# Patient Record
Sex: Female | Born: 1965
Health system: Southern US, Community
[De-identification: ages and names within clinical notes are randomized; demographics above are authoritative.]

## PROBLEM LIST (undated history)

## (undated) DIAGNOSIS — J869 Pyothorax without fistula: Secondary | ICD-10-CM

## (undated) DIAGNOSIS — I350 Nonrheumatic aortic (valve) stenosis: Secondary | ICD-10-CM

## (undated) DIAGNOSIS — F411 Generalized anxiety disorder: Secondary | ICD-10-CM

## (undated) DIAGNOSIS — E038 Other specified hypothyroidism: Secondary | ICD-10-CM

## (undated) DIAGNOSIS — M1990 Unspecified osteoarthritis, unspecified site: Secondary | ICD-10-CM

## (undated) DIAGNOSIS — I2699 Other pulmonary embolism without acute cor pulmonale: Secondary | ICD-10-CM

## (undated) DIAGNOSIS — R599 Enlarged lymph nodes, unspecified: Secondary | ICD-10-CM

## (undated) DIAGNOSIS — D509 Iron deficiency anemia, unspecified: Secondary | ICD-10-CM

## (undated) DIAGNOSIS — R609 Edema, unspecified: Secondary | ICD-10-CM

## (undated) DIAGNOSIS — N059 Unspecified nephritic syndrome with unspecified morphologic changes: Secondary | ICD-10-CM

## (undated) DIAGNOSIS — E7800 Pure hypercholesterolemia, unspecified: Secondary | ICD-10-CM

## (undated) DIAGNOSIS — I10 Essential (primary) hypertension: Secondary | ICD-10-CM

## (undated) DIAGNOSIS — I82409 Acute embolism and thrombosis of unspecified deep veins of unspecified lower extremity: Secondary | ICD-10-CM

## (undated) DIAGNOSIS — J90 Pleural effusion, not elsewhere classified: Secondary | ICD-10-CM

## (undated) DIAGNOSIS — G4730 Sleep apnea, unspecified: Secondary | ICD-10-CM

## (undated) DIAGNOSIS — R011 Cardiac murmur, unspecified: Secondary | ICD-10-CM

## (undated) DIAGNOSIS — R945 Abnormal results of liver function studies: Secondary | ICD-10-CM

## (undated) DIAGNOSIS — F410 Panic disorder [episodic paroxysmal anxiety] without agoraphobia: Secondary | ICD-10-CM

## (undated) DIAGNOSIS — M329 Systemic lupus erythematosus, unspecified: Secondary | ICD-10-CM

## (undated) DIAGNOSIS — E669 Obesity, unspecified: Secondary | ICD-10-CM

## (undated) DIAGNOSIS — E785 Hyperlipidemia, unspecified: Secondary | ICD-10-CM

## (undated) DIAGNOSIS — K219 Gastro-esophageal reflux disease without esophagitis: Secondary | ICD-10-CM

## (undated) DIAGNOSIS — Z86718 Personal history of other venous thrombosis and embolism: Secondary | ICD-10-CM

## (undated) HISTORY — DX: Iron deficiency anemia, unspecified: D50.9

## (undated) HISTORY — DX: Pyothorax without fistula: J86.9

## (undated) HISTORY — DX: Unspecified nephritic syndrome with unspecified morphologic changes: N05.9

## (undated) HISTORY — DX: Obesity, unspecified: E66.9

## (undated) HISTORY — DX: Enlarged lymph nodes, unspecified: R59.9

## (undated) HISTORY — DX: Other specified hypothyroidism: E03.8

## (undated) HISTORY — DX: Nonrheumatic aortic (valve) stenosis: I35.0

## (undated) HISTORY — DX: Panic disorder (episodic paroxysmal anxiety): F41.0

## (undated) HISTORY — PX: CARDIAC VALVE REPLACEMENT: SHX585

## (undated) HISTORY — DX: Pleural effusion, not elsewhere classified: J90

## (undated) HISTORY — PX: WISDOM TOOTH EXTRACTION: SHX21

## (undated) HISTORY — DX: Personal history of other venous thrombosis and embolism: Z86.718

## (undated) HISTORY — DX: Other pulmonary embolism without acute cor pulmonale: I26.99

## (undated) HISTORY — DX: Hyperlipidemia, unspecified: E78.5

## (undated) HISTORY — DX: Generalized anxiety disorder: F41.1

## (undated) HISTORY — DX: Essential (primary) hypertension: I10

## (undated) HISTORY — DX: Abnormal results of liver function studies: R94.5

## (undated) HISTORY — DX: Pure hypercholesterolemia, unspecified: E78.00

## (undated) HISTORY — DX: Edema, unspecified: R60.9

## (undated) HISTORY — DX: Systemic lupus erythematosus, unspecified: M32.9

## (undated) HISTORY — DX: Cardiac murmur, unspecified: R01.1

## (undated) HISTORY — PX: OTHER SURGICAL HISTORY: SHX169

## (undated) HISTORY — PX: CARDIAC CATHETERIZATION: SHX172

---

## 1999-09-26 ENCOUNTER — Other Ambulatory Visit: Admission: RE | Admit: 1999-09-26 | Discharge: 1999-09-26 | Payer: Self-pay | Admitting: Family Medicine

## 2000-02-04 DIAGNOSIS — Z86718 Personal history of other venous thrombosis and embolism: Secondary | ICD-10-CM

## 2000-02-04 HISTORY — DX: Personal history of other venous thrombosis and embolism: Z86.718

## 2000-03-05 ENCOUNTER — Other Ambulatory Visit: Admission: RE | Admit: 2000-03-05 | Discharge: 2000-03-05 | Payer: Self-pay | Admitting: *Deleted

## 2000-08-03 ENCOUNTER — Ambulatory Visit (HOSPITAL_COMMUNITY): Admission: RE | Admit: 2000-08-03 | Discharge: 2000-08-03 | Payer: Self-pay | Admitting: *Deleted

## 2000-08-04 ENCOUNTER — Encounter (INDEPENDENT_AMBULATORY_CARE_PROVIDER_SITE_OTHER): Payer: Self-pay | Admitting: *Deleted

## 2000-08-04 ENCOUNTER — Inpatient Hospital Stay (HOSPITAL_COMMUNITY): Admission: AD | Admit: 2000-08-04 | Discharge: 2000-08-08 | Payer: Self-pay | Admitting: *Deleted

## 2000-09-10 ENCOUNTER — Other Ambulatory Visit: Admission: RE | Admit: 2000-09-10 | Discharge: 2000-09-10 | Payer: Self-pay | Admitting: *Deleted

## 2002-05-05 ENCOUNTER — Encounter: Payer: Self-pay | Admitting: Family Medicine

## 2002-05-05 LAB — CONVERTED CEMR LAB

## 2003-02-04 DIAGNOSIS — J90 Pleural effusion, not elsewhere classified: Secondary | ICD-10-CM

## 2003-02-04 HISTORY — DX: Pleural effusion, not elsewhere classified: J90

## 2003-06-07 ENCOUNTER — Inpatient Hospital Stay (HOSPITAL_COMMUNITY): Admission: EM | Admit: 2003-06-07 | Discharge: 2003-06-10 | Payer: Self-pay | Admitting: Emergency Medicine

## 2003-06-08 ENCOUNTER — Encounter: Payer: Self-pay | Admitting: Cardiology

## 2004-03-15 ENCOUNTER — Ambulatory Visit: Payer: Self-pay | Admitting: Family Medicine

## 2004-05-13 ENCOUNTER — Ambulatory Visit: Payer: Self-pay | Admitting: Family Medicine

## 2004-09-04 ENCOUNTER — Ambulatory Visit (HOSPITAL_COMMUNITY): Admission: RE | Admit: 2004-09-04 | Discharge: 2004-09-04 | Payer: Self-pay | Admitting: Nephrology

## 2004-09-06 ENCOUNTER — Encounter (INDEPENDENT_AMBULATORY_CARE_PROVIDER_SITE_OTHER): Payer: Self-pay | Admitting: *Deleted

## 2004-09-06 ENCOUNTER — Ambulatory Visit (HOSPITAL_COMMUNITY): Admission: RE | Admit: 2004-09-06 | Discharge: 2004-09-07 | Payer: Self-pay | Admitting: Nephrology

## 2004-10-04 HISTORY — PX: OTHER SURGICAL HISTORY: SHX169

## 2004-10-11 ENCOUNTER — Ambulatory Visit: Payer: Self-pay | Admitting: Family Medicine

## 2004-10-18 ENCOUNTER — Encounter: Admission: RE | Admit: 2004-10-18 | Discharge: 2004-10-18 | Payer: Self-pay | Admitting: Family Medicine

## 2004-10-22 ENCOUNTER — Ambulatory Visit: Payer: Self-pay | Admitting: Critical Care Medicine

## 2004-10-24 ENCOUNTER — Ambulatory Visit: Payer: Self-pay

## 2004-10-24 ENCOUNTER — Encounter (INDEPENDENT_AMBULATORY_CARE_PROVIDER_SITE_OTHER): Payer: Self-pay | Admitting: Specialist

## 2004-10-24 ENCOUNTER — Ambulatory Visit (HOSPITAL_COMMUNITY): Admission: RE | Admit: 2004-10-24 | Discharge: 2004-10-24 | Payer: Self-pay | Admitting: Critical Care Medicine

## 2004-10-28 ENCOUNTER — Emergency Department (HOSPITAL_COMMUNITY): Admission: EM | Admit: 2004-10-28 | Discharge: 2004-10-28 | Payer: Self-pay | Admitting: Emergency Medicine

## 2004-10-30 ENCOUNTER — Ambulatory Visit: Payer: Self-pay | Admitting: Family Medicine

## 2004-11-14 ENCOUNTER — Ambulatory Visit: Payer: Self-pay | Admitting: Critical Care Medicine

## 2004-12-31 ENCOUNTER — Ambulatory Visit: Payer: Self-pay | Admitting: Family Medicine

## 2005-01-07 ENCOUNTER — Ambulatory Visit: Payer: Self-pay | Admitting: Critical Care Medicine

## 2005-11-11 ENCOUNTER — Ambulatory Visit: Payer: Self-pay | Admitting: Family Medicine

## 2006-03-17 ENCOUNTER — Ambulatory Visit: Payer: Self-pay | Admitting: Family Medicine

## 2006-04-01 ENCOUNTER — Ambulatory Visit: Payer: Self-pay | Admitting: Family Medicine

## 2006-04-01 LAB — CONVERTED CEMR LAB
AST: 17 units/L (ref 0–37)
Direct LDL: 164.6 mg/dL

## 2006-04-06 ENCOUNTER — Ambulatory Visit: Payer: Self-pay | Admitting: Family Medicine

## 2006-04-10 ENCOUNTER — Ambulatory Visit: Payer: Self-pay | Admitting: Critical Care Medicine

## 2006-05-04 ENCOUNTER — Ambulatory Visit (HOSPITAL_BASED_OUTPATIENT_CLINIC_OR_DEPARTMENT_OTHER): Admission: RE | Admit: 2006-05-04 | Discharge: 2006-05-04 | Payer: Self-pay | Admitting: Critical Care Medicine

## 2006-05-12 ENCOUNTER — Encounter: Payer: Self-pay | Admitting: Critical Care Medicine

## 2006-05-12 ENCOUNTER — Ambulatory Visit: Payer: Self-pay | Admitting: Pulmonary Disease

## 2006-05-19 ENCOUNTER — Ambulatory Visit: Payer: Self-pay | Admitting: Pulmonary Disease

## 2006-06-09 ENCOUNTER — Encounter: Payer: Self-pay | Admitting: Family Medicine

## 2006-06-09 ENCOUNTER — Ambulatory Visit: Payer: Self-pay | Admitting: Family Medicine

## 2006-06-09 ENCOUNTER — Encounter: Payer: Self-pay | Admitting: Critical Care Medicine

## 2006-06-09 ENCOUNTER — Ambulatory Visit (HOSPITAL_BASED_OUTPATIENT_CLINIC_OR_DEPARTMENT_OTHER): Admission: RE | Admit: 2006-06-09 | Discharge: 2006-06-09 | Payer: Self-pay | Admitting: Pulmonary Disease

## 2006-06-09 ENCOUNTER — Ambulatory Visit: Payer: Self-pay | Admitting: Pulmonary Disease

## 2006-06-09 DIAGNOSIS — N059 Unspecified nephritic syndrome with unspecified morphologic changes: Secondary | ICD-10-CM | POA: Insufficient documentation

## 2006-06-09 DIAGNOSIS — Z86718 Personal history of other venous thrombosis and embolism: Secondary | ICD-10-CM | POA: Insufficient documentation

## 2006-06-09 DIAGNOSIS — E7800 Pure hypercholesterolemia, unspecified: Secondary | ICD-10-CM | POA: Insufficient documentation

## 2006-06-09 DIAGNOSIS — R945 Abnormal results of liver function studies: Secondary | ICD-10-CM | POA: Insufficient documentation

## 2006-06-09 DIAGNOSIS — F410 Panic disorder [episodic paroxysmal anxiety] without agoraphobia: Secondary | ICD-10-CM | POA: Insufficient documentation

## 2006-06-09 DIAGNOSIS — M329 Systemic lupus erythematosus, unspecified: Secondary | ICD-10-CM | POA: Insufficient documentation

## 2006-06-09 DIAGNOSIS — I10 Essential (primary) hypertension: Secondary | ICD-10-CM | POA: Insufficient documentation

## 2006-06-09 DIAGNOSIS — F411 Generalized anxiety disorder: Secondary | ICD-10-CM | POA: Insufficient documentation

## 2006-06-09 DIAGNOSIS — E039 Hypothyroidism, unspecified: Secondary | ICD-10-CM | POA: Insufficient documentation

## 2006-06-11 ENCOUNTER — Ambulatory Visit: Payer: Self-pay | Admitting: Family Medicine

## 2006-06-17 LAB — CONVERTED CEMR LAB
AST: 17 units/L (ref 0–37)
Cholesterol: 181 mg/dL (ref 0–200)
Free T4: 0.7 ng/dL (ref 0.6–1.6)
TSH: 17.85 microintl units/mL — ABNORMAL HIGH (ref 0.35–5.50)

## 2006-07-17 ENCOUNTER — Encounter (INDEPENDENT_AMBULATORY_CARE_PROVIDER_SITE_OTHER): Payer: Self-pay | Admitting: *Deleted

## 2006-11-30 ENCOUNTER — Ambulatory Visit: Payer: Self-pay | Admitting: Family Medicine

## 2006-11-30 LAB — CONVERTED CEMR LAB
Nitrite: NEGATIVE
Urobilinogen, UA: NEGATIVE

## 2006-12-01 ENCOUNTER — Encounter (INDEPENDENT_AMBULATORY_CARE_PROVIDER_SITE_OTHER): Payer: Self-pay | Admitting: Internal Medicine

## 2006-12-10 ENCOUNTER — Encounter: Payer: Self-pay | Admitting: Family Medicine

## 2006-12-17 ENCOUNTER — Encounter: Payer: Self-pay | Admitting: Family Medicine

## 2007-02-16 ENCOUNTER — Encounter: Payer: Self-pay | Admitting: Family Medicine

## 2007-04-26 ENCOUNTER — Encounter (INDEPENDENT_AMBULATORY_CARE_PROVIDER_SITE_OTHER): Payer: Self-pay | Admitting: *Deleted

## 2007-04-26 ENCOUNTER — Telehealth: Payer: Self-pay | Admitting: Family Medicine

## 2007-06-04 ENCOUNTER — Telehealth: Payer: Self-pay | Admitting: Family Medicine

## 2007-07-07 ENCOUNTER — Telehealth: Payer: Self-pay | Admitting: Family Medicine

## 2007-08-18 ENCOUNTER — Telehealth (INDEPENDENT_AMBULATORY_CARE_PROVIDER_SITE_OTHER): Payer: Self-pay | Admitting: *Deleted

## 2007-09-16 ENCOUNTER — Ambulatory Visit: Payer: Self-pay | Admitting: Family Medicine

## 2007-09-22 ENCOUNTER — Telehealth (INDEPENDENT_AMBULATORY_CARE_PROVIDER_SITE_OTHER): Payer: Self-pay | Admitting: *Deleted

## 2007-10-14 ENCOUNTER — Encounter: Payer: Self-pay | Admitting: Critical Care Medicine

## 2007-10-14 ENCOUNTER — Encounter: Payer: Self-pay | Admitting: Family Medicine

## 2007-12-06 ENCOUNTER — Telehealth: Payer: Self-pay | Admitting: Family Medicine

## 2007-12-06 ENCOUNTER — Encounter: Payer: Self-pay | Admitting: Family Medicine

## 2007-12-08 ENCOUNTER — Ambulatory Visit: Payer: Self-pay | Admitting: Family Medicine

## 2007-12-15 LAB — CONVERTED CEMR LAB
LDL Cholesterol: 145 mg/dL — ABNORMAL HIGH (ref 0–99)
TSH: 0.1 microintl units/mL — ABNORMAL LOW (ref 0.35–5.50)
Total CHOL/HDL Ratio: 9.2
Triglycerides: 117 mg/dL (ref 0–149)

## 2008-02-04 HISTORY — PX: THORACENTESIS: SHX235

## 2008-02-15 ENCOUNTER — Telehealth: Payer: Self-pay | Admitting: Family Medicine

## 2008-04-06 ENCOUNTER — Ambulatory Visit: Payer: Self-pay | Admitting: Internal Medicine

## 2008-04-06 ENCOUNTER — Inpatient Hospital Stay (HOSPITAL_COMMUNITY): Admission: EM | Admit: 2008-04-06 | Discharge: 2008-04-08 | Payer: Self-pay | Admitting: Emergency Medicine

## 2008-04-06 ENCOUNTER — Encounter: Payer: Self-pay | Admitting: Internal Medicine

## 2008-04-06 ENCOUNTER — Ambulatory Visit: Payer: Self-pay | Admitting: Cardiothoracic Surgery

## 2008-04-06 DIAGNOSIS — J869 Pyothorax without fistula: Secondary | ICD-10-CM | POA: Insufficient documentation

## 2008-04-07 ENCOUNTER — Encounter: Payer: Self-pay | Admitting: Family Medicine

## 2008-04-07 ENCOUNTER — Encounter (INDEPENDENT_AMBULATORY_CARE_PROVIDER_SITE_OTHER): Payer: Self-pay | Admitting: Interventional Radiology

## 2008-04-08 ENCOUNTER — Encounter: Payer: Self-pay | Admitting: Family Medicine

## 2008-04-12 ENCOUNTER — Ambulatory Visit: Payer: Self-pay | Admitting: Family Medicine

## 2008-04-14 LAB — CONVERTED CEMR LAB
Free T4: 1.4 ng/dL (ref 0.6–1.6)
TSH: 0.06 microintl units/mL — ABNORMAL LOW (ref 0.35–5.50)

## 2008-04-17 ENCOUNTER — Encounter: Payer: Self-pay | Admitting: Family Medicine

## 2008-04-17 ENCOUNTER — Ambulatory Visit: Payer: Self-pay | Admitting: Critical Care Medicine

## 2008-04-21 ENCOUNTER — Ambulatory Visit: Payer: Self-pay | Admitting: Cardiothoracic Surgery

## 2008-04-21 ENCOUNTER — Encounter: Payer: Self-pay | Admitting: Family Medicine

## 2008-04-21 ENCOUNTER — Emergency Department (HOSPITAL_COMMUNITY): Admission: EM | Admit: 2008-04-21 | Discharge: 2008-04-21 | Payer: Self-pay | Admitting: Emergency Medicine

## 2008-04-21 ENCOUNTER — Encounter: Admission: RE | Admit: 2008-04-21 | Discharge: 2008-04-21 | Payer: Self-pay | Admitting: Cardiothoracic Surgery

## 2008-04-24 ENCOUNTER — Encounter: Payer: Self-pay | Admitting: Family Medicine

## 2008-04-25 ENCOUNTER — Ambulatory Visit: Payer: Self-pay | Admitting: Family Medicine

## 2008-04-26 ENCOUNTER — Telehealth (INDEPENDENT_AMBULATORY_CARE_PROVIDER_SITE_OTHER): Payer: Self-pay | Admitting: *Deleted

## 2008-05-04 ENCOUNTER — Encounter: Payer: Self-pay | Admitting: Critical Care Medicine

## 2008-05-04 ENCOUNTER — Encounter: Admission: RE | Admit: 2008-05-04 | Discharge: 2008-05-04 | Payer: Self-pay | Admitting: Cardiothoracic Surgery

## 2008-05-04 ENCOUNTER — Ambulatory Visit: Payer: Self-pay | Admitting: Cardiothoracic Surgery

## 2008-05-10 ENCOUNTER — Encounter: Payer: Self-pay | Admitting: Critical Care Medicine

## 2008-05-18 ENCOUNTER — Ambulatory Visit: Payer: Self-pay | Admitting: Cardiothoracic Surgery

## 2008-05-24 ENCOUNTER — Inpatient Hospital Stay (HOSPITAL_COMMUNITY): Admission: RE | Admit: 2008-05-24 | Discharge: 2008-05-30 | Payer: Self-pay | Admitting: Cardiothoracic Surgery

## 2008-05-24 ENCOUNTER — Encounter: Payer: Self-pay | Admitting: Cardiothoracic Surgery

## 2008-05-24 ENCOUNTER — Encounter (INDEPENDENT_AMBULATORY_CARE_PROVIDER_SITE_OTHER): Payer: Self-pay | Admitting: Interventional Radiology

## 2008-05-24 ENCOUNTER — Ambulatory Visit: Payer: Self-pay | Admitting: Cardiothoracic Surgery

## 2008-06-07 ENCOUNTER — Encounter (INDEPENDENT_AMBULATORY_CARE_PROVIDER_SITE_OTHER): Payer: Self-pay | Admitting: *Deleted

## 2008-06-16 ENCOUNTER — Ambulatory Visit: Payer: Self-pay | Admitting: Cardiothoracic Surgery

## 2008-06-16 ENCOUNTER — Encounter: Admission: RE | Admit: 2008-06-16 | Discharge: 2008-06-16 | Payer: Self-pay | Admitting: Cardiothoracic Surgery

## 2008-06-30 ENCOUNTER — Ambulatory Visit: Payer: Self-pay | Admitting: Cardiothoracic Surgery

## 2008-06-30 ENCOUNTER — Encounter: Payer: Self-pay | Admitting: Critical Care Medicine

## 2008-06-30 ENCOUNTER — Encounter: Admission: RE | Admit: 2008-06-30 | Discharge: 2008-06-30 | Payer: Self-pay | Admitting: Cardiothoracic Surgery

## 2008-07-18 ENCOUNTER — Ambulatory Visit: Payer: Self-pay | Admitting: Family Medicine

## 2008-07-20 ENCOUNTER — Ambulatory Visit: Payer: Self-pay | Admitting: Critical Care Medicine

## 2008-08-22 ENCOUNTER — Encounter: Payer: Self-pay | Admitting: Critical Care Medicine

## 2008-09-11 ENCOUNTER — Encounter (INDEPENDENT_AMBULATORY_CARE_PROVIDER_SITE_OTHER): Payer: Self-pay | Admitting: *Deleted

## 2008-12-19 ENCOUNTER — Telehealth: Payer: Self-pay | Admitting: Family Medicine

## 2008-12-20 ENCOUNTER — Encounter: Payer: Self-pay | Admitting: Critical Care Medicine

## 2008-12-21 ENCOUNTER — Telehealth (INDEPENDENT_AMBULATORY_CARE_PROVIDER_SITE_OTHER): Payer: Self-pay | Admitting: *Deleted

## 2008-12-25 ENCOUNTER — Ambulatory Visit: Payer: Self-pay | Admitting: Family Medicine

## 2009-01-03 LAB — CONVERTED CEMR LAB
Cholesterol: 231 mg/dL — ABNORMAL HIGH (ref 0–200)
TSH: 4.38 microintl units/mL (ref 0.35–5.50)
Total CHOL/HDL Ratio: 7
VLDL: 23 mg/dL (ref 0.0–40.0)

## 2009-04-18 ENCOUNTER — Emergency Department (HOSPITAL_BASED_OUTPATIENT_CLINIC_OR_DEPARTMENT_OTHER): Admission: EM | Admit: 2009-04-18 | Discharge: 2009-04-18 | Payer: Self-pay | Admitting: Emergency Medicine

## 2009-04-18 ENCOUNTER — Ambulatory Visit: Payer: Self-pay | Admitting: Radiology

## 2009-05-08 ENCOUNTER — Ambulatory Visit: Payer: Self-pay | Admitting: Family Medicine

## 2009-05-08 ENCOUNTER — Encounter: Admission: RE | Admit: 2009-05-08 | Discharge: 2009-05-08 | Payer: Self-pay | Admitting: Family Medicine

## 2009-05-11 ENCOUNTER — Telehealth: Payer: Self-pay | Admitting: Family Medicine

## 2009-05-14 ENCOUNTER — Telehealth: Payer: Self-pay | Admitting: Family Medicine

## 2009-05-16 ENCOUNTER — Inpatient Hospital Stay (HOSPITAL_COMMUNITY): Admission: EM | Admit: 2009-05-16 | Discharge: 2009-05-17 | Payer: Self-pay | Admitting: Internal Medicine

## 2009-05-16 ENCOUNTER — Encounter: Payer: Self-pay | Admitting: Emergency Medicine

## 2009-05-16 ENCOUNTER — Ambulatory Visit: Payer: Self-pay | Admitting: Diagnostic Radiology

## 2009-05-17 ENCOUNTER — Encounter (INDEPENDENT_AMBULATORY_CARE_PROVIDER_SITE_OTHER): Payer: Self-pay | Admitting: Internal Medicine

## 2009-05-17 ENCOUNTER — Ambulatory Visit: Payer: Self-pay | Admitting: Surgery

## 2009-05-18 ENCOUNTER — Telehealth: Payer: Self-pay | Admitting: Family Medicine

## 2009-05-19 ENCOUNTER — Encounter: Payer: Self-pay | Admitting: Family Medicine

## 2009-05-22 LAB — CONVERTED CEMR LAB: INR: 1.01 (ref <1.50)

## 2009-05-24 ENCOUNTER — Encounter: Payer: Self-pay | Admitting: Family Medicine

## 2009-05-24 ENCOUNTER — Ambulatory Visit: Payer: Self-pay | Admitting: Family Medicine

## 2009-05-24 DIAGNOSIS — Z86711 Personal history of pulmonary embolism: Secondary | ICD-10-CM | POA: Insufficient documentation

## 2009-05-27 ENCOUNTER — Emergency Department (HOSPITAL_COMMUNITY): Admission: EM | Admit: 2009-05-27 | Discharge: 2009-05-27 | Payer: Self-pay | Admitting: Emergency Medicine

## 2009-05-28 ENCOUNTER — Telehealth: Payer: Self-pay | Admitting: Family Medicine

## 2009-05-28 ENCOUNTER — Telehealth: Payer: Self-pay | Admitting: Critical Care Medicine

## 2009-05-29 ENCOUNTER — Ambulatory Visit: Payer: Self-pay | Admitting: Critical Care Medicine

## 2009-05-29 ENCOUNTER — Ambulatory Visit: Payer: Self-pay | Admitting: Family Medicine

## 2009-05-29 LAB — CONVERTED CEMR LAB
INR: 1.7
Prothrombin Time: 21 s

## 2009-06-01 ENCOUNTER — Ambulatory Visit: Payer: Self-pay | Admitting: Family Medicine

## 2009-06-01 LAB — CONVERTED CEMR LAB: Prothrombin Time: 34.6 s

## 2009-06-04 ENCOUNTER — Encounter: Payer: Self-pay | Admitting: Family Medicine

## 2009-06-06 ENCOUNTER — Telehealth: Payer: Self-pay | Admitting: Critical Care Medicine

## 2009-06-06 ENCOUNTER — Telehealth: Payer: Self-pay | Admitting: Family Medicine

## 2009-06-07 ENCOUNTER — Encounter: Payer: Self-pay | Admitting: Critical Care Medicine

## 2009-06-11 ENCOUNTER — Ambulatory Visit: Payer: Self-pay | Admitting: Family Medicine

## 2009-06-11 ENCOUNTER — Telehealth: Payer: Self-pay | Admitting: Family Medicine

## 2009-06-11 LAB — CONVERTED CEMR LAB: Prothrombin Time: 30.1 s

## 2009-06-13 ENCOUNTER — Telehealth: Payer: Self-pay | Admitting: Family Medicine

## 2009-06-15 ENCOUNTER — Telehealth: Payer: Self-pay | Admitting: Family Medicine

## 2009-06-15 ENCOUNTER — Telehealth: Payer: Self-pay | Admitting: Critical Care Medicine

## 2009-06-18 ENCOUNTER — Encounter: Payer: Self-pay | Admitting: Family Medicine

## 2009-06-20 ENCOUNTER — Encounter: Payer: Self-pay | Admitting: Family Medicine

## 2009-06-20 ENCOUNTER — Encounter: Payer: Self-pay | Admitting: Critical Care Medicine

## 2009-06-20 ENCOUNTER — Telehealth: Payer: Self-pay | Admitting: Family Medicine

## 2009-07-09 ENCOUNTER — Ambulatory Visit: Payer: Self-pay | Admitting: Family Medicine

## 2009-07-12 ENCOUNTER — Encounter: Payer: Self-pay | Admitting: Family Medicine

## 2009-07-12 LAB — CONVERTED CEMR LAB
ALT: 29 units/L (ref 0–35)
AST: 17 units/L (ref 0–37)
Albumin: 3.4 g/dL — ABNORMAL LOW (ref 3.5–5.2)
BUN: 22 mg/dL (ref 6–23)
CO2: 32 meq/L (ref 19–32)
Calcium: 9.2 mg/dL (ref 8.4–10.5)
Chloride: 105 meq/L (ref 96–112)
Cholesterol: 300 mg/dL — ABNORMAL HIGH (ref 0–200)
Creatinine, Ser: 0.8 mg/dL (ref 0.4–1.2)
Direct LDL: 241.9 mg/dL
GFR calc non Af Amer: 79.77 mL/min (ref >60)
Glucose, Bld: 97 mg/dL (ref 70–99)
HDL: 49.6 mg/dL (ref >39.00)
Phosphorus: 4.4 mg/dL (ref 2.3–4.6)
Potassium: 5.3 meq/L — ABNORMAL HIGH (ref 3.5–5.1)
Sodium: 142 meq/L (ref 135–145)
TSH: 0.45 microintl units/mL (ref 0.35–5.50)
Total CHOL/HDL Ratio: 6
Triglycerides: 110 mg/dL (ref 0.0–149.0)
VLDL: 22 mg/dL (ref 0.0–40.0)

## 2009-07-16 ENCOUNTER — Ambulatory Visit: Payer: Self-pay | Admitting: Family Medicine

## 2009-07-17 ENCOUNTER — Ambulatory Visit (HOSPITAL_COMMUNITY): Admission: RE | Admit: 2009-07-17 | Discharge: 2009-07-17 | Payer: Self-pay | Admitting: Critical Care Medicine

## 2009-07-20 ENCOUNTER — Encounter: Payer: Self-pay | Admitting: Family Medicine

## 2009-07-20 ENCOUNTER — Ambulatory Visit (HOSPITAL_COMMUNITY): Admission: RE | Admit: 2009-07-20 | Discharge: 2009-07-20 | Payer: Self-pay | Admitting: Family Medicine

## 2009-07-31 ENCOUNTER — Telehealth: Payer: Self-pay | Admitting: Family Medicine

## 2009-08-11 ENCOUNTER — Ambulatory Visit: Payer: Self-pay | Admitting: Interventional Radiology

## 2009-08-11 ENCOUNTER — Encounter: Payer: Self-pay | Admitting: Family Medicine

## 2009-08-11 ENCOUNTER — Emergency Department (HOSPITAL_BASED_OUTPATIENT_CLINIC_OR_DEPARTMENT_OTHER): Admission: EM | Admit: 2009-08-11 | Discharge: 2009-08-11 | Payer: Self-pay | Admitting: Emergency Medicine

## 2009-08-13 ENCOUNTER — Ambulatory Visit: Payer: Self-pay | Admitting: Family Medicine

## 2009-08-13 ENCOUNTER — Telehealth: Payer: Self-pay | Admitting: Family Medicine

## 2009-08-13 DIAGNOSIS — R599 Enlarged lymph nodes, unspecified: Secondary | ICD-10-CM | POA: Insufficient documentation

## 2009-08-13 LAB — CONVERTED CEMR LAB
INR: 1.5
Prothrombin Time: 17.4 s

## 2009-08-15 ENCOUNTER — Ambulatory Visit: Payer: Self-pay | Admitting: Critical Care Medicine

## 2009-08-17 ENCOUNTER — Telehealth: Payer: Self-pay | Admitting: Family Medicine

## 2009-08-17 ENCOUNTER — Ambulatory Visit: Payer: Self-pay | Admitting: Family Medicine

## 2009-08-17 LAB — CONVERTED CEMR LAB: Prothrombin Time: 30 s

## 2009-08-20 ENCOUNTER — Encounter: Payer: Self-pay | Admitting: Critical Care Medicine

## 2009-08-23 ENCOUNTER — Telehealth (INDEPENDENT_AMBULATORY_CARE_PROVIDER_SITE_OTHER): Payer: Self-pay | Admitting: *Deleted

## 2009-08-24 ENCOUNTER — Ambulatory Visit: Payer: Self-pay | Admitting: Family Medicine

## 2009-08-24 ENCOUNTER — Telehealth (INDEPENDENT_AMBULATORY_CARE_PROVIDER_SITE_OTHER): Payer: Self-pay | Admitting: *Deleted

## 2009-08-24 LAB — CONVERTED CEMR LAB
INR: 2.9
Prothrombin Time: 34.8 s

## 2009-08-27 ENCOUNTER — Encounter: Payer: Self-pay | Admitting: Internal Medicine

## 2009-08-27 ENCOUNTER — Ambulatory Visit: Payer: Self-pay | Admitting: Internal Medicine

## 2009-08-27 ENCOUNTER — Emergency Department (HOSPITAL_BASED_OUTPATIENT_CLINIC_OR_DEPARTMENT_OTHER): Admission: EM | Admit: 2009-08-27 | Discharge: 2009-08-27 | Payer: Self-pay | Admitting: Emergency Medicine

## 2009-08-27 ENCOUNTER — Ambulatory Visit: Payer: Self-pay | Admitting: Radiology

## 2009-08-27 DIAGNOSIS — J90 Pleural effusion, not elsewhere classified: Secondary | ICD-10-CM | POA: Insufficient documentation

## 2009-08-31 ENCOUNTER — Telehealth (INDEPENDENT_AMBULATORY_CARE_PROVIDER_SITE_OTHER): Payer: Self-pay | Admitting: *Deleted

## 2009-08-31 ENCOUNTER — Encounter: Payer: Self-pay | Admitting: Pulmonary Disease

## 2009-08-31 ENCOUNTER — Ambulatory Visit: Payer: Self-pay | Admitting: Internal Medicine

## 2009-09-03 ENCOUNTER — Ambulatory Visit: Payer: Self-pay | Admitting: Internal Medicine

## 2009-09-04 LAB — CONVERTED CEMR LAB
BUN: 13 mg/dL (ref 6–23)
Bilirubin, Direct: 0.1 mg/dL (ref 0.0–0.3)
Calcium: 8.7 mg/dL (ref 8.4–10.5)
Chloride: 103 meq/L (ref 96–112)
Creatinine, Ser: 0.9 mg/dL (ref 0.4–1.2)
Eosinophils Absolute: 0.1 10*3/uL (ref 0.0–0.7)
Eosinophils Relative: 1.5 % (ref 0.0–5.0)
MCHC: 33 g/dL (ref 30.0–36.0)
MCV: 81.9 fL (ref 78.0–100.0)
Monocytes Absolute: 0.5 10*3/uL (ref 0.1–1.0)
Neutrophils Relative %: 78.7 % — ABNORMAL HIGH (ref 43.0–77.0)
Platelets: 327 10*3/uL (ref 150.0–400.0)
Pro B Natriuretic peptide (BNP): 24.9 pg/mL (ref 0.0–100.0)
Sed Rate: 35 mm/hr — ABNORMAL HIGH (ref 0–22)
Total Bilirubin: 0.7 mg/dL (ref 0.3–1.2)
WBC: 9.8 10*3/uL (ref 4.5–10.5)

## 2009-09-07 ENCOUNTER — Ambulatory Visit: Payer: Self-pay | Admitting: Family Medicine

## 2009-09-07 LAB — CONVERTED CEMR LAB: INR: 1.9

## 2009-09-11 ENCOUNTER — Telehealth: Payer: Self-pay | Admitting: Internal Medicine

## 2009-09-14 ENCOUNTER — Ambulatory Visit: Payer: Self-pay | Admitting: Critical Care Medicine

## 2009-09-14 ENCOUNTER — Ambulatory Visit: Payer: Self-pay | Admitting: Family Medicine

## 2009-09-14 LAB — CONVERTED CEMR LAB
ANA Titer 1: 1:320 {titer} — ABNORMAL HIGH
Anti Nuclear Antibody(ANA): POSITIVE — AB
Basophils Absolute: 0.1 10*3/uL (ref 0.0–0.1)
Eosinophils Absolute: 0.2 10*3/uL (ref 0.0–0.7)
Lymphocytes Relative: 19.8 % (ref 12.0–46.0)
MCHC: 33.3 g/dL (ref 30.0–36.0)
Neutrophils Relative %: 71.2 % (ref 43.0–77.0)
Prothrombin Time: 22.9 s
RDW: 16.9 % — ABNORMAL HIGH (ref 11.5–14.6)

## 2009-09-16 ENCOUNTER — Telehealth: Payer: Self-pay | Admitting: Critical Care Medicine

## 2009-09-17 ENCOUNTER — Telehealth: Payer: Self-pay | Admitting: Family Medicine

## 2009-09-17 ENCOUNTER — Ambulatory Visit (HOSPITAL_COMMUNITY): Admission: RE | Admit: 2009-09-17 | Discharge: 2009-09-17 | Payer: Self-pay | Admitting: Critical Care Medicine

## 2009-09-19 ENCOUNTER — Ambulatory Visit: Payer: Self-pay | Admitting: Family Medicine

## 2009-09-20 ENCOUNTER — Ambulatory Visit (HOSPITAL_BASED_OUTPATIENT_CLINIC_OR_DEPARTMENT_OTHER): Admission: RE | Admit: 2009-09-20 | Discharge: 2009-09-20 | Payer: Self-pay | Admitting: Critical Care Medicine

## 2009-09-20 ENCOUNTER — Ambulatory Visit: Payer: Self-pay | Admitting: Diagnostic Radiology

## 2009-09-20 ENCOUNTER — Ambulatory Visit: Payer: Self-pay | Admitting: Critical Care Medicine

## 2009-09-21 ENCOUNTER — Telehealth (INDEPENDENT_AMBULATORY_CARE_PROVIDER_SITE_OTHER): Payer: Self-pay | Admitting: *Deleted

## 2009-09-21 ENCOUNTER — Telehealth: Payer: Self-pay | Admitting: Critical Care Medicine

## 2009-09-24 ENCOUNTER — Telehealth (INDEPENDENT_AMBULATORY_CARE_PROVIDER_SITE_OTHER): Payer: Self-pay | Admitting: *Deleted

## 2009-09-25 ENCOUNTER — Telehealth: Payer: Self-pay | Admitting: Critical Care Medicine

## 2009-09-26 ENCOUNTER — Ambulatory Visit: Payer: Self-pay | Admitting: Cardiothoracic Surgery

## 2009-09-26 ENCOUNTER — Encounter: Admission: RE | Admit: 2009-09-26 | Discharge: 2009-09-26 | Payer: Self-pay | Admitting: Cardiothoracic Surgery

## 2009-09-27 ENCOUNTER — Ambulatory Visit (HOSPITAL_COMMUNITY): Admission: RE | Admit: 2009-09-27 | Discharge: 2009-09-27 | Payer: Self-pay | Admitting: Cardiothoracic Surgery

## 2009-09-27 ENCOUNTER — Encounter: Payer: Self-pay | Admitting: Cardiothoracic Surgery

## 2009-09-27 ENCOUNTER — Encounter: Payer: Self-pay | Admitting: Critical Care Medicine

## 2009-10-01 ENCOUNTER — Encounter: Payer: Self-pay | Admitting: Cardiothoracic Surgery

## 2009-10-01 ENCOUNTER — Inpatient Hospital Stay (HOSPITAL_COMMUNITY): Admission: RE | Admit: 2009-10-01 | Discharge: 2009-10-09 | Payer: Self-pay | Admitting: Cardiothoracic Surgery

## 2009-10-01 ENCOUNTER — Ambulatory Visit: Payer: Self-pay | Admitting: Internal Medicine

## 2009-10-01 ENCOUNTER — Ambulatory Visit: Payer: Self-pay | Admitting: Cardiothoracic Surgery

## 2009-10-02 ENCOUNTER — Encounter: Payer: Self-pay | Admitting: Internal Medicine

## 2009-10-04 HISTORY — PX: OTHER SURGICAL HISTORY: SHX169

## 2009-10-11 ENCOUNTER — Ambulatory Visit: Payer: Self-pay | Admitting: Family Medicine

## 2009-10-11 LAB — CONVERTED CEMR LAB: Prothrombin Time: 27.5 s

## 2009-10-17 ENCOUNTER — Encounter: Admission: RE | Admit: 2009-10-17 | Discharge: 2009-10-17 | Payer: Self-pay | Admitting: Cardiothoracic Surgery

## 2009-10-17 ENCOUNTER — Ambulatory Visit: Payer: Self-pay | Admitting: Cardiothoracic Surgery

## 2009-10-18 ENCOUNTER — Encounter: Payer: Self-pay | Admitting: Critical Care Medicine

## 2009-10-19 ENCOUNTER — Ambulatory Visit: Payer: Self-pay | Admitting: Family Medicine

## 2009-10-19 LAB — CONVERTED CEMR LAB: Prothrombin Time: 65.9 s

## 2009-10-22 ENCOUNTER — Ambulatory Visit: Payer: Self-pay | Admitting: Family Medicine

## 2009-10-22 ENCOUNTER — Ambulatory Visit: Payer: Self-pay | Admitting: Critical Care Medicine

## 2009-10-22 LAB — CONVERTED CEMR LAB: INR: 2.3

## 2009-10-24 ENCOUNTER — Ambulatory Visit: Payer: Self-pay | Admitting: Cardiothoracic Surgery

## 2009-10-24 ENCOUNTER — Encounter: Admission: RE | Admit: 2009-10-24 | Discharge: 2009-10-24 | Payer: Self-pay | Admitting: Cardiothoracic Surgery

## 2009-10-26 ENCOUNTER — Ambulatory Visit (HOSPITAL_COMMUNITY): Admission: RE | Admit: 2009-10-26 | Discharge: 2009-10-26 | Payer: Self-pay | Admitting: Cardiothoracic Surgery

## 2009-10-26 ENCOUNTER — Ambulatory Visit: Payer: Self-pay | Admitting: Family Medicine

## 2009-10-26 ENCOUNTER — Telehealth (INDEPENDENT_AMBULATORY_CARE_PROVIDER_SITE_OTHER): Payer: Self-pay | Admitting: *Deleted

## 2009-10-26 LAB — CONVERTED CEMR LAB
INR: 2.8
Prothrombin Time: 33.5 s

## 2009-11-02 ENCOUNTER — Ambulatory Visit: Payer: Self-pay | Admitting: Cardiothoracic Surgery

## 2009-11-02 ENCOUNTER — Encounter: Admission: RE | Admit: 2009-11-02 | Discharge: 2009-11-02 | Payer: Self-pay | Admitting: Cardiothoracic Surgery

## 2009-11-08 ENCOUNTER — Encounter: Payer: Self-pay | Admitting: Family Medicine

## 2009-11-15 ENCOUNTER — Telehealth (INDEPENDENT_AMBULATORY_CARE_PROVIDER_SITE_OTHER): Payer: Self-pay | Admitting: *Deleted

## 2009-11-19 ENCOUNTER — Ambulatory Visit: Payer: Self-pay | Admitting: Internal Medicine

## 2009-11-19 ENCOUNTER — Encounter: Payer: Self-pay | Admitting: Adult Health

## 2009-11-19 ENCOUNTER — Ambulatory Visit: Payer: Self-pay | Admitting: Critical Care Medicine

## 2009-11-19 ENCOUNTER — Ambulatory Visit: Payer: Self-pay | Admitting: Cardiothoracic Surgery

## 2009-11-20 ENCOUNTER — Encounter: Payer: Self-pay | Admitting: Critical Care Medicine

## 2009-11-20 LAB — CONVERTED CEMR LAB
BUN: 12 mg/dL (ref 6–23)
Basophils Absolute: 0.1 10*3/uL (ref 0.0–0.1)
CO2: 26 meq/L (ref 19–32)
Calcium: 9 mg/dL (ref 8.4–10.5)
Creatinine, Ser: 0.9 mg/dL (ref 0.4–1.2)
Eosinophils Absolute: 0.1 10*3/uL (ref 0.0–0.7)
Eosinophils Relative: 0.6 % (ref 0.0–5.0)
INR: 1.6 — ABNORMAL HIGH (ref 0.8–1.0)
MCHC: 32.7 g/dL (ref 30.0–36.0)
MCV: 80.8 fL (ref 78.0–100.0)
Monocytes Absolute: 0.3 10*3/uL (ref 0.1–1.0)
Neutrophils Relative %: 83.6 % — ABNORMAL HIGH (ref 43.0–77.0)
Platelets: 271 10*3/uL (ref 150.0–400.0)
Prothrombin Time: 16.6 s — ABNORMAL HIGH (ref 9.7–11.8)
RDW: 16.3 % — ABNORMAL HIGH (ref 11.5–14.6)
WBC: 8.6 10*3/uL (ref 4.5–10.5)

## 2009-11-22 ENCOUNTER — Ambulatory Visit: Payer: Self-pay | Admitting: Critical Care Medicine

## 2009-11-23 ENCOUNTER — Ambulatory Visit: Payer: Self-pay | Admitting: Family Medicine

## 2009-11-30 ENCOUNTER — Encounter (HOSPITAL_COMMUNITY)
Admission: RE | Admit: 2009-11-30 | Discharge: 2010-02-28 | Payer: Self-pay | Source: Home / Self Care | Attending: Nephrology | Admitting: Nephrology

## 2009-12-04 ENCOUNTER — Ambulatory Visit: Payer: Self-pay | Admitting: Family Medicine

## 2009-12-06 IMAGING — CR DG CHEST 2V
2 series · 2 of 2 positions shown · non-contrast
Comparison: 05/30/2008

CLINICAL DATA: Empyema.  Status post chest tube removal.

CHEST - 2 VIEW

[w chest pa]
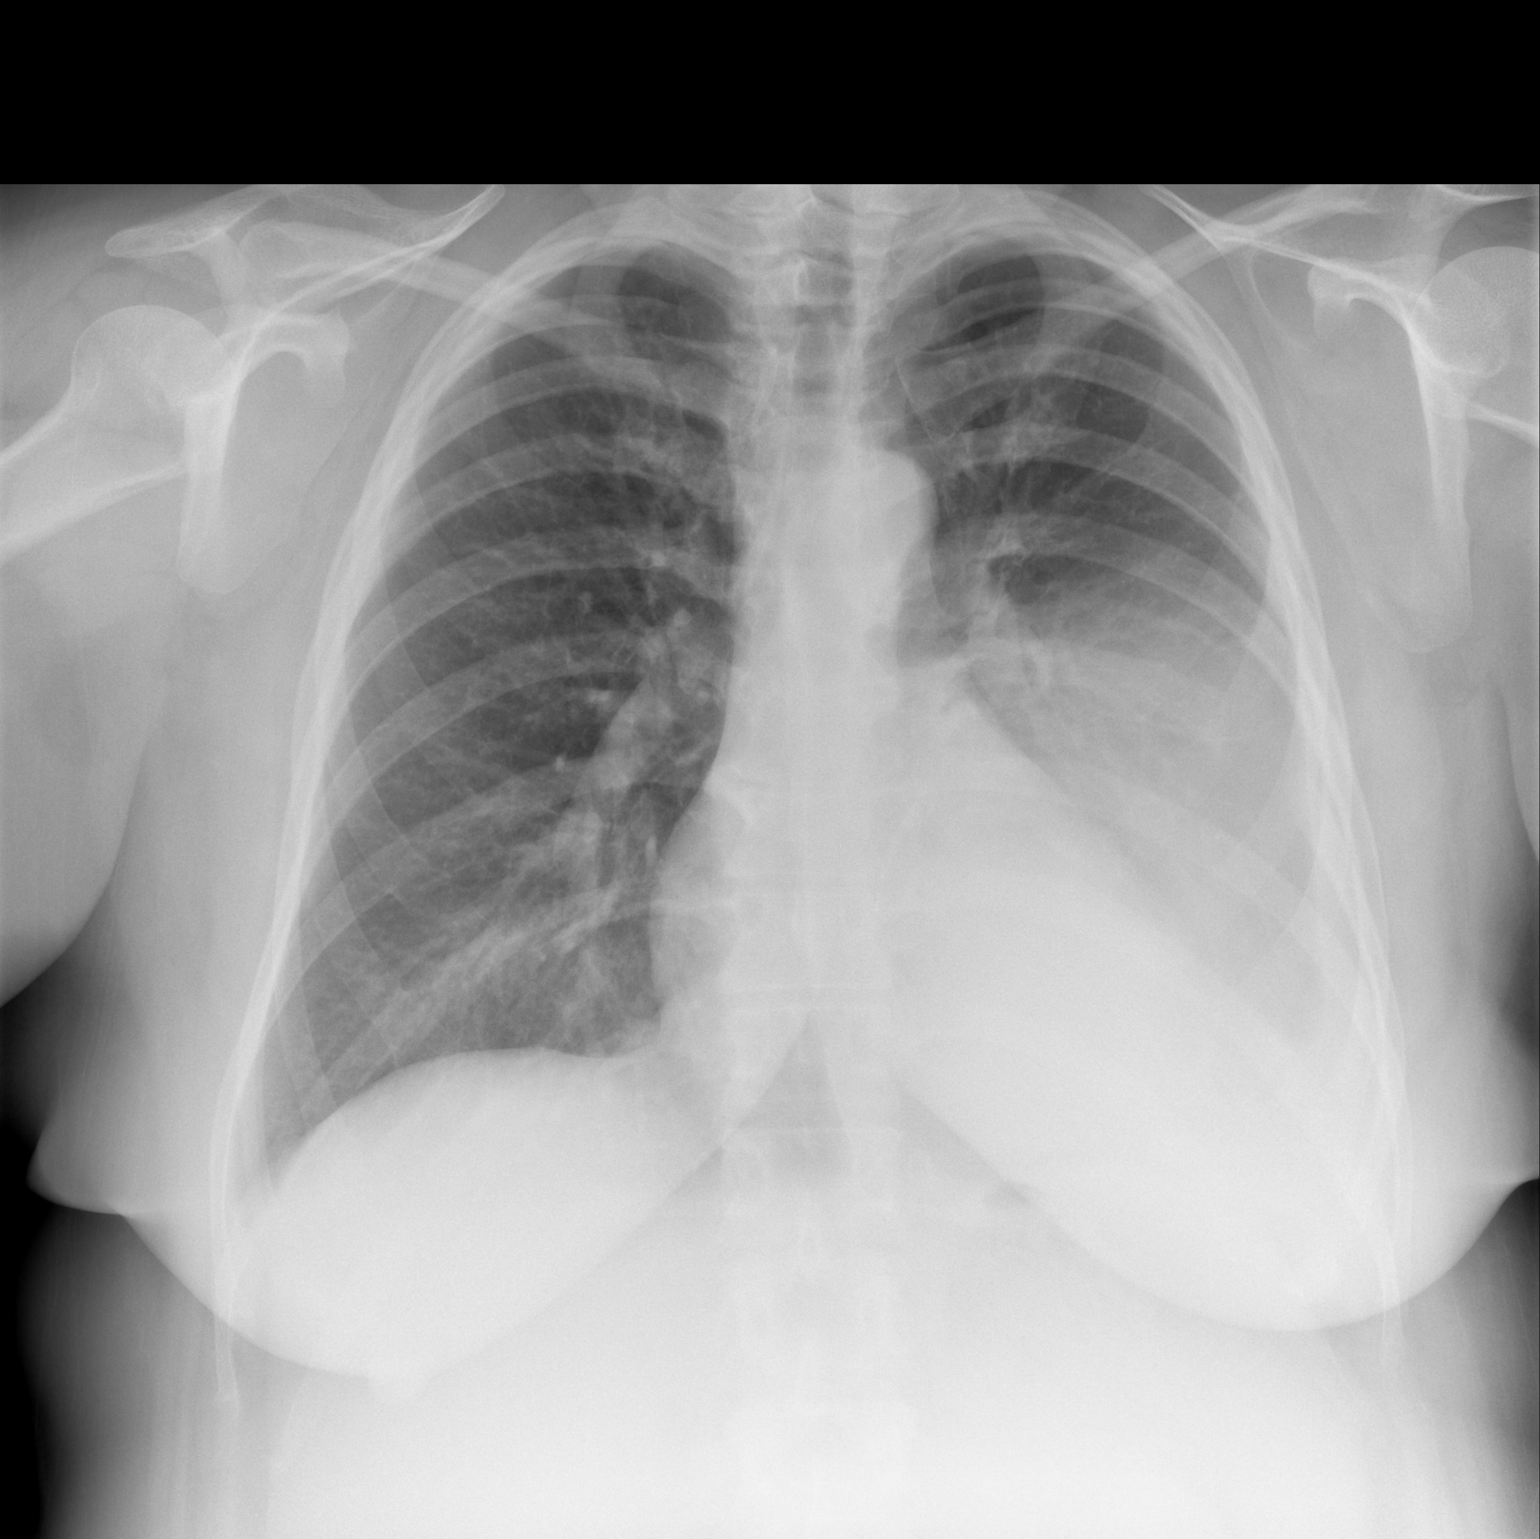

[w chest lat]
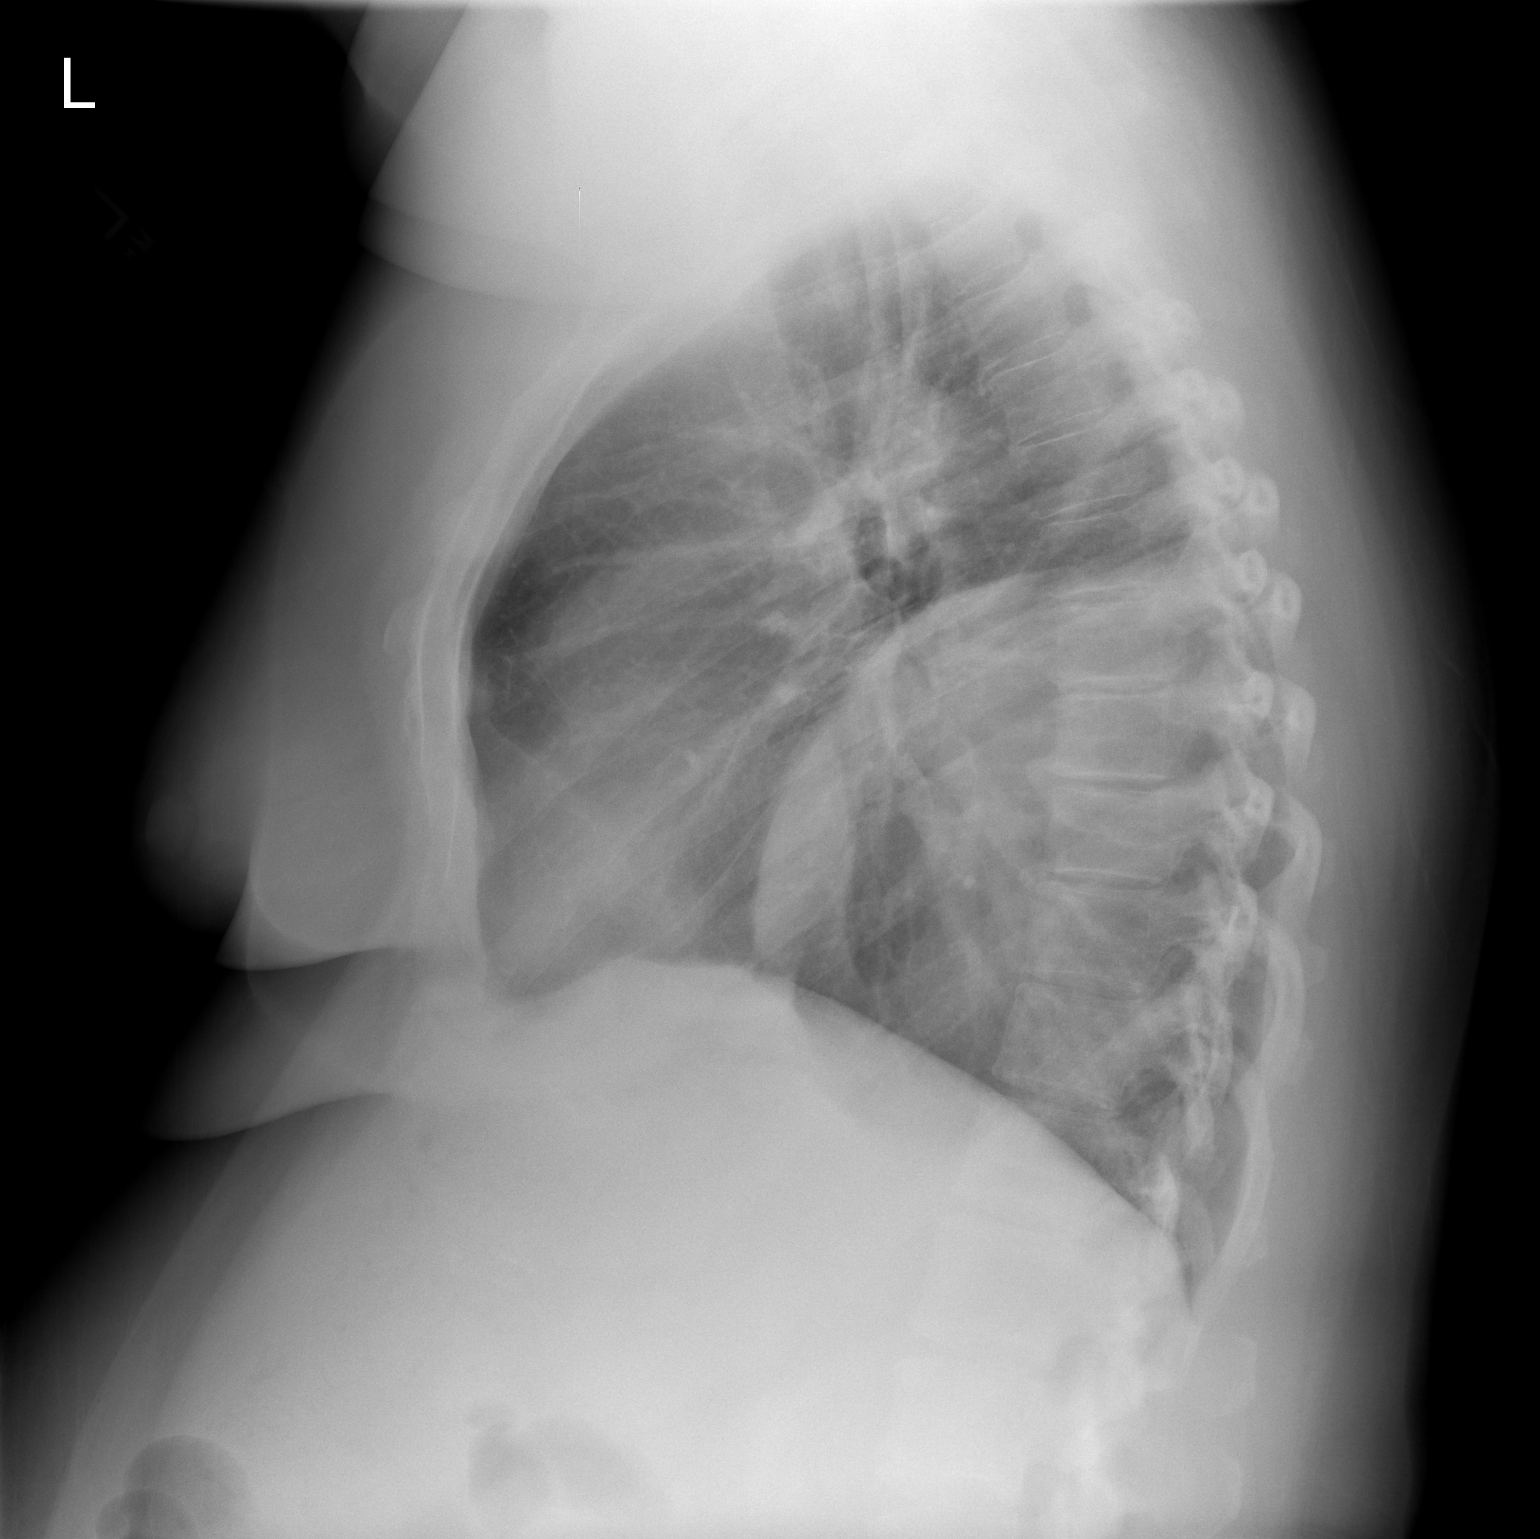

[2 of 2 positions shown; findings below may reference images not displayed]

FINDINGS: The cardiac silhouette, mediastinal and hilar contours
are stable.  The right lung remains clear.  Persistent left lower
lobe opacity appears to be a combination of a loculated pleural
fluid collection and atelectasis or infiltrate in the lung.  There
is been partial resection of the posterior rib.  Chest CT may be
helpful for reassessment.
IMPRESSION: Persistent left lower lobe process appears to be a combination of
loculated pleural fluid and atelectasis or infiltrate in the left
lower lobe.  Follow-up CT assessment may be useful.

## 2009-12-20 ENCOUNTER — Ambulatory Visit: Payer: Self-pay | Admitting: Critical Care Medicine

## 2010-01-04 ENCOUNTER — Ambulatory Visit: Payer: Self-pay | Admitting: Family Medicine

## 2010-01-04 ENCOUNTER — Telehealth: Payer: Self-pay | Admitting: Family Medicine

## 2010-01-08 ENCOUNTER — Encounter: Payer: Self-pay | Admitting: Family Medicine

## 2010-01-08 ENCOUNTER — Ambulatory Visit: Payer: Self-pay | Admitting: Family Medicine

## 2010-01-08 DIAGNOSIS — R609 Edema, unspecified: Secondary | ICD-10-CM | POA: Insufficient documentation

## 2010-01-10 LAB — CONVERTED CEMR LAB
AST: 20 units/L (ref 0–37)
Albumin: 3.5 g/dL (ref 3.5–5.2)
Alkaline Phosphatase: 72 units/L (ref 39–117)
Bilirubin, Direct: 0.1 mg/dL (ref 0.0–0.3)
CO2: 34 meq/L — ABNORMAL HIGH (ref 19–32)
Chloride: 100 meq/L (ref 96–112)
Glucose, Bld: 136 mg/dL — ABNORMAL HIGH (ref 70–99)
Potassium: 4.3 meq/L (ref 3.5–5.1)
Sodium: 140 meq/L (ref 135–145)
Total Protein: 6.5 g/dL (ref 6.0–8.3)

## 2010-01-23 ENCOUNTER — Encounter: Payer: Self-pay | Admitting: Critical Care Medicine

## 2010-02-06 ENCOUNTER — Ambulatory Visit: Admit: 2010-02-06 | Payer: Self-pay | Admitting: Family Medicine

## 2010-02-07 ENCOUNTER — Ambulatory Visit
Admission: RE | Admit: 2010-02-07 | Discharge: 2010-02-07 | Payer: Self-pay | Source: Home / Self Care | Attending: Critical Care Medicine | Admitting: Critical Care Medicine

## 2010-02-23 ENCOUNTER — Encounter: Payer: Self-pay | Admitting: Family Medicine

## 2010-02-24 ENCOUNTER — Encounter: Payer: Self-pay | Admitting: Internal Medicine

## 2010-02-27 ENCOUNTER — Encounter: Payer: Self-pay | Admitting: Family Medicine

## 2010-03-07 NOTE — Letter (Signed)
Summary: [facility]  [facility]   Imported By: Lanelle Bal 11/20/2009 10:56:31  _____________________________________________________________________  External Attachment:    Type:   Image     Comment:   External Document

## 2010-03-07 NOTE — Assessment & Plan Note (Signed)
Summary: sob//lmr   Primary Provider/Referring Provider:  Colon Flattery Tower MD  CC:  still sob and has increased cough.  History of Present Illness: 78 yowf last smoked around [age]   year old female with chronic L pleural empyema s/p VATS 2010.  Still with persistent disease L chest , not a candidate for decortication per [facility] Dr Donata Clay   Pt admitted 3/4-04/08/08.  Had sudden onset of feeling hot and flushed.  ? CVA ruled out but in ED found abn CXR ?PNA.  f/u ct chest showed loculated L pleural effusion.  Had been previously evaluated for same in past.  Tap showed dark brown fluid with negative pleural fluid cytology.  Had no positive micro data.    Dx lupus with nephritis and joint involvement.  Had serositis with pericardial and pleural effusion in the past.   Then was followed expectantly.   Dr Kellie Simmering is rheum MD. On no systemic rx at this time.  May 29, 2009  This patient has not been seen since June 2010. Patient did well until 6 weeks ago, when she became dizzy and went to work. She went to the emergency room an IV was placed in the right arm. Cardiac workup was negative and she was sent home. She then devleoped  right arm pain and swelling.  She was dx with   superficial thrombophlebitis. She was treated with tramadol only. l. She did not improve and returned on 16 May 2008 to the ED with  chest pain in the front and back. A CT scan of the chest reveal pulmonary embolism. Right lower lobe.   She was  rx with Lovenox and Coumadin bridging. Over time she improved, but then on 24 April, she returned emergently for chest pain. She  had pleuritic-type syndrome complex. Chest CT scan was repeated and showed resolution of right lower lobe pulmonary embolism. She was however, present with right pleural effusion. She is still having ongoing dyspnea and chest pain. She has a dry cough. There is no fever, chills, or sweats. She is here now for further evaluation on referral from the emergency  room.  this pt also has a R axillary LN which is in need of Biopsy per surgeryJuly 13, 201  to ED and had more dyspnea and pressure.  CXR no real change.  The pt still with  L loculated effusion.   The prednisone was down to 10mg  /d and this is when this occured. Not as much tightness as before.  If walks anywhere is out of breath.  Still on coumadin,  cont lovenox and coumadin LN was biopsied and was neg for lymphoma no cough  August 27, 2009 cc  since finished prednisone x 2 weeks worse doe room to room and now can't lie back to sleep on one pillow any more x 1 week with bilateral chest discomfort during deep breathing, minimal dry cough. --tx w/ steroid burst  August 31, 2009--Returns for persistent symptoms. Was seen 4 days ago, tx w/ steroid burst. Pt is very complicated. Since last 4 months has worsening of dyspnea. Initially dx w/ PE in 4/11 started on coumadin/lovenox bridge. CT showed smll right effusion and loculated left effusion. Over last 2 weeks continued w/ increased dsypnea. Tx w/ steroids. Xray showed locuated left effusion and increasing right effusion. She increased prednisone up to 40mg  , now on 30mg  -first day./ W. only minimal improvment. Still has DOE, and now increased cough w/ creamy mucus. Denies  hemoptysis, fever, n/v/d, eadache.  Preventive Screening-Counseling & Management  Alcohol-Tobacco     Smoking Status: quit     Packs/Day: 1-2 cigs a day     Year Started: 1986     Year Quit: 2002  Allergies: 1)  ! 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 (04/1998) Superficial thrombus, right leg (01/2000) Hosp- pericardial effusion (06/2003) UTI- hematuria work up (04/2004) CT pelvis- neg. (05/2004) Pleurocentesis (10/2004) thoracentesis 2010 with pneumonia  Social History: Last updated: 04/06/2008 Marital Status: separated/divorced  Children: 3 Occupation: Psychologist, educational at [employer] Former Smoker  Review of Systems      See HPI  Vital Signs:  Patient profile:   45 year old female Height:      65 inches Weight:      340.50 pounds BMI:     56.87 O2 Sat:      94 % on Room air Temp:     98.9 degrees F oral Pulse rate:   92 / minute BP sitting:   150 / 90  (left arm) Cuff size:   large  Vitals Entered By: Randell Loop CMA (August 31, 2009 4:41 PM)  O2 Sat at Rest %:  94 O2 Flow:  Room air CC: still sob, has increased cough Is Patient Diabetic? No Pain Assessment Patient in pain? yes      Onset of pain  lower back pain that radiates into her shoulders and neck Comments meds and daytime phone number updated today with pt Randell Loop CMA  August 31, 2009 4:47 PM    Physical Exam  Additional Exam:  Gen: Pleasant, obese, in no distress,  normal affect ENT: No lesions,  mouth clear,  oropharynx clear, no postnasal drip Neck: No JVD, no TMG, no carotid bruits Lungs: No use of accessory muscles, decreased BS in bases   Cardiovascular: RRR, heart sounds normal, no murmur or gallops, venous insuff. changes.  Abdomen: soft and NT, no HSM,  BS normal Musculoskeletal: No deformities, no cyanosis or clubbing Neuro: alert, non focal Skin: Warm, scattered skin irritation, papules along arm.     Impression & Recommendations:  Problem # 1:  PLEURAL EFFUSION (ICD-511.9) Bilateral pleural effusion w/ increased right sided effuison and left loculated effusion Scan/xray reviewed in detail w/ case to Dr. Shelle Iron  pt is stable w/ nml vital signs. will tx empirically w/ abx cont on  steroids set up for CT chest scan to determine next course of action.  follow up in 3 days I have explained to pt if symptoms worsen she is to go to ER  REC:  Levaquin 750mg  once daily w/ food for 7 days Mucinex DM two times a day as needed cough We are setting you up for CT scan of lungs.  follow up Dr. Sherene Sires next week.  Taper prednisone to 20mg  once daily  hold until seen back in office.  Please contact office for sooner follow up if symptoms do not improve or worsen  Orders: Radiology Referral (Radiology) Est. Patient Level IV (51884)  Medications Added to Medication List This Visit: 1)  Levaquin 750  Mg Tabs (Levofloxacin) .Marland Kitchen.. 1 by mouth once daily  Complete Medication List: 1)  Paxil 40 Mg Tabs (Paroxetine hcl) .... Take 1by mouth once daily 2)  Aspirin 81 Mg Tbec (Aspirin) .... Take one by mouth daily 3)  Synthroid 200 Mcg Tabs (Levothyroxine sodium) .... Take one by mouth daily 4)  Clobestrol Oinment  .... Apply to ankles as needed 5)  Alprazolam 0.5 Mg Tabs (Alprazolam) .Marland Kitchen.. 1 by mouth up to two times a day as needed severe anxiety 6)  Coumadin 7.5 Mg Tabs (Warfarin sodium) .... Take one tablet by mouth daily 7)  Prilosec 20 Mg Cpdr (Omeprazole) .Marland Kitchen.. 1 by mouth once daily in ams 8)  Vicodin ?mg  .... At bedtime as needed 9)  Plaquenil 200 Mg Tabs (Hydroxychloroquine sulfate) .... Take 1 tablet by mouth once a day 10)  Crestor 10 Mg Tabs (Rosuvastatin calcium) .Marland Kitchen.. 1 by mouth once daily 11)  Prednisone 10 Mg Tabs (Prednisone) .... 4 each am x 3 days,  3 x 3 days, 2x3 days, and 1x3 days 12)  Bystolic 5 Mg Tabs (Nebivolol hcl) .... One tablet daily 13)  Levaquin 750 Mg Tabs (Levofloxacin) .Marland Kitchen.. 1 by mouth once daily  Patient Instructions: 1)  Levaquin 750mg  once daily w/ food for 7 days 2)  Mucinex DM two times a day as needed cough 3)  We are setting you up for CT scan of lungs.  4)  follow up Dr. Sherene Sires next week.  5)  Taper prednisone to 20mg  once daily  hold until seen back  in office.  6)  Please contact office for sooner follow up if symptoms do not improve or worsen  Prescriptions: LEVAQUIN 750 MG TABS (LEVOFLOXACIN) 1 by mouth once daily  #7 x 0   Entered and Authorized by:   Rubye Oaks NP   Signed by:   Meghna Hagmann NP on 08/31/2009   Method used:   Electronically to        [facility]. [id number]* (retail)       [address]       [place], [place]  [address]       Ph: [phone] or [phone]       Fax: [phone]   RxID:   [id number]

## 2010-03-07 NOTE — Progress Notes (Signed)
Summary: prior authorization- AWAITING FAX  Phone Note Other Incoming Call back at [phone]   Caller: [facility]isa[facility] Summary of Call: Needs to speak with nurse, re: prior autho. Initial call taken by: Darletta Moll,  September 24, 2009 4:35 PM  Follow-up for Phone Call        Called and spoke with Misty Stanley at [employer].  She was calling to inform us that coverage for ct chest was denied.  Will forward to [facility].  this was scheduled and done @ the [place] office  Follow-up by: Vernie Murders,  September 24, 2009 4:45 PM  Additional Follow-up for Phone Call Additional follow up Details #1::        I spoke with Bjorn Loser who is a nurse with [employer] and gave clinical data to see if denial of CT chest could be reversed. She says this data will be reviewed and turn around time is usually 24 hours.  They will fax or call with decision. [facility] ph# is [phone] andfax # is [phone]. Additional Follow-up by: Michel Bickers CMA,  September 25, 2009 10:59 AM    Additional Follow-up for Phone Call Additional follow up Details #2::    Called [facility].  Spoke with [employer].  Per Deanna Artis, CTA Chest is still showing denied but she transferred me to the [facility] response unit to further clarrify this.  Spoke with Kendal Hymen in the [facility] Response Unit.  Per Kendal Hymen, this is still pending reconsideration as of yesterday.  States it usually takes 24 hours to have a response -- we should have a response by end of business day today.  For them, that is 8pm central time.  States we will receive answer via fax, there will be no phone call regarding decision.    Case #  [id number] Follow-up by: Gweneth Dimitri RN,  September 26, 2009 9:17 AM  Additional Follow-up for Phone Call Additional follow up Details #3:: Details for Additional Follow-up Action Taken: Fax received stating CTA Chest was approved.  Had this letter scanned into EMR.  Gweneth Dimitri RN  September 26, 2009 4:45 PM

## 2010-03-07 NOTE — Progress Notes (Signed)
Summary: lovenox  Phone Note Call from Patient Call back at Home Phone [phone]   [name] Call For: [name] MD Summary of Call: Patient said that while in hospital they gave her rx for lovenox injections for 7 days. When she went to pharmacy it was going to cost her 1,000.00 and so she only bought enough for 2 days. She finished her last one today and wants to know if she needs to do it for 7 days or if it is okay to only do the coumadin. Please advise. She also wants to know if there is something else she can take that is cheeper. Uses [facility] in [place]. Initial call taken by: Melody Comas,  May 18, 2009 1:55 PM  Follow-up for Phone Call        please send for d/c summary when ready  she needs to take the 7 days - or whatever they recommended and there is unfortunately no substitution for it  do not want to stop it until inr is theraputic -- is she having that checked for the coumadin? Follow-up by: [name] MD,  May 18, 2009 2:27 PM  Additional Follow-up for Phone Call Additional follow up Details #1::        Patient notified as instructed by telephone. Pt to have blood drawn tomorrow at [facility] [address] in [place]. Pt to see Dr Dorna Mai so she will ask them to send results to Dr Dayton Martes since Dr Milinda Antis is not in office next week.Lewanda Rife LPN  May 18, 2009 5:37 PM

## 2010-03-07 NOTE — Progress Notes (Signed)
Summary: Disablility forms  Phone Note Outgoing Call   Summary of Call: Forms from [employer] signed by PW, [employer] Folder placed in "go back bin" on Side A to return to [employer]. Melanee Left Attending Physician's Statement of Disability. Will forward to [employer]. Initial call taken by: Zackery Barefoot CMA,  September 21, 2009 4:29 PM  Follow-up for Phone Call        Fleet Contras no longer working in [employer].  Arlice Colt has taken her place however cannot receive phone notes.  Will send a flag to her so she is aware of this. Gweneth Dimitri RN  September 24, 2009 9:29 AM

## 2010-03-07 NOTE — Progress Notes (Signed)
Summary: Letter to be off Coumadin prior to procedure  Phone Note From Other Clinic   Caller: Referral Coordinator Summary of Call: Received  a call from Tobi Bastos at [facility]  Radiology Dept. she has Lawson Fiscal scheduled for the CT Guided LN Biopsy under sedation at [facility] Stay on 07/20/2009 at 8:30.  She needs you to give the hospital a written note saying that it is ok for her to be off her Coumadin for 4 days prior to this procedure. This needs to be faxed to [facility] at [phone]. Her last  day for taking it should be Sunday June 12th, then be off it 6/13/6/14,6/15,6/16. She said most times they tell the patient to resume it that nite when they get home from the hospital. Patient is aware of this procedure. I also called Dr Florene Route office and let them knoe when this was being done. Initial call taken by: Carlton Adam,  Jun 15, 2009 3:56 PM  Follow-up for Phone Call        I wrote note -- please send them a copy as well as the patient - and make sure the pt knows to call us one week before so I can px her lovenox  Follow-up by: [name] MD,  Jun 18, 2009 8:06 AM  Additional Follow-up for Phone Call Additional follow up Details #1::        Letter faxed to [facility] [phone] as instructed. Patient notified as instructed by telephone and letter mailed to pt at home address also.Lewanda Rife LPN  Jun 18, 2009 9:05 AM

## 2010-03-07 NOTE — Progress Notes (Signed)
Summary: call a nurse   Phone Note Call from Patient   Call For: [name] MD Summary of Call: Triage Record Num: [id number] Operator: Yvette Rack Patient Name: [name] Call Date & Time: 05/27/2009 2:05:39PM Patient Phone: [phone] PCP: Ruthe Mannan Patient Gender: Female PCP Fax : [phone] Patient DOB: [birth year] [facility] Name[facility] Reason for Call: Pt calling @ recent hx of PE. Pt is currently on Coumadin and off Lovenox now. Today, pt reports experiencing difficulty taking a deep breath. She is SOB upon exertion. Pt denies chets pain. RN informed pt she needs to be seen now and she is going now to [facility] ER for evaluation. Additional care advice given. Protocol(s) Used: Breathing Problems Recommended Outcome per Protocol: See ED Immediately Reason for Outcome: New onset shortness of breath AND recent surgery or trauma (within 4 wks.), prolonged immobilization (bedrest, long travel) OR taking medication with estrogen Care Advice:  ~ Another adult should drive.  ~ Do not give the patient anything to eat or drink. Write down provider's name. List or place the following in a bag for transport with the patient: current prescription and/or OTC medications; alternative treatments, therapies and medications; and street drugs.  ~ 04/ Initial call taken by: Melody Comas,  May 28, 2009 9:08 AM  Follow-up for Phone Call        Triage Record Num: [id number] Operator: Yvette Rack Patient Name: [name] Call Date & Time: 05/27/2009 2:05:39PM Patient Phone: [phone] PCP: Ruthe Mannan Patient Gender: Female PCP Fax : [phone] Patient DOB: [birth year] [facility] Name[facility] Reason for Call: Pt calling @ recent hx of PE. Pt is currently on Coumadin and off Lovenox now. Today, pt reports experiencing difficulty taking a deep breath. She is SOB upon exertion. Pt denies chets pain. RN informed pt she needs to be seen now and she is  going now to [facility] ER for evaluation. Additional care advice given. Protocol(s) Used: Breathing Problems Recommended Outcome per Protocol: See ED Immediately Reason for Outcome: New onset shortness of breath AND recent surgery or trauma (within 4 wks.), prolonged immobilization (bedrest, long travel) OR taking medication with estrogen Care Advice:  ~ Another adult should drive.  ~ Do not give the patient anything to eat or drink. Write down provider's name. List or place the following in a bag for transport with the patient: current prescription Follow-up by: Melody Comas,  May 28, 2009 9:13 AM

## 2010-03-07 NOTE — Assessment & Plan Note (Signed)
Summary: sob/cxr 1st per PW/lmr   Primary Provider/Referring Provider:  [name] MD  CC:  OV - Worsening SOB for 1 week (Decreased Prednisone to 10 mg for 1 week then SOB started again) - Clears throat alot - Mild wheezing - Denies cough - Increased Pred back to 20 mg /day on 11-15-09.  History of Present Illness: 45 yowf last smoked around 06/2009  with chronic  L empyema s/p VATS 2010.  Still with persistent disease L chest , not a candidate for decortication per TCTS Dr Donata Clay   Pt admitted 3/4-04/08/08.  Had sudden onset of feeling hot and flushed.  ? CVA ruled out but in ED found abn CXR ?PNA.  f/u ct chest showed loculated L pleural effusion.  Had been previously evaluated for same in past.  Tap showed dark brown fluid with negative pleural fluid cytology.  Had no positive micro data.  Dx lupus with nephritis and joint involvement.  Had serositis with pericardial and pleural effusion in the past.   Then was followed expectantly.   Dr Kellie Simmering is rheum MD.    May 29, 2009  This patient has not been seen since June 2010. Patient did well until 6 weeks ago, when she became dizzy and went to work. She went to the emergency room an IV was placed in the right arm. Cardiac workup was negative and she was sent home. She then devleoped  right arm pain and swelling.  She was dx with   superficial thrombophlebitis. She was treated with tramadol only.  She did not improve and returned on 16 May 2008 to the ED with  chest pain in the front and back. A CT scan of the chest revealed  pulmonary embolism. Right lower lobe.   rx with Lovenox and Coumadin bridging. Over time she improved, but then on 24 April,  she returned emergently for pleuritic cp >  Chest CT scan was repeated and showed resolution of right lower lobe pulmonary embolism. She was however, present with right pleural effusion. She is still having ongoing dyspnea and chest pain. She has a dry cough. There is no fever, chills, or sweats.  She is here now for further evaluation on referral from the emergency room.  PAGE 2 >>>>>>>>>>>>>>>>>>.  August 15, 2009  to ED and had more dyspnea and pressure.  CXR no real change.  The pt still with  L loculated effusion.   The prednisone was down to 10mg  /d and this is when this occured.Not as much tightness as before.  If walks anywhere is out of breath.  Still on coumadin,  cont lovenox and coumadinAxillary LN was biopsied and was neg for lymphoma rec stay on same rx but stopped prednisone when rx ran out.    August 27, 2009 cc  since finished prednisone x 2 weeks worse doe room to room and now can't lie back to sleep on one pillow any more x 1 week with bilateral chest discomfort during deep breathing, minimal dry cough. --tx w/ steroid burst and d/c ace  August 31, 2009--Returns for persistent symptoms. Was seen 4 days ago, tx w/ steroid burst. Pt is very complicated. Since last 4 months has worsening of dyspnea. Initially dx w/ PE in 4/11 started on coumadin/lovenox bridge. CT showed smll right effusion and loculated left effusion. Over last 2 weeks continued w/ increased dsypnea. Tx w/ steroids. Xray showed locuated left effusion and increasing right effusion. She increased prednisone up to 40mg  , now on  30mg  -first day./ W. only minimal improvment. Still has DOE, and now increased cough w/ creamy mucus.   September 03, 2009 cough and sob flat much better, still doe x > 100 ft which is a chronic complaint.  n         September 14, 2009 3:24 PM Still cannot breathe, if try to do anything is out of breath, has to lean forward. Is not coughing.  No mucus.  No real chest pain  LN bx was unrevealing Pt wiht recurrent R pleural effusion, when tapers prednisone effusion worsens.  Hx lupus   September 20, 2009 2:26 PM Pt underwent thoracentesis this week and then developed pain 1.5 L removed.  Mon was procedure.  Tue ok Wed felt pain on L side.  continue to worsen.  Itches on L side,  progressed  throughout the day.  If breathe in is sharp pain R side is not the area of pain the pt is back on the coumadin  INR 1.0 but still on therapeutic lovenox as a bridge. No real cough.  Dyspnea is better.   No fever or chills or sweats.  October 22, 2009 2:47 PM Pt in hospital for VATS of R pleural effusion.  Had lovenox bridging. 8/29- 10/09/09 per Dr Donata Clay. Still has pleuryx catheter.  There is minimal pleural fluid output so far.  The pt goes to the office weekly for TCTS recheck Dyspnea is better.   Pleuryx ? to stay until end of october. From Echart all pleural fluid c/s neg.  from vats.   INR 2.2   past week Pt was on keflex due to chest tube insertion site was infected. Pt also had candida in throat and rx diflucan and just finished this 9/19   11/19/09--Presents for an acute office visit. Complains of worsening SOB for 1 week (Decreased Prednisone to 10 mg for 1 week then SOB started again) - Clears throat alot - Mild wheezing - Denies cough - Increased Pred back to 20 mg /day on 11-15-09.  Last ov rec to taper steroids. When she got to 10mg  of prednisone noticed breathing not as good. Has ov with CVTS this afternoon. Today CXR w/ no increase in effusion. .  Last INR  was 2.8 10/26/09. She denies chest pain, calf pain. Has has some increased fluid retention esp in legs. Weight is up  7 lbs.  She deneis increased cough, congestion, fever. Denies chest pain,  orthopnea, hemoptysis, fever, n/v/d,  headache.   Current Medications (verified): 1)  Paxil 40 Mg Tabs (Paroxetine Hcl) .... Take 1by Mouth Once Daily 2)  Aspirin 81 Mg Tbec (Aspirin) .... Take One By Mouth Daily 3)  Synthroid 200 Mcg  Tabs (Levothyroxine Sodium) .... Take One By Mouth Daily 4)  Clobestrol Oinment .... Apply To Ankles As Needed 5)  Alprazolam 0.5 Mg Tabs (Alprazolam) .Marland Kitchen.. 1 By Mouth Up To Two Times A Day As Needed Severe Anxiety 6)  Coumadin 7.5 Mg Tabs (Warfarin Sodium) .... As Directed 7)  Prilosec 20 Mg Cpdr  (Omeprazole) .Marland Kitchen.. 1 By Mouth Once Daily in Ams 8)  Percocet 5-325 Mg Tabs (Oxycodone-Acetaminophen) .Marland Kitchen.. 1-2 Every 4 Hours As Needed 9)  Plaquenil 200 Mg Tabs (Hydroxychloroquine Sulfate) .... Take 3 Tablet By Mouth Once A Day 10)  Prednisone 10 Mg  Tabs (Prednisone) .... Reduce To 30mg  Per Day For 5 Days Then Reduce To 20mg  Per Day For 5 Days Then 10mg  Daily and Stay 11)  Bystolic 10 Mg  Tabs (Nebivolol Hcl) .... One Tablet Daily 12)  Synthroid 25 Mcg Tabs (Levothyroxine Sodium) .Marland Kitchen.. 1 Every Am Before Breakfast 13)  Lasix 40 Mg Tabs (Furosemide) .... As Needed 14)  Lyrica 75 Mg Caps (Pregabalin) .... Take 1 Capsule By Mouth Once A Day  Allergies (verified): 1)  ! Avelox 2)  Amoxicillin 3)  Lipitor  Comments:  Nurse/Medical Assistant: [name] medications and allergies were reviewed with the patient and were updated in the Medication and Allergy Lists.  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 (04/1998) Superficial thrombus, right leg (01/2000) Hosp- pericardial effusion (06/2003) UTI- hematuria work up (04/2004) CT pelvis- neg. (05/2004) Pleurocentesis (10/2004) thoracentesis 2010 with pneumonia  Family History: Last updated: 04/06/2008 Sister with lupus GM with DM  Social History: Last updated: 10/22/2009 Marital Status: separated/divorced  Children: 3 Occupation: Psychologist, educational at [employer] Former Smoker.  socially x 10 years ( 1 pack/month)  Last cigerette Spring 2011  Review of Systems      See HPI  Vital Signs:  Patient profile:   45 year old female Weight:      356.50 pounds O2 Sat:      93 % on Room air Temp:     8.1 degrees F oral Pulse rate:   78 / minute BP sitting:   120 / 88  (left arm) Cuff size:   large  Vitals Entered By: Abigail Miyamoto RN (November 19, 2009 11:10 AM)  O2 Flow:  Room air  Physical Exam  Additional Exam:  Gen: Pleasant, obese, in no distress,  normal affect wt 340 > 338 September 03, 2009 > 339 Aug 12>  338 Aug 18>>349 9/19>>356 10/17  ENT: No lesions,  mouth clear,  oropharynx clear, no postnasal drip Neck: No JVD, no TMG, no carotid bruits Lungs: No use of accessory muscles,  no change in decreased BS in LLL, open wound from prev chest tube. , no redness.  Cardiovascular: RRR, heart sounds normal, no murmur or gallops, venous insuff. changes.  Abdomen: soft and NT, no HSM,  BS normal Musculoskeletal: No deformities, no cyanosis or clubbing Neuro: alert, non focal Skin: Warm, scattered skin irritation, papules along arm.     Impression & Recommendations:  Problem # 1:  PLEURAL EFFUSION (ICD-511.9) xray with no significant change. xrays reviewed with Dr. Delford Field  labs pending.  Plan :  Begin Lasix 40mg  once daily x 2 days , then as needed once daily for fluid retention.  Low salt diet.  I will call with lab results.  We are setting you up for overnight oximetry  follow up Dr. Delford Field as scheduled.  Please contact office for sooner follow up if symptoms do not improve or worsen  Orders: DME Referral (DME) TLB-BMP (Basic Metabolic Panel-BMET) (80048-METABOL) TLB-BNP (B-Natriuretic Peptide) (83880-BNPR) TLB-CBC Platelet - w/Differential (85025-CBCD) Est. Patient Level IV (04540)  Problem # 2:  PULMONARY EMBOLISM (ICD-415.19) labs pending.  Her updated medication list for this problem includes:    Aspirin  81 Mg Tbec  (Aspirin) .Marland Kitchen... Take one by mouth daily    Coumadin 7.5 Mg Tabs (Warfarin sodium) .Marland Kitchen... As directed  Orders: T-D-Dimer Fibrin Derivatives Quantitive [phone]) TLB-PT (Protime) (85610-PTP) Est. Patient Level IV (27253)  Problem # 3:  DYSPNEA (ICD-786.05)  ?mild volume overlod she has minimal desaturation w/ ambulation  will check ONO  trial of lasix x 2 days bnp pening.   Orders: Est. Patient Level IV (66440)  Complete Medication List: 1)  Paxil 40 Mg Tabs (Paroxetine hcl) .... Take 1by mouth once daily 2)  Aspirin 81 Mg Tbec (Aspirin) .... Take one by mouth daily 3)  Synthroid 200 Mcg Tabs (Levothyroxine sodium) .... Take one by mouth daily 4)  Clobestrol Oinment  .... Apply to ankles as needed 5)  Alprazolam 0.5 Mg Tabs (Alprazolam) .Marland Kitchen.. 1 by mouth up to two times a day as needed severe anxiety 6)  Coumadin 7.5 Mg Tabs (Warfarin sodium) .... As directed 7)  Prilosec 20 Mg Cpdr (Omeprazole) .Marland Kitchen.. 1 by mouth once daily in ams 8)  Percocet 5-325 Mg Tabs (Oxycodone-acetaminophen) .Marland Kitchen.. 1-2 every 4 hours as needed 9)  Plaquenil 200 Mg Tabs (Hydroxychloroquine sulfate) .... Take 3 tablet by mouth once a day 10)  Prednisone 10 Mg Tabs (Prednisone) .... Reduce to 30mg  per day for 5 days then reduce to 20mg  per day for 5 days then 10mg  daily and stay 11)  Bystolic 10 Mg Tabs (Nebivolol hcl) .... One tablet daily 12)  Synthroid 25 Mcg Tabs (Levothyroxine sodium) .Marland Kitchen.. 1 every am before breakfast 13)  Lasix 40 Mg Tabs (Furosemide) .... As needed 14)  Lyrica 75 Mg Caps (Pregabalin) .... Take 1 capsule by mouth once a day  Patient Instructions: 1)  Begin Lasix 40mg  once daily x 2 days , then as needed once daily for fluid retention.  2)  Low salt diet.  3)  I will call with lab results.  4)  We are setting you up for overnight oximetry  5)  follow up Dr. Delford Field as scheduled.  6)  Please contact office for sooner follow up if symptoms do not improve or worsen   .Ambulatory Pulse  Oximetry  Resting; HR___85__    02 Sat____93_  Lap1 (185 feet)   HR__105___   02 Sat91_____ Lap2 (185 feet)   HR109   ____   02 Sat_89____    Lap3 (185 feet)   HR_____   02 Sat_____  __Test Completed without Difficulty _x__Test Stopped due to: pt was fatigued   After resting sats = 91 %RA and HR=88 .Kandice Hams [facility]  November 19, 2009 11:45 AM

## 2010-03-07 NOTE — Assessment & Plan Note (Signed)
Summary: Acute Pulmonary OV   Primary Provider/Referring Provider:  [name] MD   History of Present Illness: 45 yowf last smoked around 06/2009  with chronic  L empyema s/p VATS 2010.  Still with persistent disease L chest , not a candidate for decortication per [facility] Dr Donata Clay   Pt admitted 3/4-04/08/08.  Had sudden onset of feeling hot and flushed.  ? CVA ruled out but in ED found abn CXR ?PNA.  f/u ct chest showed loculated L pleural effusion.  Had been previously evaluated for same in past.  Tap showed dark brown fluid with negative pleural fluid cytology.  Had no positive micro data.    Dx lupus with nephritis and joint involvement.  Had serositis with pericardial and pleural effusion in the past.   Then was followed expectantly.   Dr Kellie Simmering is rheum MD.    May 29, 2009  This patient has not been seen since June 2010. Patient did well until 6 weeks ago, when she became dizzy and went to work. She went to the emergency room an IV was placed in the right arm. Cardiac workup was negative and she was sent home. She then devleoped  right arm pain and swelling.  She was dx with   superficial thrombophlebitis. She was treated with tramadol only.  She did not improve and returned on 16 May 2008 to the ED with  chest pain in the front and back. A CT scan of the chest revealed  pulmonary embolism. Right lower lobe.   rx with Lovenox and Coumadin bridging. Over time she improved, but then on 24 April,  she returned emergently for pleuritic cp >  Chest CT scan was repeated and showed resolution of right lower lobe pulmonary embolism. She was however, present with right pleural effusion. She is still having ongoing dyspnea and chest pain. She has a dry cough. There is no fever, chills, or sweats. She is here now for further evaluation on referral from the emergency room.  July 13, 201  to ED and had more dyspnea and pressure.  CXR no real change.  The pt still with  L loculated effusion.   The  prednisone was down to 10mg  /d and this is when this occured. Not as much tightness as before.  If walks anywhere is out of breath.  Still on coumadin,  cont lovenox and coumadin Axillary LN was biopsied and was neg for lymphoma rec stay on same rx but stopped prednisone when rx ran out.     August 27, 2009 cc  since finished prednisone x 2 weeks worse doe room to room and now can't lie back to sleep on one pillow any more x 1 week with bilateral chest discomfort during deep breathing, minimal dry cough. --tx w/ steroid burst and d/c ace  August 31, 2009--Returns for persistent symptoms. Was seen 4 days ago, tx w/ steroid burst. Pt is very complicated. Since last 4 months has worsening of dyspnea. Initially dx w/ PE in 4/11 started on coumadin/lovenox bridge. CT showed smll right effusion and loculated left effusion. Over last 2 weeks continued w/ increased dsypnea. Tx w/ steroids. Xray showed locuated left effusion and increasing right effusion. She increased prednisone up to 40mg  , now on 30mg  -first day./ W. only minimal improvment. Still has DOE, and now increased cough w/ creamy mucus.   September 03, 2009 cough and sob flat much better, still doe x > 100 ft which is a chronic complaint.  no leg swelling aches in joints or skin rash. Pt denies any significant sore throat, dysphagia, itching, sneezing,  nasal congestion or excess secretions,  fever, chills, sweats, unintended wt loss, pleuritic or exertional cp, hempoptysis, variability in activity tolerance  orthopnea pnd . Pt also denies any obvious fluctuation in symptoms with weather or environmental change or other alleviating or aggravating factors.          September 14, 2009 3:24 PM Still cannot breathe, if try to do anything is out of breath, has to lean forward. Is not coughing.  No mucus.  No real chest pain  LN bx was unrevealing Pt wiht recurrent R pleural effusion, when tapers prednisone effusion worsens.  Hx lupus   September 20, 2009 2:26  PM Pt underwent thoracentesis this week and then developed pain 1.5 L removed.  Mon was procedure.  Tue ok Wed felt pain on L side.  continue to worsen.  Itches on L side,  progressed throughout the day.  If breathe in is sharp pain R side is not the area of pain the pt is back on the coumadin  INR 1.0 but still on therapeutic lovenox as a bridge. No real cough.  Dyspnea is better.   No fever or chills or sweats.    Preventive Screening-Counseling & Management  Alcohol-Tobacco     Smoking Status: quit     Packs/Day: 1-2 cigs a day     Year Started: 1986     Year Quit: 2002  Current Medications (verified): 1)  Paxil 40 Mg Tabs (Paroxetine Hcl) .... Take 1by Mouth Once Daily 2)  Aspirin 81 Mg Tbec (Aspirin) .... Take One By Mouth Daily 3)  Synthroid 200 Mcg  Tabs (Levothyroxine Sodium) .... Take One By Mouth Daily 4)  Clobestrol Oinment .... Apply To Ankles As Needed 5)  Alprazolam 0.5 Mg Tabs (Alprazolam) .Marland Kitchen.. 1 By Mouth Up To Two Times A Day As Needed Severe Anxiety 6)  Coumadin 7.5 Mg Tabs (Warfarin Sodium) .... Take One Tablet By Mouth Daily Hold For 3 Days Prior To Thoracentesis, and Start Lovenox Subcutaneously 7)  Prilosec 20 Mg Cpdr (Omeprazole) .Marland Kitchen.. 1 By Mouth Once Daily in Ams 8)  Vicodin ?mg .... At Bedtime As Needed 9)  Plaquenil 200 Mg Tabs (Hydroxychloroquine Sulfate) .... Take 3 Tablet By Mouth Once A Day 10)  Crestor 10 Mg Tabs (Rosuvastatin Calcium) .Marland Kitchen.. 1 By Mouth Once Daily 11)  Prednisone 10 Mg  Tabs (Prednisone) .... 6 Per Day For 4 Days, Then 5 Per Day For 4 Days, Then 4 Per Day For 4 Days Then 3 Per Day and Stay 12)  Bystolic 10 Mg  Tabs (Nebivolol Hcl) .... One Tablet Daily 13)  Synthroid 25 Mcg Tabs (Levothyroxine Sodium) .Marland Kitchen.. 1 Every Am Before Breakfast 14)  Lovenox 150 Mg/ml Soln (Enoxaparin Sodium) .... 150mg  Subcutaneously Twice Daily 15)  Lasix ?mg .... As Needed  Allergies (verified): 1)  ! Avelox 2)  Amoxicillin 3)  Lipitor  Past History:  Past  medical, surgical, family and social histories (including risk factors) reviewed, and no changes noted (except as noted below).  Past Medical History: Reviewed history from 09/03/2009 and no changes required. 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 Stress echo- neg. (04/1998) Superficial thrombus, right leg (01/2000) Hosp- pericardial effusion (06/2003) UTI- hematuria work up (04/2004) CT pelvis- neg. (05/2004) Pleurocentesis (10/2004) thoracentesis 2010 with pneumonia  Family History: Reviewed history from 04/06/2008 and no changes required. Sister with lupus GM with DM  Social History: Reviewed history from 04/06/2008 and no changes required. Marital Status: separated/divorced  Children: 3 Occupation: Psychologist, educational at [employer] Former Smoker  Review of Systems       The patient complains of shortness of breath with activity, non-productive cough, and chest pain.  The patient denies shortness of breath at rest, productive cough, coughing up blood, irregular heartbeats, acid heartburn, indigestion, loss of appetite, weight change, abdominal pain, difficulty swallowing, sore throat, tooth/dental problems, headaches, nasal congestion/difficulty breathing through nose, sneezing, itching, ear ache, anxiety, depression, hand/feet swelling, joint stiffness or pain, rash, change in color of mucus, and fever.    Vital Signs:  Patient profile:   45 year old  female Height:      65 inches Weight:      338 pounds O2 Sat:      96 % Temp:     98.2 degrees F oral Pulse rate:   84 / minute BP sitting:   120 / 80  (left arm) Cuff size:   large  Vitals Entered By: Gweneth Dimitri RN (September 20, 2009 2:22 PM) CC: Acute Visit.  numb/itching/tight pain on left side, sharp pain on left when breathing in.  Pain worse when breathing in.  Breathing is better since thoracentesis.  Comments Medications reviewed with patient Daytime contact number verified with patient. Gweneth Dimitri RN  September 20, 2009 2:22 PM  IV placed in left wrist in Radiology.  I d/c'd this per PW request.  Site WNL.  Tip intact.  Gweneth Dimitri RN  September 20, 2009 4:33 PM     Physical Exam  Additional Exam:  Gen: Pleasant, obese, in no distress,  normal affect wt 340 > 338 September 03, 2009 > 339 Aug 12>  338 Aug 18 ENT: No lesions,  mouth clear,  oropharynx clear, no postnasal drip Neck: No JVD, no TMG, no carotid bruits Lungs: No use of accessory muscles, decreased BS in bases L>R  no change from before Cardiovascular: RRR, heart sounds normal, no murmur or gallops, venous insuff. changes.  Abdomen: soft and NT, no HSM,  BS normal Musculoskeletal: No deformities, no cyanosis or clubbing Neuro: alert, non focal Skin: Warm, scattered skin irritation, papules along arm.     CT of Chest  Procedure date:  09/20/2009  Findings:      Findings:  No interval acute pulmonary emboli seen.  Chronic thick rind loculated inferior left pleural fluid seen with adjacent left basilar atelectasis.  No change since 09/20/2009 and posterior layering moderate right pleural effusion with right basilar and lesser medial right middle lobe atelectasis. 2 mm benign calcified granuloma subpleural lateral left upper lobe (axial image 34) incidentally noted. Lungs are otherwise clear.  No pneumothorax seen.  No change since 04/06/2008 and right axillary/subpectoral adenopathy.  No new mediastinal,  hilar adenopathy visualized. Slight cardiomegaly stable.  Slight degenerative changes dorsal spine stable with no other significant osseous lesions visualized.   Review of the MIP images confirms the above findings.   IMPRESSION:   1.  No  interval acute pulmonary emboli. 2.  Stable chronic thick rind loculated inferior left pleural effusion with adjacent left basilar chronic atelectasis. 3. Freely posterior layering moderate right pleural effusion with right basilar and lesser medial right middle lobe atelectasis (progressive since 05/27/2009 and 08/11/2009, yet decreased since 08/27/2009). 4.  Stable right axillary/subpectoral adenopathy since  04/06/2008. 5.  No new interval acute findings.    Impression & Recommendations:  Problem # 1:  PLEURAL EFFUSION (ICD-511.9) Assessment Deteriorated Recurrent R pleural effusion.  s/p 1.5 L tap 8/15.  Now with rapid reaccumulation but not back to pre tap levels and assoc chest pain but no PE on CTA angio of chest today.   plan refer to TCTS Dr Maren Beach for VATS of R pleural space and poss later approach to solving L pleural space issue. cont prednisone 40mg /d Orders: T-2 View CXR (71020TC) Radiology Referral (Radiology) Est. Patient Level IV ([address]109)  Medications Added to Medication List This Visit: 1)  Lasix ?mg  .... As needed  Complete Medication List: 1)  Paxil 40 Mg Tabs (Paroxetine hcl) .... Take 1by mouth once daily 2)  Aspirin 81 Mg Tbec (Aspirin) .... Take one by mouth daily 3)  Synthroid 200 Mcg Tabs (Levothyroxine sodium) .... Take one by mouth daily 4)  Clobestrol Oinment  .... Apply to ankles as needed 5)  Alprazolam 0.5 Mg Tabs (Alprazolam) .Marland Kitchen.. 1 by mouth up to two times a day as needed severe anxiety 6)  Coumadin 7.5 Mg Tabs (Warfarin sodium) .... Take one tablet by mouth daily hold for 3 days prior to thoracentesis, and start lovenox subcutaneously 7)  Prilosec 20 Mg Cpdr (Omeprazole) .Marland Kitchen.. 1 by mouth once daily in  ams 8)  Vicodin ?mg  .... At bedtime as needed 9)  Plaquenil 200 Mg Tabs (Hydroxychloroquine sulfate) .... Take 3 tablet by mouth once a day 10)  Crestor 10 Mg Tabs (Rosuvastatin calcium) .Marland Kitchen.. 1 by mouth once daily 11)  Prednisone 10 Mg Tabs (Prednisone) .... 6 per day for 4 days, then 5 per day for 4 days, then 4 per day for 4 days then 3 per day and stay 12)  Bystolic 10 Mg Tabs (Nebivolol hcl) .... One tablet daily 13)  Synthroid 25 Mcg Tabs (Levothyroxine sodium) .Marland Kitchen.. 1 every am before breakfast 14)  Lovenox 150 Mg/ml Soln (Enoxaparin sodium) .... 150mg  subcutaneously twice daily 15)  Lasix ?mg  .... As needed  Patient Instructions: 1)  Stay on prednisone 40mg  daily 2)  I will discuss with Dr Donata Clay your case 3)  We may need to bring you  back to the hospital tomorrow.  I will call you tomorrow morning. 4)  Do not take coumadin for now 5)  Stay on Lovenox for now  Appended Document: Acute Pulmonary OV Since pt likely to have VATS, will d/c coumadin for now and cont lovenox.

## 2010-03-07 NOTE — Progress Notes (Signed)
Summary: WHEEZING  Phone Note Call from Patient Call back at [facility] Phone [phone]   [name] Call For: WRIGHT Summary of Call: IS IT NORMAL FOR TO BE WHEEZING MORE NOW THIS IS SITTING AND OUT OF BREATH WHEN WALKING Initial call taken by: Lacinda Axon,  August 31, 2009 2:56 PM  Follow-up for Phone Call        Spoke with pt.  She states that her breathing seems to be getting worse since seen by MW on 7/25.  She is having less wheezing when lies down but more DOE.  Appt sched with TP for this afternoon at 4:15 pm. Follow-up by: Vernie Murders,  August 31, 2009 3:34 PM

## 2010-03-07 NOTE — Letter (Signed)
Summary: Out of Work  [employer]  [address]   [place], [place] [address]   Phone: [phone]  Fax: [phone]    06/07/2009  TO: WHOM IT MAY CONCERN  RE: [name] [address] [place]       The above named individual is currently under my care and will be out of work    FROM: See prior note   THROUGH: 06/11/09      REASON: lung condition    MAY RETURN ON: 06/11/09     If you have any further questions or need additional information, please call.     Sincerely,   Shan Levans MD

## 2010-03-07 NOTE — Progress Notes (Signed)
Summary: regarding blood thinners  Phone Note Call from Patient Call back at Home Phone [phone]   [name] Call For: [name] MD Summary of Call: Pt states she has not had coumadin since last thursday night and she has not had a lovenox injection since 3:00 yesterday afternoon.  She is asking how and when to get back on these meds. Initial call taken by: Lowella Petties CMA,  September 17, 2009 2:32 PM  Follow-up for Phone Call        when is her thoracentesis - has she had it yet or upcoming ? Follow-up by: [name] MD,  September 17, 2009 3:04 PM  Additional Follow-up for Phone Call Additional follow up Details #1::        Left message on voicemail  to return call. Delilah Shan CMA [facility] Dull)  September 17, 2009 3:25 PM   Pt had procedure today Lowella Petties [facility]  September 17, 2009 3:26 PM  if she currently has no bleeding and there were no complications with the proceedure - can go ahead and start back on her regular coumadin dose check INR here on wed please continue the two times a day lovenox until her level is theraputic (let me know if she needs refil on that)  Additional Follow-up by: [name] MD,  September 17, 2009 4:57 PM    Additional Follow-up for Phone Call Additional follow up Details #2::    Left message on machine for patient to call back. Sydell Axon LPN  September 17, 2009 5:08 PM  Left message for patient to call back. Lewanda Rife LPN  September 18, 2009 12:04 PM   Patient notified as instructed by telephone. lab appointment scheduled as instructed 09/19/09 at 3:30pm.Rena [facility] LPN  September 18, 2009 2:45 PM

## 2010-03-07 NOTE — Letter (Signed)
Summary: Dr.Joseph Coladonato,[facility],Note  Dr.Joseph Coladonato,[facility],Note   Imported By: Beau [name] 07/19/2009 09:46:58  _____________________________________________________________________  External Attachment:    Type:   Image     Comment:   External Document

## 2010-03-07 NOTE — Letter (Signed)
Summary: [facility]   Imported By: Maryln Gottron 12/06/2009 14:28:14  _____________________________________________________________________  External Attachment:    Type:   Image     Comment:   External Document

## 2010-03-07 NOTE — Letter (Signed)
Summary: [facility]  [facility]   Imported By: Lanelle Bal 06/27/2009 10:41:27  _____________________________________________________________________  External Attachment:    Type:   Image     Comment:   External Document

## 2010-03-07 NOTE — Progress Notes (Signed)
Summary: Pain in right arm/swelling  Phone Note Call from Patient Call back at Home Phone [phone]   [name] Call For: Dr. Dayton Martes Summary of Call: Patient was seen for two small blood clots in her right arm, not getting any better.  Was given Tramadol for pain and it is not helping with the pain at all.  Pain in swelling in right arm only, no where else.  Please advise.  Uses [facility] Initial call taken by: Linde Gillis CMA Duncan Dull),  May 11, 2009 9:13 AM  Follow-up for Phone Call        We have to be careful with NSAIDs given her h/o lupus. Can she tolerate short course of narcotic like Vicodin? Follow-up by: Ruthe Mannan MD,  May 11, 2009 9:38 AM  Additional Follow-up for Phone Call Additional follow up Details #1::        Patient Advised.   She says she will try the Vicodin.  Please send to pharmacy listed above.  She was asking if she could use something for the inflammation.  I reiterated that we have to be careful with those because of her h/o lupus. Additional Follow-up by: Delilah Shan CMA Duncan Dull),  May 11, 2009 12:47 PM    Additional Follow-up for Phone Call Additional follow up Details #2::    Medication phoned to pharmacy. Lugene Fuquay CMA ([facility])  May 11, 2009 12:56 PM   New/Updated Medications: VICODIN 5-500 MG TABS (HYDROCODONE-ACETAMINOPHEN) 1 tab every 6 hours as needed for pain. Prescriptions: VICODIN 5-500 MG TABS (HYDROCODONE-ACETAMINOPHEN) 1 tab every 6 hours as needed for pain.  #30 x 0   Entered and Authorized by:   Ruthe Mannan MD   Signed by:   Delilah Shan CMA ([facility]) on 05/11/2009   Method used:   Handwritten   RxID:   [id number]

## 2010-03-07 NOTE — Letter (Signed)
Summary: Aundra Dubin MD  Aundra Dubin MD   Imported By: Lester Meade 02/08/2010 08:19:37  _____________________________________________________________________  External Attachment:    Type:   Image     Comment:   External Document

## 2010-03-07 NOTE — Consult Note (Signed)
Summary: [facility]   Imported By: Lanelle Bal 06/12/2009 10:50:51  _____________________________________________________________________  External Attachment:    Type:   Image     Comment:   External Document

## 2010-03-07 NOTE — Progress Notes (Signed)
----   Converted from flag ---- ---- 08/23/2009 8:07 AM, [name] MD wrote: please check lipid/ast/alt/K for 272 and hyperkalemia  ---- 08/22/2009 1:05 PM, Mills Koller wrote: Patient is coming in the AM for fasting labs, need orders and dx.Thanks, Terri ------------------------------

## 2010-03-07 NOTE — Assessment & Plan Note (Signed)
Summary: F/U [facility] [place] ON 08/11/09/[facility]   Vital Signs:  Patient profile:   45 year old female Height:      65 inches Weight:      337 pounds BMI:     56.28 O2 Sat:      95 % on Room air Temp:     98.5 degrees F oral Pulse rate:   96 / minute Pulse rhythm:   regular Resp:     20 per minute BP sitting:   116 / 86  (left arm) Cuff size:   large  Vitals Entered By: Lewanda Rife LPN (August 13, 2009 4:12 PM)  O2 Flow:  Room air CC: F?U [place] in [facility] on 08/11/09. Pt said breathing is better and no chest pain now. But upon alost any activity pt gets SOB.   History of Present Illness: here for f/u after trip to [facility] ER 7/09  has been decreasing prednisone -- was down to 1/2 tab daily  started to feel worse  thursday and friday steadily got worse - tried to go to work on sat -- very sob on exertion   pt went in with cp and sob -- her INR was subtheraputic at 1.2 (was not on lovenox )  pt had skipped her coumadin checks  still on lovenox now  is taking 7.5 comadin (doubled the dose )  has not had her lovenox today  no evidence of PE  L pleural eff was stable  breathing is better overall - but generally short of breath on exertion    pleural effusoin was better at check and did not need to drain anything    I spoke with Dr Dwain Sarna today about LN bx  he adv watching and re check CT in 3 mo - if stable - can continue to monitor   Allergies: 1)  ! 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 (04/1998) Superficial  thrombus, right leg (01/2000) Hosp- pericardial effusion (06/2003) UTI- hematuria work up (04/2004) CT pelvis- neg. 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 Eyes:  Denies blurring and eye irritation. CV:  Denies chest pain or discomfort, palpitations, and shortness of breath with exertion. Resp:  Complains of pleuritic and shortness of breath; denies cough and wheezing. Derm:  Denies itching, lesion(s), poor wound healing, and rash. Neuro:  Denies headaches and tingling. Psych:  mood is ok . Endo:  Denies cold intolerance, excessive thirst, excessive urination, and heat intolerance. Heme:  Denies abnormal bruising and bleeding.  Physical Exam  General:  morbidly obese- but well appearing  Head:  normocephalic, atraumatic, and no abnormalities observed.   Eyes:  vision grossly intact, pupils equal, pupils round, and pupils reactive to light.   Mouth:  pharynx pink and moist.   Neck:  supple with full rom and no masses or thyromegally, no JVD or carotid bruit  Chest Wall:  No deformities, masses, or tenderness noted. Lungs:  CTA with distant bs at both bases no  crackles or rales  Heart:  Normal rate and regular rhythm. S1 and S2 normal without gallop, murmur, click, rub or other extra sounds. Abdomen:  soft, non-tender, and normal bowel sounds.   Msk:  No deformity or scoliosis noted of thoracic or lumbar spine.   Pulses:  R and L carotid,radial,femoral,dorsalis pedis and posterior tibial pulses are full and equal bilaterally Extremities:  baseline edema in legs with hyperemia is   improved today Neurologic:  sensation intact to light touch, gait normal, and DTRs symmetrical and normal.   Skin:  Intact without suspicious lesions or rashes Cervical Nodes:  No lymphadenopathy noted Inguinal Nodes:  No significant adenopathy Psych:  normal affect, talkative and pleasant    Impression & Recommendations:  Problem # 1:  ENLARGEMENT OF LYMPH NODES (ICD-785.6) schedule f/u CT in 3 mo to observe as decided by Dr Dwain Sarna - if stable can continue to monitor- if larger will need LN removal Orders: Radiology Referral (Radiology)  Problem # 2:  ENCOUNTER FOR THERAPEUTIC DRUG MONITORING (ICD-V58.83)  INr today- on lovenox (for last INR 1.2 on 7/9) stressed imp of monitoring with hx of PE  Orders: Protime (95621HY)  Problem # 3:  DYSPNEA ON EXERTION (ICD-786.09) Assessment: New f/u with pulm -- hx of PE and lupus and also pleural eff - stable at ER visit  reveiwed the ER reports I had in detail with pt today will make appt update earlier if worse  Her updated medication list for this problem includes:    Enalapril Maleate 10 Mg Tabs (Enalapril maleate) ..... One by mouth daily  Complete Medication List: 1)  Paxil 40 Mg Tabs (Paroxetine hcl) .... Take 1by mouth once daily 2)  Aspirin 81 Mg Tbec (Aspirin) .... Take one by mouth daily 3)  Synthroid 200 Mcg Tabs (Levothyroxine sodium) .... Take one by mouth daily 4)  Enalapril Maleate 10 Mg Tabs (Enalapril maleate) .... One by mouth daily 5)  Clobestrol Oinment  .... Apply to ankles as needed 6)  Alprazolam 0.5 Mg Tabs (Alprazolam) .Marland Kitchen.. 1 by mouth up to two times a day as needed severe anxiety 7)  Coumadin 7.5 Mg Tabs (Warfarin sodium) .... Take one tablet by mouth daily 8)  Prilosec 20 Mg Cpdr (Omeprazole) .Marland Kitchen.. 1 by mouth once daily in ams 9)  Vicodin ?mg  .... At bedtime as needed 10)  Prednisone 20 Mg Tabs (Prednisone) .... Taking 1/2 tablet daily 11)  Plaquenil 200 Mg Tabs (Hydroxychloroquine sulfate) .... Take 1  tablet by mouth once a day 12)  Lovenox 150 Mg/ml Soln (Enoxaparin sodium) .... Inject two times a day as directed 13)  Crestor 10 Mg Tabs (Rosuvastatin calcium) .Marland Kitchen.. 1 by mouth once daily  Other Orders: Pulmonary Referral (Pulmonary)  Patient Instructions: 1)  we will set up appt with Dr Delford Field at check out  2)  if you shortness of breath worsens- please let me know or go to ER 3)  protime/ INR check today Prescriptions: PRILOSEC 20 MG CPDR (OMEPRAZOLE) 1 by mouth once daily in ams  #30 x 11   Entered and Authorized by:   [name] MD   Signed by:   [name] MD on 08/13/2009   Method used:   Electronically to  [facility]  [address] # [id number]* (retail)       [address]       [place], [place]  [address]       Ph: [phone] or [phone]       Fax: [phone]   RxID:   [id number]   Current Allergies (reviewed today): ! AVELOX AMOXICILLIN LIPITOR  Laboratory Results   Blood Tests   Date/Time Recieved: August 13, 2009 4:47 PM  Date/Time Reported: August 13, 2009 4:47 PM   PT: 17.4 s   (Normal Range: 10.6-13.4)  INR: 1.5   (Normal Range: 0.88-1.12   Therap INR: 2.0-3.5)      ANTICOAGULATION RECORD PREVIOUS REGIMEN & LAB RESULTS Anticoagulation Diagnosis:  P.E. on  05/24/2009 Previous INR Goal Range:  2.5-3.5 on  05/24/2009 Previous INR:  0.9 on  07/16/2009 Previous Coumadin Dose(mg):  5mg  x 5 days, then 7.5mg  x 2 days on  05/24/2009 Previous Regimen:  11.25 x 3 days, then she will bridge w/ Lovenox on  07/09/2009 Previous Coagulation Comments:  Holding x 4 days for surgery tomorrow on  07/16/2009  NEW REGIMEN & LAB RESULTS Current INR: 1.5 Regimen: 11.25x 3 days then 7.5mg   Coagulation Comments: . Provider: Lincoln Ginley      Repeat testing in: 4 days MEDICATIONS PAXIL 40 MG TABS (PAROXETINE HCL) Take 1by mouth once daily ASPIRIN 81 MG TBEC (ASPIRIN) Take one by mouth daily SYNTHROID 200 MCG  TABS (LEVOTHYROXINE SODIUM)  take one by mouth daily ENALAPRIL MALEATE 10 MG TABS (ENALAPRIL MALEATE) one by mouth daily * CLOBESTROL OINMENT apply to ankles as needed ALPRAZOLAM 0.5 MG TABS (ALPRAZOLAM) 1 by mouth up to two times a day as needed severe anxiety COUMADIN 7.5 MG TABS (WARFARIN SODIUM) take one tablet by mouth daily PRILOSEC 20 MG CPDR (OMEPRAZOLE) 1 by mouth once daily in ams * VICODIN ?MG at bedtime as needed PREDNISONE 20 MG TABS (PREDNISONE) taking 1/2 tablet daily PLAQUENIL 200 MG TABS (HYDROXYCHLOROQUINE SULFATE) Take 1 tablet by mouth once a day LOVENOX 150 MG/ML SOLN (ENOXAPARIN SODIUM) inject two times a day as directed CRESTOR 10 MG TABS (ROSUVASTATIN CALCIUM) 1 by mouth once daily  Dose has been reviewed with patient or caretaker during this visit.  Reviewed by: Mills Koller  Anticoagulation Visit Questionnaire      Coumadin dose missed/changed:  No      Abnormal Bleeding Symptoms:  No Any diet changes including alcohol intake, vegetables or greens since the last visit:  No Any illnesses or hospitalizations since the last visit:  No Any signs of clotting since the last visit (including chest discomfort, dizziness, shortness of breath, arm tingling, slurred speech, swelling or redness in leg):  No

## 2010-03-07 NOTE — Letter (Signed)
Summary: Application for Handicapped Placard  Application for Handicapped Placard   Imported By: Maryln Gottron 01/14/2010 08:39:55  _____________________________________________________________________  External Attachment:    Type:   Image     Comment:   External Document

## 2010-03-07 NOTE — Progress Notes (Signed)
Summary: coumadin  Phone Note Refill Request Message from:  Patient on May 28, 2009 4:14 PM  Refills Requested: Medication #1:  COUMADIN 7.5 MG TABS take one tablet by mouth daily [facility] [address]   Initial call taken by: Melody Comas,  May 28, 2009 4:14 PM  Follow-up for Phone Call        Sent Rx electronically to pharmacy Follow-up by: Linde Gillis CMA Duncan Dull),  May 28, 2009 4:19 PM    Prescriptions: COUMADIN 7.5 MG TABS (WARFARIN SODIUM) take one tablet by mouth daily  #30 x 6   Entered by:   Linde Gillis CMA (AAMA)   Authorized by:   [name] MD   Signed by:   Linde Gillis CMA (AAMA) on 05/28/2009   Method used:   Electronically to        [facility] # [id number]* (retail)       [address]       [place], [place]  [address]       Ph: [phone] or [phone]       Fax: [phone]   RxID:   [id number]

## 2010-03-07 NOTE — Assessment & Plan Note (Signed)
Summary: Pulmonary OV   Primary [name]/Referring [name]:  Cheryl Part MD  CC:  Follow up.  Pt states SOB has improved. c/o sore throat and feels like throat is "coated" - onset Tuesday.  Denies wheezing, chest tightness, and cough.  .  History of Present Illness: 45 yowf last smoked around 06/2009  with chronic  L empyema s/p VATS 2010.  Still with persistent disease L chest , not a candidate for decortication per TCTS [name]   Pt admitted 3/4-04/08/08.  Had sudden onset of feeling hot and flushed.  ? CVA ruled out but in ED found abn CXR ?PNA.  f/u ct chest showed loculated L pleural effusion.  Had been previously evaluated for same in past.  Tap showed dark brown fluid with negative pleural fluid cytology.  Had no positive micro data.  Dx lupus with nephritis and joint involvement.  Had serositis with pericardial and pleural effusion in the past.   Then was followed expectantly.   Cheryl [name] is rheum MD.    May 29, 2009  This patient has not been seen since June 2010. Patient did well until 6 weeks ago, when she became dizzy and went to work. She went to the emergency room an IV was placed in the right arm. Cardiac workup was negative and she was sent home. She then devleoped  right arm pain and swelling.  She was dx with   superficial thrombophlebitis. She was treated with tramadol only.  She did not improve and returned on 16 May 2008 to the ED with  chest pain in the front and back. A CT scan of the chest revealed  pulmonary embolism. Right lower lobe.   rx with Lovenox and Coumadin bridging. Over time she improved, but then on 24 April,  she returned emergently for pleuritic cp >  Chest CT scan was repeated and showed resolution of right lower lobe pulmonary embolism. She was however, present with right pleural effusion. She is still having ongoing dyspnea and chest pain. She has a dry cough. There is no fever, chills, or sweats. She is here now for further evaluation on referral  from the emergency room.  PAGE 2 >>>>>>>>>>>>>>>>>>.  August 15, 2009  to ED and had more dyspnea and pressure.  CXR no real change.  The pt still with  L loculated effusion.   The prednisone was down to 10mg  /d and this is when this occured.Not as much tightness as before.  If walks anywhere is out of breath.  Still on coumadin,  cont lovenox and coumadinAxillary LN was biopsied and was neg for lymphoma rec stay on same rx but stopped prednisone when rx ran out.    August 27, 2009 cc  since finished prednisone x 2 weeks worse doe room to room and now can't lie back to sleep on one pillow any more x 1 week with bilateral chest discomfort during deep breathing, minimal dry cough. --tx w/ steroid burst and d/c ace  August 31, 2009--Returns for persistent symptoms. Was seen 4 days ago, tx w/ steroid burst. Pt is very complicated. Since last 4 months has worsening of dyspnea. Initially dx w/ PE in 4/11 started on coumadin/lovenox bridge. CT showed smll right effusion and loculated left effusion. Over last 2 weeks continued w/ increased dsypnea. Tx w/ steroids. Xray showed locuated left effusion and increasing right effusion. She increased prednisone up to 40mg  , now on 30mg  -first day./ W. only minimal improvment. Still has DOE, and now  increased cough w/ creamy mucus.   September 03, 2009 cough and sob flat much better, still doe x > 100 ft which is a chronic complaint.  n         September 14, 2009 3:24 PM Still cannot breathe, if try to do anything is out of breath, has to lean forward. Is not coughing.  No mucus.  No real chest pain  LN bx was unrevealing Pt wiht recurrent R pleural effusion, when tapers prednisone effusion worsens.  Hx lupus   September 20, 2009 2:26 PM Pt underwent thoracentesis this week and then developed pain 1.5 L removed.  Mon was procedure.  Tue ok Wed felt pain on L side.  continue to worsen.  Itches on L side,  progressed throughout the day.  If breathe in is sharp pain R side is  not the area of pain the pt is back on the coumadin  INR 1.0 but still on therapeutic lovenox as a bridge. No real cough.  Dyspnea is better.   No fever or chills or sweats.  October 22, 2009 2:47 PM Pt in hospital for VATS of R pleural effusion.  Had lovenox bridging. 8/29- 10/09/09 per [name]. Still has pleuryx catheter.  There is minimal pleural fluid output so far.  The pt goes to the office weekly for TCTS recheck Dyspnea is better.   Pleuryx ? to stay until end of october. From Echart all pleural fluid c/s neg.  from vats.   INR 2.2   past week Pt was on keflex due to chest tube insertion site was infected. Pt also had candida in throat and rx diflucan and just finished this 9/19   11/19/09--Presents for an acute office visit. Complains of worsening SOB for 1 week (Decreased Prednisone to 10 mg for 1 week then SOB started again) - Clears throat alot - Mild wheezing - Denies cough - Increased Pred back to 20 mg /day on 11-15-09.  Last ov rec to taper steroids. When she got to 10mg  of prednisone noticed breathing not as good. Has ov with CVTS this afternoon. Today CXR w/ no increase in effusion. .  Last INR  was 2.8 10/26/09. She denies chest pain, calf pain. Has has some increased fluid retention esp in legs. Weight is up  7 lbs.  She deneis increased cough, congestion, fever. Denies chest pain,  orthopnea, hemoptysis, fever, n/v/d,  headache.   November 22, 2009 12:21 PM  Had developed more dyspnea and ? if fluid was building up.  Edema is better. weight is down 7 # Saw Truslow.  ? what to do prednisone.   Current Medications (verified): 1)  Paxil 40 Mg Tabs (Paroxetine Hcl) .... Take 1by Mouth Once Daily 2)  Aspirin 81 Mg Tbec (Aspirin) .... Take One By Mouth Daily 3)  Synthroid 200 Mcg  Tabs (Levothyroxine Sodium) .... Take One By Mouth Daily 4)  Clobestrol Oinment .... Apply To Ankles As Needed 5)  Alprazolam 0.5 Mg Tabs (Alprazolam) .Marland Kitchen.. 1 By Mouth Up To Two Times A Day As  Needed Severe Anxiety 6)  Coumadin 7.5 Mg Tabs (Warfarin Sodium) .... As Directed 7)  Prilosec 20 Mg Cpdr (Omeprazole) .Marland Kitchen.. 1 By Mouth Once Daily in Ams 8)  Percocet 5-325 Mg Tabs (Oxycodone-Acetaminophen) .Marland Kitchen.. 1-2 Every 4 Hours As Needed 9)  Plaquenil 200 Mg Tabs (Hydroxychloroquine Sulfate) .... Take 3 Tablet By Mouth Once A Day 10)  Prednisone 10 Mg  Tabs (Prednisone) .... 2 Once Daily 11)  Bystolic 10 Mg  Tabs (Nebivolol Hcl) .... One Tablet Daily 12)  Synthroid 25 Mcg Tabs (Levothyroxine Sodium) .Marland Kitchen.. 1 Every Am Before Breakfast 13)  Lasix 40 Mg Tabs (Furosemide) .... As Needed 14)  Lyrica 75 Mg Caps (Pregabalin) .... Take 1 Capsule By Mouth Once A Day  Allergies (verified): 1)  ! Avelox 2)  Amoxicillin 3)  Lipitor  Past History:  Past medical, surgical, family and social histories (including risk factors) reviewed, and no changes noted (except as noted below).  Past Medical History: Reviewed history from 09/03/2009 and no changes required. 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)  pleural effusions 2005 c/w lupus initial w/u Loculated empyema chronic on L.................................................Marland KitchenVanTright      -s/p VATS 06/2008  recurrent R effusion as pred tapered off July 2011 hyperlipidemia obesitly  glomerulonephritis  hypothyroid  renal - [place] rheum - [facility] pulm- [facility] thoracic surgeon- Cheryl Alla [facility] heme   Past Surgical History: Reviewed history from 04/25/2008 and no changes required. Stress echo- neg. (04/1998) Superficial thrombus, right leg (01/2000) Hosp- pericardial effusion (06/2003) UTI- hematuria work up (04/2004) CT pelvis- neg. (05/2004) Pleurocentesis (10/2004) thoracentesis 2010 with pneumonia  Family History: Reviewed history from 04/06/2008 and no changes required. Sister with lupus GM with DM  Social History: Reviewed history from  10/22/2009 and no changes required. 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  The patient denies shortness of breath at rest, productive cough, non-productive cough, coughing up blood, chest pain, irregular heartbeats, acid heartburn, indigestion, loss of appetite, weight change, abdominal pain, difficulty swallowing, sore throat, tooth/dental problems, headaches, nasal congestion/difficulty breathing through nose, sneezing, itching, ear ache, anxiety, depression, hand/feet swelling, joint stiffness or pain, rash, change in color of mucus, and fever.    Vital Signs:  Patient profile:   45 year old female Height:      65 inches Weight:      349 pounds BMI:     55.28 O2 Sat:      95 % on Room air Temp:     98.1 degrees F oral Pulse rate:   69 / minute BP sitting:   120 / 78  (right arm) Cuff size:   large  Vitals Entered By: Gweneth Dimitri RN (November 22, 2009 12:11 PM)  Nutrition Counseling: Patient's BMI is greater than 25 and therefore counseled on weight management options.  O2 Flow:  Room air CC: Follow up.  Pt states SOB has improved. c/o sore throat and feels like throat is "coated" - onset Tuesday.  Denies wheezing, chest tightness, cough.   Comments Medications reviewed with patient Daytime contact number verified with patient. Gweneth Dimitri RN  November 22, 2009 12:11 PM    Physical Exam  Additional Exam:  Gen: Pleasant, obese, in no distress,  normal affect wt 340 > 338 September 03, 2009 > 339 Aug 12>  338 Aug 18>>349 9/19>>356 10/17  ENT: No lesions,  mouth clear,  oropharynx clear, no postnasal drip Neck: No JVD, no TMG, no carotid bruits Lungs: No use of accessory muscles,  no change in decreased BS in LLL, open wound from prev chest tube better . , no redness.    Cardiovascular: RRR,  heart sounds normal, no murmur or gallops, venous insuff. changes.  Abdomen: soft and NT, no HSM,  BS normal Musculoskeletal: No deformities, no cyanosis or clubbing Neuro: alert, non focal Skin: Warm, scattered skin irritation, papules along arm.     Impression & Recommendations:  Problem # 1:  PLEURAL EFFUSION (ICD-511.9) Assessment Improved improved pleural effusion on R plan slow pred taper to 20mg /d and stay discuss with truslow cont lasix daily Orders: Est. Patient Level V ([address])  Medications Added to Medication List This Visit: 1)  Prednisone 10 Mg Tabs (Prednisone) .... 2 once daily 2)  Prednisone 10 Mg Tabs (Prednisone) .... Take as directed 3)  Lasix 40 Mg Tabs (Furosemide) .... One by mouth daily 4)  Prednisone 1 Mg Tabs (Prednisone) .... Take as directed in combination with 10 mg prednisone  Complete Medication List: 1)  Lyrica 75 Mg Caps (Pregabalin) .... Take 1 capsule by mouth once a day 2)  Paxil 40 Mg Tabs (Paroxetine hcl) .... Take 1by mouth once daily 3)  Aspirin 81 Mg Tbec (Aspirin) .... Take one by mouth daily 4)  Synthroid 200 Mcg Tabs (Levothyroxine sodium) .... Take one by mouth daily 5)  Coumadin 7.5 Mg Tabs (Warfarin sodium) .... As directed 6)  Prilosec 20 Mg Cpdr (Omeprazole) .Marland Kitchen.. 1 by mouth once daily in ams 7)  Plaquenil 200 Mg Tabs (Hydroxychloroquine sulfate) .... Take 3 tablet by mouth once a day 8)  Prednisone 10 Mg Tabs (Prednisone) .... Take as directed 9)  Bystolic 10 Mg Tabs (Nebivolol hcl) .... One tablet daily 10)  Synthroid 25 Mcg Tabs (Levothyroxine sodium) .Marland Kitchen.. 1 every am before breakfast 11)  Lasix 40 Mg Tabs (Furosemide) .... One by mouth daily 12)  Percocet 5-325 Mg Tabs (Oxycodone-acetaminophen) .Marland Kitchen.. 1-2 every 4 hours as needed 13)  Clobestrol Oinment  .... Apply to ankles as needed 14)  Alprazolam 0.5 Mg Tabs (Alprazolam) .Marland Kitchen.. 1 by mouth up to two times a day as needed severe anxiety 15)  Prednisone 1 Mg  Tabs (Prednisone) .... Take as directed in combination with 10 mg prednisone  Patient Instructions: 1)  Take lasix one daily 2)  Reduce prednisone to 15mg  daily then in 7days reduce by 1mg  every 5 days until 10mg  per day and stay 3)  I will discuss your case with Cheryl [name] 4)  No other medication changes 5)  Return [place] one month 6)  Watch salt intake Prescriptions: PREDNISONE 1 MG TABS (PREDNISONE) Take as directed in combination with 10 mg prednisone  #100 x 6   Entered and Authorized by:   Storm Frisk MD   Signed by:   Storm Frisk MD on 11/22/2009   Method used:   Print then Give to Patient   RxID:   [id number] PREDNISONE 1 MG TABS (PREDNISONE) Take as directed in combination with 10 mg prednisone  #100 x 6   Entered and Authorized by:   Storm Frisk MD   Signed by:   Storm Frisk MD on 11/22/2009   Method used:   Electronically to        [facility]  [address] # [id number]* (retail)       [address]       [place], [place]  [address]       Ph: [phone] or [phone]       Fax: [phone]   RxID:   [id number]

## 2010-03-07 NOTE — Progress Notes (Signed)
Summary: refill request for synthroid  Phone Note Refill Request Message from:  Fax from Pharmacy  Refills Requested: Medication #1:  SYNTHROID 200 MCG  TABS take one by mouth daily along with 50 micrograms tab Faxed form from [place] is on your shelf.  Initial call taken by: Lowella Petties CMA,  May 14, 2009 8:14 AM  Follow-up for Phone Call        form done and in nurse in box  Follow-up by: [name] MD,  May 14, 2009 8:44 AM  Additional Follow-up for Phone Call Additional follow up Details #1::        Completed form faxed to [phone] as instructed. Form is at my desk if needed later.Lewanda Rife LPN  May 14, 2009 11:53 AM     Prescriptions: SYNTHROID 200 MCG  TABS (LEVOTHYROXINE SODIUM) take one by mouth daily along with 50 micrograms tab  #90 x 3   Entered and Authorized by:   [name] MD   Signed by:   Lewanda Rife LPN on 09/81/1914   Method used:   Historical   RxID:   [id number]

## 2010-03-07 NOTE — Assessment & Plan Note (Signed)
Summary: Leg draining clear liquid after dog scratch/ri   Vital Signs:  Patient profile:   45 year old female Weight:      364.75 pounds Temp:     97.7 degrees F oral Pulse rate:   80 / minute Pulse rhythm:   regular BP sitting:   134 / 80  (left arm) Cuff size:   large  Vitals Entered By: Selena Batten Dance CMA Duncan Dull) (January 08, 2010 4:21 PM) CC: Leg leaking fluid Comments Right ankle area has serous fluid leaking from it. 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  Noticed at work [employer] during course of day.  Clear serous fluid, no bleeding, no pus.  + redness around wound.  + leg swelling.  Pt noticed retaining fluid.  On lasix 20mg  dialy.  On predisone, s/p fluid in lungs and tapped told lupus effusion.  h/o SLE.  On couamdin for h/o R leg DVT, PE.  Factor V Leiden.  Last INR 2.4.  has new puppy at home - thinks dog may have nicked leg.  No fevers/chills, no pain.  last blood work 11/2009 looking ok.  Scheduled to see Coladonato in January.  recently increased lasix to 40 x a few weeks 2/2 SOB, fluid in lungs, now back to 20mg  daily.  no SOB.  Current Medications (verified): 1)  Lyrica 75 Mg Caps (Pregabalin) .... Take 1 Capsule By Mouth Once A Day 2)  Paxil 40 Mg Tabs (Paroxetine Hcl) .... Take 1by Mouth Once Daily 3)  Aspirin 81 Mg Tbec (Aspirin) .... Take One By Mouth Daily 4)  Synthroid 200 Mcg  Tabs (Levothyroxine Sodium) .... Take One By Mouth Daily 5)  Synthroid 25 Mcg Tabs (Levothyroxine Sodium) .Marland Kitchen.. 1 Every Am Before Breakfast 6)  Coumadin 7.5 Mg Tabs (Warfarin Sodium) .... As Directed 7)  Prilosec 20 Mg Cpdr (Omeprazole) .Marland Kitchen.. 1 By Mouth Once Daily in Ams 8)  Plaquenil 200 Mg Tabs (Hydroxychloroquine Sulfate) .... Take 3 Tablet By Mouth Once A Day 9)   Prednisone 10 Mg  Tabs (Prednisone) .... Take As Directed 10)  Bystolic 10 Mg  Tabs (Nebivolol Hcl) .... One Tablet Daily 11)  Lasix 20 Mg Tabs (Furosemide) .... Take 1 Tablet By Mouth Once A Day 12)  Percocet 5-325 Mg Tabs (Oxycodone-Acetaminophen) .Marland Kitchen.. 1-2 Every 4 Hours As Needed 13)  Clobetasol Propionate 0.05 % Oint (Clobetasol Propionate) .... As Needed On Ankles 14)  Alprazolam 0.5 Mg Tabs (Alprazolam) .Marland Kitchen.. 1 By Mouth Up To Two Times A Day As Needed Severe Anxiety 15)  Prednisone 1 Mg Tabs (Prednisone) .... Take As Directed in Combination With 10 Mg Prednisone 16)  Clindamycin Hcl 150 Mg Caps (Clindamycin Hcl) .... Take 1 Tablet By Mouth Every 6 Hours  Allergies: 1)  ! Avelox 2)  Amoxicillin 3)  Lipitor  Past History:  Social History: Last updated: 10/22/2009 Marital Status: separated/divorced  Children: 3 Occupation: Psychologist, educational at [employer] Former Smoker.  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.................................................Marland KitchenVanTright      -  s/p VATS 06/2008  recurrent R effusion as pred tapered off July 2011     -Pleuryx catheter placement 9/11 VanTrigt...Marland KitchenMarland KitchenMarland Kitchenremoved  9/11 hyperlipidemia obesitly  glomerulonephritis  hypothyroid anemia  iron deficiency    -IV dextran [facility]  renal - [facility] rheum - [facility] pulm- [facility] thoracic surgeon- DR VanTright heme   Review of Systems       per HPI  Physical Exam  General:  morbidly obese- but well appearing, NAD Pulses:  1+ DP/PT bilaterally Extremities:  baseline edema in legs with hyperemia Skin:  slight knick about 1 cm length in epidermis, doesnt extend to dermis, R anterior distal leg just superior to ankle, continuous drainage of drops of  serous fluid.  No erythema/pus, fluctuance.  No other blister/skin disruption   Impression & Recommendations:  Problem # 1:  EDEMA (ICD-782.3)  likely 2/2 peripheral edema causing serous weeping.  recommended elevation of leg, decreased salt intake, staying well hydrated with water, and increase lasix to 40mg  x 1 wk.  Treat with continued gauze bandaging of leg.  Check CMP for kidney fnction, albumin to eval other causes of leg swelling.  hold off in protein in urine as to see nephrology next month, may wait until then.  Red flags to return discussed.  no need for unna boot now, no compression stockings for now.  Her updated medication list for this problem includes:    Lasix 20 Mg Tabs (Furosemide) .Marland Kitchen... Take 1 tablet by mouth once a day  Orders: TLB-BMP (Basic Metabolic Panel-BMET) (80048-METABOL) TLB-Hepatic/Liver Function Pnl (80076-HEPATIC)  Complete Medication List: 1)  Lyrica 75 Mg Caps (Pregabalin) .... Take 1 capsule by mouth once a day 2)  Paxil 40 Mg Tabs (Paroxetine hcl) .... Take 1by mouth once daily 3)  Aspirin 81 Mg Tbec (Aspirin) .... Take one by mouth daily 4)  Synthroid 200 Mcg Tabs (Levothyroxine sodium) .... Take one by mouth daily 5)  Synthroid 25 Mcg Tabs (Levothyroxine sodium) .Marland Kitchen.. 1 every am before breakfast 6)  Coumadin 7.5 Mg Tabs (Warfarin sodium) .... As directed 7)  Prilosec 20 Mg Cpdr (Omeprazole) .Marland Kitchen.. 1 by mouth once daily in ams 8)  Plaquenil 200 Mg Tabs (Hydroxychloroquine sulfate) .... Take 3 tablet by mouth once a day 9)  Prednisone 10 Mg Tabs (Prednisone) .... Take as directed 10)  Bystolic 10 Mg Tabs (Nebivolol hcl) .... One tablet daily 11)  Lasix 20 Mg Tabs (Furosemide) .... Take 1 tablet by mouth once a day 12)  Percocet 5-325 Mg Tabs (Oxycodone-acetaminophen) .Marland Kitchen.. 1-2 every 4 hours as needed 13)  Clobetasol Propionate 0.05 % Oint (Clobetasol propionate) .... As needed on ankles 14)  Alprazolam 0.5 Mg Tabs (Alprazolam) .Marland Kitchen.. 1 by mouth up to two  times a day as needed severe anxiety 15)  Prednisone 1 Mg Tabs (Prednisone) .... Take as directed in combination with 10 mg prednisone 16)  Clindamycin Hcl 150 Mg Caps (Clindamycin hcl) .... Take 1 tablet by mouth every 6 hours  Patient Instructions: 1)  I think this is from the swelling. 2)  Increase lasix to 40mg  x 1 wk. 3)  Elevate legs. 4)  Ensure staying hydrated.  limit salt. 5)  Keep area clean and dry/covered. 6)  Return if redness increases, pain increases, worsening blisters or other concerns.   Orders Added: 1)  TLB-BMP (Basic Metabolic Panel-BMET) [80048-METABOL] 2)  TLB-Hepatic/Liver Function Pnl [80076-HEPATIC] 3)  Est. Patient Level III [19147]     Current Allergies (reviewed today): ! AVELOX AMOXICILLIN LIPITOR

## 2010-03-07 NOTE — Medication Information (Signed)
Summary: Tax adviser   Imported By: Valinda Hoar 09/27/2009 14:11:03  _____________________________________________________________________  External Attachment:    Type:   Image     Comment:   External Document

## 2010-03-07 NOTE — Assessment & Plan Note (Signed)
Summary: HOSPTIAL FOLLOW WANT PROTIME/[facility]   Vital Signs:  Patient profile:   45 year old female Height:      65 inches Weight:      327.38 pounds BMI:     54.68 Temp:     98.6 degrees F oral Pulse rate:   76 / minute Pulse rhythm:   regular BP sitting:   110 / 84  (left arm) Cuff size:   large  Vitals Entered By: Linde Gillis CMA Duncan Dull) (May 24, 2009 3:03 PM) CC: hospital follow up   History of Present Illness: 45 yo female here for hospital follow up.  Admitted to [facility] from 4/13-4/14/2011 for right sided PE. Pt has a h/o Factor V Leiden mutation and Lupus along with h/o DVT years ago. Was also seen in our office earlier this month for right arm pain/swelling that was shown to be superficial thrombosis (no DVTs found).  Hospital records reviewed. Dopplers of lower extremities were neg for DVT.  She did have superficial thrombosis in right calf and thigh. CT angio did show right PE along with right axillary lymphadenopathy that has grown in size since prior imaging.  Pt was discharged home on Lovenox (last dose today) and coumadin. Currently taking 7.5 mg daily. No CP or SOB since discharge. No bleeding, other than her regular menstrual cycle today.  She has noticed a fruity taste in her mouth over last week or two. No increased thirst or urination.  She does not have diabetes that she is aware of, does FH of DM.  Current Medications (verified): 1)  Paxil 40 Mg Tabs (Paroxetine Hcl) .... Take 1 1/2 By Mouth Once Daily 2)  Aspirin 81 Mg Tbec (Aspirin) .... Take One By Mouth Daily 3)  Synthroid 200 Mcg  Tabs (Levothyroxine Sodium) .... Take One By Mouth Daily Along With 50 Micrograms Tab 4)  Enalapril Maleate 10 Mg Tabs (Enalapril Maleate) .... One By Mouth Daily 5)  Clobestrol Oinment .... Apply To Ankles As Needed 6)  Alprazolam 0.5 Mg Tabs (Alprazolam) .Marland Kitchen.. 1 By Mouth Up To Two Times A Day As Needed Severe Anxiety 7)  Coumadin 7.5 Mg Tabs (Warfarin Sodium) .... Take One  Tablet By Mouth Daily 8)  Prilosec Otc 20 Mg Tbec (Omeprazole Magnesium) .... Take One Tablet Daily  Allergies: 1)  ! Avelox 2)  Amoxicillin 3)  Lipitor  Review of Systems      See HPI General:  Denies chills and fever. Eyes:  Denies blurring. ENT:  Denies difficulty swallowing. CV:  Denies chest pain or discomfort, difficulty breathing at night, and difficulty breathing while lying down. Resp:  Denies cough, coughing up blood, pleuritic, and shortness of breath. GI:  Denies abdominal pain. GU:  Denies abnormal vaginal bleeding. MS:  Denies muscle weakness. Derm:  Denies rash. Neuro:  Denies headaches. Psych:  Complains of anxiety; denies depression, suicidal thoughts/plans, and thoughts of violence. Endo:  Denies excessive thirst and excessive urination. Heme:  Complains of enlarge lymph nodes; denies abnormal bruising, bleeding, fevers, pallor, and skin discoloration.  Physical Exam  General:  morbidly obese- but well appearing  Eyes:  vision grossly intact, pupils equal, pupils round, and pupils reactive to light.   Mouth:  pharynx pink and moist.   Lungs:  Normal respiratory effort, chest expands symmetrically. Lungs are clear to auscultation, no crackles or wheezes. Heart:  Normal rate and regular rhythm. S1 and S2 normal without gallop, murmur, click, rub or other extra sounds. Abdomen:  Bowel sounds positive,abdomen soft  and non-tender without masses, organomegaly or hernias noted. Extremities:  right lower arm- cord and erythema have resolved. Cervical Nodes:  No lymphadenopathy noted Axillary Nodes:  R axillary LN enlarged, non tender.   Inguinal Nodes:  No significant adenopathy Psych:  normal affect, talkative and pleasant    Impression & Recommendations:  Problem # 1:  PULMONARY EMBOLISM (ICD-415.19) Assessment New Time spent with patient 45 minutes, more than 50% of this time was spent counseling patient on anticoagulation therapy given her PE, h/o DVT and  clotting disorder along with her lymphadenopathy (below). Pt needs lifelong anticoagulation.  This was discussed with her and she is ok with it. INR today, see pt instructions for details.  Her updated medication list for this problem includes:    Aspirin 81 Mg Tbec (Aspirin) .Marland Kitchen... Take one by mouth daily    Coumadin 7.5 Mg Tabs (Warfarin sodium) .Marland Kitchen... Take one tablet by mouth daily  Orders: Venipuncture (04540) TLB-PT (Protime) (85610-PTP)  Problem # 2:  LYMPHADENOPATHY, RIGHT AXILLA (ICD-785.6) Assessment: Deteriorated enlarged since prior study. Pt is very anxious about this. I explained that we need to refer to surgery or IR because we can easily get a biopsy with a definitive diagnosis.  This will put our minds at ease so we won't go chasing a malignancy or other cause.  She agreed with plan. Orders: Surgical Referral (Surgery)  Problem # 3:  SCREENING FOR DIABETES MELLITUS (ICD-V77.1) a1c today. Orders: TLB-A1C / Hgb A1C (Glycohemoglobin) (83036-A1C)  Complete Medication List: 1)  Paxil 40 Mg Tabs (Paroxetine hcl) .... Take 1 1/2 by mouth once daily 2)  Aspirin 81 Mg Tbec (Aspirin) .... Take one by mouth daily 3)  Synthroid 200 Mcg Tabs (Levothyroxine sodium) .... Take one by mouth daily along with 50 micrograms tab 4)  Enalapril Maleate 10 Mg Tabs (Enalapril maleate) .... One by mouth daily 5)  Clobestrol Oinment  .... Apply to ankles as needed 6)  Alprazolam 0.5 Mg Tabs (Alprazolam) .Marland Kitchen.. 1 by mouth up to two times a day as needed severe anxiety 7)  Coumadin 7.5 Mg Tabs (Warfarin sodium) .... Take one tablet by mouth daily 8)  Prilosec Otc 20 Mg Tbec (Omeprazole magnesium) .... Take one tablet daily  Patient Instructions: 1)  Good to see you, Lawson Fiscal. 2)  Camelia Eng will get your INR and we can decide what coumadin dose you need. 3)  Come back for follow up INR frequently as you are adjusting your dose (Terri can tell you when to come back). 4)  See Shirlee Limerick on your way out to set  up your surgery referral. 5)  Have a happy easter.  Current Allergies (reviewed today): ! AVELOX AMOXICILLIN LIPITOR  Appended Document: HOSPTIAL FOLLOW WANT PROTIME/[facility]  Laboratory Results   Blood Tests   Date/Time Recieved: May 24, 2009 3:52 PM  Date/Time Reported: May 24, 2009 3:52 PM   PT: 14.7 s   (Normal Range: 10.6-13.4)  INR: 1.2   (Normal Range: 0.88-1.12   Therap INR: 2.0-3.5)      ANTICOAGULATION RECORD PREVIOUS REGIMEN & LAB RESULTS   Previous INR:  1.01 on  05/19/2009    NEW REGIMEN & LAB RESULTS Anticoag. Dx: P.E. Current INR Goal Range: 2.5-3.5 Current INR: 1.2 Current Coumadin Dose(mg): 5mg  x 5 days, then 7.5mg  x 2 days Regimen: 10mg  x 2 days then 7.5mg  qd  Provider: Marylynne Keelin      Repeat testing in: 5 days MEDICATIONS PAXIL 40 MG TABS (PAROXETINE HCL) Take 1 1/2 by mouth  once daily ASPIRIN 81 MG TBEC (ASPIRIN) Take one by mouth daily SYNTHROID 200 MCG  TABS (LEVOTHYROXINE SODIUM) take one by mouth daily along with 50 micrograms tab ENALAPRIL MALEATE 10 MG TABS (ENALAPRIL MALEATE) one by mouth daily * CLOBESTROL OINMENT apply to ankles as needed ALPRAZOLAM 0.5 MG TABS (ALPRAZOLAM) 1 by mouth up to two times a day as needed severe anxiety COUMADIN 7.5 MG TABS (WARFARIN SODIUM) take one tablet by mouth daily PRILOSEC OTC 20 MG TBEC (OMEPRAZOLE MAGNESIUM) take one tablet daily   Anticoagulation Visit Questionnaire      Coumadin dose missed/changed:  No      Abnormal Bleeding Symptoms:  No Any diet changes including alcohol intake, vegetables or greens since the last visit:  No Any illnesses or hospitalizations since the last visit:  Yes      Recent Illness/Hospitalizations:  New PE started Coumadin x 1 week ago Any signs of clotting since the last visit (including chest discomfort, dizziness, shortness of breath, arm tingling, slurred speech, swelling or redness in leg):  No

## 2010-03-07 NOTE — Assessment & Plan Note (Signed)
Summary: Pulmonary/ acute ov ? lupus pneumonitis, new right effusion   Primary Provider/Referring Provider:  [name] MD  CC:  Aacute visit.  Pt c/o increased SOB with exertion x 1 wk.  She gets out of breath walking from room to room.   She also c/o chest tightness and nausea.  Went to [facility] ER this am and had labs and cxr. .  History of Present Illness: 45 yowf last smoked around [age]   year old female with chronic L pleural empyema s/p VATS 2010.  Still with persistent disease L chest , not a candidate for decortication per [facility] Dr Donata Clay   Pt admitted 3/4-04/08/08.  Had sudden onset of feeling hot and flushed.  ? CVA ruled out but in ED found abn CXR ?PNA.  f/u ct chest showed loculated L pleural effusion.  Had been previously evaluated for same in past.  Tap showed dark brown fluid with negative pleural fluid cytology.  Had no positive micro data.    Dx lupus with nephritis and joint involvement.  Had serositis with pericardial and pleural effusion in the past.   Then was followed expectantly.   Dr Kellie Simmering is rheum MD. On no systemic rx at this time.  July 20, 2008 12:03 PM Pt underwent VATS for loculated empyema.  Pt has fluid removed.  C/S and cytology neg. Cavity has refilled.  Pt would need a decortication but has declined to proceed per surgery due to severe comorbidity due to weight issues. May 29, 2009  This patient has not been seen since June 2010. Patient did well until 6 weeks ago, when she became dizzy and went to work. She went to the emergency room an IV was placed in the right arm. Cardiac workup was negative and she was sent home. She then devleoped  right arm pain and swelling.  She was dx with   superficial thrombophlebitis. She was treated with tramadol only. l. She did not improve and returned on 45 April 2010 to the ED with  chest pain in the front and back. A CT scan of the chest reveal pulmonary embolism. Right lower lobe.   She was  rx with Lovenox and  Coumadin bridging. Over time she improved, but then on 24 April, she returned emergently for chest pain. She  had pleuritic-type syndrome complex. Chest CT scan was repeated and showed resolution of right lower lobe pulmonary embolism. She was however, present with right pleural effusion. She is still having ongoing dyspnea and chest pain. She has a dry cough. There is no fever, chills, or sweats. She is here now for further evaluation on referral from the emergency room.   this pt also has a R axillary LN which is in need of Biopsy per surgery   44 13, 201  to ED and had more dyspnea and pressure.  CXR no real change.  The pt still with  L loculated effusion.   The prednisone was down to 10mg  /d and this is when this occured. Not as much tightness as before.  If walks anywhere is out of breath.  Still on coumadin,  cont lovenox and coumadin  LN was biopsied and was neg for lymphoma no cough  August 27, 2009 cc  since finished prednisone x 2 weeks worse doe room to room and now can't lie back to sleep on one pillow any more x 1 week with bilateral chest discomfort during deep breathing, minimal dry cough. Pt denies any significant sore throat,  dysphagia, itching, sneezing,  nasal congestion or excess secretions,  fever, chills, sweats, unintended wt loss, pleuritic or exertional cp, hempoptysis, change in activity tolerance  orthopnea pnd or leg swelling    Current Medications (verified): 1)  Paxil 40 Mg Tabs (Paroxetine Hcl) .... Take 1by Mouth Once Daily 2)  Aspirin 81 Mg Tbec (Aspirin) .... Take One By Mouth Daily 3)  Synthroid 200 Mcg  Tabs (Levothyroxine Sodium) .... Take One By Mouth Daily 4)  Enalapril Maleate 10 Mg Tabs (Enalapril Maleate) .... One By Mouth Daily 5)  Clobestrol Oinment .... Apply To Ankles As Needed 6)  Alprazolam 0.5 Mg Tabs (Alprazolam) .Marland Kitchen.. 1 By Mouth Up To Two Times A Day As Needed Severe Anxiety 7)  Coumadin 7.5 Mg Tabs (Warfarin Sodium) .... Take One Tablet By  Mouth Daily 8)  Prilosec 20 Mg Cpdr (Omeprazole) .Marland Kitchen.. 1 By Mouth Once Daily in Ams 9)  Vicodin ?mg .... At Bedtime As Needed 10)  Plaquenil 200 Mg Tabs (Hydroxychloroquine Sulfate) .... Take 1 Tablet By Mouth Once A Day 11)  Crestor 10 Mg Tabs (Rosuvastatin Calcium) .Marland Kitchen.. 1 By Mouth Once Daily  Allergies (verified): 1)  ! Avelox 2)  Amoxicillin 3)  Lipitor  Past History:  Past Medical History: Anxiety DVT, hx of- during pregnancy 2002 - ?factor 5 leiden (sees heme) HTN SLE (renal GN, hx of pericardial eff), then pleural effusions Loculated empyema chronic     -s/p VATS 5/10 hyperlipidemia obesitly  glomerulonephritis  hypothyroid  renal - [facility] rheum - [facility] pulm- [facility] thoracic surgeon- DR VanTright heme   Vital Signs:  Patient profile:   45 year old female Weight:      339 pounds O2 Sat:      96 % on Room air Temp:     98.2 degrees F oral Pulse rate:   106 / minute BP sitting:   114 / 80  (left arm) Cuff size:   large  Vitals Entered By: Vernie Murders (August 27, 2009 10:29 AM)  O2 Flow:  Room air  Physical Exam  Additional Exam:  Gen: Pleasant, obese, in no distress,  normal affect ENT: No lesions,  mouth clear,  oropharynx clear, no postnasal drip Neck: No JVD, no TMG, no carotid bruits Lungs: No use of accessory muscles, no dullness to percussion, decreased BS at both bases Cardiovascular: RRR, heart sounds normal, no murmur or gallops, no peripheral edema Abdomen: soft and NT, no HSM,  BS normal Musculoskeletal: No deformities, no cyanosis or clubbing Neuro: alert, non focal Skin: Warm, no lesions or rashes    CXR  Procedure date:  08/27/2009  Findings:      bilateral pleural effusions  Impression & Recommendations:  Problem # 1:  PLEURAL EFFUSION (ICD-511.9)  Now on right flared with steroid taper and no evidence of new pulmonary emboli or pneuomia , can't tap on anticoagulants and probably don't need to at this point for clinical dx  lupus pleuritiss  See instructions for specific recommendations   Problem # 2:  HYPERTENSION, ESSENTIAL NOS (ICD-401.9)  The following medications were removed from the medication list:    Enalapril Maleate 10 Mg Tabs (Enalapril maleate) ..... One by mouth daily Her updated medication list for this problem includes:    Bystolic 5 Mg Tabs (Nebivolol hcl) ..... One tablet daily   ACE inhibitors are problematic in  pts with airway complaints because  even experienced pulmonologists can't always distinguish ace effects from copd/asthma.  By themselves they don't actually cause a  problem, much like oxygen can't by itself start a fire, but they certainly serve as a powerful catalyst or enhancer for any "fire"  or inflammatory process in the upper airway, be it caused by an ET  tube or more commonly reflux (especially in the obese or pts with known GERD or who are on biphoshonates).  for now try off ace and on Bystolic, the most beta -1  selective Beta blocker available in sample form, with bisoprolol the most selective generic choice  on the market.   Medications Added to Medication List This Visit: 1)  Prednisone 10 Mg Tabs (Prednisone) .... 4 each am x 3 days,  3 x 3 days, 2x3 days, and 1x3 days 2)  Bystolic 5 Mg Tabs (Nebivolol hcl) .... One tablet daily  Other Orders: Est. Patient Level IV (16109)  Patient Instructions: 1)  Prednisone 10 mg  4 each am x 3 days,  3 x 3 days, 2x3 days, and 1x3 days 2)  stop enalapril 3)  Start Bystolic 5 mg daily in place of enalapril 4)  Please schedule a follow-up appointment in 2 weeks, to ER in meantime if condition worsens Prescriptions: PREDNISONE 10 MG  TABS (PREDNISONE) 4 each am x 3 days,  3 x 3 days, 2x3 days, and 1x3 days  #30 x 0   Entered and Authorized by:   Nyoka Cowden MD   Signed by:   Nyoka Cowden MD on 08/27/2009   Method used:   Electronically to        [facility]  [address] [id number]* (retail)       [address]        [place], [place]  [address]       Ph: [phone] or [phone]       Fax: [phone]   RxID:   [id number]

## 2010-03-07 NOTE — Letter (Signed)
Summary: Out of Work  [facility]  [address]   [place], [place] [address]   Phone: [phone]  Fax: [phone]    05/29/2009  TO: WHOM IT MAY CONCERN  RE: [name] [place]ax [address] [place]       The above named individual is currently under my care and will be out of work    FROM: 05/29/2009   THROUGH: indefinite        REASON: lung problem    MAY RETURN ON:  uncertain     If you have any further questions or need additional information, please call.     Sincerely,   [name] MD typed by: Storm Frisk MD

## 2010-03-07 NOTE — Progress Notes (Signed)
Summary: wants handicap sticker  Phone Note Call from Patient Call back at Home Phone [phone]   [name] Call For: [name] MD Summary of Call: Patient is asking if you could give her a temporary handi cap sticker. She says that she is still having some shortness of breath and wheezing  after alot of walking. Please call patient when ready.  Initial call taken by: Melody Comas,  January 04, 2010 3:41 PM  Follow-up for Phone Call        done and in IN box Follow-up by: [name] MD,  January 04, 2010 4:29 PM  Additional Follow-up for Phone Call Additional follow up Details #1::        form left at front desk.  Left message for patient to call back. Lewanda Rife LPN  January 07, 2010 8:56 AM   Patient notified as instructed by telephone. Lewanda Rife LPN  January 07, 2010 5:00 PM

## 2010-03-07 NOTE — Letter (Signed)
Summary: Return to Work  [employer]  [address]   [place], [place] [address]   Phone: [phone]  Fax: [phone]    10/22/2009  TO: Leodis Sias IT MAY CONCERN  RE: ZYNIAH FERRAIOLO [address] [place]   The above named individual is under my medical care and may return to work on: 10/30/09    If you have any further questions or need additional information, please call.     Sincerely,   Shan Levans

## 2010-03-07 NOTE — Assessment & Plan Note (Signed)
Summary: Pulmonary OV   Primary Provider/Referring Provider:  Colon Flattery Tower MD  CC:  2 week follow up.  Pt states she is still having SOB with activity but states this is stable.  Chest tightness and nausea have resolved.  Denies cough.  .  History of Present Illness: 45 yowf last smoked around 06/2009  with chronic  L empyema s/p VATS 2010.  Still with persistent disease L chest , not a candidate for decortication per [facility] Dr Donata Clay   Pt admitted 3/4-04/08/08.  Had sudden onset of feeling hot and flushed.  ? CVA ruled out but in ED found abn CXR ?PNA.  f/u ct chest showed loculated L pleural effusion.  Had been previously evaluated for same in past.  Tap showed dark brown fluid with negative pleural fluid cytology.  Had no positive micro data.    Dx lupus with nephritis and joint involvement.  Had serositis with pericardial and pleural effusion in the past.   Then was followed expectantly.   Dr Kellie Simmering is rheum MD.    May 29, 2009  This patient has not been seen since June 2010. Patient did well until 6 weeks ago, when she became dizzy and went to work. She went to the emergency room an IV was placed in the right arm. Cardiac workup was negative and she was sent home. She then devleoped  right arm pain and swelling.  She was dx with   superficial thrombophlebitis. She was treated with tramadol only.  She did not improve and returned on 16 May 2008 to the ED with  chest pain in the front and back. A CT scan of the chest revealed  pulmonary embolism. Right lower lobe.   rx with Lovenox and Coumadin bridging. Over time she improved, but then on 24 April,  she returned emergently for pleuritic cp >  Chest CT scan was repeated and showed resolution of right lower lobe pulmonary embolism. She was however, present with right pleural effusion. She is still having ongoing dyspnea and chest pain. She has a dry cough. There is no fever, chills, or sweats. She is here now for further evaluation on referral  from the emergency room.  July 13, 201  to ED and had more dyspnea and pressure.  CXR no real change.  The pt still with  L loculated effusion.   The prednisone was down to 10mg  /d and this is when this occured. Not as much tightness as before.  If walks anywhere is out of breath.  Still on coumadin,  cont lovenox and coumadin Axillary LN was biopsied and was neg for lymphoma rec stay on same rx but stopped prednisone when rx ran out.     August 27, 2009 cc  since finished prednisone x 2 weeks worse doe room to room and now can't lie back to sleep on one pillow any more x 1 week with bilateral chest discomfort during deep breathing, minimal dry cough. --tx w/ steroid burst and d/c ace  August 31, 2009--Returns for persistent symptoms. Was seen 4 days ago, tx w/ steroid burst. Pt is very complicated. Since last 4 months has worsening of dyspnea. Initially dx w/ PE in 4/11 started on coumadin/lovenox bridge. CT showed smll right effusion and loculated left effusion. Over last 2 weeks continued w/ increased dsypnea. Tx w/ steroids. Xray showed locuated left effusion and increasing right effusion. She increased prednisone up to 40mg  , now on 30mg  -first day./ W. only minimal improvment. Still has  DOE, and now increased cough w/ creamy mucus.   September 03, 2009 cough and sob flat much better, still doe x > 100 ft which is a chronic complaint.  no leg swelling aches in joints or skin rash. Pt denies any significant sore throat, dysphagia, itching, sneezing,  nasal congestion or excess secretions,  fever, chills, sweats, unintended wt loss, pleuritic or exertional cp, hempoptysis, variability in activity tolerance  orthopnea pnd . Pt also denies any obvious fluctuation in symptoms with weather or environmental change or other alleviating or aggravating factors.          September 14, 2009 3:24 PM Still cannot breathe, if try to do anything is out of breath, has to lean forward. Is not coughing.  No mucus.  No  real chest pain  LN bx was unrevealing Pt wiht recurrent R pleural effusion, when tapers prednisone effusion worsens.  Hx lupus    Preventive Screening-Counseling & Management  Alcohol-Tobacco     Smoking Status: quit     Packs/Day: 1-2 cigs a day     Year Started: 1986     Year Quit: 2002  Current Medications (verified): 1)  Paxil 40 Mg Tabs (Paroxetine Hcl) .... Take 1by Mouth Once Daily 2)  Aspirin 81 Mg Tbec (Aspirin) .... Take One By Mouth Daily 3)  Synthroid 200 Mcg  Tabs (Levothyroxine Sodium) .... Take One By Mouth Daily 4)  Clobestrol Oinment .... Apply To Ankles As Needed 5)  Alprazolam 0.5 Mg Tabs (Alprazolam) .Marland Kitchen.. 1 By Mouth Up To Two Times A Day As Needed Severe Anxiety 6)  Coumadin 7.5 Mg Tabs (Warfarin Sodium) .... Take One Tablet By Mouth Daily 7)  Prilosec 20 Mg Cpdr (Omeprazole) .Marland Kitchen.. 1 By Mouth Once Daily in Ams 8)  Vicodin ?mg .... At Bedtime As Needed 9)  Plaquenil 200 Mg Tabs (Hydroxychloroquine Sulfate) .... Take 3 Tablet By Mouth Once A Day 10)  Crestor 10 Mg Tabs (Rosuvastatin Calcium) .Marland Kitchen.. 1 By Mouth Once Daily 11)  Prednisone 10 Mg  Tabs (Prednisone) .... 2 Daily 12)  Bystolic 10 Mg  Tabs (Nebivolol Hcl) .... One Tablet Daily 13)  Synthroid 25 Mcg Tabs (Levothyroxine Sodium) .Marland Kitchen.. 1 Every Am Before Breakfast  Allergies (verified): 1)  ! Avelox 2)  Amoxicillin 3)  Lipitor  Past History:  Past medical, surgical, family and social histories (including risk factors) reviewed, and no changes noted (except as noted below).  Past Medical History: Reviewed history from 09/03/2009 and no changes required. 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 Stress echo- neg. (04/1998) Superficial thrombus, right leg (01/2000) Hosp- pericardial effusion (06/2003) UTI- hematuria work up (04/2004) CT pelvis- neg. (05/2004) Pleurocentesis (10/2004) thoracentesis 2010 with pneumonia  Family History: Reviewed history from 04/06/2008 and no changes required. Sister with lupus GM with DM  Social History: Reviewed history from 04/06/2008 and no changes required. 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  The patient denies productive cough, non-productive cough, coughing up blood, chest pain, irregular heartbeats, acid heartburn, indigestion, loss of appetite, weight change, abdominal pain, difficulty swallowing, sore throat, tooth/dental problems, headaches, nasal congestion/difficulty breathing through nose, sneezing, itching, ear ache, anxiety, depression, hand/feet swelling, joint stiffness or pain, rash, change in color of mucus, and fever.    Vital Signs:  Patient profile:   45 year old female Height:      65 inches (165.10 cm) Weight:      339.38 pounds (154.26 kg) BMI:     56.68 O2 Sat:      98 % on Room air Temp:     98.3 degrees F (36.83 degrees C) oral Pulse rate:   68 / minute BP sitting:   122 / 90  (left arm) Cuff size:   large  Vitals Entered By: Gweneth Dimitri RN (September 14, 2009 3:19 PM)  O2 Flow:  Room air CC: 2 week follow up.  Pt states she is still having SOB with activity but states this is stable.  Chest tightness and nausea have resolved.  Denies cough.   Comments Medications reviewed with patient Daytime  contact number verified with patient. Gweneth Dimitri RN  September 14, 2009 3:19 PM    Physical Exam  Additional Exam:  Gen: Pleasant, obese, in no distress,  normal affect wt 340 > 338 September 03, 2009 > 339 Aug 12 ENT: No lesions,  mouth clear,  oropharynx clear, no postnasal drip Neck: No JVD, no TMG, no carotid bruits Lungs: No use of accessory muscles, decreased BS in bases L>R Cardiovascular: RRR, heart sounds normal, no murmur or gallops, venous insuff. changes.  Abdomen: soft and NT, no HSM,  BS normal Musculoskeletal: No deformities, no cyanosis or clubbing Neuro: alert, non focal Skin: Warm, scattered skin irritation, papules along arm.     Impression & Recommendations:  Problem # 1:  PLEURAL EFFUSION (ICD-511.9) Assessment Deteriorated progressive R pleural effusion in setting of lupus flare plan repulse prednisone lovenox bridge with coumadin held u/s guided thoracentesis on r Orders: Est. Patient Level V ([address]109) TLB-CBC Platelet - w/Differential (85025-CBCD) TLB-Sedimentation Rate (ESR) (85652-ESR) T-Antinuclear Antib (ANA) (60454-09811) Radiology Referral (Radiology)  Medications Added to Medication List This Visit: 1)  Coumadin 7.5 Mg Tabs (Warfarin sodium) .... Take one tablet by mouth daily hold for 3 days prior to thoracentesis, and start lovenox subcutaneously 2)  Plaquenil 200 Mg Tabs (Hydroxychloroquine sulfate) .... Take 3 tablet by mouth once a day 3)  Prednisone 10 Mg Tabs (Prednisone) .... 6 per day for 4 days, then 5 per day for 4 days, then 4 per day for 4 days then 3 per day and stay 4)  Lovenox 150 Mg/ml Soln (Enoxaparin sodium) .... 150mg  subcutaneously twice daily  Complete Medication List: 1)  Paxil 40 Mg Tabs (Paroxetine hcl) .... Take 1by mouth once daily 2)  Aspirin 81 Mg Tbec (Aspirin) .... Take one by mouth daily 3)  Synthroid 200 Mcg Tabs (Levothyroxine sodium) .... Take one by mouth daily 4)  Clobestrol Oinment  .... Apply to ankles as  needed 5)  Alprazolam 0.5 Mg Tabs (Alprazolam) .Marland Kitchen.. 1 by mouth up to two times a day as needed severe anxiety 6)  Coumadin 7.5 Mg Tabs (Warfarin sodium) .... Take one tablet by mouth daily hold for 3 days prior to thoracentesis, and start lovenox subcutaneously 7)  Prilosec 20 Mg Cpdr (Omeprazole) .Marland Kitchen.. 1 by  mouth once daily in ams 8)  Vicodin ?mg  .... At bedtime as needed 9)  Plaquenil 200 Mg Tabs (Hydroxychloroquine sulfate) .... Take 3 tablet by mouth once a day 10)  Crestor 10 Mg Tabs (Rosuvastatin calcium) .Marland Kitchen.. 1 by mouth once daily 11)  Prednisone 10 Mg Tabs (Prednisone) .... 6 per day for 4 days, then 5 per day for 4 days, then 4 per day for 4 days then 3 per day and stay 12)  Bystolic 10 Mg Tabs (Nebivolol hcl) .... One tablet daily 13)  Synthroid 25 Mcg Tabs (Levothyroxine sodium) .Marland Kitchen.. 1 every am before breakfast 14)  Lovenox 150 Mg/ml Soln (Enoxaparin sodium) .... 150mg  subcutaneously twice daily  Patient Instructions: 1)  A thoracentesis will be scheduled of Right lung 2)  Hold coumadin for 3 days prior to procedure 3)  Start Lovenox subcutaneously when coumadin held, stop lovenox 12hours prior to procedure or per instructions from Radiology 4)  Increase prednisone to 60mg  per day and reduce by one 10mg  tab every 4 days until 3 a day and stay 5)  Labs today 6)  Return after thoracentesis is obtained Prescriptions: PREDNISONE 10 MG  TABS (PREDNISONE) 6 per day for 4 days, then 5 per day for 4 days, then 4 per day for 4 days then 3 per day and stay  #100 x 6   Entered and Authorized by:   Storm Frisk MD   Signed by:   Storm Frisk MD on 09/14/2009   Method used:   Electronically to        [facility]  [address] [facility]* (retail)       [address]       [place], [place]  [address]       Ph: [phone] or [phone]       Fax: [phone]   RxID:   [id number] LOVENOX 150 MG/ML SOLN (ENOXAPARIN SODIUM) 150mg  subcutaneously twice daily  #10  doses x 0   Entered and Authorized by:   Storm Frisk MD   Signed by:   Storm Frisk MD on 09/14/2009   Method used:   Print then Give to Patient   RxID:   [id number]

## 2010-03-07 NOTE — Letter (Signed)
Summary: Stacey Drain MD  Stacey Drain MD   Imported By: Sherian Rein 11/29/2009 08:35:47  _____________________________________________________________________  External Attachment:    Type:   Image     Comment:   External Document

## 2010-03-07 NOTE — Progress Notes (Signed)
  Phone Note From Other Clinic   Summary of Call: call from surgical center [name]  we disc [name] and her axillary adenopathy that is likely mulitfactorial  he agrees a bx is warranted - but feels that beginning with an US guided percutaneous bx is the better option to start since there is less risk and anesthesia -- then proceed to deeper bx only if needed the US guided bx could be done by any of the interventional radiol at [facility] imaging and he does not have a preference he also thinks we shoud wait about a month for her to stabilize on her coumadin (recent PE) before proceeding  she will need to be briefly bridged with lovenox of course  Initial call taken by: Cheryl Part MD,  Jun 06, 2009 2:09 PM  Follow-up for Phone Call        I plan to run this by [name] to see what she thinks  then please send back to me with her thoughts and then I will get moving on proper referrals  -- [name] That sounds reasonable. Cheryl Mannan MD  Jun 06, 2009 2:14 PM  Follow-up by: Cheryl Part MD,  Jun 06, 2009 2:10 PM  Additional Follow-up for Phone Call Additional follow up Details #1::        I spoke to the pt - and she is also waiting to hear from [name] tomorrw about the plan for thoracentesis and how that would all tie in  I will route this phone note to him as well an wait for the word pt says overall she is feeling quite good today  Additional Follow-up by: Cheryl Part MD,  Jun 06, 2009 3:55 PM    Additional Follow-up for Phone Call Additional follow up Details #2::    I spoke to [name] as well  we both will wait one month to do both procedures at once unless her dyspnea and chest pain worsens I will call the pt   Follow-up by: Cheryl Frisk MD,  Jun 07, 2009 10:41 AM  Additional Follow-up for Phone Call Additional follow up Details #3:: Details for Additional Follow-up Action Taken: thanks for doing that -- do you want me to go ahead and sched the bx for a  month out -- or do you want to do it? (or wait until you get your proceedure scheduduled?-- [name]  let me know when you schedule the LN bx and I will sched u/s guided thora same week while off coumadin  I will go ahead and get that scheduled -- and send ref to [facility] Additional Follow-up by: Cheryl Part MD,  Jun 08, 2009 8:02 AM

## 2010-03-07 NOTE — Assessment & Plan Note (Signed)
Summary: Pulmonary OV   Primary Provider/Referring Provider:  [name] MD  CC:  Follow up.  Pt states she was seen in [facility] ER on April 13 for PE and April 24 for plueral effusion.  Pt c/o stabbing back and increased SOB with activity since Sunday morning.  Marland Kitchen  History of Present Illness: Pulmonary OV        This is a 45  year old female with chronic L pleural empyema s/p VATS 2010.  Still with persistent disease L chest , not a candidate for decortication per TCTS Dr Donata Clay   Pt admitted 3/4-04/08/08.  Had sudden onset of feeling hot and flushed.  ? CVA ruled out but in ED found abn CXR ?PNA.  f/u ct chest showed loculated L pleural effusion.  Had been previously evaluated for same in past.  Tap showed dark brown fluid with negative pleural fluid cytology.  Had no positive micro data.    Dx lupus with nephritis and joint involvement.  Had serositis with pericardial and pleural effusion in the past.   Then was followed expectantly.   Dr Kellie Simmering is rheum MD. On no systemic rx at this time.  July 20, 2008 12:03 PM Pt underwent VATS for loculated empyema.  Pt has fluid removed.  C/S and cytology neg. Cavity has refilled.  Pt would need a decortication but has declined to proceed per surgery due to severe comorbidity due to weight issues. Pt denies any significant sore throat, nasal congestion or excess secretions, fever, chills, sweats, unintended weight loss, pleurtic or exertional chest pain, orthopnea PND, or leg swelling Pt denies any increase in rescue therapy over baseline, denies waking up needing it or having any early am or nocturnal exacerbations of coughing/wheezing/or dyspnea. May 29, 2009 9:56 AM This patient has not been seen since June 2010. Patient did well until 6 weeks ago, when she became dizzy and went to work. She went to the emergency room an IV was placed in the right arm. Cardiac workup was negative and she was sent home. She then devleoped  right arm pain and  swelling.  She was dx with   superficial thrombophlebitis. She was treated with tramadol only. l. She did not improve and returned on 16 May 2008 to the ED with  chest pain in the front and back. A CT scan of the chest reveal pulmonary embolism. Right lower lobe.   She was  rx with Lovenox and Coumadin bridging. Over time she improved, but then on 24 April, she returned emergently for chest pain. She  had pleuritic-type syndrome complex. Chest CT scan was repeated and showed resolution of right lower lobe pulmonary embolism. She was however, present with right pleural effusion. She is still having ongoing dyspnea and chest pain. She has a dry cough. There is no fever, chills, or sweats. She is here now for further evaluation on referral from the emergency room.   this pt also has a R axillary LN which is in need of Biopsy per surgery  Preventive Screening-Counseling & Management  Alcohol-Tobacco     Smoking Status: never  Current Medications (verified): 1)  Paxil 40 Mg Tabs (Paroxetine Hcl) .... Take 1by Mouth Once Daily 2)  Aspirin 81 Mg Tbec (Aspirin) .... Take One By Mouth Daily 3)  Synthroid 200 Mcg  Tabs (Levothyroxine Sodium) .... Take One By Mouth Daily 4)  Enalapril Maleate 10 Mg Tabs (Enalapril Maleate) .... One By Mouth Daily 5)  Clobestrol Oinment .... Apply To  Ankles As Needed 6)  Alprazolam 0.5 Mg Tabs (Alprazolam) .Marland Kitchen.. 1 By Mouth Up To Two Times A Day As Needed Severe Anxiety 7)  Coumadin 7.5 Mg Tabs (Warfarin Sodium) .... Take One Tablet By Mouth Daily 8)  Prilosec Otc 20 Mg Tbec (Omeprazole Magnesium) .... Take One Tablet Daily 9)  Vicodin ?mg .... At Bedtime  Allergies (verified): 1)  ! Avelox 2)  Amoxicillin 3)  Lipitor  Past History:  Past medical, surgical, family and social histories (including risk factors) reviewed, and no changes noted (except as noted below).  Past Medical History: Reviewed history from 12/25/2008 and no changes required. Anxiety DVT, hx  of- during pregnancy 2002 - ?factor 5 leiden (sees heme) HTN SLE (renal GN, hx of pericardial eff), then pleural eff  Loculated empyema chronic     -s/p VATS 5/10 hyperlipidemia obesitly  glomerulonephritis  hypothyroid  renal - [facility] rheum - [facility] pulm- [facility] thoracic surgeon- DR VanTright heme   Past Surgical History: Reviewed history from 04/25/2008 and no changes required. Stress echo- neg. (04/1998) Superficial thrombus, right leg (01/2000) Hosp- pericardial effusion (06/2003) UTI- hematuria work up (04/2004) CT pelvis- neg. (05/2004) Pleurocentesis (10/2004) thoracentesis 2010 with pneumonia  Family History: Reviewed history from 04/06/2008 and no changes required. Sister with lupus GM with DM  Social History: Reviewed history from 04/06/2008 and no changes required. 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  The patient denies shortness of breath at rest, productive cough, coughing up blood, irregular heartbeats, acid heartburn, indigestion, loss of appetite, weight change, abdominal pain, difficulty swallowing, sore throat, tooth/dental problems, headaches, nasal congestion/difficulty breathing through nose, sneezing, itching, ear ache, anxiety, depression, hand/feet swelling, joint stiffness or pain, rash, change in color of mucus, and fever.    Vital Signs:  Patient profile:   45 year old female Height:      65 inches Weight:      331 pounds BMI:     55.28 O2 Sat:      99 % on Room air Temp:     98.9 degrees F oral Pulse rate:   101 / minute BP sitting:   130 / 70  (left arm) Cuff size:   large  Vitals Entered By: Gweneth Dimitri RN (May 29, 2009 9:53 AM)  O2 Flow:  Room air CC: Follow up.  Pt states she was seen in [facility] ER on April 13 for PE and  April 24 for plueral effusion.  Pt c/o stabbing back and increased SOB with activity since Sunday morning.   Comments Medications reviewed with patient Daytime contact number verified with patient. Gweneth Dimitri RN  May 29, 2009 9:54 AM    Physical Exam  Additional Exam:  Gen: Pleasant, obese, in no distress,  normal affect ENT: No lesions,  mouth clear,  oropharynx clear, no postnasal drip Neck: No JVD, no TMG, no carotid bruits Lungs: No use of accessory muscles, no dullness to percussion, decreased BS at L base and R base.   Cardiovascular: RRR, heart sounds normal, no murmur or gallops, no peripheral edema Abdomen: soft and NT, no HSM,  BS normal Musculoskeletal: No deformities, no cyanosis or clubbing Neuro: alert, non focal Skin: Warm, no lesions or rashes    CT of Chest  Procedure date:  05/16/2009  Findings:  RLL pulmonary embolism Large loculated L effusion  CT of Chest  Procedure date:  05/27/2009  Findings:      Resoluiton of RLL pulmonary embolism, presence of new R pleural effusion,  no change in L pleural empyema  Impression & Recommendations:  Problem # 1:  PLEURAL EFFUSION, RIGHT (ICD-511.9) Assessment Deteriorated This patient has a right pleural effusion, likely related to recent pulmonary embolism. the pleural effusion will have to be drained Plan Schedule thoracentesis of right pleural space utilizing Lovenox as a bridge. The patient is in need of right axillary node, biopsy so  will coordinate thoracentesis with lymph node biopsy  Medications Added to Medication List This Visit: 1)  Paxil 40 Mg Tabs (Paroxetine hcl) .... Take 1by mouth once daily 2)  Synthroid 200 Mcg Tabs (Levothyroxine sodium) .... Take one by mouth daily 3)  Vicodin ?mg  .... At bedtime 4)  Lovenox 150 Mg/ml Soln (Enoxaparin sodium) .... Inject one vial twice daily  Complete Medication List: 1)  Paxil 40 Mg Tabs (Paroxetine hcl) .... Take 1by mouth once daily 2)   Aspirin 81 Mg Tbec (Aspirin) .... Take one by mouth daily 3)  Synthroid 200 Mcg Tabs (Levothyroxine sodium) .... Take one by mouth daily 4)  Enalapril Maleate 10 Mg Tabs (Enalapril maleate) .... One by mouth daily 5)  Clobestrol Oinment  .... Apply to ankles as needed 6)  Alprazolam 0.5 Mg Tabs (Alprazolam) .Marland Kitchen.. 1 by mouth up to two times a day as needed severe anxiety 7)  Coumadin 7.5 Mg Tabs (Warfarin sodium) .... Take one tablet by mouth daily 8)  Prilosec Otc 20 Mg Tbec (Omeprazole magnesium) .... Take one tablet daily 9)  Vicodin ?mg  .... At bedtime 10)  Lovenox 150 Mg/ml Soln (Enoxaparin sodium) .... Inject one vial twice daily  Other Orders: Est. Patient Level V ([address])  Patient Instructions: 1)  Call me when you get the lymph node biopsy date scheduled 2)  Lovenox to start and coumadin stopped 5 days prior to thoracentesis and lymph node biopsy 3)  I will schedule a thoracentesis when INR is normal 5 days off coumadin 4)  See prescription for lovenox Prescriptions: LOVENOX 150 MG/ML SOLN (ENOXAPARIN SODIUM) Inject one vial twice daily Brand medically necessary #14 doses x 0   Entered and Authorized by:   Storm Frisk MD   Signed by:   Storm Frisk MD on 05/29/2009   Method used:   Print then Give to Patient   RxID:   [id number]     Appended Document: Pulmonary OV fax [facility]

## 2010-03-07 NOTE — Miscellaneous (Signed)
Summary: ED Visit Note  ED Visit Note  Reason for Visit - Patient went to [facility] for worsening shortness of breath. Onset approximately 2 weeks ago, when patient's prednisone tapered to 10 mg. Worse with movement, and exertion. Patient examined by resident physician Dr. Baltazar Apo along side Dr. Radford Pax. Physical exam positive for mild crackles heard over right lower lung base.   Labs and Imaging Studies:  CXR:  Comparison: 08/11/2009    Findings: Persistent left lower lobe pleural and parenchymal   changes without definite interval change.  Significant interval   increase in right pleural effusion with possible right lower lobe   airspace disease.    Heart predominately obscured by these bilateral densities.  No   definite heart failure.    IMPRESSION:    1.  Persistent left basilar pleural and parenchymal disease.   2.  Interval increase in right lower lobe pleural and/or   parenchymal disease.   Labs:    Sodium (NA)                              141               135-145          mEq/L  Potassium (K)                            4.5               3.5-5.1          mEq/L  Chloride                                 104               96-112           mEq/L  CO2                                      30                19-32            mEq/L  Glucose                                  133        h      70-99            mg/dL  BUN                                      9                 6-23             mg/dL  Creatinine                               .8                0.4-1.2  mg/dL  GFR, Est Non African American            >60               >60              mL/min  GFR, Est African American                >60               >60              mL/min  Calcium                                  8.6               8.4-10.5         mg/dL   Protime ( Prothrombin Time)              25.3       h      11.6-15.2        seconds INR                                       2.32       h       0.00-1.49    WBC                                      5.2               4.0-10.5         K/uL  RBC                                      4.65              3.87-5.11        MIL/uL  Hemoglobin (HGB)                         12.3              12.0-15.0        g/dL  Hematocrit (HCT)                         37.7              36.0-46.0        %  MCV                                      81.0              78.0-100.0       fL  MCH -                                    26.4              26.0-34.0  pg  MCHC                                     32.6              30.0-36.0        g/dL  RDW                                      15.8       h      11.5-15.5        %  Platelet Count (PLT)                     274               150-400          K/uL  Neutrophils, %                           70                43-77            %  Lymphocytes, %                           17                12-46            %  Monocytes, %                             8                 3-12             %  Eosinophils, %                           3                 0-5              %  Basophils, %                             3          h      0-1              %  Neutrophils, Absolute                    3.7               1.7-7.7          K/uL  Lymphocytes, Absolute                    0.9               0.7-4.0          K/uL  Monocytes, Absolute                      0.4  0.1-1.0          K/uL  Eosinophils, Absolute                    0.1               0.0-0.7          K/uL  Basophils, Absolute                      0.1               0.0-0.1          K/uL  Plan:  Shortness of breath likely secondary to worsening right sided pleural effusion. Unlikely PE as patient is adequately anticoagulated.  Patient not hypoxic, vitals are stable. Advised to keep appt with Dr. Sherene Sires as scheduled 08/27/2009 for further evaluation.

## 2010-03-07 NOTE — Progress Notes (Signed)
Summary: bystolic rx  Phone Note Call from Patient   [name] Call For: wright Summary of Call: pt had samples of bystolic 10mg  . she would like prescript called to pharmacy [facility]  [address] Initial call taken by: Rickard Patience,  October 26, 2009 2:17 PM  Follow-up for Phone Call        pt was discharged by cards.  has this been mentioned to Dr. Zenaida Niece Tright's office.  LMOM TCB. Boone Master CNA/MA  October 26, 2009 3:24 PM   Additional Follow-up for Phone Call Additional follow up Details #1::        Spoke with pt.  She is requesting rx for bystolic be called in.  She states that MW started her on this and gave her samples on 09/03/09.  Pls advise if okay to call this in or if she should get this through PCP.  Thanks Additional Follow-up by: Vernie Murders,  October 29, 2009 10:34 AM    Additional Follow-up for Phone Call Additional follow up Details #2::    ok to call in  1 months supply and 6 refills Follow-up by: Storm Frisk MD,  October 29, 2009 1:22 PM  Additional Follow-up for Phone Call Additional follow up Details #3:: Details for Additional Follow-up Action Taken[facility] Additional Follow-up by: Vernie Murders,  October 29, 2009 1:38 PM  Prescriptions: BYSTOLIC 10 MG  TABS (NEBIVOLOL HCL) One tablet daily  #30 x 6   Entered by:   Vernie Murders   Authorized by:   Storm Frisk MD   Signed by:   Vernie Murders on 10/29/2009   Method used:   Electronically to        [facility]  [address] # [id number]* (retail)       [address]       [place], [place]  [address]       Ph: [phone] or [phone]       Fax: [phone]   RxID:   [id number]

## 2010-03-07 NOTE — Letter (Signed)
Summary: Generic Letter  [place] [facility]  [address] [place], [place] [address]   Phone: [phone]  Fax: [phone]    06/18/2009  VONITA CALLOWAY [address] [place], [place]  [address]  Dear Ms. Duch,   You will need to be off coumadin for your upcoming biopsy 07/16/2009, 6/14, 6/15/, 6/16, -- (biopsy is 6/17) .  You can resume coumadin the eve after biopsy depending on plan with Dr Delford Field for your next proceedure.  please call me the week before so I can px you lovenox to bridge the gap with.  Please call if any questions.     Sincerely,   Roxy Manns MD

## 2010-03-07 NOTE — Letter (Signed)
Summary: Stacey Drain MD  Stacey Drain MD   Imported By: Sherian Rein 08/27/2009 10:41:36  _____________________________________________________________________  External Attachment:    Type:   Image     Comment:   External Document

## 2010-03-07 NOTE — Assessment & Plan Note (Signed)
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 Bili:  Alk Phos:    diet has been better than usual  wt is down 4 lbs (even on prednisone)    AIC last 5.7   thyroid -- feels ok - no clinical changes   Allergies: 1)  ! 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 (04/1998) Superficial thrombus, right leg  (01/2000) Hosp- pericardial effusion (06/2003) UTI- hematuria work up (04/2004) CT pelvis- neg. 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 Eyes:  Denies blurring and eye pain. CV:  Denies chest pain or discomfort, lightheadness, and palpitations. Resp:  Denies cough, shortness of breath, and wheezing. GI:  Denies abdominal pain, bloody stools, and change in bowel habits. GU:  Denies dysuria and hematuria. MS:  Complains of joint pain; denies muscle aches and cramps. Derm:  Denies itching, lesion(s), poor wound healing, and rash. Neuro:  Denies numbness and tingling. Psych:  Complains of anxiety; mood is overall ok -- her anxiety is improved. Endo:  Denies cold intolerance, excessive thirst, excessive urination, and heat intolerance.  Physical Exam  General:  morbidly obese- but well appearing  Head:  normocephalic, atraumatic, and no abnormalities observed.   Eyes:  vision grossly intact, pupils equal, pupils round, and pupils reactive to light.   Mouth:  pharynx pink and moist.  Neck:  supple with full rom and no masses or thyromegally, no JVD or carotid bruit  Chest Wall:  No deformities, masses, or tenderness noted. Lungs:  Normal respiratory effort, chest expands symmetrically. Lungs are clear to auscultation, no crackles or wheezes. Heart:  Normal rate and regular rhythm. S1 and S2 normal without gallop, murmur, click, rub or other extra sounds. Abdomen:  Bowel sounds positive,abdomen soft and non-tender without masses, organomegaly or hernias noted. Msk:  No deformity or  scoliosis noted of thoracic or lumbar spine.   Pulses:  R and L carotid,radial,femoral,dorsalis pedis and posterior tibial pulses are full and equal bilaterally Extremities:  baseline edema in legs with hyperemia is  improved today Neurologic:  sensation intact to light touch, gait normal, and DTRs symmetrical and normal.  no tremor  Skin:  Intact without suspicious lesions or rashes Cervical Nodes:  No lymphadenopathy noted Psych:  normal affect, talkative and pleasant    Impression & Recommendations:  Problem # 1:  PLEURAL EFFUSION, RIGHT (ICD-511.9) Assessment Deteriorated for thoracentesis on june 14 rev protocol for stopping coumadin and starting lovenox - for this and axillary bx on the 17th  pt voiced understanding is asymptomatic on prednisone   Problem # 2:  SCREENING FOR DIABETES MELLITUS (ICD-V77.1) last AIC ok  sugar will be up this check due to prednisone  Problem # 3:  HYPERTENSION, ESSENTIAL NOS (ICD-401.9) Assessment: Unchanged good control no change in ace  continue working on diet and exercise  Her updated medication list for this problem includes:    Enalapril Maleate 10 Mg Tabs (Enalapril maleate) ..... One by mouth daily  Orders: Venipuncture (84132) TLB-Lipid Panel (80061-LIPID) TLB-Renal Function Panel (80069-RENAL) TLB-TSH (Thyroid Stimulating Hormone) (84443-TSH) TLB-ALT (SGPT) (84460-ALT) TLB-AST (SGOT) (84450-SGOT)  Problem # 4:  Hx of HYPOTHYROIDISM, POSTABLATION (ICD-244.8) no clinical change - check tsh  Her updated medication list for this problem includes:    Synthroid 200 Mcg Tabs (Levothyroxine sodium) .Marland Kitchen... Take one by mouth daily  Orders: Venipuncture (44010) TLB-Lipid Panel (80061-LIPID) TLB-Renal Function Panel (80069-RENAL) TLB-TSH (Thyroid Stimulating Hormone) (84443-TSH) TLB-ALT (SGPT) (84460-ALT) TLB-AST (SGOT) (84450-SGOT)  Complete Medication List: 1)  Paxil 40 Mg Tabs (Paroxetine hcl) .... Take 1by mouth once daily 2)   Aspirin 81 Mg Tbec (Aspirin) .... Take one by mouth daily 3)  Synthroid 200 Mcg Tabs (Levothyroxine sodium) .... Take one by mouth daily 4)  Enalapril Maleate 10 Mg Tabs (Enalapril maleate) .... One by mouth daily 5)  Clobestrol Oinment  .... Apply to ankles as needed 6)  Alprazolam 0.5 Mg Tabs (Alprazolam) .Marland Kitchen.. 1 by mouth up to two times a day as needed severe anxiety 7)  Coumadin 7.5 Mg Tabs (Warfarin sodium) .... Take one tablet by mouth daily 8)  Prilosec Otc 20 Mg Tbec (Omeprazole magnesium) .... Take one tablet daily 9)  Vicodin ?mg  .... At bedtime 10)  Prednisone 20 Mg Tabs (Prednisone) .... Taking 50mg  daily 11)  Plaquenil 200 Mg Tabs (Hydroxychloroquine sulfate) .... Take 1 tablet by mouth once a day 12)  Lovenox 150 Mg/ml Soln (Enoxaparin sodium) .... Inject two times a day as directed  Patient Instructions: 1)  labs today 2)  keep working on diet and exercise as tolerated  3)  stop your coumadin on june 9th 4)  then start lovenox two times a day - skip the day of proceedure 5)  schedule protime monday the 13th and again on june 20th  6)  start coumadin as soon as Careers adviser says it is  ok  Prescriptions: LOVENOX 150 MG/ML SOLN (ENOXAPARIN SODIUM) inject two times a day as directed  #20 doses x 0   Entered and Authorized by:   [name] MD   Signed by:   [name] MD on 07/09/2009   Method used:   Electronically to        [facility]. # [id number]* (retail)       [address]       [place], [place]  [address]       Ph: [phone] or [phone]       Fax: [phone]   RxID:   [id number]   Current Allergies (reviewed today): ! AVELOX AMOXICILLIN LIPITOR

## 2010-03-07 NOTE — Progress Notes (Signed)
Summary: increased SOB  Phone Note Call from Patient Call back at Home Phone [phone]   [name] Call For: wright Summary of Call: Pt states she's been experiencing sob this week since her prednisone was decreased to 10mg , wants to know if she should increase it, pls advise. Initial call taken by: Darletta Moll,  November 15, 2009 2:23 PM  Follow-up for Phone Call        Spoke with pt.  She states that this is the second wk that she has decreased her prednisone to 10 mg- (this is per last ov instructions on 9/19).  She states that since the decrease in med she has had more SOB with exertion- denies wheeze or cough but c/o frequent throat clearing. Will forward to doc of the day to address. Pls advise thanks allergied are avelox and amoxcicillin Follow-up by: Vernie Murders,  November 15, 2009 2:30 PM  Additional Follow-up for Phone Call Additional follow up Details #1::        increase to 20mg  /d prednisone needs ov with Cxr with TP in am  Additional Follow-up by: Storm Frisk MD,  November 15, 2009 3:27 PM    Additional Follow-up for Phone Call Additional follow up Details #2::    Spoke with pt and notified of recs per PW.  Pt verbalized understanding.  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  Is it okay for her to wait until monday 10/17 for appt? Pls advise thanks Follow-up by: Vernie Murders,  November 15, 2009 3:38 PM  Additional Follow-up for Phone Call Additional follow up Details #3:: Details for Additional Follow-up Action Taken: yes  will need CXR first  Spoke with pt and sched appt with TP for 11/19/09 at 121 am- advised that she needs to get here approx 15 min prior to this so that she can go down for cxr.  Pt verbalized understanding. Vernie Murders  November 15, 2009 4:06 PM Additional Follow-up by: Storm Frisk MD,  November 15, 2009 4:00 PM

## 2010-03-07 NOTE — Progress Notes (Signed)
Summary: pt is asking for biopsy results  Phone Note Call from Patient Call back at Home Phone [phone]   [name] Call For: Cheryl Part MD Summary of Call: Pt is asking for biopsy results, please advise. Initial call taken by: Lowella Petties CMA,  July 31, 2009 10:03 AM  Follow-up for Phone Call        please let pt know that bx report reads lymphoproliferative process seen -- but no signs of malignancy/ lymphoma (good news)  please send copy (in my out box ) to Dr Dwain Sarna gen surgery to get his input as well Follow-up by: Cheryl Part MD,  July 31, 2009 11:24 AM  Additional Follow-up for Phone Call Additional follow up Details #1::        Left message for patient to call back.  spoke with Elease Hashimoto in Dr Doreen Salvage office. Dr Dwain Sarna is out of office until 08/14/09. Dr Milinda Antis said that was OK. she just wanted Dr Dwain Sarna to review path reports and let Dr Milinda Antis know Dr Doreen Salvage thoughts on how to proceed. Cover sheet requesting Dr Doreen Salvage opinion of path reports  and pathology reports faxed to [phone] Alisha's attention.Will await to hear from Dr. Germain Osgood upon his return.  Reports put in folder at [facility] desk for scanning.Lewanda Rife LPN  July 31, 2009 1:04 PM     Additional Follow-up for Phone Call Additional follow up Details #2::    Patient notified as instructed by telephone. Pt would like to know Dr Doreen Salvage response also, so pt will call back on 08/17/09 if she has not heard from our office.Lewanda Rife LPN  August 01, 2009 8:20 AM

## 2010-03-07 NOTE — Letter (Signed)
Summary: Out of Work  [employer]  [address]   [place], [place] [address]   Phone: [phone]  Fax: [phone]    August 31, 2009   Employee:  [name]    To Whom It May Concern:   For Medical reasons, please excuse the above named employee from work for the following dates:  Start:   08-31-2009  End:   09-03-2009  If you need additional information, please feel free to contact our office.         Sincerely,      Tammy Parrett,NP

## 2010-03-07 NOTE — Progress Notes (Signed)
Summary: Note to return to work  Phone Note Call from Patient   Summary of Call: needs return to work note for tomorrow - may 10th   Follow-up for Phone Call        will write note  Follow-up by: [name] MD,  Jun 11, 2009 3:06 PM  Additional Follow-up for Phone Call Additional follow up Details #1::        Pt home # is full and not  room for v/m. Pt had left this fax # [phone] for the note to be faxed to. Aram Beecham said it would be OK to fax. I faxed note to [phone].Lewanda Rife LPN  Jun 11, 4780 4:05 PM

## 2010-03-07 NOTE — Assessment & Plan Note (Signed)
Summary: Pulmonary OV   Primary Provider/Referring Provider:  [name] MD  CC:  Follow-up visit. Pt states breathing changes daily. c/o intermittent productive cough, chest tightness, and fatigue. Denies wheezing. Currently taking abx for dental work, and had Iron infusion x 2 .  History of Present Illness: 45 yowf last smoked around 06/2009  with chronic  L empyema s/p VATS 2010.  Still with persistent disease L chest , not a candidate for decortication per TCTS Dr Donata Clay   Pt admitted 3/4-04/08/08.  Had sudden onset of feeling hot and flushed.  ? CVA ruled out but in ED found abn CXR ?PNA.  f/u ct chest showed loculated L pleural effusion.  Had been previously evaluated for same in past.  Tap showed dark brown fluid with negative pleural fluid cytology.  Had no positive micro data.  Dx lupus with nephritis and joint involvement.  Had serositis with pericardial and pleural effusion in the past.   Then was followed expectantly.   Dr Kellie Simmering is rheum MD.    May 29, 2009  This patient has not been seen since June 2010. Patient did well until 6 weeks ago, when she became dizzy and went to work. She went to the emergency room an IV was placed in the right arm. Cardiac workup was negative and she was sent home. She then devleoped  right arm pain and swelling.  She was dx with   superficial thrombophlebitis. She was treated with tramadol only.  She did not improve and returned on 16 May 2008 to the ED with  chest pain in the front and back. A CT scan of the chest revealed  pulmonary embolism. Right lower lobe.   rx with Lovenox and Coumadin bridging. Over time she improved, but then on 24 April,  she returned emergently for pleuritic cp >  Chest CT scan was repeated and showed resolution of right lower lobe pulmonary embolism. She was however, present with right pleural effusion. She is still having ongoing dyspnea and chest pain. She has a dry cough. There is no fever, chills, or sweats. She is  here now for further evaluation on referral from the emergency room.  PAGE 2 >>>>>>>>>>>>>>>>>>.  August 15, 2009  to ED and had more dyspnea and pressure.  CXR no real change.  The pt still with  L loculated effusion.   The prednisone was down to 10mg  /d and this is when this occured.Not as much tightness as before.  If walks anywhere is out of breath.  Still on coumadin,  cont lovenox and coumadinAxillary LN was biopsied and was neg for lymphoma rec stay on same rx but stopped prednisone when rx ran out.    August 27, 2009 cc  since finished prednisone x 2 weeks worse doe room to room and now can't lie back to sleep on one pillow any more x 1 week with bilateral chest discomfort during deep breathing, minimal dry cough. --tx w/ steroid burst and d/c ace  August 31, 2009--Returns for persistent symptoms. Was seen 4 days ago, tx w/ steroid burst. Pt is very complicated. Since last 4 months has worsening of dyspnea. Initially dx w/ PE in 4/11 started on coumadin/lovenox bridge. CT showed smll right effusion and loculated left effusion. Over last 2 weeks continued w/ increased dsypnea. Tx w/ steroids. Xray showed locuated left effusion and increasing right effusion. She increased prednisone up to 40mg  , now on 30mg  -first day./ W. only minimal improvment. Still has DOE, and  now increased cough w/ creamy mucus.   September 03, 2009 cough and sob flat much better, still doe x > 100 ft which is a chronic complaint.  n         September 14, 2009 3:24 PM Still cannot breathe, if try to do anything is out of breath, has to lean forward. Is not coughing.  No mucus.  No real chest pain  LN bx was unrevealing Pt wiht recurrent R pleural effusion, when tapers prednisone effusion worsens.  Hx lupus   September 20, 2009 2:26 PM Pt underwent thoracentesis this week and then developed pain 1.5 L removed.  Mon was procedure.  Tue ok Wed felt pain on L side.  continue to worsen.  Itches on L side,  progressed throughout the  day.  If breathe in is sharp pain R side is not the area of pain the pt is back on the coumadin  INR 1.0 but still on therapeutic lovenox as a bridge. No real cough.  Dyspnea is better.   No fever or chills or sweats.  October 22, 2009 2:47 PM Pt in hospital for VATS of R pleural effusion.  Had lovenox bridging. 8/29- 10/09/09 per Dr Donata Clay. Still has pleuryx catheter.  There is minimal pleural fluid output so far.  The pt goes to the office weekly for TCTS recheck Dyspnea is better.   Pleuryx ? to stay until end of october. From Echart all pleural fluid c/s neg.  from vats.   INR 2.2   past week Pt was on keflex due to chest tube insertion site was infected. Pt also had candida in throat and rx diflucan and just finished this 9/19   11/19/09--Presents for an acute office visit. Complains of worsening SOB for 1 week (Decreased Prednisone to 10 mg for 1 week then SOB started again) - Clears throat alot - Mild wheezing - Denies cough - Increased Pred back to 20 mg /day on 11-15-09.  Last ov rec to taper steroids. When she got to 10mg  of prednisone noticed breathing not as good. Has ov with CVTS this afternoon. Today CXR w/ no increase in effusion. .  Last INR  was 2.8 10/26/09. She denies chest pain, calf pain. Has has some increased fluid retention esp in legs. Weight is up  7 lbs.  She deneis increased cough, congestion, fever. Denies chest pain,  orthopnea, hemoptysis, fever, n/v/d,  headache.   November 22, 2009 12:21 PM  Had developed more dyspnea and ? if fluid was building up.  Edema is better. weight is down 7 # Saw Truslow.  ? what to do prednisone.   December 20, 2009 12:08 PM Now on 17mg /d pred.  Dyspnea comes and goes.  Feels bloated at times , and did cough up mucus.  Mucus is gray material.  Occ sharp stabbing pain and is bilateral,  still gets pain up back into the neck and out through shoulders pulsating pain. Joints are an issue, esp R knee developed a tooth abscess R upper  tooth and now on cleocin.  Had to have iron infusions twice for chronic anemia  Preventive Screening-Counseling & Management  Alcohol-Tobacco     Smoking Status: quit     Packs/Day: 1-2 cigs a day     Year Started: 1986     Year Quit: 2002  Current Medications (verified): 1)  Lyrica 75 Mg Caps (Pregabalin) .... Take 1 Capsule By Mouth Once A Day 2)  Paxil 40 Mg Tabs (Paroxetine  Hcl) .... Take 1by Mouth Once Daily 3)  Aspirin 81 Mg Tbec (Aspirin) .... Take One By Mouth Daily 4)  Synthroid 200 Mcg  Tabs (Levothyroxine Sodium) .... Take One By Mouth Daily 5)  Synthroid 25 Mcg Tabs (Levothyroxine Sodium) .Marland Kitchen.. 1 Every Am Before Breakfast 6)  Coumadin 7.5 Mg Tabs (Warfarin Sodium) .... As Directed 7)  Prilosec 20 Mg Cpdr (Omeprazole) .Marland Kitchen.. 1 By Mouth Once Daily in Ams 8)  Plaquenil 200 Mg Tabs (Hydroxychloroquine Sulfate) .... Take 3 Tablet By Mouth Once A Day 9)  Prednisone 10 Mg  Tabs (Prednisone) .... Take As Directed 10)  Bystolic 10 Mg  Tabs (Nebivolol Hcl) .... One Tablet Daily 11)  Lasix 20 Mg Tabs (Furosemide) .... Take 1 Tablet By Mouth Once A Day 12)  Percocet 5-325 Mg Tabs (Oxycodone-Acetaminophen) .Marland Kitchen.. 1-2 Every 4 Hours As Needed 13)  Clobetasol Propionate 0.05 % Oint (Clobetasol Propionate) .... As Needed On Ankles 14)  Alprazolam 0.5 Mg Tabs (Alprazolam) .Marland Kitchen.. 1 By Mouth Up To Two Times A Day As Needed Severe Anxiety 15)  Prednisone 1 Mg Tabs (Prednisone) .... Take As Directed in Combination With 10 Mg Prednisone 16)  Clindamycin Hcl 150 Mg Caps (Clindamycin Hcl) .... Take 1 Tablet By Mouth Every 6 Hours  Allergies (verified): 1)  ! Avelox 2)  Amoxicillin 3)  Lipitor  Past History:  Past medical, surgical, family and social histories (including risk factors) reviewed, and no changes noted (except as noted below).  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)   pleural effusions 2005 c/w lupus initial w/u Loculated empyema chronic on L.................................................Marland KitchenVanTright      -s/p VATS 06/2008  recurrent R effusion as pred tapered off July 2011     -Pleuryx catheter placement 9/11 VanTrigt...Marland KitchenMarland KitchenMarland Kitchenremoved  9/11 hyperlipidemia obesitly  glomerulonephritis  hypothyroid anemia  iron deficiency    -IV dextran [place]  renal - [facility] rheum - [facility] pulm- [facility] thoracic surgeon- DR VanTright heme   Past Surgical History: Reviewed history from 04/25/2008 and no changes required. Stress echo- neg. (04/1998) Superficial thrombus, right leg (01/2000) Hosp- pericardial effusion (06/2003) UTI- hematuria work up (04/2004) CT pelvis- neg. (05/2004) Pleurocentesis (10/2004) thoracentesis 2010 with pneumonia  Family History: Reviewed history from 04/06/2008 and no changes required. Sister with lupus GM with DM  Social History: Reviewed history from 10/22/2009 and no changes required. 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  socially x 10 years ( 1 pack/month)  Last cigerette Spring 2011  Review of Systems       The patient complains of shortness of breath with activity, productive cough, non-productive cough, and tooth/dental problems.  The patient denies shortness of breath at rest, coughing up blood, chest pain, irregular heartbeats, acid heartburn, indigestion, loss of appetite, weight change, abdominal pain, difficulty swallowing, sore throat, headaches, nasal congestion/difficulty breathing through nose, sneezing, itching, ear ache, anxiety, depression, hand/feet swelling, joint stiffness or pain, rash, change in color of mucus, and fever.    Vital Signs:  Patient profile:   45 year old female Height:      65 inches Weight:      358.04 pounds BMI:     59.80 O2 Sat:      97 % on Room air Temp:     97.8 degrees F oral Pulse rate:   83 / minute BP  sitting:  120 / 60  (left arm) Cuff size:   large  Vitals Entered By: Zackery Barefoot CMA (December 20, 2009 11:55 AM)  O2 Flow:  Room air CC: Follow-up visit. Pt states breathing changes daily. c/o intermittent productive cough, chest tightness, and fatigue. Denies wheezing. Currently taking abx for dental work, had Iron infusion x 2  Comments Medications reviewed with patient Verified contact number and pharmacy with patient Zackery Barefoot CMA  December 20, 2009 11:55 AM    Physical Exam  Additional Exam:  Gen: Pleasant, obese, in no distress,  normal affect  ENT: No lesions,  mouth clear,  oropharynx clear, no postnasal drip Neck: No JVD, no TMG, no carotid bruits Lungs: No use of accessory muscles,  no change in decreased BS in LLL, open wound from prev chest tube healed . , no redness.  Cardiovascular: RRR, heart sounds normal, no murmur or gallops, venous insuff. changes.  Abdomen: soft and NT, no HSM,  BS normal Musculoskeletal: No deformities, no cyanosis or clubbing Neuro: alert, non focal Skin: Warm, scattered skin irritation, papules along arm.     Impression & Recommendations:  Problem # 1:  PLEURAL EFFUSION (ICD-511.9) Assessment Improved resolved effusion on R, chronic empyema on L, pleuritis due to lupus plan taper prednisone further   Orders: Est. Patient Level IV (29562)  Medications Added to Medication List This Visit: 1)  Lasix 20 Mg Tabs (Furosemide) .... Take 1 tablet by mouth once a day 2)  Clobetasol Propionate 0.05 % Oint (Clobetasol propionate) .... As needed on ankles 3)  Clindamycin Hcl 150 Mg Caps (Clindamycin hcl) .... Take 1 tablet by mouth every 6 hours  Complete Medication List: 1)  Lyrica 75 Mg Caps (Pregabalin) .... Take 1 capsule by mouth once a day 2)  Paxil 40 Mg Tabs (Paroxetine hcl) .... Take 1by mouth once daily 3)  Aspirin 81 Mg Tbec (Aspirin) .... Take one by mouth daily 4)  Synthroid 200 Mcg Tabs (Levothyroxine sodium)  .... Take one by mouth daily 5)  Synthroid 25 Mcg Tabs (Levothyroxine sodium) .Marland Kitchen.. 1 every am before breakfast 6)  Coumadin 7.5 Mg Tabs (Warfarin sodium) .... As directed 7)  Prilosec 20 Mg Cpdr (Omeprazole) .Marland Kitchen.. 1 by mouth once daily in ams 8)  Plaquenil 200 Mg Tabs (Hydroxychloroquine sulfate) .... Take 3 tablet by mouth once a day 9)  Prednisone 10 Mg Tabs (Prednisone) .... Take as directed 10)  Bystolic 10 Mg Tabs (Nebivolol hcl) .... One tablet daily 11)  Lasix 20 Mg Tabs (Furosemide) .... Take 1 tablet by mouth once a day 12)  Percocet 5-325 Mg Tabs (Oxycodone-acetaminophen) .Marland Kitchen.. 1-2 every 4 hours as needed 13)  Clobetasol Propionate 0.05 % Oint (Clobetasol propionate) .... As needed on ankles 14)  Alprazolam 0.5 Mg Tabs (Alprazolam) .Marland Kitchen.. 1 by mouth up to two times a day as needed severe anxiety 15)  Prednisone 1 Mg Tabs (Prednisone) .... Take as directed in combination with 10 mg prednisone 16)  Clindamycin Hcl 150 Mg Caps (Clindamycin hcl) .... Take 1 tablet by mouth every 6 hours  Patient Instructions: 1)  Continue to taper prednisone down by 1mg  every week until you get to 10mg  a day and stay 2)  No other medication changes 3)  Follow up with Dr Kellie Simmering to consider azathioprine, wait until tooth abscess is better 4)  Return [place] 6 weeks     Appended Document: Pulmonary OV fax william truslow

## 2010-03-07 NOTE — Assessment & Plan Note (Signed)
Summary: Pulmonary OV   Primary Provider/Referring Provider:  Colon Flattery Tower MD  CC:  SOB with activity and some SOB without activity, x1 week, pt was in the ER on saturday for the sob and pt states it was pressure on her chest, and pt states after her prednisone was decreased is when she started having the SOB.  History of Present Illness: Pulmonary OV        This is a 45  year old female with chronic L pleural empyema s/p VATS 2010.  Still with persistent disease L chest , not a candidate for decortication per TCTS Dr Donata Clay   Pt admitted 3/4-04/08/08.  Had sudden onset of feeling hot and flushed.  ? CVA ruled out but in ED found abn CXR ?PNA.  f/u ct chest showed loculated L pleural effusion.  Had been previously evaluated for same in past.  Tap showed dark brown fluid with negative pleural fluid cytology.  Had no positive micro data.    Dx lupus with nephritis and joint involvement.  Had serositis with pericardial and pleural effusion in the past.   Then was followed expectantly.   Dr Kellie Simmering is rheum MD. On no systemic rx at this time.  July 20, 2008 12:03 PM Pt underwent VATS for loculated empyema.  Pt has fluid removed.  C/S and cytology neg. Cavity has refilled.  Pt would need a decortication but has declined to proceed per surgery due to severe comorbidity due to weight issues. Pt denies any significant sore throat, nasal congestion or excess secretions, fever, chills, sweats, unintended weight loss, pleurtic or exertional chest pain, orthopnea PND, or leg swelling Pt denies any increase in rescue therapy over baseline, denies waking up needing it or having any early am or nocturnal exacerbations of coughing/wheezing/or dyspnea. May 29, 2009 9:56 AM This patient has not been seen since June 2010. Patient did well until 6 weeks ago, when she became dizzy and went to work. She went to the emergency room an IV was placed in the right arm. Cardiac workup was negative and she was sent  home. She then devleoped  right arm pain and swelling.  She was dx with   superficial thrombophlebitis. She was treated with tramadol only. l. She did not improve and returned on 16 May 2008 to the ED with  chest pain in the front and back. A CT scan of the chest reveal pulmonary embolism. Right lower lobe.   She was  rx with Lovenox and Coumadin bridging. Over time she improved, but then on 24 April, she returned emergently for chest pain. She  had pleuritic-type syndrome complex. Chest CT scan was repeated and showed resolution of right lower lobe pulmonary embolism. She was however, present with right pleural effusion. She is still having ongoing dyspnea and chest pain. She has a dry cough. There is no fever, chills, or sweats. She is here now for further evaluation on referral from the emergency room.   this pt also has a R axillary LN which is in need of Biopsy per surgery   August 15, 2009 3:49 PM The pt went to ED and had more dyspnea and pressure.  CXR no real change.  The pt still wiht  L loculated effusion.   The prednisone was down to 10mg  /d and this is when this occured. Not as much tightness as before.  If walks anywhere is out of breath.  Still on coumadin,  cont lovenox and coumadin  LN was biopsied  and was neg for lymphoma no cough   Preventive Screening-Counseling & Management  Alcohol-Tobacco     Smoking Status: quit     Packs/Day: 1-2 cigs a day     Year Started: 1986     Year Quit: 2002  Current Medications (verified): 1)  Paxil 40 Mg Tabs (Paroxetine Hcl) .... Take 1by Mouth Once Daily 2)  Aspirin 81 Mg Tbec (Aspirin) .... Take One By Mouth Daily 3)  Synthroid 200 Mcg  Tabs (Levothyroxine Sodium) .... Take One By Mouth Daily 4)  Enalapril Maleate 10 Mg Tabs (Enalapril Maleate) .... One By Mouth Daily 5)  Clobestrol Oinment .... Apply To Ankles As Needed 6)  Alprazolam 0.5 Mg Tabs (Alprazolam) .Marland Kitchen.. 1 By Mouth Up To Two Times A Day As Needed Severe Anxiety 7)   Coumadin 7.5 Mg Tabs (Warfarin Sodium) .... Take One Tablet By Mouth Daily 8)  Prilosec 20 Mg Cpdr (Omeprazole) .Marland Kitchen.. 1 By Mouth Once Daily in Ams 9)  Vicodin ?mg .... At Bedtime As Needed 10)  Prednisone 20 Mg Tabs (Prednisone) .... Taking 1/2 Tablet Daily 11)  Plaquenil 200 Mg Tabs (Hydroxychloroquine Sulfate) .... Take 1 Tablet By Mouth Once A Day 12)  Lovenox 150 Mg/ml Soln (Enoxaparin Sodium) .... Inject Two Times A Day As Directed 13)  Crestor 10 Mg Tabs (Rosuvastatin Calcium) .Marland Kitchen.. 1 By Mouth Once Daily  Allergies (verified): 1)  ! Avelox 2)  Amoxicillin 3)  Lipitor  Past History:  Past medical, surgical, family and social histories (including risk factors) reviewed, and no changes noted (except as noted below).  Past Medical History: Reviewed history from 12/25/2008 and no changes required. Anxiety DVT, hx of- during pregnancy 2002 - ?factor 5 leiden (sees heme) HTN SLE (renal GN, hx of pericardial eff), then pleural eff  Loculated empyema chronic     -s/p VATS 5/10 hyperlipidemia obesitly  glomerulonephritis  hypothyroid  renal - [place] [facility] - [facility]- [facility]- DR VanTright heme   Past Surgical History: Reviewed history from 04/25/2008 and no changes required. Stress echo- neg. (04/1998) Superficial thrombus, right leg (01/2000) Hosp- pericardial effusion (06/2003) UTI- hematuria work up (04/2004) CT pelvis- neg. (05/2004) Pleurocentesis (10/2004) thoracentesis 2010 with pneumonia  Family History: Reviewed history from 04/06/2008 and no changes required. Sister with lupus GM with DM  Social History: Reviewed history from 04/06/2008 and no changes required. 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  The patient denies shortness of breath  at rest, productive cough, non-productive cough, coughing up blood, chest pain, irregular heartbeats, acid heartburn, indigestion, loss of appetite, weight change, abdominal pain, difficulty swallowing, sore throat, tooth/dental problems, headaches, nasal congestion/difficulty breathing through nose, sneezing, itching, ear ache, anxiety, depression, hand/feet swelling, joint stiffness or pain, rash, change in color of mucus, and fever.    Vital Signs:  Patient profile:   45 year old female Height:      65 inches Weight:      337.4 pounds BMI:     56.35 O2 Sat:      96 % on Room air Temp:     98.3 degrees F oral Pulse rate:   117 / minute BP sitting:   140 / 60  (left arm) Cuff size:   large  Vitals Entered By: Carver Fila (August 15, 2009  3:38 PM)  O2 Flow:  Room air CC: SOB with activity and some SOB without activity, x1 week,  pt was in the ER on saturday for the sob and pt states it was pressure on her chest, pt states after her prednisone was decreased is when she started having the SOB Is Patient Diabetic? No Comments meds and allergies updated phone number updated Carver Fila  August 15, 2009 3:38 PM    Physical Exam  Additional Exam:  Gen: Pleasant, obese, in no distress,  normal affect ENT: No lesions,  mouth clear,  oropharynx clear, no postnasal drip Neck: No JVD, no TMG, no carotid bruits Lungs: No use of accessory muscles, no dullness to percussion, decreased BS at L base Cardiovascular: RRR, heart sounds normal, no murmur or gallops, no peripheral edema Abdomen: soft and NT, no HSM,  BS normal Musculoskeletal: No deformities, no cyanosis or clubbing Neuro: alert, non focal Skin: Warm, no lesions or rashes    Impression & Recommendations:  Problem # 1:  PLEURAL EFFUSION, RIGHT (ICD-511.9) Assessment Improved this has resolved on prior u/s plan no further w/u  Problem # 2:  PULMONARY EMBOLISM (ICD-415.19) Assessment: Improved no evidence for recurrent  PE plan cont coumadin Her updated medication list for this problem includes:    Aspirin 81 Mg Tbec (Aspirin) .Marland Kitchen... Take one by mouth daily    Coumadin 7.5 Mg Tabs (Warfarin sodium) .Marland Kitchen... Take one tablet by mouth daily    Lovenox 150 Mg/ml Soln (Enoxaparin sodium) ..... Inject two times a day as directed  Orders: Est. Patient Level III (16109)  Problem # 3:  EMPYEMA (ICD-510.9) Assessment: Unchanged  Loculated pleural effusion/chronic empyema.  plan cont observation  Orders: Est. Patient Level III (60454)  Complete Medication List: 1)  Paxil 40 Mg Tabs (Paroxetine hcl) .... Take 1by mouth once daily 2)  Aspirin 81 Mg Tbec (Aspirin) .... Take one by mouth daily 3)  Synthroid 200 Mcg Tabs (Levothyroxine sodium) .... Take one by mouth daily 4)  Enalapril Maleate 10 Mg Tabs (Enalapril maleate) .... One by mouth daily 5)  Clobestrol Oinment  .... Apply to ankles as needed 6)  Alprazolam 0.5 Mg Tabs (Alprazolam) .Marland Kitchen.. 1 by mouth up to two times a day as needed severe anxiety 7)  Coumadin 7.5 Mg Tabs (Warfarin sodium) .... Take one tablet by mouth daily 8)  Prilosec 20 Mg Cpdr (Omeprazole) .Marland Kitchen.. 1 by mouth once daily in ams 9)  Vicodin ?mg  .... At bedtime as needed 10)  Prednisone 20 Mg Tabs (Prednisone) .... Taking 1/2 tablet daily 11)  Plaquenil 200 Mg Tabs (Hydroxychloroquine sulfate) .... Take 1 tablet by mouth once a day 12)  Lovenox 150 Mg/ml Soln (Enoxaparin sodium) .... Inject two times a day as directed 13)  Crestor 10 Mg Tabs (Rosuvastatin calcium) .Marland Kitchen.. 1 by mouth once daily  Patient Instructions: 1)  No change in medications 2)  Return in    4      months

## 2010-03-07 NOTE — Progress Notes (Signed)
Summary: Cytopathology and Surgical pathology reports   Phone Note Other Incoming   Caller: Cytopathology and Surgical Pathology reports Summary of Call: Cytopathology and Surgical Pathology reports on shelf in the in box. Initial call taken by: Lewanda Rife LPN,  July 31, 2009 9:16 AM  Follow-up for Phone Call        this was signed to scan- thanks  Follow-up by: [name] MD,  July 31, 2009 9:31 AM

## 2010-03-07 NOTE — Medication Information (Signed)
Summary: Tax adviser   Imported By: Valinda Hoar 09/27/2009 10:46:41  _____________________________________________________________________  External Attachment:    Type:   Image     Comment:   External Document

## 2010-03-07 NOTE — Assessment & Plan Note (Signed)
Summary: Pulmonary OV   Primary Provider/Referring Provider:  Colon Flattery Tower MD  CC:  occasional sob with exertion pt says not like it was" no sob no wheezing and no coughing.  History of Present Illness: 45 yowf last smoked around 06/2009  with chronic  L empyema s/p VATS 2010.  Still with persistent disease L chest , not a candidate for decortication per TCTS Dr Donata Clay   Pt admitted 3/4-04/08/08.  Had sudden onset of feeling hot and flushed.  ? CVA ruled out but in ED found abn CXR ?PNA.  f/u ct chest showed loculated L pleural effusion.  Had been previously evaluated for same in past.  Tap showed dark brown fluid with negative pleural fluid cytology.  Had no positive micro data.  Dx lupus with nephritis and joint involvement.  Had serositis with pericardial and pleural effusion in the past.   Then was followed expectantly.   Dr Kellie Simmering is rheum MD.    May 29, 2009  This patient has not been seen since June 2010. Patient did well until 6 weeks ago, when she became dizzy and went to work. She went to the emergency room an IV was placed in the right arm. Cardiac workup was negative and she was sent home. She then devleoped  right arm pain and swelling.  She was dx with   superficial thrombophlebitis. She was treated with tramadol only.  She did not improve and returned on 16 May 2008 to the ED with  chest pain in the front and back. A CT scan of the chest revealed  pulmonary embolism. Right lower lobe.   rx with Lovenox and Coumadin bridging. Over time she improved, but then on 24 April,  she returned emergently for pleuritic cp >  Chest CT scan was repeated and showed resolution of right lower lobe pulmonary embolism. She was however, present with right pleural effusion. She is still having ongoing dyspnea and chest pain. She has a dry cough. There is no fever, chills, or sweats. She is here now for further evaluation on referral from the emergency room.  PAGE 2 >>>>>>>>>>>>>>>>>>.  August 15, 2009  to ED and had more dyspnea and pressure.  CXR no real change.  The pt still with  L loculated effusion.   The prednisone was down to 10mg  /d and this is when this occured.Not as much tightness as before.  If walks anywhere is out of breath.  Still on coumadin,  cont lovenox and coumadinAxillary LN was biopsied and was neg for lymphoma rec stay on same rx but stopped prednisone when rx ran out.    August 27, 2009 cc  since finished prednisone x 2 weeks worse doe room to room and now can't lie back to sleep on one pillow any more x 1 week with bilateral chest discomfort during deep breathing, minimal dry cough. --tx w/ steroid burst and d/c ace  August 31, 2009--Returns for persistent symptoms. Was seen 4 days ago, tx w/ steroid burst. Pt is very complicated. Since last 4 months has worsening of dyspnea. Initially dx w/ PE in 4/11 started on coumadin/lovenox bridge. CT showed smll right effusion and loculated left effusion. Over last 2 weeks continued w/ increased dsypnea. Tx w/ steroids. Xray showed locuated left effusion and increasing right effusion. She increased prednisone up to 40mg  , now on 30mg  -first day./ W. only minimal improvment. Still has DOE, and now increased cough w/ creamy mucus.   September 03, 2009 cough  and sob flat much better, still doe x > 100 ft which is a chronic complaint.  n         September 14, 2009 3:24 PM Still cannot breathe, if try to do anything is out of breath, has to lean forward. Is not coughing.  No mucus.  No real chest pain  LN bx was unrevealing Pt wiht recurrent R pleural effusion, when tapers prednisone effusion worsens.  Hx lupus   September 20, 2009 2:26 PM Pt underwent thoracentesis this week and then developed pain 1.5 L removed.  Mon was procedure.  Tue ok Wed felt pain on L side.  continue to worsen.  Itches on L side,  progressed throughout the day.  If breathe in is sharp pain R side is not the area of pain the pt is back on the coumadin  INR 1.0 but  still on therapeutic lovenox as a bridge. No real cough.  Dyspnea is better.   No fever or chills or sweats.  October 22, 2009 2:47 PM Pt in hospital for VATS of R pleural effusion.  Had lovenox bridging. 8/29- 10/09/09 per Dr Donata Clay. Still has pleuryx catheter.  There is minimal pleural fluid output so far.  The pt goes to the office weekly for TCTS recheck Dyspnea is better.   Pleuryx ? to stay until end of october. From Echart all pleural fluid c/s neg.  from vats.   INR 2.2   past week Pt was on keflex due to chest tube insertion site was infected. Pt also had candida in throat and rx diflucan and just finished this 9/19   11/19/09--Presents for an acute office visit. Complains of worsening SOB for 1 week (Decreased Prednisone to 10 mg for 1 week then SOB started again) - Clears throat alot - Mild wheezing - Denies cough - Increased Pred back to 20 mg /day on 11-15-09.  Last ov rec to taper steroids. When she got to 10mg  of prednisone noticed breathing not as good. Has ov with CVTS this afternoon. Today CXR w/ no increase in effusion. .  Last INR  was 2.8 10/26/09. She denies chest pain, calf pain. Has has some increased fluid retention esp in legs. Weight is up  7 lbs.  She deneis increased cough, congestion, fever. Denies chest pain,  orthopnea, hemoptysis, fever, n/v/d,  headache.   November 22, 2009 12:21 PM  Had developed more dyspnea and ? if fluid was building up.  Edema is better. weight is down 7 # Saw Tr[facility]low.  ? what to do prednisone.   December 20, 2009 12:08 PM Now on 17mg /d pred.  Dyspnea comes and goes.  Feels bloated at times , and did cough up mucus.  Mucus is gray material.  Occ sharp stabbing pain and is bilateral,  still gets pain up back into the neck and out through shoulders pulsating pain. Joints are an issue, esp R knee developed a tooth abscess R upper tooth and now on cleocin.  Had to have iron infusions twice for chronic anemiaJanuary  5, 2012 10:21  AM dyspnea is better.  able to walk farther . had sl congestion. coughed up some mucus. nasal stuffiness is noted and nose is dry  no real chest pain no fever now, dental issues are better   Current Medications (verified): 1)  Lyrica 75 Mg Caps (Pregabalin) .... Take 1 Capsule By Mouth Once A Day 2)  Paxil 40 Mg Tabs (Paroxetine Hcl) .... Take 1by Mouth Once Daily 3)  Aspirin 81 Mg Tbec (Aspirin) .... Take One By Mouth Daily 4)  Synthroid 200 Mcg  Tabs (Levothyroxine Sodium) .... Take One By Mouth Daily 5)  Synthroid 25 Mcg Tabs (Levothyroxine Sodium) .Marland Kitchen.. 1 Every Am Before Breakfast 6)  Coumadin 7.5 Mg Tabs (Warfarin Sodium) .... As Directed 7)  Prilosec 20 Mg Cpdr (Omeprazole) .Marland Kitchen.. 1 By Mouth Once Daily in Ams 8)  Plaquenil 200 Mg Tabs (Hydroxychloroquine Sulfate) .... Take 3 Tablet By Mouth Once A Day 9)  Bystolic 10 Mg  Tabs (Nebivolol Hcl) .... One Tablet Daily 10)  Lasix 20 Mg Tabs (Furosemide) .... Take 1 Tablet By Mouth Once A Day 11)  Clobetasol Propionate 0.05 % Oint (Clobetasol Propionate) .... As Needed On Ankles 12)  Alprazolam 0.5 Mg Tabs (Alprazolam) .Marland Kitchen.. 1 By Mouth Up To Two Times A Day As Needed Severe Anxiety 13)  Prednisone 10 Mg Tabs (Prednisone) .Marland Kitchen.. 1 By Mouth Qd 14)  Prednisone 1 Mg Tabs (Prednisone) .... 3 By Mouth Qd  Allergies: 1)  ! Avelox 2)  Amoxicillin 3)  Lipitor  Past History:  Past medical, surgical, family and social histories (including risk factors) reviewed, and no changes noted (except as noted below).  Past Medical History: Reviewed history from 01/08/2010 and no changes required. 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)  pleural effusions 2005 c/w lupus initial w/u Loculated empyema chronic on L.................................................Marland KitchenVanTright      -s/p VATS 06/2008  recurrent R effusion as pred tapered off July 2011     -Pleuryx catheter  placement 9/11 VanTrigt...Marland KitchenMarland KitchenMarland Kitchenremoved  9/11 hyperlipidemia obesitly  glomerulonephritis  hypothyroid anemia  iron deficiency    -IV dextran [place]  renal - [place] rheum - [facility] pulm- [place] [place] thoracic surgeon- DR VanTright heme   Past Surgical History: Reviewed history from 04/25/2008 and no changes required. Stress echo- neg. (04/1998) Superficial thrombus, right leg (01/2000) Hosp- pericardial effusion (06/2003) UTI- hematuria work up (04/2004) CT pelvis- neg. (05/2004) Pleurocentesis (10/2004) thoracentesis 2010 with pneumonia  Family History: Reviewed history from 04/06/2008 and no changes required. Sister with lupus GM with DM  Social History: Reviewed history from 10/22/2009 and no changes required. 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  socially x 10 years ( 1 pack/month)  Last cigerette Spring 2011  Review of Systems       The patient complains of shortness of breath with activity and chest pain.  The patient denies shortness of breath at rest, productive cough, non-productive cough, coughing up blood, irregular heartbeats, acid heartburn, indigestion, loss of appetite, weight change, abdominal pain, difficulty swallowing, sore throat, tooth/dental problems, headaches, nasal congestion/difficulty breathing through nose, sneezing, itching, ear ache, anxiety, depression, hand/feet swelling, joint stiffness or pain, rash, change in color of mucus, and fever.    Vital Signs:  Patient profile:   45 year old female Height:      65 inches O2 Sat:      96 % on Room air Temp:     98.0 degrees F oral Pulse rate:   71 / minute BP sitting:   104 / 80  Vitals Entered By: Kandice Hams CMA (February 07, 2010 10:05 AM)  O2 Flow:  Room air CC: occasional sob with exertion pt says not like it was" no sob no wheezing, no coughing Comments pt taking total 13mg  of prednisone per day.  *Note pt weight over 350  Physical Exam  Additional Exam:  Gen: Pleasant, obese, in no distress,  normal affect  ENT: No lesions,  mouth clear,  oropharynx clear, no postnasal drip Neck: No JVD, no TMG, no carotid bruits Lungs: No use of accessory muscles,  no change in decreased BS in LLL, open wound from prev chest tube healed . , no redness.  Cardiovascular: RRR, heart sounds normal, no murmur or gallops, venous insuff. changes.  Abdomen: soft and NT, no HSM,  BS normal Musculoskeletal: No deformities, no cyanosis or clubbing Neuro: alert, non focal Skin: Warm, scattered skin irritation, papules along arm.     Impression & Recommendations:  Problem # 1:  PLEURAL EFFUSION (ICD-511.9) Assessment Improved  Orders: Est. Patient Level III (16109)  resolved effusion on R, chronic empyema on L, pleuritis due to lupus plan taper prednisone further to 10mg /d and stay  Orders: Est. Patient Level IV (60454)  Medications Added to Medication List This Visit: 1)  Prednisone 10 Mg Tabs (Prednisone) .Marland Kitchen.. 1 by mouth qd 2)  Prednisone 1 Mg Tabs (Prednisone) .... 3 by mouth qd  Complete Medication List: 1)  Lyrica 75 Mg Caps (Pregabalin) .... Take 1 capsule by mouth once a day 2)  Paxil 40 Mg Tabs (Paroxetine hcl) .... Take 1by mouth once daily 3)  Aspirin 81 Mg Tbec (Aspirin) .... Take one by mouth daily 4)  Synthroid 200 Mcg Tabs (Levothyroxine sodium) .... Take one by mouth daily 5)  Synthroid 25 Mcg Tabs (Levothyroxine sodium) .Marland Kitchen.. 1 every am before breakfast 6)  Coumadin 7.5 Mg Tabs (Warfarin sodium) .... As directed 7)  Prilosec 20 Mg Cpdr (Omeprazole) .Marland Kitchen.. 1 by mouth once daily in ams 8)  Plaquenil 200 Mg Tabs (Hydroxychloroquine sulfate) .... Take 3 tablet by mouth once a day 9)  Bystolic 10 Mg Tabs (Nebivolol hcl) .... One tablet daily 10)  Lasix 20 Mg Tabs (Furosemide) .... Take 1 tablet by mouth once a day 11)  Clobetasol Propionate 0.05 % Oint (Clobetasol propionate) .... As needed on  ankles 12)  Alprazolam 0.5 Mg Tabs (Alprazolam) .Marland Kitchen.. 1 by mouth up to two times a day as needed severe anxiety 13)  Prednisone 10 Mg Tabs (Prednisone) .Marland Kitchen.. 1 by mouth qd 14)  Prednisone 1 Mg Tabs (Prednisone) .... 3 by mouth qd  Patient Instructions: 1)  Continue to reduce prednisone down by 1mg  every week until down to 10mg  a day and stay 2)  Return 3 months  Appended Document: Pulmonary OV fax william truslow

## 2010-03-07 NOTE — Progress Notes (Signed)
  Phone Note From Other Clinic Call back at [phone]   Caller: Referral Coordinator Summary of Call: Called Tammy at [facility] to schedule the LN Biopsy. She said the patient needs to be off Coumadin for 4 days for the biopsy to be done. I have faxed your order to them as well as as records from Dr Dwain Sarna and Dr Delford Field. She will give to the Radiologist to review and will call us back when she can be scheduled. Will call the patient also to let her know we are working on this for her. Initial call taken by: Carlton Adam,  Jun 13, 2009 11:46 AM  Follow-up for Phone Call        thanks for the help -- send this back when we get a date so I can adv pt re: coumadin   Follow-up by: [name] MD,  Jun 13, 2009 1:15 PM  Additional Follow-up for Phone Call Additional follow up Details #1::        The patient is scheduled to have her LN Biopsy at [facility] on 07/20/2009 at 8:30am. Patient has been notified by [facility]. They need a note from Dr Milinda Antis stating that it is okay for the patient to be off her Coumadin for the 4 days prior to her procedure. This note must be faxed to Tobi Bastos at [facility] Radiology dept at [phone]. Additional Follow-up by: Carlton Adam,  Jun 18, 2009 8:20 AM    Additional Follow-up for Phone Call Additional follow up Details #2::    I already did the note and put in Rena's in box Follow-up by: [name] MD,  Jun 18, 2009 8:59 AM  Additional Follow-up for Phone Call Additional follow up Details #3:: Details for Additional Follow-up Action Taken: Letter faxed to Tobi Bastos at [facility]. Additional Follow-up by: Carlton Adam,  Jun 19, 2009 11:34 AM

## 2010-03-07 NOTE — Progress Notes (Signed)
Summary: prescript  Phone Note Call from Patient   [name] Call For: wright Summary of Call: need lovenox refilled [facility]  [address] Initial call taken by: Rickard Patience,  September 21, 2009 11:44 AM  Follow-up for Phone Call        RX refilled.Reynaldo Minium CMA  September 21, 2009 11:45 AM    Pt aware of RX being sent.Reynaldo Minium CMA  September 21, 2009 11:52 AM     Prescriptions: LOVENOX 150 MG/ML SOLN (ENOXAPARIN SODIUM) 150mg  subcutaneously twice daily  #10 doses x 0   Entered by:   Reynaldo Minium CMA   Authorized by:   Storm Frisk MD   Signed by:   Reynaldo Minium CMA on 09/21/2009   Method used:   Electronically to        [facility]  [address] [id number]* (retail)       [address]       [place], [place]  [address]       Ph: [phone] or [phone]       Fax: [phone]   RxID:   [id number]

## 2010-03-07 NOTE — Progress Notes (Signed)
Summary: Dr Dwain Sarna called with consult  Phone Note From Other Clinic   Summary of Call: call from Dr Dwain Sarna to disc her case  her LN bx results were neg and although flow cytometry cannot be done on that -- he feels comfortable waiting 3 mo and re checking CT before recommending removal of node  if results are stable with no growth we can just monitor  Follow-up for Phone Call        please let pt know I spoke with Dr Dwain Sarna and he was re-assured by results would like to get CT of chest w/o contrast in 3 mo to document stability and go from there see if she is ok with that plan and I will do ref for the CT Follow-up by: [name] MD,  August 13, 2009 1:57 PM  Additional Follow-up for Phone Call Additional follow up Details #1::        Patient notified as instructed by telephone. Pt said she is OK with plan and cans schedule CT. Pt is here in office now.Lewanda Rife LPN  August 13, 2009 4:16 PM   did sched at visit today Additional Follow-up by: [name] MD,  August 13, 2009 4:43 PM

## 2010-03-07 NOTE — Progress Notes (Signed)
Summary: set up thoracentesis while pt off coumadin  Phone Note From Other Clinic   Caller: marian Call For: Ogechi Kuehnel Summary of Call: per [facility] w/ dr tower: pt is scheduled [facility]17. Armando Reichert says that dr Delford Field wanted to know when pt will be off coumadin so that his nurse could schedule an ultrasound thoracentesis while pt is "off coumadin". pt will be off coumadin beginning sun 6/12 through 6/16. call back # is [phone] Initial call taken by: Tivis Ringer, CNA,  Jun 15, 2009 3:58 PM  Follow-up for Phone Call        Will forward to PW as Mitzi Davenport  Jun 15, 2009 4:11 PM   Additional Follow-up for Phone Call Additional follow up Details #1::        Ok  see orders I sent to [facility] Additional Follow-up by: Storm Frisk MD,  Jun 15, 2009 4:22 PM    Additional Follow-up for Phone Call Additional follow up Details #2::    Left msg for Armando Reichert to call back.Ally Knodel CMA  Jun 15, 2009 4:29 PM  Armando Reichert says they have scheduled the lymph node Bx at [facility] on 07/20/2009 amd there should be no problem for our office to schedule the U/S thoracentesis at [facility] between 6/13 and 6/16 if that is what PW is wanting to do. Bjorn Loser has scheduled the U/S thoracentesis for 07/17/2009@ 9am at [facility] and will contact the patient. I will forward to PW so he is aware.   Follow-up by: Michel Bickers CMA,  Jun 15, 2009 4:48 PM  Additional Follow-up for Phone Call Additional follow up Details #3:: Details for Additional Follow-up Action Taken: ok

## 2010-03-07 NOTE — Letter (Signed)
Summary: Out of Work  [employer] [employer] [facility]  [address] [place], [place] [address]   Phone: [phone]  Fax: [phone]    May 24, 2009   Employee:  [name]    To Whom It May Concern:   For Medical reasons, please excuse the above named employee from work for the following dates:  Start:   05/16/2009  End:   May return to work on Tuesday, 05/29/2009  If you need additional information, please feel free to contact our office.         Sincerely,    Ruthe Mannan, MD

## 2010-03-07 NOTE — Assessment & Plan Note (Signed)
Summary: Pulmonary/ ext f/u for pleural effusions   Primary Provider/Referring Provider:  [name] MD  CC:  Followup.  Pt was seen by TP on  7/29 with SOB- she states that her breathing is still the same- no better or worse.  She denies any fever wheezing or cough.  No new complaints today.  Marland Kitchen  History of Present Illness: 58 yowf last smoked around 06/2009  with chronic  L empyema s/p VATS 2010.  Still with persistent disease L chest , not a candidate for decortication per [facility] Dr Donata Clay   Pt admitted 3/4-04/08/08.  Had sudden onset of feeling hot and flushed.  ? CVA ruled out but in ED found abn CXR ?PNA.  f/u ct chest showed loculated L pleural effusion.  Had been previously evaluated for same in past.  Tap showed dark brown fluid with negative pleural fluid cytology.  Had no positive micro data.    Dx lupus with nephritis and joint involvement.  Had serositis with pericardial and pleural effusion in the past.   Then was followed expectantly.   Dr Kellie Simmering is rheum MD.    May 29, 2009  This patient has not been seen since June 2010. Patient did well until 6 weeks ago, when she became dizzy and went to work. She went to the emergency room an IV was placed in the right arm. Cardiac workup was negative and she was sent home. She then devleoped  right arm pain and swelling.  She was dx with   superficial thrombophlebitis. She was treated with tramadol only.  She did not improve and returned on 16 May 2008 to the ED with  chest pain in the front and back. A CT scan of the chest revealed  pulmonary embolism. Right lower lobe.   rx with Lovenox and Coumadin bridging. Over time she improved, but then on 24 April,  she returned emergently for pleuritic cp >  Chest CT scan was repeated and showed resolution of right lower lobe pulmonary embolism. She was however, present with right pleural effusion. She is still having ongoing dyspnea and chest pain. She has a dry cough. There is no fever, chills,  or sweats. She is here now for further evaluation on referral from the emergency room.  July 13, 201  to ED and had more dyspnea and pressure.  CXR no real change.  The pt still with  L loculated effusion.   The prednisone was down to 10mg  /d and this is when this occured. Not as much tightness as before.  If walks anywhere is out of breath.  Still on coumadin,  cont lovenox and coumadin Axillary LN was biopsied and was neg for lymphoma rec stay on same rx but stopped prednisone when rx ran out.     August 27, 2009 cc  since finished prednisone x 2 weeks worse doe room to room and now can't lie back to sleep on one pillow any more x 1 week with bilateral chest discomfort during deep breathing, minimal dry cough. --tx w/ steroid burst and d/c ace  August 31, 2009--Returns for persistent symptoms. Was seen 4 days ago, tx w/ steroid burst. Pt is very complicated. Since last 4 months has worsening of dyspnea. Initially dx w/ PE in 4/11 started on coumadin/lovenox bridge. CT showed smll right effusion and loculated left effusion. Over last 2 weeks continued w/ increased dsypnea. Tx w/ steroids. Xray showed locuated left effusion and increasing right effusion. She increased prednisone up to  40mg  , now on 30mg  -first day./ W. only minimal improvment. Still has DOE, and now increased cough w/ creamy mucus.   September 03, 2009 cough and sob flat much better, still doe x > 100 ft which is a chronic complaint.  no leg swelling aches in joints or skin rash. Pt denies any significant sore throat, dysphagia, itching, sneezing,  nasal congestion or excess secretions,  fever, chills, sweats, unintended wt loss, pleuritic or exertional cp, hempoptysis, variability in activity tolerance  orthopnea pnd . Pt also denies any obvious fluctuation in symptoms with weather or environmental change or other alleviating or aggravating factors.            Current Medications (verified): 1)  Paxil 40 Mg Tabs (Paroxetine Hcl) ....  Take 1by Mouth Once Daily 2)  Aspirin 81 Mg Tbec (Aspirin) .... Take One By Mouth Daily 3)  Synthroid 200 Mcg  Tabs (Levothyroxine Sodium) .... Take One By Mouth Daily 4)  Clobestrol Oinment .... Apply To Ankles As Needed 5)  Alprazolam 0.5 Mg Tabs (Alprazolam) .Marland Kitchen.. 1 By Mouth Up To Two Times A Day As Needed Severe Anxiety 6)  Coumadin 7.5 Mg Tabs (Warfarin Sodium) .... Take One Tablet By Mouth Daily 7)  Prilosec 20 Mg Cpdr (Omeprazole) .Marland Kitchen.. 1 By Mouth Once Daily in Ams 8)  Vicodin ?mg .... At Bedtime As Needed 9)  Plaquenil 200 Mg Tabs (Hydroxychloroquine Sulfate) .... Take 1 Tablet By Mouth Once A Day 10)  Crestor 10 Mg Tabs (Rosuvastatin Calcium) .Marland Kitchen.. 1 By Mouth Once Daily 11)  Prednisone 10 Mg  Tabs (Prednisone) .... 4 Each Am X 3 Days,  3 X 3 Days, 2x3 Days, and 1x3 Days 12)  Bystolic 5 Mg  Tabs (Nebivolol Hcl) .... One Tablet Daily 13)  Levaquin 750 Mg Tabs (Levofloxacin) .Marland Kitchen.. 1 By Mouth Once Daily  Allergies (verified): 1)  ! 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)  pleural effusions 2005 c/w lupus initial w/u Loculated empyema chronic on L.................................................Marland KitchenVanTright      -s/p VATS 06/2008  recurrent R effusion as pred tapered off July 2011 hyperlipidemia obesitly  glomerulonephritis  hypothyroid  renal - [facility] - [facility]- [facility] thoracic surgeon- DR VanTright heme   Vital Signs:  Patient profile:   45 year old female Weight:      338 pounds O2 Sat:      94 % on Room air Temp:     98.0 degrees F oral Pulse rate:   111 / minute BP sitting:   160 / 94  (right arm) Cuff size:   large  Vitals Entered By: Vernie Murders (September 03, 2009 12:10 PM)  O2 Flow:  Room air  Physical Exam  Additional Exam:  Gen: Pleasant, obese, in no distress,  normal affect wt 340  > 338 September 03, 2009  ENT: No lesions,  mouth clear,  oropharynx clear, no postnasal drip Neck: No JVD, no TMG, no carotid bruits Lungs: No use of accessory muscles, decreased BS in bases  = bilaterally, no rub Cardiovascular: RRR, heart sounds normal, no murmur or gallops, venous insuff. changes.  Abdomen: soft and NT, no HSM,  BS normal Musculoskeletal: No deformities, no cyanosis or clubbing Neuro: alert, non focal Skin: Warm, scattered skin irritation, papules along arm.  Impression & Recommendations:  Problem # 1:  PLEURAL EFFUSION (ICD-511.9) c/w lupus pleuritis although not as painful as typical of this disorder.  Rx for now with plaquenil and Prednisone 20 mg daily  Problem # 2:  COUGH (ICD-786.2)  Still with freq throat clearing off ace x 1 week advised may take up to 6 weeks for full benefit off ace to be appreciated.    Problem # 3:  HYPERTENSION, ESSENTIAL NOS (ICD-401.9)  Best choice for now is Bystolic, the most beta -1  selective Beta blocker available in sample form, with bisoprolol the most selective generic choice  on the market, but will need to increase to 10 mg daily    ACE inhibitors are problematic in  pts with airway complaints because  even experienced pulmonologists (eg me!) can't always distinguish ace effects from all the other nonspecific complaints that resp patients experienc.   By themselves they don't actually cause a problem, much like oxygen can't by itself start a fire, but they certainly serve as a powerful catalyst or enhancer for any "fire"  or inflammatory process in the upper airway, be it caused by an ET  tube or more commonly reflux (especially in the obese or pts with known GERD or who are on biphoshonates).   Medications Added to Medication List This Visit: 1)  Prednisone 10 Mg Tabs (Prednisone) .... 2 daily 2)  Bystolic 10 Mg Tabs (Nebivolol hcl) .... One tablet daily  Other Orders: TLB-BMP (Basic Metabolic Panel-BMET)  (80048-METABOL) TLB-CBC Platelet - w/Differential (85025-CBCD) TLB-Hepatic/Liver Function Pnl (80076-HEPATIC) TLB-TSH (Thyroid Stimulating Hormone) (84443-TSH) TLB-BNP (B-Natriuretic Peptide) (83880-BNPR) TLB-Sedimentation Rate (ESR) (85652-ESR) Est. Patient Level IV (81191)  Patient Instructions: 1)  Prednisone 20 mg one daily 2)  Bystolic increase to 10 mg daily  3)  See Dr Delford Field toward end of next week for  follow up cxr but if condition worsens go to ER  4)  Copy sent to: [facility]/ [place] 5)  GERD (REFLUX)  is a common cause of respiratory symptoms. It commonly presents without heartburn and can be treated with medication, but also with lifestyle changes including avoidance of late meals, excessive alcohol, smoking cessation, and avoid fatty foods, chocolate, peppermint, colas, red wine, and acidic juices such as orange juice. NO MINT OR MENTHOL PRODUCTS SO NO COUGH DROPS  6)  USE SUGARLESS CANDY INSTEAD (jolley ranchers)  7)  NO OIL BASED VITAMINS

## 2010-03-07 NOTE — Progress Notes (Signed)
Summary: wants temp handicapped placard  Phone Note Call from Patient Call back at Home Phone [phone]   [name] Call For: [name] MD Summary of Call: Pt has not had her lung biopsy yet and she is asking if she can get a temporary handicapped placard for her car. She is having difficulty walking through the parking lot at her job.    Form is on your shelf. Initial call taken by: Lowella Petties CMA,  Jun 20, 2009 12:46 PM  Follow-up for Phone Call        no problem form done and in nurse in box  Follow-up by: [name] MD,  Jun 20, 2009 1:34 PM  Additional Follow-up for Phone Call Additional follow up Details #1::        Left message for patient to call back. form at front desk for pick up.Lewanda Rife LPN  Jun 20, 2009 4:07 PM   Patient notified as instructed by telephone. Lewanda Rife LPN  Jun 21, 2009 8:42 AM

## 2010-03-07 NOTE — Letter (Signed)
Summary: Aundra Dubin MD  Aundra Dubin MD   Imported By: Sherian Rein 06/28/2009 14:06:01  _____________________________________________________________________  External Attachment:    Type:   Image     Comment:   External Document

## 2010-03-07 NOTE — Progress Notes (Signed)
Summary: [name] appt   Phone Note Call from Patient Call back at Home Phone [phone]   [name] Call For: Erabella Kuipers Summary of Call: pt went to the er for plural effusion and needs an asap appt to see dr Delford Field Initial call taken by: Lacinda Axon,  May 28, 2009 4:02 PM  Follow-up for Phone Call        Called above number.  Number did not ring, went directly to voicemail.  [facility] Crystal Yetta Barre RN  May 28, 2009 4:57 PM  [name]. pt schedueld to see PW tomorrow at 9:50 am. Carron Curie CMA  May 28, 2009 5:04 PM

## 2010-03-07 NOTE — Assessment & Plan Note (Signed)
Summary: SWOLLEN RIGHT ARM,HAND/CLE   Vital Signs:  Patient profile:   45 year old female Height:      65 inches Weight:      334.13 pounds BMI:     55.80 Temp:     98.4 degrees F oral Pulse rate:   80 / minute Pulse rhythm:   regular BP sitting:   102 / 70  (left arm) Cuff size:   large  Vitals Entered By: Delilah Shan CMA Duncan Dull) (May 08, 2009 12:01 PM) CC: Right arm and hand swollen   History of Present Illness: 45 yo here for 1 week of worsening lower right arm swelling and pain.  Was in ED two weeks ago for panic attack. IV was placed in right arm, since it was taken out has had some redness and streaking in her arm but no swelling or pain. Never had any fevers, nausea, or vomiting with this. Taking ASA 81 mg daily. No CP or SOB.  PMH significant for prior DVT and Lupus.    Current Medications (verified): 1)  Paxil 40 Mg Tabs (Paroxetine Hcl) .... Take 1 1/2 By Mouth Once Daily 2)  Aspirin 81 Mg Tbec (Aspirin) .... Take One By Mouth Daily 3)  Synthroid 25 Mcg Tabs (Levothyroxine Sodium) .... Take 1 Tablet By Mouth Once A Day 4)  Synthroid 200 Mcg  Tabs (Levothyroxine Sodium) .... Take One By Mouth Daily Along With 50 Micrograms Tab 5)  Enalapril Maleate 10 Mg Tabs (Enalapril Maleate) .... One By Mouth Daily 6)  Clobestrol Oinment .... Apply To Ankles As Needed 7)  Alprazolam 0.5 Mg Tabs (Alprazolam) .Marland Kitchen.. 1 By Mouth Up To Two Times A Day As Needed Severe Anxiety  Allergies: 1)  ! Avelox 2)  Amoxicillin 3)  Lipitor  Review of Systems      See HPI General:  Denies chills. CV:  Denies chest pain or discomfort, difficulty breathing at night, difficulty breathing while lying down, fainting, fatigue, leg cramps with exertion, palpitations, and shortness of breath with exertion. Resp:  Denies cough, coughing up blood, and shortness of breath.  Physical Exam  General:  morbidly obese- but well appearing  Mouth:  pharynx pink and moist.   Lungs:  Normal  respiratory effort, chest expands symmetrically. Lungs are clear to auscultation, no crackles or wheezes. Heart:  Normal rate and regular rhythm. S1 and S2 normal without gallop, murmur, click, rub or other extra sounds. Extremities:  right lower arm- palpable cord, warm to touch with overlying erythema Psych:  normal affect, talkative and pleasant    Impression & Recommendations:  Problem # 1:  LOCALIZED SUPERFICIAL SWELLING MASS OR LUMP (ICD-782.2) Assessment New Very concerning for DVT especially considering hypercoagulable history. Will get stat doppler/ultrasound. Advised continuing ASA. Orders: Radiology Referral (Radiology)  Complete Medication List: 1)  Paxil 40 Mg Tabs (Paroxetine hcl) .... Take 1 1/2 by mouth once daily 2)  Aspirin 81 Mg Tbec (Aspirin) .... Take one by mouth daily 3)  Synthroid 25 Mcg Tabs (Levothyroxine sodium) .... Take 1 tablet by mouth once a day 4)  Synthroid 200 Mcg Tabs (Levothyroxine sodium) .... Take one by mouth daily along with 50 micrograms tab 5)  Enalapril Maleate 10 Mg Tabs (Enalapril maleate) .... One by mouth daily 6)  Clobestrol Oinment  .... Apply to ankles as needed 7)  Alprazolam 0.5 Mg Tabs (Alprazolam) .Marland Kitchen.. 1 by mouth up to two times a day as needed severe anxiety  Patient Instructions: 1)  Please stop by  to see Aram Beecham on your way out to set up your ultrasound.  Current Allergies (reviewed today): ! AVELOX AMOXICILLIN LIPITOR

## 2010-03-07 NOTE — Progress Notes (Signed)
Summary: nos appt  Phone Note Call from Patient   Caller: [email or web address]  Call For: wert Summary of Call: In ref to nos from 8/8, pt has appt on 8/12 with Dr. Delford Field. Initial call taken by: Darletta Moll,  September 11, 2009 9:15 AM

## 2010-03-07 NOTE — Progress Notes (Signed)
Summary: Lab results  Phone Note Outgoing Call   Reason for Call: Discuss lab or test results Summary of Call: call pt and tell her labs all ok keep scheduled u/s thoracentesis appt Initial call taken by: Storm Frisk MD,  September 16, 2009 12:30 PM  Follow-up for Phone Call        [facility] Gweneth Dimitri RN  September 17, 2009 10:36 AM  Spoke with pt.  Informed her of above lab results and recs per PW.  She verbalized understanding .  Follow-up by: Gweneth Dimitri RN,  September 17, 2009 10:53 AM

## 2010-03-07 NOTE — Progress Notes (Signed)
Summary: lung biopsy-pt returned call  Phone Note Call from Patient   [name] Call For: wright Summary of Call: pt wants to know "what's the next step" re: lung biopsy. [phone] NOTE: pt is aware that dr Delford Field is off until tomorrow.  Initial call taken by: Tivis Ringer, CNA,  Jun 06, 2009 12:07 PM  Follow-up for Phone Call        [facility].Marria Mathison CMA  Jun 06, 2009 12:20 PM  PT RETURNED CALL FROM TRIAGE. CALL [phone]. Tivis Ringer, CNA  Jun 06, 2009 12:34 PM  Pt states she saw Dr. Dwain Sarna and he wnats to do the biopsy with a needle instead of "actual surgery" per pt. Dr Tyson Dense was supposed top call PW and discuss this with him. The pt wants to know has Dr. Delford Field spoke to Dr. Dwain Sarna about the biopsy yet? Pt just wants to know what the next step is. Please advise.Carron Curie CMA  Jun 06, 2009 1:23 PM   Additional Follow-up for Phone Call Additional follow up Details #1::        I spoke to the pt we will wait one month for u/s procedure she is to call back sooner if she worsens give pt return to work note that is in emr Additional Follow-up by: Storm Frisk MD,  Jun 07, 2009 10:44 AM    Additional Follow-up for Phone Call Additional follow up Details #2::    letter printed and left at front for pt to pick-up. pt aware. Carron Curie CMA  Jun 07, 2009 10:47 AM

## 2010-03-07 NOTE — Assessment & Plan Note (Signed)
Summary: Pulmonary OV   Primary Provider/Referring Provider:  [name] MD  CC:  HFU.  Pt states breathing is "much better" since discharge.  Breathing at baseline.  Chest tightness but believes it is d/t surgery on 8.29.  Denies wheezing and chest tightness.  Marland Kitchen  History of Present Illness: 45 yowf last smoked around 06/2009  with chronic  L empyema s/p VATS 2010.  Still with persistent disease L chest , not a candidate for decortication per TCTS Dr Donata Clay   Pt admitted 3/4-04/08/08.  Had sudden onset of feeling hot and flushed.  ? CVA ruled out but in ED found abn CXR ?PNA.  f/u ct chest showed loculated L pleural effusion.  Had been previously evaluated for same in past.  Tap showed dark brown fluid with negative pleural fluid cytology.  Had no positive micro data.    Dx lupus with nephritis and joint involvement.  Had serositis with pericardial and pleural effusion in the past.   Then was followed expectantly.   Dr Kellie Simmering is rheum MD.    May 29, 2009  This patient has not been seen since June 2010. Patient did well until 6 weeks ago, when she became dizzy and went to work. She went to the emergency room an IV was placed in the right arm. Cardiac workup was negative and she was sent home. She then devleoped  right arm pain and swelling.  She was dx with   superficial thrombophlebitis. She was treated with tramadol only.  She did not improve and returned on 16 May 2008 to the ED with  chest pain in the front and back. A CT scan of the chest revealed  pulmonary embolism. Right lower lobe.   rx with Lovenox and Coumadin bridging. Over time she improved, but then on 24 April,  she returned emergently for pleuritic cp >  Chest CT scan was repeated and showed resolution of right lower lobe pulmonary embolism. She was however, present with right pleural effusion. She is still having ongoing dyspnea and chest pain. She has a dry cough. There is no fever, chills, or sweats. She is here now for  further evaluation on referral from the emergency room.  August 15, 2009  to ED and had more dyspnea and pressure.  CXR no real change.  The pt still with  L loculated effusion.   The prednisone was down to 10mg  /d and this is when this occured. Not as much tightness as before.  If walks anywhere is out of breath.  Still on coumadin,  cont lovenox and coumadin Axillary LN was biopsied and was neg for lymphoma rec stay on same rx but stopped prednisone when rx ran out.     August 27, 2009 cc  since finished prednisone x 2 weeks worse doe room to room and now can't lie back to sleep on one pillow any more x 1 week with bilateral chest discomfort during deep breathing, minimal dry cough. --tx w/ steroid burst and d/c ace  August 31, 2009--Returns for persistent symptoms. Was seen 4 days ago, tx w/ steroid burst. Pt is very complicated. Since last 4 months has worsening of dyspnea. Initially dx w/ PE in 4/11 started on coumadin/lovenox bridge. CT showed smll right effusion and loculated left effusion. Over last 2 weeks continued w/ increased dsypnea. Tx w/ steroids. Xray showed locuated left effusion and increasing right effusion. She increased prednisone up to 40mg  , now on 30mg  -first day./ W. only minimal improvment.  Still has DOE, and now increased cough w/ creamy mucus.   September 03, 2009 cough and sob flat much better, still doe x > 100 ft which is a chronic complaint.  no leg swelling aches in joints or skin rash. Pt denies any significant sore throat, dysphagia, itching, sneezing,  nasal congestion or excess secretions,  fever, chills, sweats, unintended wt loss, pleuritic or exertional cp, hempoptysis, variability in activity tolerance  orthopnea pnd . Pt also denies any obvious fluctuation in symptoms with weather or environmental change or other alleviating or aggravating factors.          September 14, 2009 3:24 PM Still cannot breathe, if try to do anything is out of breath, has to lean forward. Is  not coughing.  No mucus.  No real chest pain  LN bx was unrevealing Pt wiht recurrent R pleural effusion, when tapers prednisone effusion worsens.  Hx lupus   September 20, 2009 2:26 PM Pt underwent thoracentesis this week and then developed pain 1.5 L removed.  Mon was procedure.  Tue ok Wed felt pain on L side.  continue to worsen.  Itches on L side,  progressed throughout the day.  If breathe in is sharp pain R side is not the area of pain the pt is back on the coumadin  INR 1.0 but still on therapeutic lovenox as a bridge. No real cough.  Dyspnea is better.   No fever or chills or sweats.  October 22, 2009 2:47 PM Pt in hospital for VATS of R pleural effusion.  Had lovenox bridging. 8/29- 10/09/09 per Dr Donata Clay. Still has pleuryx catheter.  There is minimal pleural fluid output so far.  The pt goes to the office weekly for TCTS recheck Dyspnea is better.   Pleuryx ? to stay until end of october. From Echart all pleural fluid c/s neg.  from vats.   INR 2.2   past week Pt was on keflex due to chest tube insertion site was infected. Pt also had candida in throat and rx diflucan and just finished this 9/19    Preventive Screening-Counseling & Management  Alcohol-Tobacco     Smoking Status: quit     Packs/Day: 1-2 cigs a day     Year Started: 1986     Year Quit: 2002  Current Medications (verified): 1)  Paxil 40 Mg Tabs (Paroxetine Hcl) .... Take 1by Mouth Once Daily 2)  Aspirin 81 Mg Tbec (Aspirin) .... Take One By Mouth Daily 3)  Synthroid 200 Mcg  Tabs (Levothyroxine Sodium) .... Take One By Mouth Daily 4)  Clobestrol Oinment .... Apply To Ankles As Needed 5)  Alprazolam 0.5 Mg Tabs (Alprazolam) .Marland Kitchen.. 1 By Mouth Up To Two Times A Day As Needed Severe Anxiety 6)  Coumadin 7.5 Mg Tabs (Warfarin Sodium) .... As Directed 7)  Prilosec 20 Mg Cpdr (Omeprazole) .Marland Kitchen.. 1 By Mouth Once Daily in Ams 8)  Percocet 5-325 Mg Tabs (Oxycodone-Acetaminophen) .Marland Kitchen.. 1-2 Every 4 Hours As Needed 9)   Plaquenil 200 Mg Tabs (Hydroxychloroquine Sulfate) .... Take 3 Tablet By Mouth Once A Day 10)  Prednisone 10 Mg  Tabs (Prednisone) .... 40mg  Once Daily 11)  Bystolic 10 Mg  Tabs (Nebivolol Hcl) .... One Tablet Daily 12)  Synthroid 25 Mcg Tabs (Levothyroxine Sodium) .Marland Kitchen.. 1 Every Am Before Breakfast 13)  Lasix 40 Mg Tabs (Furosemide) .... As Needed 14)  Lyrica 75 Mg Caps (Pregabalin) .... Take 1 Capsule By Mouth Once A Day 15)  Cephalexin  500 Mg Tabs (Cephalexin) .... One By Mouth Three Times A Day  Allergies (verified): 1)  ! Avelox 2)  Amoxicillin 3)  Lipitor  Past History:  Past medical, surgical, family and social histories (including risk factors) reviewed, and no changes noted (except as noted below).  Past Medical History: Reviewed history from 09/03/2009 and no changes required. 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 Stress echo- neg. (04/1998) Superficial thrombus, right leg (01/2000) Hosp- pericardial effusion (06/2003) UTI- hematuria work up (04/2004) CT pelvis- neg. (05/2004) Pleurocentesis (10/2004) thoracentesis 2010 with pneumonia  Family History: Reviewed history from 04/06/2008 and no changes required. Sister with lupus GM with DM  Social History: Reviewed history from 04/06/2008 and no changes required. 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  socially x 10 years ( 1  pack/month)  Last cigerette Spring 2011  Review of Systems       The patient complains of shortness of breath with activity.  The patient denies shortness of breath at rest, productive cough, non-productive cough, coughing up blood, chest pain, irregular heartbeats, acid heartburn, indigestion, loss of appetite, weight change, abdominal pain, difficulty swallowing, sore throat, tooth/dental problems, headaches, nasal congestion/difficulty breathing through nose, sneezing, itching, ear ache, anxiety, depression, hand/feet swelling, joint stiffness or pain, rash, change in color of mucus, and fever.    Vital Signs:  Patient profile:   45 year old female Height:      65 inches Weight:      349 pounds BMI:     58.29 O2 Sat:      94 % on Room air Temp:     98.4 degrees F oral Pulse rate:   90 / minute BP sitting:   132 / 80  (left arm) Cuff size:   large  Vitals Entered By: Gweneth Dimitri RN (October 22, 2009 2:34 PM)  O2 Flow:  Room air CC: HFU.  Pt states breathing is "much better" since discharge.  Breathing at baseline.  Chest tightness but believes it is d/t surgery on 8.29.  Denies wheezing and chest tightness.    Does patient need assistance? Ambulation Impaired:Risk for fall Comments Medications reviewed with patient Daytime contact number verified with patient. Gweneth Dimitri RN  October 22, 2009 2:35 PM    Physical Exam  Additional Exam:  Gen: Pleasant, obese, in no distress,  normal affect wt 340 > 338 September 03, 2009 > 339 Aug 12>  338 Aug 18>>349 9/19 ENT: No lesions,  mouth clear,  oropharynx clear, no postnasal drip Neck: No JVD, no TMG, no carotid bruits Lungs: No use of accessory muscles, improved BS R lung,  no change in decreased BS in LLL, open wound packed R lateral chest wall at site of chest tube Cardiovascular: RRR, heart sounds normal, no murmur or gallops, venous insuff. changes.  Abdomen: soft and NT, no HSM,  BS normal Musculoskeletal: No deformities,  no cyanosis or clubbing Neuro: alert, non focal Skin: Warm, scattered skin irritation, papules along arm.  Impression & Recommendations:  Problem # 1:  PLEURAL EFFUSION (ICD-511.9) Assessment Improved exudative pleural effusion s/p vats drainage with talc sclerosis.  All c/s neg from VATS.  No pleural biopsy taken genesis of pleuritis felt to be due to SLE serositis plan  per TCTS pleuryx catheter for now slow taper of prednisone  Orders: Est. Patient Level IV (04540)  Medications Added to Medication List This Visit: 1)  Coumadin 7.5 Mg Tabs (Warfarin sodium) .... As directed 2)  Percocet 5-325 Mg Tabs (Oxycodone-acetaminophen) .Marland Kitchen.. 1-2 every 4 hours as needed 3)  Prednisone 10 Mg Tabs (Prednisone) .... 40mg  once daily 4)  Prednisone 10 Mg Tabs (Prednisone) .... Reduce to 30mg  per day for 5 days then reduce to 20mg  per day for 5 days then 10mg  daily and stay 5)  Lasix 40 Mg Tabs (Furosemide) .... As needed 6)  Lyrica 75 Mg Caps (Pregabalin) .... Take 1 capsule by mouth once a day 7)  Cephalexin 500 Mg Tabs (Cephalexin) .... One by mouth three times a day 8)  Cephalexin  .... Take 1 tablet by mouth three times a day  Complete Medication List: 1)  Paxil 40 Mg Tabs (Paroxetine hcl) .... Take 1by mouth once daily 2)  Aspirin 81 Mg Tbec (Aspirin) .... Take one by mouth daily 3)  Synthroid 200 Mcg Tabs (Levothyroxine sodium) .... Take one by mouth daily 4)  Clobestrol Oinment  .... Apply to ankles as needed 5)  Alprazolam 0.5 Mg Tabs (Alprazolam) .Marland Kitchen.. 1 by mouth up to two times a day as needed severe anxiety 6)  Coumadin 7.5 Mg Tabs (Warfarin sodium) .... As directed 7)  Prilosec 20 Mg Cpdr (Omeprazole) .Marland Kitchen.. 1 by mouth once daily in ams 8)  Percocet 5-325 Mg Tabs (Oxycodone-acetaminophen) .Marland Kitchen.. 1-2 every 4 hours as needed 9)  Plaquenil 200 Mg Tabs (Hydroxychloroquine sulfate) .... Take 3 tablet by mouth once a day 10)  Prednisone 10 Mg Tabs (Prednisone) .... Reduce to 30mg  per day  for 5 days then reduce to 20mg  per day for 5 days then 10mg  daily and stay 11)  Bystolic 10 Mg Tabs (Nebivolol hcl) .... One tablet daily 12)  Synthroid 25 Mcg Tabs (Levothyroxine sodium) .Marland Kitchen.. 1 every am before breakfast 13)  Lasix 40 Mg Tabs (Furosemide) .... As needed 14)  Lyrica 75 Mg Caps (Pregabalin) .... Take 1 capsule by mouth once a day 15)  Cephalexin 500 Mg Tabs (Cephalexin) .... One by mouth three times a day  Patient Instructions: 1)  Reduce prednisone to 30mg  per day for 5days, then reduce by 10mg  every 5 days until at 10mg  per day and stay 2)  No other medication changes 3)  Return one month [place] office    Appended Document: Pulmonary OV fax Kathlee Nations Trigt

## 2010-03-07 NOTE — Letter (Signed)
Summary: Generic Letter  [place] [facility]  [address] [place], [place] [address]   Phone: [phone]  Fax: [phone]    06/11/2009  [name] [address] [place], [place]  [address]  To whom it may concern,   [name] is medically clear to return to work Jun 12, 2009.    Sincerely,   Roxy Manns MD

## 2010-03-07 NOTE — Progress Notes (Signed)
Summary: talk to nurse  Phone Note Call from Patient Call back at Home Phone [phone]   [name] Call For: [facility]right Reason for Call: Talk to Nurse Summary of Call: Wants to know when she can expect an improvement in her breathing. Initial call taken by: Darletta Moll,  August 24, 2009 4:11 PM  Follow-up for Phone Call        Called, spoke with pt.  Pt states she was seen last week and left with the impression "we'll see how it goes."  States breathing is worse since OV-feels like someone is sitting on chest and stomach, can't sleep flat now, and having increased SOB with any kind of exertion-even when standing up.  OV scheduled with MW for Monday morning at 10:20-pt aware.  Pt advise if sxs worsen before OV go to Urgent Care or ER.  She verbalized understanding. Gweneth Dimitri RN  August 24, 2009 4:32 PM      Appended Document: talk to nurse I agree with with this  pwwwwwww

## 2010-03-07 NOTE — Progress Notes (Signed)
Summary: nos appt  Phone Note Call from Patient   Caller: [email or web address]  Call For: [facility] Summary of Call: In ref to nos from 8/22, pt states she will call tomorrow and rsc. Initial call taken by: Darletta Moll,  September 25, 2009 4:13 PM

## 2010-03-07 NOTE — Progress Notes (Signed)
Summary: Paxil 40mg  and Synthroid refills  Phone Note Refill Request Call back at [phone] cell Message from:  Patient on August 17, 2009 9:38 AM  Refills Requested: Medication #1:  PAXIL 40 MG TABS Take 1by mouth once daily  Medication #2:  SYNTHROID 200 MCG  TABS take one by mouth daily Pt request refills for Paxil and Synthroid to go to [facility] [phone]. Pt said she does not want to do mail order right now.Please advise.    Method Requested: Telephone to Pharmacy Initial call taken by: Lewanda Rife LPN,  August 17, 2009 9:40 AM  Follow-up for Phone Call        can have each #30 with 11 ref -thanks   Follow-up by: [name] MD,  August 17, 2009 10:16 AM  Additional Follow-up for Phone Call Additional follow up Details #1::        Medication sent electrnically to [facility] as instructed. Left message for patient to call back. Lewanda Rife LPN  August 17, 2009 11:00 AM   Patient notified as instructed by telephone. Lewanda Rife LPN  August 17, 2009 2:44 PM     Prescriptions: SYNTHROID 200 MCG  TABS (LEVOTHYROXINE SODIUM) take one by mouth daily  #30 x 11   Entered by:   Lewanda Rife LPN   Authorized by:   [name] MD   Signed by:   Lewanda Rife LPN on 62/13/0865   Method used:   Electronically to        [id number] (retail)       [address]       [place], [place]  [address]       Ph: [phone] or [phone]       Fax: [phone]   RxID:   [id number] PAXIL 40 MG TABS (PAROXETINE HCL) Take 1by mouth once daily  #30 x 11   Entered by:   Lewanda Rife LPN   Authorized by:   [name] MD   Signed by:   Lewanda Rife LPN on 88/41/6606   Method used:   Electronically to        [id number] (retail)       [address]       [place], [place]  [address]       Ph: [phone] or [phone]       Fax: [phone]   RxID:   [id number]

## 2010-03-21 NOTE — Letter (Signed)
Summary: [facility]   [facility]   Imported By: Kassie Mends 03/13/2010 08:45:33  _____________________________________________________________________  External Attachment:    Type:   Image     Comment:   External Document

## 2010-04-04 ENCOUNTER — Ambulatory Visit: Payer: Self-pay

## 2010-04-05 ENCOUNTER — Ambulatory Visit: Payer: Self-pay

## 2010-04-05 ENCOUNTER — Ambulatory Visit (INDEPENDENT_AMBULATORY_CARE_PROVIDER_SITE_OTHER): Payer: Managed Care, Other (non HMO)

## 2010-04-05 ENCOUNTER — Encounter: Payer: Self-pay | Admitting: Family Medicine

## 2010-04-05 DIAGNOSIS — Z5181 Encounter for therapeutic drug level monitoring: Secondary | ICD-10-CM

## 2010-04-05 DIAGNOSIS — Z7901 Long term (current) use of anticoagulants: Secondary | ICD-10-CM

## 2010-04-05 DIAGNOSIS — I2699 Other pulmonary embolism without acute cor pulmonale: Secondary | ICD-10-CM

## 2010-04-11 ENCOUNTER — Encounter: Payer: Self-pay | Admitting: Critical Care Medicine

## 2010-04-11 ENCOUNTER — Encounter: Payer: Self-pay | Admitting: Family Medicine

## 2010-04-11 NOTE — Medication Information (Signed)
Summary: PT/INR/CLE   PCP: Colon Flattery [facility] MD Indication 1: P.E. PT 13.1 INR RANGE 2.5-3.5           Allergies: 1)  ! Avelox 2)  Amoxicillin 3)  Lipitor  Anticoagulation Management History:      Negative risk factors for bleeding include an age less than 44 years old.  The bleeding index is 'low risk'.  Positive CHADS2 values include History of HTN.  Negative CHADS2 values include Age > 26 years old.  Her last INR was 2.4 and today's INR is 1.1.  Prothrombin time is 13.1.    Anticoagulation Management Assessment/Plan:      [name] current anticoagulation dose is Coumadin 7.5 mg tabs: as directed.  The next INR is due  1week.        Laboratory Results   Blood Tests   Date/Time Recieved: April 05, 2010 4:35 PM  Date/Time Reported: April 05, 2010 4:35 PM   PT: 13.1 s   (Normal Range: 10.6-13.4)  INR: 1.1   (Normal Range: 0.88-1.12   Therap INR: 2.0-3.5)      ANTICOAGULATION RECORD PREVIOUS REGIMEN & LAB RESULTS Anticoagulation Diagnosis:  P.E. on  05/24/2009 Previous INR Goal Range:  2.5-3.5 on  05/24/2009 Previous INR:  2.4 on  01/04/2010 Previous Coumadin Dose(mg):   11.25mg  qd, 7.5mg  T,TH on  12/04/2009 Previous Regimen:  11.25mg  qd, 7.5mg  T,TH on  11/23/2009 Previous Coagulation Comments:  . on  11/23/2009  NEW REGIMEN & LAB RESULTS Current INR: 1.1 Regimen: 15mg  x 2 days then resume 11.25mg  qd, 7.5mg  T,TH Coagulation Comments: missed 11.25mg  dose Provider: tower      Repeat testing in:  1week MEDICATIONS LYRICA 75 MG CAPS (PREGABALIN) Take 1 capsule by mouth once a day PAXIL 40 MG TABS (PAROXETINE HCL) Take 1by mouth once daily ASPIRIN 81 MG TBEC (ASPIRIN) Take one by mouth daily SYNTHROID 200 MCG  TABS (LEVOTHYROXINE SODIUM) take one by mouth daily SYNTHROID 25 MCG TABS (LEVOTHYROXINE SODIUM) 1 every am before breakfast COUMADIN 7.5 MG TABS (WARFARIN SODIUM) as directed PRILOSEC 20 MG CPDR (OMEPRAZOLE) 1 by mouth once daily in ams PLAQUENIL  200 MG TABS (HYDROXYCHLOROQUINE SULFATE) Take 3 tablet by mouth once a day BYSTOLIC 10 MG  TABS (NEBIVOLOL HCL) One tablet daily LASIX 20 MG TABS (FUROSEMIDE) Take 1 tablet by mouth once a day CLOBETASOL PROPIONATE 0.05 % OINT (CLOBETASOL PROPIONATE) as needed on ankles ALPRAZOLAM 0.5 MG TABS (ALPRAZOLAM) 1 by mouth up to two times a day as needed severe anxiety PREDNISONE 10 MG TABS (PREDNISONE) 1 by mouth qd PREDNISONE 1 MG TABS (PREDNISONE) 3 by mouth qd  Dose has been reviewed with patient or caretaker during this visit.  Reviewed by: Allison Quarry  Anticoagulation Visit Questionnaire      Coumadin dose missed/changed:  Yes      Coumadin Dose Comments:  one or more missed dose(s)      Abnormal Bleeding Symptoms:  No Any diet changes including alcohol intake, vegetables or greens since the last visit:  No Any illnesses or hospitalizations since the last visit:  No Any signs of clotting since the last visit (including chest discomfort, dizziness, shortness of breath, arm tingling, slurred speech, swelling or redness in leg):  No

## 2010-04-12 ENCOUNTER — Ambulatory Visit (INDEPENDENT_AMBULATORY_CARE_PROVIDER_SITE_OTHER): Payer: Managed Care, Other (non HMO)

## 2010-04-12 ENCOUNTER — Encounter: Payer: Self-pay | Admitting: Family Medicine

## 2010-04-12 DIAGNOSIS — Z7901 Long term (current) use of anticoagulants: Secondary | ICD-10-CM

## 2010-04-12 DIAGNOSIS — I2699 Other pulmonary embolism without acute cor pulmonale: Secondary | ICD-10-CM

## 2010-04-12 DIAGNOSIS — Z5181 Encounter for therapeutic drug level monitoring: Secondary | ICD-10-CM

## 2010-04-12 LAB — CONVERTED CEMR LAB: Prothrombin Time: 30.7 s

## 2010-04-16 NOTE — Medication Information (Addendum)
Summary: INR 2.6   PCP: [name] MD Indication 1: P.E. PT 30.7 INR RANGE 2.5-3.5           Allergies: 1)  ! Avelox 2)  Amoxicillin 3)  Lipitor  Anticoagulation Management History:      Negative risk factors for bleeding include an age less than 44 years old.  The bleeding index is 'low risk'.  Positive CHADS2 values include History of HTN.  Negative CHADS2 values include Age > 59 years old.  Her last INR was 1.1 and today's INR is 2.6.  Prothrombin time is 30.7.    Anticoagulation Management Assessment/Plan:      [name] current anticoagulation dose is Coumadin 7.5 mg tabs: as directed.  The next INR is due 4 weeks.        Laboratory Results   Blood Tests   Date/Time Recieved: April 12, 2010 4:38 PM  Date/Time Reported: April 12, 2010 4:38 PM   PT: 30.7 s   (Normal Range: 10.6-13.4)  INR: 2.6   (Normal Range: 0.88-1.12   Therap INR: 2.0-3.5)      ANTICOAGULATION RECORD PREVIOUS REGIMEN & LAB RESULTS Anticoagulation Diagnosis:  P.E. on  05/24/2009 Previous INR Goal Range:  2.5-3.5 on  05/24/2009 Previous INR:  1.1 on  04/05/2010 Previous Coumadin Dose(mg):   11.25mg  qd, 7.5mg  T,TH on  12/04/2009 Previous Regimen:  15mg  x 2 days then resume 11.25mg  qd, 7.5mg  T,TH on  04/05/2010 Previous Coagulation Comments:  missed 11.25mg  dose on  04/05/2010  NEW REGIMEN & LAB RESULTS Current INR: 2.6 Regimen: 11.25mg  qd, 7.5mg  T,TH Coagulation Comments: . Provider: tower      Repeat testing in: 4 weeks MEDICATIONS LYRICA 75 MG CAPS (PREGABALIN) Take 1 capsule by mouth once a day PAXIL 40 MG TABS (PAROXETINE HCL) Take 1by mouth once daily ASPIRIN 81 MG TBEC (ASPIRIN) Take one by mouth daily SYNTHROID 200 MCG  TABS (LEVOTHYROXINE SODIUM) take one by mouth daily SYNTHROID 25 MCG TABS (LEVOTHYROXINE SODIUM) 1 every am before breakfast COUMADIN 7.5 MG TABS (WARFARIN SODIUM) as directed PRILOSEC 20 MG CPDR (OMEPRAZOLE) 1 by mouth once daily in ams PLAQUENIL  200 MG TABS (HYDROXYCHLOROQUINE SULFATE) Take 3 tablet by mouth once a day BYSTOLIC 10 MG  TABS (NEBIVOLOL HCL) One tablet daily LASIX 20 MG TABS (FUROSEMIDE) Take 1 tablet by mouth once a day CLOBETASOL PROPIONATE 0.05 % OINT (CLOBETASOL PROPIONATE) as needed on ankles ALPRAZOLAM 0.5 MG TABS (ALPRAZOLAM) 1 by mouth up to two times a day as needed severe anxiety PREDNISONE 10 MG TABS (PREDNISONE) 1 by mouth qd PREDNISONE 1 MG TABS (PREDNISONE) 3 by mouth qd  Dose has been reviewed with patient or caretaker during this visit.  Reviewed by: twals  Anticoagulation Visit Questionnaire      Coumadin dose missed/changed:  No      Abnormal Bleeding Symptoms:  No Any diet changes including alcohol intake, vegetables or greens since the last visit:  No Any illnesses or hospitalizations since the last visit:  No Any signs of clotting since the last visit (including chest discomfort, dizziness, shortness of breath, arm tingling, slurred speech, swelling or redness in leg):  No

## 2010-04-18 LAB — PROTIME-INR
INR: 0.83 (ref 0.00–1.49)
INR: 0.87 (ref 0.00–1.49)
INR: 0.97 (ref 0.00–1.49)
INR: 0.98 (ref 0.00–1.49)
INR: 1.02 (ref 0.00–1.49)
INR: 1.15 (ref 0.00–1.49)
INR: 1.4 (ref 0.00–1.49)
INR: 1.51 — ABNORMAL HIGH (ref 0.00–1.49)
INR: 1.91 — ABNORMAL HIGH (ref 0.00–1.49)
Prothrombin Time: 11.6 seconds (ref 11.6–15.2)
Prothrombin Time: 12 seconds (ref 11.6–15.2)
Prothrombin Time: 13.1 seconds (ref 11.6–15.2)
Prothrombin Time: 13.2 seconds (ref 11.6–15.2)
Prothrombin Time: 13.6 seconds (ref 11.6–15.2)
Prothrombin Time: 14.9 seconds (ref 11.6–15.2)
Prothrombin Time: 17.4 seconds — ABNORMAL HIGH (ref 11.6–15.2)
Prothrombin Time: 18.4 seconds — ABNORMAL HIGH (ref 11.6–15.2)
Prothrombin Time: 22 seconds — ABNORMAL HIGH (ref 11.6–15.2)

## 2010-04-18 LAB — CBC
HCT: 33.6 % — ABNORMAL LOW (ref 36.0–46.0)
HCT: 35.1 % — ABNORMAL LOW (ref 36.0–46.0)
HCT: 35.7 % — ABNORMAL LOW (ref 36.0–46.0)
HCT: 35.9 % — ABNORMAL LOW (ref 36.0–46.0)
HCT: 35.9 % — ABNORMAL LOW (ref 36.0–46.0)
HCT: 36.1 % (ref 36.0–46.0)
HCT: 37.1 % (ref 36.0–46.0)
HCT: 37.3 % (ref 36.0–46.0)
HCT: 41.3 % (ref 36.0–46.0)
Hemoglobin: 10.3 g/dL — ABNORMAL LOW (ref 12.0–15.0)
Hemoglobin: 10.8 g/dL — ABNORMAL LOW (ref 12.0–15.0)
Hemoglobin: 10.9 g/dL — ABNORMAL LOW (ref 12.0–15.0)
Hemoglobin: 11 g/dL — ABNORMAL LOW (ref 12.0–15.0)
Hemoglobin: 11.1 g/dL — ABNORMAL LOW (ref 12.0–15.0)
Hemoglobin: 11.4 g/dL — ABNORMAL LOW (ref 12.0–15.0)
Hemoglobin: 11.4 g/dL — ABNORMAL LOW (ref 12.0–15.0)
Hemoglobin: 13.1 g/dL (ref 12.0–15.0)
MCH: 25.9 pg — ABNORMAL LOW (ref 26.0–34.0)
MCH: 26 pg (ref 26.0–34.0)
MCH: 26.2 pg (ref 26.0–34.0)
MCH: 26.3 pg (ref 26.0–34.0)
MCH: 26.4 pg (ref 26.0–34.0)
MCH: 26.5 pg (ref 26.0–34.0)
MCH: 26.7 pg (ref 26.0–34.0)
MCH: 27 pg (ref 26.0–34.0)
MCHC: 30.5 g/dL (ref 30.0–36.0)
MCHC: 30.5 g/dL (ref 30.0–36.0)
MCHC: 30.6 g/dL (ref 30.0–36.0)
MCHC: 30.7 g/dL (ref 30.0–36.0)
MCHC: 30.7 g/dL (ref 30.0–36.0)
MCHC: 30.8 g/dL (ref 30.0–36.0)
MCHC: 30.9 g/dL (ref 30.0–36.0)
MCHC: 31.7 g/dL (ref 30.0–36.0)
MCV: 84.9 fL (ref 78.0–100.0)
MCV: 85 fL (ref 78.0–100.0)
MCV: 85.1 fL (ref 78.0–100.0)
MCV: 85.1 fL (ref 78.0–100.0)
MCV: 85.2 fL (ref 78.0–100.0)
MCV: 85.2 fL (ref 78.0–100.0)
MCV: 86.1 fL (ref 78.0–100.0)
MCV: 86.2 fL (ref 78.0–100.0)
MCV: 87.4 fL (ref 78.0–100.0)
Platelets: 241 10*3/uL (ref 150–400)
Platelets: 264 10*3/uL (ref 150–400)
Platelets: 279 10*3/uL (ref 150–400)
Platelets: 285 10*3/uL (ref 150–400)
Platelets: 328 10*3/uL (ref 150–400)
Platelets: 340 10*3/uL (ref 150–400)
Platelets: 351 10*3/uL (ref 150–400)
Platelets: 359 10*3/uL (ref 150–400)
Platelets: 369 10*3/uL (ref 150–400)
RBC: 3.9 MIL/uL (ref 3.87–5.11)
RBC: 4.12 MIL/uL (ref 3.87–5.11)
RBC: 4.2 MIL/uL (ref 3.87–5.11)
RBC: 4.22 MIL/uL (ref 3.87–5.11)
RBC: 4.22 MIL/uL (ref 3.87–5.11)
RBC: 4.25 MIL/uL (ref 3.87–5.11)
RBC: 4.27 MIL/uL (ref 3.87–5.11)
RBC: 4.31 MIL/uL (ref 3.87–5.11)
RBC: 4.85 MIL/uL (ref 3.87–5.11)
RDW: 15.5 % (ref 11.5–15.5)
RDW: 15.6 % — ABNORMAL HIGH (ref 11.5–15.5)
RDW: 15.7 % — ABNORMAL HIGH (ref 11.5–15.5)
RDW: 15.7 % — ABNORMAL HIGH (ref 11.5–15.5)
RDW: 15.8 % — ABNORMAL HIGH (ref 11.5–15.5)
RDW: 16 % — ABNORMAL HIGH (ref 11.5–15.5)
RDW: 16 % — ABNORMAL HIGH (ref 11.5–15.5)
RDW: 16.3 % — ABNORMAL HIGH (ref 11.5–15.5)
WBC: 10.3 10*3/uL (ref 4.0–10.5)
WBC: 10.6 10*3/uL — ABNORMAL HIGH (ref 4.0–10.5)
WBC: 11.3 10*3/uL — ABNORMAL HIGH (ref 4.0–10.5)
WBC: 11.6 10*3/uL — ABNORMAL HIGH (ref 4.0–10.5)
WBC: 11.9 10*3/uL — ABNORMAL HIGH (ref 4.0–10.5)
WBC: 12.4 10*3/uL — ABNORMAL HIGH (ref 4.0–10.5)
WBC: 12.4 10*3/uL — ABNORMAL HIGH (ref 4.0–10.5)
WBC: 12.7 10*3/uL — ABNORMAL HIGH (ref 4.0–10.5)
WBC: 13.6 10*3/uL — ABNORMAL HIGH (ref 4.0–10.5)

## 2010-04-18 LAB — TYPE AND SCREEN
ABO/RH(D): A POS
Antibody Screen: NEGATIVE

## 2010-04-18 LAB — COMPREHENSIVE METABOLIC PANEL
ALT: 22 U/L (ref 0–35)
ALT: 33 U/L (ref 0–35)
AST: 13 U/L (ref 0–37)
AST: 25 U/L (ref 0–37)
Albumin: 2.3 g/dL — ABNORMAL LOW (ref 3.5–5.2)
Albumin: 3.2 g/dL — ABNORMAL LOW (ref 3.5–5.2)
Alkaline Phosphatase: 56 U/L (ref 39–117)
Alkaline Phosphatase: 72 U/L (ref 39–117)
BUN: 10 mg/dL (ref 6–23)
BUN: 9 mg/dL (ref 6–23)
CO2: 25 mEq/L (ref 19–32)
CO2: 30 mEq/L (ref 19–32)
Calcium: 8.5 mg/dL (ref 8.4–10.5)
Calcium: 8.9 mg/dL (ref 8.4–10.5)
Chloride: 104 mEq/L (ref 96–112)
Chloride: 104 mEq/L (ref 96–112)
Creatinine, Ser: 0.86 mg/dL (ref 0.4–1.2)
Creatinine, Ser: 0.92 mg/dL (ref 0.4–1.2)
GFR calc Af Amer: >60 mL/min (ref >60)
GFR calc Af Amer: >60 mL/min (ref >60)
GFR calc non Af Amer: >60 mL/min (ref >60)
GFR calc non Af Amer: >60 mL/min (ref >60)
Glucose, Bld: 103 mg/dL — ABNORMAL HIGH (ref 70–99)
Glucose, Bld: 150 mg/dL — ABNORMAL HIGH (ref 70–99)
Potassium: 4.5 mEq/L (ref 3.5–5.1)
Potassium: 5 mEq/L (ref 3.5–5.1)
Sodium: 135 mEq/L (ref 135–145)
Sodium: 138 mEq/L (ref 135–145)
Total Bilirubin: 0.3 mg/dL (ref 0.3–1.2)
Total Bilirubin: 0.4 mg/dL (ref 0.3–1.2)
Total Protein: 5.8 g/dL — ABNORMAL LOW (ref 6.0–8.3)
Total Protein: 6.3 g/dL (ref 6.0–8.3)

## 2010-04-18 LAB — BASIC METABOLIC PANEL
BUN: 13 mg/dL (ref 6–23)
CO2: 28 mEq/L (ref 19–32)
Calcium: 8 mg/dL — ABNORMAL LOW (ref 8.4–10.5)
Chloride: 104 mEq/L (ref 96–112)
Creatinine, Ser: 0.8 mg/dL (ref 0.4–1.2)
GFR calc Af Amer: >60 mL/min (ref >60)
GFR calc non Af Amer: >60 mL/min (ref >60)
Glucose, Bld: 112 mg/dL — ABNORMAL HIGH (ref 70–99)
Potassium: 4.3 mEq/L (ref 3.5–5.1)
Sodium: 135 mEq/L (ref 135–145)

## 2010-04-18 LAB — GLUCOSE, CAPILLARY
Glucose-Capillary: 102 mg/dL — ABNORMAL HIGH (ref 70–99)
Glucose-Capillary: 103 mg/dL — ABNORMAL HIGH (ref 70–99)
Glucose-Capillary: 103 mg/dL — ABNORMAL HIGH (ref 70–99)
Glucose-Capillary: 104 mg/dL — ABNORMAL HIGH (ref 70–99)
Glucose-Capillary: 105 mg/dL — ABNORMAL HIGH (ref 70–99)
Glucose-Capillary: 111 mg/dL — ABNORMAL HIGH (ref 70–99)
Glucose-Capillary: 114 mg/dL — ABNORMAL HIGH (ref 70–99)
Glucose-Capillary: 115 mg/dL — ABNORMAL HIGH (ref 70–99)
Glucose-Capillary: 118 mg/dL — ABNORMAL HIGH (ref 70–99)
Glucose-Capillary: 120 mg/dL — ABNORMAL HIGH (ref 70–99)
Glucose-Capillary: 124 mg/dL — ABNORMAL HIGH (ref 70–99)
Glucose-Capillary: 124 mg/dL — ABNORMAL HIGH (ref 70–99)
Glucose-Capillary: 124 mg/dL — ABNORMAL HIGH (ref 70–99)
Glucose-Capillary: 138 mg/dL — ABNORMAL HIGH (ref 70–99)
Glucose-Capillary: 138 mg/dL — ABNORMAL HIGH (ref 70–99)
Glucose-Capillary: 149 mg/dL — ABNORMAL HIGH (ref 70–99)
Glucose-Capillary: 155 mg/dL — ABNORMAL HIGH (ref 70–99)
Glucose-Capillary: 157 mg/dL — ABNORMAL HIGH (ref 70–99)
Glucose-Capillary: 160 mg/dL — ABNORMAL HIGH (ref 70–99)
Glucose-Capillary: 165 mg/dL — ABNORMAL HIGH (ref 70–99)
Glucose-Capillary: 168 mg/dL — ABNORMAL HIGH (ref 70–99)
Glucose-Capillary: 170 mg/dL — ABNORMAL HIGH (ref 70–99)
Glucose-Capillary: 175 mg/dL — ABNORMAL HIGH (ref 70–99)
Glucose-Capillary: 176 mg/dL — ABNORMAL HIGH (ref 70–99)
Glucose-Capillary: 206 mg/dL — ABNORMAL HIGH (ref 70–99)
Glucose-Capillary: 207 mg/dL — ABNORMAL HIGH (ref 70–99)
Glucose-Capillary: 236 mg/dL — ABNORMAL HIGH (ref 70–99)
Glucose-Capillary: 259 mg/dL — ABNORMAL HIGH (ref 70–99)
Glucose-Capillary: 89 mg/dL (ref 70–99)
Glucose-Capillary: 94 mg/dL (ref 70–99)
Glucose-Capillary: 95 mg/dL (ref 70–99)
Glucose-Capillary: 98 mg/dL (ref 70–99)
Glucose-Capillary: >600 mg/dL (ref 70–99)

## 2010-04-18 LAB — URINALYSIS, ROUTINE W REFLEX MICROSCOPIC
Bilirubin Urine: NEGATIVE
Glucose, UA: NEGATIVE mg/dL
Hgb urine dipstick: NEGATIVE
Ketones, ur: NEGATIVE mg/dL
Leukocytes, UA: NEGATIVE
Nitrite: NEGATIVE
Protein, ur: 30 mg/dL — AB
Specific Gravity, Urine: 1.015 (ref 1.005–1.030)
Urobilinogen, UA: 0.2 mg/dL (ref 0.0–1.0)
pH: 6.5 (ref 5.0–8.0)

## 2010-04-18 LAB — URINE MICROSCOPIC-ADD ON

## 2010-04-18 LAB — POCT I-STAT 3, ART BLOOD GAS (G3+)
Acid-Base Excess: 1 mmol/L (ref 0.0–2.0)
Acid-Base Excess: 3 mmol/L — ABNORMAL HIGH (ref 0.0–2.0)
Bicarbonate: 28.6 mEq/L — ABNORMAL HIGH (ref 20.0–24.0)
Bicarbonate: 28.8 mEq/L — ABNORMAL HIGH (ref 20.0–24.0)
O2 Saturation: 89 %
O2 Saturation: 92 %
Patient temperature: 97.8
Patient temperature: 98.6
TCO2: 30 mmol/L (ref 0–100)
TCO2: 30 mmol/L (ref 0–100)
pCO2 arterial: 50.2 mmHg — ABNORMAL HIGH (ref 35.0–45.0)
pCO2 arterial: 55.2 mmHg — ABNORMAL HIGH (ref 35.0–45.0)
pH, Arterial: 7.32 — ABNORMAL LOW (ref 7.350–7.400)
pH, Arterial: 7.366 (ref 7.350–7.400)
pO2, Arterial: 61 mmHg — ABNORMAL LOW (ref 80.0–100.0)
pO2, Arterial: 68 mmHg — ABNORMAL LOW (ref 80.0–100.0)

## 2010-04-18 LAB — BODY FLUID CULTURE: Culture: NO GROWTH

## 2010-04-18 LAB — ANAEROBIC CULTURE

## 2010-04-18 LAB — APTT: aPTT: 27 seconds (ref 24–37)

## 2010-04-18 LAB — BLOOD GAS, ARTERIAL
Acid-Base Excess: 3.1 mmol/L — ABNORMAL HIGH (ref 0.0–2.0)
Bicarbonate: 27.6 mEq/L — ABNORMAL HIGH (ref 20.0–24.0)
Drawn by: 206361
O2 Saturation: 94.6 %
Patient temperature: 98.6
TCO2: 29.1 mmol/L (ref 0–100)
pCO2 arterial: 46.3 mmHg — ABNORMAL HIGH (ref 35.0–45.0)
pH, Arterial: 7.394 (ref 7.350–7.400)
pO2, Arterial: 71.9 mmHg — ABNORMAL LOW (ref 80.0–100.0)

## 2010-04-18 LAB — HEPARIN LEVEL (UNFRACTIONATED)
Heparin Unfractionated: 0.24 IU/mL — ABNORMAL LOW (ref 0.30–0.70)
Heparin Unfractionated: 0.29 IU/mL — ABNORMAL LOW (ref 0.30–0.70)
Heparin Unfractionated: 0.3 IU/mL (ref 0.30–0.70)
Heparin Unfractionated: 0.31 IU/mL (ref 0.30–0.70)
Heparin Unfractionated: 0.39 IU/mL (ref 0.30–0.70)
Heparin Unfractionated: 0.4 IU/mL (ref 0.30–0.70)
Heparin Unfractionated: 0.45 IU/mL (ref 0.30–0.70)
Heparin Unfractionated: 0.46 IU/mL (ref 0.30–0.70)
Heparin Unfractionated: 0.53 IU/mL (ref 0.30–0.70)
Heparin Unfractionated: 0.64 IU/mL (ref 0.30–0.70)

## 2010-04-18 LAB — SURGICAL PCR SCREEN
MRSA, PCR: NEGATIVE
Staphylococcus aureus: POSITIVE — AB

## 2010-04-20 LAB — BASIC METABOLIC PANEL
BUN: 9 mg/dL (ref 6–23)
Chloride: 104 mEq/L (ref 96–112)
GFR calc Af Amer: >60 mL/min (ref >60)
Potassium: 4.5 mEq/L (ref 3.5–5.1)
Sodium: 141 mEq/L (ref 135–145)

## 2010-04-20 LAB — CBC
HCT: 37.7 % (ref 36.0–46.0)
Hemoglobin: 12.3 g/dL (ref 12.0–15.0)
MCV: 81 fL (ref 78.0–100.0)
RBC: 4.65 MIL/uL (ref 3.87–5.11)
WBC: 5.2 10*3/uL (ref 4.0–10.5)

## 2010-04-20 LAB — DIFFERENTIAL
Eosinophils Relative: 3 % (ref 0–5)
Lymphocytes Relative: 17 % (ref 12–46)
Lymphs Abs: 0.9 10*3/uL (ref 0.7–4.0)
Monocytes Absolute: 0.4 10*3/uL (ref 0.1–1.0)
Monocytes Relative: 8 % (ref 3–12)

## 2010-04-21 LAB — AFB CULTURE WITH SMEAR (NOT AT ARMC): Acid Fast Smear: NONE SEEN

## 2010-04-21 LAB — PROTIME-INR
Prothrombin Time: 14.3 seconds (ref 11.6–15.2)
Prothrombin Time: 11.4 seconds — ABNORMAL LOW (ref 11.6–15.2)

## 2010-04-21 LAB — CBC
HCT: 38.6 % (ref 36.0–46.0)
Hemoglobin: 12.2 g/dL (ref 12.0–15.0)
MCH: 25.9 pg — ABNORMAL LOW (ref 26.0–34.0)
MCHC: 31.6 g/dL (ref 30.0–36.0)
MCHC: 33.9 g/dL (ref 30.0–36.0)
MCV: 82.1 fL (ref 78.0–100.0)
Platelets: 201 10*3/uL (ref 150–400)
RBC: 4.71 MIL/uL (ref 3.87–5.11)
WBC: 5.5 10*3/uL (ref 4.0–10.5)

## 2010-04-21 LAB — DIFFERENTIAL
Basophils Absolute: 0 10*3/uL (ref 0.0–0.1)
Lymphocytes Relative: 11 % — ABNORMAL LOW (ref 12–46)
Lymphs Abs: 0.7 10*3/uL (ref 0.7–4.0)
Monocytes Absolute: 0.3 10*3/uL (ref 0.1–1.0)
Neutro Abs: 5.2 10*3/uL (ref 1.7–7.7)

## 2010-04-21 LAB — BASIC METABOLIC PANEL
CO2: 32 mEq/L (ref 19–32)
GFR calc Af Amer: >60 mL/min (ref >60)
GFR calc non Af Amer: >60 mL/min (ref >60)
Glucose, Bld: 185 mg/dL — ABNORMAL HIGH (ref 70–99)
Potassium: 4.2 mEq/L (ref 3.5–5.1)
Sodium: 141 mEq/L (ref 135–145)

## 2010-04-21 LAB — POCT CARDIAC MARKERS: Myoglobin, poc: 37.2 ng/mL (ref 12–200)

## 2010-04-21 LAB — TISSUE CULTURE

## 2010-04-23 LAB — POCT I-STAT, CHEM 8
BUN: 11 mg/dL (ref 6–23)
Calcium, Ion: 1.13 mmol/L (ref 1.12–1.32)
Creatinine, Ser: 0.8 mg/dL (ref 0.4–1.2)
Glucose, Bld: 69 mg/dL — ABNORMAL LOW (ref 70–99)
TCO2: 30 mmol/L (ref 0–100)

## 2010-04-23 LAB — DIFFERENTIAL
Eosinophils Absolute: 0.2 10*3/uL (ref 0.0–0.7)
Eosinophils Relative: 4 % (ref 0–5)
Lymphs Abs: 1.1 10*3/uL (ref 0.7–4.0)
Monocytes Relative: 10 % (ref 3–12)
Neutrophils Relative %: 63 % (ref 43–77)

## 2010-04-23 LAB — URINALYSIS, ROUTINE W REFLEX MICROSCOPIC
Glucose, UA: NEGATIVE mg/dL
Protein, ur: 100 mg/dL — AB

## 2010-04-23 LAB — CBC
HCT: 38.4 % (ref 36.0–46.0)
MCV: 83.2 fL (ref 78.0–100.0)
RBC: 4.61 MIL/uL (ref 3.87–5.11)
WBC: 4.8 10*3/uL (ref 4.0–10.5)

## 2010-04-23 LAB — URINE MICROSCOPIC-ADD ON

## 2010-04-23 LAB — URINE CULTURE

## 2010-04-23 LAB — POCT PREGNANCY, URINE: Preg Test, Ur: NEGATIVE

## 2010-04-23 NOTE — Letter (Signed)
Summary: [facility]   Imported By: Maryln Gottron 04/19/2010 08:25:44  _____________________________________________________________________  External Attachment:    Type:   Image     Comment:   External Document

## 2010-04-23 NOTE — Letter (Signed)
Summary: Olin Pia MD/[facility]  Olin Pia MD/[facility]   Imported By: Lester Georgetown 04/18/2010 11:00:22  _____________________________________________________________________  External Attachment:    Type:   Image     Comment:   External Document

## 2010-04-24 LAB — URINE CULTURE: Colony Count: NO GROWTH

## 2010-04-24 LAB — CBC
HCT: 39.2 % (ref 36.0–46.0)
MCHC: 32.8 g/dL (ref 30.0–36.0)
MCHC: 32.5 g/dL (ref 30.0–36.0)
Platelets: 203 10*3/uL (ref 150–400)
Platelets: 212 10*3/uL (ref 150–400)
RDW: 14.9 % (ref 11.5–15.5)
RDW: 14.3 % (ref 11.5–15.5)

## 2010-04-24 LAB — POCT CARDIAC MARKERS
CKMB, poc: <1 ng/mL — ABNORMAL LOW (ref 1.0–8.0)
Myoglobin, poc: 63.3 ng/mL (ref 12–200)
Troponin i, poc: <0.05 ng/mL (ref 0.00–0.09)

## 2010-04-24 LAB — DIFFERENTIAL
Basophils Relative: 1 % (ref 0–1)
Lymphs Abs: 0.9 10*3/uL (ref 0.7–4.0)
Monocytes Absolute: 0.4 10*3/uL (ref 0.1–1.0)
Monocytes Relative: 7 % (ref 3–12)
Neutro Abs: 4.3 10*3/uL (ref 1.7–7.7)
Neutrophils Relative %: 72 % (ref 43–77)

## 2010-04-24 LAB — PROTIME-INR
INR: 0.96 (ref 0.00–1.49)
INR: 0.91 (ref 0.00–1.49)
Prothrombin Time: 12.2 seconds (ref 11.6–15.2)

## 2010-04-24 LAB — COMPREHENSIVE METABOLIC PANEL
Albumin: 3.6 g/dL (ref 3.5–5.2)
Alkaline Phosphatase: 98 U/L (ref 39–117)
BUN: 10 mg/dL (ref 6–23)
Calcium: 8.6 mg/dL (ref 8.4–10.5)
Potassium: 4.4 mEq/L (ref 3.5–5.1)
Sodium: 140 mEq/L (ref 135–145)
Total Protein: 7.8 g/dL (ref 6.0–8.3)

## 2010-04-24 LAB — URINE MICROSCOPIC-ADD ON

## 2010-04-24 LAB — URINALYSIS, ROUTINE W REFLEX MICROSCOPIC
Glucose, UA: NEGATIVE mg/dL
Protein, ur: 30 mg/dL — AB
Specific Gravity, Urine: 1.016 (ref 1.005–1.030)

## 2010-04-24 LAB — BASIC METABOLIC PANEL
BUN: 10 mg/dL (ref 6–23)
CO2: 28 mEq/L (ref 19–32)
Calcium: 8.3 mg/dL — ABNORMAL LOW (ref 8.4–10.5)
Creatinine, Ser: 0.8 mg/dL (ref 0.4–1.2)
GFR calc non Af Amer: >60 mL/min (ref >60)
Glucose, Bld: 124 mg/dL — ABNORMAL HIGH (ref 70–99)

## 2010-04-24 LAB — LIPID PANEL
HDL: 27 mg/dL — ABNORMAL LOW (ref >39)
Total CHOL/HDL Ratio: 9 RATIO
VLDL: 25 mg/dL (ref 0–40)

## 2010-04-24 LAB — APTT
aPTT: 32 seconds (ref 24–37)
aPTT: 23 seconds — ABNORMAL LOW (ref 24–37)

## 2010-04-24 LAB — POCT B-TYPE NATRIURETIC PEPTIDE (BNP): B Natriuretic Peptide, POC: <5 pg/mL (ref 0–100)

## 2010-04-24 LAB — HEMOGLOBIN A1C
Hgb A1c MFr Bld: 6 % — ABNORMAL HIGH (ref <5.7)
Mean Plasma Glucose: 126 mg/dL — ABNORMAL HIGH (ref <117)

## 2010-04-29 LAB — BASIC METABOLIC PANEL
CO2: 32 mEq/L (ref 19–32)
Chloride: 105 mEq/L (ref 96–112)
Creatinine, Ser: 0.8 mg/dL (ref 0.4–1.2)
GFR calc Af Amer: >60 mL/min (ref >60)
Glucose, Bld: 75 mg/dL (ref 70–99)
Sodium: 145 mEq/L (ref 135–145)

## 2010-04-29 LAB — URINALYSIS, ROUTINE W REFLEX MICROSCOPIC
Bilirubin Urine: NEGATIVE
Glucose, UA: NEGATIVE mg/dL
Hgb urine dipstick: NEGATIVE
Protein, ur: NEGATIVE mg/dL
Specific Gravity, Urine: 1.037 — ABNORMAL HIGH (ref 1.005–1.030)

## 2010-04-29 LAB — D-DIMER, QUANTITATIVE: D-Dimer, Quant: 1.86 ug/mL-FEU — ABNORMAL HIGH (ref 0.00–0.48)

## 2010-04-29 LAB — CBC
Hemoglobin: 13.4 g/dL (ref 12.0–15.0)
MCHC: 33.3 g/dL (ref 30.0–36.0)
MCV: 83.6 fL (ref 78.0–100.0)
RBC: 4.82 MIL/uL (ref 3.87–5.11)

## 2010-04-29 LAB — DIFFERENTIAL
Basophils Absolute: 0 10*3/uL (ref 0.0–0.1)
Basophils Relative: 1 % (ref 0–1)
Lymphocytes Relative: 18 % (ref 12–46)
Monocytes Absolute: 0.4 10*3/uL (ref 0.1–1.0)
Neutro Abs: 4.2 10*3/uL (ref 1.7–7.7)
Neutrophils Relative %: 70 % (ref 43–77)

## 2010-04-29 LAB — POCT TOXICOLOGY PANEL

## 2010-04-29 LAB — POCT CARDIAC MARKERS
CKMB, poc: 1.2 ng/mL (ref 1.0–8.0)
Myoglobin, poc: 58.1 ng/mL (ref 12–200)

## 2010-05-01 ENCOUNTER — Other Ambulatory Visit: Payer: Self-pay | Admitting: Internal Medicine

## 2010-05-09 ENCOUNTER — Ambulatory Visit: Payer: Managed Care, Other (non HMO)

## 2010-05-09 ENCOUNTER — Telehealth: Payer: Self-pay | Admitting: *Deleted

## 2010-05-09 DIAGNOSIS — I2699 Other pulmonary embolism without acute cor pulmonale: Secondary | ICD-10-CM

## 2010-05-09 DIAGNOSIS — Z5181 Encounter for therapeutic drug level monitoring: Secondary | ICD-10-CM

## 2010-05-09 DIAGNOSIS — Z7901 Long term (current) use of anticoagulants: Secondary | ICD-10-CM

## 2010-05-09 NOTE — Telephone Encounter (Signed)
INR monitoring enrollment  

## 2010-05-14 ENCOUNTER — Telehealth: Payer: Self-pay | Admitting: Radiology

## 2010-05-14 ENCOUNTER — Ambulatory Visit (INDEPENDENT_AMBULATORY_CARE_PROVIDER_SITE_OTHER): Payer: Managed Care, Other (non HMO) | Admitting: Family Medicine

## 2010-05-14 DIAGNOSIS — Z7901 Long term (current) use of anticoagulants: Secondary | ICD-10-CM

## 2010-05-14 DIAGNOSIS — Z5181 Encounter for therapeutic drug level monitoring: Secondary | ICD-10-CM

## 2010-05-14 DIAGNOSIS — I2699 Other pulmonary embolism without acute cor pulmonale: Secondary | ICD-10-CM

## 2010-05-14 LAB — POCT INR: INR: 1.7

## 2010-05-14 NOTE — Patient Instructions (Signed)
11.25 mg daily, 7.5 mg Thur, recheck 2 weeks

## 2010-05-14 NOTE — Telephone Encounter (Signed)
Order for INR

## 2010-05-15 LAB — POCT I-STAT 3, ART BLOOD GAS (G3+)
Acid-Base Excess: 2 mmol/L (ref 0.0–2.0)
Bicarbonate: 27.8 mEq/L — ABNORMAL HIGH (ref 20.0–24.0)
O2 Saturation: 84 %
Patient temperature: 98.2
TCO2: 29 mmol/L (ref 0–100)
pCO2 arterial: 48.4 mmHg — ABNORMAL HIGH (ref 35.0–45.0)
pH, Arterial: 7.366 (ref 7.350–7.400)
pO2, Arterial: 51 mmHg — ABNORMAL LOW (ref 80.0–100.0)

## 2010-05-15 LAB — COMPREHENSIVE METABOLIC PANEL
ALT: 19 U/L (ref 0–35)
ALT: 26 U/L (ref 0–35)
AST: 15 U/L (ref 0–37)
AST: 25 U/L (ref 0–37)
Albumin: 2.7 g/dL — ABNORMAL LOW (ref 3.5–5.2)
Albumin: 3.2 g/dL — ABNORMAL LOW (ref 3.5–5.2)
Alkaline Phosphatase: 100 U/L (ref 39–117)
Alkaline Phosphatase: 86 U/L (ref 39–117)
BUN: 6 mg/dL (ref 6–23)
BUN: 8 mg/dL (ref 6–23)
CO2: 19 mEq/L (ref 19–32)
CO2: 27 mEq/L (ref 19–32)
Calcium: 8.3 mg/dL — ABNORMAL LOW (ref 8.4–10.5)
Calcium: 9.8 mg/dL (ref 8.4–10.5)
Chloride: 105 mEq/L (ref 96–112)
Chloride: 99 mEq/L (ref 96–112)
Creatinine, Ser: 0.54 mg/dL (ref 0.4–1.2)
Creatinine, Ser: 0.57 mg/dL (ref 0.4–1.2)
GFR calc Af Amer: >60 mL/min (ref >60)
GFR calc Af Amer: >60 mL/min (ref >60)
GFR calc non Af Amer: >60 mL/min (ref >60)
GFR calc non Af Amer: >60 mL/min (ref >60)
Glucose, Bld: 103 mg/dL — ABNORMAL HIGH (ref 70–99)
Glucose, Bld: 130 mg/dL — ABNORMAL HIGH (ref 70–99)
Potassium: 4.3 mEq/L (ref 3.5–5.1)
Potassium: 4.6 mEq/L (ref 3.5–5.1)
Sodium: 133 mEq/L — ABNORMAL LOW (ref 135–145)
Sodium: 136 mEq/L (ref 135–145)
Total Bilirubin: 0.4 mg/dL (ref 0.3–1.2)
Total Bilirubin: 0.5 mg/dL (ref 0.3–1.2)
Total Protein: 6.5 g/dL (ref 6.0–8.3)
Total Protein: 6.7 g/dL (ref 6.0–8.3)

## 2010-05-15 LAB — CBC
HCT: 31.2 % — ABNORMAL LOW (ref 36.0–46.0)
HCT: 31.6 % — ABNORMAL LOW (ref 36.0–46.0)
HCT: 36.1 % (ref 36.0–46.0)
Hemoglobin: 10.6 g/dL — ABNORMAL LOW (ref 12.0–15.0)
Hemoglobin: 10.8 g/dL — ABNORMAL LOW (ref 12.0–15.0)
Hemoglobin: 12.2 g/dL (ref 12.0–15.0)
MCHC: 33.6 g/dL (ref 30.0–36.0)
MCHC: 33.9 g/dL (ref 30.0–36.0)
MCHC: 34.6 g/dL (ref 30.0–36.0)
MCV: 77.4 fL — ABNORMAL LOW (ref 78.0–100.0)
MCV: 78 fL (ref 78.0–100.0)
MCV: 78.5 fL (ref 78.0–100.0)
Platelets: 190 10*3/uL (ref 150–400)
Platelets: 211 10*3/uL (ref 150–400)
Platelets: 261 10*3/uL (ref 150–400)
RBC: 4.03 MIL/uL (ref 3.87–5.11)
RBC: 4.03 MIL/uL (ref 3.87–5.11)
RBC: 4.63 MIL/uL (ref 3.87–5.11)
RDW: 16 % — ABNORMAL HIGH (ref 11.5–15.5)
RDW: 16.2 % — ABNORMAL HIGH (ref 11.5–15.5)
RDW: 16.5 % — ABNORMAL HIGH (ref 11.5–15.5)
WBC: 5.5 10*3/uL (ref 4.0–10.5)
WBC: 5.8 10*3/uL (ref 4.0–10.5)
WBC: 6.2 10*3/uL (ref 4.0–10.5)

## 2010-05-15 LAB — TISSUE CULTURE
Culture: NO GROWTH
Gram Stain: NONE SEEN

## 2010-05-15 LAB — BASIC METABOLIC PANEL
BUN: 9 mg/dL (ref 6–23)
CO2: 28 mEq/L (ref 19–32)
CO2: 29 mEq/L (ref 19–32)
Calcium: 8.2 mg/dL — ABNORMAL LOW (ref 8.4–10.5)
Chloride: 100 mEq/L (ref 96–112)
Chloride: 102 mEq/L (ref 96–112)
Creatinine, Ser: 0.6 mg/dL (ref 0.4–1.2)
GFR calc Af Amer: >60 mL/min (ref >60)
GFR calc Af Amer: >60 mL/min (ref >60)
GFR calc non Af Amer: >60 mL/min (ref >60)
Glucose, Bld: 64 mg/dL — ABNORMAL LOW (ref 70–99)
Potassium: 4.2 mEq/L (ref 3.5–5.1)
Sodium: 130 mEq/L — ABNORMAL LOW (ref 135–145)
Sodium: 137 mEq/L (ref 135–145)

## 2010-05-15 LAB — BLOOD GAS, ARTERIAL
Acid-Base Excess: 1.7 mmol/L (ref 0.0–2.0)
Acid-base deficit: 0.4 mmol/L (ref 0.0–2.0)
Bicarbonate: 25.4 mEq/L — ABNORMAL HIGH (ref 20.0–24.0)
Bicarbonate: 26.1 mEq/L — ABNORMAL HIGH (ref 20.0–24.0)
Drawn by: 181601
FIO2: 0.21 %
O2 Content: 6 L/min
O2 Saturation: 96.7 %
O2 Saturation: 99.6 %
Patient temperature: 98.6
Patient temperature: 98.6
TCO2: 27.1 mmol/L (ref 0–100)
TCO2: 27.4 mmol/L (ref 0–100)
pCO2 arterial: 43.3 mmHg (ref 35.0–45.0)
pCO2 arterial: 54.3 mmHg — ABNORMAL HIGH (ref 35.0–45.0)
pH, Arterial: 7.291 — ABNORMAL LOW (ref 7.350–7.400)
pH, Arterial: 7.397 (ref 7.350–7.400)
pO2, Arterial: 169 mmHg — ABNORMAL HIGH (ref 80.0–100.0)
pO2, Arterial: 82.8 mmHg (ref 80.0–100.0)

## 2010-05-15 LAB — CROSSMATCH
ABO/RH(D): A POS
Antibody Screen: NEGATIVE

## 2010-05-15 LAB — URINALYSIS, ROUTINE W REFLEX MICROSCOPIC
Bilirubin Urine: NEGATIVE
Glucose, UA: NEGATIVE mg/dL
Hgb urine dipstick: NEGATIVE
Ketones, ur: NEGATIVE mg/dL
Nitrite: NEGATIVE
Protein, ur: NEGATIVE mg/dL
Specific Gravity, Urine: 1.01 (ref 1.005–1.030)
Urobilinogen, UA: 0.2 mg/dL (ref 0.0–1.0)
pH: 6 (ref 5.0–8.0)

## 2010-05-15 LAB — ANAEROBIC CULTURE: Gram Stain: NONE SEEN

## 2010-05-15 LAB — GLUCOSE, CAPILLARY: Glucose-Capillary: 103 mg/dL — ABNORMAL HIGH (ref 70–99)

## 2010-05-15 LAB — BODY FLUID CULTURE
Culture: NO GROWTH
Gram Stain: NONE SEEN

## 2010-05-15 LAB — PROTIME-INR
INR: 0.9 (ref 0.00–1.49)
Prothrombin Time: 12.4 seconds (ref 11.6–15.2)

## 2010-05-15 LAB — APTT: aPTT: 23 seconds — ABNORMAL LOW (ref 24–37)

## 2010-05-15 LAB — ABO/RH: ABO/RH(D): A POS

## 2010-05-16 LAB — POCT CARDIAC MARKERS
CKMB, poc: <1 ng/mL — ABNORMAL LOW (ref 1.0–8.0)
CKMB, poc: <1 ng/mL — ABNORMAL LOW (ref 1.0–8.0)
Myoglobin, poc: 45.1 ng/mL (ref 12–200)
Myoglobin, poc: 70.2 ng/mL (ref 12–200)
Myoglobin, poc: 85.6 ng/mL (ref 12–200)
Troponin i, poc: <0.05 ng/mL (ref 0.00–0.09)

## 2010-05-16 LAB — PROTHROMBIN GENE MUTATION

## 2010-05-16 LAB — HOMOCYSTEINE: Homocysteine: 10.1 umol/L (ref 4.0–15.4)

## 2010-05-16 LAB — LUPUS ANTICOAGULANT PANEL
DRVVT: 34.7 secs — ABNORMAL LOW (ref 36.1–47.0)
PTT Lupus Anticoagulant: 34.3 secs — ABNORMAL LOW (ref 36.3–48.8)

## 2010-05-16 LAB — DIFFERENTIAL
Basophils Absolute: 0 10*3/uL (ref 0.0–0.1)
Basophils Relative: 0 % (ref 0–1)
Eosinophils Absolute: 0.2 10*3/uL (ref 0.0–0.7)
Eosinophils Absolute: 0.4 10*3/uL (ref 0.0–0.7)
Eosinophils Relative: 3 % (ref 0–5)
Lymphocytes Relative: 21 % (ref 12–46)
Lymphs Abs: 1 10*3/uL (ref 0.7–4.0)
Metamyelocytes Relative: 0 %
Monocytes Relative: 7 % (ref 3–12)
Myelocytes: 0 %
Neutro Abs: 4.8 10*3/uL (ref 1.7–7.7)
Neutrophils Relative %: 63 % (ref 43–77)

## 2010-05-16 LAB — CARDIOLIPIN ANTIBODIES, IGG, IGM, IGA
Anticardiolipin IgG: 8 [GPL'U] — ABNORMAL LOW (ref <11)
Anticardiolipin IgM: <7 [MPL'U] — ABNORMAL LOW (ref <10)

## 2010-05-16 LAB — CBC
HCT: 37.5 % (ref 36.0–46.0)
HCT: 33.5 % — ABNORMAL LOW (ref 36.0–46.0)
Hemoglobin: 13 g/dL (ref 12.0–15.0)
MCV: 76.3 fL — ABNORMAL LOW (ref 78.0–100.0)
MCV: 77.4 fL — ABNORMAL LOW (ref 78.0–100.0)
MCV: 77.9 fL — ABNORMAL LOW (ref 78.0–100.0)
RBC: 4.91 MIL/uL (ref 3.87–5.11)
RBC: 4.33 MIL/uL (ref 3.87–5.11)
RBC: 4.88 MIL/uL (ref 3.87–5.11)
WBC: 7.3 10*3/uL (ref 4.0–10.5)
WBC: 3.8 10*3/uL — ABNORMAL LOW (ref 4.0–10.5)
WBC: 5.1 10*3/uL (ref 4.0–10.5)

## 2010-05-16 LAB — CULTURE, BLOOD (ROUTINE X 2): Culture: NO GROWTH

## 2010-05-16 LAB — POCT I-STAT, CHEM 8
BUN: 10 mg/dL (ref 6–23)
Chloride: 103 mEq/L (ref 96–112)
Creatinine, Ser: 1 mg/dL (ref 0.4–1.2)
Glucose, Bld: 84 mg/dL (ref 70–99)
Potassium: 4.9 mEq/L (ref 3.5–5.1)

## 2010-05-16 LAB — FACTOR 5 LEIDEN

## 2010-05-16 LAB — BASIC METABOLIC PANEL
Calcium: 8.4 mg/dL (ref 8.4–10.5)
Creatinine, Ser: 0.84 mg/dL (ref 0.4–1.2)
GFR calc Af Amer: >60 mL/min (ref >60)
GFR calc non Af Amer: >60 mL/min (ref >60)
Glucose, Bld: 103 mg/dL — ABNORMAL HIGH (ref 70–99)
Sodium: 138 mEq/L (ref 135–145)

## 2010-05-16 LAB — BODY FLUID CULTURE: Culture: NO GROWTH

## 2010-05-16 LAB — ANAEROBIC CULTURE

## 2010-05-16 LAB — PROTEIN C ACTIVITY: Protein C Activity: 109 % (ref 75–133)

## 2010-05-16 LAB — PROTEIN C, TOTAL: Protein C, Total: 73 % (ref 70–140)

## 2010-05-16 LAB — PROTEIN S ACTIVITY: Protein S Activity: 72 % (ref 69–129)

## 2010-05-28 ENCOUNTER — Ambulatory Visit: Payer: Managed Care, Other (non HMO)

## 2010-05-31 ENCOUNTER — Ambulatory Visit: Payer: Managed Care, Other (non HMO)

## 2010-06-14 ENCOUNTER — Other Ambulatory Visit (INDEPENDENT_AMBULATORY_CARE_PROVIDER_SITE_OTHER): Payer: Managed Care, Other (non HMO) | Admitting: Family Medicine

## 2010-06-14 DIAGNOSIS — Z5181 Encounter for therapeutic drug level monitoring: Secondary | ICD-10-CM

## 2010-06-14 DIAGNOSIS — I2699 Other pulmonary embolism without acute cor pulmonale: Secondary | ICD-10-CM

## 2010-06-14 DIAGNOSIS — Z7901 Long term (current) use of anticoagulants: Secondary | ICD-10-CM

## 2010-06-14 NOTE — Patient Instructions (Signed)
Continue current dose. Return in 4 weeks.

## 2010-06-18 NOTE — Assessment & Plan Note (Signed)
OFFICE VISIT   [name], [name]  DOB:  11-May-1965                                        October 18, 2009  CHART #:  [id number]   CURRENT PROBLEMS:  1. Status post right video-assisted thoracoscopic surgery,      decortication, talc pleurodesis, and placement of a right ox      catheter for chronic loculated and recurrent pleural effusion,      inflammatory from lupus.  2. 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  3. Morbid obesity.   PRESENT ILLNESS:  The patient returns for first office visit after  undergoing right VATS, decortication, talc pleurodesis, and placement of  Pleurx catheter in late August for a recurrent pleural effusion with  significant shortness of breath and chest discomfort.  She did well  following surgery and thus far her Pleurx drainage sessions have been  only minimal return.  She is off oxygen and her saturation is 97%.  She  is planning on returning to work after 2-3 weeks of recovery to  rehabilitation.  She is having minimal pain, and the incisions are  healing well.  She does have a history of pulmonary embolus and is on  chronic Coumadin at this point as well as a prednisone for lupus.   PHYSICAL EXAMINATION:  Blood pressure is 130/80, saturation 97%, pulse  80 and regular.  She is afebrile.  Breath sounds are clear on the right,  and the incision is well healed.   DIAGNOSTIC TESTS:  Chest x-ray shows no significant pleural effusion.  We tried to drain her chest with Pleurx catheter; however, only less  than 50 mL was returned.   PLAN:  It appears the talc pleurodesis has sealed her recurrent  effusion, but we will leave the Pleurx in for another 2-3 weeks for  insurance against late recurrence.  I plan on seeing her back  in approximately 2 weeks with a chest x-ray.  We will discontinue the  home health nursing Pleurx draining sessions.   Kerin Perna, M.D.  Electronically Signed   PV/MEDQ   D:  10/18/2009  T:  10/19/2009  Job:  [id number]   cc:   Charlcie Cradle. Delford Field, MD, FCCP  Marne A. Milinda Antis, MD

## 2010-06-18 NOTE — Assessment & Plan Note (Signed)
OFFICE VISIT   [name], HAMPE A  DOB:  January 16, 1966                                        November 19, 2009  CHART #:  [id number]   HISTORY:  The patient is status post right video-assisted thoracoscopy  with drainage of recurrent effusions, talc pleurodesis, and PleurX  catheter placement on October 01, 2009.  She subsequently had removal of  the right PleurX catheter on October 26, 2009.  She has been seen in  the office for a chest tube site wound that has required packing of  gauze over time.  On today's date, she is seen to reevaluate this.   PHYSICAL EXAMINATION:  Vital Signs:  Blood pressure 152/89, respirations  18, and oxygen saturation is 91% on room air.  General Appearance:  A  well-developed, obese adult female in no acute distress.  Chest:  Examination of the chest tube site reveals a less than 1-cm opening of  the wound.  I probed this with a sterile Q-tip.  There is no drainage.  There is no erythema.   ASSESSMENT:  The chest tube site appears to be healed.  There is minimal  opening and I think at this point, no longer requires packing.  She can  cover this with a dry dressing p.r.n. to protect her clothing.  We will  see her again on a p.r.n. basis.   Rowe Clack, P.A.-C.   Sherryll Burger  D:  11/19/2009  T:  11/20/2009  Job:  [id number]   cc:   Charlcie Cradle. Delford Field, MD, FCCP

## 2010-06-18 NOTE — Assessment & Plan Note (Signed)
OFFICE VISIT   KEM, PARCHER  DOB:  February 04, 1965                                        April 21, 2008  CHART #:  [id number]   PRIMARY CARE PHYSICIAN:  Idamae Schuller A. Tower, MD   PULMONOLOGIST:  Charlcie Cradle. Delford Field, MD, FCCP   REASON FOR OFFICE VISIT:  1. A 10-cm encapsulated pleural effusion at the left lung base,      cytology and culture negative.  2. Morbid obesity.  3. Systemic lupus diagnosed by kidney biopsy in 2006.  4. Sleep apnea, on home CPAP.  5. Hypertension.  6. Factor V Leiden mutation with history of superficial phlebitis of      the left leg in 2005.  7. Anxiety disorder - panic attacks.   PRESENT ILLNESS:  The patient is a 45 year old morbidly obese Caucasian  female, nonsmoker, who returns to the office today to further discuss  recent diagnosis of a large encapsulated left pleural effusion versus  empyema.  This was found when she was admitted with dizziness  approximately 2 weeks ago.  A thoracentesis of the process revealed  negative cytology and a culture revealed no bacteria, no white cells,  and no growth.  I discussed surgical decortication with the patient  during her hospitalization, but she declined surgery and said that the  area was asymptomatic and she wanted to consider further options.  She  was given a course of oral antibiotics (Avelox) and discharged from the  medical service, now returns for further discussion.   Since her discharge from the hospital, she denies fever, back or chest  pain, or shortness of breath.  Today while coming to the office, she  developed upper chest and shoulder tingling and pressure and  tachycardia, but she does not feel this is a panic attack.  She denies  productive cough, nausea, vomiting, or leg tenderness.   CURRENT MEDICATIONS:  Paxil 40 mg, levothyroxine 300 mcg, aspirin 81 mg,  and Vasotec.   PHYSICAL EXAMINATION:  VITAL SIGNS:  Blood pressure 170/100, pulse 97,  respirations 18,  saturation 99%, and temperature 98.4.  GENERAL:  She is anxious, but alert and oriented.  LUNGS:  Breath sounds are clear, but difficult to evaluate due to her  obesity.  CARDIAC:  Rhythm is regular without rub or gallop.  NECK:  No palpable neck nodes.  ABDOMEN:  Obese.  EXTREMITIES:  She has no leg tenderness, but she does have chronic  erythema and induration from venous stasis in her right tibial area  above the ankle.   PA and lateral chest x-ray today shows no change in the large,  loculated, and encapsulated fluid collection at the left lung base.   IMPRESSION AND PLAN:  I again discussed the situation with the patient  and told her basically there is no other way to treat this loculated  collection other than with surgery.  Rather than a formal decortication,  a limited resection and drainage would probably the preferred approach.  [name] upper chest and shoulder heaviness and numbness, anxiety,  shortness of breath, and tachycardia will be evaluated as she is being  transferred to the emergency department for possible cardiac or other  pulmonary issues.  A rhythm strip taken today shows her to be in sinus  rhythm with a heart rate of 100 beats per minute.  Kerin Perna, M.D.  Electronically Signed   PV/MEDQ  D:  04/21/2008  T:  04/22/2008  Job:  [id number]   cc:   Marne A. Milinda Antis, MD

## 2010-06-18 NOTE — Discharge Summary (Signed)
NAMEHALO, SHEVLIN NO.:  [id number]   MEDICAL RECORD NO.:  [id number]          PATIENT TYPE:  INP   LOCATION:  3039                         FACILITY:  [facility]   PHYSICIAN:  Raenette Rover. [name], MDDATE OF BIRTH:  October 21, 1965   DATE OF ADMISSION:  04/06/2008  DATE OF DISCHARGE:  04/08/2008                               DISCHARGE SUMMARY    This patient also has information under the name of Ariaunna Longsworth, same date  of birth, and same medical record number.  Cox was her married name.  She has now reverted to her maiden name.   DISCHARGE DIAGNOSES:  1. Large pleural fluid collection (?) empyema versus inflammatory      status post thoracentesis with no evidence of persisting infection      status post thoracic surgery with no recommendations for further      treatment at this time.  2. (?) Left lower lobe pneumonia versus atelectasis, nontoxic,      afebrile.  No pulmonary symptoms, but we will complete 10 days      antibiotic therapy due to complications as above.  3. History of systemic lupus, not on any medications for over 3 years,      but associated with history of glomerulonephritis and pleural      effusion in 2006.  See details below as well as large pleural fluid      collection.  4. Dizziness prior to admission.  No cardiac abnormalities or      arrhythmias identified.  5. Hypothyroidism.  Continue home medications.  6. History of depression and anxiety.  Continue home medications.  7. Morbid obesity.  Continue effort at weight loss with outpatient      followup with primary MD.   DISCHARGE MEDICATIONS:  Avelox 400 mg daily x7 additional days, which we  will complete in 10-day antibiotic course.  Other medications are as  prior to admission without change and include:  1. Paxil 40 mg daily.  2. Aspirin 81 mg daily.  3. Synthroid 200 plus 75 mcg daily.  4. Enalapril 10 mg daily.   DISPOSITION:  The patient is discharged home.  Hospital followup is  going to be in the next week with primary care physician Dr. Roxy Manns, also with a referral to pulmonologist Dr. Delford Field for outpatient  management and following of this left pleural collection.   Consults this hospitalization include thoracic surgery, Kerin Perna,  M.D.   PROCEDURES:  Include a thoracentesis by Interventional Radiology on  March 5 with 60 mL of brown fluid obtained and sent to lab, no  complicating pneumothorax, cultures pending at time of dictation, but  appeared negative.  Electrolyte, cell count and diff pending at time of  dictation.  Blood cultures x2 on admission 03/04, no growth to date.   CONDITION ON DISCHARGE:  Asymptomatic, hemodynamically stable and  without systemic complaints.   HOSPITAL COURSE:  1. Left pleural effusion.  The patient is a 45 year old woman with      history of an inflammatory pleural effusion in 2006 related to  lupus who has been doing well without complications and in fact off      all treatment for lupus over the last 3 years who presented to the      emergency room at [facility] day of admission due to the feeling of      dizziness and tingling of her upper extremities while driving to      work, she came to the hospital [facility] for further evaluation of      same and a cardiac evaluation was found to be negative.  She had      what appeared to be a possible empyema versus chronic serous      pleural collection in her left chest.  This was confirmed with CT      chest and she was referred for admission and evaluation of same.      She has had no pulmonary symptoms.  She has been nontoxic and      without systemic complaints.  She has had no fever, no shortness of      breath or cough and laboratory evaluation showed a normal white      count, normal platelet count, normal cardiac evaluation, and normal      electrolytes.  Blood cultures were however drawn and she was begun      on broad-spectrum IV antibiotics  including vancomycin, Rocephin,      and azithromycin until exclusion of empyema could be made.  She was      seen in consultation by thoracic surgery who felt that her      encapsulated fluid was likely residual, related to prior      inflammatory collection as noted from 2005 and believed that the      only way to permanently exclude and resolve this collection would      be through a thoracotomy and a decortication.  However, the patient      is not an optimal surgical candidate due to her obesity and thus      recommended only an ultrasound direct thoracentesis to evaluate for      infection as a first step.  This was performed on March 5 which is      noted above, did not seem to show any evidence of infection.  The      final cultures were still pending at this time of dictation as the      patient is not a candidate nor does she have desire for the      cortical procedure as she is asymptomatic from this collection and      there is no evidence of systemic disease.  She is felt stable for      discharge home to continue with outpatient followup and      surveillance by her primary care physician and pulmonologist Dr.      Delford Field.  CT, chest angio to evaluate this pleural collection also      questioned associated left lower lobe airspace disease versus      atelectasis and as such, she will be continued on an additional 7      days of empiric antibiotic for possible pneumonia given the      clinical concerns in the differential as described here.  The      patient has remained hemodynamically stable and asymptomatic      throughout this hospitalization and understands plans for continued      followup and surveillance on  this issue in the future.  2. Dizziness.  Again this is the patient's primary complaint at the      time of admission, however, had resolved prior to even the ER      evaluation.  She was monitored on telemetry for the first 24 hours      and serial cardiac enzymes  were cycled, but negative for any      evidence of acute coronary syndrome.  She has been hemodynamically      stable and there is no need for further workup of same at this      time.  Continue home medications as prior to admission with      outpatient followup as needed.  .  Greater than 30 minutes on day      of discharge, coordination for planning as described here.      Valerie A. [name], MD  Electronically Signed     VAL/MEDQ  D:  04/08/2008  T:  04/09/2008  Job:  [id number]

## 2010-06-18 NOTE — Assessment & Plan Note (Signed)
OFFICE VISIT   KINETA, FUDALA A  DOB:  1965/03/27                                        November 04, 2009  CHART #:  [id number]   CURRENT PROBLEMS:  1. Status post right VATS, drainage of recurrent effusion, talc      pleurodesis and Pleurx catheter placement, October 01, 2009.  2. Removal of right Pleurx catheter, October 26, 2009.   HISTORY OF PRESENT ILLNESS:  The patient returns for the evaluation of  right pleural disease and her recent surgery.  Pleurx catheter site is  healed and the sutures are removed.  Her chest x-ray shows no  significant recurrent right pleural effusion and she is functionally  improving and has resumed going back to work.  The incisions are healing  from her mini thoracotomy and Pleurx catheter.  There is a chest tube  site that is healing secondarily and is being packed with saline wet-to-  dry 2 x 2 gauze and is now very superficial and clean.   PHYSICAL EXAMINATION:  Blood pressure 160/90, pulse 80, saturation 95%.  Breath sounds are clear and equal.  The chest tube site is repacked with  a 2 x 2 gauze.  This is clean and superficial.  Pleurx catheter site is  clean.   A PA and lateral chest x-ray shows a clear right lung field.  She has  chronic changes in the left lung from an entrapped left lung.   PLAN:  The patient will continue her wound care of the chest tube site  and return to our clinic in approximately 2 weeks.   Kerin Perna, M.D.  Electronically Signed   PV/MEDQ  D:  11/04/2009  T:  11/05/2009  Job:  [id number]   cc:   Charlcie Cradle. Delford Field, MD, FCCP

## 2010-06-18 NOTE — Discharge Summary (Signed)
[name], [name] NO.:  [id number]   MEDICAL RECORD NO.:  [id number]          PATIENT TYPE:  INP   LOCATION:  2040                         FACILITY:  [facility]   PHYSICIAN:  Kerin Perna, M.D.  DATE OF BIRTH:  February 10, 1965   DATE OF ADMISSION:  05/24/2008  DATE OF DISCHARGE:                               DISCHARGE SUMMARY   ADDENDUM   Please add under pathology after procedure.   Pleural peel, pleural wall of empyema capsule and empyema contents all  showed no atypia or malignancy.  Fibrinous exudates, fiber adipose  tissue with associated acute and chronic inflammation were seen.  Also  cytology of left pleural fluid showed reactive meso-epithelial cells  present.      Doree Fudge, [place]      Kerin Perna, M.D.  Electronically Signed    DZ/MEDQ  D:  05/29/2008  T:  05/30/2008  Job:  [id number]   cc:   Kerin Perna, M.D.  Charlcie Cradle Delford Field, MD, FCCP  Marne A. Milinda Antis, MD

## 2010-06-18 NOTE — Assessment & Plan Note (Signed)
OFFICE VISIT   [name], [name]  DOB:  February 21, 1965                                        Jun 30, 2008  CHART #:  [id number]   CURRENT PROBLEMS:  1. Status post left VATS drainage and debridement of sterile empyema      May 24, 2008.  2. Morbid obesity.  3. Systemic lupus erythematosus.  4. Sleep apnea.  5. Factor V Leiden mutation with history of deep vein thrombosis.   PRESENT ILLNESS:  The patient is a 45 year old nonsmoker, who returns  for her second postop visit after undergoing a VATS drainage and  debridement of a large 10 cm cavity in the left pleural space.  Cultures  and cytologies on the material removed were all negative.  A 1 L of  chocolate-colored fluid along with a significant mount of fibrinous soft  tissue material was cleaned out.  Over the ensuing months, she has  slowly reaccumulated the fluid, which is asymptomatic.  A limited  procedure was performed with the scope and a small rib resection as she  was not considered a candidate for full decortication, capsulectomy,  which would require a major thoracotomy.   She is having minimal incisional pain.   PHYSICAL EXAMINATION:  VITAL SIGNS:  Temperature afebrile.  Oxygen  saturation 97%.  Blood pressure 150/90, pulse 100, and respirations 18.  LUNGS:  Breath sounds are slightly diminished on the left.  SKIN:  The incisions are well healed.   LABORATORY DATA:  PA and lateral chest x-ray shows no change with stable  reaccumulation of some of the fluid in the space.   PLAN:  I told the patient that only a full decortication with the major  thoracotomy would eliminate the space at this point since her first  fluid collection was treated with a thoracentesis in 2006.  Hopefully  with removal of the high-density solid material, the tendency for this  collection to increase over the years will be diminished.  If she ever  has significant weight loss, so that she is not morbidly obese and  a  decortication via thoracotomy would be reasonable approach.  She will be  followed by her regular medical physicians and return here as needed.   Kerin Perna, M.D.  Electronically Signed   PV/MEDQ  D:  06/30/2008  T:  07/01/2008  Job:  [id number]   cc:   Charlcie Cradle. Delford Field, MD, FCCP  Marne A. Milinda Antis, MD

## 2010-06-18 NOTE — Assessment & Plan Note (Signed)
OFFICE VISIT   Cheryl, Price  DOB:  1966-01-08                                        October 24, 2009  CHART #:  [id number]   CURRENT PROBLEMS:  1. Recurrent right pleural effusion status post right video-assisted      thoracoscopic surgery, talc pleurodesis, and PleurX catheter      placement on October 01, 2009.  2. History of lupus, on chronic prednisone therapy.  3. 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  4. Morbid obesity.   PRESENT ILLNESS:  The patient is a very nice 45 year old female returns  for PleurX catheter followup.  Her breathing has significantly improved  since the right VATS procedure.  The chest tube site opened and is being  packed and healing secondarily and is very clean.  She finished a course  of oral Keflex.  The PleurX catheter has had minimal drainage since the  surgery and today and attempted drainage was with zero return.  Her  chest x-ray shows a clear right lung field with no evidence of a  retained pleural effusion.  She is stronger and planning on returning to  work next week.  She wishes to have the PleurX catheter removed as  fortunately the effusion appears to have resolved and the catheter is no  longer needed.  She is tapering her steroid dose under the direction of  Dr. Delford Field from 40 mg down to 10 mg a day.  She remains on chronic  Coumadin therapy for history of factor V Leiden deficiency and recent  pulmonary embolus.  She has had postthoracotomy pain on the left side  following her left VATS and drainage of an effusion and that is  responding well to Lyrica 75 mg a day.   PHYSICAL EXAMINATION:  Vital Signs:  Blood pressure 120/80, pulse 76,  respirations 18, and saturation on room air 95%.  General:  She is alert  and pleasant.  Lungs:  Breath sounds are clear and equal.  The right  chest tube site is cleaned and packed with a 2 x 2 gauze  wet-to-dry.  She has some probable yeast under her right breast line.  The PleurX  catheter is intact.   DIAGNOSTIC TESTS:  Her chest x-ray today shows clear lung field on the  right with chronic changes on the left.   PLAN:  Her loculated recurrent right pleural effusion is resolved and  the PleurX catheter will be removed in short stay on this Friday.  She  was given another prescription for Lyrica for postthoracotomy pain on  the left side.  She was also given a prescription for some nystatin  powder for her yeast infection under the right breast line.  She will  return for the PleurX catheter on Friday, October 26, 2009.   Kerin Perna, M.D.  Electronically Signed   PV/MEDQ  D:  10/24/2009  T:  10/25/2009  Job:  [id number]   cc:   Charlcie Cradle. Delford Field, MD, FCCP

## 2010-06-18 NOTE — Op Note (Signed)
NAMEMARIANN, [name] NO.:  [id number]   MEDICAL RECORD NO.:  [id number]          PATIENT TYPE:  INP   LOCATION:  2302                         FACILITY:  [facility]   PHYSICIAN:  Kerin Perna, M.D.  DATE OF BIRTH:  08-17-1965   DATE OF PROCEDURE:  05/24/2008  DATE OF DISCHARGE:                               OPERATIVE REPORT   OPERATION:  Left video-assisted thoracoscopic surgery, decortication and  drainage of empyema.   PREOPERATIVE DIAGNOSIS:  Encapsulated, chronic empyema left lower  hemithorax.   POSTOPERATIVE DIAGNOSIS:  Encapsulated, chronic empyema left lower  hemithorax.   SURGEON:  Kerin Perna, MD   ASSISTANT:  Coral Ceo, PA-C   ANESTHESIA:  General by Dr. Arta Bruce.   INDICATIONS:  The patient is a 45 year old morbidly obese Caucasian  female with history of lupus who presented to the hospital with  dizziness and was found by chest x-ray to have an incidental finding of  a large 10 cm encapsulated fluid filled mass.  This was confirmed by CT  scan and a thoracentesis returned fluid, negative culture and negative  cytology.  The patient had a prior history of pleurisy and left pleural  effusion in 2006 which was treated with thoracentesis but did not  completely resolved.  She has no pulmonary symptoms or fever.  She  returns now for a limited approach to debride and drain this collection  and decortication.  Due to her morbidly obese body habitus, a full  thoracotomy and decortication did not seem to be appropriate or safe.   PROCEDURE:  The patient was brought to the operating room after 2 visits  to the office and a full explanation of the proposed operation including  the alternatives and risks.  With difficulty, the anesthesiologist  placed an endotracheal tube, single lumen.  She was then turned and the  left chest was prepped and draped as a sterile Price.  After a proper  time-out was performed to verify the patient and the site  of surgery, a  small incision was made in the sixth interspace posterior to the tip of  the scapula and carried down to the chest wall.  This was quite deep.  Through the interspace, a needle aspirated thick, dark fluid and basing  this location, I made an incision in between the ribs and excised a  portion of the sixth rib piercing the pleura which was extremely thick,  measuring approximately 10 mm.  A loculated cavity was entered and  drained.  1 liter of chocolate colored fluid was removed with fluids  sent for both cytology and culture.  The VATS camera was then inserted  through the small incision and there was a large amount of fibrinous  exudate hanging from the upper aspect of the cavity and also on the  floor.  Using the VATS instruments, this was mechanically debrided and  the cavity was also irrigated with copious amounts of sterile saline.  Using sharp dissection, pieces of the capsule were submitted for both  pathology and culture.  After the space had been adequately debrided and  cleaned, a separate incision was made for a 36-French angled chest tube  which was placed dependently in the space and secured to the skin.  Through this small incision, I was not able to excise the capsule of  this collection but debrided and drained it completely.  The small  incision or the area of rib resection was  then closed in layers using Vicryl and interrupted 3-0 nylon for the  skin.  The chest tube was connected to an underwater seal suction device  and the patient was extubated in the operating room and returned to the  recovery room in stable condition.      Kerin Perna, M.D.  Electronically Signed     PV/MEDQ  D:  05/24/2008  T:  05/25/2008  Job:  [id number]   cc:   Cheryl Cradle. Delford Field, MD, FCCP

## 2010-06-18 NOTE — Consult Note (Signed)
NEW PATIENT CONSULTATION   CHARLET, HARR  DOB:  06/19/1965                                        September 26, 2009  CHART #:  [id number]   REASON FOR CONSULTATION:  Recurrent right pleural effusion with  shortness of breath, chronic lupus.   CHIEF COMPLAINT:  Shortness of breath.   HISTORY OF PRESENT ILLNESS:  The patient is a 45 year old Caucasian  obese ex-smoker, who presents for evaluation and treatment of recurrent  right pleural effusion.  The patient has a long history of systemic  lupus and has followed by Dr. Kellie Simmering.  She recently has had increasing  shortness of breath and decreased excise tolerance associated with the  onset of a moderate large right pleural effusion.  This was treated with  increasing her steroid dose and then subsequently a right thoracentesis  was performed on September 17, 2009, which removed 1.5 L.  Since that time,  9 days ago, she has had some return of her shortness of breath and a  followup chest x-ray shows reaccumulation of the effusion.  For that  reason, Dr. Danise Mina, her pulmonologist has referred her for possible  VATS and drainage with pleurodesis and possible PleurX catheter.   The patient has a complicated history from lupus as well as factor V  Leiden deficiency.  In April 2010, she presented with a large loculated  collection in her left chest, which required mini thoracotomy and chest  tube drainage of an 8-cm cystic structure with an area of thick wall,  which she had chocolate-colored fluid, which was culture negative and  cytology negative.  In April 2011, she presented with chest pain and  shortness of breath and was found to have a pulmonary embolus in the  right lower lobe and was placed on heparin and Coumadin with improvement  of her symptoms.  She functioned fairly well and was able to work full-  time until recently when she developed shortness of breath with the  onset of the right pleural effusion.   Symptomatically, she improved  after the thoracentesis.  Six days ago, she had the onset of left back  pain, typical post thoracotomy with neuritic-type symptoms and has been  taking Vicodin.  Because of the onset of left-sided chest pain, a repeat  CT scan was performed to rule out pulmonary embolus and no pulmonary  embolus was noted.  The CT scan also showed no change in the chronic  collection in her left chest, which reaccumulated after the chest tube  drainage procedure in 2010.   The patient has been taking Lovenox 150 mg b.i.d. for the past week and  half since thoracentesis as she was planned.  She was felt to be a  surgical candidate for a right VATS procedure.  She has had no overt  bleeding problems on the Lovenox.   Her other recent problems include phlebitis of her right upper  extremity, recent needle aspirate of a large right axillary lymph node,  which was negative for lymphoma and negative for TB and routine culture,  and history of right superficial femoral DVT in April of this year.   PAST MEDICAL HISTORY.:  1. Systemic lupus.  2. Factor V Leiden deficiency.  3. Hypertension.  4. Morbid obesity.  5. Anxiety.  6. Hypothyroid.  7. Hyperlipidemia.  8. Allergy to  penicillin.   HOME MEDICATIONS:  1. Lovenox 150 subcu q.12 hours.  2. Paxil 40 mg daily.  3. Aspirin 81 mg daily.  4. Synthroid 200 mcg daily.  5. Xanax 0.5 mg b.i.d. p.r.n.  6. Prilosec 20 mg daily.  7. Vicodin 7.5 mg p.r.n.  8. Plaquenil 200 mg t.i.d.  9. Crestor 10 mg daily.  10.Prednisone 40 mg daily.  11.Bystolic 10 mg daily.  12.Synthroid 25 mcg daily.   SOCIAL HISTORY:  The patient works full-time and has 3 children.  She  stopped smoking 4-5 years ago.   FAMILY HISTORY:  Positive for lupus, positive for diabetes.   REVIEW OF SYSTEMS:  Since her VATS surgery last year, she has gained  weight from 303 to 335 pounds.  She has a nonproductive cough.  She  denies bleeding problems or  hemoptysis.  She has some postthoracotomy  pain in her left shoulder now and was started on some Cymbalta for this.  She has history of sleep apnea in her chart, but has not been treated  with a BiPAP mask.  She has maintained sinus rhythm without heart  problems.  Her last echo 4-5 years ago showed EF of 70% and a small  pericardial effusion.  An echo is pending prior to her upcoming surgery.  She has a history of superficial venous thrombosis of her right leg.  No  history of claudication or TIA.  No history of stroke or seizure.   PHYSICAL EXAMINATION:  Vital Signs:  She is 5 feet 5 inches and weighs  340 pounds.  Her BMI is 57, oxygen saturation is 95% on room air,  temperature 98.3, blood pressure 120/70, and pulse 72 and regular.  General Appearance:  A young morbidly obese Caucasian female, anxious,  but in no acute distress.  HEENT:  Normocephalic.  Dentition is good.  Neck:  Without JVD or bruit.  Lymphatics show no palpable  supraclavicular or cervical adenopathy.  Lungs:  Thorax reveals well-  healed left mini thoracotomy incision.  Breath sounds are diminished on  the right, greater than the left base.  No wheezes.  Cardiac:  Rhythm is  regular without gallop or murmur.  Abdomen:  Obese and nontender.  Extremities:  No cyanosis, clubbing, or tenderness.  Peripheral pulses  are intact.  Skin:  Some papules along the right arm neurologic:  Nonfocal.   LABORATORY DATA:  I reviewed her recent CT scans, chest x-rays, and  laboratory data.  She has a recurrent right pleural effusion with a  chronic left loculated effusion, which was minimally improved after a  VATS drainage procedure.  Corrective surgery for this require a full  thoracotomy and formal decortication which would be a prohibitive risk  in this morbidly obese patient.   IMPRESSION AND PLAN:  The patient has recurrent effusion on the right  side and I agree with Dr. Delford Field that a surgical procedure needs to be  done  to prevent a loculated situation with entrapped lung as she has  developed on the left.  We will keep her on her Lovenox and plan surgery  in approximately 5 days to perform a VATS drainage and talc pleurodesis  on the right with probable placement of a PleurX catheter as a backup if  she does have recurrent effusions.  If not, the PleurX could be easily  removed if not needed.  I have discussed this procedure with the patient  in detail.  She understands we will be using general anesthesia  and due  to her morbid obesity, she will be at increased risk of pulmonary  complications.  She understands due to her coagulation requirement, she  will be at increased risk for bleeding complications.  She also  understands due to her obesity and steroids, she will be at increased  risk for wound healing, infections, or pneumonia complications.  After  reviewing these issues, she demonstrates her understanding and agrees to  proceed with surgery under what I feel is an informed consent.   Kerin Perna, M.D.  Electronically Signed   PV/MEDQ  D:  09/26/2009  T:  09/27/2009  Job:  [id number]   cc:   Charlcie Cradle. Delford Field, MD, [facility]  Stacey Drain, MD

## 2010-06-18 NOTE — Assessment & Plan Note (Signed)
OFFICE VISIT   [name], [name]  DOB:  [birth year]                                        Jun 16, 2008  CHART #:  [id number]   CURRENT PROBLEMS:  1. Status post left VATS, decortication, and drainage of a sterile      empyema on May 24, 2008.  2. Morbid obesity.  3. Systemic lupus erythematosus.  4. Sleep apnea.  5. Hypertension.  6. Factor V Leiden mutation with history of deep vein thrombosis.   PRESENT ILLNESS:  The patient returns for her first postop check after  undergoing left VATS decortication for a wound check, suture removal,  and a chest x-ray check.  She has been home approximately 3 weeks.  She  has been walking and starting to drive.  Denies fever.  She has some  minimal incisional pain handled with Tylenol.  She denies cough.   PHYSICAL EXAMINATION:  VITAL SIGNS:  Blood pressure 150/90, pulse 100,  respirations 18, saturation 97%, temperature 98.5, and weight 315  pounds.  CHEST:  The small VATS incision has healed.  The chest tube site has  some eschar, but the sutures are removed and this should heal.  LUNGS:  The breath sounds are mildly diminished on the left compared to  the right.  CARDIAC:  Rhythm is regular.   PA and lateral chest x-ray shows some reaccumulation of the fluid in the  left space as a form of decortication was not possible due to her morbid  obesity.  The cultures on the fluid with cytology and pathology on the  tissue were all negative.  She has completed an extensive course of oral  antibiotics.   PLAN:  I will plan on accepting the recurrence of the effusion after  cleaning out all the solid material which should reduce the risk of  further contamination and infection.  I will plan on following her with  serial x-ray studies and she will return in 2 weeks with a chest x-ray  to discuss return to work issues.  No prescriptions were provided at  this office visit.   Kerin Perna, M.D.  Electronically  Signed   PV/MEDQ  D:  06/16/2008  T:  06/17/2008  Job:  [id number]

## 2010-06-18 NOTE — Consult Note (Signed)
NAMETAMMA, BRIGANDI NO.:  [id number]   MEDICAL RECORD NO.:  [id number]          PATIENT TYPE:  INP   LOCATION:  3039                         FACILITY:  [facility]   PHYSICIAN:  Kerin Perna, M.D.  DATE OF BIRTH:  03-25-1965   DATE OF CONSULTATION:  04/07/2008  DATE OF DISCHARGE:  04/08/2008                                 CONSULTATION   REQUESTING CONSULTATION:  Vikki Ports A. [name], MD.   PRIMARY CARE PHYSICIAN:  Marne A. Tower, MD   REASON FOR CONSULTATION:  A 10-cm encapsulated cystic mass at the left  lung base.   CHIEF COMPLAINT:  Abnormal x-ray.   HISTORY OF PRESENT ILLNESS:  I was asked to evaluate this 45 year old  morbidly obese Caucasian female with a remote smoking history for  evaluation therapy, recently diagnosed thick-walled encapsulated cystic  mass measuring 10 x 15 cm at the left lung base.  The patient was  admitted to the emergency department with dizziness and numbness and  tingling of her upper extremities and some diaphoresis.   Her pulmonary history dates to 2006 when she was evaluated by Dr. Delford Field  in [facility] for a moderate-to-large left pleural  effusion.  This was treated with a thoracentesis with negative cytology  and negative cultures.  The last chest x-ray showed a significant  residual left pleural effusion, and at that point, the patient was  evaluated and diagnosed with sleep apnea and went on CPAP.  She has had  no pulmonary followup for the past 3-4 years until her presentation  today which showed a large, rounded density at the left lung base on  chest x-ray.  She had no associated fever, chest pain, cough, or  shortness of breath.  A CT scan was performed which delineated the thick-  walled cystic cavity at the left lung base measuring 10 x 15 cm.  There  is no other suspicious pulmonary masses or abnormal mediastinal  adenopathy.  The patient underwent a thoracentesis which removed 90 mL  of brownish  material with culture negative results so far.  Cytologies  are pending.  The patient was placed on antibiotics on admission due to  concern over this representing an extrapulmonary empyema by the  interpretation of the radiologist reading the CT scan.  She has remained  afebrile in the hospital with a normal white count and she is to be  discharged home on oral Avelox for 1 week.   PAST MEDICAL HISTORY:  1. Morbid obesity, weighed between 325 and 350 pounds, height 5 feet 4      inches.  2. Systemic lupus diagnosed by kidney biopsy in 2006.  3. Sleep apnea on home CPAP.  4. Hypertension.  5. Factor V Leiden and mutation with history of superficial phlebitis      of the leg vein.  6. Anxiety disorder.  7. No known drug allergies.   HOME MEDICATIONS:  Aspirin 81 mg, enalapril, Paxil 40 mg, levothyroxine  300 mcg daily.   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  She is a former smoker,  does not use alcohol.   FAMILY HISTORY:  Positive for lupus in a sister and positive for  diabetes in a grandmother.   PAST SURGICAL HISTORY:  Superficial thrombophlebitis of right leg during  pregnancy 2001, history of abruptio placenta during one of her  deliveries, history of thoracentesis of the left of pleural effusion  2006.   REVIEW OF SYSTEMS:  Her weight has always been high since her  pregnancies.  She has denied any recent fever or chills or night sweats.  She has not had any influenza and illnesses over this past winter and  does not use the influenza vaccine.  ENT:  Review is negative for  difficulty swallowing, positive for recent dizziness and numbness from  the right eye.  The head CT scan on admission was negative.  Cardiac:  Review is negative for arrhythmia, murmur or angina.  Pulmonary:  Review  is positive for the left basilar lung mass and history of sleep apnea  followed by Dr. Craige Cotta and Dr. Shan Levans.  She was prescribed CPAP 14  cm of water pressure at night.  GI:  Review is negative for hepatitis,  jaundice or blood per rectum.  Endocrine:  Review is positive for  hypothyroidism and negative for diabetes.  Vascular, review is negative  for TIA or claudication.  Neurologic:  Review is negative for stroke or  seizure.   PHYSICAL EXAMINATION:  VITAL SIGNS:  The patient is 5 feet 4 inches and  weighs 328 pounds, temperature 98.2, blood pressure 150/90, pulse 95 and  regular, respirations 18, saturation on room air 95%.  GENERAL:  She is a middle-aged Caucasian obese female in no acute  distress.  HEENT:  Normocephalic.  LYMPHATICS:  Show no palpable supraclavicular or cervical adenopathy.  NECK:  Without JVD, mass or bruit.  LUNGS:  Breath sounds are clear except for some diminished breath sounds  with left base.  CARDIAC:  Rhythm is regular without S3 gallop or murmur.  ABDOMINAL EXAM:  Obese, soft, nontender without mass.  EXTREMITIES:  Reveal no clubbing, cyanosis or edema.  Peripheral pulses  are intact.  NEUROLOGIC:  Alert and oriented x3 without focal motor deficit.   LABORATORY DATA:  Reviewed her admission blood work and her chest x-ray  and CT scan.  She has a loculated fluid collection which appears to be  pleural at left lung base which may have represented accumulated fluid  with organization since her pleural effusion 4 years ago.   PLAN:  She will be admitted, placed on antibiotics and undergo  thoracentesis for culture of this fluid.  We will also obtain cytology.  The cultures negative, also follow the patient in the office to  recommend elective decortication of the left lung after she can lose a  significant amount of weight to reduce the operative risks.  This will  also, of course, depend on the results of the cytology, but the pleural  fluid 4 years ago cytology was negative.      Kerin Perna, M.D.  Electronically Signed     PV/MEDQ  D:   04/08/2008  T:  04/09/2008  Job:  [id number]   cc:   Marne A. Milinda Antis, MD

## 2010-06-18 NOTE — Discharge Summary (Signed)
[name], [name] NO.:  [id number]   MEDICAL RECORD NO.:  [id number]          PATIENT TYPE:  INP   LOCATION:  2040                         FACILITY:  [facility]   PHYSICIAN:  Kerin Perna, M.D.  DATE OF BIRTH:  Jun 05, 1965   DATE OF ADMISSION:  05/24/2008  DATE OF DISCHARGE:                               DISCHARGE SUMMARY   ADMITTING DIAGNOSES:  1. Left empyema.  2. History of left pleural effusion (status post thoracentesis in      2006).  3. History of sleep apnea.  4. History of systemic lupus erythematosus.  5. History of factor V Leiden mutation.  6. History of deep venous thrombosis.  7. History of hypertension.  8. History of morbid obesity.  9. Anxiety disorder.   DISCHARGE DIAGNOSES:  1. Left empyema.  2. History of left pleural effusion (status post thoracentesis in      2006).  3. History of sleep apnea.  4. History of systemic lupus erythematosus.  5. History of factor V Leiden mutation.  6. History of deep venous thrombosis.  7. History of hypertension.  8. History of morbid obesity.  9. Anxiety disorder.   BRIEF SUMMARY:  This is a 45 year old Caucasian female who in 2006 had a  left pleural effusion and underwent a thoracentesis.  Apparently  cytology and cultures were negative.  She had no further chest x-rays  since 2006, until she presented to [facility] with  complaints of dizziness and tingling of her extremities (felt likely  secondary to a panic attack, the patient with a history of anxiety  disorder).  She was treated with a course of antibiotics, but did not  have relief of her symptoms.  She was seen in the office by Dr. Donata Clay on May 04, 2008.  At that time, she had no complaints of cough,  fever, pleuritic pain, or shortness of breath.  Chest x-ray revealed a  relatively large intracapsular fluid collection in the basilar aspect of  her left pleural space.  The patient was admitted to [facility] on  May 24, 2008 to undergo the aforementioned left VATS, drainage of empyema,  and decortication on May 24, 2008 with Dr. Donata Clay.   BRIEF HOSPITAL COURSE STAY:  The patient had minimal chest tube drainage  postoperatively.  No air leak was noted on her chest x-ray.  She  remained afebrile and hemodynamically stable.  Chest tube was placed to  water seal on May 27, 2008.  Followup chest x-ray revealed a stable  pleural parenchymal opacity in the left lower hemithorax.  There was no  drainage or air leak noted from the chest tube and was removed on May 29, 2008.  Currently on this date, the patient is afebrile, vital signs  are stable.  All cultures thus far have been negative.  Provided that  the patient remains afebrile, hemodynamically stable and the chest x-ray  remains stable.  She will be discharged on May 30, 2008.  Latest  laboratory studies reveal BMET done on May 27, 2008, potassium 3.7,  BUN and creatinine were 10 and 0.61 respectively.  Last CBC done on  May 26, 2008, H and H 10.8 and 31.2 respectively, white count of  58,00, platelet count of 190,000.  Last chest x-ray done today, May 29, 2008, showed stable pleural parenchymal opacity in the left lower  hemithorax, right lung clear.  PA and lateral will be checked in the  morning prior to discharge.   DISCHARGE INSTRUCTIONS:  The patient is to remain on a low-sodium, heart-  healthy diet.  She may shower.  She is not to lift or drive for 3 weeks  until seen by Dr. Donata Clay.  She is to continue with her breathing  exercise daily.  She is to walk every day.  She may use soap and water  on her wound.  Her chest tube sutures will be removed by the nurse on  June 02, 2008 at 9:30 a.m.  She will see Dr. Donata Clay and follow up on  Jun 23, 2008 at 10:30 a.m.  Prior to this office appointment, a chest x-  ray will be obtained.      Doree Fudge, [place]      Kerin Perna, M.D.  Electronically  Signed    DZ/MEDQ  D:  05/29/2008  T:  05/30/2008  Job:  [id number]   cc:   Marne A. Milinda Antis, MD  Charlcie Cradle. Delford Field, MD, FCCP

## 2010-06-18 NOTE — H&P (Signed)
HISTORY AND PHYSICAL EXAMINATION   May 04, 2008   Re:  [name], [name]            DOB:  Jun 21, 1965   ADMISSION DIAGNOSIS:  A 10 cm encapsulated collection - empyema, left  pleural space.   CHIEF COMPLAINT:  Abnormal chest x-ray.   PRESENT ILLNESS:  The patient is a 45 year old morbidly obese,  nondiabetic Caucasian female, who has a diagnosis of a 10 cm  encapsulated fluid collection, left base, previously aspirated and  culture negative during hospitalization in March 2010.  In 2006, she was  evaluated and treated by Dr. Delford Field for a left pleural effusion and  underwent a thoracentesis with negative cytology and negative cultures.  This did not resolve the pleural effusion.  She has had no chest x-rays  since 2006 until she presented to the emergency department with some  dizziness and tingling of her extremities probably related to a panic  attack - anxiety disorder.  She is treated with a course of empiric  antibiotics without change.  She has no pulmonary symptoms including  cough, fever, pleuritic pain, or shortness of breath, but despite this  relatively large encapsulated fluid collection in the basilar aspect of  her left pleural space.  Because of the risk of this collection becoming  infected and causing severe sepsis, a VATS drainage procedure has been  recommended as the thoracentesis did not have success in draining much  fluid due to the thick nature of the material.   PAST MEDICAL HISTORY:  1. Morbid obesity.  Current weight 318 pounds, height 5 feet 4.  2. Systemic lupus diagnosed by kidney biopsy in 2007 with normal      creatinine.  3. Sleep apnea, on home CPAP 14 cm H2O.  4. Hypertension.  5. Factor V Leiden mutation with history of DVT of the right leg in      1991.  6. Anxiety disorder.  7. No known drug allergies.   HOME MEDICATIONS:  1. Aspirin 81 mg.  2. Enalapril 5 mg.  3. Paxil 40 mg.  4. Levothyroxine 300 mcg daily.  5. Xanax  p.r.n.   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  She does not smoke and does  not use alcohol.   FAMILY HISTORY:  Positive for lupus and positive for diabetes.   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   REVIEW OF SYSTEMS:  General constitutional review is negative fever or  weight loss.  Her weight has always been high since her pregnancies.  She denies any respiratory illnesses this past winter.  ENT review is  negative for difficulty swallowing, positive for recent dizziness and  numbness in her face.  PET scan on admission this past March was  negative.  Cardiac review is negative for arrhythmia, murmur, or angina.  Pulmonary review is positive for her left basilar lung mass due to a  fluid collection and history of sleep apnea, on home CPAP 14 cm oxygen  followed by Dr. Craige Cotta and Dr. Delford Field.  GI review is negative for  hepatitis, jaundice, blood per rectum, or abdominal pain.  Endocrine  review is positive for hypothyroidism and negative for diabetes.  Vascular review is negative for TIA or claudication.  Positive for DVT  in right leg several years ago.  Neurological review is negative stroke  or seizure.   PHYSICAL EXAMINATION:  VITAL SIGNS:  The patient is 5 feet 4, weighs 318  pounds.  Blood pressure 158/90, pulse 80, respirations 18, and  saturation 99%.  GENERAL:  She is alert and pleasant, and not anxious today.  HEENT:  Normocephalic.  Dentition good.  Pupils are reactive.  LYMPHATICS:  No palpable supraclavicular or cervical adenopathy.  LUNGS:  Breath sounds are diminished at the left base, otherwise clear.  CHEST:  The entry point for the thoracentesis was approximately 8 mm  above the end of her skin crease  on her back.  CARDIAC:  Regular rhythm without murmur or gallop.  NECK:  Without JVD, mass, or bruit.  ABDOMEN:  Obese, soft, nontender.  EXTREMITIES:  Some chronic rashes with a small purpura over her  extremities.  Her lower extremities show some venous stasis changes in  the tibial areas.  NEUROLOGIC:  Alert and oriented without focal deficit.   LABORATORY DATA:  She has a previous CT scan which shows the 10 cm  loculated collection without significant adenopathy or other  abnormalities.   The patient will be admitted for drainage and debridement of this  encapsulated fluid collection via a left VATS with partial resection and  a drainage tube to be placed under general anesthesia.  This was  discussed in detail with the patient.  She understands the indications,  risks of the procedure, to prevent infection of this space with severe  complications of intrathoracic close space infection.  She understands  the risks of pain, bleeding, ventilator dependence due to her morbid  obesity, further problems of DVT, persistent air leak, and death.  She  agrees to proceed with surgery after reviewing these issues.   Kerin Perna, M.D.  Electronically Signed   PV/MEDQ  D:  05/04/2008  T:  05/05/2008  Job:  [id number]   cc:   Marne A. Milinda Antis, MD  Charlcie Cradle. Delford Field, MD, FCCP

## 2010-06-18 NOTE — Procedures (Signed)
NAME:  [name], [name] NO.:  [id number]   MEDICAL RECORD NO.:  [id number]          PATIENT TYPE:  OUT   LOCATION:  SLEEP CENTER                 FACILITY:  [facility]   PHYSICIAN:  Coralyn Helling, MD        DATE OF BIRTH:  May 18, 1965   DATE OF STUDY:  06/09/2006                            NOCTURNAL POLYSOMNOGRAM   REFERRING PHYSICIAN:  Coralyn Helling, MD   INDICATION FOR STUDY:  This is an individual who had undergone a  polysomnogram May 04, 2006, which showed an overall apnea/hypopnea  index of 78 and oxygen saturation of 78%.  She was referred back to the  sleep lab for a CPAP titration study.   EPWORTH SLEEPINESS SCORE:  12.   MEDICATIONS:  Plaquenil, Paxil, Crestor, lisinopril, Micardis, aspirin,  Synthroid.   SLEEP ARCHITECTURE:  Total recording time was 379 minutes.  Total sleep  time was 300 minutes.  Sleep efficiency was 80%.  Sleep latency was 32  minutes which is prolonged.  REM latency was 67 minutes which is  reduced.  The study was notable for the lack of slow-wave sleep and a  significant increase in percentage of REM sleep to 54% of the study.  The patient slept in both the supine and non-supine position.   RESPIRATORY DATA:  The average respiratory rate was 16.  The patient was  titrated from a CPAP pressure setting of 5 to 17 cm of water.  At a CPAP  pressure setting of 14 cm of water, the apnea/hypopnea index was reduced  to 10.5.  At this pressure setting, the patient was observed in REM  sleep.  She did have occasional breakthrough episodes of respiratory  events as well as snoring at higher pressures, but she also appeared to  have more difficulty with sleep disruption as well as some evidence for  periodic breathing at higher pressure settings.   OXYGEN DATA:  The baseline oxygenation was 95%.  The oxygen saturation  nadir was 59%.  At a CPAP pressure setting of 14 cm of water, the mean  oxygenation during REM sleep was 98%.  The minimal  oxygenation during  REM sleep was 92%.   CARDIAC DATA:  Rhythm strip showed normal sinus rhythm.   MOVEMENT-PARASOMNIA:  The periodic limb movement index was 10.4, and the  patient had one bathroom trip.   IMPRESSIONS-RECOMMENDATIONS:  This was a CPAP titration study.  At a  CPAP pressure setting of 14 cm of water, the apnea/hypopnea index was  reduced to 10.5.  At this pressure setting, the patient was observed in  REM sleep.  She did appear to have more difficulty with sleep disruption  at higher pressure settings as well as having evidence for periodic  breathing at higher pressure settings, although she did also have some  episodes of  breakthrough snoring as well as breakthrough respiratory events.  She  appeared to do much better in the non-supine position, and positional  therapy in conjunction with CPAP therapy may be most beneficial for her.      Coralyn Helling, MD  Diplomat, American Board of Sleep Medicine  Electronically Signed     VS/MEDQ  D:  06/21/2006 09:20:17  T:  06/21/2006 16:10:96  Job:  [id number]

## 2010-06-21 NOTE — Assessment & Plan Note (Signed)
[facility]                             PULMONARY OFFICE NOTE   NAME:[name], [name]                           MRN:          [id number]  DATE:04/10/2006                            DOB:          06/23/1965    [name] is a 45 year old white female morbidly-obese, history of lupus  erythematosus with left pleural effusion in the past.  Have not seen her  since December 2006.  Overall, she is improved with decreased shortness  of breath and no active respiratory complaints but she is here today,  she has questions regarding potential sleep apnea.  She scores very  highly on the Epworth scale - she is up to 22 on that scale.  She has  significant daytime hypersomnolence, nodding off excessively with  minimal effort.  Also, increased snoring is noted and she does not wake  up rested.   She is on:  1. Aspirin 81 mg daily.  2. Paxil 40 mg daily.  3. Plaquenil 100 mg b.i.d.  4. Synthroid 0.3 mg daily.  5. Prednisone is off.  6. CellCept is off.  7. Crestor at 20 mg daily.  8. Micardis 80/25 daily.  9. Lisinopril 20 mg daily.   EXAMINATION:  VITAL SIGNS:  She is 350+ pounds, temperature 98, blood  pressure 134/82, pulse 105, saturation 99% in room air.  CHEST:  Showed to be completely clear to auscultation and percussion.  There was no evidence of wheeze, rale or rhonchi, and no adventitious  breath sounds were auscultated.  CARDIAC:  Showed a regular rate and rhythm without S3; normal S1, S2.  ABDOMEN:  Soft, nontender.  EXTREMITIES:  Showed no edema, clubbing or venous disease.   IMPRESSION:  That of likely obstructive sleep apnea in a patient with  morbid obesity.   The plan for this patient is to pursue a sleep study and referral to Dr.  Coralyn Helling.     Charlcie Cradle Delford Field, MD, [facility]  Electronically Signed    PEW/MedQ  DD: 04/10/2006  DT: 04/10/2006  Job #: [id number]   cc:   Coralyn Helling, MD  Terrial Rhodes, M.D.  Marne A. Milinda Antis, MD

## 2010-06-21 NOTE — Discharge Summary (Signed)
NAME:  [name], [name]                              ACCOUNT NO.:  [id number]   MEDICAL RECORD NO.:  [id number]                   PATIENT TYPE:  INP   LOCATION:  3710                                 FACILITY:  [facility]   PHYSICIAN:  Sean A. Everardo All, M.D. [facility]           DATE OF BIRTH:  [birth year]   DATE OF ADMISSION:  06/07/2003  DATE OF DISCHARGE:  06/10/2003                                 DISCHARGE SUMMARY   REASON FOR ADMISSION:  Fever.   HISTORY OF PRESENT ILLNESS:  The patient is a 45 year old woman admitted by  Dr. [name] on Jun 07, 2003, with fever and shortness of breath.  Please  refer to Dr. [name] History and Physical for details.   HOSPITAL COURSE:  The patient was admitted, and an exhaustive workup of the  fever failed to reveal any etiology.  She was treated with antibiotics, and  her fever resolved.   For her chest pain, arterial Dopplers of the legs did not show any DVT.  She  also had a ventilation perfusion lung scan which was negative as well.  Her  shortness of breath improved with prednisone.   She also was found to have elevated BUN and creatinine upon admission, and  this resolved with hydration.   She was hypothyroid on 225 mcg a day of Synthroid, and, therefore, this was  increased to 250 mcg a day.  This will need to be followed up as an  outpatient.   She also was found by cardiology to have a pericardial effusion, but she did  not have bacterial pericarditis.  She was also found not to have cardiac  tamponade.   She was feeling much better by Jun 10, 2003, and was discharged home.   DISCHARGE DIAGNOSES:  1. Fever, uncertain etiology, resolved with antibiotics and steroids.  2. Pericardial effusion, possibly due to #3 or #4.  3. Systemic lupus erythematosus.  4. Hypothyroidism.  5. Shortness of breath, uncertain etiology, resolved.  6. Other chronic medical problems as noted on the History and Physical.   DISCHARGE MEDICATIONS:  1. Synthroid 250 mcg  daily.  2. Prednisone on a tapering dosage: 60 mg daily for 2 days, then 40 mg daily     for 2 days, then 20 mg daily thereafter.  3. Avelox 400 mg daily for 6 more days.  4. Zestril 10 mg a day.  5. Aspirin 81 mg a day.  6. Paxil 40 mg a day.   FOLLOW UP:  Dr. Milinda Antis and also Dr. Phylliss Bob, both in a week or two.   FOCUS OF FOLLOW UP:  The patient will need a followup TSH and repeat  echocardiogram when she is euthyroid.   DISCHARGE INSTRUCTIONS:  1. She can return to work on Jun 19, 2003.  2. No specific limitations on diet or activity.  Sean A. Everardo All, M.D. [facility]    SAE/MEDQ  D:  06/10/2003  T:  06/11/2003  Job:  [id number]   cc:   Marne A. Milinda Antis, M.D. [facility]   Areatha Keas, M.D.  [address]  [address]  [place]  [place] [address]  Fax: [phone]

## 2010-06-21 NOTE — Procedures (Signed)
NAME:  [name], [name] NO.:  [id number]   MEDICAL RECORD NO.:  [id number]          PATIENT TYPE:  OUT   LOCATION:  SLEEP CENTER                 FACILITY:  [facility]   PHYSICIAN:  Barbaraann Share, MD,FCCPDATE OF BIRTH:  [birth year]03-1965   DATE OF STUDY:                            NOCTURNAL POLYSOMNOGRAM   REFERRING PHYSICIAN:   INDICATION FOR STUDY:  Hypersomnia with sleep apnea.   EPWORTH SLEEPINESS SCORE:  16.   SLEEP ARCHITECTURE:  The patient had a total sleep time of 309 minutes  with very little slow-wave sleep and never achieved REM.  Sleep onset  latency was normal at 11 minutes.  Sleep efficiency was decreased at  83%.   RESPIRATORY DATA:  The patient was found to 243 hypopneas with 152  obstructive apneas for an apnea/hypopnea index of 77 events per hour.  The patient slept most of the night in the supine position, and there  was very loud snoring noted throughout.   OXYGEN DATA:  The patient had O2 desaturation as low as 78% with her  obstructive events.   CARDIAC DATA:  No clinically significant cardiac arrhythmias were noted.   MOVEMENT-PARASOMNIA:  Small numbers of leg jerks without clinical  significance.   IMPRESSION/RECOMMENDATION:  Severe obstructive sleep apnea/hypopnea  syndrome with an apnea/hypopnea index of 77 events per hour and oxygen  desaturation as low as 78%.  Treatment for this degree of sleep apnea  should include aggressive weight loss as well as continuous positive  airway pressure.  The patient should also be encouraged to not drive if  she is sleepy.      Barbaraann Share, MD,FCCP  Diplomate, American Board of Sleep  Medicine  Electronically Signed     KMC/MEDQ  D:  05/12/2006 16:17:45  T:  05/12/2006 23:42:09  Job:  [id number]

## 2010-06-21 NOTE — Assessment & Plan Note (Signed)
[facility]                             PULMONARY OFFICE NOTE   NAME:COXNimrit, Kehres                           MRN:          [id number]  DATE:05/19/2006                            DOB:          [birth year]    REFERRING PHYSICIAN:  Dr. Shan Levans   I met Ms. Cox today for evaluation of her obstructive sleep apnea.   She had undergone an overnight polysomnogram on May 04, 2006, and this  showed an apnea+hypopnea index of 77 with an oxygen saturation of 78%  consistent with a diagnosis of severe obstructive sleep apnea.   She said she has had problems with her sleep for years and has always  felt tired, but this has gotten progressively worse recently after she  gained approximately 30 pounds over the last 6 months.  She also had  this brought to her attention by her father who has a history of sleep  apnea and is on CPAP therapy.  She says that over the Christmas  holidays, she was told that she snores quite loudly and was seen to  actually stop breathing while she was asleep.  She says that she will  fall asleep easily if she is sitting idly.  She tends to clench her  teeth at night as well as have a dry mouth at night.  She has difficulty  sleeping on her back, and she says she wakes up occasionally feeling  sweaty.  She goes to bed between 9 and 11.  She has no problems falling  asleep.  She wakes up anywhere from 3-6 times during the night to use  the bathroom.  She gets up between 4:30 and 5:00 in the morning and so  feels quite tired.  She says that she will sleep in later on the  weekends as well as take several cat naps on weekends without much  improvement in her symptoms of sleepiness.  She drinks Diet [facility]  as well as coffee constantly throughout the day to help her stay awake.  She has vivid dreams and does talk in her sleep.  She also gets creepy-  crawly feelings in her legs, typically in the evening time about 5-7  days a  week.  She says that this does cause some difficulty with her  falling asleep but that she is usually so tired that she is able to  overcome this.  There is no history of sleep hallucination, sleep  paralysis, cataplexy.  She is not currently using anything to help her  fall asleep at night.   PAST MEDICAL HISTORY:  1. Hypertension.  2. Elevated cholesterol.  3. DVT.  4. Factor V Leiden.  5. Seasonal allergies.  6. Lupus nephritis.  7. Systemic lupus erythematosus.  8. Graves' disease.  9. Panic attacks.  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  11.She has no known drug allergies.   CURRENT MEDICATIONS:  1. Aspirin 81 mg daily.  2. Paxil 40 mg daily.  3. Plaquenil 100 mg b.i.d.  4. Synthroid 3 mg daily.  5. Crestor 10 mg daily.  6. Micardis  80/25 once daily.  7. Lisinopril 20 mg daily.  8. Tylenol p.r.n.   SOCIAL HISTORY:  She quit smoking in 1995.  She is divorced.  She has 3  children.  She works as a Higher education careers adviser.  There is  no history of alcohol abuse.   FAMILY HISTORY:  Significant for a father who has sleep apnea and CPAP  therapy.   REVIEW OF SYSTEMS:  Essentially negative except for as stated above.   PHYSICAL EXAMINATION:  VITAL SIGNS:  She is 5 feet 5 inches tall, 362  pounds, temperature 98, blood pressure 110/72, heart rate 90, oxygen  saturation 94% on room air.  HEENT:  Pupils reactive.  There is no sinus tenderness.  No nasal  discharge.  She has a Mallapati IV airway with a large tongue low-lying  soft palate.  She has a moon facies.  She has no lymphadenopathy, no  thyromegaly.  HEART:  S1 and S2, regular rhythm.  CHEST:  Distant breath sounds but no wheezing or rales.  ABDOMEN:  Obese, soft, nontender.  EXTREMITIES:  She has folliculitis over her arms.  She has 1+ leg edema.  NEUROLOGIC:  No further deficits were appreciated.   IMPRESSION:  Severe obstructive sleep apnea demonstrated by an  apnea+hypopnea index of 77 with an oxygen saturation  of 78%.  I have  reviewed the results of her sleep study with her.  I discussed the  adverse consequences of untreated sleep apnea including increased risk  of hypertension, coronary artery disease, cerebrovascular disease,  diabetes, and arrhythmias.  I discussed the importance of diet,  exercise, and weight reduction as well as avoidance of alcohol and  sedatives.  Driving precautions were discussed with her as well.  I  reviewed various treatment options for her sleep apnea including CPAP  therapy, oral implants, and surgical intervention.  Given the severity  of her sleep apnea, I have recommended CPAP therapy.  I  have also made arrangements for her to undergo a CPAP titration study,  and I will call her with the results of this and initiate her on therapy  after this.     Coralyn Helling, MD  Electronically Signed    VS/MedQ  DD: 05/19/2006  DT: 05/19/2006  Job #: [id number]   cc:   Charlcie Cradle. Delford Field, MD, FCCP  Karie Schwalbe, MD  Audrie Gallus. Milinda Antis, MD  Terrial Rhodes, M.D.

## 2010-06-21 NOTE — Discharge Summary (Signed)
NAME:  [name], [name]                              ACCOUNT NO.:  [id number]   MEDICAL RECORD NO.:  [id number]                   PATIENT TYPE:  INP   LOCATION:  3710                                 FACILITY:  [facility]   PHYSICIAN:  Rene Paci, M.D. [facility]          DATE OF BIRTH:  1965-03-18   DATE OF ADMISSION:  06/07/2003  DATE OF DISCHARGE:  06/10/2003                                 DISCHARGE SUMMARY   DISCHARGE DIAGNOSES:  1. Staphylococcus aureus urinary tract infection.  2. Fever.  3. Leukopenia.  4. Tachycardia.  5. Hypotension.  6. Pericarditis.  7. Elevated TSH.   BRIEF ADMISSION HISTORY:  [name] is a 45 year old white female with history  of lupus.  She was at her rheumatologist's office on the day of admission.  She was there regarding arthritis.  There she complained of bilateral chest  pain and dyspnea.  Patient had been on treatment with prednisone since the  week before for a flare.  She had had symptoms of bad arthritis.  This has  improved.  The patient was referred to the emergency department for further  evaluation.  She states the chest pain was intense but lasting seconds to  minutes.  She denied any dyspnea on exertion, pleurisy, or positional pain.   PAST MEDICAL HISTORY:  1. Lupus since 1999.  2. Hypertension.  3. Hypercholesterolemia - stopped taking statins secondary to elevated LFTs.  4. Hypothyroidism.   HOSPITAL COURSE:  Problem 1. INFECTIOUS DISEASE.  The patient presented with  fever.  White count was low.  She also had tachycardia and hypotension  concerning for sepsis versus lupus flare versus pericarditis.  Urinalysis  was positive for urinary tract infection and did culture staph aureus.  The  patient was started on Avelox and we will have her complete a full 10-day  course of Avelox.  Currently she is afebrile.  Also her tachycardia has  resolved and her hypertension has corrected.   Problem 2. ACUTE RENAL FAILURE.  Patient presented with a  creatinine of 2.3.  We were concerned for a lupus nephritis.  However, the patient's renal  function corrected with hydration and we suspect she was prerenal from  dehydration secondary to a febrile illness.   Problem 3. RHEUMATOLOGY.  Patient was being treated for an acute versus  subacute flare of lupus.  Patient's sed rate was only minimally elevated in  the 40s.  The patient was treated with high-dose Solu-Medrol but this has  been tapered.   Problem 4. CARDIOVASCULAR.  The patient had atypical chest pain with  elevated troponins.  We were concerned about a cardiac etiology.  A 2-D echo  was obtained and was consistent with pericardial effusion and pericarditis.  This was likely the etiology of her elevated cardiac enzymes.  The patient  was seen in consultation by Dr. Myrtis Ser.  He did not feel that she had  bacterial  pericarditis and suspected this is likely secondary to a viral  illness or her inflammatory process secondary to lupus.  We also checked a D-  dimer which was elevated however, the patient's lower extremity venous  Dopplers were negative and her V/Q scan was negative.  Again D-dimer was  probably elevated from her pericarditis.   Problem 5. HYPOTHYROIDISM.  [name] TSH was significantly elevated at  21.669.  Patient's states that she has had a difficult time controlling her  thyroid.  She has alternated between 200 and 250 mcg of Synthroid in the  past.  We will increase her Synthroid.   LABORATORIES AT DISCHARGE:  White count was 2.5, hemoglobin 12.1, platelet  count 182, BUN 18, creatinine 0.9.  TSH was 21.669.   MEDICATIONS AT DISCHARGE:  1. Paxil 40 mg a day.  2. Synthroid 250 mcg once daily.  3. Aspirin 81 mg once daily.  4. Lisinopril 10 mg once daily.  5. Prednisone taper and Avelox for 6 more days.   FOLLOW UP:  Follow up with Drs. Rowe and Tower in the next 1-2 weeks.      Cornell Barman, P.A. [facility]                  Rene Paci, M.D. [facility]     LC/MEDQ  D:  06/09/2003  T:  06/10/2003  Job:  [id number]   cc:   Marne A. Milinda Antis, M.D. [facility]   Areatha Keas, M.D.  [address]  [address]  [place]  [place] [address]  Fax: [phone]   Willa Rough, M.D.

## 2010-06-21 NOTE — Consult Note (Signed)
NAME:  [name], [name]                              ACCOUNT NO.:  [id number]   MEDICAL RECORD NO.:  [id number]                   PATIENT TYPE:  INP   LOCATION:  3710                                 FACILITY:  [facility]   PHYSICIAN:  Willa Rough, M.D.                  DATE OF BIRTH:  [birth year]   DATE OF CONSULTATION:  06/08/2003  DATE OF DISCHARGE:                                   CONSULTATION   PRIMARY CARE PHYSICIAN:  Marne A. Milinda Antis, M.D.   PRIMARY RHEUMATOLOGIST:  Lemmie Evens, M.D.   PRIMARY CARDIOLOGIST:  New, Dr. Willa Rough, M.D.   CHIEF COMPLAINT:  Questionable pericarditis.   HISTORY OF PRESENT ILLNESS:  [name] is a 45 year old female with no known  history of coronary artery disease.  She saw her primary physician in March  of 2005 for left arm pain and received Motrin with resolution of her  symptoms.  She began complaining of joint aching approximately 4 days ago  and saw her primary care physician and was treated with p.o. steroids and  Motrin.  Her symptoms did not improve and she began having fevers and chills  and hot flashes.  She also began complaining of bilateral chest pain at the  lower edge of her ribs and this was described as a constant aching.  On Jun 07, 2003 she also began having substernal chest pain.  She saw Dr. Willeen Niece  and her heart rate was increased at the office at 111 on EKG and she also  had a fever.  She was sent to the emergency room for admission.  Since  admission she has received IV steroids which has helped her joint pains.  Her chest pain has also greatly decreased and now it is quiescent and only  occasionally felt.  When it is felt it is relieved by position changes.  In  general she is active without any formal exercise program with her job and  her 2 children, and she never experiences chest pain with this.  She has  some chronic dyspnea on exertion with climbing stairs and hills, but this  has not changed recently.  She has no  history of increased fatigue, until  her recent symptoms started.   PAST MEDICAL HISTORY:  1. Significant for lupus.  She has a history of hypertension.  2. History of some hyperlipidemia.  3. Coronary artery disease in grandparents but in no other relatives.  4. No history of tobacco use.  5. History of increased liver function tests especially after statin use     which was discontinued.  6. History of hypothyroidism.  7. Remote history of panic attacks which have been successfully treated and     prevented with Paxil.  8. History of deep venous thrombosis in her right leg 14 years ago.   SURGICAL HISTORY:  None.   ALLERGIES:  Intolerance to STATIN secondary to increased LFTs.   CURRENT MEDICATIONS:  1. She is on Solu-Medrol 125 mg IV q.8h.  2. Lisinopril 10 mg daily.  3. Synthroid 200 mcg daily.  4. Aspirin 81 mg daily.  5. Paxil 40 mg daily.  6. Avelox 400 mg IV daily.  7. Lovenox 40 mg SQ daily.   SOCIAL HISTORY:  She lives in [place] with her husband.  Works for  [employer].  She has 3 children.  She has no  significant history of tobacco use, ETOH or drug abuse.   FAMILY HISTORY:  Both of her parents are alive in their mid 27's and neither  has a history of coronary artery disease although her father has had a  transient ischemic attack in the past.  She has no siblings with coronary  artery disease.  One grandfather died of sudden death in his 58's, presumed  coronary artery disease, and another died in his 25's with a multi-year  history of coronary artery disease prior to this.   REVIEW OF SYSTEMS:  Positive for fevers, chills and sweats recently.  She  has chronic changes in the skin of her right lower extremity greater than  left, secondary to the DVT.  She has chest pain and shortness of breath as  described above.  She has daytime edema.  She has occasional tacky  palpitations.  She has occasions brief orthostatic dizziness.  She  has not  had a panic attack since she has been on the Paxil.  She has increased  reflux symptoms recently with increased Motrin use.  Review of systems is  otherwise negative.   PHYSICAL EXAMINATION:  VITAL SIGNS:  Temperature is 101.5.  Blood pressure  134/98, pulse 106, respiratory rate 28, and O2 saturation 96% on room air.  GENERAL:  She is a well-developed, obese white female in no acute distress.  HEENT:  Head is normocephalic, atraumatic.  Pupils are equal, round and  reactive to light and accommodation.  Extraocular movements are intact.  Sclerae are clear.  Nares without discharge.  NECK:  Supple and without lymphadenopathy, thyromegaly, bruit or JVD.  CARDIOVASCULAR:  Regular rate and rhythm, sounds questionably slightly  distant but not significantly so.  There is a clear S1 and S2 and there is  no significant murmur, rub, or gallop noted.  Her distal pulses are 2+ in  all 4 extremities.  LUNGS:  Clear to auscultation bilaterally.  SKIN:  Chronic stasis changes in left lower extremity.  ABDOMEN:  Obese, firm and nontender with active bowel sounds and no  hepatosplenomegaly noted by percussion.  EXTREMITIES:  No clubbing, cyanosis or edema.  MUSCULOSKELETAL:  Without deformity or effusion.  No spine or CVA  tenderness.  NEUROLOGIC:  She alert and oriented with Cranial nerves II-XII grossly  intact.  Strength 5/5 in all extremities.   A VQ scan is normal.   EKG shows sinus tachycardia at rate of 108 with flattened T waves in lead 3  only and no old EKG for comparison.   Echocardiogram - preliminary reading shows a small to moderate  circumferential effusion with no tamponade physiology.   LABORATORY DATA:  CK-MB negative x3 with troponin I of 0.12, 0.13, and 0.16.  TSH 21.669.  Calcium 7.1.  Urine culture positive for staph aureus.  Albumin 2.3.  D-dimer greater than 20.  Sed rate 40.  INR 0.9, PTT 27.  Sodium 133,  potassium 5.2, chloride 108, CO2 23, BUN 30, creatinine  1.5, glucose 109.  Hemoglobin 12.4, hematocrit 36.6, WBC 2.6, platelets 164,000.   ASSESSMENT AND PLAN:  Chest pain.  It is unlikely this is cardiac and Dr.  Myrtis Ser suspects that the increased troponin and chest pain is secondary to  pericarditis.  Pericarditis is probably part of the larger inflammatory  illness that she has, questionable lupus flare versus viral or both.  He  goes on to state further that he is worried about the staph bacteria in the  urine.  However at this time she does not have symptoms of acute bacterial  pericarditis.   RECOMMENDATIONS:  Recommendations are to treat the inflammatory process  staph, thyroid and renal and follow cardiac which we will be happy to do.  Further management of her medical problems is to be per primary care.   This is Theodore Demark, [facility] dictating for Dr. Willa Rough who saw the  patient and determined the plan of care.     Theodore Demark, P.A. [facility]                  Willa Rough, M.D.    RB/MEDQ  D:  06/08/2003  T:  06/09/2003  Job:  [id number]

## 2010-06-24 ENCOUNTER — Encounter: Payer: Self-pay | Admitting: Family Medicine

## 2010-06-25 ENCOUNTER — Ambulatory Visit: Payer: Managed Care, Other (non HMO) | Admitting: Family Medicine

## 2010-06-26 ENCOUNTER — Encounter: Payer: Self-pay | Admitting: Family Medicine

## 2010-06-26 ENCOUNTER — Ambulatory Visit (INDEPENDENT_AMBULATORY_CARE_PROVIDER_SITE_OTHER): Payer: Managed Care, Other (non HMO) | Admitting: Family Medicine

## 2010-06-26 DIAGNOSIS — L03115 Cellulitis of right lower limb: Secondary | ICD-10-CM

## 2010-06-26 DIAGNOSIS — J019 Acute sinusitis, unspecified: Secondary | ICD-10-CM

## 2010-06-26 DIAGNOSIS — L03119 Cellulitis of unspecified part of limb: Secondary | ICD-10-CM

## 2010-06-26 DIAGNOSIS — L02419 Cutaneous abscess of limb, unspecified: Secondary | ICD-10-CM

## 2010-06-26 MED ORDER — AZITHROMYCIN 250 MG PO TABS: ORAL_TABLET | ORAL | Status: AC

## 2010-06-26 NOTE — Progress Notes (Signed)
Subjective:    Patient ID: [name], female    DOB: [birth year], 45 y.o.   MRN: [id number]  HPI  Here for facial pain on L and red area on leg Also needs handicapped form  Wt is up 19 lb   Was having pain since wed of last week - headache over the L temple and then radiated under the eye  Face feels puffy  No more congested than usual  Has allergies this time of year Ear is fine  Throat fine  No cough   Recurrent empyema - breathing varies Is lupus related  Needs handicapped form  Saw gyn yesterday - colposcopy for hpv -- and was fine  bp was 140/100 --that is improved today  L ant leg is really sore and more red than usual  ? Infection No fever  Legs are usually sclerosed - but has an open area that will not heal  Started process to have bariatric surgery in sept -- seen at [place]  Her renal doctor urged her  Will be complicated  No bypass - no lap band -is a sleeve procedure   Past Medical History  Diagnosis Date  . Nonspecific abnormal results of liver function study   . Anxiety state, unspecified   . Personal history of venous thrombosis and embolism 2002    during pregnancy; ?factor 5 leiden (sees heme)  . Edema   . 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 Enlargement of lymph nodes   . Nephritis and nephropathy, not specified as acute or chronic, with unspecified pathological lesion in kidney   . Pure hypercholesterolemia   . Unspecified essential hypertension   . Other specified acquired hypothyroidism   . Obesity, unspecified   . Panic disorder without agoraphobia   . Unspecified pleural effusion   . Other pulmonary embolism and infarction   . 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 Pleural effusion 2005    c/w lupus initial w/u; recurrent Right as pred tapered off July 2011  . HLD (hyperlipidemia)   . Iron deficiency anemia     IV dextran Coladonato     History   Social History  . Marital Status: Divorced    Spouse Name: N/A    Number of Children: 3  . Years of Education: N/A   Occupational History  . Trainer at [employer]    Social History Main Topics  . Smoking status: Former Smoker    Quit date: 05/04/2009  . Smokeless tobacco: Not on file   Comment: Socially x10 years (1 pack/month)  . Alcohol Use: Not on file  . Drug Use: Not on file  . Sexually Active: Not on file   Other Topics Concern  . Not on file   Social History Narrative  . No narrative on file     Review of Systems Review of Systems  Constitutional: Negative for fever, appetite change, fatigue and unexpected weight change.  Eyes: Negative for pain and visual disturbance.  ENT pos for nasal congestion and facial pain  Respiratory: Negative for cough and pos for baseline sob  Cardiovascular: Negative for cp , pos for edema .   Gastrointestinal: Negative for nausea, diarrhea and constipation.  Genitourinary: Negative for urgency and frequency.  Skin: Negative for pallor. pos for redness  Neurological: Negative for weakness, light-headedness, numbness and headaches.  Hematological: Negative for adenopathy. Does  not bruise/bleed easily.  Psychiatric/Behavioral: Negative for dysphoric mood. The patient is not nervous/anxious.          Objective:   Physical Exam  Constitutional: She appears well-developed.       Morbidly obese and well appearing   HENT:  Head: Normocephalic and atraumatic.  Right Ear: External ear normal.  Left Ear: External ear normal.  Mouth/Throat: Oropharynx is clear and moist.       Nares are diffusely congested  Frontal and maxillary sinus tenderness on L  No temporal tenderness   Eyes: Conjunctivae and EOM are normal. Pupils are equal, round, and reactive to light. Right eye exhibits no discharge. Left eye exhibits no discharge.  Neck: Normal range of motion. Neck supple. No JVD present. Carotid bruit is not present. No  thyromegaly present.  Cardiovascular: Normal rate, regular rhythm, normal heart sounds and intact distal pulses.   Pulmonary/Chest: Effort normal and breath sounds normal.       Diffusely distant bs at bases  No rales   Abdominal: Soft. Bowel sounds are normal. She exhibits no distension. There is no tenderness. There is no rebound.  Musculoskeletal: She exhibits edema. She exhibits no tenderness.       Baseline firm edema with hyperpigmentation in both lower legs   Lymphadenopathy:    She has no cervical adenopathy.  Neurological: She is alert. Coordination normal.  Skin: Skin is warm and dry. There is erythema.       R lower leg - warm and with more erythema over shin today  slt tender No open skin or drainage  Psychiatric: She has a normal mood and affect.          Assessment & Plan:

## 2010-06-26 NOTE — Assessment & Plan Note (Signed)
With L facial/ maxillary pain and congestion tx with zithromax -- is all to pcn and has renal insuff Check INR next wk Nasal saline/ warm compreses If not imp- / investigate for TA

## 2010-06-26 NOTE — Patient Instructions (Signed)
Schedule PT/ INR for sometime next week -- antibiotic may increase your levels  Take zithromax for infection in leg and sinuses If not improved after the course - please let me know  Use nasal saline spray for congestion  I sent px to pharmacy

## 2010-06-26 NOTE — Assessment & Plan Note (Signed)
In area of poor venous return and scab tx with zithromax (renal insufff/ all to floxin and amox) Elevation/heat abx oint to scab Update if not imp or if fever or worse

## 2010-07-02 ENCOUNTER — Other Ambulatory Visit: Payer: Self-pay | Admitting: Critical Care Medicine

## 2010-07-05 ENCOUNTER — Ambulatory Visit: Payer: Managed Care, Other (non HMO)

## 2010-07-10 ENCOUNTER — Telehealth: Payer: Self-pay | Admitting: Critical Care Medicine

## 2010-07-10 MED ORDER — NEBIVOLOL HCL 10 MG PO TABS: 10.0000 mg | ORAL_TABLET | Freq: Every day | ORAL | Status: DC

## 2010-07-10 NOTE — Telephone Encounter (Signed)
Checking on status of her refill for bystolic 10mg .[name]

## 2010-07-10 NOTE — Telephone Encounter (Signed)
Per epic, refills were sent to [facility] on [address] on 5.29.12 #30 with 6 additional refills.  Called spoke with pharm tech who states that they did not receive these refills, but that Dr. Maurice March had just recently called in #10 for pt.  Refill authorization given over the phone for #30 with 6 additional refills.    Called spoke with patient, apologized for the miscommunication b/t computer systems and her pending refills telephoned to [facility] [address].  Pt okay with this and verbalized her understanding.  Med list updated w/ refills on this date.

## 2010-07-26 ENCOUNTER — Ambulatory Visit (INDEPENDENT_AMBULATORY_CARE_PROVIDER_SITE_OTHER): Payer: Managed Care, Other (non HMO) | Admitting: Family Medicine

## 2010-07-26 DIAGNOSIS — Z5181 Encounter for therapeutic drug level monitoring: Secondary | ICD-10-CM

## 2010-07-26 DIAGNOSIS — I2699 Other pulmonary embolism without acute cor pulmonale: Secondary | ICD-10-CM

## 2010-07-26 DIAGNOSIS — Z7901 Long term (current) use of anticoagulants: Secondary | ICD-10-CM

## 2010-07-26 LAB — POCT INR: INR: 2.1

## 2010-07-26 NOTE — Patient Instructions (Signed)
Continue current dose, check in 4 weeks  

## 2010-08-23 ENCOUNTER — Ambulatory Visit (INDEPENDENT_AMBULATORY_CARE_PROVIDER_SITE_OTHER): Payer: Managed Care, Other (non HMO) | Admitting: Family Medicine

## 2010-08-23 DIAGNOSIS — I2699 Other pulmonary embolism without acute cor pulmonale: Secondary | ICD-10-CM

## 2010-08-23 DIAGNOSIS — Z5181 Encounter for therapeutic drug level monitoring: Secondary | ICD-10-CM

## 2010-08-23 DIAGNOSIS — Z7901 Long term (current) use of anticoagulants: Secondary | ICD-10-CM

## 2010-08-23 NOTE — Patient Instructions (Signed)
Patient scheduled for bypass end of August. Continue current dose, check in 4 weeks or patient will call with surgery info.

## 2010-09-02 ENCOUNTER — Other Ambulatory Visit: Payer: Self-pay | Admitting: *Deleted

## 2010-09-02 MED ORDER — OMEPRAZOLE 20 MG PO CPDR: 20.0000 mg | DELAYED_RELEASE_CAPSULE | Freq: Every day | ORAL | Status: DC

## 2010-09-02 MED ORDER — PAROXETINE HCL 40 MG PO TABS: 40.0000 mg | ORAL_TABLET | ORAL | Status: DC

## 2010-09-02 NOTE — Telephone Encounter (Signed)
Last refill 08/04/2010

## 2010-09-02 NOTE — Telephone Encounter (Signed)
Will refil electronically  

## 2010-09-04 HISTORY — PX: OTHER SURGICAL HISTORY: SHX169

## 2010-09-16 ENCOUNTER — Telehealth: Payer: Self-pay | Admitting: Critical Care Medicine

## 2010-09-16 ENCOUNTER — Encounter: Payer: Self-pay | Admitting: Family Medicine

## 2010-09-16 ENCOUNTER — Telehealth: Payer: Self-pay | Admitting: Family Medicine

## 2010-09-16 ENCOUNTER — Ambulatory Visit (INDEPENDENT_AMBULATORY_CARE_PROVIDER_SITE_OTHER): Payer: Managed Care, Other (non HMO) | Admitting: Family Medicine

## 2010-09-16 DIAGNOSIS — R7309 Other abnormal glucose: Secondary | ICD-10-CM

## 2010-09-16 DIAGNOSIS — E038 Other specified hypothyroidism: Secondary | ICD-10-CM

## 2010-09-16 DIAGNOSIS — Z5181 Encounter for therapeutic drug level monitoring: Secondary | ICD-10-CM

## 2010-09-16 DIAGNOSIS — I2699 Other pulmonary embolism without acute cor pulmonale: Secondary | ICD-10-CM

## 2010-09-16 DIAGNOSIS — L03115 Cellulitis of right lower limb: Secondary | ICD-10-CM

## 2010-09-16 DIAGNOSIS — Z7901 Long term (current) use of anticoagulants: Secondary | ICD-10-CM

## 2010-09-16 DIAGNOSIS — Z86718 Personal history of other venous thrombosis and embolism: Secondary | ICD-10-CM

## 2010-09-16 DIAGNOSIS — R7303 Prediabetes: Secondary | ICD-10-CM | POA: Insufficient documentation

## 2010-09-16 DIAGNOSIS — L02419 Cutaneous abscess of limb, unspecified: Secondary | ICD-10-CM

## 2010-09-16 DIAGNOSIS — E7800 Pure hypercholesterolemia, unspecified: Secondary | ICD-10-CM

## 2010-09-16 DIAGNOSIS — Z01818 Encounter for other preprocedural examination: Secondary | ICD-10-CM

## 2010-09-16 DIAGNOSIS — R739 Hyperglycemia, unspecified: Secondary | ICD-10-CM

## 2010-09-16 DIAGNOSIS — I10 Essential (primary) hypertension: Secondary | ICD-10-CM

## 2010-09-16 NOTE — Assessment & Plan Note (Signed)
DVT and PE in past -- vena cava filter was put in and pt will have lovenox bridge from surgeon for her surgery

## 2010-09-16 NOTE — Telephone Encounter (Signed)
Dr Milinda Antis had a cancellation this afternoon and Lyla Son contacted pt and pt will see Dr Milinda Antis this afternoon.

## 2010-09-16 NOTE — Assessment & Plan Note (Signed)
bp stable at 132/70 today  No change in medication

## 2010-09-16 NOTE — Telephone Encounter (Signed)
Pt. Called to ask if she can be seen by tomorrow for clearance for gastric bypass surgery.  She was called on Friday afternoon and told she needed this appointment.  Her surgery is scheduled for 10/01/10.  She said she had an EKG done @ [facility] last week.  She's going to check w/ Dr.Wright's office to make sure he has cleared her for surgery. Please advise.

## 2010-09-16 NOTE — Assessment & Plan Note (Signed)
Improved after abx - no problems

## 2010-09-16 NOTE — Assessment & Plan Note (Signed)
a1c today  Disc imp of wt loss for hyperglycemia and she is already starting to loose on the pre surgical diet

## 2010-09-16 NOTE — Progress Notes (Signed)
Subjective:    Patient ID: [name], female    DOB: 01-31-1966, 45 y.o.   MRN: [id number]  HPI Here for visit for surgical clearance for bariatric surgery   Has it planned 8/28 Will be having the sleeve gastrectomy  Is excited to get that done  Dr Adolphus Birchwood   She is already on the preop diet to be used to what she is doing  Immunologist protein shakes with skim milk Seeing nutritionist  Down 7 lb   Is starting exercise on the days she can  Lupus has not been bad at all lately from a joint perspective   Leg infection is better    Renal doctor is on board and they recommended the procedure to begin with  Will get clearance from their appt as well   Has appt with pulm for surg clearance 8/22 She has hx of recurrent pulm eff from lupus  Does have hx of DVT and PE in the past  Did put in vena cava filter  Plans to do lovenox ahead of time -- she goes to their lab at [facility] to talk about the bridge plan    Hypothyroid-- no changes but due for a check   Hyperglycemia  Has not been checked   Anemia -- no longer a problem   Anesthesia  Has had general anethesia with her lung surgery- she did fine  Had to use smaller ET tube   She does have sleep apnea and her surgeons are aware   No allergies to local anethsia   Already did her EKG at [facility] within the past week - was normal sinus rhythm- no acute changes   Patient Active Problem List  Diagnoses  . HYPOTHYROIDISM, POSTABLATION  . HYPERCHOLESTEROLEMIA  . OBESITY  . ANXIETY  . PANIC DISORDER  . HYPERTENSION, ESSENTIAL NOS  . PULMONARY EMBOLISM  . EMPYEMA  . PLEURAL EFFUSION  . GLOMERULONEPHRITIS  . SYSTEMIC LUPUS ERYTHEMATOSUS  . EDEMA  . ENLARGEMENT OF LYMPH NODES  . ABNORMAL RESULT, FUNCTION STUDY, LIVER  . DVT, HX OF  . Acute sinusitis  . Cellulitis of right leg  . Hyperglycemia  . Pre-operative examination   Past Medical History  Diagnosis Date  . Nonspecific abnormal results of liver  function study   . Anxiety state, unspecified   . Personal history of venous thrombosis and embolism 2002    during pregnancy; ?factor 5 leiden (sees heme)  . Edema   . 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 Enlargement of lymph nodes   . Nephritis and nephropathy, not specified as acute or chronic, with unspecified pathological lesion in kidney   . Pure hypercholesterolemia   . Unspecified essential hypertension   . Other specified acquired hypothyroidism   . Obesity, unspecified   . Panic disorder without agoraphobia   . Unspecified pleural effusion   . Other pulmonary embolism and infarction   . 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 Pleural effusion 2005    c/w lupus initial w/u; recurrent Right as pred tapered off July 2011  . HLD (hyperlipidemia)   . Iron deficiency anemia     IV dextran Coladonato   Past Surgical History  Procedure Date  . Pleuryx catheter placement 9/11     VanTright----removed 9/11  . Thoracentesis 2010    with penumonia  . Pleurocentesis 10/2004   History  Substance  Use Topics  . Smoking status: Former Smoker    Quit date: 05/04/2009  . Smokeless tobacco: Not on file   Comment: Socially x10 years (1 pack/month)  . Alcohol Use: Not on file   Family History  Problem Relation Age of Onset  . Lupus Sister   . Diabetes      GM   Allergies  Allergen Reactions  . Amoxicillin     REACTION: Pt. reports allergy, reaction not known  . Atorvastatin     REACTION: Reaction not known  . Moxifloxacin     REACTION: hallucinations   Current Outpatient Prescriptions on File Prior to Visit  Medication Sig Dispense Refill  . aspirin 81 MG tablet Take 81 mg by mouth daily.        . clobetasol (TEMOVATE) 0.05 % ointment Apply 1 application topically as needed.        . hydroxychloroquine (PLAQUENIL) 200 MG tablet Take by mouth daily.       Marland Kitchen levothyroxine  (SYNTHROID, LEVOTHROID) 200 MCG tablet Take 200 mcg by mouth daily.        . nebivolol (BYSTOLIC) 10 MG tablet Take 1 tablet (10 mg total) by mouth daily.  30 tablet  6  . omeprazole (PRILOSEC) 20 MG capsule Take 1 capsule (20 mg total) by mouth daily.  30 capsule  6  . PARoxetine (PAXIL) 40 MG tablet Take 1 tablet (40 mg total) by mouth every morning.  30 tablet  11  . predniSONE (DELTASONE) 10 MG tablet pack Take 10 mg by mouth daily.        . pregabalin (LYRICA) 75 MG capsule Take 75 mg by mouth daily.        Marland Kitchen warfarin (COUMADIN) 7.5 MG tablet Take 7.5 mg by mouth as directed.        Marland Kitchen acetaminophen (TYLENOL) 500 MG tablet Take 500 mg by mouth every 6 (six) hours as needed.        . ALPRAZolam (XANAX) 0.5 MG tablet Take 0.5 mg by mouth 2 (two) times daily as needed.        Marland Kitchen levothyroxine (SYNTHROID, LEVOTHROID) 25 MCG tablet Take 25 mcg by mouth daily.        . predniSONE (DELTASONE) 1 MG tablet Take 3 mg by mouth daily.             Review of Systems Review of Systems  Constitutional: Negative for fever, appetite change, fatigue and unexpected weight change.  Eyes: Negative for pain and visual disturbance.  Respiratory: Negative for cough and shortness of breath.   Cardiovascular: Negative.  for cp or palpitations / pos for chronic leg edema Gastrointestinal: Negative for nausea, diarrhea and constipation.  Genitourinary: Negative for urgency and frequency.  Skin: Negative for pallor. or rash  MSK pos for occ joint pain from lupus  Neurological: Negative for weakness, light-headedness, numbness and headaches.  Hematological: Negative for adenopathy. Does not bruise/bleed easily.  Psychiatric/Behavioral: Negative for dysphoric mood. The patient is not nervous/anxious.          Objective:   Physical Exam  Constitutional: She appears well-developed and well-nourished. No distress.       Morbidly obese and well appearing  HENT:  Head: Normocephalic and atraumatic.  Right Ear:  External ear normal.  Left Ear: External ear normal.  Nose: Nose normal.  Mouth/Throat: Oropharynx is clear and moist.  Eyes: Conjunctivae and EOM are normal. Pupils are equal, round, and reactive to light. No scleral icterus.  Neck: Normal  range of motion. Neck supple. No JVD present. Carotid bruit is not present. No thyromegaly present.  Cardiovascular: Normal rate, regular rhythm, normal heart sounds and intact distal pulses.  Exam reveals no gallop and no friction rub.   Pulmonary/Chest: Effort normal and breath sounds normal. No respiratory distress. She has no wheezes. She has no rales. She exhibits no tenderness.       Diffusely distant bs   Abdominal: Soft. Bowel sounds are normal. She exhibits no distension, no abdominal bruit and no mass. There is no tenderness. There is no guarding.  Musculoskeletal: She exhibits edema. She exhibits no tenderness.       Baseline firm lymphedema bilat lower legs  Lymphadenopathy:    She has no cervical adenopathy.  Neurological: She is alert. She has normal reflexes. No cranial nerve deficit. Coordination normal.  Skin: Skin is warm and dry. No rash noted. There is erythema. No pallor.       Baseline erythema with edema in bilat lower legs No change from usual  Psychiatric: She has a normal mood and affect.          Assessment & Plan:

## 2010-09-16 NOTE — Patient Instructions (Signed)
Please get clearance from pulmonary for your surgery (as well as renal )  From our perspective - you are clear for surgery (had examination for bariatric surgery today, I support it and clear her from a general medical perspective)  Follow directions from [facility] for lovenox bridging  If any problems let me know  Sugar and thyroid labs today

## 2010-09-16 NOTE — Assessment & Plan Note (Signed)
In pt with SLE that has affected renal fxn and lungs (with pulm effusions)  Lab today Had EKG at [facility] already  Will see pulm for clearance on 8/22 and she will call renal office From our perspective - is clear for surgery (pending clearance from other docs )  She is motivated and feeling good

## 2010-09-16 NOTE — Telephone Encounter (Signed)
I looked through her chart  EKG is done- [facility]  Needs to make sure she is cleared by pulmonary and also renal  Her bp here last time was ok - but it was a sick visit  Needs to have a plan for the coumadin If she needs to see me - put her on for the 4:15 slot tomorrow (unless something else is open)

## 2010-09-16 NOTE — Assessment & Plan Note (Signed)
Overdue for tsh today  Check that No clinical changes

## 2010-09-16 NOTE — Progress Notes (Signed)
Contacted [facility] in [facility] re: copy of recent EKG faxed to Dr Milinda Antis [phone]. [facility] faxed copy of 09/05/10 EKG and is on your shelf in the in box.

## 2010-09-16 NOTE — Telephone Encounter (Signed)
Pt is coming in 8/22 at 9:30 for surgical clearance

## 2010-09-20 ENCOUNTER — Ambulatory Visit: Payer: Managed Care, Other (non HMO)

## 2010-09-23 ENCOUNTER — Institutional Professional Consult (permissible substitution): Payer: Managed Care, Other (non HMO) | Admitting: Family Medicine

## 2010-09-25 ENCOUNTER — Ambulatory Visit (INDEPENDENT_AMBULATORY_CARE_PROVIDER_SITE_OTHER): Payer: Managed Care, Other (non HMO) | Admitting: Critical Care Medicine

## 2010-09-25 ENCOUNTER — Ambulatory Visit (INDEPENDENT_AMBULATORY_CARE_PROVIDER_SITE_OTHER)
Admission: RE | Admit: 2010-09-25 | Discharge: 2010-09-25 | Disposition: A | Discharge status: Home / Self Care | Payer: Managed Care, Other (non HMO) | Source: Ambulatory Visit | Attending: Critical Care Medicine | Admitting: Critical Care Medicine

## 2010-09-25 ENCOUNTER — Encounter: Payer: Self-pay | Admitting: Critical Care Medicine

## 2010-09-25 VITALS — BP 124/76 | HR 65 | Temp 98.2°F | Ht 65.5 in | Wt 377.0 lb

## 2010-09-25 DIAGNOSIS — J90 Pleural effusion, not elsewhere classified: Secondary | ICD-10-CM

## 2010-09-25 DIAGNOSIS — I2699 Other pulmonary embolism without acute cor pulmonale: Secondary | ICD-10-CM

## 2010-09-25 NOTE — Assessment & Plan Note (Signed)
Stable loculated L pleural effusion d/t lupus and prior empyema resolved. This pt can be cleared for planned bariatric surgery IVC filter in place, lovenox bridging planned

## 2010-09-25 NOTE — Patient Instructions (Signed)
You are cleared for surgery A chest xray will be obtained Return later after your surgery and recovery is complete at [facility]

## 2010-09-25 NOTE — Progress Notes (Signed)
Subjective:    Patient ID: [name], female    DOB: 08/01/1965, 45 y.o.   MRN: [id number]  HPI 45 y.o. wf last smoked around 06/2009 with chronic L empyema s/p VATS 2010.  February 07, 2010 10:21 AM  dyspnea is better. able to walk farther . had sl congestion. coughed up some mucus.  nasal stuffiness is noted and nose is dry  no real chest pain  no fever now, dental issues are better   09/25/2010 Since last ov not much dyspnea or cough.  No f/c/s.  Wants bariatric surgery (sleeve procedure, gastrectomy).  IVC filter placed proph for PE/DVT Still on coumadin and is to bridge to lovenox No chest pain.  Will feel muscle tightness.  No recent chest xray done Dr Adolphus Birchwood with [facility]  Past Medical History  Diagnosis Date  . Nonspecific abnormal results of liver function study   . Anxiety state, unspecified   . Personal history of venous thrombosis and embolism 2002    during pregnancy; ?factor 5 leiden (sees heme)  . Edema   . 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 Enlargement of lymph nodes   . Nephritis and nephropathy, not specified as acute or chronic, with unspecified pathological lesion in kidney   . Pure hypercholesterolemia   . Unspecified essential hypertension   . Other specified acquired hypothyroidism   . Obesity, unspecified   . Panic disorder without agoraphobia   . Unspecified pleural effusion   . Other pulmonary embolism and infarction   . 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 Pleural effusion 2005    c/w lupus initial w/u; recurrent Right as pred tapered off July 2011  . HLD (hyperlipidemia)   . Iron deficiency anemia     IV dextran Coladonato     Family History  Problem Relation Age of Onset  . Lupus Sister   . Diabetes      GM     History   Social History  . Marital Status: Divorced    Spouse Name: N/A    Number of Children: 3  . Years of  Education: N/A   Occupational History  . Trainer at [employer]    Social History Main Topics  . Smoking status: Former Smoker -- 0.2 packs/day for 10 years    Types: Cigarettes    Quit date: 05/04/2009  . Smokeless tobacco: Never Used   Comment: Socially x10 years (1 pack/month)  . Alcohol Use: Not on file  . Drug Use: Not on file  . Sexually Active: Not on file   Other Topics Concern  . Not on file   Social History Narrative  . No narrative on file     Allergies  Allergen Reactions  . Amoxicillin     REACTION: Pt. reports allergy, reaction not known  . Atorvastatin     REACTION: Reaction not known  . Moxifloxacin     REACTION: hallucinations     Outpatient Prescriptions Prior to Visit  Medication Sig Dispense Refill  . acetaminophen (TYLENOL) 500 MG tablet Take 500 mg by mouth every 6 (six) hours as needed.        Marland Kitchen aspirin 81 MG tablet Take 81 mg by mouth daily.        . clobetasol (TEMOVATE) 0.05 % ointment Apply 1 application topically as needed.        Marland Kitchen  furosemide (LASIX) 40 MG tablet Take 20 mg by mouth daily.       . hydroxychloroquine (PLAQUENIL) 200 MG tablet Take by mouth daily.       Marland Kitchen levothyroxine (SYNTHROID, LEVOTHROID) 200 MCG tablet Take 200 mcg by mouth daily.        . nebivolol (BYSTOLIC) 10 MG tablet Take 1 tablet (10 mg total) by mouth daily.  30 tablet  6  . omeprazole (PRILOSEC) 20 MG capsule Take 1 capsule (20 mg total) by mouth daily.  30 capsule  6  . PARoxetine (PAXIL) 40 MG tablet Take 1 tablet (40 mg total) by mouth every morning.  30 tablet  11  . predniSONE (DELTASONE) 10 MG tablet pack Take 10 mg by mouth daily.        . pregabalin (LYRICA) 75 MG capsule Take 75 mg by mouth daily.        Marland Kitchen warfarin (COUMADIN) 7.5 MG tablet Take 7.5 mg by mouth as directed.        Marland Kitchen ALPRAZolam (XANAX) 0.5 MG tablet Take 0.5 mg by mouth 2 (two) times daily as needed.        Marland Kitchen levothyroxine (SYNTHROID, LEVOTHROID) 25 MCG tablet Take 25 mcg by mouth  daily.        . predniSONE (DELTASONE) 1 MG tablet Take 3 mg by mouth daily.           Review of Systems Constitutional:   No  weight loss, night sweats,  Fevers, chills, fatigue, lassitude. HEENT:   No headaches,  Difficulty swallowing,  Tooth/dental problems,  Sore throat,                No sneezing, itching, ear ache, nasal congestion, post nasal drip,   CV:  No chest pain,  Orthopnea, PND, swelling in lower extremities, anasarca, dizziness, palpitations  GI  No heartburn, indigestion, abdominal pain, nausea, vomiting, diarrhea, change in bowel habits, loss of appetite  Resp: No shortness of breath with exertion or at rest.  No excess mucus, no productive cough,  No non-productive cough,  No coughing up of blood.  No change in color of mucus.  No wheezing.  No chest wall deformity  Skin: no rash or lesions.  GU: no dysuria, change in color of urine, no urgency or frequency.  No flank pain.  MS:  No joint pain or swelling.  No decreased range of motion.  No back pain.  Psych:  No change in mood or affect. No depression or anxiety.  No memory loss.     Objective:   Physical Exam  Filed Vitals:   09/25/10 0946  BP: 124/76  Pulse: 65  Temp: 98.2 F (36.8 C)  TempSrc: Oral  Height: 5' 5.5" (1.664 m)  Weight: 377 lb (171.006 kg)  SpO2: 96%    Gen: Pleasant, well-nourished, in no distress,  normal affect  ENT: No lesions,  mouth clear,  oropharynx clear, no postnasal drip  Neck: No JVD, no TMG, no carotid bruits  Lungs: No use of accessory muscles, no dullness to percussion, decreased BS 1/2 way up on L  Cardiovascular: RRR, heart sounds normal, no murmur or gallops, no peripheral edema  Abdomen: soft and NT, no HSM,  BS normal  Musculoskeletal: No deformities, no cyanosis or clubbing  Neuro: alert, non focal  Skin: Warm, no lesions or rashes  8/22 CXR IMPRESSION:  Grossly unchanged chest radiograph including moderate to large  sized left-sided pleural  effusion.  Assessment & Plan:   PLEURAL EFFUSION Stable loculated L pleural effusion d/t lupus and prior empyema resolved. This pt can be cleared for planned bariatric surgery IVC filter in place, lovenox bridging planned   PULMONARY EMBOLISM IVC filter in place, on coumadin for lovenox bridging    Updated Medication List Outpatient Encounter Prescriptions as of 09/25/2010  Medication Sig Dispense Refill  . acetaminophen (TYLENOL) 500 MG tablet Take 500 mg by mouth every 6 (six) hours as needed.        Marland Kitchen aspirin 81 MG tablet Take 81 mg by mouth daily.        . clobetasol (TEMOVATE) 0.05 % ointment Apply 1 application topically as needed.        . furosemide (LASIX) 40 MG tablet Take 20 mg by mouth daily.       . hydroxychloroquine (PLAQUENIL) 200 MG tablet Take by mouth daily.       Marland Kitchen levothyroxine (SYNTHROID, LEVOTHROID) 200 MCG tablet Take 200 mcg by mouth daily.        . nebivolol (BYSTOLIC) 10 MG tablet Take 1 tablet (10 mg total) by mouth daily.  30 tablet  6  . omeprazole (PRILOSEC) 20 MG capsule Take 1 capsule (20 mg total) by mouth daily.  30 capsule  6  . PARoxetine (PAXIL) 40 MG tablet Take 1 tablet (40 mg total) by mouth every morning.  30 tablet  11  . predniSONE (DELTASONE) 10 MG tablet pack Take 10 mg by mouth daily.        . pregabalin (LYRICA) 75 MG capsule Take 75 mg by mouth daily.        Marland Kitchen warfarin (COUMADIN) 7.5 MG tablet Take 7.5 mg by mouth as directed.        Marland Kitchen DISCONTD: ALPRAZolam (XANAX) 0.5 MG tablet Take 0.5 mg by mouth 2 (two) times daily as needed.        Marland Kitchen DISCONTD: levothyroxine (SYNTHROID, LEVOTHROID) 25 MCG tablet Take 25 mcg by mouth daily.        Marland Kitchen DISCONTD: predniSONE (DELTASONE) 1 MG tablet Take 3 mg by mouth daily.

## 2010-09-25 NOTE — Assessment & Plan Note (Signed)
IVC filter in place, on coumadin for lovenox bridging

## 2010-09-26 NOTE — Progress Notes (Signed)
Quick Note:  Called, spoke with pt. She is aware CXR stable, and she is cleared for surgery per PW. She verbalized understanding of this and voiced no further questions/concerns at this time. ______

## 2010-10-18 ENCOUNTER — Ambulatory Visit (INDEPENDENT_AMBULATORY_CARE_PROVIDER_SITE_OTHER): Payer: Managed Care, Other (non HMO) | Admitting: Family Medicine

## 2010-10-18 ENCOUNTER — Encounter: Payer: Self-pay | Admitting: Family Medicine

## 2010-10-18 VITALS — BP 126/82 | HR 82 | Temp 98.0°F | Wt 355.5 lb

## 2010-10-18 DIAGNOSIS — R7309 Other abnormal glucose: Secondary | ICD-10-CM

## 2010-10-18 DIAGNOSIS — R739 Hyperglycemia, unspecified: Secondary | ICD-10-CM

## 2010-10-18 DIAGNOSIS — Z9889 Other specified postprocedural states: Secondary | ICD-10-CM

## 2010-10-18 DIAGNOSIS — I10 Essential (primary) hypertension: Secondary | ICD-10-CM

## 2010-10-18 DIAGNOSIS — E7800 Pure hypercholesterolemia, unspecified: Secondary | ICD-10-CM

## 2010-10-18 MED ORDER — MUPIROCIN 2 % EX OINT: TOPICAL_OINTMENT | CUTANEOUS | Status: DC

## 2010-10-18 NOTE — Assessment & Plan Note (Signed)
From gastric sleeve procedure - doing very well (Dr Adolphus Birchwood) She has one wound /incision that is open - about 1 cm with healthy looking granulation tissue and no signs of infection Adv to stop using peroxide to clean it and to use antibacterial soap and water (do not submerge) and also given px for bactroban oint  Will have checked by surgeon later this month If worse in the meantime adv to call us or surgeon

## 2010-10-18 NOTE — Patient Instructions (Signed)
I'm glad you are doing so well  Keep up the exercise - advance as tolerated Wash incisions with antibacterial soap and water  Watch for pus/ increased redness or pain - alert your surgeon For open area - do not use peroxide and start applying bactroban ointment twice daily Keep lightly covered No baths/ swimming - until healed Follow up with your surgeron at the end of the month  Follow up with me in 3 months

## 2010-10-18 NOTE — Assessment & Plan Note (Signed)
Doing well and stable after bariatric surgery with 22 lb wt loss so far Doing very well! Will continue to monitor F/u 3 mo

## 2010-10-18 NOTE — Assessment & Plan Note (Signed)
Should imp with diet - check at 3 mo f/u

## 2010-10-18 NOTE — Progress Notes (Signed)
Subjective:    Patient ID: [name], female    DOB: [birth year], 45 y.o.   MRN: [id number]  HPI Here for f/u after bariatric surgery It went very well and easy - not much pain or medicine  Had the sleeve - with Dr Adolphus Birchwood   Lost 22 lb from last visit She does not have scale at home   Is adjusting to the diet  Struggles to get enough protien -- is needing 80 mg per day  Cannot eat and drink at the same time- is interesting   HTN in good control - no changes  No cp or headache or palpitatoins  No change in meds after surgery  Much less sugar in diet - expects she will have much better sugar on next check   Finished liquids  Last week told she could add shakes and puddings and soup  Gets full quickly No vomiting   One incision still looks a little funny  Is a little open and drains a bit of clear fluid on bandage Not painful No fever  Took out staples the first week   Reviewed her hosp/surgical notes in detail  (they are off to be scanned now )  Does not see Dr Adolphus Birchwood until the end of this month  Lupus has been ok overall   Still has some intermittent L chest wall pain after pericardial effusion  Took lyrica for a while and stopped it  Doing ok now - no more pain so she stopped the lyrica   Patient Active Problem List  Diagnoses  . HYPOTHYROIDISM, POSTABLATION  . HYPERCHOLESTEROLEMIA  . OBESITY  . ANXIETY  . PANIC DISORDER  . HYPERTENSION, ESSENTIAL NOS  . PULMONARY EMBOLISM  . PLEURAL EFFUSION  . GLOMERULONEPHRITIS  . SYSTEMIC LUPUS ERYTHEMATOSUS  . EDEMA  . ENLARGEMENT OF LYMPH NODES  . DVT, HX OF  . Cellulitis of right leg  . Hyperglycemia  . Post-operative state   Past Medical History  Diagnosis Date  . Nonspecific abnormal results of liver function study   . Anxiety state, unspecified   . Personal history of venous thrombosis and embolism 2002    during pregnancy; ?factor 5 leiden (sees heme)  . Edema   . 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 Enlargement of lymph nodes   . Nephritis and nephropathy, not specified as acute or chronic, with unspecified pathological lesion in kidney   . Pure hypercholesterolemia   . Unspecified essential hypertension   . Other specified acquired hypothyroidism   . Obesity, unspecified   . Panic disorder without agoraphobia   . Unspecified pleural effusion   . Other pulmonary embolism and infarction   . 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 Pleural effusion 2005    c/w lupus initial w/u; recurrent Right as pred tapered off July 2011  . HLD (hyperlipidemia)   . Iron deficiency anemia     IV dextran Coladonato   Past Surgical History  Procedure Date  . Pleuryx catheter placement 9/11     VanTright----removed 9/11  . Thoracentesis 2010    with penumonia  . Pleurocentesis 10/2004  . Gastric sleeve 8/12    bariatric surgery Dr Adolphus Birchwood    History  Substance Use Topics  . Smoking status: Former Smoker -- 0.2 packs/day for 10 years    Types: Cigarettes    Quit date: 05/04/2009  .  Smokeless tobacco: Never Used   Comment: Socially x10 years (1 pack/month)  . Alcohol Use: Not on file   Family History  Problem Relation Age of Onset  . Lupus Sister   . Diabetes      GM   Allergies  Allergen Reactions  . Amoxicillin     REACTION: Pt. reports allergy, reaction not known  . Atorvastatin     REACTION: Reaction not known  . Moxifloxacin     REACTION: hallucinations   Current Outpatient Prescriptions on File Prior to Visit  Medication Sig Dispense Refill  . acetaminophen (TYLENOL) 500 MG tablet Take 500 mg by mouth every 6 (six) hours as needed.        . hydroxychloroquine (PLAQUENIL) 200 MG tablet Take by mouth daily.       Marland Kitchen levothyroxine (SYNTHROID, LEVOTHROID) 200 MCG tablet Take 200 mcg by mouth daily.        Marland Kitchen omeprazole (PRILOSEC) 20 MG capsule Take 1 capsule (20 mg total) by mouth  daily.  30 capsule  6  . PARoxetine (PAXIL) 40 MG tablet Take 1 tablet (40 mg total) by mouth every morning.  30 tablet  11  . predniSONE (DELTASONE) 10 MG tablet pack Take 10 mg by mouth daily.        Marland Kitchen warfarin (COUMADIN) 7.5 MG tablet Take 7.5 mg by mouth as directed.        . clobetasol (TEMOVATE) 0.05 % ointment Apply 1 application topically as needed.        . furosemide (LASIX) 40 MG tablet Take 20 mg by mouth daily.       . nebivolol (BYSTOLIC) 10 MG tablet Take 1 tablet (10 mg total) by mouth daily.  30 tablet  6  . pregabalin (LYRICA) 75 MG capsule Take 75 mg by mouth daily.              Review of Systems Review of Systems  Constitutional: Negative for fever, appetite change, fatigue and unexpected weight change.  Eyes: Negative for pain and visual disturbance.  Respiratory: Negative for cough and shortness of breath.   Cardiovascular: Negative for cp or palpitations    Gastrointestinal: Negative for nausea, diarrhea and constipation.  Genitourinary: Negative for urgency and frequency.  Skin: Negative for pallor or rash   Neurological: Negative for weakness, light-headedness, numbness and headaches.  Hematological: Negative for adenopathy. Does not bruise/bleed easily.  Psychiatric/Behavioral: Negative for dysphoric mood. The patient is not nervous/anxious.          Objective:   Physical Exam  Constitutional: She appears well-developed and well-nourished. No distress.       Obese and in no distress - wt loss noted   HENT:  Head: Normocephalic and atraumatic.  Mouth/Throat: Oropharynx is clear and moist.  Eyes: Conjunctivae and EOM are normal. Pupils are equal, round, and reactive to light.  Neck: Normal range of motion. Neck supple. No JVD present. Carotid bruit is not present. No thyromegaly present.  Cardiovascular: Normal rate, regular rhythm, normal heart sounds and intact distal pulses.   Pulmonary/Chest: Effort normal and breath sounds normal. No respiratory  distress. She has no wheezes.       bs are distant at both bases   Abdominal: Soft. Bowel sounds are normal. She exhibits no distension and no mass. There is no tenderness.  Musculoskeletal: Normal range of motion. She exhibits no edema and no tenderness.  Lymphadenopathy:    She has no cervical adenopathy.  Neurological: She is alert. She  has normal reflexes. No cranial nerve deficit. Coordination normal.  Skin: Skin is warm and dry. No rash noted. No erythema. No pallor.       L abdomen - one incision that is open to diameter of 1 cm with healing granulation tissue , no signs of infection  Other incisions are closed and look to be healing well  Psychiatric: She has a normal mood and affect.          Assessment & Plan:

## 2010-10-18 NOTE — Assessment & Plan Note (Signed)
With 22 lb wt loss expect improvement  Will check a1c at next visit

## 2010-11-08 ENCOUNTER — Ambulatory Visit (INDEPENDENT_AMBULATORY_CARE_PROVIDER_SITE_OTHER): Payer: Managed Care, Other (non HMO) | Admitting: Family Medicine

## 2010-11-08 DIAGNOSIS — Z7901 Long term (current) use of anticoagulants: Secondary | ICD-10-CM

## 2010-11-08 DIAGNOSIS — I2699 Other pulmonary embolism without acute cor pulmonale: Secondary | ICD-10-CM

## 2010-11-08 DIAGNOSIS — Z5181 Encounter for therapeutic drug level monitoring: Secondary | ICD-10-CM

## 2010-11-08 LAB — POCT INR: INR: 3.3

## 2010-11-08 NOTE — Patient Instructions (Signed)
Continue current dose, check in 4 weeks  

## 2010-11-09 ENCOUNTER — Other Ambulatory Visit: Payer: Self-pay | Admitting: Critical Care Medicine

## 2010-11-11 ENCOUNTER — Ambulatory Visit: Payer: Managed Care, Other (non HMO)

## 2010-11-12 ENCOUNTER — Other Ambulatory Visit: Payer: Self-pay | Admitting: *Deleted

## 2010-11-12 MED ORDER — WARFARIN SODIUM 7.5 MG PO TABS: 7.5000 mg | ORAL_TABLET | ORAL | Status: DC

## 2010-11-13 NOTE — Telephone Encounter (Signed)
Please advise if ok to fill pred taper for this pt.  thanks

## 2010-11-14 NOTE — Telephone Encounter (Signed)
Ok to refill 

## 2010-11-14 NOTE — Telephone Encounter (Signed)
RX sent on 11/09/2010 by Dr. Delford Field. Verified this with the pharmacy.

## 2010-11-21 ENCOUNTER — Other Ambulatory Visit: Payer: Self-pay | Admitting: Critical Care Medicine

## 2010-12-02 ENCOUNTER — Encounter (HOSPITAL_COMMUNITY)
Admission: RE | Admit: 2010-12-02 | Discharge: 2010-12-02 | Disposition: A | Discharge status: Home / Self Care | Payer: Managed Care, Other (non HMO) | Source: Ambulatory Visit | Attending: Obstetrics and Gynecology | Admitting: Obstetrics and Gynecology

## 2010-12-02 ENCOUNTER — Encounter (HOSPITAL_COMMUNITY): Payer: Self-pay

## 2010-12-02 HISTORY — DX: Unspecified osteoarthritis, unspecified site: M19.90

## 2010-12-02 HISTORY — DX: Sleep apnea, unspecified: G47.30

## 2010-12-02 HISTORY — DX: Gastro-esophageal reflux disease without esophagitis: K21.9

## 2010-12-02 LAB — COMPREHENSIVE METABOLIC PANEL
ALT: 17 U/L (ref 0–35)
Albumin: 3.4 g/dL — ABNORMAL LOW (ref 3.5–5.2)
Alkaline Phosphatase: 86 U/L (ref 39–117)
Glucose, Bld: 87 mg/dL (ref 70–99)
Potassium: 4.6 mEq/L (ref 3.5–5.1)
Sodium: 135 mEq/L (ref 135–145)
Total Protein: 7 g/dL (ref 6.0–8.3)

## 2010-12-02 LAB — CBC
MCV: 85 fL (ref 78.0–100.0)
Platelets: 202 10*3/uL (ref 150–400)
RBC: 4.6 MIL/uL (ref 3.87–5.11)
WBC: 6.7 10*3/uL (ref 4.0–10.5)

## 2010-12-02 LAB — SURGICAL PCR SCREEN: MRSA, PCR: NEGATIVE

## 2010-12-02 NOTE — Anesthesia Preprocedure Evaluation (Addendum)
Anesthesia Evaluation  Patient identified by MRN, date of birth, ID band Patient awake  General Assessment Comment  Reviewed: Allergy & Precautions, H&P , NPO status , Patient's Chart, lab work & pertinent test results  History of Anesthesia Complications (+) DIFFICULT AIRWAY  Airway Mallampati: IV TM Distance: >3 FB Neck ROM: Limited    Dental  (+) Chipped,    Pulmonary sleep apnea and Continuous Positive Airway Pressure Ventilation former smoker clear to auscultation  Pulmonary exam normal       Cardiovascular hypertension, Pt. on medications Regular Normal Patient has [place] IVC Filter   Neuro/Psych PSYCHIATRIC DISORDERS Anxiety Anxiety/Panic Attacks with Agoraphobia Negative Neurological ROS     GI/Hepatic negative GI ROS Neg liver ROS  GERD Medicated and ControlledS/P Gastric Sleeve   Endo/Other  Negative Endocrine ROSHypothyroidism Morbid obesityHx/o Graves Disease, s/p RAI Possible Scarring of trachea Hyperlipidemia  Renal/GU negative Renal ROSSLE with Renal involvement  Genitourinary negative   Musculoskeletal negative musculoskeletal ROS (+)   Abdominal (+) obese,  Abdomen: soft.    Peds  Hematology Hx/o Factor V Leiden Deficiency, chronically on Coumadin for DVT's and hx/o PTE   Anesthesia Other Findings   Reproductive/Obstetrics negative OB ROS                         Anesthesia Physical Anesthesia Plan  ASA: III  Anesthesia Plan: General   Post-op Pain Management:    Induction: Intravenous and Cricoid pressure planned  Airway Management Planned: Oral ETT and Video Laryngoscope Planned  Additional Equipment:   Intra-op Plan:   Post-operative Plan: Extubation in OR  Informed Consent: I have reviewed the patients History and Physical, chart, labs and discussed the procedure including the risks, benefits and alternatives for the proposed anesthesia with the patient  or authorized representative who has indicated his/her understanding and acceptance.   Dental advisory given  Plan Discussed with: CRNA, Anesthesiologist and Surgeon  Anesthesia Plan Comments: (Use Glidescope for intubation. Use 6.0 ETT for intubation. She has a hx/o difficulty with placement of larger ETT. No Toradol because of gastric sleeve.Consider IV Acetaminophen.)       Anesthesia Quick Evaluation

## 2010-12-02 NOTE — Patient Instructions (Addendum)
   Your procedure is scheduled on:  Monday, Nov. 5th at 7:30am  Enter through the Main Entrance of [facility] at: 6:00am Pick up the phone at the desk and dial [phone] and inform us of your arrival.  Please call this number if you have any problems the morning of surgery: [phone]  Remember: Do not eat food after midnight: Sunday Do not drink clear liquids after: Sunday Take these medicines the morning of surgery with a SIP OF WATER:  Prilosec, actigall, synthroid, paxil, prednisone  Do not wear jewelry, make-up, or FINGER nail polish Do not wear lotions, powders, or perfumes.   Do not shave 48 hours prior to surgery. Do not bring valuables to the hospital.  Patients discharged on the day of surgery will not be allowed to drive home.   Name and phone number of your driver: son Marlinda Mike  Remember to use your hibiclens as instructed.Please shower with 1/2 bottle the evening before your surgery and the other 1/2 bottle the morning of surgery.

## 2010-12-02 NOTE — Pre-Procedure Instructions (Signed)
Dr Malen Gauze saw pt in [facility].

## 2010-12-04 NOTE — H&P (Signed)
  Patient name  [name] DICTATION#  Disconnected before getting number CSN# [id number]  Juluis Mire, MD 12/04/2010 12:18 PM

## 2010-12-05 NOTE — H&P (Signed)
NAME:  Dimiceli, Ariyonna                     ACCOUNT NO.:  MEDICAL RECORD NO.:  LOCATION:                                 FACILITY:  PHYSICIAN:  Juluis Mire, M.D.        DATE OF BIRTH:  DATE OF ADMISSION:  12/04/2010 DATE OF DISCHARGE:                             HISTORY & PHYSICAL   HISTORY:  The patient is a 45 year old, gravida 3, para 3 female, presents for laparoscopic bilateral tubal ligation, hysteroscopy and NovaSure ablation.  IN RELATION TO THE PRESENT ADMISSION:  Cycles are regular at the present time.  The last 7 days 5 days being heavy, changing pads every 3 hours with clots and increasing dysmenorrhea.  It is of note that she is homozygous for factor V Leiden mutation, has a history of deep venous thrombosis after pregnancy and a previous pulmonary embolus.  She is on Coumadin at the present time that is adding to her heavy cycles.  We did do an ultrasound evaluation, findings highly suggestive of adenomyosis. Because of the ablation we are going to proceed with laparoscopic tubal at the same time.  IN TERMS OF ALLERGIES:  She is allergic to PENICILLIN, AVELOX, LISINOPRIL.  MEDICATIONS: 1. Prilosec 20 mg a day. 2. Actigall 2 times a day. 3. Prednisone 10 mg once per day. 4. Synthroid 200 mcg a day. 5. Paxil 40 mg a day. 6. Plaquenil 200 mg twice a day. 7. Warfarin 7.5 mg looks like 1-1/2 times per day.  PAST MEDICAL HISTORY:  Significant in that she does have a history of deep venous thrombosis and pulmonary embolus and homozygous for factor V Leiden mutation, is on Coumadin at the present time as previously noted. She also has a history of systemic lupus erythematosus.  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  History of hypertension, hypothyroidism followed by Dr. Milinda Antis, history of depression and anxiety, history of sleep apnea and moderate obesity.  PAST SURGICAL HISTORY:  She has had 3 previous thoracic surgery in 2009 and 2011  evidently for an empyema.  Had agreeing filter put in and August of this year.  She has had 3 vaginal deliveries.  SOCIAL HISTORY:  Reveals occasional alcohol.  No tobacco use.  FAMILY HISTORY:  There is a history of breast cancer, hypertension, diabetes.  REVIEW OF SYSTEMS:  Noncontributory.  PHYSICAL EXAMINATION:  VITAL SIGNS:  The patient is afebrile, stable vital signs. HEENT:  The patient is normocephalic.  Pupils equal, round, reactive to light and accommodation.  Extraocular movements were intact.  Sclerae and conjunctivae clear.  Oropharynx clear. NECK:  Without thyromegaly. BREASTS:  Not examined. LUNGS:  Clear. CARDIOVASCULAR SYSTEM:  Regular rate.  There are no murmurs or gallops. No carotid or abdominal bruits. ABDOMINAL:  She has previous incisions from gastric bypass surgery. Otherwise, benign.  She is moderately obese. PELVIC:  External genitalia, vaginal mucosa clear, cervix unremarkable usual size, shape, and contour.  Adnexa free of mass or tenderness. EXTREMITIES:  Trace edema. NEUROLOGIC:  Grossly normal limits.  IMPRESSION: 1. Menorrhagia. 2. Desires sterility. 3. Homozygous for factor V Leiden deficiency. 4. History of lupus. 5. Nephritis. 6. Hypertension. 7.  Hypothyroidism.  PLAN:  The patient will undergo diagnostic laparoscopy, bilateral tubal fulguration.  Potential irreversibility of sterilization explained. Failure rate of 1/200 is quoted.  Failures can be in the form of ectopic pregnancy requiring further surgical management.  Success rates for ablation are quoted as 80-85%, continued bleeding can be an issue.  Risk of both procedures have been discussed including the risk of infection. The risk of vascular injury that could lead to hemorrhage requiring transfusion with the risk of AIDS, or hepatitis, excessive bleeding during, hysteroscopy could require hysterectomy.  There is a risk of injury to adjacent organs, either to laparoscopy or  perforation during hysteroscopy, this can include bladder bowel ureters, this can require further exploratory surgery.  Risk of deep venous thrombosis and pulmonary embolus.  The patient will be remained that maintained on Coumadin I think Dr. Malen Gauze is going to lower the dose on Friday, but we will continue that afterwards.  Again she understand potential risks, benefits, and alternatives.     Juluis Mire, M.D.     JSM/MEDQ  D:  12/04/2010  T:  12/04/2010  Job:  [id number]

## 2010-12-06 ENCOUNTER — Ambulatory Visit: Payer: Managed Care, Other (non HMO)

## 2010-12-09 ENCOUNTER — Ambulatory Visit (HOSPITAL_COMMUNITY): Payer: Managed Care, Other (non HMO) | Admitting: Registered Nurse

## 2010-12-09 ENCOUNTER — Encounter (HOSPITAL_COMMUNITY)
Admission: RE | Disposition: A | Discharge status: Home / Self Care | Payer: Self-pay | Source: Ambulatory Visit | Attending: Obstetrics and Gynecology

## 2010-12-09 ENCOUNTER — Encounter (HOSPITAL_COMMUNITY): Payer: Self-pay | Admitting: *Deleted

## 2010-12-09 ENCOUNTER — Ambulatory Visit (HOSPITAL_COMMUNITY)
Admission: RE | Admit: 2010-12-09 | Discharge: 2010-12-09 | Disposition: A | Discharge status: Home / Self Care | Payer: Managed Care, Other (non HMO) | Source: Ambulatory Visit | Attending: Obstetrics and Gynecology | Admitting: Obstetrics and Gynecology

## 2010-12-09 ENCOUNTER — Other Ambulatory Visit: Payer: Self-pay | Admitting: Obstetrics and Gynecology

## 2010-12-09 ENCOUNTER — Encounter (HOSPITAL_COMMUNITY): Payer: Self-pay | Admitting: Anesthesiology

## 2010-12-09 DIAGNOSIS — F410 Panic disorder [episodic paroxysmal anxiety] without agoraphobia: Secondary | ICD-10-CM

## 2010-12-09 DIAGNOSIS — L03115 Cellulitis of right lower limb: Secondary | ICD-10-CM

## 2010-12-09 DIAGNOSIS — I10 Essential (primary) hypertension: Secondary | ICD-10-CM | POA: Insufficient documentation

## 2010-12-09 DIAGNOSIS — E669 Obesity, unspecified: Secondary | ICD-10-CM

## 2010-12-09 DIAGNOSIS — N920 Excessive and frequent menstruation with regular cycle: Secondary | ICD-10-CM | POA: Insufficient documentation

## 2010-12-09 DIAGNOSIS — N059 Unspecified nephritic syndrome with unspecified morphologic changes: Secondary | ICD-10-CM

## 2010-12-09 DIAGNOSIS — R609 Edema, unspecified: Secondary | ICD-10-CM

## 2010-12-09 DIAGNOSIS — E7800 Pure hypercholesterolemia, unspecified: Secondary | ICD-10-CM

## 2010-12-09 DIAGNOSIS — R739 Hyperglycemia, unspecified: Secondary | ICD-10-CM

## 2010-12-09 DIAGNOSIS — Z9889 Other specified postprocedural states: Secondary | ICD-10-CM

## 2010-12-09 DIAGNOSIS — Z01818 Encounter for other preprocedural examination: Secondary | ICD-10-CM | POA: Insufficient documentation

## 2010-12-09 DIAGNOSIS — Z86718 Personal history of other venous thrombosis and embolism: Secondary | ICD-10-CM

## 2010-12-09 DIAGNOSIS — Z01812 Encounter for preprocedural laboratory examination: Secondary | ICD-10-CM | POA: Insufficient documentation

## 2010-12-09 DIAGNOSIS — R599 Enlarged lymph nodes, unspecified: Secondary | ICD-10-CM

## 2010-12-09 DIAGNOSIS — D6859 Other primary thrombophilia: Secondary | ICD-10-CM | POA: Insufficient documentation

## 2010-12-09 DIAGNOSIS — I2699 Other pulmonary embolism without acute cor pulmonale: Secondary | ICD-10-CM

## 2010-12-09 DIAGNOSIS — F411 Generalized anxiety disorder: Secondary | ICD-10-CM

## 2010-12-09 DIAGNOSIS — E038 Other specified hypothyroidism: Secondary | ICD-10-CM

## 2010-12-09 DIAGNOSIS — M329 Systemic lupus erythematosus, unspecified: Secondary | ICD-10-CM

## 2010-12-09 DIAGNOSIS — J90 Pleural effusion, not elsewhere classified: Secondary | ICD-10-CM

## 2010-12-09 HISTORY — PX: LAPAROSCOPIC TUBAL LIGATION: SHX1937

## 2010-12-09 LAB — CBC
MCH: 26.6 pg (ref 26.0–34.0)
MCHC: 31.5 g/dL (ref 30.0–36.0)
Platelets: 196 10*3/uL (ref 150–400)
RBC: 4.81 MIL/uL (ref 3.87–5.11)

## 2010-12-09 SURGERY — LIGATION, FALLOPIAN TUBE, LAPAROSCOPIC
Anesthesia: General | Site: Vagina | Wound class: Clean Contaminated

## 2010-12-09 MED ORDER — ONDANSETRON HCL 4 MG/2ML IJ SOLN
INTRAMUSCULAR | Status: DC | PRN
  Administered 2010-12-09: 4 mg via INTRAVENOUS

## 2010-12-09 MED ORDER — FENTANYL CITRATE 0.05 MG/ML IJ SOLN
INTRAMUSCULAR | Status: DC | PRN
  Administered 2010-12-09: 100 ug via INTRAVENOUS

## 2010-12-09 MED ORDER — OXYCODONE-ACETAMINOPHEN 5-325 MG PO TABS
ORAL_TABLET | ORAL | Status: AC
  Administered 2010-12-09: 1 via ORAL
  Filled 2010-12-09: qty 1

## 2010-12-09 MED ORDER — ROCURONIUM BROMIDE 50 MG/5ML IV SOLN
INTRAVENOUS | Status: AC
  Filled 2010-12-09: qty 1

## 2010-12-09 MED ORDER — MIDAZOLAM HCL 5 MG/5ML IJ SOLN
INTRAMUSCULAR | Status: DC | PRN
  Administered 2010-12-09: 1 mg via INTRAVENOUS

## 2010-12-09 MED ORDER — PROPOFOL 10 MG/ML IV EMUL
INTRAVENOUS | Status: DC | PRN
  Administered 2010-12-09: 200 mg via INTRAVENOUS
  Administered 2010-12-09: 100 mg via INTRAVENOUS

## 2010-12-09 MED ORDER — MIDAZOLAM HCL 2 MG/2ML IJ SOLN
INTRAMUSCULAR | Status: AC
  Filled 2010-12-09: qty 2

## 2010-12-09 MED ORDER — PROMETHAZINE HCL 25 MG/ML IJ SOLN: 6.2500 mg | INTRAMUSCULAR | Status: DC | PRN

## 2010-12-09 MED ORDER — PROPOFOL 10 MG/ML IV EMUL
INTRAVENOUS | Status: AC
  Filled 2010-12-09: qty 20

## 2010-12-09 MED ORDER — LIDOCAINE-EPINEPHRINE (PF) 1 %-1:200000 IJ SOLN
INTRAMUSCULAR | Status: DC | PRN
  Administered 2010-12-09: 10 mL

## 2010-12-09 MED ORDER — ENOXAPARIN SODIUM 40 MG/0.4ML ~~LOC~~ SOLN
40.0000 mg | Freq: Once | SUBCUTANEOUS | Status: AC
  Administered 2010-12-09: 40 mg via SUBCUTANEOUS
  Filled 2010-12-09: qty 0.4

## 2010-12-09 MED ORDER — NEOSTIGMINE METHYLSULFATE 1 MG/ML IJ SOLN
INTRAMUSCULAR | Status: AC
  Filled 2010-12-09: qty 10

## 2010-12-09 MED ORDER — BUPIVACAINE HCL (PF) 0.25 % IJ SOLN
INTRAMUSCULAR | Status: DC | PRN
  Administered 2010-12-09: 10 mL

## 2010-12-09 MED ORDER — ONDANSETRON HCL 4 MG/2ML IJ SOLN
INTRAMUSCULAR | Status: AC
  Filled 2010-12-09: qty 2

## 2010-12-09 MED ORDER — GLYCOPYRROLATE 0.2 MG/ML IJ SOLN
INTRAMUSCULAR | Status: DC | PRN
  Administered 2010-12-09: .4 mg via INTRAVENOUS

## 2010-12-09 MED ORDER — FENTANYL CITRATE 0.05 MG/ML IJ SOLN
INTRAMUSCULAR | Status: AC
  Filled 2010-12-09: qty 5

## 2010-12-09 MED ORDER — OXYCODONE-ACETAMINOPHEN 5-325 MG PO TABS
1.0000 | ORAL_TABLET | ORAL | Status: DC | PRN
  Administered 2010-12-09: 1 via ORAL

## 2010-12-09 MED ORDER — LIDOCAINE HCL (CARDIAC) 20 MG/ML IV SOLN
INTRAVENOUS | Status: DC | PRN
  Administered 2010-12-09: 100 mg via INTRAVENOUS

## 2010-12-09 MED ORDER — GLYCOPYRROLATE 0.2 MG/ML IJ SOLN
INTRAMUSCULAR | Status: AC
  Filled 2010-12-09: qty 2

## 2010-12-09 MED ORDER — KETAMINE HCL 100 MG/ML IJ SOLN
INTRAMUSCULAR | Status: AC
  Filled 2010-12-09: qty 1

## 2010-12-09 MED ORDER — FENTANYL CITRATE 0.05 MG/ML IJ SOLN: 25.0000 ug | INTRAMUSCULAR | Status: DC | PRN

## 2010-12-09 MED ORDER — SUCCINYLCHOLINE CHLORIDE 20 MG/ML IJ SOLN
INTRAMUSCULAR | Status: DC | PRN
  Administered 2010-12-09: 100 mg via INTRAVENOUS

## 2010-12-09 MED ORDER — MEPERIDINE HCL 25 MG/ML IJ SOLN: 6.2500 mg | INTRAMUSCULAR | Status: DC | PRN

## 2010-12-09 MED ORDER — LACTATED RINGERS IV SOLN
INTRAVENOUS | Status: DC | PRN
  Administered 2010-12-09: 3000 via INTRAUTERINE

## 2010-12-09 MED ORDER — DEXAMETHASONE SODIUM PHOSPHATE 4 MG/ML IJ SOLN
INTRAMUSCULAR | Status: DC | PRN
  Administered 2010-12-09: 10 mg via INTRAVENOUS

## 2010-12-09 MED ORDER — LIDOCAINE HCL (CARDIAC) 20 MG/ML IV SOLN
INTRAVENOUS | Status: AC
  Filled 2010-12-09: qty 5

## 2010-12-09 MED ORDER — SUCCINYLCHOLINE CHLORIDE 20 MG/ML IJ SOLN
INTRAMUSCULAR | Status: AC
  Filled 2010-12-09: qty 1

## 2010-12-09 MED ORDER — KETOROLAC TROMETHAMINE 30 MG/ML IJ SOLN
INTRAMUSCULAR | Status: AC
  Filled 2010-12-09: qty 1

## 2010-12-09 MED ORDER — DEXAMETHASONE SODIUM PHOSPHATE 10 MG/ML IJ SOLN
INTRAMUSCULAR | Status: AC
  Filled 2010-12-09: qty 1

## 2010-12-09 MED ORDER — LACTATED RINGERS IV SOLN
INTRAVENOUS | Status: DC
  Administered 2010-12-09: 07:00:00 via INTRAVENOUS

## 2010-12-09 SURGICAL SUPPLY — 20 items
ABLATOR ENDOMETRIAL BIPOLAR (ABLATOR) ×3 IMPLANT
CATH ROBINSON RED A/P 16FR (CATHETERS) ×3 IMPLANT
CLOTH BEACON ORANGE TIMEOUT ST (SAFETY) ×3 IMPLANT
CONTAINER PREFILL 10% NBF 60ML (FORM) ×6 IMPLANT
GLOVE BIO SURGEON STRL SZ7 (GLOVE) ×6 IMPLANT
GOWN PREVENTION PLUS LG XLONG (DISPOSABLE) ×3 IMPLANT
NEEDLE INSUFFLATION 14GA 120MM (NEEDLE) IMPLANT
NEEDLE SPNL 18GX3.5 QUINCKE PK (NEEDLE) ×3 IMPLANT
PACK HYSTEROSCOPY LF (CUSTOM PROCEDURE TRAY) ×3 IMPLANT
PACK LAPAROSCOPY BASIN (CUSTOM PROCEDURE TRAY) ×3 IMPLANT
STRIP CLOSURE SKIN 1/4X3 (GAUZE/BANDAGES/DRESSINGS) ×3 IMPLANT
SUT VIC AB 3-0 FS2 27 (SUTURE) ×3 IMPLANT
SUT VICRYL 0 UR6 27IN ABS (SUTURE) IMPLANT
SYR CONTROL 10ML LL (SYRINGE) ×3 IMPLANT
TOWEL OR 17X24 6PK STRL BLUE (TOWEL DISPOSABLE) ×6 IMPLANT
TROCAR BALLN 12MMX100 BLUNT (TROCAR) IMPLANT
TROCAR Z-THREAD BLADED 11X100M (TROCAR) ×3 IMPLANT
TROCAR Z-THREAD BLADED 5X100MM (TROCAR) ×3 IMPLANT
WARMER LAPAROSCOPE (MISCELLANEOUS) ×3 IMPLANT
WATER STERILE IRR 1000ML POUR (IV SOLUTION) ×3 IMPLANT

## 2010-12-09 NOTE — H&P (Signed)
  History unchanged.  Physical exam unchanged 

## 2010-12-09 NOTE — Anesthesia Procedure Notes (Addendum)
Procedure Name: Intubation Date/Time: 12/09/2010 7:35 AM Performed by: Harriett Rush, Kamali Sakata ADEDAYO Pre-anesthesia Checklist: Patient identified, Timeout performed, Emergency Drugs available, Suction available and Patient being monitored Patient Re-evaluated:Patient Re-evaluated prior to inductionOxygen Delivery Method: Circle System Utilized Preoxygenation: Pre-oxygenation with 100% oxygen (for  4 minutes) Intubation Type: IV induction and Circoid Pressure applied Ventilation: Two handed mask ventilation required and Oral airway inserted - appropriate to patient size Laryngoscope size: Glide scope #4, then #3 withe better view. Grade View: Grade IV Tube type: Oral Number of attempts: 2 Airway Equipment and Method: patient positioned with wedge pillow,  stylet,  video-laryngoscopy and oral airway Placement Confirmation: ETT inserted through vocal cords under direct vision,  positive ETCO2,  CO2 detector and breath sounds checked- equal and bilateral Secured at: 24 cm Tube secured with: Tape (clear tape) Dental Injury: Teeth and Oropharynx as per pre-operative assessment  Difficulty Due To: Difficulty was anticipated Future Recommendations: Recommend- induction with short-acting agent, and alternative techniques readily available

## 2010-12-09 NOTE — Op Note (Signed)
NAMEMELITA, [name] NO.:  [id number]  MEDICAL RECORD NO.:  [id number]  LOCATION:                                 FACILITY:  PHYSICIAN:  Juluis Mire, M.D.   DATE OF BIRTH:  1965/02/16  DATE OF PROCEDURE:  12/09/2010 DATE OF DISCHARGE:                              OPERATIVE REPORT   PREOPERATIVE DIAGNOSES: 1. Menorrhagia. 2. Desires sterility.  POSTOPERATIVE DIAGNOSES: 1. Menorrhagia. 2. Desires sterility.  PROCEDURE:  Hysteroscopy with endometrial curettings.  NovaSure ablation.  Failed attempt at a laparoscopic tubal.  SURGEONS:  Juluis Mire, MD  ANESTHESIA:  General endotracheal.  ESTIMATED BLOOD LOSS:  Minimal.  PACKS AND DRAINS:  None.  INTRAOPERATIVE BLOOD PLACED:  None.  COMPLICATIONS:  None.  INDICATIONS:  Dictated history and physical.  PROCEDURE IN DETAIL:  The patient was taken to the OR and placed in supine position.  After satisfactory level of general endotracheal anesthesia was obtained, the patient was placed in dorsal lithotomy position using the Allen stirrups.  The abdomen, perineum, vagina prepped out with Betadine and draped sterile field.  Speculum was placed in the vaginal vault.  The cervix grasped with a single-tooth tenaculum. Uterus sounded to 12 cm,  endocervical length was 5.5, given [place] a cavity length of 6.5 cm.  Hysteroscopy was performed.  Visualization revealed a normal endometrial cavity.  No polyps or abnormalities.  Endometrial curettings were obtained.  Total deficit was 95 mL.  At this point in time, the NovaSure was deployed.  We had a cavity width of 4.7 cm.  We passed the CO2 patency test.  We ablated for 64 seconds at a power setting of 168.  NovaSure was removed and intact.  Repeat hysteroscopy revealed universal ablation.  There was no signs of perforation or other complications, again deficit was 95 mL.  At this point in time, the [place]ka sac was put in place and the single tenaculum was then  removed.   The subumbilical incision made with knife, extended through subcutaneous tissue.  Fascia was entered sharply, incision fashioned general laterally.  Attempt at perineum, we found that there were omental adhesions around the umbilicus.  We were unable to enter safely, therefore we discontinued the attempts.  We did place the open laparoscopic trocar, did distend with CO2 ______ with a laparoscope and again it looked like she had omentum around the umbilicus.  Therefore, we did not proceed with further attempts.  Fascia closed with figure-of- eight of 0 Vicryl subcu with a suture of 0 plain catgut.  Skin with interrupted sutures of 4-0 Vicryl.  At this point in time, the H[place]ka tenaculum removed.  The patient taken out of dorsal lithotomy position. Once alert and extubated, transferred to recovery room in good condition.  Sponge, instrument, and needle counts were reported as correct by circulating nurse x2.     Juluis Mire, M.D.     JSM/MEDQ  D:  12/09/2010  T:  12/09/2010  Job:  [id number]

## 2010-12-09 NOTE — Transfer of Care (Signed)
Immediate Anesthesia Transfer of Care Note  Patient: [name]  Procedure(s) Performed:  LAPAROSCOPIC TUBAL LIGATION - Attempted see Nursing note; DILATATION & CURETTAGE/HYSTEROSCOPY WITH NOVASURE ABLATION  Patient Location: PACU  Anesthesia Type: General  Level of Consciousness: awake, alert  and oriented  Airway & Oxygen Therapy: Patient Spontanous Breathing and Patient connected to nasal cannula oxygen  Post-op Assessment: Report given to PACU RN, Post -op Vital signs reviewed and stable and Patient moving all extremities  Post vital signs: Reviewed and stable  Complications: No apparent anesthesia complications

## 2010-12-09 NOTE — Brief Op Note (Signed)
12/09/2010  8:45 AM  PATIENT:  [name]  45 y.o. female  PRE-OPERATIVE DIAGNOSIS:  desires sterilization, menorrhagia  POST-OPERATIVE DIAGNOSIS:  desires sterilization, menorrhagia  PROCEDURE:  Procedure(s): Failed laproscopic tubal DILATATION & CURETTAGE/HYSTEROSCOPY WITH NOVASURE ABLATION  SURGEON:  Surgeon(s): Duy Lemming S Madisson Kulaga  PHYSICIAN ASSISTANT:   ASSISTANTS: none   ANESTHESIA:   general  EBL:  Total I/O In: 1000 [I.V.:1000] Out: -   BLOOD ADMINISTERED:none  DRAINS: none   LOCAL MEDICATIONS USED:  OTHER nesicaine 5 cc  SPECIMEN:  Source of Specimen:  endometrial currettings  DISPOSITION OF SPECIMEN:  PATHOLOGY  COUNTS:  YES  TOURNIQUET:  * No tourniquets in log *  DICTATION: .Other Dictation: Dictation Number [id number]  PLAN OF CARE: Discharge to home after PACU  PATIENT DISPOSITION:  PACU - hemodynamically stable.   Delay start of Pharmacological VTE agent (>24hrs) due to surgical blood loss or risk of bleeding:  no

## 2010-12-09 NOTE — Anesthesia Postprocedure Evaluation (Signed)
  Anesthesia Post-op Note  Patient: [name]  Procedure(s) Performed:  LAPAROSCOPIC TUBAL LIGATION - Attempted see Nursing note; DILATATION & CURETTAGE/HYSTEROSCOPY WITH NOVASURE ABLATION Patient is awake and responsive. Pain and nausea are reasonably well controlled. Vital signs are stable and clinically acceptable. Oxygen saturation is clinically acceptable. There are no apparent anesthetic complications at this time. We spent a long time discussing the perilous nature of her airway and difficult intubation. I encouraged both an alert wrist bracelet for DI and weight loss. Questions answered.  Patient is ready for discharge.

## 2010-12-09 NOTE — Progress Notes (Signed)
D/C instructions done with patient and her son, Winthrop.  Reinforced the importance of using CPAP at home given airway issues and risk for respiratory complications post-anesthesia and with narcotic pain medications to be taken at home.  Patient and son verbalized understanding and agreed to plan of care.  Pt stated she will use CPAP throughout the day while sleeping and tonight at bedtime.  Incentive spirometer given to patient to perform at home.  Instructed to do IS every hour while awake.  Stated understanding.

## 2010-12-09 NOTE — OR Nursing (Signed)
Procedure:Attepted Laparoscopic Tubal Ligation

## 2010-12-09 NOTE — Progress Notes (Signed)
Pt states that her son, Marlinda Mike, will be driving her home today and will be staying with her.  Informed pt of the importance of wearing her CPAP throughout the entire day while sleeping because she has had anesthesia.  Instructed on use of incentive spirometer and given to pt to do at home.  Patient stated understanding and voices intent to perform as instructed to avoid respiratory complications at home.  Will give son same instructions during d/c education.

## 2010-12-09 NOTE — Progress Notes (Signed)
  Patient name [name] DICTATION#  [id number] CSN# [id number]   Juluis Mire, MD 12/09/2010 8:47 AM

## 2010-12-10 ENCOUNTER — Encounter (HOSPITAL_COMMUNITY): Payer: Self-pay | Admitting: Obstetrics and Gynecology

## 2010-12-23 ENCOUNTER — Ambulatory Visit (INDEPENDENT_AMBULATORY_CARE_PROVIDER_SITE_OTHER): Payer: Managed Care, Other (non HMO) | Admitting: Critical Care Medicine

## 2010-12-23 ENCOUNTER — Encounter: Payer: Self-pay | Admitting: Critical Care Medicine

## 2010-12-23 VITALS — BP 140/100 | HR 82 | Temp 98.0°F | Ht 65.0 in | Wt 326.0 lb

## 2010-12-23 DIAGNOSIS — E669 Obesity, unspecified: Secondary | ICD-10-CM

## 2010-12-23 DIAGNOSIS — J90 Pleural effusion, not elsewhere classified: Secondary | ICD-10-CM

## 2010-12-23 MED ORDER — PREDNISONE 1 MG PO TABS: ORAL_TABLET | ORAL | Status: DC

## 2010-12-23 NOTE — Patient Instructions (Signed)
USe 1mg  prednisone to reduce by 1mg  every 7days from 10mg  /d to 5mg /d and hold.  1mg  pred sent to pharmacy, you also can break 10mg  in 1/2 until that supply is gone No other med changes Return 4 months

## 2010-12-23 NOTE — Progress Notes (Signed)
Subjective:    Patient ID: [name], female    DOB: [birth year], 45 y.o.   MRN: [id number]  HPI  45 y.o. wf last smoked around 06/2009 with chronic L empyema s/p VATS 2010.  February 07, 2010 10:21 AM  dyspnea is better. able to walk farther . had sl congestion. coughed up some mucus.  nasal stuffiness is noted and nose is dry  no real chest pain  no fever now, dental issues are better   8/22 Since last ov not much dyspnea or cough.  No f/c/s.  Wants bariatric surgery (sleeve procedure, gastrectomy).  IVC filter placed proph for PE/DVT Still on coumadin and is to bridge to lovenox No chest pain.  Will feel muscle tightness.  No recent chest xray done Dr Adolphus Birchwood with [facility]  12/23/2010 Had sleeve gastrectomy 10/02/10.  No real complications.  Has seen a difference with outcome. Since this , no real dyspnea.  No real cough.  Had flu vaccine Pt denies any significant sore throat, nasal congestion or excess secretions, fever, chills, sweats, unintended weight loss, pleurtic or exertional chest pain, orthopnea PND, or leg swelling Pt denies any increase in rescue therapy over baseline, denies waking up needing it or having any early am or nocturnal exacerbations of coughing/wheezing/or dyspnea. Pt also denies any obvious fluctuation in symptoms with  weather or environmental change or other alleviating or aggravating factors     Past Medical History  Diagnosis Date  . Nonspecific abnormal results of liver function study   . Personal history of venous thrombosis and embolism 2002    during pregnancy; ?factor 5 leiden (sees heme)  . Edema   . 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 Nephritis and nephropathy, not specified as acute or chronic, with unspecified pathological lesion in kidney   . Pure hypercholesterolemia   . Unspecified essential hypertension   . Other specified acquired hypothyroidism   . Obesity,  unspecified   . Panic disorder without agoraphobia   . Unspecified pleural effusion   . Other pulmonary embolism and infarction   . 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 Pleural effusion 2005    c/w lupus initial w/u; recurrent Right as pred tapered off July 2011  . HLD (hyperlipidemia)   . Iron deficiency anemia     IV dextran [place]  . 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 GERD (gastroesophageal reflux disease)     on prilosec r/t gastric sleeve surgery  . Arthritis     Lupus - hands/knees  . Anxiety state, unspecified     panic attacks  . Sleep apnea      Family History  Problem Relation Age of Onset  . Lupus Sister   . Diabetes      GM     History   Social History  . Marital Status: Divorced    Spouse Name: N/A    Number of Children: 3  . Years of Education: N/A   Occupational History  . Trainer at [employer]    Social History Main Topics  . Smoking status: Former Smoker -- 0.5 packs/day for 10 years    Types: Cigarettes    Quit date: 05/04/2009  . Smokeless tobacco: Never Used   Comment: Socially x10 years (1 pack/month)  . Alcohol Use: Yes     socially  .  Drug Use: No  . Sexually Active: Yes    Birth Control/ Protection: Condom   Other Topics Concern  . Not on file   Social History Narrative  . No narrative on file     Allergies  Allergen Reactions  . Amoxicillin     REACTION: Pt. reports allergy, reaction not known  . Atorvastatin     REACTION: Reaction not known  . Moxifloxacin     REACTION: hallucinations     Outpatient Prescriptions Prior to Visit  Medication Sig Dispense Refill  . Calcium Carb-Cholecalciferol (CALCIUM 1000 + D PO) Take 1 tablet by mouth daily.        . cholecalciferol (VITAMIN D) 1000 UNITS tablet Take 1,000 Units by mouth daily.        . clobetasol (TEMOVATE) 0.05 % ointment Apply 1 application topically as needed.        . hydroxychloroquine (PLAQUENIL)  200 MG tablet Take 400 mg by mouth daily.       Marland Kitchen levothyroxine (SYNTHROID, LEVOTHROID) 200 MCG tablet Take 200 mcg by mouth daily.        . Multiple Vitamin (MULTIVITAMIN) tablet Take 1 tablet by mouth daily.        . mupirocin (BACTROBAN) 2 % ointment Apply to affected area 2 times daily       . omeprazole (PRILOSEC) 20 MG capsule Take 20 mg by mouth 2 (two) times daily.        Marland Kitchen PARoxetine (PAXIL) 40 MG tablet Take 40 mg by mouth every morning.        . ursodiol (ACTIGALL) 250 MG tablet Take by mouth 2 (two) times daily.        Marland Kitchen warfarin (COUMADIN) 7.5 MG tablet Take 7.5-11.25 mg by mouth as directed. Pt takes 11.25 mg (1.5 tablets) daily except thursdays, she takes 7.5mg  (1 tablet)      . predniSONE (DELTASONE) 10 MG tablet Take 1 tablet (10 mg total) by mouth daily. Or as directed  100 tablet  1  . predniSONE (DELTASONE) 10 MG tablet pack Take 10 mg by mouth daily.           Review of Systems  Constitutional:   No  weight loss, night sweats,  Fevers, chills, fatigue, lassitude. HEENT:   No headaches,  Difficulty swallowing,  Tooth/dental problems,  Sore throat,                No sneezing, itching, ear ache, nasal congestion, post nasal drip,   CV:  No chest pain,  Orthopnea, PND, swelling in lower extremities, anasarca, dizziness, palpitations  GI  No heartburn, indigestion, abdominal pain, nausea, vomiting, diarrhea, change in bowel habits, loss of appetite  Resp: No shortness of breath with exertion or at rest.  No excess mucus, no productive cough,  No non-productive cough,  No coughing up of blood.  No change in color of mucus.  No wheezing.  No chest wall deformity  Skin: no rash or lesions.  GU: no dysuria, change in color of urine, no urgency or frequency.  No flank pain.  MS:  No joint pain or swelling.  No decreased range of motion.  No back pain.  Psych:  No change in mood or affect. No depression or anxiety.  No memory loss.     Objective:   Physical  Exam   Filed Vitals:   12/23/10 0915  BP: 140/100  Pulse: 82  Temp: 98 F (36.7 C)  TempSrc: Oral  Height: 5\' 5"  (  1.651 m)  Weight: 326 lb (147.873 kg)  SpO2: 98%    Gen: Pleasant, well-nourished, in no distress,  normal affect  ENT: No lesions,  mouth clear,  oropharynx clear, no postnasal drip  Neck: No JVD, no TMG, no carotid bruits  Lungs: No use of accessory muscles, no dullness to percussion, decreased BS 1/4 way up on L  Cardiovascular: RRR, heart sounds normal, no murmur or gallops, no peripheral edema  Abdomen: soft and NT, no HSM,  BS normal  Musculoskeletal: No deformities, no cyanosis or clubbing  Neuro: alert, non focal  Skin: Warm, no lesions or rashes  8/22 CXR IMPRESSION:  Grossly unchanged chest radiograph including moderate to large  sized left-sided pleural effusion.      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 Return in         4 months      Updated Medication List Outpatient Encounter Prescriptions as of 12/23/2010  Medication Sig Dispense Refill  . Calcium Carb-Cholecalciferol (CALCIUM 1000 + D PO) Take 1 tablet by mouth daily.        . cholecalciferol (VITAMIN D) 1000 UNITS tablet Take 1,000 Units by mouth daily.        . clobetasol (TEMOVATE) 0.05 % ointment Apply 1 application topically as needed.        . ferrous fumarate (HEMOCYTE - 106 MG FE) 325 (106 FE) MG TABS Take 1 tablet by mouth daily.        . hydroxychloroquine (PLAQUENIL) 200 MG tablet Take 400 mg by mouth daily.       Marland Kitchen levothyroxine (SYNTHROID, LEVOTHROID) 200 MCG tablet Take 200 mcg by mouth daily.        . Multiple Vitamin (MULTIVITAMIN) tablet Take 1 tablet by mouth daily.        . mupirocin (BACTROBAN) 2 % ointment Apply to affected area 2 times daily       .  omeprazole (PRILOSEC) 20 MG capsule Take 20 mg by mouth 2 (two) times daily.        Marland Kitchen PARoxetine (PAXIL) 40 MG tablet Take 40 mg by mouth every morning.        . ursodiol (ACTIGALL) 250 MG tablet Take by mouth 2 (two) times daily.        Marland Kitchen warfarin (COUMADIN) 7.5 MG tablet Take 7.5-11.25 mg by mouth as directed. Pt takes 11.25 mg (1.5 tablets) daily except thursdays, she takes 7.5mg  (1 tablet)      . DISCONTD: predniSONE (DELTASONE) 10 MG tablet Take 1 tablet (10 mg total) by mouth daily. Or as directed  100 tablet  1  . predniSONE (DELTASONE) 1 MG tablet Take as directed.  Reduce by 1mg  every 7days until at 5mg  daily and hold  100 tablet  6  . DISCONTD: predniSONE (DELTASONE) 10 MG tablet pack Take 10 mg by mouth daily.

## 2010-12-23 NOTE — Assessment & Plan Note (Addendum)
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 Return in         4 months

## 2010-12-24 ENCOUNTER — Other Ambulatory Visit: Payer: Self-pay

## 2010-12-24 MED ORDER — LEVOTHYROXINE SODIUM 200 MCG PO TABS: 200.0000 ug | ORAL_TABLET | Freq: Every day | ORAL | Status: DC

## 2010-12-24 NOTE — Telephone Encounter (Signed)
[facility] [address] faxed refill request Synthroid 200 mcg # 30 x 3 with note pt needs to call for appt.

## 2011-01-17 ENCOUNTER — Ambulatory Visit (INDEPENDENT_AMBULATORY_CARE_PROVIDER_SITE_OTHER): Payer: Managed Care, Other (non HMO) | Admitting: Family Medicine

## 2011-01-17 DIAGNOSIS — Z5181 Encounter for therapeutic drug level monitoring: Secondary | ICD-10-CM

## 2011-01-17 DIAGNOSIS — Z7901 Long term (current) use of anticoagulants: Secondary | ICD-10-CM

## 2011-01-17 DIAGNOSIS — I2699 Other pulmonary embolism without acute cor pulmonale: Secondary | ICD-10-CM

## 2011-01-17 LAB — POCT INR: INR: 3

## 2011-01-17 NOTE — Patient Instructions (Signed)
Continue current dose, check in 4 weeks  

## 2011-02-14 ENCOUNTER — Ambulatory Visit (INDEPENDENT_AMBULATORY_CARE_PROVIDER_SITE_OTHER): Payer: Managed Care, Other (non HMO) | Admitting: Family Medicine

## 2011-02-14 DIAGNOSIS — I2699 Other pulmonary embolism without acute cor pulmonale: Secondary | ICD-10-CM

## 2011-02-14 DIAGNOSIS — Z7901 Long term (current) use of anticoagulants: Secondary | ICD-10-CM

## 2011-02-14 DIAGNOSIS — Z5181 Encounter for therapeutic drug level monitoring: Secondary | ICD-10-CM

## 2011-02-14 NOTE — Patient Instructions (Signed)
Continue current dose, check in 4 weeks  

## 2011-03-13 ENCOUNTER — Ambulatory Visit: Payer: Managed Care, Other (non HMO)

## 2011-03-17 ENCOUNTER — Ambulatory Visit (INDEPENDENT_AMBULATORY_CARE_PROVIDER_SITE_OTHER): Payer: Managed Care, Other (non HMO) | Admitting: Family Medicine

## 2011-03-17 DIAGNOSIS — Z5181 Encounter for therapeutic drug level monitoring: Secondary | ICD-10-CM

## 2011-03-17 DIAGNOSIS — Z7901 Long term (current) use of anticoagulants: Secondary | ICD-10-CM

## 2011-03-17 DIAGNOSIS — I2699 Other pulmonary embolism without acute cor pulmonale: Secondary | ICD-10-CM

## 2011-03-17 LAB — POCT INR: INR: 3.9

## 2011-03-17 NOTE — Patient Instructions (Addendum)
Hold today's dose per Dr Sharen Hones, 11.25 mg daily, 7.5 mg Tue, thur  recheck in 1 weeks. Reduced dose by 3.75 mg weekly LMOM for the patient. Changed her dose and appt date

## 2011-03-24 ENCOUNTER — Ambulatory Visit: Payer: Managed Care, Other (non HMO)

## 2011-03-31 ENCOUNTER — Ambulatory Visit: Payer: Managed Care, Other (non HMO)

## 2011-03-31 ENCOUNTER — Ambulatory Visit (INDEPENDENT_AMBULATORY_CARE_PROVIDER_SITE_OTHER): Payer: Managed Care, Other (non HMO) | Admitting: Family Medicine

## 2011-03-31 DIAGNOSIS — Z5181 Encounter for therapeutic drug level monitoring: Secondary | ICD-10-CM

## 2011-03-31 DIAGNOSIS — Z7901 Long term (current) use of anticoagulants: Secondary | ICD-10-CM

## 2011-03-31 DIAGNOSIS — I2699 Other pulmonary embolism without acute cor pulmonale: Secondary | ICD-10-CM

## 2011-03-31 NOTE — Patient Instructions (Signed)
11.25 mg daily, 7.5 mg Tue, thur  Check in 2 weeks

## 2011-04-11 ENCOUNTER — Ambulatory Visit: Payer: Managed Care, Other (non HMO)

## 2011-04-15 IMAGING — CR DG CHEST 2V
2 series · 2 of 2 positions shown · non-contrast
Comparison: 10/17/2009

CLINICAL DATA: Right pleural effusion.

CHEST - 2 VIEW

[w chest pa]
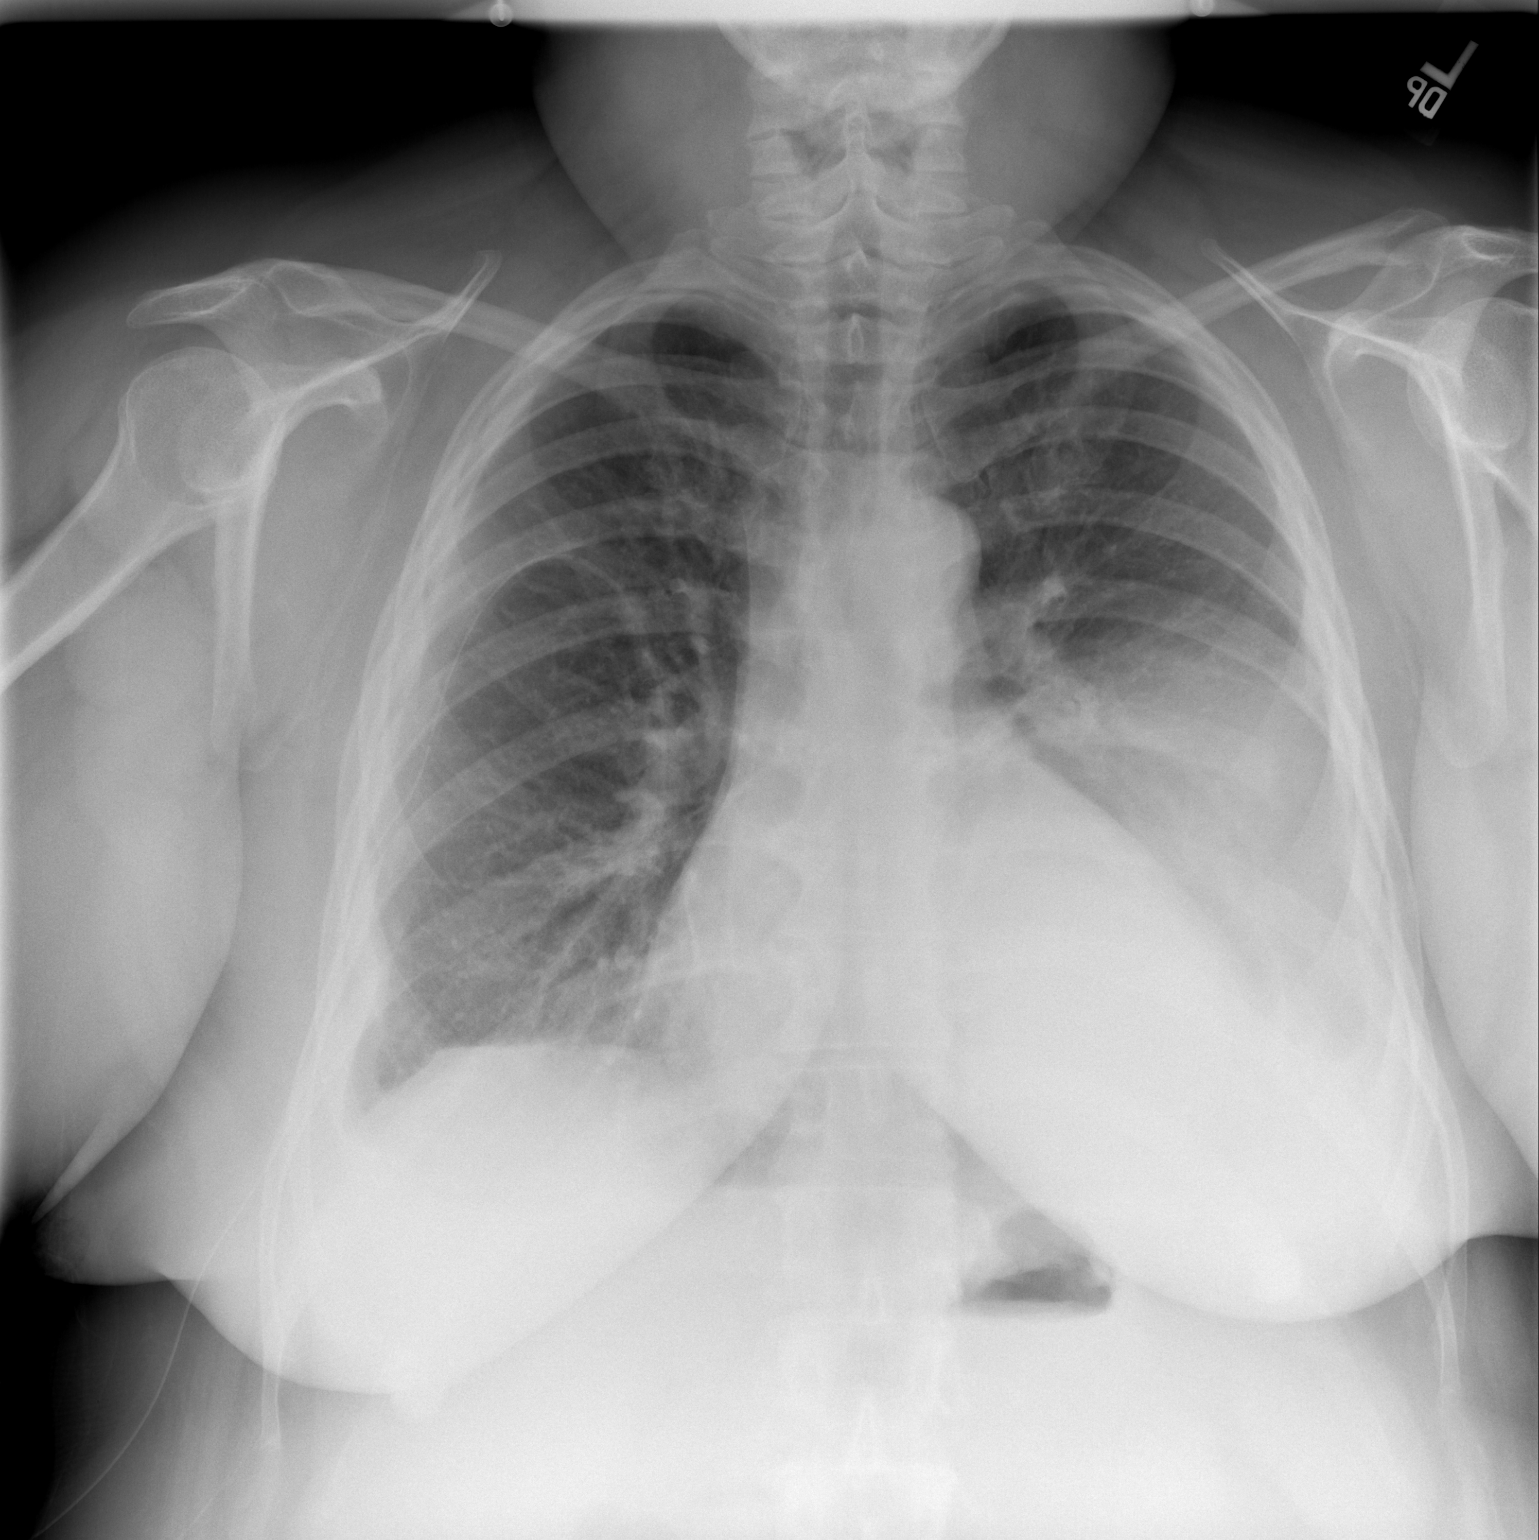

[w chest lat]
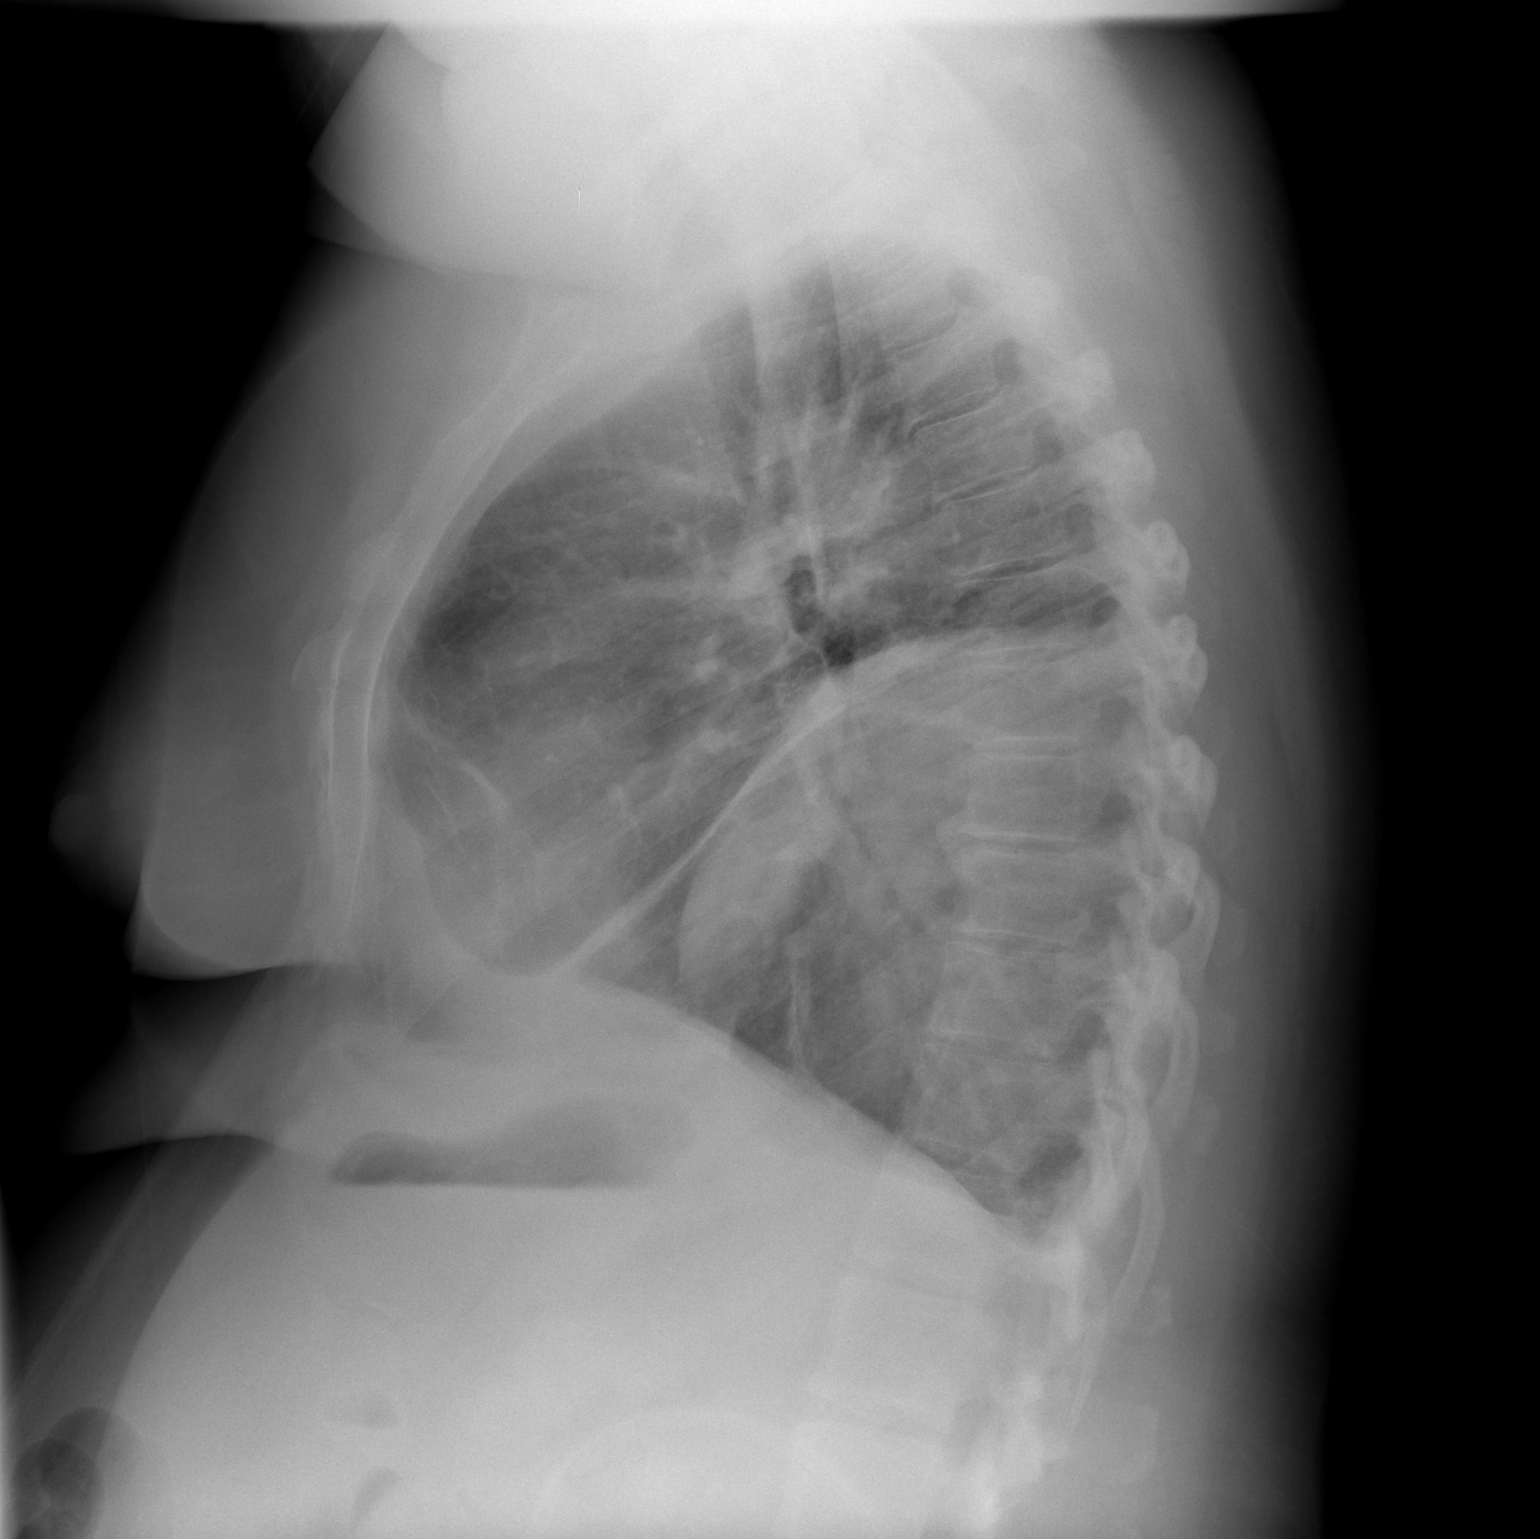

[2 of 2 positions shown; findings below may reference images not displayed]

FINDINGS: Trachea is midline.  Heart size stable.  Right chest tube
remains in place.  Small right pleural effusion or pleural
thickening.  No pneumothorax.  A large rounded opacity occupies the
lower half of the left hemithorax, as before.
IMPRESSION: 1.  Small right pleural effusion and/or pleural thickening with
right chest tube in place.
2.  Large loculated pleural fluid collection in the left
hemithorax, stable.

## 2011-04-24 ENCOUNTER — Encounter: Payer: Self-pay | Admitting: Critical Care Medicine

## 2011-04-24 ENCOUNTER — Ambulatory Visit (INDEPENDENT_AMBULATORY_CARE_PROVIDER_SITE_OTHER): Payer: Managed Care, Other (non HMO) | Admitting: Critical Care Medicine

## 2011-04-24 VITALS — BP 160/110 | HR 84 | Temp 98.1°F | Ht 65.0 in | Wt 304.0 lb

## 2011-04-24 DIAGNOSIS — J90 Pleural effusion, not elsewhere classified: Secondary | ICD-10-CM

## 2011-04-24 DIAGNOSIS — I2699 Other pulmonary embolism without acute cor pulmonale: Secondary | ICD-10-CM

## 2011-04-24 NOTE — Assessment & Plan Note (Signed)
Stable pleural effusion/pleuritis d/t SLE Plan Continued observation

## 2011-04-24 NOTE — Assessment & Plan Note (Signed)
Pt to be on coumadin for life

## 2011-04-24 NOTE — Progress Notes (Signed)
Subjective:    Patient ID: [name], female    DOB: [birth year], 46 y.o.   MRN: [id number]  HPI  46 y.o. wf last smoked around 06/2009 with chronic L empyema s/p VATS 2010.    04/24/2011 Now down to pred 5mg /d   .  Had to go slowly.  No new pulm issues.  No real cough Pt denies any significant sore throat, nasal congestion or excess secretions, fever, chills, sweats, unintended weight loss, pleurtic or exertional chest pain, orthopnea PND, or leg swelling Pt denies any increase in rescue therapy over baseline, denies waking up needing it or having any early am or nocturnal exacerbations of coughing/wheezing/or dyspnea. Pt also denies any obvious fluctuation in symptoms with  weather or environmental change or other alleviating or aggravating factors       Past Medical History  Diagnosis Date  . Nonspecific abnormal results of liver function study   . Personal history of venous thrombosis and embolism 2002    during pregnancy; ?factor 5 leiden (sees heme)  . Edema   . 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 Nephritis and nephropathy, not specified as acute or chronic, with unspecified pathological lesion in kidney   . Pure hypercholesterolemia   . Unspecified essential hypertension   . Other specified acquired hypothyroidism   . Obesity, unspecified   . Panic disorder without agoraphobia   . Unspecified pleural effusion   . Other pulmonary embolism and infarction   . 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 Pleural effusion 2005    c/w lupus initial w/u; recurrent Right as pred tapered off July 2011  . HLD (hyperlipidemia)   . Iron deficiency anemia     IV dextran Coladonato  . 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 GERD (gastroesophageal reflux disease)     on prilosec r/t gastric sleeve surgery  . Arthritis     Lupus - hands/knees  . Anxiety state, unspecified     panic attacks  . Sleep apnea      Family History  Problem Relation Age of Onset  . Lupus Sister   . Diabetes      GM     History   Social History  . Marital Status: Divorced    Spouse Name: N/A    Number of Children: 3  . Years of Education: N/A   Occupational History  . Trainer at [employer]    Social History Main Topics  . Smoking status: Former Smoker -- 0.5 packs/day for 10 years    Types: Cigarettes    Quit date: 05/04/2009  . Smokeless tobacco: Never Used   Comment: Socially x10 years (1 pack/month)  . Alcohol Use: Yes     socially  . Drug Use: No  . Sexually Active: Yes    Birth Control/ Protection: Condom   Other Topics Concern  . Not on file   Social History Narrative  . No narrative on file     Allergies  Allergen Reactions  . Amoxicillin     REACTION: Pt. reports allergy, reaction not known  . Atorvastatin     REACTION: Reaction not known  . Moxifloxacin     REACTION: hallucinations     Outpatient Prescriptions Prior to Visit  Medication Sig Dispense Refill  . Calcium Carb-Cholecalciferol (CALCIUM 1000 + D PO) Take 1 tablet  by mouth daily.        . cholecalciferol (VITAMIN D) 1000 UNITS tablet Take 1,000 Units by mouth daily.        . clobetasol (TEMOVATE) 0.05 % ointment Apply 1 application topically as needed.        . ferrous fumarate (HEMOCYTE - 106 MG FE) 325 (106 FE) MG TABS Take 1 tablet by mouth daily.        Marland Kitchen levothyroxine (SYNTHROID, LEVOTHROID) 200 MCG tablet Take 1 tablet (200 mcg total) by mouth daily.  30 tablet  3  . Multiple Vitamin (MULTIVITAMIN) tablet Take 1 tablet by mouth daily.        Marland Kitchen omeprazole (PRILOSEC) 20 MG capsule Take 20 mg by mouth 2 (two) times daily.        Marland Kitchen PARoxetine (PAXIL) 40 MG tablet Take 40 mg by mouth every morning.        . ursodiol (ACTIGALL) 250 MG tablet Take by mouth 2 (two) times daily.        Marland Kitchen warfarin (COUMADIN) 7.5 MG tablet Take 7.5-11.25 mg by mouth as directed. Pt takes 11.25 mg (1.5  tablets) daily except thursdays, she takes 7.5mg  (1 tablet)      . predniSONE (DELTASONE) 1 MG tablet Take as directed.  Reduce by 1mg  every 7days until at 5mg  daily and hold  100 tablet  6  . hydroxychloroquine (PLAQUENIL) 200 MG tablet Take 400 mg by mouth daily.       . mupirocin (BACTROBAN) 2 % ointment Apply to affected area 2 times daily          Review of Systems  Constitutional:   No  weight loss, night sweats,  Fevers, chills, fatigue, lassitude. HEENT:   No headaches,  Difficulty swallowing,  Tooth/dental problems,  Sore throat,                No sneezing, itching, ear ache, nasal congestion, post nasal drip,   CV:  No chest pain,  Orthopnea, PND, swelling in lower extremities, anasarca, dizziness, palpitations  GI  No heartburn, indigestion, abdominal pain, nausea, vomiting, diarrhea, change in bowel habits, loss of appetite  Resp: No shortness of breath with exertion or at rest.  No excess mucus, no productive cough,  No non-productive cough,  No coughing up of blood.  No change in color of mucus.  No wheezing.  No chest wall deformity  Skin: no rash or lesions.  GU: no dysuria, change in color of urine, no urgency or frequency.  No flank pain.  MS:  No joint pain or swelling.  No decreased range of motion.  No back pain.  Psych:  No change in mood or affect. No depression or anxiety.  No memory loss.     Objective:   Physical Exam   Filed Vitals:   04/24/11 1158  BP: 160/110  Pulse: 84  Temp: 98.1 F (36.7 C)  TempSrc: Oral  Height: 5\' 5"  (1.651 m)  Weight: 304 lb (137.893 kg)  SpO2: 96%    Gen: Pleasant, well-nourished, in no distress,  normal affect  ENT: No lesions,  mouth clear,  oropharynx clear, no postnasal drip  Neck: No JVD, no TMG, no carotid bruits  Lungs: No use of accessory muscles, no dullness to percussion, decreased BS 1/4 way up on L  Cardiovascular: RRR, heart sounds normal, no murmur or gallops, no peripheral edema  Abdomen: soft  and NT, no HSM,  BS normal  Musculoskeletal: No deformities, no cyanosis or  clubbing  Neuro: alert, non focal  Skin: Warm, no lesions or rashes      Assessment & Plan:   PLEURAL EFFUSION Stable pleural effusion/pleuritis d/t SLE Plan Continued observation  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 Calcium Carb-Cholecalciferol (CALCIUM 1000 + D PO) Take 1 tablet by mouth daily.        . cholecalciferol (VITAMIN D) 1000 UNITS tablet Take 1,000 Units by mouth daily.        . clobetasol (TEMOVATE) 0.05 % ointment Apply 1 application topically as needed.        . ferrous fumarate (HEMOCYTE - 106 MG FE) 325 (106 FE) MG TABS Take 1 tablet by mouth daily.        Marland Kitchen levothyroxine (SYNTHROID, LEVOTHROID) 200 MCG tablet Take 1 tablet (200 mcg total) by mouth daily.  30 tablet  3  . Multiple Vitamin (MULTIVITAMIN) tablet Take 1 tablet by mouth daily.        Marland Kitchen omeprazole (PRILOSEC) 20 MG capsule Take 20 mg by mouth 2 (two) times daily.        Marland Kitchen PARoxetine (PAXIL) 40 MG tablet Take 40 mg by mouth every morning.        . predniSONE (DELTASONE) 1 MG tablet Take 5 mg by mouth daily. Take as directed.  Reduce by 1mg  every 7days until at 5mg  daily and hold      . ursodiol (ACTIGALL) 250 MG tablet Take by mouth 2 (two) times daily.        Marland Kitchen warfarin (COUMADIN) 7.5 MG tablet Take 7.5-11.25 mg by mouth as directed. Pt takes 11.25 mg (1.5 tablets) daily except thursdays, she takes 7.5mg  (1 tablet)      . DISCONTD: predniSONE (DELTASONE) 1 MG tablet Take as directed.  Reduce by 1mg  every 7days until at 5mg  daily and hold  100 tablet  6  . DISCONTD: hydroxychloroquine (PLAQUENIL) 200 MG tablet Take 400 mg by mouth daily.       Marland Kitchen DISCONTD: mupirocin (BACTROBAN) 2 % ointment Apply to affected area 2 times daily

## 2011-04-24 NOTE — Patient Instructions (Signed)
No change in medications. Return in        6 months        

## 2011-04-25 ENCOUNTER — Ambulatory Visit (INDEPENDENT_AMBULATORY_CARE_PROVIDER_SITE_OTHER): Payer: Managed Care, Other (non HMO) | Admitting: Family Medicine

## 2011-04-25 DIAGNOSIS — Z7901 Long term (current) use of anticoagulants: Secondary | ICD-10-CM

## 2011-04-25 DIAGNOSIS — Z5181 Encounter for therapeutic drug level monitoring: Secondary | ICD-10-CM

## 2011-04-25 DIAGNOSIS — I2699 Other pulmonary embolism without acute cor pulmonale: Secondary | ICD-10-CM

## 2011-04-25 LAB — POCT INR: INR: 3.6

## 2011-04-25 NOTE — Patient Instructions (Signed)
11.25 mg daily, 7.5 mg Tue, thur  Check in 1  weeks

## 2011-04-30 ENCOUNTER — Other Ambulatory Visit: Payer: Self-pay | Admitting: Family Medicine

## 2011-05-01 ENCOUNTER — Ambulatory Visit: Payer: Managed Care, Other (non HMO)

## 2011-05-09 ENCOUNTER — Telehealth: Payer: Self-pay | Admitting: *Deleted

## 2011-05-09 ENCOUNTER — Other Ambulatory Visit: Payer: Managed Care, Other (non HMO)

## 2011-05-09 DIAGNOSIS — Z7901 Long term (current) use of anticoagulants: Secondary | ICD-10-CM

## 2011-05-09 DIAGNOSIS — Z5181 Encounter for therapeutic drug level monitoring: Secondary | ICD-10-CM

## 2011-05-09 DIAGNOSIS — I2699 Other pulmonary embolism without acute cor pulmonale: Secondary | ICD-10-CM

## 2011-05-09 NOTE — Telephone Encounter (Signed)
Patient came in for protime check today. I am not able to document on the coumadin chart. Her protime was 3.7. Dr. Para March advised to continue 11.25 mg daily and on every 5th day take 7.5 mg. Patient verbalized understanding. She will recheck in 2 weeks. Scheduled recheck. No missed doses or abnormal bleeding.

## 2011-05-09 NOTE — Telephone Encounter (Signed)
Thank you :)

## 2011-05-20 ENCOUNTER — Other Ambulatory Visit: Payer: Self-pay | Admitting: *Deleted

## 2011-05-20 MED ORDER — OMEPRAZOLE 20 MG PO CPDR: 20.0000 mg | DELAYED_RELEASE_CAPSULE | Freq: Two times a day (BID) | ORAL | Status: DC

## 2011-05-20 MED ORDER — WARFARIN SODIUM 7.5 MG PO TABS: ORAL_TABLET | ORAL | Status: DC

## 2011-05-20 NOTE — Telephone Encounter (Signed)
Patient not seen in over 1 year for cpx ok to refill? 

## 2011-05-20 NOTE — Telephone Encounter (Signed)
Needs f/u when able  I am not familiar with the ursodiol-- it must be px by someone else.. Will elect refil the others

## 2011-05-21 ENCOUNTER — Other Ambulatory Visit: Payer: Self-pay | Admitting: *Deleted

## 2011-05-21 NOTE — Telephone Encounter (Signed)
Received faxed refill request from pharmacy for Synthroid and Paxil. Directions on Paxil show take one and one-half every day, this does not match medication sheet.  Paperwork is in your in box. Is it okay to refill medication?

## 2011-05-21 NOTE — Telephone Encounter (Signed)
Left message on cell phone voicemail advising patient to call and schedule f/u appt and that Rxs were sent to her pharmacy.

## 2011-05-21 NOTE — Telephone Encounter (Signed)
Please check with her about that before I fill it, thanks I will hold the forms on my desk

## 2011-05-22 NOTE — Telephone Encounter (Signed)
Left message on cell phone voicemail for patient to return call. 

## 2011-05-23 ENCOUNTER — Ambulatory Visit (INDEPENDENT_AMBULATORY_CARE_PROVIDER_SITE_OTHER): Payer: Managed Care, Other (non HMO) | Admitting: Family Medicine

## 2011-05-23 ENCOUNTER — Other Ambulatory Visit: Payer: Self-pay | Admitting: *Deleted

## 2011-05-23 DIAGNOSIS — Z7901 Long term (current) use of anticoagulants: Secondary | ICD-10-CM

## 2011-05-23 DIAGNOSIS — I4891 Unspecified atrial fibrillation: Secondary | ICD-10-CM

## 2011-05-23 DIAGNOSIS — Z5181 Encounter for therapeutic drug level monitoring: Secondary | ICD-10-CM

## 2011-05-23 LAB — POCT INR: INR: 1.2

## 2011-05-23 NOTE — Patient Instructions (Signed)
11.25 mg daily,  Check in 1  week

## 2011-05-23 NOTE — Telephone Encounter (Signed)
Left message on cell phone voicemail for patient to return call. 

## 2011-05-26 ENCOUNTER — Other Ambulatory Visit: Payer: Self-pay | Admitting: *Deleted

## 2011-05-26 MED ORDER — WARFARIN SODIUM 7.5 MG PO TABS: ORAL_TABLET | ORAL | Status: DC

## 2011-05-26 NOTE — Telephone Encounter (Signed)
Received faxed refill request from [facility].  Form is in your in box.

## 2011-05-26 NOTE — Telephone Encounter (Signed)
Left message on cell phone voicemail for patient to return call. 

## 2011-05-26 NOTE — Telephone Encounter (Signed)
Done in IN box for this and thyroid med - but still holding the one for paxil because she will not call us back about it

## 2011-05-27 MED ORDER — LEVOTHYROXINE SODIUM 200 MCG PO TABS: 200.0000 ug | ORAL_TABLET | Freq: Every day | ORAL | Status: DC

## 2011-05-27 NOTE — Telephone Encounter (Signed)
Rx for Synthroid faxed to [facility].  Paxil will not be filled until we hear back from patient.

## 2011-05-27 NOTE — Telephone Encounter (Signed)
Completed form faxed to [facility] at [phone].

## 2011-05-28 ENCOUNTER — Encounter (HOSPITAL_COMMUNITY): Payer: Managed Care, Other (non HMO)

## 2011-05-30 ENCOUNTER — Ambulatory Visit: Payer: Managed Care, Other (non HMO)

## 2011-06-03 ENCOUNTER — Ambulatory Visit (INDEPENDENT_AMBULATORY_CARE_PROVIDER_SITE_OTHER): Payer: Managed Care, Other (non HMO) | Admitting: Family Medicine

## 2011-06-03 ENCOUNTER — Encounter: Payer: Self-pay | Admitting: Family Medicine

## 2011-06-03 VITALS — BP 122/84 | HR 73 | Temp 97.8°F | Ht 65.0 in | Wt 298.5 lb

## 2011-06-03 DIAGNOSIS — R739 Hyperglycemia, unspecified: Secondary | ICD-10-CM

## 2011-06-03 DIAGNOSIS — Z5181 Encounter for therapeutic drug level monitoring: Secondary | ICD-10-CM

## 2011-06-03 DIAGNOSIS — R7309 Other abnormal glucose: Secondary | ICD-10-CM

## 2011-06-03 DIAGNOSIS — E038 Other specified hypothyroidism: Secondary | ICD-10-CM

## 2011-06-03 DIAGNOSIS — Z7901 Long term (current) use of anticoagulants: Secondary | ICD-10-CM

## 2011-06-03 DIAGNOSIS — I10 Essential (primary) hypertension: Secondary | ICD-10-CM

## 2011-06-03 DIAGNOSIS — E669 Obesity, unspecified: Secondary | ICD-10-CM

## 2011-06-03 DIAGNOSIS — E7800 Pure hypercholesterolemia, unspecified: Secondary | ICD-10-CM

## 2011-06-03 LAB — LIPID PANEL
HDL: 48.7 mg/dL (ref >39.00)
Total CHOL/HDL Ratio: 5
Triglycerides: 113 mg/dL (ref 0.0–149.0)

## 2011-06-03 LAB — AST: AST: 27 U/L (ref 0–37)

## 2011-06-03 LAB — ALT: ALT: 23 U/L (ref 0–35)

## 2011-06-03 NOTE — Progress Notes (Signed)
Subjective:    Patient ID: [name], female    DOB: [birth year], 46 y.o.   MRN: [id number]  HPI Here for f/u of chronic conditions   Since wt los surgery lost  bmi is 21 Getting close to 100 lb loss - has been 93 lb so far  Doing well and feeling quite good   Process has been ok - knows what foods she cannot have  No fried foods - nauseated  Eats slowly - very small portions  Is watching closely- vitamins/ and protein   Sees renal soon - will get iron infusion  Kidney function is stable   bp is   122/84  No cp or palpitations or headaches or edema  No side effects to medicines    Lab Results  Component Value Date   CHOL 300* 07/09/2009   HDL 49.60 07/09/2009   LDLCALC  Value: 192        Total Cholesterol/HDL:CHD Risk Coronary Heart Disease Risk Table                     Men   Women  1/2 Average Risk   3.4   3.3  Average Risk       5.0   4.4  2 X Average Risk   9.6   7.1  3 X Average Risk  23.4   11.0        Use the calculated Patient Ratio above and the CHD Risk Table to determine the patient's CHD Risk.        ATP III CLASSIFICATION (LDL):  <100     mg/dL   Optimal  086-578  mg/dL   Near or Above                    Optimal  130-159  mg/dL   Borderline  469-629  mg/dL   High  >528     mg/dL   Very High* 05/17/2438   LDLDIRECT 241.9 07/09/2009   TRIG 110.0 07/09/2009   CHOLHDL 6 07/09/2009    Hyperglycemia  Lab Results  Component Value Date   HGBA1C 6.4 09/16/2010    Still on prednisone- is on low dose - 5 mg - so need to follow sugar  Diet - is overall much better   Hypothyroid Lab Results  Component Value Date   TSH 0.78 09/16/2010   fesls ok - no change in skin/ hair or energy   SLE/ pleural eff  Has done a lot better with that  Joint pain is better controlled - aches and pains controlled by tylenol   Gets headache easily if she does not get enough sleep   Mood is good   On less medication  Is exercising - walking and using weights   Patient Active Problem List    Diagnoses  . HYPOTHYROIDISM, POSTABLATION  . HYPERCHOLESTEROLEMIA  . OBESITY  . ANXIETY  . PANIC DISORDER  . HYPERTENSION, ESSENTIAL NOS  . PULMONARY EMBOLISM  . PLEURAL EFFUSION  . GLOMERULONEPHRITIS  . SYSTEMIC LUPUS ERYTHEMATOSUS  . EDEMA  . ENLARGEMENT OF LYMPH NODES  . DVT, HX OF  . Cellulitis of right leg  . Hyperglycemia  . Post-operative state   Past Medical History  Diagnosis Date  . Nonspecific abnormal results of liver function study   . Personal history of venous thrombosis and embolism 2002    during pregnancy; ?factor 5 leiden (sees heme)  . Edema   . Empyema without mention of  fistula     Loculated-chronic on Left-VanTright; s/p VATS 5/10  . Nephritis and nephropathy, not specified as acute or chronic, with unspecified pathological lesion in kidney   . Pure hypercholesterolemia   . Unspecified essential hypertension   . Other specified acquired hypothyroidism   . Obesity, unspecified   . Panic disorder without agoraphobia   . Unspecified pleural effusion   . Other pulmonary embolism and infarction   . 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 Pleural effusion 2005    c/w lupus initial w/u; recurrent Right as pred tapered off July 2011  . HLD (hyperlipidemia)   . Iron deficiency anemia     IV dextran Coladonato  . 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 GERD (gastroesophageal reflux disease)     on prilosec r/t gastric sleeve surgery  . Arthritis     Lupus - hands/knees  . Anxiety state, unspecified     panic attacks  . Sleep apnea    Past Surgical History  Procedure Date  . Pleuryx catheter placement 9/11     VanTright----removed 9/11  . Thoracentesis 2010    with penumonia  . Pleurocentesis 10/2004  . Gastric sleeve 8/12    bariatric surgery Dr Adolphus Birchwood   . Wisdom tooth extraction   . Svd     x 3  . Laparoscopic tubal ligation 12/09/2010    Procedure: LAPAROSCOPIC TUBAL LIGATION;  Surgeon:  Juluis Mire;  Location: [facility];  Service: Gynecology;  Laterality: Bilateral;  Attempted see Nursing note   History  Substance Use Topics  . Smoking status: Former Smoker -- 0.5 packs/day for 10 years    Types: Cigarettes    Quit date: 05/04/2009  . Smokeless tobacco: Never Used   Comment: Socially x10 years (1 pack/month)  . Alcohol Use: Yes     socially   Family History  Problem Relation Age of Onset  . Lupus Sister   . Diabetes      GM   Allergies  Allergen Reactions  . Amoxicillin     REACTION: Pt. reports allergy, reaction not known  . Atorvastatin     REACTION: Reaction not known  . Moxifloxacin     REACTION: hallucinations   Current Outpatient Prescriptions on File Prior to Visit  Medication Sig Dispense Refill  . Calcium Carb-Cholecalciferol (CALCIUM 1000 + D PO) Take 1 tablet by mouth daily.        . cholecalciferol (VITAMIN D) 1000 UNITS tablet Take 1,000 Units by mouth daily.        . clobetasol (TEMOVATE) 0.05 % ointment Apply 1 application topically as needed.        . ferrous fumarate (HEMOCYTE - 106 MG FE) 325 (106 FE) MG TABS Take 1 tablet by mouth daily.        Marland Kitchen levothyroxine (SYNTHROID) 200 MCG tablet Take 1 tablet (200 mcg total) by mouth daily.  90 tablet  0  . Multiple Vitamin (MULTIVITAMIN) tablet Take 1 tablet by mouth daily.        Marland Kitchen omeprazole (PRILOSEC) 20 MG capsule Take 1 capsule (20 mg total) by mouth 2 (two) times daily.  60 capsule  3  . PARoxetine (PAXIL) 40 MG tablet Take 40 mg by mouth every morning.        . predniSONE (DELTASONE) 1 MG tablet Take 5 mg by mouth daily. Take as directed.  Reduce by 1mg   every 7days until at 5mg  daily and hold      . warfarin (COUMADIN) 7.5 MG tablet Take po as directed  90 tablet  0   No current facility-administered medications on file prior to visit.      Review of Systems Review of Systems  Constitutional: Negative for fever, appetite change, fatigue and unexpected weight change.  Eyes: Negative for  pain and visual disturbance.  Respiratory: Negative for cough and shortness of breath.   Cardiovascular: Negative for cp or palpitations    Gastrointestinal: Negative for nausea, diarrhea and constipation.  Genitourinary: Negative for urgency and frequency.  Skin: Negative for pallor or rash   MSK pos for joint pain that is overall improved  Neurological: Negative for weakness, light-headedness, numbness and headaches.  Hematological: Negative for adenopathy. Does not bruise/bleed easily.  Psychiatric/Behavioral: Negative for dysphoric mood. The patient is not nervous/anxious.         Objective:   Physical Exam  Constitutional: She appears well-developed and well-nourished. No distress.       Obese but with large weight loss noted   HENT:  Head: Normocephalic and atraumatic.  Mouth/Throat: Oropharynx is clear and moist.  Eyes: Conjunctivae and EOM are normal. Pupils are equal, round, and reactive to light. No scleral icterus.  Neck: Normal range of motion. Neck supple. No JVD present. Carotid bruit is not present. No thyromegaly present.  Cardiovascular: Normal rate, regular rhythm, normal heart sounds and intact distal pulses.  Exam reveals no gallop.   Pulmonary/Chest: Effort normal and breath sounds normal. No respiratory distress. She has no wheezes. She exhibits no tenderness.  Abdominal: Soft. Bowel sounds are normal. She exhibits no distension, no abdominal bruit and no mass. There is no tenderness.  Musculoskeletal: She exhibits edema. She exhibits no tenderness.       LE edema is overall much improved Baseline hyperpigmentation   Lymphadenopathy:    She has no cervical adenopathy.  Neurological: She is alert. She has normal reflexes. No cranial nerve deficit. She exhibits normal muscle tone. Coordination normal.  Skin: Skin is warm and dry. No rash noted. No pallor.  Psychiatric: She has a normal mood and affect.          Assessment & Plan:

## 2011-06-03 NOTE — Patient Instructions (Signed)
Labs today for thyroid/ chol and sugar  Will refil thyroid med after labs return  [place] job so far with weight loss! - I'm glad you are feeling so good

## 2011-06-03 NOTE — Patient Instructions (Signed)
Continue current dose, check in 4 weeks  

## 2011-06-04 ENCOUNTER — Other Ambulatory Visit (HOSPITAL_COMMUNITY): Payer: Self-pay

## 2011-06-05 ENCOUNTER — Other Ambulatory Visit: Payer: Self-pay | Admitting: *Deleted

## 2011-06-05 ENCOUNTER — Encounter (HOSPITAL_COMMUNITY)
Admission: RE | Admit: 2011-06-05 | Discharge: 2011-06-05 | Disposition: A | Discharge status: Home / Self Care | Payer: Managed Care, Other (non HMO) | Source: Ambulatory Visit | Attending: Nephrology | Admitting: Nephrology

## 2011-06-05 DIAGNOSIS — M329 Systemic lupus erythematosus, unspecified: Secondary | ICD-10-CM | POA: Insufficient documentation

## 2011-06-05 DIAGNOSIS — N181 Chronic kidney disease, stage 1: Secondary | ICD-10-CM | POA: Insufficient documentation

## 2011-06-05 DIAGNOSIS — D509 Iron deficiency anemia, unspecified: Secondary | ICD-10-CM | POA: Insufficient documentation

## 2011-06-05 DIAGNOSIS — D6859 Other primary thrombophilia: Secondary | ICD-10-CM | POA: Insufficient documentation

## 2011-06-05 MED ORDER — LEVOTHYROXINE SODIUM 175 MCG PO TABS: 175.0000 ug | ORAL_TABLET | Freq: Every day | ORAL | Status: DC

## 2011-06-05 MED ORDER — SODIUM CHLORIDE 0.9 % IV SOLN
Freq: Once | INTRAVENOUS | Status: AC
  Administered 2011-06-05: 250 mL via INTRAVENOUS

## 2011-06-05 MED ORDER — FERUMOXYTOL INJECTION 510 MG/17 ML
510.0000 mg | Freq: Once | INTRAVENOUS | Status: AC
  Administered 2011-06-05: 510 mg via INTRAVENOUS
  Filled 2011-06-05 (×2): qty 17

## 2011-06-05 NOTE — Assessment & Plan Note (Signed)
bp in fair control at this time  No changes needed  Disc lifstyle change with low sodium diet and exercise   

## 2011-06-05 NOTE — Assessment & Plan Note (Signed)
Overdue for check- first check after wt loss and bariatric surgery Disc goals for lipids and reasons to control them Rev labs with pt- from prev draw Rev low sat fat diet in detail

## 2011-06-05 NOTE — Assessment & Plan Note (Signed)
a1c today Exp imp with 93 lb wt loss  Rev low glycemic diet and exercise

## 2011-06-05 NOTE — Assessment & Plan Note (Signed)
Overdue for TSH today No clinical symptoms

## 2011-06-05 NOTE — Assessment & Plan Note (Signed)
93 lb wt loss after bariatric surgery - commended on that! Enc to keep up healthy diet and exercise habits

## 2011-06-09 ENCOUNTER — Telehealth: Payer: Self-pay | Admitting: *Deleted

## 2011-06-09 NOTE — Telephone Encounter (Signed)
Form faxed to [facility] at [phone].

## 2011-06-09 NOTE — Telephone Encounter (Signed)
Received fax from [facility] regarding a possible drug interaction between Levothyroxine and Warfarin.  Form in your IN box.

## 2011-06-09 NOTE — Telephone Encounter (Signed)
Form done and in IN box I told them to fill thyroid med as ordered

## 2011-07-03 ENCOUNTER — Ambulatory Visit (INDEPENDENT_AMBULATORY_CARE_PROVIDER_SITE_OTHER): Payer: Managed Care, Other (non HMO) | Admitting: Internal Medicine

## 2011-07-03 DIAGNOSIS — Z5181 Encounter for therapeutic drug level monitoring: Secondary | ICD-10-CM

## 2011-07-03 DIAGNOSIS — Z86718 Personal history of other venous thrombosis and embolism: Secondary | ICD-10-CM

## 2011-07-03 DIAGNOSIS — I2699 Other pulmonary embolism without acute cor pulmonale: Secondary | ICD-10-CM

## 2011-07-03 DIAGNOSIS — Z7902 Long term (current) use of antithrombotics/antiplatelets: Secondary | ICD-10-CM

## 2011-07-03 NOTE — Patient Instructions (Signed)
Hold x 1 day then 11.25 mg daily, except FRI/MON take 7.5 mg ( reduced 7.5 mg weekly) 5 days

## 2011-07-08 ENCOUNTER — Ambulatory Visit (INDEPENDENT_AMBULATORY_CARE_PROVIDER_SITE_OTHER): Payer: Managed Care, Other (non HMO) | Admitting: Family Medicine

## 2011-07-08 DIAGNOSIS — Z86718 Personal history of other venous thrombosis and embolism: Secondary | ICD-10-CM

## 2011-07-08 DIAGNOSIS — Z7902 Long term (current) use of antithrombotics/antiplatelets: Secondary | ICD-10-CM

## 2011-07-08 DIAGNOSIS — Z5181 Encounter for therapeutic drug level monitoring: Secondary | ICD-10-CM

## 2011-07-08 DIAGNOSIS — I2699 Other pulmonary embolism without acute cor pulmonale: Secondary | ICD-10-CM

## 2011-07-08 LAB — POCT INR: INR: 2.7

## 2011-07-08 NOTE — Patient Instructions (Signed)
11.25 mg daily, except FRI/MON take 7.5 mg  Check in 2 weeks

## 2011-07-22 ENCOUNTER — Ambulatory Visit (INDEPENDENT_AMBULATORY_CARE_PROVIDER_SITE_OTHER): Payer: Managed Care, Other (non HMO) | Admitting: Family Medicine

## 2011-07-22 DIAGNOSIS — Z5181 Encounter for therapeutic drug level monitoring: Secondary | ICD-10-CM

## 2011-07-22 DIAGNOSIS — I2699 Other pulmonary embolism without acute cor pulmonale: Secondary | ICD-10-CM

## 2011-07-22 DIAGNOSIS — Z86718 Personal history of other venous thrombosis and embolism: Secondary | ICD-10-CM

## 2011-07-22 DIAGNOSIS — Z7902 Long term (current) use of antithrombotics/antiplatelets: Secondary | ICD-10-CM

## 2011-07-22 LAB — POCT INR: INR: 2.9

## 2011-07-22 NOTE — Patient Instructions (Signed)
Continue current dose, check in 4 weeks  

## 2011-08-20 ENCOUNTER — Ambulatory Visit: Payer: Managed Care, Other (non HMO)

## 2011-08-25 ENCOUNTER — Ambulatory Visit (INDEPENDENT_AMBULATORY_CARE_PROVIDER_SITE_OTHER): Payer: Managed Care, Other (non HMO) | Admitting: Family Medicine

## 2011-08-25 DIAGNOSIS — Z86718 Personal history of other venous thrombosis and embolism: Secondary | ICD-10-CM

## 2011-08-25 DIAGNOSIS — Z5181 Encounter for therapeutic drug level monitoring: Secondary | ICD-10-CM

## 2011-08-25 DIAGNOSIS — Z7902 Long term (current) use of antithrombotics/antiplatelets: Secondary | ICD-10-CM

## 2011-08-25 DIAGNOSIS — I2699 Other pulmonary embolism without acute cor pulmonale: Secondary | ICD-10-CM

## 2011-08-25 NOTE — Patient Instructions (Signed)
11.25 mg daily increased today 7.5 mg, check in 1 week

## 2011-09-01 ENCOUNTER — Ambulatory Visit: Payer: Managed Care, Other (non HMO)

## 2011-09-05 ENCOUNTER — Ambulatory Visit (INDEPENDENT_AMBULATORY_CARE_PROVIDER_SITE_OTHER): Payer: Managed Care, Other (non HMO) | Admitting: Family Medicine

## 2011-09-05 DIAGNOSIS — Z7902 Long term (current) use of antithrombotics/antiplatelets: Secondary | ICD-10-CM

## 2011-09-05 DIAGNOSIS — Z86718 Personal history of other venous thrombosis and embolism: Secondary | ICD-10-CM

## 2011-09-05 DIAGNOSIS — Z5181 Encounter for therapeutic drug level monitoring: Secondary | ICD-10-CM

## 2011-09-05 DIAGNOSIS — I2699 Other pulmonary embolism without acute cor pulmonale: Secondary | ICD-10-CM

## 2011-09-05 LAB — POCT INR: INR: 3

## 2011-09-05 NOTE — Patient Instructions (Signed)
Continue current dose, check in 2 weeks  

## 2011-09-19 ENCOUNTER — Other Ambulatory Visit: Payer: Self-pay

## 2011-09-19 ENCOUNTER — Ambulatory Visit (INDEPENDENT_AMBULATORY_CARE_PROVIDER_SITE_OTHER): Payer: Managed Care, Other (non HMO) | Admitting: Family Medicine

## 2011-09-19 DIAGNOSIS — Z7902 Long term (current) use of antithrombotics/antiplatelets: Secondary | ICD-10-CM

## 2011-09-19 DIAGNOSIS — Z86718 Personal history of other venous thrombosis and embolism: Secondary | ICD-10-CM

## 2011-09-19 DIAGNOSIS — Z5181 Encounter for therapeutic drug level monitoring: Secondary | ICD-10-CM

## 2011-09-19 DIAGNOSIS — I2699 Other pulmonary embolism without acute cor pulmonale: Secondary | ICD-10-CM

## 2011-09-19 LAB — POCT INR: INR: 2.4

## 2011-09-19 MED ORDER — WARFARIN SODIUM 7.5 MG PO TABS: ORAL_TABLET | ORAL | Status: DC

## 2011-09-19 MED ORDER — PAROXETINE HCL 40 MG PO TABS: 40.0000 mg | ORAL_TABLET | ORAL | Status: DC

## 2011-09-19 NOTE — Patient Instructions (Signed)
Continue current dose, check in 4 weeks  

## 2011-09-19 NOTE — Telephone Encounter (Signed)
Pt going out of town request refill today warfarin and paroxetine to [facility] [place]. Pt notified by phone med sent to pharmacy.

## 2011-10-20 ENCOUNTER — Ambulatory Visit: Payer: Managed Care, Other (non HMO)

## 2011-11-06 ENCOUNTER — Telehealth: Payer: Self-pay | Admitting: Critical Care Medicine

## 2011-11-06 NOTE — Telephone Encounter (Signed)
Called pt and left message x3 to make a follow up apt. No return call back. Sent letter 11/06/11 for pt to call and schd follow up.  °

## 2011-11-07 ENCOUNTER — Ambulatory Visit (INDEPENDENT_AMBULATORY_CARE_PROVIDER_SITE_OTHER): Payer: Managed Care, Other (non HMO) | Admitting: Family Medicine

## 2011-11-07 DIAGNOSIS — Z86718 Personal history of other venous thrombosis and embolism: Secondary | ICD-10-CM

## 2011-11-07 DIAGNOSIS — Z7902 Long term (current) use of antithrombotics/antiplatelets: Secondary | ICD-10-CM

## 2011-11-07 DIAGNOSIS — I2699 Other pulmonary embolism without acute cor pulmonale: Secondary | ICD-10-CM

## 2011-11-07 DIAGNOSIS — Z5181 Encounter for therapeutic drug level monitoring: Secondary | ICD-10-CM

## 2011-11-07 LAB — POCT INR: INR: 1.8

## 2011-11-07 NOTE — Patient Instructions (Signed)
Take 15 mg today   11.25 mg daily check in 1 week

## 2011-11-14 ENCOUNTER — Ambulatory Visit (INDEPENDENT_AMBULATORY_CARE_PROVIDER_SITE_OTHER): Payer: Managed Care, Other (non HMO) | Admitting: Family Medicine

## 2011-11-14 DIAGNOSIS — Z86718 Personal history of other venous thrombosis and embolism: Secondary | ICD-10-CM

## 2011-11-14 DIAGNOSIS — I2699 Other pulmonary embolism without acute cor pulmonale: Secondary | ICD-10-CM

## 2011-11-14 DIAGNOSIS — Z5181 Encounter for therapeutic drug level monitoring: Secondary | ICD-10-CM

## 2011-11-14 DIAGNOSIS — Z7902 Long term (current) use of antithrombotics/antiplatelets: Secondary | ICD-10-CM

## 2011-11-14 NOTE — Patient Instructions (Signed)
11.25 mg daily , except every Fri/ Mon take 15 mg. Check 2 weeks

## 2011-11-20 ENCOUNTER — Encounter: Payer: Self-pay | Admitting: Critical Care Medicine

## 2011-11-20 ENCOUNTER — Ambulatory Visit (INDEPENDENT_AMBULATORY_CARE_PROVIDER_SITE_OTHER): Payer: Managed Care, Other (non HMO) | Admitting: Critical Care Medicine

## 2011-11-20 VITALS — BP 140/90 | HR 76 | Temp 97.8°F | Ht 65.0 in | Wt 287.0 lb

## 2011-11-20 DIAGNOSIS — E669 Obesity, unspecified: Secondary | ICD-10-CM

## 2011-11-20 DIAGNOSIS — J90 Pleural effusion, not elsewhere classified: Secondary | ICD-10-CM

## 2011-11-20 DIAGNOSIS — G4733 Obstructive sleep apnea (adult) (pediatric): Secondary | ICD-10-CM | POA: Insufficient documentation

## 2011-11-20 DIAGNOSIS — Z9989 Dependence on other enabling machines and devices: Secondary | ICD-10-CM | POA: Insufficient documentation

## 2011-11-20 DIAGNOSIS — I2699 Other pulmonary embolism without acute cor pulmonale: Secondary | ICD-10-CM

## 2011-11-20 NOTE — Assessment & Plan Note (Signed)
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

## 2011-11-20 NOTE — Assessment & Plan Note (Signed)
Obstructive sleep apnea of moderate to severe diagnosis 2008 Full face mask with 14 cm water pressure  The patient is now lost over 100 pounds after bariatric surgery and on this basis we need to reconsider the need for sleep apnea treatment Plan Obtain apnea link sleep study at home on room air without CPAP therapy in place

## 2011-11-20 NOTE — Patient Instructions (Addendum)
A home sleep study will be obtained off cpap No change in medications. Return in           12 months

## 2011-11-20 NOTE — Assessment & Plan Note (Signed)
Stable chronic left loculated pleural effusion Plan Monitor

## 2011-11-20 NOTE — Progress Notes (Signed)
Subjective:    Patient ID: [name], female    DOB: 1965/11/15, 46 y.o.   MRN: [id number]  HPI  46 y.o. wf last smoked around 06/2009 with chronic L empyema s/p VATS 2010.    04/24/2011 Now down to pred 5mg /d   .  Had to go slowly.  No new pulm issues.  No real cough Pt denies any significant sore throat, nasal congestion or excess secretions, fever, chills, sweats, unintended weight loss, pleurtic or exertional chest pain, orthopnea PND, or leg swelling Pt denies any increase in rescue therapy over baseline, denies waking up needing it or having any early am or nocturnal exacerbations of coughing/wheezing/or dyspnea. Pt also denies any obvious fluctuation in symptoms with  weather or environmental change or other alleviating or aggravating factors   11/20/2011 Not wearing cpap. No daytime hypersomnolence. No cough or chest pain. No wheeze Pt denies any significant sore throat, nasal congestion or excess secretions, fever, chills, sweats, unintended weight loss, pleurtic or exertional chest pain, orthopnea PND, or leg swelling Pt denies any increase in rescue therapy over baseline, denies waking up needing it or having any early am or nocturnal exacerbations of coughing/wheezing/or dyspnea. Pt also denies any obvious fluctuation in symptoms with  weather or environmental change or other alleviating or aggravating factors  Past Medical History  Diagnosis Date  . Nonspecific abnormal results of liver function study   . Personal history of venous thrombosis and embolism 2002    during pregnancy; ?factor 5 leiden (sees heme)  . Edema   . 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 Nephritis and nephropathy, not specified as acute or chronic, with unspecified pathological lesion in kidney   . Pure hypercholesterolemia   . Unspecified essential hypertension   . Other specified acquired hypothyroidism   . Obesity, unspecified   . Panic  disorder without agoraphobia   . Unspecified pleural effusion   . Other pulmonary embolism and infarction   . 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 Pleural effusion 2005    c/w lupus initial w/u; recurrent Right as pred tapered off July 2011  . HLD (hyperlipidemia)   . Iron deficiency anemia     IV dextran Coladonato  . 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 GERD (gastroesophageal reflux disease)     on prilosec r/t gastric sleeve surgery  . Arthritis     Lupus - hands/knees  . Anxiety state, unspecified     panic attacks  . Sleep apnea      Family History  Problem Relation Age of Onset  . Lupus Sister   . Diabetes      GM     History   Social History  . Marital Status: Divorced    Spouse Name: N/A    Number of Children: 3  . Years of Education: N/A   Occupational History  . Trainer at [employer]    Social History Main Topics  . Smoking status: Former Smoker -- 0.5 packs/day for 10 years    Types: Cigarettes    Quit date: 05/04/2009  . Smokeless tobacco: Never Used   Comment: Socially x10 years (1 pack/month)  . Alcohol Use: Yes     socially  . Drug Use: No  . Sexually Active: Yes    Birth Control/ Protection: Condom   Other Topics Concern  .  Not on file   Social History Narrative  . No narrative on file     Allergies  Allergen Reactions  . Amoxicillin     REACTION: Pt. reports allergy, reaction not known  . Atorvastatin     REACTION: Reaction not known  . Moxifloxacin     REACTION: hallucinations     Outpatient Prescriptions Prior to Visit  Medication Sig Dispense Refill  . Calcium Carb-Cholecalciferol (CALCIUM 1000 + D PO) Take 1 tablet by mouth daily.        . cholecalciferol (VITAMIN D) 1000 UNITS tablet Take 1,000 Units by mouth daily.        Marland Kitchen levothyroxine (SYNTHROID, LEVOTHROID) 175 MCG tablet Take 1 tablet (175 mcg total) by mouth daily.  90 tablet  3  . Multiple Vitamin  (MULTIVITAMIN) tablet Take 1 tablet by mouth daily.        Marland Kitchen PARoxetine (PAXIL) 40 MG tablet Take 1 tablet (40 mg total) by mouth every morning.  30 tablet  3  . predniSONE (DELTASONE) 1 MG tablet Take 5 mg by mouth daily.       Marland Kitchen warfarin (COUMADIN) 7.5 MG tablet Take po as directed  90 tablet  1  . omeprazole (PRILOSEC) 20 MG capsule Take 1 capsule (20 mg total) by mouth 2 (two) times daily.  60 capsule  3  . clobetasol (TEMOVATE) 0.05 % ointment Apply 1 application topically as needed.        . ferrous fumarate (HEMOCYTE - 106 MG FE) 325 (106 FE) MG TABS Take 1 tablet by mouth daily.           Review of Systems  Constitutional:   No  weight loss, night sweats,  Fevers, chills, fatigue, lassitude. HEENT:   No headaches,  Difficulty swallowing,  Tooth/dental problems,  Sore throat,                No sneezing, itching, ear ache, nasal congestion, post nasal drip,   CV:  No chest pain,  Orthopnea, PND, swelling in lower extremities, anasarca, dizziness, palpitations  GI  No heartburn, indigestion, abdominal pain, nausea, vomiting, diarrhea, change in bowel habits, loss of appetite  Resp: No shortness of breath with exertion or at rest.  No excess mucus, no productive cough,  No non-productive cough,  No coughing up of blood.  No change in color of mucus.  No wheezing.  No chest wall deformity  Skin: no rash or lesions.  GU: no dysuria, change in color of urine, no urgency or frequency.  No flank pain.  MS:  No joint pain or swelling.  No decreased range of motion.  No back pain.  Psych:  No change in mood or affect. No depression or anxiety.  No memory loss.     Objective:   Physical Exam   Filed Vitals:   11/20/11 0943  BP: 140/90  Pulse: 76  Temp: 97.8 F (36.6 C)  TempSrc: Oral  Height: 5\' 5"  (1.651 m)  Weight: 287 lb (130.182 kg)  SpO2: 97%    Gen: Pleasant, well-nourished, in no distress,  normal affect  ENT: No lesions,  mouth clear,  oropharynx clear, no  postnasal drip  Neck: No JVD, no TMG, no carotid bruits  Lungs: No use of accessory muscles, no dullness to percussion, decreased BS 1/4 way up on L  Cardiovascular: RRR, heart sounds normal, no murmur or gallops, no peripheral edema  Abdomen: soft and NT, no HSM,  BS normal  Musculoskeletal: No  deformities, no cyanosis or clubbing  Neuro: alert, non focal  Skin: Warm, no lesions or rashes      Assessment & Plan:   PLEURAL EFFUSION Stable chronic left loculated pleural effusion Plan Monitor  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 Calcium Carb-Cholecalciferol (CALCIUM 1000 + D PO) Take 1 tablet by mouth daily.        . cholecalciferol (VITAMIN D) 1000 UNITS tablet Take 1,000 Units by mouth daily.        Marland Kitchen levothyroxine (SYNTHROID, LEVOTHROID) 175 MCG tablet Take 1 tablet (175 mcg total) by mouth daily.  90 tablet  3  . Multiple Vitamin (MULTIVITAMIN) tablet Take 1 tablet by mouth daily.        Marland Kitchen omeprazole (PRILOSEC) 20 MG capsule Take 20 mg by mouth daily.      Marland Kitchen PARoxetine (PAXIL) 40 MG tablet Take 1 tablet (40 mg total) by mouth every morning.  30 tablet  3  . predniSONE (DELTASONE) 1 MG tablet Take 5 mg by mouth daily.       Marland Kitchen warfarin (COUMADIN) 7.5 MG tablet Take po as directed  90 tablet  1  . DISCONTD: omeprazole (PRILOSEC) 20 MG capsule Take 1 capsule (20 mg total) by mouth 2 (two) times daily.  60 capsule  3  .  DISCONTD: clobetasol (TEMOVATE) 0.05 % ointment Apply 1 application topically as needed.        Marland Kitchen DISCONTD: ferrous fumarate (HEMOCYTE - 106 MG FE) 325 (106 FE) MG TABS Take 1 tablet by mouth daily.

## 2011-11-25 ENCOUNTER — Telehealth: Payer: Self-pay | Admitting: *Deleted

## 2011-11-25 NOTE — Telephone Encounter (Signed)
Pt has been approved for Home Sleep Study, after numerous attempts to reach by phone, email has been sent to patient to contact [facility] .Kandice Hams

## 2011-11-28 ENCOUNTER — Ambulatory Visit (INDEPENDENT_AMBULATORY_CARE_PROVIDER_SITE_OTHER): Payer: Managed Care, Other (non HMO) | Admitting: Family Medicine

## 2011-11-28 ENCOUNTER — Telehealth: Payer: Self-pay | Admitting: Family Medicine

## 2011-11-28 DIAGNOSIS — I2699 Other pulmonary embolism without acute cor pulmonale: Secondary | ICD-10-CM

## 2011-11-28 DIAGNOSIS — Z5181 Encounter for therapeutic drug level monitoring: Secondary | ICD-10-CM

## 2011-11-28 DIAGNOSIS — Z7902 Long term (current) use of antithrombotics/antiplatelets: Secondary | ICD-10-CM

## 2011-11-28 DIAGNOSIS — Z86718 Personal history of other venous thrombosis and embolism: Secondary | ICD-10-CM

## 2011-11-28 MED ORDER — WARFARIN SODIUM 1 MG PO TABS: 1.0000 mg | ORAL_TABLET | ORAL | Status: DC

## 2011-11-28 MED ORDER — WARFARIN SODIUM 10 MG PO TABS: 10.0000 mg | ORAL_TABLET | Freq: Every day | ORAL | Status: DC

## 2011-11-28 NOTE — Patient Instructions (Signed)
11.25 mg daily , except 13.0 mg Tue/Thur 9 days - patient out of town

## 2011-11-28 NOTE — Telephone Encounter (Signed)
Changed dose of warfarin as directed by Terri and Dr. Milinda Antis

## 2011-12-08 ENCOUNTER — Ambulatory Visit (INDEPENDENT_AMBULATORY_CARE_PROVIDER_SITE_OTHER): Payer: Managed Care, Other (non HMO) | Admitting: Family Medicine

## 2011-12-08 DIAGNOSIS — Z7902 Long term (current) use of antithrombotics/antiplatelets: Secondary | ICD-10-CM

## 2011-12-08 DIAGNOSIS — I2699 Other pulmonary embolism without acute cor pulmonale: Secondary | ICD-10-CM

## 2011-12-08 DIAGNOSIS — Z5181 Encounter for therapeutic drug level monitoring: Secondary | ICD-10-CM

## 2011-12-08 DIAGNOSIS — Z86718 Personal history of other venous thrombosis and embolism: Secondary | ICD-10-CM

## 2011-12-08 NOTE — Patient Instructions (Addendum)
Continue 11.25 mg daily , except 13.0 mg Tue/Thur recheck 3 weeks (pt will be out of town for a few weeks)

## 2011-12-10 ENCOUNTER — Ambulatory Visit (INDEPENDENT_AMBULATORY_CARE_PROVIDER_SITE_OTHER): Payer: Managed Care, Other (non HMO) | Admitting: Pulmonary Disease

## 2011-12-10 ENCOUNTER — Telehealth: Payer: Self-pay | Admitting: Pulmonary Disease

## 2011-12-10 DIAGNOSIS — G4733 Obstructive sleep apnea (adult) (pediatric): Secondary | ICD-10-CM

## 2011-12-10 DIAGNOSIS — Z9989 Dependence on other enabling machines and devices: Secondary | ICD-10-CM

## 2011-12-10 NOTE — Telephone Encounter (Signed)
Called, spoke with pt.  Informed her of below per Dr. Delford Field.  She verbalized understanding and is aware order will be placed to have have DME do autoset cpap titration with download.

## 2011-12-10 NOTE — Telephone Encounter (Signed)
Home study shows OSA of severe degree inspite of wt loss. Consider resume cpap with auto titration since may need lower pressure.

## 2011-12-10 NOTE — Telephone Encounter (Signed)
pls ask home care company to do autoset cpap 5-20 titration with download  Call pt and tell her sleep study shows she still needs cpap

## 2011-12-29 ENCOUNTER — Ambulatory Visit: Payer: Managed Care, Other (non HMO)

## 2012-01-23 ENCOUNTER — Other Ambulatory Visit (INDEPENDENT_AMBULATORY_CARE_PROVIDER_SITE_OTHER): Payer: Managed Care, Other (non HMO)

## 2012-01-23 ENCOUNTER — Telehealth: Payer: Self-pay | Admitting: Critical Care Medicine

## 2012-01-23 DIAGNOSIS — Z9989 Dependence on other enabling machines and devices: Secondary | ICD-10-CM

## 2012-01-23 DIAGNOSIS — Z7901 Long term (current) use of anticoagulants: Secondary | ICD-10-CM

## 2012-01-23 DIAGNOSIS — I82409 Acute embolism and thrombosis of unspecified deep veins of unspecified lower extremity: Secondary | ICD-10-CM

## 2012-01-23 DIAGNOSIS — I2699 Other pulmonary embolism without acute cor pulmonale: Secondary | ICD-10-CM

## 2012-01-23 DIAGNOSIS — G4733 Obstructive sleep apnea (adult) (pediatric): Secondary | ICD-10-CM

## 2012-01-23 DIAGNOSIS — O2230 Deep phlebothrombosis in pregnancy, unspecified trimester: Secondary | ICD-10-CM

## 2012-01-23 LAB — PROTIME-INR: INR: 2.9 ratio — ABNORMAL HIGH (ref 0.8–1.0)

## 2012-01-23 NOTE — Telephone Encounter (Signed)
Order faxed to [facility] to set cpap on 14cm Tobe Sos

## 2012-01-23 NOTE — Telephone Encounter (Signed)
Call pt and tell her download shows 14cm20 is the optimal setting on cpap and send order to [facility] to make cpap at 14cm20

## 2012-01-26 ENCOUNTER — Ambulatory Visit (INDEPENDENT_AMBULATORY_CARE_PROVIDER_SITE_OTHER): Payer: Managed Care, Other (non HMO) | Admitting: General Practice

## 2012-01-26 DIAGNOSIS — I2699 Other pulmonary embolism without acute cor pulmonale: Secondary | ICD-10-CM

## 2012-01-26 DIAGNOSIS — Z7901 Long term (current) use of anticoagulants: Secondary | ICD-10-CM

## 2012-01-26 DIAGNOSIS — Z86718 Personal history of other venous thrombosis and embolism: Secondary | ICD-10-CM

## 2012-01-29 ENCOUNTER — Other Ambulatory Visit: Payer: Self-pay | Admitting: Family Medicine

## 2012-01-29 NOTE — Telephone Encounter (Signed)
Spoke with pt and she requested Rx be sent to the [facility] in [place] because that is where pt is for holidays

## 2012-02-09 ENCOUNTER — Encounter: Payer: Self-pay | Admitting: Critical Care Medicine

## 2012-02-23 ENCOUNTER — Ambulatory Visit (INDEPENDENT_AMBULATORY_CARE_PROVIDER_SITE_OTHER): Payer: Managed Care, Other (non HMO) | Admitting: Family Medicine

## 2012-02-23 ENCOUNTER — Ambulatory Visit (INDEPENDENT_AMBULATORY_CARE_PROVIDER_SITE_OTHER): Payer: Managed Care, Other (non HMO) | Admitting: General Practice

## 2012-02-23 ENCOUNTER — Encounter: Payer: Self-pay | Admitting: Family Medicine

## 2012-02-23 VITALS — BP 140/96 | HR 101 | Temp 98.4°F | Ht 65.0 in | Wt 292.2 lb

## 2012-02-23 DIAGNOSIS — I2699 Other pulmonary embolism without acute cor pulmonale: Secondary | ICD-10-CM

## 2012-02-23 DIAGNOSIS — M542 Cervicalgia: Secondary | ICD-10-CM | POA: Insufficient documentation

## 2012-02-23 DIAGNOSIS — Z86718 Personal history of other venous thrombosis and embolism: Secondary | ICD-10-CM

## 2012-02-23 DIAGNOSIS — Z7901 Long term (current) use of anticoagulants: Secondary | ICD-10-CM

## 2012-02-23 MED ORDER — CYCLOBENZAPRINE HCL 10 MG PO TABS: 10.0000 mg | ORAL_TABLET | Freq: Three times a day (TID) | ORAL | Status: DC | PRN

## 2012-02-23 MED ORDER — WARFARIN SODIUM 1 MG PO TABS: 1.0000 mg | ORAL_TABLET | ORAL | Status: DC

## 2012-02-23 NOTE — Progress Notes (Signed)
Subjective:    Patient ID: [name], female    DOB: 1965/07/20, 47 y.o.   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  Marland Kitchen Patient Active Problem List  Diagnosis  . HYPOTHYROIDISM, POSTABLATION  . HYPERCHOLESTEROLEMIA  . OBESITY  . ANXIETY  . PANIC DISORDER  . HYPERTENSION, ESSENTIAL NOS  . PULMONARY EMBOLISM  . PLEURAL EFFUSION  . GLOMERULONEPHRITIS  . SYSTEMIC LUPUS ERYTHEMATOSUS  . EDEMA  . ENLARGEMENT OF LYMPH NODES  . DVT, HX OF  . Hyperglycemia  . OSA on CPAP   Past Medical History  Diagnosis Date  . Nonspecific abnormal results of liver function study   . Personal history of venous thrombosis and embolism 2002    during pregnancy; ?factor 5 leiden (sees heme)  . Edema   . 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 Nephritis and nephropathy, not specified as acute or chronic, with unspecified pathological lesion in kidney   . Pure hypercholesterolemia   . Unspecified essential hypertension   . Other specified acquired hypothyroidism   . Obesity, unspecified   . Panic disorder without agoraphobia   . Unspecified pleural effusion   . Other pulmonary embolism and infarction   . 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 Pleural effusion 2005    c/w lupus initial w/u; recurrent Right as  pred tapered off July 2011  . HLD (hyperlipidemia)   . Iron deficiency anemia     IV dextran Coladonato  . 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 GERD (gastroesophageal reflux disease)     on prilosec r/t gastric sleeve surgery  . Arthritis     Lupus - hands/knees  . Anxiety state, unspecified     panic attacks  . Sleep apnea    Past Surgical History  Procedure Date  . Pleuryx catheter placement 9/11     VanTright----removed 9/11  . Thoracentesis 2010    with penumonia  . Pleurocentesis 10/2004  . Gastric sleeve 8/12    bariatric surgery Dr Adolphus Birchwood   . Wisdom tooth extraction   . Svd     x 3  . Laparoscopic tubal ligation 12/09/2010    Procedure: LAPAROSCOPIC TUBAL LIGATION;  Surgeon: Juluis Mire;  Location: [facility];  Service: Gynecology;  Laterality: Bilateral;  Attempted see Nursing note   History  Substance Use Topics  . Smoking status: Former Smoker -- 0.5 packs/day for 10 years    Types: Cigarettes    Quit date: 05/04/2009  . Smokeless tobacco: Never Used     Comment: Socially x10 years (1 pack/month)  .  Alcohol Use: Yes     Comment: socially   Family History  Problem Relation Age of Onset  . Lupus Sister   . Diabetes      GM   Allergies  Allergen Reactions  . Amoxicillin     REACTION: Pt. reports allergy, reaction not known  . Atorvastatin     REACTION: Reaction not known  . Moxifloxacin     REACTION: hallucinations   Current Outpatient Prescriptions on File Prior to Visit  Medication Sig Dispense Refill  . Calcium Carb-Cholecalciferol (CALCIUM 1000 + D PO) Take 1 tablet by mouth daily.        . cholecalciferol (VITAMIN D) 1000 UNITS tablet Take 1,000 Units by mouth daily.        Marland Kitchen levothyroxine (SYNTHROID, LEVOTHROID) 175 MCG tablet Take 1 tablet (175 mcg total) by mouth daily.  90 tablet  3  . Multiple Vitamin (MULTIVITAMIN) tablet Take 1 tablet by mouth daily.        Marland Kitchen omeprazole (PRILOSEC) 20 MG capsule Take 20 mg by mouth daily.      Marland Kitchen  PARoxetine (PAXIL) 40 MG tablet take 1 tablet by mouth every morning  30 tablet  5  . predniSONE (DELTASONE) 1 MG tablet Take 5 mg by mouth daily.       Marland Kitchen warfarin (COUMADIN) 1 MG tablet Take 1 tablet (1 mg total) by mouth as directed.  60 tablet  3  . warfarin (COUMADIN) 10 MG tablet Take 1 tablet (10 mg total) by mouth daily.  60 tablet  3     Review of Systems Review of Systems  Constitutional: Negative for fever, appetite change, fatigue and unexpected weight change.  Eyes: Negative for pain and visual disturbance.  Respiratory: Negative for cough and shortness of breath.   Cardiovascular: Negative for cp or palpitations    Gastrointestinal: Negative for nausea, diarrhea and constipation.  Genitourinary: Negative for urgency and frequency.  Skin: Negative for pallor or rash   MSK pos for neck pain, neg for joint swelling  Neurological: Negative for weakness, light-headedness, numbness and headaches.  Hematological: Negative for adenopathy. Does not bruise/bleed easily.  Psychiatric/Behavioral: Negative for dysphoric mood. The patient is not nervous/anxious.         Objective:   Physical Exam  Constitutional: She appears well-developed and well-nourished. No distress.       obese and well appearing   HENT:  Head: Normocephalic and atraumatic.  Mouth/Throat: Oropharynx is clear and moist.  Eyes: Conjunctivae normal and EOM are normal. Pupils are equal, round, and reactive to light.  Neck: Normal range of motion. Neck supple. No JVD present. No thyromegaly present.       bilat trapezius tenderness worse on the R side with spasm  Pain on flexing neck 30 deg/ ext is full and pain on rot to the L more than R No bony tenderness No crepitus  Cardiovascular: Normal rate and regular rhythm.   Pulmonary/Chest: Effort normal and breath sounds normal.  Musculoskeletal: She exhibits tenderness. She exhibits no edema.  Lymphadenopathy:    She has no cervical adenopathy.  Neurological:  She is alert. She has normal reflexes. She displays no atrophy and no tremor. No cranial nerve deficit or sensory deficit. She exhibits normal muscle tone. Coordination normal.  Skin: Skin is warm and dry. No rash noted. No erythema. No pallor.  Psychiatric: She has a normal mood and affect.          Assessment & Plan:

## 2012-02-23 NOTE — Patient Instructions (Addendum)
Use heat for 10 minutes at a time on neck  Keep stretching We will refer to PT at check out  Try the flexeril - it will sedate so be careful (muscle relaxer)

## 2012-02-23 NOTE — Assessment & Plan Note (Signed)
With tension and spasm - no bony tenderness or neuro s/s tx with flexeril/ heat/ cervical supp pillow and PT  If no imp consider films and further eval See AVS

## 2012-03-22 ENCOUNTER — Ambulatory Visit: Payer: Managed Care, Other (non HMO)

## 2012-03-28 ENCOUNTER — Other Ambulatory Visit: Payer: Self-pay | Admitting: Family Medicine

## 2012-03-29 NOTE — Telephone Encounter (Signed)
Please call to re schedule INR and refil med for 6 mo , thanks

## 2012-03-29 NOTE — Telephone Encounter (Signed)
Ok to refill? Pt no-showed last INR appt on 03/22/12, and has no future appts

## 2012-03-30 NOTE — Telephone Encounter (Signed)
appt scheduled and meds refilled 

## 2012-04-01 ENCOUNTER — Ambulatory Visit (INDEPENDENT_AMBULATORY_CARE_PROVIDER_SITE_OTHER): Payer: Managed Care, Other (non HMO) | Admitting: General Practice

## 2012-04-01 DIAGNOSIS — Z7902 Long term (current) use of antithrombotics/antiplatelets: Secondary | ICD-10-CM

## 2012-04-01 DIAGNOSIS — Z86718 Personal history of other venous thrombosis and embolism: Secondary | ICD-10-CM

## 2012-04-01 DIAGNOSIS — I2699 Other pulmonary embolism without acute cor pulmonale: Secondary | ICD-10-CM

## 2012-04-01 DIAGNOSIS — Z5181 Encounter for therapeutic drug level monitoring: Secondary | ICD-10-CM

## 2012-04-01 LAB — POCT INR: INR: 2.8

## 2012-04-07 ENCOUNTER — Encounter: Payer: Self-pay | Admitting: Family Medicine

## 2012-04-07 ENCOUNTER — Ambulatory Visit (INDEPENDENT_AMBULATORY_CARE_PROVIDER_SITE_OTHER): Payer: Managed Care, Other (non HMO) | Admitting: Family Medicine

## 2012-04-07 VITALS — BP 144/96 | HR 99 | Temp 99.1°F | Ht 65.0 in | Wt 286.5 lb

## 2012-04-07 DIAGNOSIS — A088 Other specified intestinal infections: Secondary | ICD-10-CM

## 2012-04-07 DIAGNOSIS — A084 Viral intestinal infection, unspecified: Secondary | ICD-10-CM | POA: Insufficient documentation

## 2012-04-07 NOTE — Assessment & Plan Note (Signed)
In pt with hx of bariatric surgery-so will watch carefully for dehydration Disc sympt care-tylenol  Rest and sips of fluids all day - adv diet to crackers as she feels better  inst if worse ha or any neck stiffness to seek urgent attention at ER Will update if worse or no imp in several days

## 2012-04-07 NOTE — Patient Instructions (Addendum)
I think you have viral gastroenteritis - and it should get better with time  We want to prevent dehydration- so keep sipping fluids all day - advance diet once you feel better  Tylenol is ok for fever  If your headache becomes severe or you get a stiff neck- let us know and go on to there ER

## 2012-04-07 NOTE — Progress Notes (Signed)
Subjective:    Patient ID: [name], female    DOB: [birth year], 47 y.o.   MRN: [id number]  HPI Here with fever and malaise  Woke up Sunday feeling horrible - achey all over with fever that went up to 102 on Monday  Took tylenol every 4-6 hours (helped some )  Nausea -no vomiting and no appetite (ate some crackers/ toast) , getting fluids Diarrhea kicked in yesterday  Has had headache for a while  No stiff neck  Is drinking water and stays away from caffeine  Lupus is acting up -some more joint pain -not uncommon when she gets sick  No coughing  A little mild scratchy throat  Little to no sinus congestion  Had a flu shot this year   There have been sick people at work - with GI virus  Patient Active Problem List  Diagnosis  . HYPOTHYROIDISM, POSTABLATION  . HYPERCHOLESTEROLEMIA  . OBESITY  . ANXIETY  . PANIC DISORDER  . HYPERTENSION, ESSENTIAL NOS  . PULMONARY EMBOLISM  . PLEURAL EFFUSION  . GLOMERULONEPHRITIS  . SYSTEMIC LUPUS ERYTHEMATOSUS  . EDEMA  . ENLARGEMENT OF LYMPH NODES  . DVT, HX OF  . Hyperglycemia  . OSA on CPAP  . Neck pain   Past Medical History  Diagnosis Date  . Nonspecific abnormal results of liver function study   . Personal history of venous thrombosis and embolism 2002    during pregnancy; ?factor 5 leiden (sees heme)  . Edema   . 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 Nephritis and nephropathy, not specified as acute or chronic, with unspecified pathological lesion in kidney   . Pure hypercholesterolemia   . Unspecified essential hypertension   . Other specified acquired hypothyroidism   . Obesity, unspecified   . Panic disorder without agoraphobia   . Unspecified pleural effusion   . Other pulmonary embolism and infarction   . 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 Pleural effusion 2005    c/w lupus initial w/u; recurrent Right as pred  tapered off July 2011  . HLD (hyperlipidemia)   . Iron deficiency anemia     IV dextran Coladonato  . 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 GERD (gastroesophageal reflux disease)     on prilosec r/t gastric sleeve surgery  . Arthritis     Lupus - hands/knees  . Anxiety state, unspecified     panic attacks  . Sleep apnea    Past Surgical History  Procedure Laterality Date  . Pleuryx catheter placement  9/11     VanTright----removed 9/11  . Thoracentesis  2010    with penumonia  . Pleurocentesis  10/2004  . Gastric sleeve  8/12    bariatric surgery Dr Adolphus Birchwood   . Wisdom tooth extraction    . Svd      x 3  . Laparoscopic tubal ligation  12/09/2010    Procedure: LAPAROSCOPIC TUBAL LIGATION;  Surgeon: Juluis Mire;  Location: [facility];  Service: Gynecology;  Laterality: Bilateral;  Attempted see Nursing note   History  Substance Use Topics  . Smoking status: Former Smoker -- 0.50 packs/day for 10 years    Types: Cigarettes    Quit date: 05/04/2009  . Smokeless tobacco: Never Used     Comment: Socially x10 years (1 pack/month)  . Alcohol Use: Yes  Comment: socially   Family History  Problem Relation Age of Onset  . Lupus Sister   . Diabetes      GM   Allergies  Allergen Reactions  . Amoxicillin     REACTION: Pt. reports allergy, reaction not known  . Atorvastatin     REACTION: Reaction not known  . Moxifloxacin     REACTION: hallucinations   Current Outpatient Prescriptions on File Prior to Visit  Medication Sig Dispense Refill  . Calcium Carb-Cholecalciferol (CALCIUM 1000 + D PO) Take 1 tablet by mouth daily.        . cholecalciferol (VITAMIN D) 1000 UNITS tablet Take 1,000 Units by mouth daily.        . cyclobenzaprine (FLEXERIL) 10 MG tablet Take 1 tablet (10 mg total) by mouth 3 (three) times daily as needed for muscle spasms.  30 tablet  0  . levothyroxine (SYNTHROID, LEVOTHROID) 175 MCG tablet Take 1 tablet (175 mcg total) by mouth daily.  90  tablet  3  . Multiple Vitamin (MULTIVITAMIN) tablet Take 1 tablet by mouth daily.        Marland Kitchen omeprazole (PRILOSEC) 20 MG capsule Take 20 mg by mouth daily.      Marland Kitchen PARoxetine (PAXIL) 40 MG tablet take 1 tablet by mouth every morning  30 tablet  5  . predniSONE (DELTASONE) 1 MG tablet Take 5 mg by mouth daily.       Marland Kitchen warfarin (COUMADIN) 1 MG tablet Take 1 tablet (1 mg total) by mouth as directed.  60 tablet  3  . warfarin (COUMADIN) 10 MG tablet Take 1 tablet (10 mg total) by mouth daily.  60 tablet  3  . warfarin (COUMADIN) 7.5 MG tablet take 1 tablet by mouth as directed by prescriber  90 tablet  1   No current facility-administered medications on file prior to visit.    Review of Systems Review of Systems  Constitutional: Negative for fever, appetite change, fatigue and unexpected weight change.  Eyes: Negative for pain and visual disturbance.  Respiratory: Negative for cough and shortness of breath.   Cardiovascular: Negative for cp or palpitations    Gastrointestinal: Negative for constipation or blood in stool or abd pain , pos for bloating  Genitourinary: Negative for urgency and frequency.neg for blood in urine   Skin: Negative for pallor or rash   Neurological: Negative for weakness, light-headedness, numbness and headaches.  Hematological: Negative for adenopathy. Does not bruise/bleed easily.  Psychiatric/Behavioral: Negative for dysphoric mood. The patient is not nervous/anxious.         Objective:   Physical Exam  Constitutional: She appears well-developed and well-nourished. No distress.  HENT:  Head: Normocephalic and atraumatic.  Mouth/Throat: Oropharynx is clear and moist.  Eyes: Conjunctivae and EOM are normal. Pupils are equal, round, and reactive to light.  Neck: Normal range of motion. Neck supple.  Cardiovascular: Regular rhythm and intact distal pulses.   Pulmonary/Chest: Effort normal and breath sounds normal. No respiratory distress. She has no wheezes.   Abdominal: Soft. She exhibits no distension and no mass. There is no tenderness. There is no rebound and no guarding.  bs are overactive but not high pitched or tinkling   Musculoskeletal: She exhibits no edema.  Lymphadenopathy:    She has no cervical adenopathy.  Neurological: She is alert. She has normal reflexes.  Skin: Skin is warm and dry. No rash noted. No erythema. No pallor.  Brisk cap refil , nl turgor  Psychiatric: She has  a normal mood and affect.          Assessment & Plan:

## 2012-04-15 ENCOUNTER — Ambulatory Visit: Payer: Managed Care, Other (non HMO)

## 2012-04-16 ENCOUNTER — Ambulatory Visit (INDEPENDENT_AMBULATORY_CARE_PROVIDER_SITE_OTHER): Payer: Managed Care, Other (non HMO) | Admitting: Family Medicine

## 2012-04-16 ENCOUNTER — Emergency Department (HOSPITAL_COMMUNITY)
Admission: EM | Admit: 2012-04-16 | Discharge: 2012-04-16 | Disposition: A | Discharge status: Home / Self Care | Payer: Managed Care, Other (non HMO) | Attending: Emergency Medicine | Admitting: Emergency Medicine

## 2012-04-16 ENCOUNTER — Encounter: Payer: Self-pay | Admitting: Family Medicine

## 2012-04-16 ENCOUNTER — Telehealth: Payer: Self-pay | Admitting: Family Medicine

## 2012-04-16 ENCOUNTER — Encounter (HOSPITAL_COMMUNITY): Payer: Self-pay | Admitting: *Deleted

## 2012-04-16 VITALS — BP 146/96 | HR 100 | Temp 98.4°F | Ht 65.0 in | Wt 283.5 lb

## 2012-04-16 DIAGNOSIS — Z9884 Bariatric surgery status: Secondary | ICD-10-CM | POA: Insufficient documentation

## 2012-04-16 DIAGNOSIS — Z8659 Personal history of other mental and behavioral disorders: Secondary | ICD-10-CM | POA: Insufficient documentation

## 2012-04-16 DIAGNOSIS — Z8709 Personal history of other diseases of the respiratory system: Secondary | ICD-10-CM | POA: Insufficient documentation

## 2012-04-16 DIAGNOSIS — Z862 Personal history of diseases of the blood and blood-forming organs and certain disorders involving the immune mechanism: Secondary | ICD-10-CM | POA: Insufficient documentation

## 2012-04-16 DIAGNOSIS — Z8742 Personal history of other diseases of the female genital tract: Secondary | ICD-10-CM | POA: Insufficient documentation

## 2012-04-16 DIAGNOSIS — Z7901 Long term (current) use of anticoagulants: Secondary | ICD-10-CM | POA: Insufficient documentation

## 2012-04-16 DIAGNOSIS — M329 Systemic lupus erythematosus, unspecified: Secondary | ICD-10-CM | POA: Insufficient documentation

## 2012-04-16 DIAGNOSIS — Z86718 Personal history of other venous thrombosis and embolism: Secondary | ICD-10-CM | POA: Insufficient documentation

## 2012-04-16 DIAGNOSIS — Z87891 Personal history of nicotine dependence: Secondary | ICD-10-CM | POA: Insufficient documentation

## 2012-04-16 DIAGNOSIS — Z86711 Personal history of pulmonary embolism: Secondary | ICD-10-CM | POA: Insufficient documentation

## 2012-04-16 DIAGNOSIS — R51 Headache: Secondary | ICD-10-CM | POA: Insufficient documentation

## 2012-04-16 DIAGNOSIS — R82998 Other abnormal findings in urine: Secondary | ICD-10-CM

## 2012-04-16 DIAGNOSIS — Z872 Personal history of diseases of the skin and subcutaneous tissue: Secondary | ICD-10-CM | POA: Insufficient documentation

## 2012-04-16 DIAGNOSIS — R112 Nausea with vomiting, unspecified: Secondary | ICD-10-CM

## 2012-04-16 DIAGNOSIS — IMO0002 Reserved for concepts with insufficient information to code with codable children: Secondary | ICD-10-CM | POA: Insufficient documentation

## 2012-04-16 DIAGNOSIS — I10 Essential (primary) hypertension: Secondary | ICD-10-CM | POA: Insufficient documentation

## 2012-04-16 DIAGNOSIS — Z9889 Other specified postprocedural states: Secondary | ICD-10-CM | POA: Insufficient documentation

## 2012-04-16 DIAGNOSIS — E860 Dehydration: Secondary | ICD-10-CM | POA: Insufficient documentation

## 2012-04-16 DIAGNOSIS — R8290 Unspecified abnormal findings in urine: Secondary | ICD-10-CM | POA: Insufficient documentation

## 2012-04-16 DIAGNOSIS — Z8639 Personal history of other endocrine, nutritional and metabolic disease: Secondary | ICD-10-CM | POA: Insufficient documentation

## 2012-04-16 DIAGNOSIS — Z8669 Personal history of other diseases of the nervous system and sense organs: Secondary | ICD-10-CM | POA: Insufficient documentation

## 2012-04-16 DIAGNOSIS — Z79899 Other long term (current) drug therapy: Secondary | ICD-10-CM | POA: Insufficient documentation

## 2012-04-16 DIAGNOSIS — K219 Gastro-esophageal reflux disease without esophagitis: Secondary | ICD-10-CM | POA: Insufficient documentation

## 2012-04-16 DIAGNOSIS — E039 Hypothyroidism, unspecified: Secondary | ICD-10-CM | POA: Insufficient documentation

## 2012-04-16 LAB — COMPREHENSIVE METABOLIC PANEL
ALT: 21 U/L (ref 0–35)
Calcium: 8.6 mg/dL (ref 8.4–10.5)
GFR calc Af Amer: 75 mL/min — ABNORMAL LOW (ref >90)
Glucose, Bld: 97 mg/dL (ref 70–99)
Sodium: 139 mEq/L (ref 135–145)
Total Protein: 7.1 g/dL (ref 6.0–8.3)

## 2012-04-16 LAB — POCT I-STAT, CHEM 8
BUN: 17 mg/dL (ref 6–23)
Calcium, Ion: 1.15 mmol/L (ref 1.12–1.23)
Creatinine, Ser: 1 mg/dL (ref 0.50–1.10)
Glucose, Bld: 97 mg/dL (ref 70–99)
TCO2: 28 mmol/L (ref 0–100)

## 2012-04-16 LAB — CBC WITH DIFFERENTIAL/PLATELET
Eosinophils Relative: 1 % (ref 0–5)
HCT: 42.7 % (ref 36.0–46.0)
Lymphocytes Relative: 15 % (ref 12–46)
Lymphs Abs: 0.6 10*3/uL — ABNORMAL LOW (ref 0.7–4.0)
MCV: 84.6 fL (ref 78.0–100.0)
Monocytes Absolute: 0.3 10*3/uL (ref 0.1–1.0)
RBC: 5.05 MIL/uL (ref 3.87–5.11)
RDW: 14.1 % (ref 11.5–15.5)
WBC: 3.9 10*3/uL — ABNORMAL LOW (ref 4.0–10.5)

## 2012-04-16 LAB — POCT URINALYSIS DIPSTICK
Glucose, UA: NEGATIVE
Nitrite, UA: NEGATIVE
Urobilinogen, UA: 1

## 2012-04-16 LAB — POCT UA - MICROSCOPIC ONLY: Crystals, Ur, HPF, POC: 0

## 2012-04-16 MED ORDER — ONDANSETRON HCL 4 MG/2ML IJ SOLN
4.0000 mg | Freq: Once | INTRAMUSCULAR | Status: AC
  Administered 2012-04-16: 4 mg via INTRAVENOUS
  Filled 2012-04-16: qty 2

## 2012-04-16 MED ORDER — ONDANSETRON HCL 8 MG PO TABS: 8.0000 mg | ORAL_TABLET | Freq: Three times a day (TID) | ORAL | Status: DC | PRN

## 2012-04-16 MED ORDER — HYDROCORTISONE SOD SUCCINATE 100 MG IJ SOLR
100.0000 mg | Freq: Once | INTRAMUSCULAR | Status: AC
  Administered 2012-04-16: 100 mg via INTRAVENOUS
  Filled 2012-04-16: qty 2

## 2012-04-16 MED ORDER — SODIUM CHLORIDE 0.9 % IV BOLUS (SEPSIS)
1000.0000 mL | Freq: Once | INTRAVENOUS | Status: AC
  Administered 2012-04-16: 1000 mL via INTRAVENOUS

## 2012-04-16 MED ORDER — PREDNISONE 10 MG PO TABS: ORAL_TABLET | ORAL | Status: DC

## 2012-04-16 MED ORDER — PROMETHAZINE HCL 25 MG PO TABS: 25.0000 mg | ORAL_TABLET | Freq: Three times a day (TID) | ORAL | Status: DC | PRN

## 2012-04-16 MED ORDER — SODIUM CHLORIDE 0.9 % IV SOLN
INTRAVENOUS | Status: DC
  Administered 2012-04-16: 14:00:00 via INTRAVENOUS

## 2012-04-16 NOTE — Patient Instructions (Addendum)
Since you feel so bad - let's have you go to [facility] for some fluids and eval by the ER physician - (I do believe you are dehydrated)  Lets increase prednisone to 20 mg daily and then taper down in a week  Make an appt with your rheumatologist when you do get home - so you can check in  I will culture your urine  Take phenergan as needed  I hope you feel better soon

## 2012-04-16 NOTE — Telephone Encounter (Signed)
Called pt to see how she was feeling post ER visit- after fluids and zofran she feels much better and is resting - she will check bp at home and make sure it is down to keep me posted

## 2012-04-16 NOTE — ED Notes (Signed)
States is having lupus flare has a h/a has been vomiting and diarrhea

## 2012-04-16 NOTE — Progress Notes (Signed)
Subjective:    Patient ID: [name], female    DOB: 1966/01/11, 47 y.o.   MRN: [id number]  HPI Here with symptoms of lupus flare  Since her last visit -has had more and more aches and joint pains  Also headache   Recent vomiting and diarrhea with gastroenteritis  No appetite  Last vomiting at 3 am - does not happen often  Diarrhea really stopped the past 2 days -- (no BM) Urine is darker- ? Dehydrated  Sips fluids but too nauseated to drink a lot yet  No abd pain  Just some pressure in that area  Feels like this is a lupus flare Her finger and wrist joints were swollen mon and tues  Aches in arms and neck and shoulders  On prednisone - 5 mg  Tried to get in with rheum - Dr Kellie Simmering - last visit 10/12   Hx of pleural effusions with her SLE  Not acutely sob now  Felt feverish on and off    Patient Active Problem List  Diagnosis  . HYPOTHYROIDISM, POSTABLATION  . HYPERCHOLESTEROLEMIA  . OBESITY  . ANXIETY  . PANIC DISORDER  . HYPERTENSION, ESSENTIAL NOS  . PULMONARY EMBOLISM  . PLEURAL EFFUSION  . GLOMERULONEPHRITIS  . SYSTEMIC LUPUS ERYTHEMATOSUS  . EDEMA  . ENLARGEMENT OF LYMPH NODES  . DVT, HX OF  . Hyperglycemia  . OSA on CPAP  . Neck pain  . Viral gastroenteritis  . Dark urine  . Urine abnormality  . Dehydration   Past Medical History  Diagnosis Date  . Nonspecific abnormal results of liver function study   . Personal history of venous thrombosis and embolism 2002    during pregnancy; ?factor 5 leiden (sees heme)  . Edema   . 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 Nephritis and nephropathy, not specified as acute or chronic, with unspecified pathological lesion in kidney   . Pure hypercholesterolemia   . Unspecified essential hypertension   . Other specified acquired hypothyroidism   . Obesity, unspecified   . Panic disorder without agoraphobia   . Unspecified pleural effusion   . Other  pulmonary embolism and infarction   . 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 Pleural effusion 2005    c/w lupus initial w/u; recurrent Right as pred tapered off July 2011  . HLD (hyperlipidemia)   . Iron deficiency anemia     IV dextran Coladonato  . 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 GERD (gastroesophageal reflux disease)     on prilosec r/t gastric sleeve surgery  . Arthritis     Lupus - hands/knees  . Anxiety state, unspecified     panic attacks  . Sleep apnea    Past Surgical History  Procedure Laterality Date  . Pleuryx catheter placement  9/11     VanTright----removed 9/11  . Thoracentesis  2010    with penumonia  . Pleurocentesis  10/2004  . Gastric sleeve  8/12    bariatric surgery Dr Adolphus Birchwood   . Wisdom tooth extraction    . Svd      x 3  . Laparoscopic tubal ligation  12/09/2010    Procedure: LAPAROSCOPIC TUBAL LIGATION;  Surgeon: Juluis Mire;  Location: [facility];  Service: Gynecology;  Laterality: Bilateral;  Attempted see Nursing note   History  Substance Use  Topics  . Smoking status: Former Smoker -- 0.50 packs/day for 10 years    Types: Cigarettes    Quit date: 05/04/2009  . Smokeless tobacco: Never Used     Comment: Socially x10 years (1 pack/month)  . Alcohol Use: Yes     Comment: socially   Family History  Problem Relation Age of Onset  . Lupus Sister   . Diabetes      GM   Allergies  Allergen Reactions  . Amoxicillin     REACTION: Pt. reports allergy, reaction not known  . Atorvastatin     REACTION: Reaction not known  . Moxifloxacin     REACTION: hallucinations   Current Outpatient Prescriptions on File Prior to Visit  Medication Sig Dispense Refill  . levothyroxine (SYNTHROID, LEVOTHROID) 175 MCG tablet Take 1 tablet (175 mcg total) by mouth daily.  90 tablet  3  . Multiple Vitamin (MULTIVITAMIN) tablet Take 1 tablet by mouth daily.        Marland Kitchen omeprazole (PRILOSEC) 20 MG capsule Take 20  mg by mouth daily.      Marland Kitchen PARoxetine (PAXIL) 40 MG tablet take 1 tablet by mouth every morning  30 tablet  5   No current facility-administered medications on file prior to visit.    Review of Systems Review of Systems  Constitutional: Negative for fever, appetite change,  and unexpected weight change.  ENT neg for ST or sinus cong Eyes: Negative for pain and visual disturbance.  Respiratory: Negative for cough and shortness of breath.   Cardiovascular: Negative for cp or palpitations    Gastrointestinal: Negative for abd pain, constipation , blood in stool or dark stool.  Genitourinary: Negative for urgency and frequency.  Skin: Negative for pallor or rash   MSK pos for joint pain and swelling  Neurological: Negative for weakness, light-headedness, numbness and headaches.  Hematological: Negative for adenopathy. Does not bruise/bleed easily.  Psychiatric/Behavioral: Negative for dysphoric mood. The patient is not nervous/anxious.         Objective:   Physical Exam  Constitutional: She appears well-developed and well-nourished. No distress.  HENT:  Head: Normocephalic and atraumatic.  Mouth/Throat: Oropharynx is clear and moist.  Eyes: Conjunctivae and EOM are normal. Pupils are equal, round, and reactive to light.  Neck: Normal range of motion. Neck supple. No JVD present. Carotid bruit is not present. No thyromegaly present.  Abdominal: Soft. Normal appearance. She exhibits no abdominal bruit and no ascites. Bowel sounds are increased. There is no hepatosplenomegaly. There is no rebound and no guarding.  Musculoskeletal: She exhibits tenderness. She exhibits no edema.  Hand and wrist joints are diffusely tender  Lymphadenopathy:    She has no cervical adenopathy.  Neurological: She is alert. She has normal reflexes. No cranial nerve deficit. She exhibits normal muscle tone. Coordination normal.  Skin: Skin is warm and dry. No rash noted. No erythema. No pallor.  Nl turgor and  brisk capillary refil  Psychiatric: She has a normal mood and affect.          Assessment & Plan:

## 2012-04-16 NOTE — Assessment & Plan Note (Signed)
Symptom flare s/p viral gastroenteritis Inc pred to 20 mg for 1 week and then taper back to 5  She will f/u with her rheumatologist and also renal

## 2012-04-16 NOTE — ED Notes (Signed)
States had the norovirus last week

## 2012-04-16 NOTE — Assessment & Plan Note (Signed)
Recent gastroenteritis-improved Pt cannot get in enough fluid - due to hx of bariatric surg  Has headache and malaise Feels she needs IV fluids at this point  Needs to be done with caution in light of hx of pleural eff from her SLE and propensity to get fluid overloaded She chose to go to [facility] ER for further eval for fluids

## 2012-04-16 NOTE — ED Provider Notes (Signed)
History     CSN: [id number]  Arrival date & time 04/16/12  0944   First MD Initiated Contact with Patient 04/16/12 1039      Chief Complaint  Patient presents with  . Emesis  . Dehydration  . Headache    (Consider location/radiation/quality/duration/timing/severity/associated sxs/prior treatment) HPI Comments: [name] is a 47 y.o. female who states that she has had vomiting, and nausea, since yesterday. She was ill about a week and a half ago with vomiting and diarrhea. The illness lasted about 4 days. Then, she was well until 2 days ago. They're no family members at home, who are sick. She works with "a lot of people." She did not take her prednisone yesterday or today. She is on prednisone chronically for lupus. She saw her PCP today, and was sent here for IV fluids and Phenergan, by her report. Her doctor also recommended that she take "an increased dose of prednisone for a lupus flare" . She states that she has a headache today. She does not typically get headaches when she has a lupus flare. She denies fever, chills, cough, shortness of breath, weakness, dizziness. There's been no diarrhea, since last week. She had an episode of chest pressure that lasted several hours, yesterday. There is no chest discomfort, today. There are no other modifying factors.  Patient is a 47 y.o. female presenting with vomiting and headaches. The history is provided by the patient.  Emesis Associated symptoms: headaches   Headache Associated symptoms: vomiting     Past Medical History  Diagnosis Date  . Nonspecific abnormal results of liver function study   . Personal history of venous thrombosis and embolism 2002    during pregnancy; ?factor 5 leiden (sees heme)  . Edema   . 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 Nephritis and nephropathy, not specified as acute or chronic, with unspecified pathological lesion in kidney   . Pure  hypercholesterolemia   . Unspecified essential hypertension   . Other specified acquired hypothyroidism   . Obesity, unspecified   . Panic disorder without agoraphobia   . Unspecified pleural effusion   . Other pulmonary embolism and infarction   . 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 Pleural effusion 2005    c/w lupus initial w/u; recurrent Right as pred tapered off July 2011  . HLD (hyperlipidemia)   . Iron deficiency anemia     IV dextran Coladonato  . 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 GERD (gastroesophageal reflux disease)     on prilosec r/t gastric sleeve surgery  . Arthritis     Lupus - hands/knees  . Anxiety state, unspecified     panic attacks  . Sleep apnea     Past Surgical History  Procedure Laterality Date  . Pleuryx catheter placement  9/11     VanTright----removed 9/11  . Thoracentesis  2010    with penumonia  . Pleurocentesis  10/2004  . Gastric sleeve  8/12    bariatric surgery Dr Adolphus Birchwood   . Wisdom tooth extraction    . Svd      x 3  . Laparoscopic tubal ligation  12/09/2010    Procedure: LAPAROSCOPIC TUBAL LIGATION;  Surgeon: Juluis Mire;  Location: [facility];  Service: Gynecology;  Laterality: Bilateral;  Attempted see Nursing note    Family History  Problem Relation Age of Onset  . Lupus Sister   . Diabetes      GM    History  Substance Use Topics  . Smoking status: Former Smoker -- 0.50 packs/day for 10 years    Types: Cigarettes    Quit date: 05/04/2009  . Smokeless tobacco: Never Used     Comment: Socially x10 years (1 pack/month)  . Alcohol Use: Yes     Comment: socially    OB History   Grav Para Term Preterm Abortions TAB SAB Ect Mult Living                  Review of Systems  Gastrointestinal: Positive for vomiting.  Neurological: Positive for headaches.  All other systems reviewed and are negative.    Allergies  Amoxicillin; Atorvastatin; and Moxifloxacin  Home  Medications   Current Outpatient Rx  Name  Route  Sig  Dispense  Refill  . levothyroxine (SYNTHROID, LEVOTHROID) 175 MCG tablet   Oral   Take 1 tablet (175 mcg total) by mouth daily.   90 tablet   3   . Multiple Vitamin (MULTIVITAMIN) tablet   Oral   Take 1 tablet by mouth daily.           Marland Kitchen omeprazole (PRILOSEC) 20 MG capsule   Oral   Take 20 mg by mouth daily.         Marland Kitchen PARoxetine (PAXIL) 40 MG tablet      take 1 tablet by mouth every morning   30 tablet   5   . predniSONE (DELTASONE) 10 MG tablet   Oral   Take 5 mg by mouth daily.         . promethazine (PHENERGAN) 25 MG tablet   Oral   Take 1 tablet (25 mg total) by mouth every 8 (eight) hours as needed for nausea.   20 tablet   0   . warfarin (COUMADIN) 1 MG tablet   Oral   Take 3 mg by mouth as directed. Take with 10mg  for total of 13mg  on Tuesday and Thursday         . warfarin (COUMADIN) 10 MG tablet   Oral   Take 10 mg by mouth daily. Take with #3- 1mg  tablets to = 13mg  on Tuesday and Thursday         . warfarin (COUMADIN) 7.5 MG tablet   Oral   Take 11.5 mg by mouth daily. Take 1.5 tablet (=11.5mg ) on All other days (sunday, Monday, Wednesday, Friday, Saturday)         . ondansetron (ZOFRAN) 8 MG tablet   Oral   Take 1 tablet (8 mg total) by mouth every 8 (eight) hours as needed for nausea.   20 tablet   0     BP 173/107  Pulse 100  Temp(Src) 97.7 F (36.5 C) (Oral)  Resp 20  Ht 5\' 5"  (1.651 m)  Wt 286 lb (129.729 kg)  BMI 47.59 kg/m2  SpO2 96%  Physical Exam  Nursing note and vitals reviewed. Constitutional: She is oriented to person, place, and time. She appears well-developed. No distress.  Obese  HENT:  Head: Normocephalic and atraumatic.  Eyes: Conjunctivae and EOM are normal. Pupils are equal, round, and reactive to light.  Neck: Normal range of motion and phonation normal. Neck supple.  Cardiovascular: Normal rate, regular rhythm and intact distal pulses.    Pulmonary/Chest: Effort normal and breath sounds normal. She exhibits no tenderness.  Abdominal: Soft. She  exhibits no distension. There is no tenderness. There is no guarding.  Musculoskeletal: Normal range of motion. She exhibits no edema and no tenderness.  Neurological: She is alert and oriented to person, place, and time. She has normal strength. She exhibits normal muscle tone.  Skin: Skin is warm and dry.  Psychiatric: She has a normal mood and affect. Her behavior is normal. Judgment and thought content normal.    ED Course  Procedures (including critical care time)  Emergency Department treatment: IV fluids, IV Zofran  IV hydrocortisone given for possible adrenal insufficiency, causing vomiting, secondary to not taking prednisone. Note that her blood pressure, is actually high today.   Reeval. At D/C, tolerating oral food and fluid. Trended BP, remains with mild HTN. She is not currently on BP medicine  Labs Reviewed  COMPREHENSIVE METABOLIC PANEL - Abnormal; Notable for the following:    Albumin 3.0 (*)    Alkaline Phosphatase 129 (*)    GFR calc non Af Amer 65 (*)    GFR calc Af Amer 75 (*)    All other components within normal limits  CBC WITH DIFFERENTIAL - Abnormal; Notable for the following:    WBC 3.9 (*)    Platelets 108 (*)    Lymphs Abs 0.6 (*)    All other components within normal limits  POCT I-STAT, CHEM 8   No results found. Nursing Notes Reviewed/ Care Coordinated Applicable Imaging Reviewed Interpretation of Laboratory Data incorporated into ED treatment   1. Nausea and vomiting   2. Hypertension       MDM  Nonspecific N/V and HTN, doubt hypertensive urgency, metabolic instability or bacterial infection. Doubt metabolic instability, serious bacterial infection or impending vascular collapse; the patient is stable for discharge.  Plan: Home Medications- usual plus Zofran; Home Treatments- gradually advance diet; Recommended follow up- PCP for  checkup in 4-5 days      Flint Melter, MD 04/16/12 2142

## 2012-04-16 NOTE — Assessment & Plan Note (Signed)
Due to dehydration Pt also has nephropathy from SLE Some blood and protein Also sent for cx

## 2012-04-16 NOTE — ED Notes (Signed)
Pt reports recently having norovirus last week, having bodyaches and headache, thinks its related to her lupus and was sent here by pcp for iv fluids due to dehydration. No acute distress noted at this time.

## 2012-04-18 LAB — URINE CULTURE: Colony Count: 75000

## 2012-04-21 ENCOUNTER — Ambulatory Visit: Payer: Managed Care, Other (non HMO) | Admitting: Family Medicine

## 2012-04-21 ENCOUNTER — Encounter (HOSPITAL_COMMUNITY): Payer: Self-pay | Admitting: Emergency Medicine

## 2012-04-21 ENCOUNTER — Inpatient Hospital Stay (HOSPITAL_COMMUNITY)
Admission: EM | Admit: 2012-04-21 | Discharge: 2012-04-27 | DRG: 065 | Disposition: A | Discharge status: Home / Self Care | Payer: Managed Care, Other (non HMO) | Attending: Internal Medicine | Admitting: Internal Medicine

## 2012-04-21 DIAGNOSIS — E038 Other specified hypothyroidism: Secondary | ICD-10-CM

## 2012-04-21 DIAGNOSIS — D6851 Activated protein C resistance: Secondary | ICD-10-CM

## 2012-04-21 DIAGNOSIS — D682 Hereditary deficiency of other clotting factors: Secondary | ICD-10-CM | POA: Diagnosis present

## 2012-04-21 DIAGNOSIS — Z79899 Other long term (current) drug therapy: Secondary | ICD-10-CM

## 2012-04-21 DIAGNOSIS — E860 Dehydration: Secondary | ICD-10-CM

## 2012-04-21 DIAGNOSIS — M329 Systemic lupus erythematosus, unspecified: Secondary | ICD-10-CM

## 2012-04-21 DIAGNOSIS — M542 Cervicalgia: Secondary | ICD-10-CM

## 2012-04-21 DIAGNOSIS — J90 Pleural effusion, not elsewhere classified: Secondary | ICD-10-CM

## 2012-04-21 DIAGNOSIS — I619 Nontraumatic intracerebral hemorrhage, unspecified: Secondary | ICD-10-CM

## 2012-04-21 DIAGNOSIS — F411 Generalized anxiety disorder: Secondary | ICD-10-CM

## 2012-04-21 DIAGNOSIS — Z86711 Personal history of pulmonary embolism: Secondary | ICD-10-CM | POA: Diagnosis present

## 2012-04-21 DIAGNOSIS — Z86718 Personal history of other venous thrombosis and embolism: Secondary | ICD-10-CM

## 2012-04-21 DIAGNOSIS — E785 Hyperlipidemia, unspecified: Secondary | ICD-10-CM | POA: Diagnosis present

## 2012-04-21 DIAGNOSIS — R8290 Unspecified abnormal findings in urine: Secondary | ICD-10-CM

## 2012-04-21 DIAGNOSIS — I160 Hypertensive urgency: Secondary | ICD-10-CM

## 2012-04-21 DIAGNOSIS — N059 Unspecified nephritic syndrome with unspecified morphologic changes: Secondary | ICD-10-CM

## 2012-04-21 DIAGNOSIS — R197 Diarrhea, unspecified: Secondary | ICD-10-CM | POA: Diagnosis present

## 2012-04-21 DIAGNOSIS — R609 Edema, unspecified: Secondary | ICD-10-CM

## 2012-04-21 DIAGNOSIS — R82998 Other abnormal findings in urine: Secondary | ICD-10-CM

## 2012-04-21 DIAGNOSIS — N390 Urinary tract infection, site not specified: Secondary | ICD-10-CM

## 2012-04-21 DIAGNOSIS — G4733 Obstructive sleep apnea (adult) (pediatric): Secondary | ICD-10-CM

## 2012-04-21 DIAGNOSIS — R112 Nausea with vomiting, unspecified: Secondary | ICD-10-CM | POA: Diagnosis present

## 2012-04-21 DIAGNOSIS — E039 Hypothyroidism, unspecified: Secondary | ICD-10-CM | POA: Diagnosis present

## 2012-04-21 DIAGNOSIS — N179 Acute kidney failure, unspecified: Secondary | ICD-10-CM

## 2012-04-21 DIAGNOSIS — Z7901 Long term (current) use of anticoagulants: Secondary | ICD-10-CM

## 2012-04-21 DIAGNOSIS — A084 Viral intestinal infection, unspecified: Secondary | ICD-10-CM

## 2012-04-21 DIAGNOSIS — IMO0002 Reserved for concepts with insufficient information to code with codable children: Secondary | ICD-10-CM

## 2012-04-21 DIAGNOSIS — Z6841 Body Mass Index (BMI) 40.0 and over, adult: Secondary | ICD-10-CM

## 2012-04-21 DIAGNOSIS — Z9884 Bariatric surgery status: Secondary | ICD-10-CM

## 2012-04-21 DIAGNOSIS — F410 Panic disorder [episodic paroxysmal anxiety] without agoraphobia: Secondary | ICD-10-CM

## 2012-04-21 DIAGNOSIS — E7800 Pure hypercholesterolemia, unspecified: Secondary | ICD-10-CM

## 2012-04-21 DIAGNOSIS — E669 Obesity, unspecified: Secondary | ICD-10-CM

## 2012-04-21 DIAGNOSIS — I10 Essential (primary) hypertension: Secondary | ICD-10-CM

## 2012-04-21 DIAGNOSIS — Z0289 Encounter for other administrative examinations: Secondary | ICD-10-CM

## 2012-04-21 DIAGNOSIS — R739 Hyperglycemia, unspecified: Secondary | ICD-10-CM

## 2012-04-21 DIAGNOSIS — K219 Gastro-esophageal reflux disease without esophagitis: Secondary | ICD-10-CM | POA: Diagnosis present

## 2012-04-21 DIAGNOSIS — I629 Nontraumatic intracranial hemorrhage, unspecified: Principal | ICD-10-CM

## 2012-04-21 DIAGNOSIS — I2699 Other pulmonary embolism without acute cor pulmonale: Secondary | ICD-10-CM

## 2012-04-21 DIAGNOSIS — R599 Enlarged lymph nodes, unspecified: Secondary | ICD-10-CM

## 2012-04-21 LAB — POCT PREGNANCY, URINE: Preg Test, Ur: NEGATIVE

## 2012-04-21 LAB — CBC WITH DIFFERENTIAL/PLATELET
Basophils Absolute: 0 10*3/uL (ref 0.0–0.1)
Basophils Relative: 0 % (ref 0–1)
Eosinophils Absolute: 0 10*3/uL (ref 0.0–0.7)
Eosinophils Relative: 1 % (ref 0–5)
HCT: 43.7 % (ref 36.0–46.0)
Hemoglobin: 14.7 g/dL (ref 12.0–15.0)
Lymphocytes Relative: 10 % — ABNORMAL LOW (ref 12–46)
Lymphs Abs: 0.4 10*3/uL — ABNORMAL LOW (ref 0.7–4.0)
MCH: 28.2 pg (ref 26.0–34.0)
MCHC: 33.6 g/dL (ref 30.0–36.0)
MCV: 83.7 fL (ref 78.0–100.0)
Monocytes Absolute: 0.3 10*3/uL (ref 0.1–1.0)
Monocytes Relative: 7 % (ref 3–12)
Neutro Abs: 3.3 10*3/uL (ref 1.7–7.7)
Neutrophils Relative %: 82 % — ABNORMAL HIGH (ref 43–77)
Platelets: 96 10*3/uL — ABNORMAL LOW (ref 150–400)
RBC: 5.22 MIL/uL — ABNORMAL HIGH (ref 3.87–5.11)
RDW: 14.2 % (ref 11.5–15.5)
WBC: 4.1 10*3/uL (ref 4.0–10.5)

## 2012-04-21 LAB — URINALYSIS, MICROSCOPIC ONLY
Glucose, UA: NEGATIVE mg/dL
Ketones, ur: 15 mg/dL — AB
Nitrite: POSITIVE — AB
Protein, ur: >300 mg/dL — AB
Specific Gravity, Urine: 1.041 — ABNORMAL HIGH (ref 1.005–1.030)
Urobilinogen, UA: 1 mg/dL (ref 0.0–1.0)
pH: 6 (ref 5.0–8.0)

## 2012-04-21 LAB — COMPREHENSIVE METABOLIC PANEL
ALT: 33 U/L (ref 0–35)
AST: 30 U/L (ref 0–37)
Albumin: 2.8 g/dL — ABNORMAL LOW (ref 3.5–5.2)
Alkaline Phosphatase: 136 U/L — ABNORMAL HIGH (ref 39–117)
BUN: 18 mg/dL (ref 6–23)
CO2: 26 mEq/L (ref 19–32)
Calcium: 8.4 mg/dL (ref 8.4–10.5)
Chloride: 103 mEq/L (ref 96–112)
Creatinine, Ser: 1.21 mg/dL — ABNORMAL HIGH (ref 0.50–1.10)
GFR calc Af Amer: 62 mL/min — ABNORMAL LOW (ref >90)
GFR calc non Af Amer: 53 mL/min — ABNORMAL LOW (ref >90)
Glucose, Bld: 117 mg/dL — ABNORMAL HIGH (ref 70–99)
Potassium: 4.2 mEq/L (ref 3.5–5.1)
Sodium: 138 mEq/L (ref 135–145)
Total Bilirubin: 0.4 mg/dL (ref 0.3–1.2)
Total Protein: 6.7 g/dL (ref 6.0–8.3)

## 2012-04-21 LAB — LIPASE, BLOOD: Lipase: 23 U/L (ref 11–59)

## 2012-04-21 MED ORDER — SODIUM CHLORIDE 0.9 % IV BOLUS (SEPSIS)
1000.0000 mL | Freq: Once | INTRAVENOUS | Status: AC
  Administered 2012-04-21: 1000 mL via INTRAVENOUS

## 2012-04-21 MED ORDER — METOPROLOL TARTRATE 1 MG/ML IV SOLN
10.0000 mg | Freq: Once | INTRAVENOUS | Status: AC
  Administered 2012-04-21: 10 mg via INTRAVENOUS
  Filled 2012-04-21: qty 10

## 2012-04-21 MED ORDER — MORPHINE SULFATE 4 MG/ML IJ SOLN
4.0000 mg | Freq: Once | INTRAMUSCULAR | Status: AC
  Administered 2012-04-21: 4 mg via INTRAVENOUS
  Filled 2012-04-21: qty 1

## 2012-04-21 MED ORDER — DEXTROSE 5 % IV SOLN
1.0000 g | Freq: Once | INTRAVENOUS | Status: AC
  Administered 2012-04-21: 1 g via INTRAVENOUS
  Filled 2012-04-21: qty 10

## 2012-04-21 MED ORDER — ONDANSETRON HCL 4 MG/2ML IJ SOLN
4.0000 mg | Freq: Once | INTRAMUSCULAR | Status: AC
  Administered 2012-04-21: 4 mg via INTRAVENOUS
  Filled 2012-04-21: qty 2

## 2012-04-21 MED ORDER — OXYCODONE-ACETAMINOPHEN 5-325 MG PO TABS
2.0000 | ORAL_TABLET | Freq: Once | ORAL | Status: AC
  Administered 2012-04-21: 2 via ORAL
  Filled 2012-04-21: qty 2

## 2012-04-21 NOTE — ED Notes (Signed)
Pt reports she had norovirus 2 weeks ago that seemed to have gotten better for a few days.  1 week ago she started to have nausea, vomiting, diarrhea, dark urine, and generalized body aches again.

## 2012-04-21 NOTE — ED Provider Notes (Signed)
History     CSN: [id number]  Arrival date & time 04/21/12  1448   First MD Initiated Contact with Patient 04/21/12 1806      Chief Complaint  Patient presents with  . Emesis  . Generalized Body Aches    (Consider location/radiation/quality/duration/timing/severity/associated sxs/prior treatment) Patient is a 47 y.o. female presenting with vomiting. The history is provided by the patient and medical records. No language interpreter was used.  Emesis Associated symptoms: arthralgias, diarrhea, headaches and myalgias   Associated symptoms: no abdominal pain    [name] is a 47 y.o. 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 Pt with Norovirus 2 weeks ago and was seen on Friday for IV rehydration and was dx with Lupus flare and given steroids.  Pt developed headache 3 days ago which has been persistent and associated with a gradually stiffening neck.  Associated symptoms include nausea, vomiting, diarrhea, headache, stiff neck, fever, chills, dizziness.  Nothing makes it better and nothing makes it worse.  Pt denies chest pain, SOB, abdominal pain, lightheadedness, syncope, dysuria, hematuria.    Rheumatologist: Trusselou    Past Medical History  Diagnosis Date  . Nonspecific abnormal results of liver function study   . Personal history of venous thrombosis and embolism 2002    during pregnancy; ?factor 5 leiden (sees heme)  . Edema   . 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 Nephritis and nephropathy, not specified as acute or chronic, with unspecified pathological lesion in kidney   . Pure hypercholesterolemia   . Unspecified essential hypertension   . Other specified acquired hypothyroidism   . Obesity, unspecified   . Panic disorder without agoraphobia   .  Unspecified pleural effusion   . Other pulmonary embolism and infarction   . 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 Pleural effusion 2005    c/w lupus initial w/u; recurrent Right as pred tapered off July 2011  . HLD (hyperlipidemia)   . Iron deficiency anemia     IV dextran Col[place]onato  . 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 GERD (gastroesophageal reflux disease)     on prilosec r/t gastric sleeve surgery  . Arthritis     Lupus - hands/knees  . Anxiety state, unspecified     panic attacks  . Sleep apnea     Past Surgical History  Procedure Laterality Date  . Pleuryx catheter placement  9/11     VanTright----removed 9/11  . Thoracentesis  2010    with penumonia  . Pleurocentesis  10/2004  . Gastric sleeve  8/12    bariatric surgery Dr Adolphus Birchwood   . Wisdom tooth extraction    . Svd      x 3  . Laparoscopic tubal ligation  12/09/2010    Procedure: LAPAROSCOPIC TUBAL LIGATION;  Surgeon: Juluis Mire;  Location: [facility];  Service: Gynecology;  Laterality: Bilateral;  Attempted see Nursing note    Family History  Problem Relation Age of Onset  . Lupus Sister   . Diabetes      GM    History  Substance Use Topics  . Smoking status: Former Smoker -- 0.50 packs/day for 10 years    Types: Cigarettes  Quit date: 05/04/2009  . Smokeless tobacco: Never Used     Comment: Socially x10 years (1 pack/month)  . Alcohol Use: Yes     Comment: socially    OB History   Grav Para Term Preterm Abortions TAB SAB Ect Mult Living                  Review of Systems  Constitutional: Positive for fever and activity change. Negative for diaphoresis, appetite change, fatigue and unexpected weight change.  HENT: Positive for neck pain and neck stiffness. Negative for mouth sores.   Eyes: Negative for visual disturbance.  Respiratory: Negative for cough, chest tightness, shortness of breath and wheezing.   Cardiovascular: Negative for  chest pain.  Gastrointestinal: Positive for nausea, vomiting and diarrhea. Negative for abdominal pain and constipation.  Endocrine: Negative for polydipsia, polyphagia and polyuria.  Genitourinary: Negative for dysuria, urgency, frequency and hematuria.  Musculoskeletal: Positive for myalgias and arthralgias. Negative for back pain.  Skin: Negative for rash.  Allergic/Immunologic: Negative for immunocompromised state.  Neurological: Positive for dizziness and headaches. Negative for syncope and light-headedness.  Hematological: Does not bruise/bleed easily.  Psychiatric/Behavioral: Negative for sleep disturbance. The patient is not nervous/anxious.     Allergies  Amoxicillin; Atorvastatin; and Moxifloxacin  Home Medications   Current Outpatient Rx  Name  Route  Sig  Dispense  Refill  . levothyroxine (SYNTHROID, LEVOTHROID) 175 MCG tablet   Oral   Take 1 tablet (175 mcg total) by mouth daily.   90 tablet   3   . Multiple Vitamin (MULTIVITAMIN) tablet   Oral   Take 1 tablet by mouth daily.           Marland Kitchen omeprazole (PRILOSEC) 20 MG capsule   Oral   Take 20 mg by mouth daily.         Marland Kitchen PARoxetine (PAXIL) 40 MG tablet      take 1 tablet by mouth every morning   30 tablet   5   . predniSONE (DELTASONE) 10 MG tablet   Oral   Take 20 mg by mouth daily.          . promethazine (PHENERGAN) 25 MG tablet   Oral   Take 1 tablet (25 mg total) by mouth every 8 (eight) hours as needed for nausea.   20 tablet   0   . warfarin (COUMADIN) 1 MG tablet   Oral   Take 3 mg by mouth See admin instructions. Pt takes #3 1 mg tabs along with 10 mg tab to =13 mg of warfarin on tue and thu         . warfarin (COUMADIN) 10 MG tablet   Oral   Take 10 mg by mouth See admin instructions. Take with #3- 1mg  tablets to = 13mg  on Tuesday and Thursday         . warfarin (COUMADIN) 7.5 MG tablet   Oral   Take 11.5 mg by mouth See admin instructions. Take 1.5 tablet (=11.5mg ) on All other  days (sunday, Monday, Wednesday, Friday, Saturday)           BP 209/118  Pulse 59  Temp(Src) 97.8 F (36.6 C) (Oral)  Resp 12  SpO2 94%  Physical Exam  Nursing note and vitals reviewed. Constitutional: She is oriented to person, place, and time. She appears well-developed and well-nourished. No distress.  HENT:  Head: Normocephalic and atraumatic.  Right Ear: Tympanic membrane, external ear and ear canal normal.  Left Ear: Tympanic  membrane, external ear and ear canal normal.  Nose: Nose normal.  Mouth/Throat: Uvula is midline, oropharynx is clear and moist and mucous membranes are normal. Mucous membranes are not dry. No edematous. No oropharyngeal exudate, posterior oropharyngeal edema, posterior oropharyngeal erythema or tonsillar abscesses.  Eyes: Conjunctivae and EOM are normal. Pupils are equal, round, and reactive to light. No scleral icterus.  Neck: Normal range of motion. Neck supple. Muscular tenderness present. No rigidity. Normal range of motion present. No Brudzinski's sign and no Kernig's sign noted.    No midline tenderness No nuchal rigidity or decreased ROM Pt pain is on the sides of her neck when performing ROM exercises   Cardiovascular: Normal rate, regular rhythm, normal heart sounds and intact distal pulses.  Exam reveals no gallop and no friction rub.   No murmur heard. Pulmonary/Chest: Effort normal and breath sounds normal. No respiratory distress. She has no wheezes. She has no rales. She exhibits no tenderness.  Abdominal: Soft. Bowel sounds are normal. She exhibits no distension and no mass. There is no tenderness. There is no rebound and no guarding.  Musculoskeletal: Normal range of motion. She exhibits no edema.  Lymphadenopathy:    She has no cervical adenopathy.  Neurological: She is alert and oriented to person, place, and time. She exhibits normal muscle tone. Coordination normal.  Speech is clear and goal oriented, follows commands Major  Cranial nerves without deficit, no facial droop Normal strength in upper and lower extremities bilaterally including dorsiflexion and plantar flexion, strong and equal grip strength Sensation normal to light and sharp touch Moves extremities without ataxia, coordination intact Normal finger to nose and rapid alternating movements Neg romberg, no pronator drift Normal gait and balance  Skin: Skin is warm and dry. No rash noted. She is not diaphoretic. No erythema.  Psychiatric: She has a normal mood and affect. Her behavior is normal.    ED Course  Procedures (including critical care time)  Labs Reviewed  COMPREHENSIVE METABOLIC PANEL - Abnormal; Notable for the following:    Glucose, Bld 117 (*)    Creatinine, Ser 1.21 (*)    Albumin 2.8 (*)    Alkaline Phosphatase 136 (*)    GFR calc non Af Amer 53 (*)    GFR calc Af Amer 62 (*)    All other components within normal limits  CBC WITH DIFFERENTIAL - Abnormal; Notable for the following:    RBC 5.22 (*)    Platelets 96 (*)    Neutrophils Relative 82 (*)    Lymphocytes Relative 10 (*)    Lymphs Abs 0.4 (*)    All other components within normal limits  URINALYSIS, MICROSCOPIC ONLY - Abnormal; Notable for the following:    Color, Urine RED (*)    APPearance TURBID (*)    Specific Gravity, Urine 1.041 (*)    Hgb urine dipstick LARGE (*)    Bilirubin Urine LARGE (*)    Ketones, ur 15 (*)    Protein, ur >300 (*)    Nitrite POSITIVE (*)    Leukocytes, UA MODERATE (*)    Bacteria, UA MANY (*)    Squamous Epithelial / LPF MANY (*)    Casts HYALINE CASTS (*)    All other components within normal limits  PROTIME-INR - Abnormal; Notable for the following:    Prothrombin Time 39.1 (*)    INR 4.38 (*)    All other components within normal limits  URINE CULTURE  LIPASE, BLOOD  POCT PREGNANCY, URINE  No results found.   1. Hypertensive urgency   2. UTI (lower urinary tract infection)   3. Dark urine   4. Dehydration        MDM  [name] presents with headache, fever,myalgias and Hx of Lupus.  UA with evidence of urinary tract infection, patient given Rocephin IV here in the department. Patient also dehydrated given 2 L of fluid. Patient significantly hypertensive 200s over 130s in the department, in spite of Lopressor IV.  Patient with increased PT/INR and a significant concern for hypertensive urgency. Pt is without neurologic deficit on exam, but appears uncomfortable and is continuing to c/o neck pain.  Patient is afebrile and does not have nuchal rigidity there for a month concern for meningitis.  Patient evaluated by Dr. Nedra Hai who will admit for hypertensive urgency but also recommends head CT.  Dr. Juleen China was consulted, evaluated this patient with me and agrees with the plan.    Head CT with intraventricular hemorrhage involving the third and fourth ventricles as well as possible parenchymal hemorrhage in the corpus callosum just above the third ventricle. No midline shift or evidence of hydrocephalus.  I personally reviewed the imaging tests through PACS system.  I reviewed available ER/hospitalization records through the EMR.          Cheryl Client Tamel Abel, PA-C 04/22/12 [phone]

## 2012-04-21 NOTE — ED Notes (Signed)
N/V started a week ago (3/12). Pt reports having a prescription of phenergan and prednisone, but missed a dose last night. Delayed dose could not be held down due to N/V.

## 2012-04-22 ENCOUNTER — Inpatient Hospital Stay (HOSPITAL_COMMUNITY): Payer: Managed Care, Other (non HMO)

## 2012-04-22 ENCOUNTER — Encounter (HOSPITAL_COMMUNITY): Payer: Self-pay | Admitting: Internal Medicine

## 2012-04-22 ENCOUNTER — Telehealth: Payer: Self-pay

## 2012-04-22 DIAGNOSIS — I10 Essential (primary) hypertension: Secondary | ICD-10-CM

## 2012-04-22 DIAGNOSIS — F411 Generalized anxiety disorder: Secondary | ICD-10-CM

## 2012-04-22 DIAGNOSIS — E038 Other specified hypothyroidism: Secondary | ICD-10-CM

## 2012-04-22 DIAGNOSIS — Z9884 Bariatric surgery status: Secondary | ICD-10-CM

## 2012-04-22 DIAGNOSIS — N059 Unspecified nephritic syndrome with unspecified morphologic changes: Secondary | ICD-10-CM

## 2012-04-22 DIAGNOSIS — G4733 Obstructive sleep apnea (adult) (pediatric): Secondary | ICD-10-CM

## 2012-04-22 DIAGNOSIS — M329 Systemic lupus erythematosus, unspecified: Secondary | ICD-10-CM

## 2012-04-22 DIAGNOSIS — D6851 Activated protein C resistance: Secondary | ICD-10-CM | POA: Diagnosis present

## 2012-04-22 DIAGNOSIS — I160 Hypertensive urgency: Secondary | ICD-10-CM | POA: Diagnosis present

## 2012-04-22 DIAGNOSIS — N390 Urinary tract infection, site not specified: Secondary | ICD-10-CM

## 2012-04-22 DIAGNOSIS — Z7901 Long term (current) use of anticoagulants: Secondary | ICD-10-CM

## 2012-04-22 DIAGNOSIS — I619 Nontraumatic intracerebral hemorrhage, unspecified: Secondary | ICD-10-CM | POA: Diagnosis present

## 2012-04-22 LAB — PROTIME-INR: INR: 1.44 (ref 0.00–1.49)

## 2012-04-22 LAB — BASIC METABOLIC PANEL
BUN: 23 mg/dL (ref 6–23)
CO2: 24 mEq/L (ref 19–32)
Chloride: 104 mEq/L (ref 96–112)
GFR calc non Af Amer: 47 mL/min — ABNORMAL LOW (ref >90)
Glucose, Bld: 121 mg/dL — ABNORMAL HIGH (ref 70–99)
Potassium: 4.4 mEq/L (ref 3.5–5.1)
Sodium: 137 mEq/L (ref 135–145)

## 2012-04-22 LAB — TSH: TSH: 2.691 u[IU]/mL (ref 0.350–4.500)

## 2012-04-22 LAB — MRSA PCR SCREENING: MRSA by PCR: NEGATIVE

## 2012-04-22 MED ORDER — ANTIINHIBITOR COAGULANT CMPLX IV SOLR: 500.0000 [IU] | Freq: Once | INTRAVENOUS | Status: DC | PRN

## 2012-04-22 MED ORDER — DOCUSATE SODIUM 100 MG PO CAPS
100.0000 mg | ORAL_CAPSULE | Freq: Two times a day (BID) | ORAL | Status: DC
  Administered 2012-04-22 – 2012-04-27 (×9): 100 mg via ORAL
  Filled 2012-04-22 (×11): qty 1

## 2012-04-22 MED ORDER — ONDANSETRON HCL 4 MG/2ML IJ SOLN
4.0000 mg | Freq: Four times a day (QID) | INTRAMUSCULAR | Status: DC | PRN
  Administered 2012-04-22 – 2012-04-23 (×6): 4 mg via INTRAVENOUS
  Filled 2012-04-22 (×6): qty 2

## 2012-04-22 MED ORDER — LEVOTHYROXINE SODIUM 175 MCG PO TABS
175.0000 ug | ORAL_TABLET | Freq: Every day | ORAL | Status: DC
  Administered 2012-04-22 – 2012-04-27 (×6): 175 ug via ORAL
  Filled 2012-04-22 (×7): qty 1

## 2012-04-22 MED ORDER — SODIUM CHLORIDE 0.9 % IJ SOLN
3.0000 mL | Freq: Two times a day (BID) | INTRAMUSCULAR | Status: DC
  Administered 2012-04-22 – 2012-04-24 (×5): 3 mL via INTRAVENOUS

## 2012-04-22 MED ORDER — ONDANSETRON HCL 4 MG/2ML IJ SOLN
INTRAMUSCULAR | Status: AC
  Administered 2012-04-22: 03:00:00
  Filled 2012-04-22: qty 2

## 2012-04-22 MED ORDER — NICARDIPINE HCL IN NACL 20-0.86 MG/200ML-% IV SOLN
5.0000 mg/h | INTRAVENOUS | Status: DC
  Administered 2012-04-22 (×2): 15 mg/h via INTRAVENOUS
  Filled 2012-04-22 (×2): qty 200

## 2012-04-22 MED ORDER — PAROXETINE HCL 20 MG PO TABS
40.0000 mg | ORAL_TABLET | Freq: Every day | ORAL | Status: DC
  Administered 2012-04-22 – 2012-04-27 (×6): 40 mg via ORAL
  Filled 2012-04-22 (×6): qty 2

## 2012-04-22 MED ORDER — MORPHINE SULFATE 4 MG/ML IJ SOLN
6.0000 mg | Freq: Once | INTRAMUSCULAR | Status: AC
  Administered 2012-04-22: 6 mg via INTRAVENOUS
  Filled 2012-04-22: qty 2

## 2012-04-22 MED ORDER — GADOBENATE DIMEGLUMINE 529 MG/ML IV SOLN
20.0000 mL | Freq: Once | INTRAVENOUS | Status: AC
  Administered 2012-04-22: 14 mL via INTRAVENOUS

## 2012-04-22 MED ORDER — ANTIINHIBITOR COAGULANT CMPLX IV SOLR
500.0000 [IU] | INTRAVENOUS | Status: DC
  Filled 2012-04-22: qty 500

## 2012-04-22 MED ORDER — HYDRALAZINE HCL 20 MG/ML IJ SOLN
10.0000 mg | INTRAMUSCULAR | Status: DC | PRN
  Administered 2012-04-23 (×2): 20 mg via INTRAVENOUS
  Filled 2012-04-22: qty 1
  Filled 2012-04-22 (×2): qty 2

## 2012-04-22 MED ORDER — NICARDIPINE HCL IN NACL 20-0.86 MG/200ML-% IV SOLN
5.0000 mg/h | INTRAVENOUS | Status: DC
  Administered 2012-04-22: 5 mg/h via INTRAVENOUS
  Filled 2012-04-22: qty 200

## 2012-04-22 MED ORDER — ANTIINHIBITOR COAGULANT CMPLX IV SOLR
477.0000 [IU] | INTRAVENOUS | Status: AC
  Administered 2012-04-22: 477 [IU] via INTRAVENOUS
  Filled 2012-04-22: qty 477

## 2012-04-22 MED ORDER — PANTOPRAZOLE SODIUM 40 MG PO TBEC
40.0000 mg | DELAYED_RELEASE_TABLET | Freq: Every day | ORAL | Status: DC
Start: 2012-04-22 — End: 2012-04-22
  Administered 2012-04-22: 40 mg via ORAL
  Filled 2012-04-22: qty 1

## 2012-04-22 MED ORDER — VITAMIN K1 10 MG/ML IJ SOLN
10.0000 mg | Freq: Once | INTRAMUSCULAR | Status: AC
  Administered 2012-04-22: 10 mg via INTRAVENOUS
  Filled 2012-04-22: qty 1

## 2012-04-22 MED ORDER — LISINOPRIL 20 MG PO TABS
20.0000 mg | ORAL_TABLET | Freq: Every day | ORAL | Status: DC
  Administered 2012-04-22 – 2012-04-23 (×2): 20 mg via ORAL
  Filled 2012-04-22 (×3): qty 1

## 2012-04-22 MED ORDER — SODIUM CHLORIDE 0.9 % IV SOLN
250.0000 mL | INTRAVENOUS | Status: DC | PRN
  Administered 2012-04-22: 1000 mL via INTRAVENOUS

## 2012-04-22 MED ORDER — HYDRALAZINE HCL 20 MG/ML IJ SOLN
10.0000 mg | INTRAMUSCULAR | Status: AC
  Administered 2012-04-22: 10 mg via INTRAVENOUS
  Filled 2012-04-22: qty 1

## 2012-04-22 MED ORDER — PREDNISONE 20 MG PO TABS
20.0000 mg | ORAL_TABLET | Freq: Every day | ORAL | Status: AC
  Administered 2012-04-22 – 2012-04-24 (×3): 20 mg via ORAL
  Filled 2012-04-22 (×3): qty 1

## 2012-04-22 MED ORDER — MORPHINE SULFATE 2 MG/ML IJ SOLN
2.0000 mg | INTRAMUSCULAR | Status: DC | PRN
  Administered 2012-04-22 – 2012-04-26 (×9): 2 mg via INTRAVENOUS
  Filled 2012-04-22 (×9): qty 1

## 2012-04-22 MED ORDER — HYDRALAZINE HCL 20 MG/ML IJ SOLN: 10.0000 mg | INTRAMUSCULAR | Status: DC | PRN

## 2012-04-22 MED ORDER — LABETALOL HCL 100 MG PO TABS
100.0000 mg | ORAL_TABLET | Freq: Two times a day (BID) | ORAL | Status: DC
  Administered 2012-04-22 – 2012-04-25 (×8): 100 mg via ORAL
  Filled 2012-04-22 (×10): qty 1

## 2012-04-22 MED ORDER — ONDANSETRON HCL 4 MG PO TABS: 4.0000 mg | ORAL_TABLET | Freq: Four times a day (QID) | ORAL | Status: DC | PRN

## 2012-04-22 MED ORDER — HYDRALAZINE HCL 20 MG/ML IJ SOLN
INTRAMUSCULAR | Status: AC
  Administered 2012-04-22: 10 mg
  Filled 2012-04-22: qty 1

## 2012-04-22 NOTE — Consult Note (Signed)
NEURO HOSPITALIST CONSULT NOTE    Reason for Consult: IVH  HPI:                                                                                                                                          [name] is an 47 y.o. female who was admitted to the hospital for diarrhea, nausea and vomiting. In the ER, she was found to have elevated BP with SBP 220 DBP 120. Patient is on coumadin for Factor V Leiden deficiency at home and was also found to have a INR of 4.38 on admission. CT of head was obtained due to patient complaining of persistent HA over last 2 months.  CT revealed Intraventricular hemorrhage involving the third and fourth ventricles along with possible parenchymal hemorrhage in the corpus callosum just above the third ventricle. Her coumadin has been stopped and neurology was consulted for further evaluation of IVH. At present time she is not complaining of a HA or any localizing or lateralizing symptoms.  She does have some mild nausea which is well controlled.    Past Medical History  Diagnosis Date  . Nonspecific abnormal results of liver function study   . Personal history of venous thrombosis and embolism 2002    during pregnancy; ?factor 5 leiden (sees heme)  . Edema   . 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 Nephritis and nephropathy, not specified as acute or chronic, with unspecified pathological lesion in kidney   . Pure hypercholesterolemia   . Unspecified essential hypertension   . Other specified acquired hypothyroidism   . Obesity, unspecified   . Panic disorder without agoraphobia   . Unspecified pleural effusion   . Other pulmonary embolism and infarction   . 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 Pleural effusion 2005    c/w lupus initial w/u; recurrent Right as pred tapered off July 2011  . HLD (hyperlipidemia)   . Iron deficiency  anemia     IV dextran [place]  . 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 GERD (gastroesophageal reflux disease)     on prilosec r/t gastric sleeve surgery  . Arthritis     Lupus - hands/knees  . Anxiety state, unspecified     panic attacks  . Sleep apnea     Past Surgical History  Procedure Laterality Date  . Pleuryx catheter placement  9/11     VanTright----removed 9/11  . Thoracentesis  2010    with penumonia  . Pleurocentesis  10/2004  . Gastric sleeve  8/12    bariatric surgery Dr Adolphus Birchwood   . Wisdom tooth extraction    .  Svd      x 3  . Laparoscopic tubal ligation  12/09/2010    Procedure: LAPAROSCOPIC TUBAL LIGATION;  Surgeon: Juluis Mire;  Location: [facility];  Service: Gynecology;  Laterality: Bilateral;  Attempted see Nursing note    Family History  Problem Relation Age of Onset  . Lupus Sister   . Diabetes      GM     Social History:  reports that she quit smoking about 2 years ago. Her smoking use included Cigarettes. She has a 5 pack-year smoking history. She has never used smokeless tobacco. She reports that  drinks alcohol. She reports that she does not use illicit drugs.  Allergies  Allergen Reactions  . Amoxicillin     REACTION: Pt. reports allergy, reaction not known  . Atorvastatin     REACTION: Reaction not known  . Moxifloxacin     REACTION: hallucinations    MEDICATIONS:                                                                                                                     Prior to Admission:  Prescriptions prior to admission  Medication Sig Dispense Refill  . levothyroxine (SYNTHROID, LEVOTHROID) 175 MCG tablet Take 1 tablet (175 mcg total) by mouth daily.  90 tablet  3  . Multiple Vitamin (MULTIVITAMIN) tablet Take 1 tablet by mouth daily.        Marland Kitchen omeprazole (PRILOSEC) 20 MG capsule Take 20 mg by mouth daily.      Marland Kitchen PARoxetine (PAXIL) 40 MG tablet take 1 tablet by mouth every morning  30 tablet  5  . predniSONE  (DELTASONE) 10 MG tablet Take 20 mg by mouth daily.       . promethazine (PHENERGAN) 25 MG tablet Take 1 tablet (25 mg total) by mouth every 8 (eight) hours as needed for nausea.  20 tablet  0  . warfarin (COUMADIN) 1 MG tablet Take 3 mg by mouth See admin instructions. Pt takes #3 1 mg tabs along with 10 mg tab to =13 mg of warfarin on tue and thu      . warfarin (COUMADIN) 10 MG tablet Take 10 mg by mouth See admin instructions. Take with #3- 1mg  tablets to = 13mg  on Tuesday and Thursday      . warfarin (COUMADIN) 7.5 MG tablet Take 11.5 mg by mouth See admin instructions. Take 1.5 tablet (=11.5mg ) on All other days (sunday, Monday, Wednesday, Friday, Saturday)       Scheduled: . docusate sodium  100 mg Oral BID  . labetalol  100 mg Oral BID  . levothyroxine  175 mcg Oral QAC breakfast  . lisinopril  20 mg Oral Daily  . PARoxetine  40 mg Oral Daily  . predniSONE  20 mg Oral QAC breakfast  . sodium chloride  3 mL Intravenous Q12H     ROS:  History obtained from the patient  General ROS: positive for -  fatigue,  Psychological ROS: negative for - behavioral disorder, hallucinations, memory difficulties, mood swings or suicidal ideation Ophthalmic ROS: negative for - blurry vision, double vision, eye pain or loss of vision ENT ROS: negative for - epistaxis, nasal discharge, oral lesions, sore throat, tinnitus or vertigo Allergy and Immunology ROS: negative for - hives or itchy/watery eyes Hematological and Lymphatic ROS: negative for - bleeding problems, bruising or swollen lymph nodes Endocrine ROS: negative for - galactorrhea, hair pattern changes, polydipsia/polyuria or temperature intolerance Respiratory ROS: negative for - cough, hemoptysis, shortness of breath or wheezing Cardiovascular ROS: negative for - chest pain, dyspnea on exertion, edema or  irregular heartbeat Gastrointestinal ROS: positive for - Nausea/vomiting  Genito-Urinary ROS: negative for - dysuria, hematuria, incontinence or urinary frequency/urgency Musculoskeletal ROS: negative for - joint swelling or muscular weakness Neurological ROS: as noted in HPI Dermatological ROS: negative for rash and skin lesion changes   Blood pressure 140/90, pulse 78, temperature 97.8 F (36.6 C), temperature source Oral, resp. rate 16, height 5\' 5"  (1.651 m), weight 127 kg (279 lb 15.8 oz), SpO2 94.00%.   Neurologic Examination:                                                                                                      Mental Status: Alert, oriented, thought content appropriate.  Speech fluent without evidence of aphasia.  Able to follow 3 step commands without difficulty. Cranial Nerves: II: Discs flat bilaterally; Visual fields grossly normal, pupils equal, round, reactive to light and accommodation III,IV, VI: ptosis not present, extra-ocular motions intact bilaterally V,VII: smile symmetric, facial light touch sensation normal bilaterally VIII: hearing normal bilaterally IX,X: gag reflex present XI: bilateral shoulder shrug XII: midline tongue extension Motor: Right : Upper extremity   5/5    Left:     Upper extremity   5/5  Lower extremity   5/5     Lower extremity   5/5 Tone and bulk:normal tone throughout; no atrophy noted Sensory: Pinprick and light touch intact throughout, bilaterally Deep Tendon Reflexes: 2+ brisk throughout and symmetric throughout Plantars: Right: downgoing   Left: downgoing Cerebellar: normal finger-to-nose,  normal heel-to-shin test CV: pulses palpable throughout    Lab Results  Component Value Date/Time   CHOL 266* 06/03/2011  2:33 PM    Results for orders placed during the hospital encounter of 04/21/12 (from the past 48 hour(s))  COMPREHENSIVE METABOLIC PANEL     Status: Abnormal   Collection Time    04/21/12  3:27 PM       Result Value Range   Sodium 138  135 - 145 mEq/L   Potassium 4.2  3.5 - 5.1 mEq/L   Chloride 103  96 - 112 mEq/L   CO2 26  19 - 32 mEq/L   Glucose, Bld 117 (*) 70 - 99 mg/dL   BUN 18  6 - 23 mg/dL   Creatinine, Ser 4.54 (*) 0.50 - 1.10 mg/dL   Calcium 8.4  8.4 - 09.8 mg/dL   Total  Protein 6.7  6.0 - 8.3 g/dL   Albumin 2.8 (*) 3.5 - 5.2 g/dL   AST 30  0 - 37 U/L   ALT 33  0 - 35 U/L   Alkaline Phosphatase 136 (*) 39 - 117 U/L   Total Bilirubin 0.4  0.3 - 1.2 mg/dL   GFR calc non Af Amer 53 (*) >90 mL/min   GFR calc Af Amer 62 (*) >90 mL/min   Comment:            The eGFR has been calculated     using the CKD EPI equation.     This calculation has not been     validated in all clinical     situations.     eGFR's persistently     <90 mL/min signify     possible Chronic Kidney Disease.  CBC WITH DIFFERENTIAL     Status: Abnormal   Collection Time    04/21/12  3:27 PM      Result Value Range   WBC 4.1  4.0 - 10.5 K/uL   RBC 5.22 (*) 3.87 - 5.11 MIL/uL   Hemoglobin 14.7  12.0 - 15.0 g/dL   HCT 47.8  29.5 - 62.1 %   MCV 83.7  78.0 - 100.0 fL   MCH 28.2  26.0 - 34.0 pg   MCHC 33.6  30.0 - 36.0 g/dL   RDW 30.8  65.7 - 84.6 %   Platelets 96 (*) 150 - 400 K/uL   Comment: CONSISTENT WITH PREVIOUS RESULT   Neutrophils Relative 82 (*) 43 - 77 %   Neutro Abs 3.3  1.7 - 7.7 K/uL   Lymphocytes Relative 10 (*) 12 - 46 %   Lymphs Abs 0.4 (*) 0.7 - 4.0 K/uL   Monocytes Relative 7  3 - 12 %   Monocytes Absolute 0.3  0.1 - 1.0 K/uL   Eosinophils Relative 1  0 - 5 %   Eosinophils Absolute 0.0  0.0 - 0.7 K/uL   Basophils Relative 0  0 - 1 %   Basophils Absolute 0.0  0.0 - 0.1 K/uL  LIPASE, BLOOD     Status: None   Collection Time    04/21/12  3:27 PM      Result Value Range   Lipase 23  11 - 59 U/L  PROTIME-INR     Status: Abnormal   Collection Time    04/21/12  6:56 PM      Result Value Range   Prothrombin Time 39.1 (*) 11.6 - 15.2 seconds   INR 4.38 (*) 0.00 - 1.49  URINALYSIS,  MICROSCOPIC ONLY     Status: Abnormal   Collection Time    04/21/12  7:50 PM      Result Value Range   Color, Urine RED (*) YELLOW   Comment: BIOCHEMICALS MAY BE AFFECTED BY COLOR   APPearance TURBID (*) CLEAR   Specific Gravity, Urine 1.041 (*) 1.005 - 1.030   pH 6.0  5.0 - 8.0   Glucose, UA NEGATIVE  NEGATIVE mg/dL   Hgb urine dipstick LARGE (*) NEGATIVE   Bilirubin Urine LARGE (*) NEGATIVE   Ketones, ur 15 (*) NEGATIVE mg/dL   Protein, ur >962 (*) NEGATIVE mg/dL   Urobilinogen, UA 1.0  0.0 - 1.0 mg/dL   Nitrite POSITIVE (*) NEGATIVE   Leukocytes, UA MODERATE (*) NEGATIVE   WBC, UA 11-20  <3 WBC/hpf   RBC / HPF TOO NUMEROUS TO COUNT  <3 RBC/hpf   Bacteria,  UA MANY (*) RARE   Squamous Epithelial / LPF MANY (*) RARE   Casts HYALINE CASTS (*) NEGATIVE   Urine-Other AMORPHOUS URATES/PHOSPHATES    POCT PREGNANCY, URINE     Status: None   Collection Time    04/21/12  8:09 PM      Result Value Range   Preg Test, Ur NEGATIVE  NEGATIVE   Comment:            THE SENSITIVITY OF THIS     METHODOLOGY IS >24 mIU/mL  PREPARE FRESH FROZEN PLASMA     Status: None   Collection Time    04/22/12  2:44 AM      Result Value Range   Unit Number [id number]     Blood Component Type THAWED PLASMA     Unit division 00     Status of Unit ISSUED     Transfusion Status OK TO TRANSFUSE     Unit Number [id number]     Blood Component Type THAWED PLASMA     Unit division 00     Status of Unit ISSUED     Transfusion Status OK TO TRANSFUSE    MRSA PCR SCREENING     Status: None   Collection Time    04/22/12  3:57 AM      Result Value Range   MRSA by PCR NEGATIVE  NEGATIVE   Comment:            The GeneXpert MRSA Assay (FDA     approved for NASAL specimens     only), is one component of a     comprehensive MRSA colonization     surveillance program. It is not     intended to diagnose MRSA     infection nor to guide or     monitor treatment for     MRSA infections.  PROTIME-INR      Status: Abnormal   Collection Time    04/22/12  6:30 AM      Result Value Range   Prothrombin Time 17.2 (*) 11.6 - 15.2 seconds   INR 1.44  0.00 - 1.49    Ct Head Wo Contrast  04/22/2012  *RADIOLOGY REPORT*  Clinical Data: Headache.  Nausea.  Recent norovirus infection.  CT HEAD WITHOUT CONTRAST  Technique:  Contiguous axial images were obtained from the base of the skull through the vertex without contrast.  Comparison: CT head 04/06/2008, 10/28/2004.  MRI brain 10/28/2004.  Findings: Acute intraventricular hemorrhage involving the third and fourth ventricles.  No evidence of hydrocephalus.  No midline shift.  Volume averaging with the hemorrhage in the third ventricle versus a small focus of parenchymal hemorrhage in the corpus callosum immediately above the third ventricle (image 15).  No acute hemorrhage or hematoma elsewhere.  No extra-axial fluid collections.  Mucosal thickening involving the right maxillary sinus.  Remaining visualized paranasal sinuses, bilateral mastoid air cells, and bilateral middle ear cavities well-aerated.  Extensive bilateral carotid siphon vertebral artery atherosclerosis for age.  IMPRESSION:  1.  Intraventricular hemorrhage involving the third and fourth ventricles.  Possible parenchymal hemorrhage in the corpus callosum just above the third ventricle (versus volume averaging with the third ventricular hemorrhage). 2.  No evidence of hydrocephalus. 3.  No midline shift.  Non-emergent MRI of the brain without with contrast is recommended in further evaluation.  Critical Value/emergent results were called by telephone at the time of interpretation on 04/22/2012 at 0130 hours to [facility], of the emergency department,  who verbally acknowledged these results.   Original Report Authenticated By: Hulan Saas, M.D.      Assessment/Plan: 47 YO female with known factor V Leiden deficiency (previously on coumadin with admission INR 4.38 now 1.44) presenting to  hospital with elevated SBP in the 200's and intraventricular hemorrhage involving the third and fourth ventricles.  Currently patient shows no deficits on Exam.  Etiology most likely due to coagulopathy , however will order MRI/MRA brain to further evaluate other possibilities.   Recommend:  1) BP management keeping SBP <160 2) Hold AC and ASA 3) RPT CT head in AM 4) Agree with continuing to monitor INR, reversal of any coagulopathy.   Assessment and plan discussed with with attending physician and they are in agreement.    Felicie Morn PA-C [facility] [phone]  04/22/2012, 12:00 PM   I have seen and evaluated the patient. I have reviewed the above note and made appropriate changes. 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 MRI/MRA head to assess for other etiologies. BP goal < 160.   Ritta Slot, MD [facility] [phone]  If 7pm- 7am, please page neurology on call at [phone].

## 2012-04-22 NOTE — Progress Notes (Signed)
Requested IVs be evaluated by IV team. Infiltration identified by two IV Team nurses. Right arm edematous , elevated on two pillows. Warm packs applied. Patient denies pain or discomfort. Both IVs removed , catheter intact .

## 2012-04-22 NOTE — Consult Note (Signed)
PULMONARY  / CRITICAL CARE MEDICINE  Name: [name] MRN: [id number] DOB: [birth year]    ADMISSION DATE:  04/21/2012 CONSULTATION DATE:  04/22/2012  REFERRING MD :  Conley Rolls TRH  CHIEF COMPLAINT:  Headache, nausea/vomiting  BRIEF PATIENT DESCRIPTION: 48 y/o female with lupus who presented to the [facility] ED on 3/20 with hypertensive emergency and a small amount of intraventricular hemorrhage noted on CT head in the setting of an INR of 4.38.  SIGNIFICANT EVENTS / STUDIES:  3/20 CT head >> Intraventricular hemorrhage of 3rd/4th ventricles and possible intraparenchymal hemorrhage in the corpus callosum, no hydrocephalus, no midline shift  LINES / TUBES: Peripheral IV  CULTURES: 3/19 urine >>  ANTIBIOTICS: 3/19 ceftriaxone (UTI) >   HISTORY OF PRESENT ILLNESS:  47 y/o female with lupus who presented to the [facility] ED on 3/20 with hypertensive emergency and a small amount of intraventricular hemorrhage noted on CT head in the setting of an INR of 4.38.  She developed nausea, vomiting, and diarrhea approximately three weeks prior to admission after exposure to many folks at work with a similar illness.  After several days of these symptoms she thinks that she developed a flare of her Lupus because her joints started aching, her left hand began swelling, and she felt more weak than usual.  She saw her PCP who started her on an increased dose of prednisone for a presumable flare of her SLE.  She came to the ED on 3/19 for persistent headaches and nausea/vomiting.  She was given multiple drugs for hypertensive emergency but her blood pressure did not respond.  She was found to have a small intraventricular hemorrhage on CT head.   PAST MEDICAL HISTORY :  Past Medical History  Diagnosis Date  . Nonspecific abnormal results of liver function study   . Personal history of venous thrombosis and embolism 2002    during pregnancy; ?factor 5 leiden (sees heme)  . Edema   . 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 Nephritis and nephropathy, not specified as acute or chronic, with unspecified pathological lesion in kidney   . Pure hypercholesterolemia   . Unspecified essential hypertension   . Other specified acquired hypothyroidism   . Obesity, unspecified   . Panic disorder without agoraphobia   . Unspecified pleural effusion   . Other pulmonary embolism and infarction   . 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 Pleural effusion 2005    c/w lupus initial w/u; recurrent Right as pred tapered off July 2011  . HLD (hyperlipidemia)   . Iron deficiency anemia     IV dextran Coladonato  . 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 GERD (gastroesophageal reflux disease)     on prilosec r/t gastric sleeve surgery  . Arthritis     Lupus - hands/knees  . Anxiety state, unspecified     panic attacks  . Sleep apnea    Past Surgical History  Procedure Laterality Date  . Pleuryx catheter placement  9/11     VanTright----removed 9/11  . Thoracentesis  2010    with penumonia  . Pleurocentesis  10/2004  . Gastric sleeve  8/12    bariatric surgery Dr Adolphus Birchwood   . Wisdom tooth extraction    . Svd      x 3  . Laparoscopic tubal ligation  12/09/2010    Procedure:  LAPAROSCOPIC TUBAL LIGATION;  Surgeon: Juluis Mire;  Location: [facility];  Service: Gynecology;  Laterality: Bilateral;  Attempted see Nursing note   Prior to Admission medications   Medication Sig Start Date End Date Taking? Authorizing Provider  levothyroxine (SYNTHROID, LEVOTHROID) 175 MCG tablet Take 1 tablet (175 mcg total) by mouth daily. 06/05/11  Yes Judy Pimple, MD  Multiple Vitamin (MULTIVITAMIN) tablet Take 1 tablet by mouth daily.     Yes Historical Provider, MD  omeprazole (PRILOSEC) 20 MG capsule Take 20 mg by mouth daily. 05/20/11  Yes Judy Pimple, MD  PARoxetine (PAXIL) 40 MG tablet take 1 tablet by mouth every morning 01/29/12   Yes Judy Pimple, MD  predniSONE (DELTASONE) 10 MG tablet Take 20 mg by mouth daily.    Yes Historical Provider, MD  promethazine (PHENERGAN) 25 MG tablet Take 1 tablet (25 mg total) by mouth every 8 (eight) hours as needed for nausea. 04/16/12  Yes Judy Pimple, MD  warfarin (COUMADIN) 1 MG tablet Take 3 mg by mouth See admin instructions. Pt takes #3 1 mg tabs along with 10 mg tab to =13 mg of warfarin on tue and thu   Yes Historical Provider, MD  warfarin (COUMADIN) 10 MG tablet Take 10 mg by mouth See admin instructions. Take with #3- 1mg  tablets to = 13mg  on Tuesday and Thursday   Yes Historical Provider, MD  warfarin (COUMADIN) 7.5 MG tablet Take 11.5 mg by mouth See admin instructions. Take 1.5 tablet (=11.5mg ) on All other days (sunday, Monday, Wednesday, Friday, Saturday)   Yes Historical Provider, MD   Allergies  Allergen Reactions  . Amoxicillin     REACTION: Pt. reports allergy, reaction not known  . Atorvastatin     REACTION: Reaction not known  . Moxifloxacin     REACTION: hallucinations    FAMILY HISTORY:  Family History  Problem Relation Age of Onset  . Lupus Sister   . Diabetes      GM   SOCIAL HISTORY:  reports that she quit smoking about 2 years ago. Her smoking use included Cigarettes. She has a 5 pack-year smoking history. She has never used smokeless tobacco. She reports that  drinks alcohol. She reports that she does not use illicit drugs.  REVIEW OF SYSTEMS:   Gen: Denies fever, chills, weight change, + fatigue, night sweats HEENT: Denies blurred vision, double vision, hearing loss, tinnitus, sinus congestion, rhinorrhea, sore throat, neck stiffness, dysphagia PULM: Denies shortness of breath, cough, sputum production, hemoptysis, wheezing CV: Denies chest pain, edema, orthopnea, paroxysmal nocturnal dyspnea, palpitations GI:  Per HPI GU: Denies dysuria, hematuria, polyuria, oliguria, urethral discharge Endocrine: Denies hot or cold intolerance, polyuria,  polyphagia or appetite change Derm: Denies rash, dry skin, scaling or peeling skin change Heme: Denies easy bruising, bleeding, bleeding gums Neuro: + headache, denies numbness, weakness, slurred speech, loss of memory or consciousness   SUBJECTIVE:   VITAL SIGNS: Temp:  [97.8 F (36.6 C)-98.2 F (36.8 C)] 98.2 F (36.8 C) (03/20 0302) Pulse Rate:  [57-86] 73 (03/20 0240) Resp:  [11-22] 15 (03/20 0240) BP: (174-213)/(95-136) 193/96 mmHg (03/20 0240) SpO2:  [91 %-99 %] 96 % (03/20 0240) HEMODYNAMICS:   VENTILATOR SETTINGS:   INTAKE / OUTPUT: Intake/Output     03 /19 0701 - 03/20 0700   I.V. 1000   Total Intake 1000   Net +1000         PHYSICAL EXAMINATION:  Gen: chronically ill appearing, nauseated HEENT: NCAT, PERRL, EOMi,  OP clear, neck supple without masses PULM: CTA B CV: RRR, slight systolic murmur, no JVD AB: BS+, soft, nontender, no hsm Ext: warm, no edema, no clubbing, no cyanosis Derm: malar rash, purpuric rash over L hand/upper extremity Neuro: A&Ox4, CN II-XII intact, strength 5/5 in all 4 extremities   LABS:  Recent Labs Lab 04/16/12 1015 04/16/12 1117 04/21/12 1527 04/21/12 1856  HGB 14.2 14.3 14.7  --   WBC 3.9*  --  4.1  --   PLT 108*  --  96*  --   NA 139 139 138  --   K 4.3 4.2 4.2  --   CL 102 106 103  --   CO2 27  --  26  --   GLUCOSE 97 97 117*  --   BUN 15 17 18   --   CREATININE 1.03 1.00 1.21*  --   CALCIUM 8.6  --  8.4  --   AST 26  --  30  --   ALT 21  --  33  --   ALKPHOS 129*  --  136*  --   BILITOT 0.5  --  0.4  --   PROT 7.1  --  6.7  --   ALBUMIN 3.0*  --  2.8*  --   INR  --   --   --  4.38*   No results found for this basename: GLUCAP,  in the last 168 hours  CXR:   ASSESSMENT / PLAN:  NEUROLOGIC A:  Intraventricular hemorrhage without hydrocephalus in setting of coagulopathy from warfarin; possible intraparenchymal hemorrhage in corpus callosum P:   -warfarin correction protocol -BP management as per  Cardiology, below -MRI brain 3/20 to evaluate for intraparenchymal hemorrhage -consider formal neurology or neurosurgery consult on 3/20 -monitor mental status/neurologic status closely, would need shunt if evidence of hydrocephalus  PULMONARY A:  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 P:   -check spot Na/Cr -check DS DNA, complement levels -renal ultrasound  GASTROINTESTINAL A:  Nausea, vomiting, diarrhea; viral gastroenteritis? Abdominal exam benign Diarrhea for > 3 weeks, consider chronic causes (malabsorption, parasite, etc) P:   -prn zofran -check stool studies >> culture, norovirus, fat  HEMATOLOGIC A:   Chronic DVT/PE, s/p IVC filter, on lifelong wafarin P:  -with intracranial hemorrhage, need to stop warfarin -no consideration for restart warfarin for at least six months, likely never again.   -need to consider risk of recurrent DVT with IVC filter in place (increases risk)  INFECTIOUS A:  UTI P:   -ceftriaxone -f/u urine culture  ENDOCRINE A:  Hypothyroidism P:   -home synthroid  Rheum A: SLE, in flare? P: -check DS DNA as sensitive to flare -check complement level -continue prednisone at higher that baseline value (20mg  daily)    TODAY'S SUMMARY:   I have personally obtained a history, examined the patient, evaluated laboratory and imaging results, formulated the assessment and plan and placed orders. CRITICAL CARE: The patient is critically ill with multiple organ systems failure and requires  high complexity decision making for assessment and support, frequent evaluation and titration of therapies, application  of advanced monitoring technologies and extensive interpretation of multiple databases. Critical Care Time devoted to patient care services described in this note is 60 minutes.   Fonnie Jarvis Pulmonary and Critical Care Medicine [facility] Pager: [phone]  04/22/2012, 3:41 AM

## 2012-04-22 NOTE — ED Notes (Signed)
Dr Nedra Hai Notified of CT scan result and will hold off on admission for nw=ow

## 2012-04-22 NOTE — Procedures (Signed)
Arterial Catheter Insertion Procedure Note LASHANE WHELPLEY [id number] February 26, 1965  Procedure: Insertion of Arterial Catheter  Indications: Blood pressure monitoring and Frequent blood sampling  Procedure Details Consent: Risks of procedure as well as the alternatives and risks of each were explained to the (patient/caregiver).  Consent for procedure obtained. Time Out: Verified patient identification, verified procedure, site/side was marked, verified correct patient position, special equipment/implants available, medications/allergies/relevent history reviewed, required imaging and test results available.  Performed  Maximum sterile technique was used including antiseptics, cap, gloves, gown, hand hygiene, mask and sheet. Skin prep: Chlorhexidine; local anesthetic administered 20 gauge catheter was inserted into right radial artery using the Seldinger technique.  Evaluation Blood flow good; BP tracing good. Complications: No apparent complications.   Inez Pilgrim 04/22/2012

## 2012-04-22 NOTE — ED Provider Notes (Signed)
Medical screening examination/treatment/procedure(s) were performed by non-physician practitioner and as supervising physician I was immediately available for consultation/collaboration.  Ellwyn Ergle, MD 04/22/12 1425 

## 2012-04-22 NOTE — Telephone Encounter (Signed)
[name] with [employer] disability left v/m requesting records supporting disability due to lupus. [name] with [employer] to fax request for info needed and pt release of info form. [name] will fax request to Dr Milinda Antis.

## 2012-04-22 NOTE — ED Notes (Signed)
Stroke swallow Screen not performed pt is  NPO as ordered by MD

## 2012-04-22 NOTE — Progress Notes (Signed)
  At 0600, art-line place by RRT. Waveform of art-line showing significantly higher BP than cuff pressure. (SBP over 50 points higher on art than cuff).  [facility] notified. Dr. Marchelle Gearing gave orders to titrate Cardene based on art-line, not cuff. Will continue to monitor.   Oletta Cohn RN

## 2012-04-22 NOTE — Progress Notes (Signed)
eLink Physician-Brief Progress Note Patient Name: [name] DOB: [birth year] MRN: [id number]  Date of Service  04/22/2012   HPI/Events of Note   call from Dr. Marguerite Olea of [facility]. Patient with hypertensive emergency and small intraventricular bleed. He feels patient needs to be triaged to neuro intensive care unit. He has started the patient on a Cardene drip. He spoke to neurosurgery Dr. Gerlene Fee who apparently feels conservative management is the best   eICU Interventions   critical care medicine to admit the patient to neuro intensive care unit    Intervention Category Intermediate Interventions: Other:  Denali Sharma 04/22/2012, 2:50 AM

## 2012-04-22 NOTE — Progress Notes (Signed)
No distress. Remains on nicardipine. Will try to transition to PO meds. Have requested Neuro consult   Billy Fischer, MD ; [facility] service Mobile [phone].  After 5:30 PM or weekends, call [phone]

## 2012-04-22 NOTE — Progress Notes (Signed)
Utilization review completed.  P.J. Mareena Cavan,RN,BSN Case Manager [phone]  

## 2012-04-22 NOTE — ED Notes (Signed)
Need CPAP,  respiratory notified.[name])

## 2012-04-22 NOTE — Progress Notes (Signed)
Notified MD of UA with many bacteria. Pt afebrile. No further orders given at this time. Will continue to monitor.

## 2012-04-22 NOTE — H&P (Signed)
[facility] History and Physical  [name] ZOX:[id number] DOB: [birth year]    PCP:   Roxy Manns, MD   Chief Complaint: HA.  HPI: [name] is an 47 y.o. 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  She also has a persistent HA which has been going on since at least Jan 14.  In the ER, she was found to be severely HTN with SBP 220 DBP 120.  Hospitalist was asked to admit her for hypertensive urgency.  Further work up included normal renal fx tests, normal lipase, and WBC of 3.9K.  I reviewed her record and found that she was HTN as well last visit in the ER with SBP 170/ DBP 110.  She also said that  she was hypertensive before her Bariatric surgery, but no longer on any antiHTN meds since the surgery.  She has been on chronic prednisone 5mg  per day until last week, where it was increased to 20mg  per day.  Rewiew of Systems:  Constitutional: Negative for malaise, fever and chills. No significant weight loss or weight gain Eyes: Negative for eye pain, redness and discharge, diplopia, visual changes, or flashes of light. ENMT: Negative for ear pain, hoarseness, nasal congestion, sinus pressure and sore throat. No headaches; tinnitus, drooling, or problem swallowing. Cardiovascular: Negative for chest pain, palpitations, diaphoresis, dyspnea and peripheral edema. ; No orthopnea, PND Respiratory: Negative for cough, hemoptysis, wheezing and stridor. No pleuritic chestpain. Gastrointestinal: Negative for constipation, abdominal pain, melena, blood in stool, hematemesis, jaundice and rectal bleeding.    Genitourinary: Negative for frequency, dysuria, incontinence,flank pain and hematuria; Musculoskeletal: Negative for back pain and neck pain. Negative for swelling and trauma.;  Skin: . Negative for  pruritus, rash, abrasions, bruising and skin lesion.; ulcerations Neuro: Negative for lightheadedness and neck stiffness. Negative for weakness, altered level of consciousness , altered mental status, extremity weakness, burning feet, involuntary movement, seizure and syncope.  Psych: negative for depression, insomnia, tearfulness, panic attacks, hallucinations, paranoia, suicidal or homicidal ideation.   Past Medical History  Diagnosis Date  . Nonspecific abnormal results of liver function study   . Personal history of venous thrombosis and embolism 2002    during pregnancy; ?factor 5 leiden (sees heme)  . Edema   . 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 Nephritis and nephropathy, not specified as acute or chronic, with unspecified pathological lesion in kidney   . Pure hypercholesterolemia   . Unspecified essential hypertension   . Other specified acquired hypothyroidism   . Obesity, unspecified   . Panic disorder without agoraphobia   . Unspecified pleural effusion   . Other pulmonary embolism and infarction   . 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 Pleural effusion 2005    c/w lupus initial w/u; recurrent Right as pred tapered off July 2011  . HLD (hyperlipidemia)   . Iron deficiency anemia     IV dextran Coladonato  . 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 GERD (gastroesophageal reflux disease)     on prilosec r/t gastric sleeve surgery  . Arthritis     Lupus - hands/knees  . Anxiety state, unspecified     panic attacks  . Sleep apnea  Past Surgical History  Procedure Laterality Date  . Pleuryx catheter placement  9/11     VanTright----removed 9/11  . Thoracentesis  2010    with penumonia  . Pleurocentesis  10/2004  . Gastric sleeve  8/12    bariatric surgery Dr Adolphus Birchwood   . Wisdom tooth extraction    . Svd      x 3  . Laparoscopic tubal ligation   12/09/2010    Procedure: LAPAROSCOPIC TUBAL LIGATION;  Surgeon: Juluis Mire;  Location: [facility];  Service: Gynecology;  Laterality: Bilateral;  Attempted see Nursing note    Medications:  HOME MEDS: Prior to Admission medications   Medication Sig Start Date End Date Taking? Authorizing Provider  levothyroxine (SYNTHROID, LEVOTHROID) 175 MCG tablet Take 1 tablet (175 mcg total) by mouth daily. 06/05/11  Yes Judy Pimple, MD  Multiple Vitamin (MULTIVITAMIN) tablet Take 1 tablet by mouth daily.     Yes Historical Provider, MD  omeprazole (PRILOSEC) 20 MG capsule Take 20 mg by mouth daily. 05/20/11  Yes Judy Pimple, MD  PARoxetine (PAXIL) 40 MG tablet take 1 tablet by mouth every morning 01/29/12  Yes Judy Pimple, MD  predniSONE (DELTASONE) 10 MG tablet Take 20 mg by mouth daily.    Yes Historical Provider, MD  promethazine (PHENERGAN) 25 MG tablet Take 1 tablet (25 mg total) by mouth every 8 (eight) hours as needed for nausea. 04/16/12  Yes Judy Pimple, MD  warfarin (COUMADIN) 1 MG tablet Take 3 mg by mouth See admin instructions. Pt takes #3 1 mg tabs along with 10 mg tab to =13 mg of warfarin on tue and thu   Yes Historical Provider, MD  warfarin (COUMADIN) 10 MG tablet Take 10 mg by mouth See admin instructions. Take with #3- 1mg  tablets to = 13mg  on Tuesday and Thursday   Yes Historical Provider, MD  warfarin (COUMADIN) 7.5 MG tablet Take 11.5 mg by mouth See admin instructions. Take 1.5 tablet (=11.5mg ) on All other days (sunday, Monday, Wednesday, Friday, Saturday)   Yes Historical Provider, MD     Allergies:  Allergies  Allergen Reactions  . Amoxicillin     REACTION: Pt. reports allergy, reaction not known  . Atorvastatin     REACTION: Reaction not known  . Moxifloxacin     REACTION: hallucinations    Social History:   reports that she quit smoking about 2 years ago. Her smoking use included Cigarettes. She has a 5 pack-year smoking history. She has never used smokeless  tobacco. She reports that  drinks alcohol. She reports that she does not use illicit drugs.  Family History: Family History  Problem Relation Age of Onset  . Lupus Sister   . Diabetes      GM     Physical Exam: Filed Vitals:   04/21/12 2200 04/21/12 2220 04/21/12 2300 04/22/12 0000  BP: 206/121 209/123 208/136 209/118  Pulse: 57 60 65 59  Temp:      TempSrc:      Resp: 15 13 15 12   SpO2: 94% 91% 92% 94%   Blood pressure 209/118, pulse 59, temperature 97.8 F (36.6 C), temperature source Oral, resp. rate 12, SpO2 94.00%.  GEN:  Pleasant  patient lying in the stretcher in no acute distress; cooperative with exam. PSYCH:  alert and oriented x4; does not appear anxious or depressed; affect is appropriate. HEENT: Mucous membranes pink and anicteric; PERRLA; EOM intact; no cervical lymphadenopathy nor thyromegaly or carotid bruit; no  JVD; There were no stridor. Neck is very supple. Breasts:: Not examined CHEST WALL: No tenderness CHEST: Normal respiration, clear to auscultation bilaterally.  HEART: Regular rate and rhythm.  There are no murmur, rub, or gallops.   BACK: No kyphosis or scoliosis; no CVA tenderness ABDOMEN: soft and non-tender; no masses, no organomegaly, normal abdominal bowel sounds; no pannus; no intertriginous candida. There is no rebound and no distention. Rectal Exam: Not done EXTREMITIES: No bone or joint deformity; age-appropriate arthropathy of the hands and knees; no edema; no ulcerations.  There is no calf tenderness. Genitalia: not examined PULSES: 2+ and symmetric SKIN: Normal hydration no rash or ulceration CNS: Cranial nerves 2-12 grossly intact no focal lateralizing neurologic deficit.  Speech is fluent; uvula elevated with phonation, facial symmetry and tongue midline. DTR are normal bilaterally, cerebella exam is intact, barbinski is negative and strengths are equaled bilaterally.  No sensory loss.   Labs on Admission:  Basic Metabolic  Panel:  Recent Labs Lab 04/16/12 1015 04/16/12 1117 04/21/12 1527  NA 139 139 138  K 4.3 4.2 4.2  CL 102 106 103  CO2 27  --  26  GLUCOSE 97 97 117*  BUN 15 17 18   CREATININE 1.03 1.00 1.21*  CALCIUM 8.6  --  8.4   Liver Function Tests:  Recent Labs Lab 04/16/12 1015 04/21/12 1527  AST 26 30  ALT 21 33  ALKPHOS 129* 136*  BILITOT 0.5 0.4  PROT 7.1 6.7  ALBUMIN 3.0* 2.8*    Recent Labs Lab 04/21/12 1527  LIPASE 23   No results found for this basename: AMMONIA,  in the last 168 hours CBC:  Recent Labs Lab 04/16/12 1015 04/16/12 1117 04/21/12 1527  WBC 3.9*  --  4.1  NEUTROABS 3.0  --  3.3  HGB 14.2 14.3 14.7  HCT 42.7 42.0 43.7  MCV 84.6  --  83.7  PLT 108*  --  96*   Cardiac Enzymes: No results found for this basename: CKTOTAL, CKMB, CKMBINDEX, TROPONINI,  in the last 168 hours  CBG: No results found for this basename: GLUCAP,  in the last 168 hours   Radiological Exams on Admission: No results found.   Assessment/Plan Present on Admission:  . OSA on CPAP . ANXIETY . PULMONARY EMBOLISM . HYPOTHYROIDISM, POSTABLATION . HYPERTENSION, ESSENTIAL NOS . HYPERCHOLESTEROLEMIA . OBESITY Hypertensive urgency +++ Diarrhea, nausea vomiting.  PLAN:  Will admit her for severe HTN and to work up for diarrhea.  She has been HTN since at least Jan 14.  Will start her on Lisinopril 20mg  per day, and use IV Hydralazine for PRN HTN.   I will continue her coumadin as chronic anticoagulation for PE/DVT/Lyden V.   For her OSA, will continue CPAP q hs.  Will give some pain meds for her neck pain and HA.  For her diarrhea, will get stool studies and give IV antiemetics.  For her Lupus, will continue Prednisone at 20mg  per day.  Will also obtain a head CT to exclude a bleed as well since she is HTN and on coumadin.  She is stable, full code, and will be admitted to telemetry under [facility] service. Thank you for allowing me to participate in the care of your nice  patient.  Other plans as per orders.  Code Status: FULL Unk Lightning, MD. [facility] Pager [phone] 7pm to 7am.  04/22/2012, 1:09 AM

## 2012-04-23 ENCOUNTER — Encounter (HOSPITAL_COMMUNITY): Payer: Self-pay | Admitting: Radiology

## 2012-04-23 ENCOUNTER — Inpatient Hospital Stay (HOSPITAL_COMMUNITY): Payer: Managed Care, Other (non HMO)

## 2012-04-23 DIAGNOSIS — I10 Essential (primary) hypertension: Secondary | ICD-10-CM

## 2012-04-23 DIAGNOSIS — I629 Nontraumatic intracranial hemorrhage, unspecified: Principal | ICD-10-CM

## 2012-04-23 LAB — PROTIME-INR
INR: 0.95 (ref 0.00–1.49)
Prothrombin Time: 12.6 seconds (ref 11.6–15.2)

## 2012-04-23 LAB — BASIC METABOLIC PANEL
BUN: 29 mg/dL — ABNORMAL HIGH (ref 6–23)
Chloride: 107 mEq/L (ref 96–112)
GFR calc Af Amer: 44 mL/min — ABNORMAL LOW (ref >90)
GFR calc non Af Amer: 38 mL/min — ABNORMAL LOW (ref >90)
Potassium: 4 mEq/L (ref 3.5–5.1)
Sodium: 141 mEq/L (ref 135–145)

## 2012-04-23 LAB — CBC
HCT: 36.7 % (ref 36.0–46.0)
Hemoglobin: 12.2 g/dL (ref 12.0–15.0)
MCHC: 33.2 g/dL (ref 30.0–36.0)
RBC: 4.38 MIL/uL (ref 3.87–5.11)
WBC: 4.7 10*3/uL (ref 4.0–10.5)

## 2012-04-23 LAB — PREPARE FRESH FROZEN PLASMA: Unit division: 0

## 2012-04-23 LAB — URINE CULTURE
Colony Count: NO GROWTH
Culture: NO GROWTH

## 2012-04-23 MED ORDER — PANTOPRAZOLE SODIUM 40 MG PO TBEC
40.0000 mg | DELAYED_RELEASE_TABLET | Freq: Every day | ORAL | Status: DC
  Administered 2012-04-23: 40 mg via ORAL
  Filled 2012-04-23: qty 1

## 2012-04-23 MED ORDER — HYDRALAZINE HCL 20 MG/ML IJ SOLN
10.0000 mg | INTRAMUSCULAR | Status: DC | PRN
  Administered 2012-04-24: 20 mg via INTRAVENOUS
  Administered 2012-04-24: 10 mg via INTRAVENOUS
  Filled 2012-04-23: qty 2

## 2012-04-23 MED ORDER — ALUM & MAG HYDROXIDE-SIMETH 200-200-20 MG/5ML PO SUSP: 15.0000 mL | Freq: Four times a day (QID) | ORAL | Status: DC | PRN

## 2012-04-23 MED ORDER — PANTOPRAZOLE SODIUM 40 MG IV SOLR
40.0000 mg | INTRAVENOUS | Status: DC
  Filled 2012-04-23: qty 40

## 2012-04-23 MED ORDER — AMLODIPINE BESYLATE 5 MG PO TABS
5.0000 mg | ORAL_TABLET | Freq: Every day | ORAL | Status: DC
  Administered 2012-04-23: 5 mg via ORAL
  Filled 2012-04-23 (×3): qty 1

## 2012-04-23 NOTE — Progress Notes (Signed)
Pt profile: 47 y/o female with lupus who presented to the [facility] ED on 3/20 with hypertensive emergency and a small amount of intraventricular hemorrhage noted on CT head in the setting of an INR of 4.38.   Studies/Events: 3/20 CT head: Intraventricular hemorrhage involving the third and fourth ventricles. Possible parenchymal hemorrhage in the corpus callosum just above the third ventricle  3/20 MRI brain: The hemorrhage is predominately within the left thalamus with slight extension into the third ventricle. There is no hydrocephalus. 3/21 Ct head: Acute focus of hemorrhage in the left thalamus, third ventricle, and fourth ventricle. Slight increase in measurement of the thalamic portion of the hemorrhage. No developing mass effect or ventricular dilatation   Consults:  Stroke 3/20   Subj: No new complaints. Off nicardipine X 18 hrs. No distress  Obj: Filed Vitals:   04/23/12 0900  BP: 165/95  Pulse: 83  Temp:   Resp: 15    Gen: NAD HEENT: WNL Neck: JVP not visualized Chest: Clear Cardiac: RRR Abd: obese, soft, NT, NABS Ext: no edema Neuro: No focal deficits   BMET    Component Value Date/Time   NA 137 04/22/2012 1000   K 4.4 04/22/2012 1000   CL 104 04/22/2012 1000   CO2 24 04/22/2012 1000   GLUCOSE 121* 04/22/2012 1000   BUN 23 04/22/2012 1000   CREATININE 1.33* 04/22/2012 1000   CALCIUM 7.7* 04/22/2012 1000   GFRNONAA 47* 04/22/2012 1000   GFRAA 55* 04/22/2012 1000    CBC    Component Value Date/Time   WBC 4.1 04/21/2012 1527   RBC 5.22* 04/21/2012 1527   HGB 14.7 04/21/2012 1527   HCT 43.7 04/21/2012 1527   PLT 96* 04/21/2012 1527   MCV 83.7 04/21/2012 1527   MCH 28.2 04/21/2012 1527   MCHC 33.6 04/21/2012 1527   RDW 14.2 04/21/2012 1527   LYMPHSABS 0.4* 04/21/2012 1527   MONOABS 0.3 04/21/2012 1527   EOSABS 0.0 04/21/2012 1527   BASOSABS 0.0 04/21/2012 1527    CXR: no new film   IMPRESSION: Hypertensive ICH Chronic warfarin - reversal protocol 3/20 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  After 5:30 PM or weekends, call [phone]

## 2012-04-23 NOTE — Progress Notes (Signed)
BP elevated after transfer to the floor. RN to administer hydralazine. Discussed with Dr. Marjory Lies. Will raise SBP goal to < 180.  Annie Main, MSN, RN, ANVP-BC, ANP-BC, [facility] Pager: [phone] 04/23/2012 2:03 PM  I have personally obtained a history, examined the patient, evaluated imaging results, and formulated the assessment and plan of care. I agree with the above.

## 2012-04-23 NOTE — Progress Notes (Signed)
Stroke Team Progress Note  HISTORY [name] is an 47 y.o. female who was admitted to the hospital 04/21/2012 for diarrhea, nausea and vomiting. In the ER, she was found to have elevated BP with SBP 220 DBP 120. Patient is on coumadin for Factor V Leiden deficiency at home and was also found to have a INR of 4.38 on admission. CT of head was obtained due to patient complaining of persistent HA over last 2 months. CT revealed Intraventricular hemorrhage involving the third and fourth ventricles along with possible parenchymal hemorrhage in the corpus callosum just above the third ventricle. Her coumadin has been stopped and neurology was consulted for further evaluation of IVH. At present time she is not complaining of a HA or any localizing or lateralizing symptoms. She does have some mild nausea which is well controlled. She was admitted to the neuro ICU for further evaluation and treatment.  SUBJECTIVE Her mother is at the bedside.  Overall she feels her condition is gradually improving, though overall she feels like "crap". HA today is 5/10. On day of admission, was 9/10.   OBJECTIVE Most recent Vital Signs: Filed Vitals:   04/23/12 0700 04/23/12 0800 04/23/12 0900 04/23/12 1000  BP: 160/93 169/87 165/95 159/85  Pulse: 84 82 83 86  Temp:  98.3 F (36.8 C)    TempSrc:  Oral    Resp: 17 16 15 17   Height:      Weight:      SpO2: 93% 95% 94% 94%   CBG (last 3)  No results found for this basename: GLUCAP,  in the last 72 hours  IV Fluid Intake:     MEDICATIONS  . amLODipine  5 mg Oral Daily  . docusate sodium  100 mg Oral BID  . labetalol  100 mg Oral BID  . levothyroxine  175 mcg Oral QAC breakfast  . lisinopril  20 mg Oral Daily  . PARoxetine  40 mg Oral Daily  . predniSONE  20 mg Oral QAC breakfast  . sodium chloride  3 mL Intravenous Q12H   PRN:  sodium chloride, hydrALAZINE, morphine injection, ondansetron (ZOFRAN) IV, ondansetron  Diet:  Cardiac thin liquids Activity:   Up ad lib DVT Prophylaxis:  SCDs   CLINICALLY SIGNIFICANT STUDIES Basic Metabolic Panel:   Recent Labs Lab 04/22/12 1000 04/23/12 0920  NA 137 141  K 4.4 4.0  CL 104 107  CO2 24 24  GLUCOSE 121* 101*  BUN 23 29*  CREATININE 1.33* 1.59*  CALCIUM 7.7* 8.1*   Liver Function Tests:   Recent Labs Lab 04/21/12 1527  AST 30  ALT 33  ALKPHOS 136*  BILITOT 0.4  PROT 6.7  ALBUMIN 2.8*   CBC:   Recent Labs Lab 04/21/12 1527 04/23/12 0920  WBC 4.1 4.7  NEUTROABS 3.3  --   HGB 14.7 12.2  HCT 43.7 36.7  MCV 83.7 83.8  PLT 96* 99*   Coagulation:   Recent Labs Lab 04/21/12 1856 04/22/12 0630 04/23/12 0920  LABPROT 39.1* 17.2* 12.6  INR 4.38* 1.44 0.95   Cardiac Enzymes: No results found for this basename: CKTOTAL, CKMB, CKMBINDEX, TROPONINI,  in the last 168 hours Urinalysis:   Recent Labs Lab 04/21/12 1950  COLORURINE RED*  LABSPEC 1.041*  PHURINE 6.0  GLUCOSEU NEGATIVE  HGBUR LARGE*  BILIRUBINUR LARGE*  KETONESUR 15*  PROTEINUR >300*  UROBILINOGEN 1.0  NITRITE POSITIVE*  LEUKOCYTESUR MODERATE*   Lipid Panel    Component Value Date/Time   CHOL 266*  06/03/2011 1433   TRIG 113.0 06/03/2011 1433   HDL 48.70 06/03/2011 1433   CHOLHDL 5 06/03/2011 1433   VLDL 22.6 06/03/2011 1433   LDLCALC  Value: 192        Total Cholesterol/HDL:CHD Risk Coronary Heart Disease Risk Table                     Men   Women  1/2 Average Risk   3.4   3.3  Average Risk       5.0   4.4  2 X Average Risk   9.6   7.1  3 X Average Risk  23.4   11.0        Use the calculated Patient Ratio above and the CHD Risk Table to determine the patient's CHD Risk.        ATP III CLASSIFICATION (LDL):  <100     mg/dL   Optimal  161-096  mg/dL   Near or Above                    Optimal  130-159  mg/dL   Borderline  045-409  mg/dL   High  >811     mg/dL   Very High* 10/18/7827 1729   HgbA1C  Lab Results  Component Value Date   HGBA1C 5.7 06/03/2011    Urine Drug Screen:     Component Value  Date/Time   LABOPIA NTD 04/18/2009 1254   COCAINSCRNUR NTD 04/18/2009 1254   LABBENZ NTD 04/18/2009 1254   AMPHETMU NTD 04/18/2009 1254   THCU NTD 04/18/2009 1254   LABBARB NTD 04/18/2009 1254    Alcohol Level: No results found for this basename: ETH,  in the last 168 hours  US Renal Port 04/22/2012   No hydronephrosis.  Minimal perinephric fluid noted bilaterally. Etiology indeterminate.   Original Report Authenticated By: Lacy Duverney, M.D.    CT of the brain   04/23/2012  Acute focus of hemorrhage in the left thalamus, third ventricle, and fourth ventricle.  Slight increase in measurement of the thalamic portion of the hemorrhage.  No developing mass effect or ventricular dilatation.    04/22/2012   1.  Intraventricular hemorrhage involving the third and fourth ventricles.  Possible parenchymal hemorrhage in the corpus callosum just above the third ventricle (versus volume averaging with the third ventricular hemorrhage). 2.  No evidence of hydrocephalus. 3.  No midline shift.     MRI of the brain  04/22/2012  1.  The hemorrhage is predominately within the left thalamus with slight extension into the third ventricle.  There is no hydrocephalus. 2.  T2 hyperintensities within the cerebral peduncle and brain stem.  This may be related to the acute event.  Chronic microvascular ischemic changes are also considered. 3.  Right maxillary sinus disease.  MRA of the brain  04/22/2012 Normal variant MRA circle of Willis without evidence for significant proximal stenosis, aneurysm, or branch vessel occlusion.    2D Echocardiogram    Carotid Doppler    CXR    EKG  .   Therapy Recommendations   Physical Exam    GENERAL EXAM: Patient is in MILD DISTRESS. HEAD FLEXION CAUSES LEFT NECK PAIN. SKIN STIGMATA OF LUPUS IN HANDS AND NECK.  CARDIOVASCULAR: Regular rate and rhythm, no murmurs, no carotid bruits  NEUROLOGIC: MENTAL STATUS: awake, alert, language fluent, comprehension intact, naming  intact CRANIAL NERVE: pupils equal and reactive to light, visual fields full to confrontation, extraocular muscles intact,  no nystagmus, facial sensation and strength symmetric, uvula midline, shoulder shrug symmetric, tongue midline. MOTOR: normal bulk and tone, full strength in the BUE, BLE SENSORY: normal and symmetric to light touch COORDINATION: finger-nose-finger, fine finger movements normal REFLEXES: deep tendon reflexes present and symmetric; EXCEPT SLIGHTLY BRISK IN LUE.   ASSESSMENT [name] is a 47 y.o. female presenting with diarrhea, nausea, vomiting, headache in the setting of BP 220/120 and elevated INR of 4.38 on warfarin. 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 Hemorrhage secondary to malignant hypertension.  On warfarin prior to admission. Patient with resultant headache, nausea. Work up underway.   Factor V Leiden on chronic coumadin Hyperlipidemia, not on statin PTA Malignant Hypertension Morbid obesity, Body mass index is 46.59 kg/(m^2). Systemic lupus erythematosus Obstructive sleep apnea  Abnormal UA, urine culture negative hx bariatric surgery (gastric sleeve)  Hospital day # 2  TREATMENT/PLAN  Agree with plans for transfer to the floor  SBP goal < 160  OOB, therapy evals  Consider F/u CT head Sat or Sunday if indicated   Annie Main, MSN, RN, ANVP-BC, ANP-BC, GNP-BC [facility] Pager: [phone] 04/23/2012 10:51 AM  I have personally obtained a history, examined the patient, evaluated imaging results, and formulated the assessment and plan of care. I agree with the above. Keep SBP < 160. No anticoagulation for now. HA and nausea control.   Suanne Marker, MD 04/23/2012, 11:11 AM Certified in Neurology, Neurophysiology and Neuroimaging [facility] - Stroke Team  Please refer to [email or web address] for on-call Stroke MD

## 2012-04-23 NOTE — Progress Notes (Signed)
Pt arrived to room 4N25.  C/O slight nausea from transport, but otherwise no other complaints.  BP 188/107 on arrival.  On recheck it was 205/106.  Annie Main, NP called and notified. Parameters changed to treat for systolic >180.  Hydralazine 20 mg given IV.  Will continue to monitor patient.

## 2012-04-23 NOTE — Progress Notes (Signed)
Patient placed on auto CPAP via nasal mask. Patient tolerating well at this time, RT will continue to monitor.

## 2012-04-23 NOTE — Progress Notes (Signed)
Two lab techs tried unsuccessfully to obtain labs. Very difficult stick per lab personnel.

## 2012-04-24 DIAGNOSIS — I619 Nontraumatic intracerebral hemorrhage, unspecified: Secondary | ICD-10-CM

## 2012-04-24 DIAGNOSIS — N179 Acute kidney failure, unspecified: Secondary | ICD-10-CM | POA: Diagnosis present

## 2012-04-24 DIAGNOSIS — R7309 Other abnormal glucose: Secondary | ICD-10-CM

## 2012-04-24 LAB — PROTIME-INR
INR: 1.01 (ref 0.00–1.49)
Prothrombin Time: 13.2 seconds (ref 11.6–15.2)

## 2012-04-24 LAB — CK: Total CK: 20 U/L (ref 7–177)

## 2012-04-24 MED ORDER — ONDANSETRON HCL 4 MG/2ML IJ SOLN
4.0000 mg | INTRAMUSCULAR | Status: DC
  Administered 2012-04-24 – 2012-04-25 (×2): 4 mg via INTRAVENOUS
  Filled 2012-04-24 (×2): qty 2

## 2012-04-24 MED ORDER — ALUM & MAG HYDROXIDE-SIMETH 200-200-20 MG/5ML PO SUSP
15.0000 mL | ORAL | Status: DC | PRN
  Administered 2012-04-24: 15 mL via ORAL
  Filled 2012-04-24: qty 30

## 2012-04-24 MED ORDER — PANTOPRAZOLE SODIUM 40 MG PO TBEC
40.0000 mg | DELAYED_RELEASE_TABLET | Freq: Once | ORAL | Status: AC
  Administered 2012-04-24: 40 mg via ORAL
  Filled 2012-04-24: qty 1

## 2012-04-24 MED ORDER — HYDROCODONE-ACETAMINOPHEN 5-325 MG PO TABS
1.0000 | ORAL_TABLET | ORAL | Status: DC | PRN
  Administered 2012-04-24 (×2): 1 via ORAL
  Filled 2012-04-24 (×2): qty 1

## 2012-04-24 MED ORDER — PANTOPRAZOLE SODIUM 40 MG PO TBEC
40.0000 mg | DELAYED_RELEASE_TABLET | Freq: Two times a day (BID) | ORAL | Status: DC
  Administered 2012-04-24 – 2012-04-27 (×5): 40 mg via ORAL
  Filled 2012-04-24 (×6): qty 1

## 2012-04-24 MED ORDER — AMLODIPINE BESYLATE 10 MG PO TABS
10.0000 mg | ORAL_TABLET | Freq: Every day | ORAL | Status: DC
  Administered 2012-04-24 – 2012-04-27 (×4): 10 mg via ORAL
  Filled 2012-04-24 (×4): qty 1

## 2012-04-24 MED ORDER — ISOSORB DINITRATE-HYDRALAZINE 20-37.5 MG PO TABS
1.0000 | ORAL_TABLET | Freq: Two times a day (BID) | ORAL | Status: DC
  Administered 2012-04-24 – 2012-04-25 (×3): 1 via ORAL
  Filled 2012-04-24 (×4): qty 1

## 2012-04-24 MED ORDER — ONDANSETRON HCL 4 MG PO TABS
4.0000 mg | ORAL_TABLET | ORAL | Status: DC
  Administered 2012-04-24 – 2012-04-27 (×10): 4 mg via ORAL
  Filled 2012-04-24 (×27): qty 1

## 2012-04-24 NOTE — Evaluation (Signed)
Physical Therapy Evaluation Patient Details Name: [name] MRN: [id number] DOB: March 14, 1965 Today's Date: 04/24/2012 Time: 0865-7846 PT Time Calculation (min): 26 min  PT Assessment / Plan / Recommendation Clinical Impression  Pt is a pleasent 47 y.o. female who presents with IVH and thalamic hemorhage. Patient not at baseline level of function secondary to fatigue and generalized slow ambulation and processing for mobility.  Anticipate that patient activity will progress towards baseline.  Will need to assess ability to perform stair negotiation before patient returns home. Will continue to see acutely to do so.    PT Assessment  Patient needs continued PT services    Follow Up Recommendations  No PT follow up       Barriers to Discharge None      Equipment Recommendations  None recommended by PT    Recommendations for Other Services     Frequency Min 4X/week    Precautions / Restrictions Restrictions Weight Bearing Restrictions: No   Pertinent Vitals/Pain No pain reported, HR 86 and Spo2 97% rm air prior to activity      Mobility  Bed Mobility Bed Mobility: Rolling Right;Right Sidelying to Sit;Sitting - Scoot to [place] of Bed Rolling Right: 5: Supervision Right Sidelying to Sit: 5: Supervision Sitting - Scoot to Edge of Bed: 5: Supervision Details for Bed Mobility Assistance: Increased time to perform, pt demonstrates good awareness of need to slow and adjust to positional changes, No dizziness reported Transfers Transfers: Sit to Stand;Stand to Sit (performed multiple times (x4) from varying surfaces) Sit to Stand: 5: Supervision Stand to Sit: 5: Supervision Details for Transfer Assistance: Patient again requires increased time to perform, but has no difficulty with activity Ambulation/Gait Ambulation/Gait Assistance: 5: Supervision Ambulation Distance (Feet): 200 Feet Assistive device: None Ambulation/Gait Assistance Details: Patient slow with gait but remains  steady, pt reports easily feeling fatigue with mobility Gait Pattern: Within Functional Limits Gait velocity: decreased General Gait Details: Pt overall steady with ambulation Stairs: No (pt fatigued, will need to perform next session)    Exercises Total Joint Exercises Ankle Circles/Pumps: AROM;Both;20 reps   PT Diagnosis: Difficulty walking;Abnormality of gait  PT Problem List: Decreased activity tolerance;Decreased mobility PT Treatment Interventions: Gait training;Stair training;Functional mobility training;Therapeutic activities;Therapeutic exercise;Patient/family education   PT Goals Acute Rehab PT Goals PT Goal Formulation: With patient Time For Goal Achievement: 05/01/12 Potential to Achieve Goals: Good Pt will Stand: Independently PT Goal: Stand - Progress: Goal set today Pt will Ambulate: Independently PT Goal: Ambulate - Progress: Goal set today Pt will Go Up / Down Stairs: Independently PT Goal: Up/Down Stairs - Progress: Goal set today  Visit Information  Last PT Received On: 04/24/12 Assistance Needed: +1    Subjective Data  Subjective: I just feel really tired Patient Stated Goal: to go home   Prior Functioning  Home Living Lives With: Family Available Help at Discharge: Family Type of Home: House Home Access: Stairs to enter Secretary/administrator of Steps: 4 Entrance Stairs-Rails: None Home Layout: Two level;Able to live on main level with bedroom/bathroom Bathroom Shower/Tub: Engineer, manufacturing systems: Standard Home Adaptive Equipment: Built-in shower seat Additional Comments: no other equipment Prior Function Level of Independence: Independent Able to Take Stairs?: Yes Driving: Yes Communication Communication: No difficulties Dominant Hand: Right    Cognition  Cognition Overall Cognitive Status: Appears within functional limits for tasks assessed/performed Arousal/Alertness: Awake/alert Orientation Level: Appears intact for tasks  assessed;Oriented X4 / Intact Behavior During Session: [facility] for tasks performed Cognition - Other Comments:  Initially lethargic and somewhat flattened affect    Extremity/Trunk Assessment Right Upper Extremity Assessment RUE ROM/Strength/Tone: WFL for tasks assessed Left Upper Extremity Assessment LUE ROM/Strength/Tone: WFL for tasks assessed Right Lower Extremity Assessment RLE ROM/Strength/Tone: Within functional levels RLE Sensation: WFL - Light Touch;WFL - Proprioception RLE Coordination: WFL - gross motor Left Lower Extremity Assessment LLE ROM/Strength/Tone: Within functional levels LLE Sensation: WFL - Light Touch;WFL - Proprioception LLE Coordination: WFL - gross motor   Balance Balance Balance Assessed: Yes Static Standing Balance Static Standing - Balance Support: No upper extremity supported Static Standing - Level of Assistance: 5: Stand by assistance Static Standing - Comment/# of Minutes: 4 minutes (able to perform functional reachning and wt shifts standing) High Level Balance High Level Balance Activites: Side stepping;Backward walking;Direction changes;Turns;Sudden stops;Head turns High Level Balance Comments: overall steady but as pt fatigues has slowness to process.   End of Session PT - End of Session Equipment Utilized During Treatment: Gait belt Activity Tolerance: Patient tolerated treatment well;Patient limited by fatigue Patient left: in chair;with call bell/phone within reach Nurse Communication: Mobility status  GP     Fabio Asa 04/24/2012, 2:36 PM  Charlotte Crumb, PT DPT  [phone]

## 2012-04-24 NOTE — Progress Notes (Signed)
Dr. Frederico Hamman paged at 2300 regarding pt's heartburn. Pt states she has never had heartburn this severe. Maalox 15 mg ordered. Maalox given around 0016. Pt stated that it decreased the heartburn but it was still present. Pt then given 2 mg Zofran with no relief. Dr. Frederico Hamman paged again at 0145 regarding pt's ongoing heartburn. Protonix 40 mg PO ordered to be given. Protonix given at 0159. Will continue to monitor pt. Salvadore Oxford, RN [phone] 04/24/12

## 2012-04-24 NOTE — Progress Notes (Signed)
Stroke Team Progress Note  HISTORY [name] is a 47 y.o. female who was admitted to the hospital 04/21/2012 for diarrhea, nausea and vomiting. In the ER, she was found to have elevated BP with SBP 220 DBP 120. Patient was on coumadin for Factor V Leiden deficiency at home and was found to have a INR of 4.38 on admission. CT of head was obtained due to patient complaining of persistent HA over last 2 months. CT revealed Intraventricular hemorrhage involving the third and fourth ventricles along with possible parenchymal hemorrhage in the corpus callosum just above the third ventricle. Her coumadin was stopped and neurology was consulted for further evaluation of IVH. When seen she was not complaining of a HA or any localizing or lateralizing symptoms. She did have some mild nausea which was well controlled. She was admitted to the neuro ICU for further evaluation and treatment.  SUBJECTIVE There no family members in the room at this time. The patient states that she has an occipital headache which she rates as a 7 on a 1-10 scale. She has some right lateral pleuritic chest discomfort which is not severe. She denies being short of breath. She is occasionally nauseated and eating only small amounts.  OBJECTIVE Most recent Vital Signs: Filed Vitals:   04/23/12 2325 04/24/12 0544 04/24/12 0554 04/24/12 0654  BP:  180/112 191/98 194/103  Pulse: 88 92 68 77  Temp:  97.5 F (36.4 C)    TempSrc:  Oral    Resp: 20 20    Height:      Weight:      SpO2: 98% 96%     CBG (last 3)  No results found for this basename: GLUCAP,  in the last 72 hours  IV Fluid Intake:     MEDICATIONS  . amLODipine  10 mg Oral Daily  . docusate sodium  100 mg Oral BID  . isosorbide-hydrALAZINE  1 tablet Oral BID  . labetalol  100 mg Oral BID  . levothyroxine  175 mcg Oral QAC breakfast  . ondansetron  4 mg Oral Q4H   Or  . ondansetron (ZOFRAN) IV  4 mg Intravenous Q4H  . pantoprazole  40 mg Oral BID  .  PARoxetine  40 mg Oral Daily  . predniSONE  20 mg Oral QAC breakfast  . sodium chloride  3 mL Intravenous Q12H   PRN:  sodium chloride, alum & mag hydroxide-simeth, hydrALAZINE, HYDROcodone-acetaminophen, morphine injection  Diet:  Clear Liquid advance as tolerated. Activity:  Up ad lib DVT Prophylaxis:  SCDs   CLINICALLY SIGNIFICANT STUDIES Basic Metabolic Panel:   Recent Labs Lab 04/22/12 1000 04/23/12 0920  NA 137 141  K 4.4 4.0  CL 104 107  CO2 24 24  GLUCOSE 121* 101*  BUN 23 29*  CREATININE 1.33* 1.59*  CALCIUM 7.7* 8.1*   Liver Function Tests:   Recent Labs Lab 04/21/12 1527  AST 30  ALT 33  ALKPHOS 136*  BILITOT 0.4  PROT 6.7  ALBUMIN 2.8*   CBC:   Recent Labs Lab 04/21/12 1527 04/23/12 0920  WBC 4.1 4.7  NEUTROABS 3.3  --   HGB 14.7 12.2  HCT 43.7 36.7  MCV 83.7 83.8  PLT 96* 99*   Coagulation:   Recent Labs Lab 04/21/12 1856 04/22/12 0630 04/23/12 0920 04/24/12 0535  LABPROT 39.1* 17.2* 12.6 13.2  INR 4.38* 1.44 0.95 1.01   Cardiac Enzymes: No results found for this basename: CKTOTAL, CKMB, CKMBINDEX, TROPONINI,  in the last  168 hours Urinalysis:   Recent Labs Lab 04/21/12 1950  COLORURINE RED*  LABSPEC 1.041*  PHURINE 6.0  GLUCOSEU NEGATIVE  HGBUR LARGE*  BILIRUBINUR LARGE*  KETONESUR 15*  PROTEINUR >300*  UROBILINOGEN 1.0  NITRITE POSITIVE*  LEUKOCYTESUR MODERATE*   Lipid Panel    Component Value Date/Time   CHOL 266* 06/03/2011 1433   TRIG 113.0 06/03/2011 1433   HDL 48.70 06/03/2011 1433   CHOLHDL 5 06/03/2011 1433   VLDL 22.6 06/03/2011 1433   LDLCALC  Value: 192        Total Cholesterol/HDL:CHD Risk Coronary Heart Disease Risk Table                     Men   Women  1/2 Average Risk   3.4   3.3  Average Risk       5.0   4.4  2 X Average Risk   9.6   7.1  3 X Average Risk  23.4   11.0        Use the calculated Patient Ratio above and the CHD Risk Table to determine the patient's CHD Risk.        ATP III CLASSIFICATION  (LDL):  <100     mg/dL   Optimal  161-096  mg/dL   Near or Above                    Optimal  130-159  mg/dL   Borderline  045-409  mg/dL   High  >811     mg/dL   Very High* 10/18/7827 1729   HgbA1C  Lab Results  Component Value Date   HGBA1C 5.7 06/03/2011    Urine Drug Screen:     Component Value Date/Time   LABOPIA NTD 04/18/2009 1254   COCAINSCRNUR NTD 04/18/2009 1254   LABBENZ NTD 04/18/2009 1254   AMPHETMU NTD 04/18/2009 1254   THCU NTD 04/18/2009 1254   LABBARB NTD 04/18/2009 1254    Alcohol Level: No results found for this basename: ETH,  in the last 168 hours  US Renal Port 04/22/2012   No hydronephrosis.  Minimal perinephric fluid noted bilaterally. Etiology indeterminate.   Original Report Authenticated By: Lacy Duverney, M.D.    CT of the brain   04/23/2012  Acute focus of hemorrhage in the left thalamus, third ventricle, and fourth ventricle.  Slight increase in measurement of the thalamic portion of the hemorrhage.  No developing mass effect or ventricular dilatation.    04/22/2012   1.  Intraventricular hemorrhage involving the third and fourth ventricles.  Possible parenchymal hemorrhage in the corpus callosum just above the third ventricle (versus volume averaging with the third ventricular hemorrhage). 2.  No evidence of hydrocephalus. 3.  No midline shift.     MRI of the brain  04/22/2012  1.  The hemorrhage is predominately within the left thalamus with slight extension into the third ventricle.  There is no hydrocephalus. 2.  T2 hyperintensities within the cerebral peduncle and brain stem.  This may be related to the acute event.  Chronic microvascular ischemic changes are also considered. 3.  Right maxillary sinus disease.  MRA of the brain  04/22/2012 Normal variant MRA circle of Willis without evidence for significant proximal stenosis, aneurysm, or branch vessel occlusion.    2D Echocardiogram  not felt to be indicated  Carotid Doppler  not felt to be indicated  EKG  .  Report pending  Therapy Recommendations physical therapy evaluation pending  Physical Exam   General - 47 year old female in bed with flat affect Heart - Regular rate and rhythm - soft systolic murmur. Lungs - Clear to auscultation Abdomen - Soft - non tender Extremities - Distal pulses intact right lower extremity. Weak to absent left lower extremity. Trace edema with stasis changes bilaterally. Skin - Warm and dry  NEUROLOGIC: MENTAL STATUS: awake, alert, language fluent, comprehension intact CRANIAL NERVE: pupils equal and reactive to light, extraocular muscles intact, no nystagmus, facial sensation and strength symmetric, uvula midline, symmetric, tongue midline. MOTOR: normal bulk and tone, full strength in the BUE, BLE SENSORY: normal and symmetric to light touch COORDINATION: finger-nose-finger, fine finger movements normal   ASSESSMENT [name] is a 47 y.o. female presenting with diarrhea, nausea, vomiting, headache in the setting of BP 220/120 and elevated INR of 4.38 on warfarin. 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 Hemorrhage secondary to malignant hypertension.  On warfarin prior to admission. Patient with resultant headache, nausea. Work up underway.   Factor V Leiden on chronic coumadin - INR today 1.01 Hyperlipidemia, not on statin PTA - cholesterol 266 - LDL 192 Malignant Hypertension - blood pressure continues to be elevated. Morbid obesity, Body mass index is 46.59 kg/(m^2). 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 Anxiety disorder  Hospital day # 3  TREATMENT/PLAN  Agree with plans for transfer to the floor - now on 4 N.  SBP goal < 160 - May need to increase Labetalol.  OOB, therapy evals  Consider F/u CT head Sat or Sunday if  indicated  Renal function worsening; mgmt per medicine and nephrology   Delton See PA-C [facility] Pager [phone] 04/24/2012, 9:23 AM  I have personally obtained a history, examined the patient, evaluated imaging results, and formulated the assessment and plan of care. I agree with the above. Keep SBP < 160. No anticoagulation for now. HA and nausea control. Neurologically stable.  Suanne Marker, MD 04/24/2012, 3:03 PM Certified in Neurology, Neurophysiology and Neuroimaging [facility] - Stroke Team  Please refer to [email or web address] for on-call Stroke MD

## 2012-04-24 NOTE — Progress Notes (Signed)
The patient complaining of nausea;   Zofran increased to q4h and maalox ordered.   Obtain Mg in am.

## 2012-04-24 NOTE — Progress Notes (Addendum)
[facility] PROGRESS NOTE  [name] JYN:[id number] DOB: 09/07/1965 DOA: 04/21/2012 PCP: Roxy Manns, MD  Impression/Recommendations:   ICH, presumed hypertensive: - Anticoagulation revesed. - MRI brain 3/20 to evaluate for intraparenchymal hemorrhage  - monitor mental status closely, for hydrocephalus - further management per Neurology. ? Repeat CT head for hydrocephalus.     Hx of PE/  Chronic anticoagulation/  DVT, HX OF/ Factor V Leiden: - reversed anticoagulation. dc'd coumadin.  Hypertensive emergency/AKI: - Pt off nicardipine, now on Norvasc, labetalol, lisinopril. - BP is trending up ans well as creatinine. - Previous Cr. 0.8 2012, d/c lisinopril start Bidil. - Continue hydralzine IV PRN. - Goal SBP <180. -High DS DNA, complement levels low, now steroids. - renal ultrasound: no chronic medical renal disease. - 24hr urine and protein collection.  HYPOTHYROIDISM, POSTABLATION: - continue synthroid.   OSA on CPAP: - c-pap prn.    S/P bariatric surgery: - stable.   nausea and vomiting: - zofran. - UC negative till date, dc'd rocephin. - increase protonix - FOBT stools., HBG is stable.  Other Consultants:  Neurologist  Procedures:  MRI    Subjective: Complaining of abdominal pain  Objective: Filed Vitals:   04/23/12 2325 04/24/12 0544 04/24/12 0554 04/24/12 0654  BP:  180/112 191/98 194/103  Pulse: 88 92 68 77  Temp:  97.5 F (36.4 C)    TempSrc:  Oral    Resp: 20 20    Height:      Weight:      SpO2: 98% 96%       Intake/Output Summary (Last 24 hours) at 04/24/12 0850 Last data filed at 04/23/12 1144  Gross per 24 hour  Intake    120 ml  Output    250 ml  Net   -130 ml    Exam: General: A&O x3, obese Heart: RRR Lung: good air movement CTA B/L Abdomen: NT, ND, soft.  Data Reviewed: Basic Metabolic Panel:  Recent Labs Lab 04/21/12 1527 04/22/12 1000 04/23/12 0920  NA 138 137 141  K 4.2 4.4 4.0  CL 103 104 107  CO2  26 24 24   GLUCOSE 117* 121* 101*  BUN 18 23 29*  CREATININE 1.21* 1.33* 1.59*  CALCIUM 8.4 7.7* 8.1*   Liver Function Tests:  Recent Labs Lab 04/21/12 1527  AST 30  ALT 33  ALKPHOS 136*  BILITOT 0.4  PROT 6.7  ALBUMIN 2.8*    Recent Labs Lab 04/21/12 1527  LIPASE 23   No results found for this basename: AMMONIA,  in the last 168 hours CBC:  Recent Labs Lab 04/21/12 1527 04/23/12 0920  WBC 4.1 4.7  NEUTROABS 3.3  --   HGB 14.7 12.2  HCT 43.7 36.7  MCV 83.7 83.8  PLT 96* 99*   Cardiac Enzymes: No results found for this basename: CKTOTAL, CKMB, CKMBINDEX, TROPONINI,  in the last 168 hours BNP: No components found with this basename: POCBNP,  CBG: No results found for this basename: GLUCAP,  in the last 168 hours  Recent Results (from the past 240 hour(s))  URINE CULTURE     Status: None   Collection Time    04/16/12  9:31 AM      Result Value Range Status   Colony Count 75,000 COLONIES/ML   Final   Organism ID, Bacteria Multiple bacterial morphotypes present, none   Final   Organism ID, Bacteria predominant. Suggest appropriate recollection if    Final   Organism ID, Bacteria clinically indicated.  Final  URINE CULTURE     Status: None   Collection Time    04/21/12  7:50 PM      Result Value Range Status   Specimen Description URINE, RANDOM   Final   Special Requests NONE   Final   Culture  Setup Time 04/22/2012 02:42   Final   Colony Count NO GROWTH   Final   Culture NO GROWTH   Final   Report Status 04/23/2012 FINAL   Final  MRSA PCR SCREENING     Status: None   Collection Time    04/22/12  3:57 AM      Result Value Range Status   MRSA by PCR NEGATIVE  NEGATIVE Final   Comment:            The GeneXpert MRSA Assay (FDA     approved for NASAL specimens     only), is one component of a     comprehensive MRSA colonization     surveillance program. It is not     intended to diagnose MRSA     infection nor to guide or     monitor treatment for      MRSA infections.     Studies: Ct Head Wo Contrast  04/23/2012  *RADIOLOGY REPORT*  Clinical Data: Follow-up intraventricular hemorrhage.  CT HEAD WITHOUT CONTRAST  Technique:  Contiguous axial images were obtained from the base of the skull through the vertex without contrast.  Comparison: MRI brain 04/22/2012.  CT head 04/22/2012.  Findings: Focal acute hemorrhage in the left thalamus and third ventricle is again demonstrated.  Largest portion of the hemorrhage measures about 9 mm diameter, slightly increased from 7.6 mm previously. This may be within measurement error.  Tiny focus of hemorrhage again demonstrated fourth ventricle, stable.  No new hemorrhage is identified.  The ventricles are not dilated.  No mass effect or midline shift.  IMPRESSION: Acute focus of hemorrhage in the left thalamus, third ventricle, and fourth ventricle.  Slight increase in measurement of the thalamic portion of the hemorrhage.  No developing mass effect or ventricular dilatation.   Original Report Authenticated By: Burman Nieves, M.D.    Ct Head Wo Contrast  04/22/2012  *RADIOLOGY REPORT*  Clinical Data: Headache.  Nausea.  Recent norovirus infection.  CT HEAD WITHOUT CONTRAST  Technique:  Contiguous axial images were obtained from the base of the skull through the vertex without contrast.  Comparison: CT head 04/06/2008, 10/28/2004.  MRI brain 10/28/2004.  Findings: Acute intraventricular hemorrhage involving the third and fourth ventricles.  No evidence of hydrocephalus.  No midline shift.  Volume averaging with the hemorrhage in the third ventricle versus a small focus of parenchymal hemorrhage in the corpus callosum immediately above the third ventricle (image 15).  No acute hemorrhage or hematoma elsewhere.  No extra-axial fluid collections.  Mucosal thickening involving the right maxillary sinus.  Remaining visualized paranasal sinuses, bilateral mastoid air cells, and bilateral middle ear cavities well-aerated.   Extensive bilateral carotid siphon vertebral artery atherosclerosis for age.  IMPRESSION:  1.  Intraventricular hemorrhage involving the third and fourth ventricles.  Possible parenchymal hemorrhage in the corpus callosum just above the third ventricle (versus volume averaging with the third ventricular hemorrhage). 2.  No evidence of hydrocephalus. 3.  No midline shift.  Non-emergent MRI of the brain without with contrast is recommended in further evaluation.  Critical Value/emergent results were called by telephone at the time of interpretation on 04/22/2012 at 0130 hours to [facility]  Muthersbaugh, PA, of the emergency department, who verbally acknowledged these results.   Original Report Authenticated By: Hulan Saas, M.D.    Mr [facility] Wo Contrast  04/22/2012  *RADIOLOGY REPORT*  Clinical Data:  Interventricular hemorrhage.  Parenchymal hemorrhage.  MRI HEAD WITHOUT AND WITH CONTRAST MRA HEAD WITHOUT CONTRAST  Technique:  Multiplanar, multiecho pulse sequences of the brain and surrounding structures were obtained without and with intravenous contrast.  Angiographic images of the head were obtained using MRA technique without contrast.  Contrast: 20mL MULTIHANCE GADOBENATE DIMEGLUMINE 529 MG/ML IV SOLN  Comparison:  CT head without contrast 04/22/2012.  MRI HEAD WITHOUT AND WITH CONTRAST  Findings:  The medial left thalamic hemorrhage extends into the third ventricle the majority of this hemorrhage is in the thalamus. Minimal layering blood products are noted in the posterior horn of the right left lateral ventricle.  None are evident on the right. No additional hemorrhages present.  Mild periventricular and scattered subcortical T2 hyperintensities are slightly greater than expected for age.  There is no associated pathologic enhancement.  No vascular lesions are identified.  No underlying mass lesion is present.  The postcontrast images demonstrate no pathologic enhancement.  Flow is present in the major  intracranial arteries.  The globes and orbits are intact.  Mild circumferential mucosal thickening is present in the maxillary sinuses bilaterally, right greater than left.  The paranasal sinuses and mastoid air cells are otherwise clear.  IMPRESSION:  1.  The hemorrhage is predominately within the left thalamus with slight extension into the third ventricle.  There is no hydrocephalus. 2.  T2 hyperintensities within the cerebral peduncle and brain stem.  This may be related to the acute event.  Chronic microvascular ischemic changes are also considered. 3.  Right maxillary sinus disease.  MRA HEAD  Findings: The internal carotid arteries are within normal limits from high cervical segments through the ICA termini.  The A1 and M1 segments are normal.  ACA and MCA branch vessels are within normal limits.  No significant vascular anomaly is associated with the hemorrhage.  The vertebral arteries are codominant.  The right a AICA is dominant.  The basilar artery is normal.  Both posterior cerebral arteries originate from basilar tip.  The PCA branch vessels are unremarkable.  IMPRESSION: Normal variant MRA circle of Willis without evidence for significant proximal stenosis, aneurysm, or branch vessel occlusion.   Original Report Authenticated By: Marin Roberts, M.D.    Mr Laqueta Jean ZO Contrast  04/22/2012  *RADIOLOGY REPORT*  Clinical Data:  Interventricular hemorrhage.  Parenchymal hemorrhage.  MRI HEAD WITHOUT AND WITH CONTRAST MRA HEAD WITHOUT CONTRAST  Technique:  Multiplanar, multiecho pulse sequences of the brain and surrounding structures were obtained without and with intravenous contrast.  Angiographic images of the head were obtained using MRA technique without contrast.  Contrast: 20mL MULTIHANCE GADOBENATE DIMEGLUMINE 529 MG/ML IV SOLN  Comparison:  CT head without contrast 04/22/2012.  MRI HEAD WITHOUT AND WITH CONTRAST  Findings:  The medial left thalamic hemorrhage extends into the third ventricle  the majority of this hemorrhage is in the thalamus. Minimal layering blood products are noted in the posterior horn of the right left lateral ventricle.  None are evident on the right. No additional hemorrhages present.  Mild periventricular and scattered subcortical T2 hyperintensities are slightly greater than expected for age.  There is no associated pathologic enhancement.  No vascular lesions are identified.  No underlying mass lesion is present.  The postcontrast images demonstrate no  pathologic enhancement.  Flow is present in the major intracranial arteries.  The globes and orbits are intact.  Mild circumferential mucosal thickening is present in the maxillary sinuses bilaterally, right greater than left.  The paranasal sinuses and mastoid air cells are otherwise clear.  IMPRESSION:  1.  The hemorrhage is predominately within the left thalamus with slight extension into the third ventricle.  There is no hydrocephalus. 2.  T2 hyperintensities within the cerebral peduncle and brain stem.  This may be related to the acute event.  Chronic microvascular ischemic changes are also considered. 3.  Right maxillary sinus disease.  MRA HEAD  Findings: The internal carotid arteries are within normal limits from high cervical segments through the ICA termini.  The A1 and M1 segments are normal.  ACA and MCA branch vessels are within normal limits.  No significant vascular anomaly is associated with the hemorrhage.  The vertebral arteries are codominant.  The right a AICA is dominant.  The basilar artery is normal.  Both posterior cerebral arteries originate from basilar tip.  The PCA branch vessels are unremarkable.  IMPRESSION: Normal variant MRA circle of Willis without evidence for significant proximal stenosis, aneurysm, or branch vessel occlusion.   Original Report Authenticated By: Marin Roberts, M.D.    US Renal Port  04/22/2012  *RADIOLOGY REPORT*  Clinical Data: Elevated creatinine.  Nephritis.   RENAL/URINARY TRACT ULTRASOUND COMPLETE  Comparison:  09/06/2004.  Findings:  Right Kidney:  12.9 cm. No hydronephrosis or renal mass.  Minimal perinephric fluid.  Left Kidney:  13.9 cm.  No hydronephrosis or renal mass.  Minimal perinephric fluid.  Bladder:  Decompressed by Foley catheter.  IMPRESSION: No hydronephrosis.  Minimal perinephric fluid noted bilaterally. Etiology indeterminate.   Original Report Authenticated By: Lacy Duverney, M.D.     Scheduled Meds: . amLODipine  5 mg Oral Daily  . docusate sodium  100 mg Oral BID  . labetalol  100 mg Oral BID  . levothyroxine  175 mcg Oral QAC breakfast  . lisinopril  20 mg Oral Daily  . ondansetron  4 mg Oral Q4H   Or  . ondansetron (ZOFRAN) IV  4 mg Intravenous Q4H  . pantoprazole  40 mg Oral Daily  . PARoxetine  40 mg Oral Daily  . predniSONE  20 mg Oral QAC breakfast  . sodium chloride  3 mL Intravenous Q12H   Continuous Infusions:    Code Status: full Family Communication: none Disposition Plan: home 3-4 days  Marinda Elk [facility] Pager [phone]  If 7PM-7AM, please contact night-coverage [email or web address] password [facility] 04/24/2012, 8:50 AM   LOS: 3 days

## 2012-04-25 LAB — BASIC METABOLIC PANEL
Calcium: 8.3 mg/dL — ABNORMAL LOW (ref 8.4–10.5)
GFR calc Af Amer: 24 mL/min — ABNORMAL LOW (ref >90)
GFR calc non Af Amer: 21 mL/min — ABNORMAL LOW (ref >90)
Glucose, Bld: 93 mg/dL (ref 70–99)
Sodium: 143 mEq/L (ref 135–145)

## 2012-04-25 MED ORDER — PROMETHAZINE HCL 25 MG/ML IJ SOLN
25.0000 mg | Freq: Three times a day (TID) | INTRAMUSCULAR | Status: DC | PRN
  Administered 2012-04-25: 25 mg via INTRAVENOUS
  Filled 2012-04-25 (×2): qty 1

## 2012-04-25 MED ORDER — SODIUM CHLORIDE 0.9 % IV SOLN: 250.0000 mL | INTRAVENOUS | Status: DC | PRN

## 2012-04-25 MED ORDER — ISOSORB DINITRATE-HYDRALAZINE 20-37.5 MG PO TABS: 1.0000 | ORAL_TABLET | Freq: Two times a day (BID) | ORAL | Status: DC

## 2012-04-25 MED ORDER — SODIUM CHLORIDE 0.9 % IV BOLUS (SEPSIS)
500.0000 mL | Freq: Once | INTRAVENOUS | Status: AC
  Administered 2012-04-25: 500 mL via INTRAVENOUS

## 2012-04-25 MED ORDER — SODIUM CHLORIDE 0.9 % IV SOLN: 250.0000 mL | INTRAVENOUS | Status: AC | PRN

## 2012-04-25 NOTE — Progress Notes (Signed)
Pt places self on/off cpap/ 

## 2012-04-25 NOTE — Progress Notes (Signed)
PT Cancellation Note  Patient Details Name: [name] MRN: [id number] DOB: Feb 14, 1965   Cancelled Treatment:    Reason Eval/Treat Not Completed due to pt c/o nausea.  Pt states RN is aware.    Verdell Face, [place] [phone] 04/25/2012

## 2012-04-25 NOTE — Progress Notes (Signed)
Patient informed of the need to collect 24H urine specimen. . Patient said she just voided at the start of the shift and does not feel the need to urinate at this time. Patient also said that she has not been drinking much but will try to use the bathroom as soon as she wakes up. Will inform next shift RN. Will continue to monitor.  KYoung RN

## 2012-04-25 NOTE — Progress Notes (Signed)
24 hr urine started this am at 0800  Minor, [name]

## 2012-04-25 NOTE — Progress Notes (Signed)
[facility] PROGRESS NOTE  [name] AVW:[id number] DOB: Nov 24, 1965 DOA: 04/21/2012 PCP: Roxy Manns, MD  Impression/Recommendations: Hypertensive emergency/AKI: - Pt off nicardipine, now on Norvasc, labetalol, Bidil. - BP is improving - Previous Cr. 0.8 2012. Basic met pending. - 24hr urine and protein collection.  - Goal SBP <180. -High DS DNA, complement levels low, now steroids. - renal ultrasound: no chronic medical renal disease.   ICH, presumed hypertensive: - Anticoagulation reversed. - MRI brain 3/20 to evaluate for intraparenchymal hemorrhage  - monitor mental status closely, for hydrocephalus - further management per Neurology. ? Repeat CT head for hydrocephalus.     Hx of PE/  Chronic anticoagulation/  DVT, HX OF/ Factor V Leiden: - reversed anticoagulation. dc'd coumadin.  HYPOTHYROIDISM, POSTABLATION: - continue synthroid.   OSA on CPAP: - c-pap prn.    S/P bariatric surgery: - stable.   nausea and vomiting: - zofran. - UC negative till date, dc'd rocephin. - increase protonix - FOBT stools., HBG is stable.  Other Consultants:  Neurologist  Procedures:  MRI    Subjective: No complains  Objective: Filed Vitals:   04/24/12 1447 04/24/12 1716 04/24/12 2111 04/25/12 0525  BP: 133/67 142/73 139/72 142/65  Pulse: 83 77 86 85  Temp: 97.7 F (36.5 C) 98.1 F (36.7 C) 98.5 F (36.9 C) 97.7 F (36.5 C)  TempSrc:   Oral Oral  Resp: 20 20 18 20   Height:      Weight:      SpO2: 97% 95% 100% 95%     Intake/Output Summary (Last 24 hours) at 04/25/12 0829 Last data filed at 04/25/12 0600  Gross per 24 hour  Intake    120 ml  Output      0 ml  Net    120 ml    Exam: General: A&O x3, obese Heart: RRR Lung: good air movement CTA B/L Abdomen: NT, ND, soft.  Data Reviewed: Basic Metabolic Panel:  Recent Labs Lab 04/21/12 1527 04/22/12 1000 04/23/12 0920 04/25/12 0700  NA 138 137 141  --   K 4.2 4.4 4.0  --   CL 103 104  107  --   CO2 26 24 24   --   GLUCOSE 117* 121* 101*  --   BUN 18 23 29*  --   CREATININE 1.21* 1.33* 1.59*  --   CALCIUM 8.4 7.7* 8.1*  --   MG  --   --   --  2.3   Liver Function Tests:  Recent Labs Lab 04/21/12 1527  AST 30  ALT 33  ALKPHOS 136*  BILITOT 0.4  PROT 6.7  ALBUMIN 2.8*    Recent Labs Lab 04/21/12 1527  LIPASE 23   No results found for this basename: AMMONIA,  in the last 168 hours CBC:  Recent Labs Lab 04/21/12 1527 04/23/12 0920  WBC 4.1 4.7  NEUTROABS 3.3  --   HGB 14.7 12.2  HCT 43.7 36.7  MCV 83.7 83.8  PLT 96* 99*   Cardiac Enzymes:  Recent Labs Lab 04/24/12 1200  CKTOTAL 20   BNP: No components found with this basename: POCBNP,  CBG: No results found for this basename: GLUCAP,  in the last 168 hours  Recent Results (from the past 240 hour(s))  URINE CULTURE     Status: None   Collection Time    04/16/12  9:31 AM      Result Value Range Status   Colony Count 75,000 COLONIES/ML   Final   Organism ID,  Bacteria Multiple bacterial morphotypes present, none   Final   Organism ID, Bacteria predominant. Suggest appropriate recollection if    Final   Organism ID, Bacteria clinically indicated.   Final  URINE CULTURE     Status: None   Collection Time    04/21/12  7:50 PM      Result Value Range Status   Specimen Description URINE, RANDOM   Final   Special Requests NONE   Final   Culture  Setup Time 04/22/2012 02:42   Final   Colony Count NO GROWTH   Final   Culture NO GROWTH   Final   Report Status 04/23/2012 FINAL   Final  MRSA PCR SCREENING     Status: None   Collection Time    04/22/12  3:57 AM      Result Value Range Status   MRSA by PCR NEGATIVE  NEGATIVE Final   Comment:            The GeneXpert MRSA Assay (FDA     approved for NASAL specimens     only), is one component of a     comprehensive MRSA colonization     surveillance program. It is not     intended to diagnose MRSA     infection nor to guide or      monitor treatment for     MRSA infections.     Studies: Ct Head Wo Contrast  04/23/2012  *RADIOLOGY REPORT*  Clinical Data: Follow-up intraventricular hemorrhage.  CT HEAD WITHOUT CONTRAST  Technique:  Contiguous axial images were obtained from the base of the skull through the vertex without contrast.  Comparison: MRI brain 04/22/2012.  CT head 04/22/2012.  Findings: Focal acute hemorrhage in the left thalamus and third ventricle is again demonstrated.  Largest portion of the hemorrhage measures about 9 mm diameter, slightly increased from 7.6 mm previously. This may be within measurement error.  Tiny focus of hemorrhage again demonstrated fourth ventricle, stable.  No new hemorrhage is identified.  The ventricles are not dilated.  No mass effect or midline shift.  IMPRESSION: Acute focus of hemorrhage in the left thalamus, third ventricle, and fourth ventricle.  Slight increase in measurement of the thalamic portion of the hemorrhage.  No developing mass effect or ventricular dilatation.   Original Report Authenticated By: Burman Nieves, M.D.    Ct Head Wo Contrast  04/22/2012  *RADIOLOGY REPORT*  Clinical Data: Headache.  Nausea.  Recent norovirus infection.  CT HEAD WITHOUT CONTRAST  Technique:  Contiguous axial images were obtained from the base of the skull through the vertex without contrast.  Comparison: CT head 04/06/2008, 10/28/2004.  MRI brain 10/28/2004.  Findings: Acute intraventricular hemorrhage involving the third and fourth ventricles.  No evidence of hydrocephalus.  No midline shift.  Volume averaging with the hemorrhage in the third ventricle versus a small focus of parenchymal hemorrhage in the corpus callosum immediately above the third ventricle (image 15).  No acute hemorrhage or hematoma elsewhere.  No extra-axial fluid collections.  Mucosal thickening involving the right maxillary sinus.  Remaining visualized paranasal sinuses, bilateral mastoid air cells, and bilateral middle ear  cavities well-aerated.  Extensive bilateral carotid siphon vertebral artery atherosclerosis for age.  IMPRESSION:  1.  Intraventricular hemorrhage involving the third and fourth ventricles.  Possible parenchymal hemorrhage in the corpus callosum just above the third ventricle (versus volume averaging with the third ventricular hemorrhage). 2.  No evidence of hydrocephalus. 3.  No midline shift.  Non-emergent  MRI of the brain without with contrast is recommended in further evaluation.  Critical Value/emergent results were called by telephone at the time of interpretation on 04/22/2012 at 0130 hours to [facility] [place], of the emergency department, who verbally acknowledged these results.   Original Report Authenticated By: Hulan Saas, M.D.    [facility]  04/22/2012  *RADIOLOGY REPORT*  Clinical Data:  Interventricular hemorrhage.  Parenchymal hemorrhage.  MRI HEAD WITHOUT AND WITH CONTRAST MRA HEAD WITHOUT CONTRAST  Technique:  Multiplanar, multiecho pulse sequences of the brain and surrounding structures were obtained without and with intravenous contrast.  Angiographic images of the head were obtained using MRA technique without contrast.  Contrast: 20mL MULTIHANCE GADOBENATE DIMEGLUMINE 529 MG/ML IV SOLN  Comparison:  CT head without contrast 04/22/2012.  MRI HEAD WITHOUT AND WITH CONTRAST  Findings:  The medial left thalamic hemorrhage extends into the third ventricle the majority of this hemorrhage is in the thalamus. Minimal layering blood products are noted in the posterior horn of the right left lateral ventricle.  None are evident on the right. No additional hemorrhages present.  Mild periventricular and scattered subcortical T2 hyperintensities are slightly greater than expected for age.  There is no associated pathologic enhancement.  No vascular lesions are identified.  No underlying mass lesion is present.  The postcontrast images demonstrate no pathologic enhancement.  Flow  is present in the major intracranial arteries.  The globes and orbits are intact.  Mild circumferential mucosal thickening is present in the maxillary sinuses bilaterally, right greater than left.  The paranasal sinuses and mastoid air cells are otherwise clear.  IMPRESSION:  1.  The hemorrhage is predominately within the left thalamus with slight extension into the third ventricle.  There is no hydrocephalus. 2.  T2 hyperintensities within the cerebral peduncle and brain stem.  This may be related to the acute event.  Chronic microvascular ischemic changes are also considered. 3.  Right maxillary sinus disease.  MRA HEAD  Findings: The internal carotid arteries are within normal limits from high cervical segments through the ICA termini.  The A1 and M1 segments are normal.  ACA and MCA branch vessels are within normal limits.  No significant vascular anomaly is associated with the hemorrhage.  The vertebral arteries are codominant.  The right a AICA is dominant.  The basilar artery is normal.  Both posterior cerebral arteries originate from basilar tip.  The PCA branch vessels are unremarkable.  IMPRESSION: Normal variant MRA circle of Willis without evidence for significant proximal stenosis, aneurysm, or branch vessel occlusion.   Original Report Authenticated By: Marin Roberts, M.D.    Mr [facility] ZO Contrast  04/22/2012  *RADIOLOGY REPORT*  Clinical Data:  Interventricular hemorrhage.  Parenchymal hemorrhage.  MRI HEAD WITHOUT AND WITH CONTRAST MRA HEAD WITHOUT CONTRAST  Technique:  Multiplanar, multiecho pulse sequences of the brain and surrounding structures were obtained without and with intravenous contrast.  Angiographic images of the head were obtained using MRA technique without contrast.  Contrast: 20mL MULTIHANCE GADOBENATE DIMEGLUMINE 529 MG/ML IV SOLN  Comparison:  CT head without contrast 04/22/2012.  MRI HEAD WITHOUT AND WITH CONTRAST  Findings:  The medial left thalamic hemorrhage extends  into the third ventricle the majority of this hemorrhage is in the thalamus. Minimal layering blood products are noted in the posterior horn of the right left lateral ventricle.  None are evident on the right. No additional hemorrhages present.  Mild periventricular and scattered subcortical T2 hyperintensities are  slightly greater than expected for age.  There is no associated pathologic enhancement.  No vascular lesions are identified.  No underlying mass lesion is present.  The postcontrast images demonstrate no pathologic enhancement.  Flow is present in the major intracranial arteries.  The globes and orbits are intact.  Mild circumferential mucosal thickening is present in the maxillary sinuses bilaterally, right greater than left.  The paranasal sinuses and mastoid air cells are otherwise clear.  IMPRESSION:  1.  The hemorrhage is predominately within the left thalamus with slight extension into the third ventricle.  There is no hydrocephalus. 2.  T2 hyperintensities within the cerebral peduncle and brain stem.  This may be related to the acute event.  Chronic microvascular ischemic changes are also considered. 3.  Right maxillary sinus disease.  MRA HEAD  Findings: The internal carotid arteries are within normal limits from high cervical segments through the ICA termini.  The A1 and M1 segments are normal.  ACA and MCA branch vessels are within normal limits.  No significant vascular anomaly is associated with the hemorrhage.  The vertebral arteries are codominant.  The right a AICA is dominant.  The basilar artery is normal.  Both posterior cerebral arteries originate from basilar tip.  The PCA branch vessels are unremarkable.  IMPRESSION: Normal variant MRA circle of Willis without evidence for significant proximal stenosis, aneurysm, or branch vessel occlusion.   Original Report Authenticated By: Marin Roberts, M.D.    US Renal Port  04/22/2012  *RADIOLOGY REPORT*  Clinical Data: Elevated  creatinine.  Nephritis.  RENAL/URINARY TRACT ULTRASOUND COMPLETE  Comparison:  09/06/2004.  Findings:  Right Kidney:  12.9 cm. No hydronephrosis or renal mass.  Minimal perinephric fluid.  Left Kidney:  13.9 cm.  No hydronephrosis or renal mass.  Minimal perinephric fluid.  Bladder:  Decompressed by Foley catheter.  IMPRESSION: No hydronephrosis.  Minimal perinephric fluid noted bilaterally. Etiology indeterminate.   Original Report Authenticated By: Lacy Duverney, M.D.     Scheduled Meds: . amLODipine  10 mg Oral Daily  . docusate sodium  100 mg Oral BID  . isosorbide-hydrALAZINE  1 tablet Oral BID  . labetalol  100 mg Oral BID  . levothyroxine  175 mcg Oral QAC breakfast  . ondansetron  4 mg Oral Q4H   Or  . ondansetron (ZOFRAN) IV  4 mg Intravenous Q4H  . pantoprazole  40 mg Oral BID  . PARoxetine  40 mg Oral Daily  . sodium chloride  3 mL Intravenous Q12H   Continuous Infusions:    Code Status: full Family Communication: none Disposition Plan: home 3-4 days  Marinda Elk [facility] Pager [phone]  If 7PM-7AM, please contact night-coverage [email or web address] password [facility] 04/25/2012, 8:29 AM   LOS: 4 days

## 2012-04-25 NOTE — Progress Notes (Signed)
Stroke Team Progress Note  HISTORY [name] is a 47 y.o. female who was admitted to the hospital 04/21/2012 for diarrhea, nausea and vomiting. In the ER, she was found to have elevated BP with SBP 220 DBP 120. Patient was on coumadin for Factor V Leiden deficiency at home and was found to have a INR of 4.38 on admission. CT of head was obtained due to patient complaining of persistent HA over last 2 months. CT revealed Intraventricular hemorrhage involving the third and fourth ventricles along with possible parenchymal hemorrhage in the corpus callosum just above the third ventricle. Her coumadin was stopped and neurology was consulted for further evaluation of IVH. When seen she was not complaining of a HA or any localizing or lateralizing symptoms. She did have some mild nausea which was well controlled. She was admitted to the neuro ICU for further evaluation and treatment.  SUBJECTIVE There is a family member in the room this morning as well as the nurse. The patient reports that she does not feel at all well today. She describes a nauseated full feeling in her abdomen radiating into her chest. She denies shortness of breath. The right-sided pleuritic discomfort from yesterday has resolved. An EKG is to be performed now since it appears that there has been no EKG this admission. The patient will also receive Zofran for her nausea. Her blood pressure is been running somewhat low today and she describes feeling dizzy. Her BUN and creatinine are elevated and she was probably dehydrated.  OBJECTIVE Most recent Vital Signs: Filed Vitals:   04/24/12 1716 04/24/12 2111 04/25/12 0525 04/25/12 1056  BP: 142/73 139/72 142/65 102/33  Pulse: 77 86 85 79  Temp: 98.1 F (36.7 C) 98.5 F (36.9 C) 97.7 F (36.5 C) 97.7 F (36.5 C)  TempSrc:  Oral Oral   Resp: 20 18 20 20   Height:      Weight:      SpO2: 95% 100% 95% 97%   CBG (last 3)  No results found for this basename: GLUCAP,  in the last 72  hours  IV Fluid Intake:     MEDICATIONS  . amLODipine  10 mg Oral Daily  . docusate sodium  100 mg Oral BID  . labetalol  100 mg Oral BID  . levothyroxine  175 mcg Oral QAC breakfast  . ondansetron  4 mg Oral Q4H   Or  . ondansetron (ZOFRAN) IV  4 mg Intravenous Q4H  . pantoprazole  40 mg Oral BID  . PARoxetine  40 mg Oral Daily  . sodium chloride  500 mL Intravenous Once  . sodium chloride  3 mL Intravenous Q12H   PRN:  sodium chloride, alum & mag hydroxide-simeth, HYDROcodone-acetaminophen, morphine injection, promethazine  Diet:  Clear Liquid advance as tolerated. Activity:  Up ad lib DVT Prophylaxis:  SCDs   CLINICALLY SIGNIFICANT STUDIES Basic Metabolic Panel:   Recent Labs Lab 04/23/12 0920 04/25/12 0700 04/25/12 0927  NA 141  --  143  K 4.0  --  4.2  CL 107  --  108  CO2 24  --  25  GLUCOSE 101*  --  93  BUN 29*  --  46*  CREATININE 1.59*  --  2.60*  CALCIUM 8.1*  --  8.3*  MG  --  2.3  --    Liver Function Tests:   Recent Labs Lab 04/21/12 1527  AST 30  ALT 33  ALKPHOS 136*  BILITOT 0.4  PROT 6.7  ALBUMIN  2.8*   CBC:   Recent Labs Lab 04/21/12 1527 04/23/12 0920  WBC 4.1 4.7  NEUTROABS 3.3  --   HGB 14.7 12.2  HCT 43.7 36.7  MCV 83.7 83.8  PLT 96* 99*   Coagulation:   Recent Labs Lab 04/21/12 1856 04/22/12 0630 04/23/12 0920 04/24/12 0535  LABPROT 39.1* 17.2* 12.6 13.2  INR 4.38* 1.44 0.95 1.01   Cardiac Enzymes:   Recent Labs Lab 04/24/12 1200  CKTOTAL 20   Urinalysis:   Recent Labs Lab 04/21/12 1950  COLORURINE RED*  LABSPEC 1.041*  PHURINE 6.0  GLUCOSEU NEGATIVE  HGBUR LARGE*  BILIRUBINUR LARGE*  KETONESUR 15*  PROTEINUR >300*  UROBILINOGEN 1.0  NITRITE POSITIVE*  LEUKOCYTESUR MODERATE*   Lipid Panel    Component Value Date/Time   CHOL 266* 06/03/2011 1433   TRIG 113.0 06/03/2011 1433   HDL 48.70 06/03/2011 1433   CHOLHDL 5 06/03/2011 1433   VLDL 22.6 06/03/2011 1433   LDLCALC  Value: 192        Total  Cholesterol/HDL:CHD Risk Coronary Heart Disease Risk Table                     Men   Women  1/2 Average Risk   3.4   3.3  Average Risk       5.0   4.4  2 X Average Risk   9.6   7.1  3 X Average Risk  23.4   11.0        Use the calculated Patient Ratio above and the CHD Risk Table to determine the patient's CHD Risk.        ATP III CLASSIFICATION (LDL):  <100     mg/dL   Optimal  960-454  mg/dL   Near or Above                    Optimal  130-159  mg/dL   Borderline  098-119  mg/dL   High  >147     mg/dL   Very High* 10/02/5619 1729   HgbA1C  Lab Results  Component Value Date   HGBA1C 5.7 06/03/2011    Urine Drug Screen:     Component Value Date/Time   LABOPIA NTD 04/18/2009 1254   COCAINSCRNUR NTD 04/18/2009 1254   LABBENZ NTD 04/18/2009 1254   AMPHETMU NTD 04/18/2009 1254   THCU NTD 04/18/2009 1254   LABBARB NTD 04/18/2009 1254    Alcohol Level: No results found for this basename: ETH,  in the last 168 hours  US Renal Port 04/22/2012   No hydronephrosis.  Minimal perinephric fluid noted bilaterally. Etiology indeterminate.   Original Report Authenticated By: Lacy Duverney, M.D.    CT of the brain   04/23/2012  Acute focus of hemorrhage in the left thalamus, third ventricle, and fourth ventricle.  Slight increase in measurement of the thalamic portion of the hemorrhage.  No developing mass effect or ventricular dilatation.    04/22/2012   1.  Intraventricular hemorrhage involving the third and fourth ventricles.  Possible parenchymal hemorrhage in the corpus callosum just above the third ventricle (versus volume averaging with the third ventricular hemorrhage). 2.  No evidence of hydrocephalus. 3.  No midline shift.     MRI of the brain  04/22/2012  1.  The hemorrhage is predominately within the left thalamus with slight extension into the third ventricle.  There is no hydrocephalus. 2.  T2 hyperintensities within the cerebral peduncle and brain stem.  This may be related to the acute event.  Chronic  microvascular ischemic changes are also considered. 3.  Right maxillary sinus disease.  MRA of the brain  04/22/2012 Normal variant MRA circle of Willis without evidence for significant proximal stenosis, aneurysm, or branch vessel occlusion.    2D Echocardiogram  not felt to be indicated  Carotid Doppler  not felt to be indicated  EKG  ? Not ordered - will ask nursing to check.  Therapy Recommendations  - continued physical therapy recommended, but no follow up physical therapy.  Physical Exam   General - 47 year old female in bed with eyes closed but awake. Heart - Regular rate and rhythm - soft systolic murmur. Lungs - Clear to auscultation anteriorly. Abdomen - Soft - non tender Extremities - Distal pulses intact right lower extremity. Weak to absent left lower extremity. Trace edema with stasis changes bilaterally. Skin - Warm and dry  NEUROLOGIC: MENTAL STATUS: awake, alert, language fluent, comprehension intact CRANIAL NERVE: pupils equal and reactive to light, extraocular muscles intact, no nystagmus, facial sensation and strength symmetric, uvula midline, symmetric, tongue midline. MOTOR: normal bulk and tone, full strength in the BUE, BLE SENSORY: normal and symmetric to light touch COORDINATION: finger-nose-finger, fine finger movements normal   ASSESSMENT [name] is a 47 y.o. female presenting with diarrhea, nausea, vomiting, headache in the setting of BP 220/120 and elevated INR of 4.38 on warfarin. 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 Hemorrhage secondary to malignant hypertension.  On warfarin prior to admission. Patient with resultant headache, nausea. Work up underway.   Factor V Leiden on chronic coumadin - INR today 1.01 Hyperlipidemia, not on statin PTA - cholesterol 266 - LDL 192 - consider adding statin when nausea resolves. Malignant Hypertension - blood pressure better today. Morbid obesity, Body mass index is 46.59  kg/(m^2). 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 Anxiety disorder History of congestive heart failure per patient history.  Hospital day # 4  TREATMENT/PLAN  Agree with plans for transfer to the floor - now on 4 N.  SBP goal < 160   OOB, therapy evals  Consider F/u CT head Sat or Sunday if indicated  Renal function worsening; mgmt per medicine and nephrology   Delton See PA-C [facility] Pager [phone] 04/25/2012, 11:34 AM  I have personally obtained a history, examined the patient, evaluated imaging results, and formulated the assessment and plan of care. I agree with the above. Keep SBP < 160. No anticoagulation for now. HA and nausea control. Neurologically stable, but having generalized malaise. Kidney function deteriorating (mgmt per primary team).  Suanne Marker, MD 04/25/2012, 11:34 AM Certified in Neurology, Neurophysiology and Neuroimaging [facility] - Stroke Team  Please refer to [email or web address] for on-call Stroke MD

## 2012-04-26 DIAGNOSIS — I619 Nontraumatic intracerebral hemorrhage, unspecified: Secondary | ICD-10-CM

## 2012-04-26 DIAGNOSIS — N179 Acute kidney failure, unspecified: Secondary | ICD-10-CM

## 2012-04-26 DIAGNOSIS — I10 Essential (primary) hypertension: Secondary | ICD-10-CM

## 2012-04-26 LAB — BASIC METABOLIC PANEL
BUN: 37 mg/dL — ABNORMAL HIGH (ref 6–23)
Chloride: 110 mEq/L (ref 96–112)
GFR calc Af Amer: 34 mL/min — ABNORMAL LOW (ref >90)
Potassium: 4.2 mEq/L (ref 3.5–5.1)

## 2012-04-26 LAB — CREATININE CLEARANCE, URINE, 24 HOUR
Creatinine, Urine: 155.52 mg/dL
Creatinine: 1.97 mg/dL — ABNORMAL HIGH (ref 0.50–1.10)

## 2012-04-26 LAB — PROTEIN, URINE, 24 HOUR: Urine Total Volume-UPROT: 1175 mL

## 2012-04-26 MED ORDER — PREDNISONE 10 MG PO TABS
10.0000 mg | ORAL_TABLET | Freq: Every day | ORAL | Status: DC
  Administered 2012-04-26 – 2012-04-27 (×2): 10 mg via ORAL
  Filled 2012-04-26 (×3): qty 1

## 2012-04-26 MED ORDER — LABETALOL HCL 200 MG PO TABS
200.0000 mg | ORAL_TABLET | Freq: Two times a day (BID) | ORAL | Status: DC
  Administered 2012-04-26 – 2012-04-27 (×3): 200 mg via ORAL
  Filled 2012-04-26 (×4): qty 1

## 2012-04-26 MED ORDER — POLYETHYLENE GLYCOL 3350 17 G PO PACK
17.0000 g | PACK | Freq: Every day | ORAL | Status: DC
  Administered 2012-04-27: 17 g via ORAL
  Filled 2012-04-26 (×2): qty 1

## 2012-04-26 MED ORDER — SODIUM CHLORIDE 0.9 % IV SOLN
INTRAVENOUS | Status: AC
  Administered 2012-04-26: 21:00:00 via INTRAVENOUS

## 2012-04-26 NOTE — Progress Notes (Signed)
Stroke Team Progress Note  HISTORY [name] is a 47 y.o. female who was admitted to the hospital 04/21/2012 for diarrhea, nausea and vomiting. In the ER, she was found to have elevated BP with SBP 220 DBP 120. Patient was on coumadin for Factor V Leiden deficiency at home and was found to have a INR of 4.38 on admission. CT of head was obtained due to patient complaining of persistent HA over last 2 months. CT revealed Intraventricular hemorrhage involving the third and fourth ventricles along with possible parenchymal hemorrhage in the corpus callosum just above the third ventricle. Her coumadin was stopped and neurology was consulted for further evaluation of IVH. When seen she was not complaining of a HA or any localizing or lateralizing symptoms. She did have some mild nausea which was well controlled. She was admitted to the neuro ICU for further evaluation and treatment.  SUBJECTIVE  Patient has had some nausea. Feels better.   OBJECTIVE Most recent Vital Signs: Filed Vitals:   04/25/12 2114 04/26/12 0239 04/26/12 0702 04/26/12 0924  BP: 125/59 140/62 147/79 164/89  Pulse: 99 81 94 91  Temp: 98.4 F (36.9 C) 98.5 F (36.9 C) 98.8 F (37.1 C) 98.7 F (37.1 C)  TempSrc: Oral Oral Oral   Resp: 18 18 18 20   Height:      Weight:      SpO2: 99% 100% 93% 96%   CBG (last 3)  No results found for this basename: GLUCAP,  in the last 72 hours  IV Fluid Intake:     MEDICATIONS  . amLODipine  10 mg Oral Daily  . docusate sodium  100 mg Oral BID  . labetalol  200 mg Oral BID  . levothyroxine  175 mcg Oral QAC breakfast  . ondansetron  4 mg Oral Q4H   Or  . ondansetron (ZOFRAN) IV  4 mg Intravenous Q4H  . pantoprazole  40 mg Oral BID  . PARoxetine  40 mg Oral Daily  . sodium chloride  3 mL Intravenous Q12H   PRN:  alum & mag hydroxide-simeth, HYDROcodone-acetaminophen, morphine injection, promethazine  Diet:  Clear Liquid advance as tolerated. Activity:  Up ad lib DVT  Prophylaxis:  SCDs   CLINICALLY SIGNIFICANT STUDIES Basic Metabolic Panel:   Recent Labs Lab 04/25/12 0700 04/25/12 0927 04/26/12 0852  NA  --  143 143  K  --  4.2 4.2  CL  --  108 110  CO2  --  25 25  GLUCOSE  --  93 82  BUN  --  46* 37*  CREATININE  --  2.60* 1.97*  CALCIUM  --  8.3* 8.2*  MG 2.3  --   --    Liver Function Tests:   Recent Labs Lab 04/21/12 1527  AST 30  ALT 33  ALKPHOS 136*  BILITOT 0.4  PROT 6.7  ALBUMIN 2.8*   CBC:   Recent Labs Lab 04/21/12 1527 04/23/12 0920  WBC 4.1 4.7  NEUTROABS 3.3  --   HGB 14.7 12.2  HCT 43.7 36.7  MCV 83.7 83.8  PLT 96* 99*   Coagulation:   Recent Labs Lab 04/21/12 1856 04/22/12 0630 04/23/12 0920 04/24/12 0535  LABPROT 39.1* 17.2* 12.6 13.2  INR 4.38* 1.44 0.95 1.01   Cardiac Enzymes:   Recent Labs Lab 04/24/12 1200  CKTOTAL 20   Urinalysis:   Recent Labs Lab 04/21/12 1950  COLORURINE RED*  LABSPEC 1.041*  PHURINE 6.0  GLUCOSEU NEGATIVE  HGBUR LARGE*  BILIRUBINUR LARGE*  KETONESUR 15*  PROTEINUR >300*  UROBILINOGEN 1.0  NITRITE POSITIVE*  LEUKOCYTESUR MODERATE*   Lipid Panel    Component Value Date/Time   CHOL 266* 06/03/2011 1433   TRIG 113.0 06/03/2011 1433   HDL 48.70 06/03/2011 1433   CHOLHDL 5 06/03/2011 1433   VLDL 22.6 06/03/2011 1433   LDLCALC  Value: 192        Total Cholesterol/HDL:CHD Risk Coronary Heart Disease Risk Table                     Men   Women  1/2 Average Risk   3.4   3.3  Average Risk       5.0   4.4  2 X Average Risk   9.6   7.1  3 X Average Risk  23.4   11.0        Use the calculated Patient Ratio above and the CHD Risk Table to determine the patient's CHD Risk.        ATP III CLASSIFICATION (LDL):  <100     mg/dL   Optimal  161-096  mg/dL   Near or Above                    Optimal  130-159  mg/dL   Borderline  045-409  mg/dL   High  >811     mg/dL   Very High* 10/18/7827 1729   HgbA1C  Lab Results  Component Value Date   HGBA1C 5.7 06/03/2011    Urine  Drug Screen:     Component Value Date/Time   LABOPIA NTD 04/18/2009 1254   COCAINSCRNUR NTD 04/18/2009 1254   LABBENZ NTD 04/18/2009 1254   AMPHETMU NTD 04/18/2009 1254   THCU NTD 04/18/2009 1254   LABBARB NTD 04/18/2009 1254    Alcohol Level: No results found for this basename: ETH,  in the last 168 hours  US Renal Port 04/22/2012   No hydronephrosis.  Minimal perinephric fluid noted bilaterally. Etiology indeterminate.   Original Report Authenticated By: Lacy Duverney, M.D.    CT of the brain   04/23/2012  Acute focus of hemorrhage in the left thalamus, third ventricle, and fourth ventricle.  Slight increase in measurement of the thalamic portion of the hemorrhage.  No developing mass effect or ventricular dilatation.    04/22/2012   1.  Intraventricular hemorrhage involving the third and fourth ventricles.  Possible parenchymal hemorrhage in the corpus callosum just above the third ventricle (versus volume averaging with the third ventricular hemorrhage). 2.  No evidence of hydrocephalus. 3.  No midline shift.     MRI of the brain  04/22/2012  1.  The hemorrhage is predominately within the left thalamus with slight extension into the third ventricle.  There is no hydrocephalus. 2.  T2 hyperintensities within the cerebral peduncle and brain stem.  This may be related to the acute event.  Chronic microvascular ischemic changes are also considered. 3.  Right maxillary sinus disease.  MRA of the brain  04/22/2012 Normal variant MRA circle of Willis without evidence for significant proximal stenosis, aneurysm, or branch vessel occlusion.    2D Echocardiogram  not felt to be indicated  Carotid Doppler  not felt to be indicated  EKG  ? Not ordered - will ask nursing to check.  Therapy Recommendations  - continued physical therapy recommended, but no follow up physical therapy.  Physical Exam   General - 47 year old female in bed with eyes closed  but awake. Heart - Regular rate and rhythm - soft  systolic murmur. Lungs - Clear to auscultation anteriorly. Abdomen - Soft - non tender Extremities - Distal pulses intact right lower extremity. Weak to absent left lower extremity. Trace edema with stasis changes bilaterally. Skin - Warm and dry  NEUROLOGIC: MENTAL STATUS: awake, alert, language fluent, comprehension intact CRANIAL NERVE: pupils equal and reactive to light, extraocular muscles intact, no nystagmus, facial sensation and strength symmetric, uvula midline, symmetric, tongue midline. MOTOR: normal bulk and tone, full strength in the BUE, BLE SENSORY: normal and symmetric to light touch COORDINATION: finger-nose-finger, fine finger movements normal   ASSESSMENT Ms. EMI LYMON is a 47 y.o. female presenting with diarrhea, nausea, vomiting, headache in the setting of BP 220/120 and elevated INR of 4.38 on warfarin. 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 Hemorrhage secondary to malignant hypertension.  On warfarin prior to admission. Patient with resultant headache, nausea. Work up underway.   Factor V Leiden on chronic coumadin - INR today 1.01 Hyperlipidemia, not on statin PTA - cholesterol 266 - LDL 192 - consider adding statin when nausea resolves. Recheck LFTs first. Malignant Hypertension - blood pressure 147/79 to 167/89. Morbid obesity, Body mass index is 46.59 kg/(m^2). 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 Anxiety disorder History of congestive heart failure per patient history. Hypothyroid on synthroid with normal TSH  Hospital day # 5  TREATMENT/PLAN  SBP goal < 160, norvasc added  OOB, continue activity with PT  Consider F/u CT head Sat or Sunday if indicated  Renal function worsening; mgmt per medicine and nephrology  Recommend statin  therapy when nausea releived and after re-evaluation of LFTs.  Will need to remain off of coumadin. Will change to aspirin upon discharge; however, if problems occur on aspirin, would change to novel anticoagulant agents. Will repeat head scan tomorrow BEFORE starting baby aspirin to assure that scan is stable.   Gwendolyn Lima. Manson Passey, [facility], MBA, MHA [facility] Pager: [phone] 04/26/2012 11:39 AM  I have personally obtained a history, examined the patient, evaluated imaging results, and formulated the assessment and plan of care. I agree with the above.  Delia Heady, MD

## 2012-04-26 NOTE — Progress Notes (Signed)
Physical Therapy Treatment Patient Details Name: [name] MRN: [id number] DOB: [birth year] Today's Date: 04/26/2012 Time: 4098-1191 PT Time Calculation (min): 13 min  PT Assessment / Plan / Recommendation Comments on Treatment Session  Pt mobilzing well and safe to d/c home from PT stand point. Pt functioning near baseline. Pt's mother to stay with her for a few days to assist with child care. PT freq decreased to 1x/wk due to pt progressing so well.    Follow Up Recommendations  No PT follow up     Does the patient have the potential to tolerate intense rehabilitation     Barriers to Discharge        Equipment Recommendations  None recommended by PT    Recommendations for Other Services    Frequency Min 1X/week   Plan Discharge plan remains appropriate;Frequency needs to be updated    Precautions / Restrictions Restrictions Weight Bearing Restrictions: No   Pertinent Vitals/Pain Denies pain    Mobility  Bed Mobility Bed Mobility: Not assessed Transfers Transfers: Sit to Stand;Stand to Sit Sit to Stand: 6: Modified independent (Device/Increase time);With upper extremity assist;With armrests;From chair/3-in-1 Stand to Sit: 6: Modified independent (Device/Increase time);With upper extremity assist;With armrests;To chair/3-in-1 Details for Transfer Assistance: pt with safe technique Ambulation/Gait Ambulation/Gait Assistance: 5: Supervision Ambulation Distance (Feet): 250 Feet Assistive device: None Ambulation/Gait Assistance Details: pt with decreased cadence but stable. no episodes of LOB Gait Pattern: Within Functional Limits Gait velocity: decreased compared to PTA per pt but safe General Gait Details: Pt overall steady with ambulation Stairs: Yes Stairs Assistance: 5: Supervision Stairs Assistance Details (indicate cue type and reason): step-to pattern with R handrail Stair Management Technique: One rail Right;Step to pattern Number of Stairs: 4 (limited by IV  pole line) Wheelchair Mobility Wheelchair Mobility: No    Exercises     PT Diagnosis:    PT Problem List:   PT Treatment Interventions:     PT Goals Acute Rehab PT Goals PT Goal: Stand - Progress: Progressing toward goal PT Goal: Ambulate - Progress: Progressing toward goal PT Goal: Up/Down Stairs - Progress: Progressing toward goal  Visit Information  Last PT Received On: 04/26/12    Subjective Data  Subjective: Pt received sitting up in chair agreeable to PT. Patient Stated Goal: home   Cognition  Cognition Overall Cognitive Status: Appears within functional limits for tasks assessed/performed Arousal/Alertness: Awake/alert Orientation Level: Appears intact for tasks assessed Behavior During Session: [facility] for tasks performed    Balance     End of Session PT - End of Session Activity Tolerance: Patient tolerated treatment well Patient left: in chair;with call bell/phone within reach;with family/visitor present Nurse Communication: Mobility status   GP     [name] 04/26/2012, 3:16 PM   Lewis Shock, PT, DPT Pager #: [phone] Office #: [phone]

## 2012-04-26 NOTE — Progress Notes (Signed)
[facility] PROGRESS NOTE  [name] JWJ:[id number] DOB: [birth year] DOA: 04/21/2012 PCP: Roxy Manns, MD  Impression/Recommendations: Hypertensive emergency/AKI: - Pt off nicardipine and , Bidil, now on Norvasc, labetalol. - BP was low yesterday she had dizziness, now BP 160's. - Previous Cr. 0.8 on 2012. High 2.6 3.23.2014 now trending down with IV and holding ACE. - 24hr urine and protein collection pending - Goal SBP <160. - Steroids. - renal ultrasound: no chronic medical renal disease.   ICH, presumed hypertensive: - CT head in am - Anticoagulation reversed. - MRI brain 3/20 to evaluate for intraparenchymal hemorrhage  - monitor mental status closely, for hydrocephalus - further management per Neurology. ? Repeat CT head for hydrocephalus.     Hx of PE/  Chronic anticoagulation/  DVT, HX OF/ Factor V Leiden: - reversed anticoagulation. dc'd coumadin.  HYPOTHYROIDISM, POSTABLATION: - continue synthroid.   OSA on CPAP: - c-pap prn.    S/P bariatric surgery: - stable.   nausea and vomiting: - zofran., due Increase IC pressure.  Other Consultants:  Neurologist  Procedures:  MRI  Subjective: No complains  Objective: Filed Vitals:   04/25/12 2114 04/26/12 0239 04/26/12 0702 04/26/12 0924  BP: 125/59 140/62 147/79 164/89  Pulse: 99 81 94 91  Temp: 98.4 F (36.9 C) 98.5 F (36.9 C) 98.8 F (37.1 C) 98.7 F (37.1 C)  TempSrc: Oral Oral Oral   Resp: 18 18 18 20   Height:      Weight:      SpO2: 99% 100% 93% 96%     Intake/Output Summary (Last 24 hours) at 04/26/12 1238 Last data filed at 04/26/12 1024  Gross per 24 hour  Intake    360 ml  Output   1300 ml  Net   -940 ml    Exam: General: A&O x3, obese Heart: RRR Lung: good air movement CTA B/L Abdomen: NT, ND, soft.  Data Reviewed: Basic Metabolic Panel:  Recent Labs Lab 04/21/12 1527 04/22/12 1000 04/23/12 0920 04/25/12 0700 04/25/12 0927 04/26/12 0852  NA 138 137 141  --   143 143  K 4.2 4.4 4.0  --  4.2 4.2  CL 103 104 107  --  108 110  CO2 26 24 24   --  25 25  GLUCOSE 117* 121* 101*  --  93 82  BUN 18 23 29*  --  46* 37*  CREATININE 1.21* 1.33* 1.59*  --  2.60* 1.97*  CALCIUM 8.4 7.7* 8.1*  --  8.3* 8.2*  MG  --   --   --  2.3  --   --    Liver Function Tests:  Recent Labs Lab 04/21/12 1527  AST 30  ALT 33  ALKPHOS 136*  BILITOT 0.4  PROT 6.7  ALBUMIN 2.8*    Recent Labs Lab 04/21/12 1527  LIPASE 23   No results found for this basename: AMMONIA,  in the last 168 hours CBC:  Recent Labs Lab 04/21/12 1527 04/23/12 0920  WBC 4.1 4.7  NEUTROABS 3.3  --   HGB 14.7 12.2  HCT 43.7 36.7  MCV 83.7 83.8  PLT 96* 99*   Cardiac Enzymes:  Recent Labs Lab 04/24/12 1200  CKTOTAL 20   BNP: No components found with this basename: POCBNP,  CBG: No results found for this basename: GLUCAP,  in the last 168 hours  Recent Results (from the past 240 hour(s))  URINE CULTURE     Status: None   Collection Time    04/21/12  7:50 PM      Result Value Range Status   Specimen Description URINE, RANDOM   Final   Special Requests NONE   Final   Culture  Setup Time 04/22/2012 02:42   Final   Colony Count NO GROWTH   Final   Culture NO GROWTH   Final   Report Status 04/23/2012 FINAL   Final  MRSA PCR SCREENING     Status: None   Collection Time    04/22/12  3:57 AM      Result Value Range Status   MRSA by PCR NEGATIVE  NEGATIVE Final   Comment:            The GeneXpert MRSA Assay (FDA     approved for NASAL specimens     only), is one component of a     comprehensive MRSA colonization     surveillance program. It is not     intended to diagnose MRSA     infection nor to guide or     monitor treatment for     MRSA infections.     Studies: Ct Head Wo Contrast  04/23/2012  *RADIOLOGY REPORT*  Clinical Data: Follow-up intraventricular hemorrhage.  CT HEAD WITHOUT CONTRAST  Technique:  Contiguous axial images were obtained from the base  of the skull through the vertex without contrast.  Comparison: MRI brain 04/22/2012.  CT head 04/22/2012.  Findings: Focal acute hemorrhage in the left thalamus and third ventricle is again demonstrated.  Largest portion of the hemorrhage measures about 9 mm diameter, slightly increased from 7.6 mm previously. This may be within measurement error.  Tiny focus of hemorrhage again demonstrated fourth ventricle, stable.  No new hemorrhage is identified.  The ventricles are not dilated.  No mass effect or midline shift.  IMPRESSION: Acute focus of hemorrhage in the left thalamus, third ventricle, and fourth ventricle.  Slight increase in measurement of the thalamic portion of the hemorrhage.  No developing mass effect or ventricular dilatation.   Original Report Authenticated By: Burman Nieves, M.D.    Ct Head Wo Contrast  04/22/2012  *RADIOLOGY REPORT*  Clinical Data: Headache.  Nausea.  Recent norovirus infection.  CT HEAD WITHOUT CONTRAST  Technique:  Contiguous axial images were obtained from the base of the skull through the vertex without contrast.  Comparison: CT head 04/06/2008, 10/28/2004.  MRI brain 10/28/2004.  Findings: Acute intraventricular hemorrhage involving the third and fourth ventricles.  No evidence of hydrocephalus.  No midline shift.  Volume averaging with the hemorrhage in the third ventricle versus a small focus of parenchymal hemorrhage in the corpus callosum immediately above the third ventricle (image 15).  No acute hemorrhage or hematoma elsewhere.  No extra-axial fluid collections.  Mucosal thickening involving the right maxillary sinus.  Remaining visualized paranasal sinuses, bilateral mastoid air cells, and bilateral middle ear cavities well-aerated.  Extensive bilateral carotid siphon vertebral artery atherosclerosis for age.  IMPRESSION:  1.  Intraventricular hemorrhage involving the third and fourth ventricles.  Possible parenchymal hemorrhage in the corpus callosum just above  the third ventricle (versus volume averaging with the third ventricular hemorrhage). 2.  No evidence of hydrocephalus. 3.  No midline shift.  Non-emergent MRI of the brain without with contrast is recommended in further evaluation.  Critical Value/emergent results were called by telephone at the time of interpretation on 04/22/2012 at 0130 hours to [facility], of the emergency department, who verbally acknowledged these results.   Original Report Authenticated By: Hulan Saas,  M.D.    Mr Maxine Glenn Head Wo Contrast  04/22/2012  *RADIOLOGY REPORT*  Clinical Data:  Interventricular hemorrhage.  Parenchymal hemorrhage.  MRI HEAD WITHOUT AND WITH CONTRAST MRA HEAD WITHOUT CONTRAST  Technique:  Multiplanar, multiecho pulse sequences of the brain and surrounding structures were obtained without and with intravenous contrast.  Angiographic images of the head were obtained using MRA technique without contrast.  Contrast: 20mL MULTIHANCE GADOBENATE DIMEGLUMINE 529 MG/ML IV SOLN  Comparison:  CT head without contrast 04/22/2012.  MRI HEAD WITHOUT AND WITH CONTRAST  Findings:  The medial left thalamic hemorrhage extends into the third ventricle the majority of this hemorrhage is in the thalamus. Minimal layering blood products are noted in the posterior horn of the right left lateral ventricle.  None are evident on the right. No additional hemorrhages present.  Mild periventricular and scattered subcortical T2 hyperintensities are slightly greater than expected for age.  There is no associated pathologic enhancement.  No vascular lesions are identified.  No underlying mass lesion is present.  The postcontrast images demonstrate no pathologic enhancement.  Flow is present in the major intracranial arteries.  The globes and orbits are intact.  Mild circumferential mucosal thickening is present in the maxillary sinuses bilaterally, right greater than left.  The paranasal sinuses and mastoid air cells are otherwise  clear.  IMPRESSION:  1.  The hemorrhage is predominately within the left thalamus with slight extension into the third ventricle.  There is no hydrocephalus. 2.  T2 hyperintensities within the cerebral peduncle and brain stem.  This may be related to the acute event.  Chronic microvascular ischemic changes are also considered. 3.  Right maxillary sinus disease.  MRA HEAD  Findings: The internal carotid arteries are within normal limits from high cervical segments through the ICA termini.  The A1 and M1 segments are normal.  ACA and MCA branch vessels are within normal limits.  No significant vascular anomaly is associated with the hemorrhage.  The vertebral arteries are codominant.  The right a AICA is dominant.  The basilar artery is normal.  Both posterior cerebral arteries originate from basilar tip.  The PCA branch vessels are unremarkable.  IMPRESSION: Normal variant MRA circle of Willis without evidence for significant proximal stenosis, aneurysm, or branch vessel occlusion.   Original Report Authenticated By: Marin Roberts, M.D.    Mr Laqueta Jean ZO Contrast  04/22/2012  *RADIOLOGY REPORT*  Clinical Data:  Interventricular hemorrhage.  Parenchymal hemorrhage.  MRI HEAD WITHOUT AND WITH CONTRAST MRA HEAD WITHOUT CONTRAST  Technique:  Multiplanar, multiecho pulse sequences of the brain and surrounding structures were obtained without and with intravenous contrast.  Angiographic images of the head were obtained using MRA technique without contrast.  Contrast: 20mL MULTIHANCE GADOBENATE DIMEGLUMINE 529 MG/ML IV SOLN  Comparison:  CT head without contrast 04/22/2012.  MRI HEAD WITHOUT AND WITH CONTRAST  Findings:  The medial left thalamic hemorrhage extends into the third ventricle the majority of this hemorrhage is in the thalamus. Minimal layering blood products are noted in the posterior horn of the right left lateral ventricle.  None are evident on the right. No additional hemorrhages present.  Mild  periventricular and scattered subcortical T2 hyperintensities are slightly greater than expected for age.  There is no associated pathologic enhancement.  No vascular lesions are identified.  No underlying mass lesion is present.  The postcontrast images demonstrate no pathologic enhancement.  Flow is present in the major intracranial arteries.  The globes and orbits are intact.  Mild circumferential mucosal thickening is present in the maxillary sinuses bilaterally, right greater than left.  The paranasal sinuses and mastoid air cells are otherwise clear.  IMPRESSION:  1.  The hemorrhage is predominately within the left thalamus with slight extension into the third ventricle.  There is no hydrocephalus. 2.  T2 hyperintensities within the cerebral peduncle and brain stem.  This may be related to the acute event.  Chronic microvascular ischemic changes are also considered. 3.  Right maxillary sinus disease.  MRA HEAD  Findings: The internal carotid arteries are within normal limits from high cervical segments through the ICA termini.  The A1 and M1 segments are normal.  ACA and MCA branch vessels are within normal limits.  No significant vascular anomaly is associated with the hemorrhage.  The vertebral arteries are codominant.  The right a AICA is dominant.  The basilar artery is normal.  Both posterior cerebral arteries originate from basilar tip.  The PCA branch vessels are unremarkable.  IMPRESSION: Normal variant MRA circle of Willis without evidence for significant proximal stenosis, aneurysm, or branch vessel occlusion.   Original Report Authenticated By: Marin Roberts, M.D.    US Renal Port  04/22/2012  *RADIOLOGY REPORT*  Clinical Data: Elevated creatinine.  Nephritis.  RENAL/URINARY TRACT ULTRASOUND COMPLETE  Comparison:  09/06/2004.  Findings:  Right Kidney:  12.9 cm. No hydronephrosis or renal mass.  Minimal perinephric fluid.  Left Kidney:  13.9 cm.  No hydronephrosis or renal mass.  Minimal  perinephric fluid.  Bladder:  Decompressed by Foley catheter.  IMPRESSION: No hydronephrosis.  Minimal perinephric fluid noted bilaterally. Etiology indeterminate.   Original Report Authenticated By: Lacy Duverney, M.D.     Scheduled Meds: . amLODipine  10 mg Oral Daily  . docusate sodium  100 mg Oral BID  . labetalol  200 mg Oral BID  . levothyroxine  175 mcg Oral QAC breakfast  . ondansetron  4 mg Oral Q4H   Or  . ondansetron (ZOFRAN) IV  4 mg Intravenous Q4H  . pantoprazole  40 mg Oral BID  . PARoxetine  40 mg Oral Daily  . sodium chloride  3 mL Intravenous Q12H   Continuous Infusions:    Code Status: full Family Communication: none Disposition Plan: home 3-4 days  Marinda Elk [facility] Pager [phone]  If 7PM-7AM, please contact night-coverage [email or web address] password [facility] 04/26/2012, 12:38 PM   LOS: 5 days

## 2012-04-26 NOTE — Progress Notes (Signed)
RT went in to check on pt with CPAP.  Given instruction to call if anything needed.  She puts herself on/off cpap.

## 2012-04-27 ENCOUNTER — Inpatient Hospital Stay (HOSPITAL_COMMUNITY): Payer: Managed Care, Other (non HMO)

## 2012-04-27 DIAGNOSIS — I619 Nontraumatic intracerebral hemorrhage, unspecified: Secondary | ICD-10-CM

## 2012-04-27 DIAGNOSIS — I629 Nontraumatic intracranial hemorrhage, unspecified: Secondary | ICD-10-CM

## 2012-04-27 LAB — BASIC METABOLIC PANEL WITH GFR
BUN: 31 mg/dL — ABNORMAL HIGH (ref 6–23)
CO2: 24 meq/L (ref 19–32)
Calcium: 8 mg/dL — ABNORMAL LOW (ref 8.4–10.5)
Chloride: 110 meq/L (ref 96–112)
Creatinine, Ser: 1.71 mg/dL — ABNORMAL HIGH (ref 0.50–1.10)
GFR calc Af Amer: 41 mL/min — ABNORMAL LOW
GFR calc non Af Amer: 35 mL/min — ABNORMAL LOW
Glucose, Bld: 81 mg/dL (ref 70–99)
Potassium: 4.3 meq/L (ref 3.5–5.1)
Sodium: 142 meq/L (ref 135–145)

## 2012-04-27 MED ORDER — ASPIRIN EC 81 MG PO TBEC
81.0000 mg | DELAYED_RELEASE_TABLET | Freq: Every day | ORAL | Status: DC
  Filled 2012-04-27: qty 1

## 2012-04-27 MED ORDER — AMLODIPINE BESYLATE 10 MG PO TABS: 10.0000 mg | ORAL_TABLET | Freq: Every day | ORAL | Status: DC

## 2012-04-27 MED ORDER — LABETALOL HCL 200 MG PO TABS: 200.0000 mg | ORAL_TABLET | Freq: Two times a day (BID) | ORAL | Status: DC

## 2012-04-27 MED ORDER — ASPIRIN 81 MG PO TBEC: 81.0000 mg | DELAYED_RELEASE_TABLET | Freq: Every day | ORAL | Status: DC

## 2012-04-27 NOTE — Care Management Note (Addendum)
    Page 1 of 1   04/27/2012     3:39:11 PM   CARE MANAGEMENT NOTE 04/27/2012  Patient:  [name], [name]   Account Number:  [id number]  Date Initiated:  04/26/2012  Documentation initiated by:  [facility]  Subjective/Objective Assessment:   admitted with CVA, lupus     Action/Plan:   PT evals-no follow up recommended   Anticipated DC Date:  04/27/2012   Anticipated DC Plan:  HOME/SELF CARE      DC Planning Services  CM consult      Choice offered to / List presented to:             Status of service:  Completed, signed off Medicare Important Message given?   (If response is "NO", the following Medicare IM given date fields will be blank) Date Medicare IM given:   Date Additional Medicare IM given:    Discharge Disposition:  HOME/SELF CARE  Per UR Regulation:  Reviewed for med. necessity/level of care/duration of stay  If discussed at Long Length of Stay Meetings, dates discussed:    Comments:

## 2012-04-27 NOTE — Progress Notes (Signed)
Stroke Team Progress Note  HISTORY [name] is a 47 y.o. female who was admitted to the hospital 04/21/2012 for diarrhea, nausea and vomiting. In the ER, she was found to have elevated BP with SBP 220 DBP 120. Patient was on coumadin for Factor V Leiden deficiency at home and was found to have a INR of 4.38 on admission. CT of head was obtained due to patient complaining of persistent HA over last 2 months. CT revealed Intraventricular hemorrhage involving the third and fourth ventricles along with possible parenchymal hemorrhage in the corpus callosum just above the third ventricle. Her coumadin was stopped and neurology was consulted for further evaluation of IVH. When seen she was not complaining of a HA or any localizing or lateralizing symptoms. She did have some mild nausea which was well controlled. She was admitted to the neuro ICU for further evaluation and treatment.  SUBJECTIVE  Nausea improved. Patient states she feels better. Anticipating going home today.   OBJECTIVE Most recent Vital Signs: Filed Vitals:   04/26/12 1746 04/26/12 2123 04/27/12 0700 04/27/12 0900  BP: 147/72 128/76 145/87 154/85  Pulse: 88 95 65 88  Temp: 99.3 F (37.4 C) 99.6 F (37.6 C)  98.5 F (36.9 C)  TempSrc: Oral Oral  Oral  Resp: 20 20 18 18   Height:      Weight:      SpO2: 95% 94% 95% 95%   CBG (last 3)  No results found for this basename: GLUCAP,  in the last 72 hours  IV Fluid Intake:   . sodium chloride 75 mL/hr at 04/26/12 2118    MEDICATIONS  . amLODipine  10 mg Oral Daily  . docusate sodium  100 mg Oral BID  . labetalol  200 mg Oral BID  . levothyroxine  175 mcg Oral QAC breakfast  . ondansetron  4 mg Oral Q4H   Or  . ondansetron (ZOFRAN) IV  4 mg Intravenous Q4H  . pantoprazole  40 mg Oral BID  . PARoxetine  40 mg Oral Daily  . polyethylene glycol  17 g Oral Daily  . predniSONE  10 mg Oral Q breakfast  . sodium chloride  3 mL Intravenous Q12H   PRN:  alum & mag  hydroxide-simeth, HYDROcodone-acetaminophen, morphine injection, promethazine  Diet:  General advance as tolerated. Activity:  Up ad lib DVT Prophylaxis:  SCDs   CLINICALLY SIGNIFICANT STUDIES Basic Metabolic Panel:   Recent Labs Lab 04/25/12 0700  04/26/12 0852 04/27/12 0818  NA  --   < > 143 142  K  --   < > 4.2 4.3  CL  --   < > 110 110  CO2  --   < > 25 24  GLUCOSE  --   < > 82 81  BUN  --   < > 37* 31*  CREATININE  --   < > 1.97* 1.71*  CALCIUM  --   < > 8.2* 8.0*  MG 2.3  --   --   --   < > = values in this interval not displayed. Liver Function Tests:   Recent Labs Lab 04/21/12 1527  AST 30  ALT 33  ALKPHOS 136*  BILITOT 0.4  PROT 6.7  ALBUMIN 2.8*   CBC:   Recent Labs Lab 04/21/12 1527 04/23/12 0920  WBC 4.1 4.7  NEUTROABS 3.3  --   HGB 14.7 12.2  HCT 43.7 36.7  MCV 83.7 83.8  PLT 96* 99*   Coagulation:  Recent Labs Lab 04/21/12 1856 04/22/12 0630 04/23/12 0920 04/24/12 0535  LABPROT 39.1* 17.2* 12.6 13.2  INR 4.38* 1.44 0.95 1.01   Cardiac Enzymes:   Recent Labs Lab 04/24/12 1200  CKTOTAL 20   Urinalysis:   Recent Labs Lab 04/21/12 1950  COLORURINE RED*  LABSPEC 1.041*  PHURINE 6.0  GLUCOSEU NEGATIVE  HGBUR LARGE*  BILIRUBINUR LARGE*  KETONESUR 15*  PROTEINUR >300*  UROBILINOGEN 1.0  NITRITE POSITIVE*  LEUKOCYTESUR MODERATE*   Lipid Panel    Component Value Date/Time   CHOL 266* 06/03/2011 1433   TRIG 113.0 06/03/2011 1433   HDL 48.70 06/03/2011 1433   CHOLHDL 5 06/03/2011 1433   VLDL 22.6 06/03/2011 1433   LDLCALC  Value: 192        Total Cholesterol/HDL:CHD Risk Coronary Heart Disease Risk Table                     Men   Women  1/2 Average Risk   3.4   3.3  Average Risk       5.0   4.4  2 X Average Risk   9.6   7.1  3 X Average Risk  23.4   11.0        Use the calculated Patient Ratio above and the CHD Risk Table to determine the patient's CHD Risk.        ATP III CLASSIFICATION (LDL):  <100     mg/dL   Optimal   161-096  mg/dL   Near or Above                    Optimal  130-159  mg/dL   Borderline  045-409  mg/dL   High  >811     mg/dL   Very High* 10/18/7827 1729   HgbA1C  Lab Results  Component Value Date   HGBA1C 5.7 06/03/2011    Urine Drug Screen:     Component Value Date/Time   LABOPIA NTD 04/18/2009 1254   COCAINSCRNUR NTD 04/18/2009 1254   LABBENZ NTD 04/18/2009 1254   AMPHETMU NTD 04/18/2009 1254   THCU NTD 04/18/2009 1254   LABBARB NTD 04/18/2009 1254    Alcohol Level: No results found for this basename: ETH,  in the last 168 hours  US Renal Port 04/22/2012   No hydronephrosis.  Minimal perinephric fluid noted bilaterally. Etiology indeterminate.   Original Report Authenticated By: Lacy Duverney, M.D.    CT of the brain   04/23/2012  Acute focus of hemorrhage in the left thalamus, third ventricle, and fourth ventricle.  Slight increase in measurement of the thalamic portion of the hemorrhage.  No developing mass effect or ventricular dilatation.    04/22/2012   1.  Intraventricular hemorrhage involving the third and fourth ventricles.  Possible parenchymal hemorrhage in the corpus callosum just above the third ventricle (versus volume averaging with the third ventricular hemorrhage). 2.  No evidence of hydrocephalus. 3.  No midline shift.     MRI of the brain  04/22/2012  1.  The hemorrhage is predominately within the left thalamus with slight extension into the third ventricle.  There is no hydrocephalus. 2.  T2 hyperintensities within the cerebral peduncle and brain stem.  This may be related to the acute event.  Chronic microvascular ischemic changes are also considered. 3.  Right maxillary sinus disease.  MRA of the brain  04/22/2012 Normal variant MRA circle of Willis without evidence for significant proximal stenosis, aneurysm, or branch  vessel occlusion.    2D Echocardiogram  not felt to be indicated  Carotid Doppler  not felt to be indicated  EKG  ? Not ordered - will ask nursing to  check.  Therapy Recommendations  - continued physical therapy recommended, but no follow up physical therapy.  Physical Exam   General - 47 year old female in bed with eyes closed but awake. Heart - Regular rate and rhythm - soft systolic murmur. Lungs - Clear to auscultation anteriorly. Abdomen - Soft - non tender Extremities - Distal pulses intact right lower extremity. Weak to absent left lower extremity. Trace edema with stasis changes bilaterally. Skin - Warm and dry  NEUROLOGIC: MENTAL STATUS: awake, alert, language fluent, comprehension intact CRANIAL NERVE: pupils equal and reactive to light, extraocular muscles intact, no nystagmus, facial sensation and strength symmetric, uvula midline, symmetric, tongue midline. MOTOR: normal bulk and tone, full strength in the BUE, BLE SENSORY: normal and symmetric to light touch COORDINATION: finger-nose-finger, fine finger movements normal   ASSESSMENT [name] is a 47 y.o. female presenting with diarrhea, nausea, vomiting, headache in the setting of BP 220/120 and elevated INR of 4.38 on warfarin. 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 Hemorrhage secondary to malignant hypertension.  On warfarin prior to admission. Patient with resultant headache, nausea. Work up underway.   Factor V Leiden on chronic coumadin - INR today 1.01 Hyperlipidemia, LDL 192, goal < 100, patient was not on statin PTA  Malignant Hypertension - blood pressure 147/79 to 167/89. Morbid obesity, Body mass index is 46.59 kg/(m^2). 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 Anxiety disorder History of congestive heart failure per patient history. Hypothyroid on synthroid with normal TSH thrombocytopenia  Hospital day # 6  TREATMENT/PLAN  SBP  goal < 160, norvasc added. Patient was not on any blood pressure medication PTA.  Continue activity with PT while here, none recommended as outpatient.  Renal function mgmt per medicine and nephrology  Recommend statin therapy   Risk factor management  Will need to remain off of coumadin. CT Head shows stabilization of hemorrhage; therefore, will start baby aspirin 81mg  daily.  Patient needs to follow up with primary MD in 1 week for recheck including platelet count.  Stroke service will sign off. Follow up in 1 month.  Gwendolyn Lima. Manson Passey, [facility], MBA, MHA [facility] Pager: [phone] 04/27/2012 9:52 AM  I have personally obtained a history, examined the patient, evaluated imaging results, and formulated the assessment and plan of care. I agree with the above.  Delia Heady, MD

## 2012-04-29 NOTE — Discharge Summary (Signed)
Physician Discharge Summary  [name] HKV:[id number] DOB: 10-05-1965 DOA: 04/21/2012  PCP: Roxy Manns, MD  Admit date: 04/21/2012 Discharge date: 04/29/2012  Time spent: 30 minutes  Recommendations for Outpatient Follow-up:  1. Follow up with neurologist, hematology and PCP 3-6 weeks.  Discharge Diagnoses:  Principal Problem:   ICH, presumed hypertensive Active Problems:   HYPOTHYROIDISM, POSTABLATION   OBESITY   Hx of PE   DVT, HX OF   OSA on CPAP   Hypertensive emergency   Chronic anticoagulation   S/P bariatric surgery   Factor V Leiden   AKI (acute kidney injury)   Discharge Condition: stable  Diet recommendation: heart healthy diet  Filed Weights   04/22/12 0405  Weight: 127 kg (279 lb 15.8 oz)    History of present illness:  47 y.o. 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 She also has a persistent HA which has been going on since at least Jan 14. In the ER, she was found to be severely HTN with SBP 220 DBP 120. Hospitalist was asked to admit her for hypertensive urgency. Further work up included normal renal fx tests, normal lipase, and WBC of 3.9K. I reviewed her record and found that she was HTN as well last visit in the ER with SBP 170/ DBP 110. She also said that she was hypertensive before her Bariatric surgery, but no longer on any antiHTN meds since the surgery. She has been on chronic prednisone 5mg  per day until last week, where it was increased to 20mg  per day.   Hospital Course:  Hypertensive emergency/AKI:  - Pt started on nicardipine then switch to oral meds. , Bidil, now on Norvasc, labetalol.  - BP was low yesterday she had dizziness, now BP 160's.  - Previous Cr. 0.8 on 2012. High 2.6 3.23.2014 now trending down with IV and holding ACE. Do not resume ACE follow up  with PCP. - Goal SBP <160.  - renal ultrasound: no chronic medical renal disease.   ICH, presumed hypertensive:  -- Anticoagulation reversed.  - MRI brain 3/20 to evaluate for intraparenchymal hemorrhage  - monitor mental status closely, for hydrocephalus  - Repeated CT head showed improved ICH. - Neuro rec ASA and follow up with them as an outpatient.  Hx of PE/ Chronic anticoagulation/ DVT, HX OF/ Factor V Leiden:  - reversed anticoagulation. dc'd coumadin.   HYPOTHYROIDISM, POSTABLATION:  - continue synthroid.   OSA on CPAP:  - c-pap prn.   S/P bariatric surgery:  - stable.   nausea and vomiting:  - zofran., due Increase IC pressure.   Procedures:  See below  Consultations:  NEurologu  Discharge Exam: Filed Vitals:   04/26/12 2123 04/27/12 0700 04/27/12 0900 04/27/12 1027  BP: 128/76 145/87 154/85 156/82  Pulse: 95 65 88 93  Temp: 99.6 F (37.6 C)  98.5 F (36.9 C) 98.1 F (36.7 C)  TempSrc: Oral  Oral Oral  Resp: 20 18 18 20   Height:      Weight:      SpO2: 94% 95% 95% 95%    See progress note.  Discharge Instructions  Discharge Orders   Future Orders Complete By Expires     Diet - low sodium heart healthy  As directed     Increase activity slowly  As directed  Medication List    STOP taking these medications       warfarin 1 MG tablet  Commonly known as:  COUMADIN      TAKE these medications       amLODipine 10 MG tablet  Commonly known as:  NORVASC  Take 1 tablet (10 mg total) by mouth daily.     aspirin 81 MG EC tablet  Take 1 tablet (81 mg total) by mouth daily.     labetalol 200 MG tablet  Commonly known as:  NORMODYNE  Take 1 tablet (200 mg total) by mouth 2 (two) times daily.     levothyroxine 175 MCG tablet  Commonly known as:  SYNTHROID, LEVOTHROID  Take 1 tablet (175 mcg total) by mouth daily.     multivitamin tablet  Take 1 tablet by mouth daily.     omeprazole 20 MG capsule  Commonly known as:  PRILOSEC   Take 20 mg by mouth daily.     PARoxetine 40 MG tablet  Commonly known as:  PAXIL  take 1 tablet by mouth every morning     predniSONE 10 MG tablet  Commonly known as:  DELTASONE  Take 20 mg by mouth daily.     promethazine 25 MG tablet  Commonly known as:  PHENERGAN  Take 1 tablet (25 mg total) by mouth every 8 (eight) hours as needed for nausea.           Follow-up Information   Follow up with Roxy Manns, MD In 2 weeks. (follow up in 2 weeks.)    Contact information:   [address] [place] [address] [phone]       Follow up with Levert Feinstein, MD In 2 weeks. (hospital follow up)    Contact information:   [address] [place] [address] [phone]        The results of significant diagnostics from this hospitalization (including imaging, microbiology, ancillary and laboratory) are listed below for reference.    Significant Diagnostic Studies: Ct Head Wo Contrast  04/27/2012  *RADIOLOGY REPORT*  Clinical Data: Follow-up intracranial hemorrhage.  CT HEAD WITHOUT CONTRAST  Technique:  Contiguous axial images were obtained from the base of the skull through the vertex without contrast.  Comparison: 04/24/2011.  Findings: Continued improvement in the interventricular and parenchymal hemorrhage.  No hydrocephalus.  No mass effect.  No new foci of hemorrhage.  There is no midline shift.  Chronic paranasal sinus disease in the right maxillary sinus.  Globes appear within normal limits.  IMPRESSION: Continued improvement in left thalamus, third and fourth ventricular hemorrhage.  There is no longer blood identified in the fourth ventricle or third ventricle.   Original Report Authenticated By: Andreas Newport, M.D.    Ct Head Wo Contrast  04/23/2012  *RADIOLOGY REPORT*  Clinical Data: Follow-up intraventricular hemorrhage.  CT HEAD WITHOUT CONTRAST  Technique:  Contiguous axial images were obtained from the base of the skull  through the vertex without contrast.  Comparison: MRI brain 04/22/2012.  CT head 04/22/2012.  Findings: Focal acute hemorrhage in the left thalamus and third ventricle is again demonstrated.  Largest portion of the hemorrhage measures about 9 mm diameter, slightly increased from 7.6 mm previously. This may be within measurement error.  Tiny focus of hemorrhage again demonstrated fourth ventricle, stable.  No new hemorrhage is identified.  The ventricles are not dilated.  No mass effect or midline shift.  IMPRESSION: Acute focus of hemorrhage in  the left thalamus, third ventricle, and fourth ventricle.  Slight increase in measurement of the thalamic portion of the hemorrhage.  No developing mass effect or ventricular dilatation.   Original Report Authenticated By: Burman Nieves, M.D.    Ct Head Wo Contrast  04/22/2012  *RADIOLOGY REPORT*  Clinical Data: Headache.  Nausea.  Recent norovirus infection.  CT HEAD WITHOUT CONTRAST  Technique:  Contiguous axial images were obtained from the base of the skull through the vertex without contrast.  Comparison: CT head 04/06/2008, 10/28/2004.  MRI brain 10/28/2004.  Findings: Acute intraventricular hemorrhage involving the third and fourth ventricles.  No evidence of hydrocephalus.  No midline shift.  Volume averaging with the hemorrhage in the third ventricle versus a small focus of parenchymal hemorrhage in the corpus callosum immediately above the third ventricle (image 15).  No acute hemorrhage or hematoma elsewhere.  No extra-axial fluid collections.  Mucosal thickening involving the right maxillary sinus.  Remaining visualized paranasal sinuses, bilateral mastoid air cells, and bilateral middle ear cavities well-aerated.  Extensive bilateral carotid siphon vertebral artery atherosclerosis for age.  IMPRESSION:  1.  Intraventricular hemorrhage involving the third and fourth ventricles.  Possible parenchymal hemorrhage in the corpus callosum just above the third  ventricle (versus volume averaging with the third ventricular hemorrhage). 2.  No evidence of hydrocephalus. 3.  No midline shift.  Non-emergent MRI of the brain without with contrast is recommended in further evaluation.  Critical Value/emergent results were called by telephone at the time of interpretation on 04/22/2012 at 0130 hours to [facility], of the emergency department, who verbally acknowledged these results.   Original Report Authenticated By: Hulan Saas, M.D.    Mr [facility] Wo Contrast  04/22/2012  *RADIOLOGY REPORT*  Clinical Data:  Interventricular hemorrhage.  Parenchymal hemorrhage.  MRI HEAD WITHOUT AND WITH CONTRAST MRA HEAD WITHOUT CONTRAST  Technique:  Multiplanar, multiecho pulse sequences of the brain and surrounding structures were obtained without and with intravenous contrast.  Angiographic images of the head were obtained using MRA technique without contrast.  Contrast: 20mL MULTIHANCE GADOBENATE DIMEGLUMINE 529 MG/ML IV SOLN  Comparison:  CT head without contrast 04/22/2012.  MRI HEAD WITHOUT AND WITH CONTRAST  Findings:  The medial left thalamic hemorrhage extends into the third ventricle the majority of this hemorrhage is in the thalamus. Minimal layering blood products are noted in the posterior horn of the right left lateral ventricle.  None are evident on the right. No additional hemorrhages present.  Mild periventricular and scattered subcortical T2 hyperintensities are slightly greater than expected for age.  There is no associated pathologic enhancement.  No vascular lesions are identified.  No underlying mass lesion is present.  The postcontrast images demonstrate no pathologic enhancement.  Flow is present in the major intracranial arteries.  The globes and orbits are intact.  Mild circumferential mucosal thickening is present in the maxillary sinuses bilaterally, right greater than left.  The paranasal sinuses and mastoid air cells are otherwise clear.   IMPRESSION:  1.  The hemorrhage is predominately within the left thalamus with slight extension into the third ventricle.  There is no hydrocephalus. 2.  T2 hyperintensities within the cerebral peduncle and brain stem.  This may be related to the acute event.  Chronic microvascular ischemic changes are also considered. 3.  Right maxillary sinus disease.  MRA HEAD  Findings: The internal carotid arteries are within normal limits from high cervical segments through the ICA termini.  The A1 and M1 segments are  normal.  ACA and MCA branch vessels are within normal limits.  No significant vascular anomaly is associated with the hemorrhage.  The vertebral arteries are codominant.  The right a AICA is dominant.  The basilar artery is normal.  Both posterior cerebral arteries originate from basilar tip.  The PCA branch vessels are unremarkable.  IMPRESSION: Normal variant MRA circle of Willis without evidence for significant proximal stenosis, aneurysm, or branch vessel occlusion.   Original Report Authenticated By: Marin Roberts, M.D.    Mr [facility] ZO Contrast  04/22/2012  *RADIOLOGY REPORT*  Clinical Data:  Interventricular hemorrhage.  Parenchymal hemorrhage.  MRI HEAD WITHOUT AND WITH CONTRAST MRA HEAD WITHOUT CONTRAST  Technique:  Multiplanar, multiecho pulse sequences of the brain and surrounding structures were obtained without and with intravenous contrast.  Angiographic images of the head were obtained using MRA technique without contrast.  Contrast: 20mL MULTIHANCE GADOBENATE DIMEGLUMINE 529 MG/ML IV SOLN  Comparison:  CT head without contrast 04/22/2012.  MRI HEAD WITHOUT AND WITH CONTRAST  Findings:  The medial left thalamic hemorrhage extends into the third ventricle the majority of this hemorrhage is in the thalamus. Minimal layering blood products are noted in the posterior horn of the right left lateral ventricle.  None are evident on the right. No additional hemorrhages present.  Mild  periventricular and scattered subcortical T2 hyperintensities are slightly greater than expected for age.  There is no associated pathologic enhancement.  No vascular lesions are identified.  No underlying mass lesion is present.  The postcontrast images demonstrate no pathologic enhancement.  Flow is present in the major intracranial arteries.  The globes and orbits are intact.  Mild circumferential mucosal thickening is present in the maxillary sinuses bilaterally, right greater than left.  The paranasal sinuses and mastoid air cells are otherwise clear.  IMPRESSION:  1.  The hemorrhage is predominately within the left thalamus with slight extension into the third ventricle.  There is no hydrocephalus. 2.  T2 hyperintensities within the cerebral peduncle and brain stem.  This may be related to the acute event.  Chronic microvascular ischemic changes are also considered. 3.  Right maxillary sinus disease.  MRA HEAD  Findings: The internal carotid arteries are within normal limits from high cervical segments through the ICA termini.  The A1 and M1 segments are normal.  ACA and MCA branch vessels are within normal limits.  No significant vascular anomaly is associated with the hemorrhage.  The vertebral arteries are codominant.  The right a AICA is dominant.  The basilar artery is normal.  Both posterior cerebral arteries originate from basilar tip.  The PCA branch vessels are unremarkable.  IMPRESSION: Normal variant MRA circle of Willis without evidence for significant proximal stenosis, aneurysm, or branch vessel occlusion.   Original Report Authenticated By: Marin Roberts, M.D.    US Renal Port  04/22/2012  *RADIOLOGY REPORT*  Clinical Data: Elevated creatinine.  Nephritis.  RENAL/URINARY TRACT ULTRASOUND COMPLETE  Comparison:  09/06/2004.  Findings:  Right Kidney:  12.9 cm. No hydronephrosis or renal mass.  Minimal perinephric fluid.  Left Kidney:  13.9 cm.  No hydronephrosis or renal mass.  Minimal  perinephric fluid.  Bladder:  Decompressed by Foley catheter.  IMPRESSION: No hydronephrosis.  Minimal perinephric fluid noted bilaterally. Etiology indeterminate.   Original Report Authenticated By: Lacy Duverney, M.D.     Microbiology: Recent Results (from the past 240 hour(s))  URINE CULTURE     Status: None   Collection Time    04/21/12  7:50 PM      Result Value Range Status   Specimen Description URINE, RANDOM   Final   Special Requests NONE   Final   Culture  Setup Time 04/22/2012 02:42   Final   Colony Count NO GROWTH   Final   Culture NO GROWTH   Final   Report Status 04/23/2012 FINAL   Final  MRSA PCR SCREENING     Status: None   Collection Time    04/22/12  3:57 AM      Result Value Range Status   MRSA by PCR NEGATIVE  NEGATIVE Final   Comment:            The GeneXpert MRSA Assay (FDA     approved for NASAL specimens     only), is one component of a     comprehensive MRSA colonization     surveillance program. It is not     intended to diagnose MRSA     infection nor to guide or     monitor treatment for     MRSA infections.     Labs: Basic Metabolic Panel:  Recent Labs Lab 04/23/12 0920 04/25/12 0700 04/25/12 0800 04/25/12 0927 04/26/12 0852 04/27/12 0818  NA 141  --   --  143 143 142  K 4.0  --   --  4.2 4.2 4.3  CL 107  --   --  108 110 110  CO2 24  --   --  25 25 24   GLUCOSE 101*  --   --  93 82 81  BUN 29*  --   --  46* 37* 31*  CREATININE 1.59*  --  1.97* 2.60* 1.97* 1.71*  CALCIUM 8.1*  --   --  8.3* 8.2* 8.0*  MG  --  2.3  --   --   --   --    Liver Function Tests: No results found for this basename: AST, ALT, ALKPHOS, BILITOT, PROT, ALBUMIN,  in the last 168 hours No results found for this basename: LIPASE, AMYLASE,  in the last 168 hours No results found for this basename: AMMONIA,  in the last 168 hours CBC:  Recent Labs Lab 04/23/12 0920  WBC 4.7  HGB 12.2  HCT 36.7  MCV 83.8  PLT 99*   Cardiac Enzymes:  Recent Labs Lab  04/24/12 1200  CKTOTAL 20   BNP: BNP (last 3 results) No results found for this basename: PROBNP,  in the last 8760 hours CBG: No results found for this basename: GLUCAP,  in the last 168 hours     Signed:  Marinda Elk  [facility] 04/29/2012, 11:04 AM

## 2012-05-04 ENCOUNTER — Ambulatory Visit (INDEPENDENT_AMBULATORY_CARE_PROVIDER_SITE_OTHER): Payer: Managed Care, Other (non HMO) | Admitting: Family Medicine

## 2012-05-04 ENCOUNTER — Encounter: Payer: Self-pay | Admitting: Family Medicine

## 2012-05-04 VITALS — BP 80/60 | HR 84 | Temp 98.3°F | Ht 65.0 in | Wt 270.5 lb

## 2012-05-04 DIAGNOSIS — N179 Acute kidney failure, unspecified: Secondary | ICD-10-CM

## 2012-05-04 DIAGNOSIS — I951 Orthostatic hypotension: Secondary | ICD-10-CM | POA: Insufficient documentation

## 2012-05-04 DIAGNOSIS — D6859 Other primary thrombophilia: Secondary | ICD-10-CM

## 2012-05-04 DIAGNOSIS — I619 Nontraumatic intracerebral hemorrhage, unspecified: Secondary | ICD-10-CM

## 2012-05-04 DIAGNOSIS — D6851 Activated protein C resistance: Secondary | ICD-10-CM

## 2012-05-04 DIAGNOSIS — I10 Essential (primary) hypertension: Secondary | ICD-10-CM

## 2012-05-04 LAB — COMPREHENSIVE METABOLIC PANEL
AST: 36 U/L (ref 0–37)
Alkaline Phosphatase: 112 U/L (ref 39–117)
BUN: 22 mg/dL (ref 6–23)
Creatinine, Ser: 2.3 mg/dL — ABNORMAL HIGH (ref 0.4–1.2)

## 2012-05-04 NOTE — Assessment & Plan Note (Signed)
Pt is off coumadin due to ICH  On asa currently Has hematology f/u  Disc s/s of blood clot to watch for

## 2012-05-04 NOTE — Progress Notes (Signed)
Subjective:    Patient ID: [name], female    DOB: [birth year], 47 y.o.   MRN: [id number]  HPI Here for hosp f/u Pt was hosp for headache and hypertensive urgency- found to have intercranial hemorrhage that improved with reversal of anticoagulation and bp control  N/v imp also  Cr went up to 2.6- and at discharge was 1.7-- ace indefinitely held  (renal US was ok)  Added norvasc and labetolol Coumadin stopped and subst with asa   She is absolutely exhausted and also somewhat dizzy  bp is much much lower - feels to be in a fog - she feels fuzzy A little headache just starting today - is a different kind of headache- between her eyes , not sharp pain  Neck feels ok  A lot of body pain in general - achey Went down to 10 mg of prednisone now - unsure if lupus is kicking up - doubts it   Is still very nauseated (she is taking zofran and prilosec)- is improved but still there -no vomiting at all  Some acid reflux also   Overall just a little better from when she left the hospital   No edema   Has f/u with hematology for her factor 5 disorder (hx of DVT)  Mother is staying with her tonight   Wt is down 10 lb   bp today is 100/70  Patient Active Problem List  Diagnosis  . HYPOTHYROIDISM, POSTABLATION  . HYPERCHOLESTEROLEMIA  . OBESITY  . ANXIETY  . PANIC DISORDER  . HYPERTENSION, ESSENTIAL NOS  . Hx of PE  . PLEURAL EFFUSION  . GLOMERULONEPHRITIS  . SYSTEMIC LUPUS ERYTHEMATOSUS  . EDEMA  . ENLARGEMENT OF LYMPH NODES  . DVT, HX OF  . Hyperglycemia  . OSA on CPAP  . Neck pain  . Viral gastroenteritis  . Dark urine  . Urine abnormality  . Dehydration  . Hypertensive emergency  . Chronic anticoagulation  . S/P bariatric surgery  . ICH, presumed hypertensive  . Factor V Leiden  . AKI (acute kidney injury)  . Orthostatic hypotension   Past Medical History  Diagnosis Date  . Nonspecific abnormal results of liver function study   . Personal history of venous  thrombosis and embolism 2002    during pregnancy; ?factor 5 leiden (sees heme)  . Edema   . 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 Nephritis and nephropathy, not specified as acute or chronic, with unspecified pathological lesion in kidney   . Pure hypercholesterolemia   . Unspecified essential hypertension   . Other specified acquired hypothyroidism   . Obesity, unspecified   . Panic disorder without agoraphobia   . Unspecified pleural effusion   . Other pulmonary embolism and infarction   . 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 Pleural effusion 2005    c/w lupus initial w/u; recurrent Right as pred tapered off July 2011  . HLD (hyperlipidemia)   . Iron deficiency anemia     IV dextran [place]  . 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 GERD (gastroesophageal reflux disease)     on prilosec r/t gastric sleeve surgery  . Arthritis     Lupus - hands/knees  . Anxiety state, unspecified     panic attacks  . Sleep apnea    Past Surgical History  Procedure Laterality Date  .  Pleuryx catheter placement  9/11     VanTright----removed 9/11  . Thoracentesis  2010    with penumonia  . Pleurocentesis  10/2004  . Gastric sleeve  8/12    bariatric surgery Dr Adolphus Birchwood   . Wisdom tooth extraction    . Svd      x 3  . Laparoscopic tubal ligation  12/09/2010    Procedure: LAPAROSCOPIC TUBAL LIGATION;  Surgeon: Juluis Mire;  Location: [facility];  Service: Gynecology;  Laterality: Bilateral;  Attempted see Nursing note   History  Substance Use Topics  . Smoking status: Former Smoker -- 0.50 packs/day for 10 years    Types: Cigarettes    Quit date: 05/04/2009  . Smokeless tobacco: Never Used     Comment: Socially x10 years (1 pack/month)  . Alcohol Use: Yes     Comment: socially   Family History  Problem Relation Age of Onset  . Lupus Sister   . Diabetes      GM   Allergies   Allergen Reactions  . Amoxicillin     REACTION: Pt. reports allergy, reaction not known  . Atorvastatin     REACTION: Reaction not known  . Moxifloxacin     REACTION: hallucinations   Current Outpatient Prescriptions on File Prior to Visit  Medication Sig Dispense Refill  . amLODipine (NORVASC) 10 MG tablet Take 1 tablet (10 mg total) by mouth daily.  30 tablet  0  . aspirin EC 81 MG EC tablet Take 1 tablet (81 mg total) by mouth daily.      Marland Kitchen labetalol (NORMODYNE) 200 MG tablet Take 1 tablet (200 mg total) by mouth 2 (two) times daily.  60 tablet  0  . levothyroxine (SYNTHROID, LEVOTHROID) 175 MCG tablet Take 1 tablet (175 mcg total) by mouth daily.  90 tablet  3  . Multiple Vitamin (MULTIVITAMIN) tablet Take 1 tablet by mouth daily.        Marland Kitchen omeprazole (PRILOSEC) 20 MG capsule Take 20 mg by mouth daily.      Marland Kitchen PARoxetine (PAXIL) 40 MG tablet take 1 tablet by mouth every morning  30 tablet  5  . predniSONE (DELTASONE) 10 MG tablet Take 20 mg by mouth daily.       . promethazine (PHENERGAN) 25 MG tablet Take 1 tablet (25 mg total) by mouth every 8 (eight) hours as needed for nausea.  20 tablet  0   No current facility-administered medications on file prior to visit.     Review of Systems Review of Systems  Constitutional: Negative for fever, appetite change, and unexpected weight change. pos for generalized exhaustion Eyes: Negative for pain and visual disturbance.  Respiratory: Negative for cough and shortness of breath.   Cardiovascular: Negative for cp or palpitations    Gastrointestinal: Negative for bowel changes abd pain or dark stool/ pos for constant nausea , neg for heartburn.  Genitourinary: Negative for urgency and frequency. neg for dysuria or hematuria  Skin: Negative for pallor or rash   Neurological: Negative for focal weakness/ facial droop/ speech difficulty/ pos for dizziness and mild headache  Hematological: Negative for adenopathy. Does  bruise/bleed easily.   Psychiatric/Behavioral: Negative for dysphoric mood. The patient is not nervous/anxious.         Objective:   Physical Exam  Constitutional: She appears well-developed and well-nourished. No distress.  Obese and fatigued appearing - with continued weight loss   HENT:  Head: Normocephalic and atraumatic.  Right Ear: External ear normal.  Left Ear: External ear normal.  Nose: Nose normal.  Mouth/Throat: Oropharynx is clear and moist.  No sinus tenderness  Eyes: Conjunctivae and EOM are normal. Pupils are equal, round, and reactive to light. Right eye exhibits no discharge. Left eye exhibits no discharge. No scleral icterus.  No nystagmus  Neck: Normal range of motion. Neck supple. No JVD present. Carotid bruit is not present. No thyromegaly present.  Cardiovascular: Normal rate, regular rhythm, normal heart sounds and intact distal pulses.  Exam reveals no gallop.   Orthostatic hypotension noted   Pulmonary/Chest: Effort normal and breath sounds normal. No respiratory distress. She has no wheezes. She has no rales.  No crackles   Abdominal: Soft. Bowel sounds are normal. She exhibits no distension, no abdominal bruit and no mass. There is no tenderness.  Musculoskeletal: Normal range of motion. She exhibits no edema and no tenderness.  Lymphadenopathy:    She has no cervical adenopathy.  Neurological: She is alert. She has normal reflexes. She displays no atrophy and no tremor. No cranial nerve deficit or sensory deficit. She exhibits normal muscle tone. Coordination and gait normal.  No focal cerebellar signs   Skin: Skin is warm and dry. No rash noted. No erythema. No pallor.  Brisk capillary refil and good turgor  Psychiatric: She has a normal mood and affect.          Assessment & Plan:

## 2012-05-04 NOTE — Assessment & Plan Note (Signed)
With recent hosp for hypertensive ICH - with amlodipine and labetolol  bp lying was 100/80 and sitting 80/60 This may be reason for malaise/ fatigue and dizziness Will cut both meds in 1/2 (and hold labetolol tonight)- pt will watch bp at home/ has someone staying with her and disc parameters to watch for and seek care if needed

## 2012-05-04 NOTE — Assessment & Plan Note (Signed)
Improved from her discharge however - she still has mild headache and nausea is still present  bp is low now - may be reason for malaise  Will check CT head w/o contrast to make sure this continues to resolve

## 2012-05-04 NOTE — Patient Instructions (Addendum)
Cut amlodipine in 1/2 and just take 1/2 pill daily  Cut labetolol in 1/2 and just take 1/2 pill twice daily (but skip tonight's dose since blood pressure is low)  Start checking blood pressure at home - alert me if under 100/80 or over 150/90  - or anything you have concerns about  Lab now  See Shirlee Limerick on the way out to schedule your CT scan  Be very careful changing positions because your blood pressure is low

## 2012-05-04 NOTE — Assessment & Plan Note (Addendum)
Pt has lupus glomerulonephritis - in setting of this cr rose further in hosp after dehydration and then improved again Stressed imp of fluid intake  Lab today

## 2012-05-04 NOTE — Assessment & Plan Note (Signed)
Holding ace for cr level  On amlodipine and labetolol- is orthostatic - will cut these both in 1/2 to amlodipine 5 and labetolol 100 bid  Close f/u / will check bp at home/ parameters given  Also checking lab today

## 2012-05-05 ENCOUNTER — Encounter: Payer: Self-pay | Admitting: *Deleted

## 2012-05-05 ENCOUNTER — Ambulatory Visit (INDEPENDENT_AMBULATORY_CARE_PROVIDER_SITE_OTHER)
Admission: RE | Admit: 2012-05-05 | Discharge: 2012-05-05 | Disposition: A | Discharge status: Home / Self Care | Payer: Managed Care, Other (non HMO) | Source: Ambulatory Visit | Attending: Family Medicine | Admitting: Family Medicine

## 2012-05-05 DIAGNOSIS — I619 Nontraumatic intracerebral hemorrhage, unspecified: Secondary | ICD-10-CM

## 2012-05-05 LAB — CBC WITH DIFFERENTIAL/PLATELET
Basophils Relative: 0 % (ref 0.0–3.0)
Eosinophils Absolute: 0 10*3/uL (ref 0.0–0.7)
Hemoglobin: 10.1 g/dL — ABNORMAL LOW (ref 12.0–15.0)
Lymphs Abs: 0.3 10*3/uL — ABNORMAL LOW (ref 0.7–4.0)
MCHC: 33.5 g/dL (ref 30.0–36.0)
MCV: 83.6 fl (ref 78.0–100.0)
Monocytes Absolute: 0.2 10*3/uL (ref 0.1–1.0)
Neutro Abs: 3.3 10*3/uL (ref 1.4–7.7)
RBC: 3.6 Mil/uL — ABNORMAL LOW (ref 3.87–5.11)

## 2012-05-10 ENCOUNTER — Other Ambulatory Visit: Payer: Self-pay | Admitting: *Deleted

## 2012-05-11 ENCOUNTER — Telehealth: Payer: Self-pay | Admitting: Oncology

## 2012-05-11 ENCOUNTER — Encounter: Payer: Self-pay | Admitting: Oncology

## 2012-05-11 ENCOUNTER — Ambulatory Visit: Payer: Managed Care, Other (non HMO)

## 2012-05-11 ENCOUNTER — Other Ambulatory Visit (HOSPITAL_BASED_OUTPATIENT_CLINIC_OR_DEPARTMENT_OTHER): Payer: Managed Care, Other (non HMO) | Admitting: Lab

## 2012-05-11 ENCOUNTER — Ambulatory Visit (HOSPITAL_BASED_OUTPATIENT_CLINIC_OR_DEPARTMENT_OTHER): Payer: Managed Care, Other (non HMO) | Admitting: Oncology

## 2012-05-11 ENCOUNTER — Other Ambulatory Visit: Payer: Managed Care, Other (non HMO) | Admitting: Lab

## 2012-05-11 VITALS — BP 126/85 | HR 81 | Temp 98.1°F | Resp 18 | Ht 65.0 in | Wt 264.8 lb

## 2012-05-11 DIAGNOSIS — Z7901 Long term (current) use of anticoagulants: Secondary | ICD-10-CM

## 2012-05-11 DIAGNOSIS — D6851 Activated protein C resistance: Secondary | ICD-10-CM

## 2012-05-11 DIAGNOSIS — I2699 Other pulmonary embolism without acute cor pulmonale: Secondary | ICD-10-CM

## 2012-05-11 DIAGNOSIS — I160 Hypertensive urgency: Secondary | ICD-10-CM

## 2012-05-11 DIAGNOSIS — M329 Systemic lupus erythematosus, unspecified: Secondary | ICD-10-CM

## 2012-05-11 DIAGNOSIS — Z86718 Personal history of other venous thrombosis and embolism: Secondary | ICD-10-CM

## 2012-05-11 DIAGNOSIS — N059 Unspecified nephritic syndrome with unspecified morphologic changes: Secondary | ICD-10-CM

## 2012-05-11 DIAGNOSIS — I619 Nontraumatic intracerebral hemorrhage, unspecified: Secondary | ICD-10-CM

## 2012-05-11 LAB — CBC & DIFF AND RETIC
Basophils Absolute: 0 10*3/uL (ref 0.0–0.1)
Eosinophils Absolute: 0 10*3/uL (ref 0.0–0.5)
HGB: 11.3 g/dL — ABNORMAL LOW (ref 11.6–15.9)
LYMPH%: 6.4 % — ABNORMAL LOW (ref 14.0–49.7)
MCV: 86.9 fL (ref 79.5–101.0)
MONO#: 0.3 10*3/uL (ref 0.1–0.9)
MONO%: 5.4 % (ref 0.0–14.0)
NEUT#: 4.3 10*3/uL (ref 1.5–6.5)
Platelets: 167 10*3/uL (ref 145–400)
Retic %: 1.07 % (ref 0.70–2.10)
Retic Ct Abs: 44.08 10*3/uL (ref 33.70–90.70)
WBC: 4.8 10*3/uL (ref 3.9–10.3)

## 2012-05-11 LAB — MORPHOLOGY: PLT EST: ADEQUATE

## 2012-05-11 LAB — CHCC SMEAR

## 2012-05-11 NOTE — Telephone Encounter (Signed)
gv and printed appt schedule for pt for April...sent pt back to the lab

## 2012-05-11 NOTE — Progress Notes (Signed)
Checked in patient for follow up. No financial issues.  °

## 2012-05-11 NOTE — Patient Instructions (Addendum)
Lab today I will discuss plan with your other doctors See you back on 4/22

## 2012-05-12 ENCOUNTER — Telehealth: Payer: Self-pay | Admitting: Critical Care Medicine

## 2012-05-12 LAB — LUPUS ANTICOAGULANT PANEL: DRVVT: 30.3 secs (ref <42.9)

## 2012-05-12 LAB — BETA-2 GLYCOPROTEIN ANTIBODIES: Beta-2-Glycoprotein I IgA: 26 A Units — ABNORMAL HIGH (ref <20)

## 2012-05-12 LAB — CARDIOLIPIN ANTIBODIES, IGG, IGM, IGA
Anticardiolipin IgA: 8 APL U/mL (ref <22)
Anticardiolipin IgM: 9 MPL U/mL (ref <11)

## 2012-05-12 NOTE — Progress Notes (Signed)
New Patient Hematology-Oncology Evaluation   [name] [id number] 1965/09/10 46 y.o. 05/12/2012  CC: Dr. Roxy Manns; Dr. Stacey Drain; Dr. Shan Levans   Reason for referral: Advice on anticoagulation   HPI: New patient evaluation for this extremely complicated 47 year old woman diagnosed with lupus in 1996. She sustained a right lower extremity DVT about 20 years ago 4 weeks postpartum after her first pregnancy. She did not require a C-section. She was anticoagulated for about a year and a half. 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 Of note, previous testing for presence of a lupus-type anticoagulant and antibodies to beta 2 glycoprotein 1 as well as anticardiolipin antibodies has been negative, most recently tested in March 2010. Protein S. and protein C activities both normal at that time as well. She tests negative for the prothrombin gene mutation.  She was covered with Lovenox injections for her second and third pregnancies. She had a placental abruption with the second pregnancy.  She had an unprovoked pulmonary embolus in 2010. She was put on chronic Coumadin anticoagulation at that time. About one year later she elected to have a bariatric surgical procedure for obesity-gastric sleeve procedure. A prophylactic vena cava filter was placed at that time.  She has a number of chronic pulmonary issues. About 4 years ago she underwent a surgical drainage procedure for a left empyema. About one year later she developed right pleural effusion and pericardial effusion likely related to her lupus. She underwent chest tube drainage and a talc pleurodesis procedure.  She was admitted to the hospital between March 19 and 04/29/2012 with a one-month history of persistent, severe, unexplained headaches. She was found to have a hypertensive crisis with blood pressure 220/120 and brain scan showed a small  intraventricular hemorrhage. INR on admission noted to be supratherapeutic at 4.4. Anticoagulation was reversed. She was sent home on 81 mg of aspirin daily.  While she was in the hospital her platelet count fell to 96,000 but has now recovered to normal and was 160,000 on April 1.  She is on chronic prednisone for her lupus. Dose had to be increased from her baseline of 5 mg up to 20 mg recently due to a flare of polyarthralgia.  She has developed glomerulonephritis related to her lupus and is being followed by nephrology. Most recent creatinine clearance 64 mL per minute on 04/25/2012. 850 mg of protein in the 24-hour urine collection. But estimated GFR today is only 25 mL per minute with serum creatinine 2.3.  She remains obese despite previous bariatric surgical procedure. She does not smoke.  She has a 41 year old sister with lupus. A 52 year old sister who had acute leukemia as a child currently in remission. A 40 year old brother who is alive and healthy. Both parents are still alive and well. She has 3 children a son 79, a son 35, a daughter age 39 who are all healthy.      PMH: Past Medical History  Diagnosis Date  . Nonspecific abnormal results of liver function study   . Personal history of venous thrombosis and embolism 2002    during pregnancy; ?factor 5 leiden (sees heme)  . Edema   . 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 Nephritis and nephropathy, not specified as acute or chronic, with unspecified pathological lesion in kidney   . Pure hypercholesterolemia   . Unspecified essential  hypertension   . Other specified acquired hypothyroidism   . Obesity, unspecified   . Panic disorder without agoraphobia   . Unspecified pleural effusion   . Other pulmonary embolism and infarction   . 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 Pleural effusion 2005    c/w lupus initial w/u;  recurrent Right as pred tapered off July 2011  . HLD (hyperlipidemia)   . Iron deficiency anemia     IV dextran [place]  . 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 GERD (gastroesophageal reflux disease)     on prilosec r/t gastric sleeve surgery  . Arthritis     Lupus - hands/knees  . Anxiety state, unspecified     panic attacks  . Sleep apnea   No history of hepatitis, yellow jaundice, malaria, tuberculosis, seizure, ulcers, diabetes, she is hypothyroid on replacement.  Past Surgical History  Procedure Laterality Date  . Pleuryx catheter placement  9/11     VanTright----removed 9/11  . Thoracentesis  2010    with penumonia  . Pleurocentesis  10/2004  . Gastric sleeve  8/12    bariatric surgery Dr Adolphus Birchwood   . Wisdom tooth extraction    . Svd      x 3  . Laparoscopic tubal ligation  12/09/2010    Procedure: LAPAROSCOPIC TUBAL LIGATION;  Surgeon: Juluis Mire;  Location: [facility];  Service: Gynecology;  Laterality: Bilateral;  Attempted see Nursing note  Previous endometrial ablation for menorrhagia  Allergies: Allergies  Allergen Reactions  . Amoxicillin     REACTION: Pt. reports allergy, reaction not known  . Atorvastatin     REACTION: Reaction not known  . Moxifloxacin     REACTION: hallucinations    Medications:  Social History: Married with 3 children. She works for a [employer] under[employer].  reports that she quit smoking about 3 years ago.  She has a 5 pack-year smoking history.  smokeless tobacco. She reports that  drinks alcohol rarely. She reports that she does not use illicit drugs.  Family History: See above Family History  Problem Relation Age of Onset  . Lupus Sister   . Diabetes      GM    Review of Systems: Constitutional symptoms: No constitutional symptoms HEENT: No sore throat Respiratory: She has been having intermittent right-sided pleuritic chest discomfort Cardiovascular:  No chest pain, palpitations, or chest pressure Gastrointestinal  ROS: No abdominal pain or change in bowel habit Genito-Urinary ROS: No urinary tract symptoms. Hematological and Lymphatic: Musculoskeletal: Recent flareup of polymyalgia rheumatica her lupus Neurologic: See above. Severe headaches have resolved. She is getting an occasional mild headache relieved with Tylenol. She did not suffer any neurologic deficits from the recent intracranial hemorrhage Dermatologic: No rash or ecchymosis Remaining ROS negative.  Physical Exam: Blood pressure 126/85, pulse 81, temperature 98.1 F (36.7 C), temperature source Oral, resp. rate 18, height 5\' 5"  (1.651 m), weight 264 lb 12.8 oz (120.112 kg). Wt Readings from Last 3 Encounters:  05/11/12 264 lb 12.8 oz (120.112 kg)  05/04/12 270 lb 8 oz (122.698 kg)  04/22/12 279 lb 15.8 oz (127 kg)    General appearance: Pleasant, obese, Caucasian woman Head: Normal Neck: Full range of motion Lymph nodes: No adenopathy Breasts: Lungs: Clear to auscultation resonant to percussion Heart: Regular rhythm no murmur or gallop Abdominal: Soft, obese, nontender, no mass, no organomegaly GU: Extremities: No edema, no calf  tenderness Neurologic: Mental status intact, some problems with memory, PERRLA, optic discs poorly visualized due to probable early cataracts, cranial nerves grossly normal, motor strength is 5 over 5, reflexes 1+ symmetric, sensation intact to vibration over the fingertips by tuning fork exam, Skin: No rash or ecchymosis    Lab Results: Lab Results: White count differential: 88% neutrophils, 6% lymphocytes, 5% monocytes   Component Value Date   WBC 4.8 05/11/2012   HGB 11.3* 05/11/2012   HCT 35.8 05/11/2012   MCV 86.9 05/11/2012   PLT 167 05/11/2012     Chemistry      Component Value Date/Time   NA 138 05/04/2012 1447   K 4.7 05/04/2012 1447   CL 106 05/04/2012 1447   CO2 26 05/04/2012 1447   BUN 22 05/04/2012 1447   CREATININE 2.3* 05/04/2012 1447   CREATININE 1.97* 04/25/2012 0800      Component Value  Date/Time   CALCIUM 7.8* 05/04/2012 1447   ALKPHOS 112 05/04/2012 1447   AST 36 05/04/2012 1447   ALT 32 05/04/2012 1447   BILITOT 0.6 05/04/2012 1447     D.-dimer: Markedly elevated at 12.24 done today 05/11/2012 GFR calculated as 25 mL per minute with current creatinine 2.3   Impression and Plan: Complex lady who has already suffered 2 major thrombotic events, has ongoing major risk factors for recurrent emboli including the prior history of recurrent thrombosis, chronic inflammation related to lupus, homozygote status for  factor V Leiden gene mutation, chronic renal insufficiency, persistent significant elevation of d-dimer, and elevated BMI.  Recommendation: Despite recent intracranial hemorrhage, this woman needs to be on chronic warfarin anticoagulation. She is not a good candidate for the new oral anticoagulants since they are all partially cleared by the kidneys. We could potentially use Apixiban which is only 25% metabolized through the kidneys  but, doses of none of these agents  have  been standardized for people of low or high weight. Last but not least, we still do not have any reversal agents. I would stop her aspirin and put her back on Coumadin with close control. I would  get her a home monitor.     Levert Feinstein, MD 05/12/2012, 10:45 AM

## 2012-05-12 NOTE — Telephone Encounter (Signed)
I spoke with Dr. Cyndie Chime. I advised him Dr. Delford Field was not in today. He asked if Dr. Delford Field would please call him when he is free. Please advise Dr. Delford Field thanks

## 2012-05-13 ENCOUNTER — Telehealth: Payer: Self-pay | Admitting: *Deleted

## 2012-05-13 NOTE — Telephone Encounter (Signed)
Received following msg from Dr. Delford Field:      Message     Rosanne Ashing    I totally agree she needs to be either on apixaban or coumadin again        Coumadin has been a challenge with drug interactions. Would suggest tight control with home protime monitoring Keeping INR 2-3 better to be in 2- 2.5 range    Also totally agree NO ASA with the coumadin.        Pat        Anneke Cundy ms [place] needs OV with [place]        pw    ----- Message -----    From: Levert Feinstein, MD    Sent: 05/12/2012 11:29 AM    To: Judy Pimple, MD, Storm Frisk, MD        Please see my new patient evaluation.    I strongly feel this lady needs to be back on Coumadin with type laboratory monitoring and stop the aspirin.    She might benefit by a home Coumadin monitor.    I'm happy to discuss this with you. My cell phone is [phone].    -----  lmomtcb to schedule pt OV with [place].

## 2012-05-13 NOTE — Telephone Encounter (Signed)
I sent dr Cyndie Chime a msg in epic

## 2012-05-14 ENCOUNTER — Telehealth: Payer: Self-pay | Admitting: General Practice

## 2012-05-14 ENCOUNTER — Encounter: Payer: Self-pay | Admitting: Oncology

## 2012-05-14 ENCOUNTER — Telehealth: Payer: Self-pay | Admitting: Family Medicine

## 2012-05-14 NOTE — Telephone Encounter (Signed)
Forms faxed for [employer] and a copy mailed to pt per pt request

## 2012-05-14 NOTE — Telephone Encounter (Signed)
Spoke with patient and gave dosing instruction for coumadin.  Patient will have INR checked at the [facility] office on Wednesday 4/16.

## 2012-05-14 NOTE — Progress Notes (Signed)
I was in touch with her primary care physician Dr. Milinda Antis and her pulmonologist Dr. Delford Field. We are all in agreement that Mrs. [name]. should go back on full dose anticoagulation. My preference his Coumadin at this time. I notified the patient by phone this evening. She will stop her aspirin. Go back on her previous dose of Coumadin. She will call Dr. Royden Purl office on Monday to make arrangements for lab testing.

## 2012-05-14 NOTE — Telephone Encounter (Signed)
Left voicemail letting pt know [employer] disability forms are ready, and does she want [place] to fax them somewhere or is she picking them up, will try to call back later

## 2012-05-14 NOTE — Telephone Encounter (Signed)
Pt left vm requesting that [place] fax paperwork to fax# on bottom of [employer] paperwork.  She requests notification when this is done.

## 2012-05-14 NOTE — Telephone Encounter (Signed)
Message copied by Garrison Columbus on Fri May 14, 2012  4:13 PM ------      Message from: Roxy Manns A      Created: Fri May 14, 2012  3:38 PM       That sounds great, thanks      ----- Message -----         From: Garrison Columbus, RN         Sent: 05/14/2012   3:13 PM           To: Judy Pimple, MD            I have not heard from Dr. Cyndie Chime.  I spoke with Weston Brass (the pharmacist) at [facility].  She didn't see any need for the lovenox given that she has only been off for a few weeks and does not have any new clots.  I called patient and dosed her.  She will be seeing me at the [facility] office next Wednesday.      ----- Message -----         From: Judy Pimple, MD         Sent: 05/14/2012  11:39 AM           To: Garrison Columbus, RN            I let Dr Marlena Clipper know when I talked to him that in the past we have had a very difficult time reaching pt by phone- especially about labs - so a home monitoring kit may become tricky      ----- Message -----         From: Garrison Columbus, RN         Sent: 05/14/2012   8:29 AM           To: Judy Pimple, MD            I will call her today and start on Lovenox and coumadin. Do you want to have her order a home monitoring kit? Or can she come to clinic??  I just saw the note from Dr. Cyndie Chime recommending a home coag machine.  What do you think?  Arline Asp      ----- Message -----         From: Judy Pimple, MD         Sent: 05/13/2012   2:20 PM           To: Garrison Columbus, RN            Arline Asp- this pt needs to get back to the coumadin clinic-she was off coumadin after an intercranial hemorrhage but needs to re start now       thanks      ----- Message -----         From: Levert Feinstein, MD         Sent: 05/13/2012  12:42 PM           To: Judy Pimple, MD            Dr Milinda Antis - I discussed Mrs Massingale w Dr Delford Field - he agrees we should stop ASA and put her back on coumadin.      Please get her back into your coumadin clinic.  I will call pt and let her know.      Thanks      Riley Churches                               ------

## 2012-05-17 ENCOUNTER — Telehealth: Payer: Self-pay

## 2012-05-17 ENCOUNTER — Telehealth: Payer: Self-pay | Admitting: Dietician

## 2012-05-17 ENCOUNTER — Telehealth: Payer: Self-pay | Admitting: General Practice

## 2012-05-17 NOTE — Telephone Encounter (Signed)
Called, spoke with pt. We have scheduled her to see Dr. Delford Field on Thursday, April 17 at 11:15 am in [place]. Pt aware and voiced no further questions or concerns at this time.

## 2012-05-17 NOTE — Telephone Encounter (Signed)
Please call patient to clarify those questions-thanks

## 2012-05-17 NOTE — Telephone Encounter (Signed)
[name] with [employer] received form faxed and [name] needs clarification advised to be out of work on 04/19/12 but appears pt seen on 04/16/12;  Should pt be out of work from 04/16/12 instead of 04/19/12; also needs specific problems or symptoms that kept pt from working; and has pt been released to return to work.

## 2012-05-17 NOTE — Telephone Encounter (Signed)
Message copied by Garrison Columbus on Mon May 17, 2012  8:22 AM ------      Message from: Levert Feinstein      Created: Fri May 14, 2012  6:57 PM       No - just stop ASA.  She needs close monitoring. Compliance issues in past per Dr Milinda Antis. INR 4.4 when she presented with a hypertensive crisis and a limited (thank God) intracerebral bleed.  I would get her a home monitor as an adjunct to office monitoring.      Thanks DrG      ----- Message -----         From: Garrison Columbus, RN         Sent: 05/14/2012   8:46 AM           To: Levert Feinstein, MD            Good morning,  I got the note that this patient needs to resume coumadin.  Does pt need a lovenox bridge?  Thanks!            Bailey Mech, RN      [facility]in [facility]       ------

## 2012-05-19 ENCOUNTER — Other Ambulatory Visit (HOSPITAL_COMMUNITY): Payer: Self-pay | Admitting: Nephrology

## 2012-05-19 ENCOUNTER — Other Ambulatory Visit (INDEPENDENT_AMBULATORY_CARE_PROVIDER_SITE_OTHER): Payer: Managed Care, Other (non HMO)

## 2012-05-19 ENCOUNTER — Ambulatory Visit (INDEPENDENT_AMBULATORY_CARE_PROVIDER_SITE_OTHER): Payer: Managed Care, Other (non HMO) | Admitting: General Practice

## 2012-05-19 DIAGNOSIS — Z7902 Long term (current) use of antithrombotics/antiplatelets: Secondary | ICD-10-CM

## 2012-05-19 DIAGNOSIS — Z86718 Personal history of other venous thrombosis and embolism: Secondary | ICD-10-CM

## 2012-05-19 DIAGNOSIS — I2699 Other pulmonary embolism without acute cor pulmonale: Secondary | ICD-10-CM

## 2012-05-19 DIAGNOSIS — Z5181 Encounter for therapeutic drug level monitoring: Secondary | ICD-10-CM

## 2012-05-19 DIAGNOSIS — R809 Proteinuria, unspecified: Secondary | ICD-10-CM

## 2012-05-20 ENCOUNTER — Ambulatory Visit: Payer: Managed Care, Other (non HMO) | Admitting: Critical Care Medicine

## 2012-05-20 NOTE — Telephone Encounter (Signed)
[employer] just need correct start date, so I confirmed with pt that it was 04/16/12 and then I verified with [employer] that the correct start date was 04/16/12 and they updated it in their system

## 2012-05-24 ENCOUNTER — Encounter: Payer: Self-pay | Admitting: Lab

## 2012-05-25 ENCOUNTER — Encounter: Payer: Self-pay | Admitting: Oncology

## 2012-05-25 ENCOUNTER — Other Ambulatory Visit: Payer: Managed Care, Other (non HMO) | Admitting: Lab

## 2012-05-25 ENCOUNTER — Ambulatory Visit: Payer: Managed Care, Other (non HMO) | Admitting: Oncology

## 2012-05-25 NOTE — Progress Notes (Signed)
The patient failed to report for today's visit.  Complicated 47 year old woman with lupus with many associated complications. Although she tests negative for the presence of a lupus-type anticoagulant, she has had multiple recurrent thrombotic events including pulmonary embolus, lower extremity venous thrombosis, and previous history of placement of a prophylactic vena cava filter. She had a recent hospital admission for what appeared to be a hypertensive crisis. She was on Coumadin with a supratherapeutic INR of 4.4. She was found to have a small intraventricular cerebral hemorrhage. Please see my office consultation dated 05/11/2012 for complete details.  I felt that she was at very high risk for recurrent thrombotic events and that she should be back on anticoagulation. I discussed this with her primary care physician and her pulmonary critical care physician who were in agreement. She was advised to go back on Coumadin. Coumadin will be monitored through her primary care physician's office.  I will be happy to see her again in the future if the need arises.

## 2012-05-26 ENCOUNTER — Ambulatory Visit (INDEPENDENT_AMBULATORY_CARE_PROVIDER_SITE_OTHER): Payer: Managed Care, Other (non HMO) | Admitting: General Practice

## 2012-05-26 ENCOUNTER — Telehealth: Payer: Self-pay | Admitting: *Deleted

## 2012-05-26 ENCOUNTER — Telehealth: Payer: Self-pay | Admitting: General Practice

## 2012-05-26 ENCOUNTER — Other Ambulatory Visit (INDEPENDENT_AMBULATORY_CARE_PROVIDER_SITE_OTHER): Payer: Managed Care, Other (non HMO)

## 2012-05-26 ENCOUNTER — Encounter (HOSPITAL_COMMUNITY): Payer: Self-pay

## 2012-05-26 DIAGNOSIS — Z7902 Long term (current) use of antithrombotics/antiplatelets: Secondary | ICD-10-CM

## 2012-05-26 DIAGNOSIS — Z5181 Encounter for therapeutic drug level monitoring: Secondary | ICD-10-CM

## 2012-05-26 DIAGNOSIS — I2699 Other pulmonary embolism without acute cor pulmonale: Secondary | ICD-10-CM

## 2012-05-26 DIAGNOSIS — Z86718 Personal history of other venous thrombosis and embolism: Secondary | ICD-10-CM

## 2012-05-26 LAB — PROTIME-INR: Prothrombin Time: 81.6 s (ref 10.2–12.4)

## 2012-05-26 NOTE — Telephone Encounter (Signed)
Received a call critical labs PT 81.6, Inr 8.03

## 2012-05-27 ENCOUNTER — Other Ambulatory Visit: Payer: Self-pay | Admitting: Radiology

## 2012-05-28 ENCOUNTER — Other Ambulatory Visit: Payer: Self-pay | Admitting: Critical Care Medicine

## 2012-05-28 ENCOUNTER — Telehealth: Payer: Self-pay | Admitting: Critical Care Medicine

## 2012-05-28 ENCOUNTER — Telehealth: Payer: Self-pay | Admitting: *Deleted

## 2012-05-28 NOTE — Telephone Encounter (Signed)
Received vm call from pt stating that she wa sick 05/25/12 & was unable to get to MD appt but wants to know if Dr. Cyndie Chime could see her soon.  Returned call & informed that I would discuss with Dr.Granfortuna.

## 2012-05-28 NOTE — Telephone Encounter (Signed)
i am ok with pred refill and to taper back to 5mg  /d but remind pt dr tower, not pulm is managing her lupus arthritis

## 2012-05-28 NOTE — Telephone Encounter (Signed)
I received following msg below from PW: ----- Message -----  From: Storm Frisk, MD Sent: 05/26/2012 11:21 AM To: Raford Pitcher, RN  This pt needs ROV with me rescheduled, she no showed with me and dr Cyndie Chime last week /this week pw ------- She has a pending OV with Dr. Delford Field on May 5th in [place] at 11 am. I was calling to remind pt of this appt and ask she pls keep it  I spoke with pt.  She is aware of her appt with PW on May 5 in [place] and does plan to keep this.  Pt states Dr. Milinda Antis increased her prednisone to 20 mg qd because she was having joint pains.  These pain have now stopped.  The pt would like to decrease the prednisone now back to 5 mg qd.  She would like to know if PW thinks this will be ok from a pulmonary standpoint.  She will need a pred rx and would like the 10 mg tablets.  [facility] [place].  Dr. Delford Field, pls advise.  Thank you.  Note:  Pt aware she will not get a call back until Monday as PW is off this evening.  She is ok with this and has enough prednisone to last through the weekend.

## 2012-05-31 ENCOUNTER — Encounter (HOSPITAL_COMMUNITY)
Admission: RE | Admit: 2012-05-31 | Discharge: 2012-05-31 | Disposition: A | Discharge status: Home / Self Care | Payer: Managed Care, Other (non HMO) | Source: Ambulatory Visit | Attending: Nephrology | Admitting: Nephrology

## 2012-05-31 ENCOUNTER — Ambulatory Visit (INDEPENDENT_AMBULATORY_CARE_PROVIDER_SITE_OTHER): Payer: Managed Care, Other (non HMO) | Admitting: General Practice

## 2012-05-31 ENCOUNTER — Other Ambulatory Visit: Payer: Self-pay | Admitting: Oncology

## 2012-05-31 DIAGNOSIS — D6851 Activated protein C resistance: Secondary | ICD-10-CM

## 2012-05-31 DIAGNOSIS — Z86718 Personal history of other venous thrombosis and embolism: Secondary | ICD-10-CM

## 2012-05-31 DIAGNOSIS — Z7901 Long term (current) use of anticoagulants: Secondary | ICD-10-CM

## 2012-05-31 LAB — CBC WITH DIFFERENTIAL/PLATELET
Basophils Absolute: 0 10*3/uL (ref 0.0–0.1)
Eosinophils Absolute: 0.1 10*3/uL (ref 0.0–0.7)
HCT: 29.6 % — ABNORMAL LOW (ref 36.0–46.0)
Lymphs Abs: 0.7 10*3/uL (ref 0.7–4.0)
MCHC: 32.8 g/dL (ref 30.0–36.0)
MCV: 84.1 fL (ref 78.0–100.0)
Neutro Abs: 1.9 10*3/uL (ref 1.7–7.7)
RDW: 15.3 % (ref 11.5–15.5)

## 2012-05-31 LAB — COMPREHENSIVE METABOLIC PANEL
AST: 47 U/L — ABNORMAL HIGH (ref 0–37)
Albumin: 2.7 g/dL — ABNORMAL LOW (ref 3.5–5.2)
Calcium: 8.1 mg/dL — ABNORMAL LOW (ref 8.4–10.5)
Creatinine, Ser: 1.3 mg/dL — ABNORMAL HIGH (ref 0.50–1.10)
Total Protein: 5.8 g/dL — ABNORMAL LOW (ref 6.0–8.3)

## 2012-05-31 LAB — APTT: aPTT: 40 seconds — ABNORMAL HIGH (ref 24–37)

## 2012-05-31 LAB — PROTIME-INR: INR: 2.65 — ABNORMAL HIGH (ref 0.00–1.49)

## 2012-05-31 LAB — POCT INR: INR: 3.6

## 2012-05-31 MED ORDER — PREDNISONE 10 MG PO TABS: 10.0000 mg | ORAL_TABLET | Freq: Every day | ORAL | Status: DC

## 2012-05-31 NOTE — Telephone Encounter (Signed)
Opened in error

## 2012-05-31 NOTE — Telephone Encounter (Signed)
Refill sent. Pt is aware. Jennifer Castillo, CMA  

## 2012-06-01 ENCOUNTER — Encounter (HOSPITAL_COMMUNITY)
Admission: RE | Admit: 2012-06-01 | Discharge: 2012-06-01 | Disposition: A | Discharge status: Home / Self Care | Payer: Managed Care, Other (non HMO) | Source: Ambulatory Visit | Attending: Nephrology | Admitting: Nephrology

## 2012-06-01 ENCOUNTER — Telehealth: Payer: Self-pay | Admitting: Oncology

## 2012-06-01 LAB — PROTIME-INR: Prothrombin Time: 20.7 seconds — ABNORMAL HIGH (ref 11.6–15.2)

## 2012-06-01 NOTE — Telephone Encounter (Signed)
lvm for pt regarding to 5.1.14 appt.Marland KitchenMarland Kitchen

## 2012-06-02 ENCOUNTER — Ambulatory Visit (HOSPITAL_COMMUNITY)
Admission: RE | Admit: 2012-06-02 | Discharge: 2012-06-02 | Disposition: A | Discharge status: Home / Self Care | Payer: Managed Care, Other (non HMO) | Source: Ambulatory Visit | Attending: Nephrology | Admitting: Nephrology

## 2012-06-02 DIAGNOSIS — F410 Panic disorder [episodic paroxysmal anxiety] without agoraphobia: Secondary | ICD-10-CM | POA: Insufficient documentation

## 2012-06-02 DIAGNOSIS — I10 Essential (primary) hypertension: Secondary | ICD-10-CM | POA: Insufficient documentation

## 2012-06-02 DIAGNOSIS — E039 Hypothyroidism, unspecified: Secondary | ICD-10-CM | POA: Insufficient documentation

## 2012-06-02 DIAGNOSIS — D6859 Other primary thrombophilia: Secondary | ICD-10-CM | POA: Insufficient documentation

## 2012-06-02 DIAGNOSIS — R799 Abnormal finding of blood chemistry, unspecified: Secondary | ICD-10-CM | POA: Insufficient documentation

## 2012-06-02 DIAGNOSIS — E7800 Pure hypercholesterolemia, unspecified: Secondary | ICD-10-CM | POA: Insufficient documentation

## 2012-06-02 DIAGNOSIS — N179 Acute kidney failure, unspecified: Secondary | ICD-10-CM | POA: Insufficient documentation

## 2012-06-02 DIAGNOSIS — R809 Proteinuria, unspecified: Secondary | ICD-10-CM

## 2012-06-02 DIAGNOSIS — R710 Precipitous drop in hematocrit: Secondary | ICD-10-CM | POA: Insufficient documentation

## 2012-06-02 DIAGNOSIS — Z5309 Procedure and treatment not carried out because of other contraindication: Secondary | ICD-10-CM | POA: Insufficient documentation

## 2012-06-02 DIAGNOSIS — E669 Obesity, unspecified: Secondary | ICD-10-CM | POA: Insufficient documentation

## 2012-06-02 DIAGNOSIS — M329 Systemic lupus erythematosus, unspecified: Secondary | ICD-10-CM | POA: Insufficient documentation

## 2012-06-02 DIAGNOSIS — Z86718 Personal history of other venous thrombosis and embolism: Secondary | ICD-10-CM | POA: Insufficient documentation

## 2012-06-02 LAB — CBC
MCV: 85.1 fL (ref 78.0–100.0)
Platelets: 100 10*3/uL — ABNORMAL LOW (ref 150–400)
RBC: 3.42 MIL/uL — ABNORMAL LOW (ref 3.87–5.11)
WBC: 4.4 10*3/uL (ref 4.0–10.5)

## 2012-06-02 LAB — PROTIME-INR: INR: 1.5 — ABNORMAL HIGH (ref 0.00–1.49)

## 2012-06-02 MED ORDER — SODIUM CHLORIDE 0.9 % IV SOLN
Freq: Once | INTRAVENOUS | Status: AC
  Administered 2012-06-02: 12:00:00 via INTRAVENOUS

## 2012-06-02 NOTE — H&P (Signed)
[name] is an 47 y.o. female.  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  Pt admitted for renal biopsy in light of low complements, active urine sediment, and AKI.    Past Medical History  Diagnosis Date  . Nonspecific abnormal results of liver function study   . Personal history of venous thrombosis and embolism 2002    during pregnancy; ?factor 5 leiden (sees heme)  . Edema   . 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 Nephritis and nephropathy, not specified as acute or chronic, with unspecified pathological lesion in kidney   . Pure hypercholesterolemia   . Unspecified essential hypertension   . Other specified acquired hypothyroidism   . Obesity, unspecified   . Panic disorder without agoraphobia   . Unspecified pleural effusion   . Other pulmonary embolism and infarction   . 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 Pleural effusion 2005    c/w lupus initial w/u; recurrent Right as pred tapered off July 2011  . HLD (hyperlipidemia)   . Iron deficiency anemia     IV dextran Wirt Hemmerich  . 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 GERD (gastroesophageal reflux disease)     on prilosec r/t gastric sleeve surgery  . Arthritis     Lupus - hands/knees  . Anxiety state, unspecified     panic attacks  . Sleep apnea     Allergies:  Allergies  Allergen Reactions  . Amoxicillin     REACTION: Pt. reports allergy, reaction not known  . Atorvastatin     REACTION: Reaction not known  . Moxifloxacin     REACTION: hallucinations    Medications: {medication reviewed/display:[id number] Results for orders placed during the hospital encounter of 06/02/12 (from the past 48 hour(s))  APTT      Status: None   Collection Time    06/02/12  1:00 PM      Result Value Range   aPTT 27  24 - 37 seconds  CBC     Status: Abnormal   Collection Time    06/02/12  1:00 PM      Result Value Range   WBC 4.4  4.0 - 10.5 K/uL   RBC 3.42 (*) 3.87 - 5.11 MIL/uL   Hemoglobin 9.5 (*) 12.0 - 15.0 g/dL   HCT 40.9 (*) 81.1 - 91.4 %   MCV 85.1  78.0 - 100.0 fL   MCH 27.8  26.0 - 34.0 pg   MCHC 32.6  30.0 - 36.0 g/dL   RDW 78.2  95.6 - 21.3 %   Platelets 100 (*) 150 - 400 K/uL   Comment: CONSISTENT WITH PREVIOUS RESULT  PROTIME-INR     Status: Abnormal   Collection Time    06/02/12  1:00 PM      Result Value Range   Prothrombin Time 17.7 (*) 11.6 - 15.2 seconds   INR 1.50 (*) 0.00 - 1.49    No results found.  Blood pressure 108/71, pulse 56, resp. rate 18, SpO2 100.00%. General appearance: alert, cooperative and no distress Head: Normocephalic, without obvious abnormality, atraumatic Eyes: negative Resp: clear to auscultation bilaterally Cardio: regular rate and  rhythm, S1, S2 normal, no murmur, click, rub or gallop GI: soft, non-tender; bowel sounds normal; no masses,  no organomegaly Extremities: edema trace pretib edema  Assessment/Plan: 1 AKI/active urine sediment in pt with SLE.  Pt was to undergo renal biopsy however her platelet count has been dropping and is now down to 100K and INR is still elevated at 1.5.  She also had a drop in Hgb to 9.8.  Will cancel biopsy for now as her Scr is improved at 1.3.  Will start cellcept 500mg  bid empirically and recheck labs next week to reschedule biopsy.  Will proceed when plt count improves and Hgb stabilizes.  Pt to resume coumadin today.    [name] A 06/02/2012, 1:32 PM

## 2012-06-03 ENCOUNTER — Telehealth: Payer: Self-pay | Admitting: Family Medicine

## 2012-06-03 ENCOUNTER — Telehealth: Payer: Self-pay | Admitting: Oncology

## 2012-06-03 ENCOUNTER — Other Ambulatory Visit (HOSPITAL_BASED_OUTPATIENT_CLINIC_OR_DEPARTMENT_OTHER): Payer: Managed Care, Other (non HMO) | Admitting: Lab

## 2012-06-03 ENCOUNTER — Ambulatory Visit (HOSPITAL_BASED_OUTPATIENT_CLINIC_OR_DEPARTMENT_OTHER): Payer: Managed Care, Other (non HMO) | Admitting: Oncology

## 2012-06-03 VITALS — BP 116/74 | HR 80 | Temp 98.1°F | Resp 18 | Ht 65.0 in | Wt 259.4 lb

## 2012-06-03 DIAGNOSIS — Z86718 Personal history of other venous thrombosis and embolism: Secondary | ICD-10-CM

## 2012-06-03 DIAGNOSIS — D6859 Other primary thrombophilia: Secondary | ICD-10-CM

## 2012-06-03 DIAGNOSIS — Z7901 Long term (current) use of anticoagulants: Secondary | ICD-10-CM

## 2012-06-03 DIAGNOSIS — D6851 Activated protein C resistance: Secondary | ICD-10-CM

## 2012-06-03 DIAGNOSIS — M329 Systemic lupus erythematosus, unspecified: Secondary | ICD-10-CM

## 2012-06-03 DIAGNOSIS — I619 Nontraumatic intracerebral hemorrhage, unspecified: Secondary | ICD-10-CM

## 2012-06-03 DIAGNOSIS — N059 Unspecified nephritic syndrome with unspecified morphologic changes: Secondary | ICD-10-CM

## 2012-06-03 LAB — PROTIME-INR
INR: 1.6 — ABNORMAL LOW (ref 2.00–3.50)
Protime: 19.2 Seconds — ABNORMAL HIGH (ref 10.6–13.4)

## 2012-06-03 LAB — CBC & DIFF AND RETIC
Basophils Absolute: 0 10*3/uL (ref 0.0–0.1)
Eosinophils Absolute: 0.1 10*3/uL (ref 0.0–0.5)
HGB: 8.8 g/dL — ABNORMAL LOW (ref 11.6–15.9)
Immature Retic Fract: 11.9 % — ABNORMAL HIGH (ref 1.60–10.00)
MCV: 87.2 fL (ref 79.5–101.0)
MONO#: 0.3 10*3/uL (ref 0.1–0.9)
NEUT#: 2.9 10*3/uL (ref 1.5–6.5)
RDW: 15.2 % — ABNORMAL HIGH (ref 11.2–14.5)
Retic %: 1.88 % (ref 0.70–2.10)
Retic Ct Abs: 60.35 10*3/uL (ref 33.70–90.70)
lymph#: 0.7 10*3/uL — ABNORMAL LOW (ref 0.9–3.3)

## 2012-06-03 NOTE — Telephone Encounter (Signed)
Stop the amlodipine entirely - and let me know how that works out (please take it off her med list, thanks)

## 2012-06-03 NOTE — Telephone Encounter (Signed)
Patient Information:  Caller Name: Sandeep  Phone: [phone]  Patient: Cheryl, Price  Gender: Female  DOB: Jun 03, 1965  Age: 47 Years  PCP: Tower, [employer] [facility])  Pregnant: No  Office Follow Up:  Does the office need to follow up with this patient?: Yes  Instructions For The Office: Patient's blood pressure has been 101/70 when having symptoms i.e. dizziness.  RN Note:  Denies other symptoms. Wants to know what MD wants to do and if should decrease doses of Amlodipine and Labetolol again.  Symptoms  Reason For Call & Symptoms: Has had difficulty with blood pressure dropping 2 hours after taking blood pressure medicine in morning. Has been happening since began taking medicines and MD decreased both medicines. Decreased dose is not allowing her to get up for 2 hours in morning and has symptoms throughout day as well.  Reviewed Health History In EMR: Yes  Reviewed Medications In EMR: Yes  Reviewed Allergies In EMR: Yes  Reviewed Surgeries / Procedures: Yes  Date of Onset of Symptoms: 05/04/2012 OB / GYN:  LMP: Unknown  Guideline(s) Used:  High Blood Pressure  Disposition Per Guideline:   Discuss with PCP and Callback by Nurse Today  Reason For Disposition Reached:   Taking BP medications and feels is having side effects (e.g., impotence, cough, dizziness)  Advice Given:  N/A  Patient Will Follow Care Advice:  YES

## 2012-06-04 LAB — TYPE AND SCREEN: Antibody Screen: NEGATIVE

## 2012-06-04 NOTE — Telephone Encounter (Signed)
Pt notified to stop the amlodipine and verbalized understanding (removed from med list). Pt said she has been dealing with Fatigue, tremors (shakes)  especially in the moring,  And now these issues with her BP and dizziness, pt was trying to go back to work Monday but with these symptoms do you feel that she should wait another week and if so does she need to f/u with you next week, please advise

## 2012-06-04 NOTE — Telephone Encounter (Signed)
Pt notified of Dr. Tower's recommendations and verbalized understanding  

## 2012-06-04 NOTE — Telephone Encounter (Signed)
Let's see how she does over the weekend off the amlodipine and then she can decide if she is up to work on Monday- if not , we will make her appt to be seen

## 2012-06-04 NOTE — Progress Notes (Signed)
Hematology and Oncology Follow Up Visit  [name] [id number] November 30, 1965 47 y.o. 06/04/2012 10:49 AM   Principle Diagnosis: Encounter Diagnoses  Name Primary?  . Systemic lupus erythematosus Yes  . Chronic anticoagulation   . GLOMERULONEPHRITIS   . ICH, presumed hypertensive      Interim History:   Short interval followup visit for this pleasant but complicated 47 year old woman with underlying lupus I recently saw in consultation on April 8. Please see that note for full details. She has been on chronic anticoagulation since 2010 when she sustained an unprovoked pulmonary embolus. She was found to be a homozygote for the factor V Leiden gene mutation. She had a remote history of a right lower extremity DVT 20 years ago 4 weeks postpartum after her first pregnancy.  She was admitted to [facility] on 04/21/2012 with a hypertensive crisis and found to have a small intraventricular hemorrhage. INR 4.4. Coumadin was reversed. When otherwise stable she was sent home on aspirin 81 mg daily. She had a transient fall in her platelet count to 96,000 but fully recovered.  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 Estimated GFR 25 mL per minute with serum creatinine 2.3.  I felt that she was at high risk for recurrent thrombotic events given her lupus and the genetic risk factor of the factor V Leiden gene mutation. I recommended that she go back on full dose Coumadin anticoagulation with close monitoring. An alternative anticoagulant would be a recently approved apixiban which is only 25% cleared and the kidney but given our limited experience with this drug I felt more comfortable putting her back on the Coumadin.  I suggested to her primary care physician that we make arrangements to get her a home pro time monitor since there have been some issues with patient compliance.  She tells me today that she just went back on the Coumadin yesterday.  Review of  systems: She's had some mild intermittent headaches but nothing persistent or progressive. She had a followup CT brain on April 4 which did not show any evidence for recurrent bleeding.    Medications: reviewed  Allergies:  Allergies  Allergen Reactions  . Amoxicillin     REACTION: Pt. reports allergy, reaction not known  . Atorvastatin     REACTION: Reaction not known  . Moxifloxacin     REACTION: hallucinations     Physical Exam: Blood pressure 116/74, pulse 80, temperature 98.1 F (36.7 C), temperature source Oral, resp. rate 18, height 5\' 5"  (1.651 m), weight 259 lb 6.4 oz (117.663 kg). Wt Readings from Last 3 Encounters:  06/03/12 259 lb 6.4 oz (117.663 kg)  05/11/12 264 lb 12.8 oz (120.112 kg)  05/04/12 270 lb 8 oz (122.698 kg)     General appearance: Pleasant overweight Caucasian woman HENNT: Pharynx no erythema or exudate Lymph nodes: No adenopathy Breasts: Lungs: Clear to auscultation resonant to percussion Heart: Regular rhythm no murmur Abdomen: Soft, nontender, no mass, no organomegaly Extremities: No edema, no calf tenderness Musculoskeletal: GU: Vascular: No cyanosis Neurologic: Mental status intact, PERRLA, optic disc sharp and vessels normal, no hemorrhage or exudate, motor strength 5 over 5, reflexes 1+ symmetric Skin: No rash or ecchymosis  Review of systems: She has had some mild intermittent headaches since her hospitalization but nothing persistent or severe and nothing that has required more than Tylenol for relief. She did have a followup  Lab Results: Lab Results  Component Value Date   WBC 4.0 06/03/2012  HGB 8.8* 06/03/2012   HCT 28.0* 06/03/2012   MCV 87.2 06/03/2012   PLT 111* 06/03/2012     Chemistry      Component Value Date/Time   NA 144 05/31/2012 0852   K 3.6 05/31/2012 0852   CL 109 05/31/2012 0852   CO2 28 05/31/2012 0852   BUN 13 05/31/2012 0852   CREATININE 1.30* 05/31/2012 0852   CREATININE 1.97* 04/25/2012 0800      Component  Value Date/Time   CALCIUM 8.1* 05/31/2012 0852   ALKPHOS 106 05/31/2012 0852   AST 47* 05/31/2012 0852   ALT 52* 05/31/2012 0852   BILITOT 0.4 05/31/2012 0852       Radiological Studies: Ct Head Wo Contrast  05/05/2012  *RADIOLOGY REPORT*  Clinical Data: Recent intracranial hemorrhage.  Recurrent nausea and headache.  CT HEAD WITHOUT CONTRAST  Technique:  Contiguous axial images were obtained from the base of the skull through the vertex without contrast.  Comparison: 04/27/2012 and multiple previous  Findings: There is no evidence of recurrent intracranial hemorrhage.  The brain does not show atrophy.  Chronic small vessel ischemic changes are again seen within the thalami, radiating white matter tracts and pons.  Chronic calcification of the pineal gland is unchanged.  Atrophy of the medial thalamus on the left has developed with previous hemorrhage is located.  No hydrocephalus. No extra-axial collection.  No evidence of mass lesion.  Calvarium is unremarkable.  Sinuses, middle ears and mastoids are clear.  IMPRESSION: No evidence of recurrent hemorrhage.  Atrophy in the medial left thalamus at this site of the previous hemorrhage.  Chronic small vessel changes.  No hydrocephalus.   Original Report Authenticated By: Paulina Fusi, M.D.     Impression: #1. Multifactorial coagulopathy related to lupus(but negative lupus-type anticoagulant), and homozygosity for the factor V Leiden gene mutation with prior history of DVT and pulmonary emboli. Plan: Resume full dose Coumadin anticoagulation. Arrange home Coumadin monitoring as a adjunct to office monitoring her primary care physician's office. I will continue to follow her periodically with her other physicians given the complex and multifactorial nature of her medical problems and need for chronic anticoagulation.  #2. Recent limited intraventricular hemorrhage presumed related to hypertensive crisis and supratherapeutic level of warfarin. No neurologic  sequela.  CC:. Dr. Vinnie Langton; Dr. Shan Levans; Dr. Stacey Drain   Levert Feinstein, MD 5/2/201410:49 AM

## 2012-06-07 ENCOUNTER — Ambulatory Visit (INDEPENDENT_AMBULATORY_CARE_PROVIDER_SITE_OTHER): Payer: Managed Care, Other (non HMO) | Admitting: Critical Care Medicine

## 2012-06-07 ENCOUNTER — Telehealth: Payer: Self-pay | Admitting: Family Medicine

## 2012-06-07 ENCOUNTER — Encounter: Payer: Self-pay | Admitting: Critical Care Medicine

## 2012-06-07 VITALS — BP 142/80 | HR 74 | Temp 98.4°F | Ht 65.0 in | Wt 258.0 lb

## 2012-06-07 DIAGNOSIS — I619 Nontraumatic intracerebral hemorrhage, unspecified: Secondary | ICD-10-CM

## 2012-06-07 DIAGNOSIS — Z9884 Bariatric surgery status: Secondary | ICD-10-CM

## 2012-06-07 DIAGNOSIS — Z9989 Dependence on other enabling machines and devices: Secondary | ICD-10-CM

## 2012-06-07 DIAGNOSIS — I2699 Other pulmonary embolism without acute cor pulmonale: Secondary | ICD-10-CM

## 2012-06-07 DIAGNOSIS — N179 Acute kidney failure, unspecified: Secondary | ICD-10-CM

## 2012-06-07 DIAGNOSIS — G4733 Obstructive sleep apnea (adult) (pediatric): Secondary | ICD-10-CM

## 2012-06-07 DIAGNOSIS — J90 Pleural effusion, not elsewhere classified: Secondary | ICD-10-CM

## 2012-06-07 DIAGNOSIS — I10 Essential (primary) hypertension: Secondary | ICD-10-CM

## 2012-06-07 NOTE — Assessment & Plan Note (Signed)
Hypertension had been poorly controlled but now is much improved on current program per primary care Plan No additional recommendations relative to the patient's blood pressure control

## 2012-06-07 NOTE — Assessment & Plan Note (Signed)
History of a small intracranial hemorrhage that was related to hypertension out of control and elevated INR do to Coumadin use Note most recent CT scan of the head early April 2014 showed resolution of intracranial hemorrhage Plan From a pulmonary perspective I do agree with hematology that anticoagulation should be continued as the patient is at high-risk for unprovoked venous thromboembolism recurrence Coumadin appears to be the best choice for this patient and I agree with hematology in this regard. A home INR monitoring system would be useful and the patient apparently is going to obtain one

## 2012-06-07 NOTE — Assessment & Plan Note (Signed)
Status post bariatric surgery with significant weight loss which as helped with the patient's other multiple medical problems

## 2012-06-07 NOTE — Assessment & Plan Note (Signed)
History of acute kidney injury with low complement levels route during recent hospitalization Note renal function now improving Note patient now being placed on CellCept per renal therefore dosage of prednisone might be reduced in this patient I have asked the patient to confer with her rheumatologist regarding prednisone dosing in the setting of CellCept now for renal dysfunction likely due to lupus related renal injury

## 2012-06-07 NOTE — Progress Notes (Signed)
Subjective:    Patient ID: [name], female    DOB: [birth year], 47 y.o.   MRN: [id number]  HPI  47 y.o. wf last smoked around 06/2009 with chronic L empyema s/p VATS 2010.  History of lupus which was the cause of the pleural effusion is that became secondarily infected. History of venous thromboembolic disease with hypercoagulable state. History of intracranial hemorrhage on elevated INR.   06/07/2012 Since last OV pt suffered ICH.  INR 4.4 on coumadin At last Heme visit with dr Cyndie Chime: Impression:  #1. Multifactorial coagulopathy related to lupus(but negative lupus-type anticoagulant), and homozygosity for the factor V Leiden gene mutation with prior history of DVT and pulmonary emboli.  Plan: Resume full dose Coumadin anticoagulation.  Arrange home Coumadin monitoring as a adjunct to office monitoring her primary care physician's office.  I will continue to follow her periodically with her other physicians given the complex and multifactorial nature of her medical problems and need for chronic anticoagulation.  #2. Recent limited intraventricular hemorrhage presumed related to hypertensive crisis and supratherapeutic level of warfarin. No neurologic sequela.   Note back on coumadin INR 1.5- 1.6   Last several checks Now on normodyne for bp. Had adm with HTNive crisis and ICH and now on Beta blockade for BP. Also on cellcept per renal for lupus AKI low complement.  Renal bx was cancelled recently  Pt notes some sl headaches.  Now on prednisone and h/a with high dose prednisone.  Pt notes irritation in the throat. No real wheeze.  No change is dyspnea  No leg pain.    On an unrelated note the patient has sleep disorder with sleep apnea and is not wearing CPAP because a CPAP mask is not fitting properly  Past Medical History  Diagnosis Date  . Nonspecific abnormal results of liver function study   . Personal history of venous thrombosis and embolism 2002    during pregnancy;  ?factor 5 leiden (sees heme)  . Edema   . 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 Nephritis and nephropathy, not specified as acute or chronic, with unspecified pathological lesion in kidney   . Pure hypercholesterolemia   . Unspecified essential hypertension   . Other specified acquired hypothyroidism   . Obesity, unspecified   . Panic disorder without agoraphobia   . Unspecified pleural effusion   . Other pulmonary embolism and infarction   . 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 Pleural effusion 2005    c/w lupus initial w/u; recurrent Right as pred tapered off July 2011  . HLD (hyperlipidemia)   . Iron deficiency anemia     IV dextran [place]  . 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 GERD (gastroesophageal reflux disease)     on prilosec r/t gastric sleeve surgery  . Arthritis     Lupus - hands/knees  . Anxiety state, unspecified     panic attacks  . Sleep apnea      Family History  Problem Relation Age of Onset  . Lupus Sister   . Diabetes      GM     History   Social History  . Marital Status: Divorced    Spouse Name: N/A    Number of Children: 3  . Years of Education: N/A   Occupational History  . Trainer at [employer]  Social History Main Topics  . Smoking status: Former Smoker -- 0.50 packs/day for 10 years    Types: Cigarettes    Quit date: 05/04/2009  . Smokeless tobacco: Never Used     Comment: Socially x10 years (1 pack/month)  . Alcohol Use: Yes     Comment: socially  . Drug Use: No  . Sexually Active: Yes    Birth Control/ Protection: Condom   Other Topics Concern  . Not on file   Social History Narrative  . No narrative on file     Allergies  Allergen Reactions  . Amoxicillin     REACTION: Pt. reports allergy, reaction not known  . Atorvastatin     REACTION: Reaction not known  . Moxifloxacin     REACTION:  hallucinations     Outpatient Prescriptions Prior to Visit  Medication Sig Dispense Refill  . labetalol (NORMODYNE) 200 MG tablet Take 100 mg by mouth 2 (two) times daily.      Marland Kitchen levothyroxine (SYNTHROID, LEVOTHROID) 175 MCG tablet Take 1 tablet (175 mcg total) by mouth daily.  90 tablet  3  . mycophenolate (CELLCEPT) 500 MG tablet 500 mg 2 (two) times daily. Bid x 2 weeks then 1000 mg bid.      Marland Kitchen omeprazole (PRILOSEC) 20 MG capsule Take 20 mg by mouth daily.      Marland Kitchen PARoxetine (PAXIL) 40 MG tablet Take 40 mg by mouth every morning.      . predniSONE (DELTASONE) 10 MG tablet Take 1 tablet (10 mg total) by mouth daily. Taper as directed.  60 tablet  0  . warfarin (COUMADIN) 1 MG tablet Take 3 mg by mouth See admin instructions. Three 1mg  tablets with one 10mg  tablet on Tuesdays and Fridays.      Marland Kitchen warfarin (COUMADIN) 10 MG tablet Take 10 mg by mouth See admin instructions. 11.25 mg x 3 days then 7.5 mg daily x 11.25 mg on M W F.      . warfarin (COUMADIN) 7.5 MG tablet Take 11.25 mg by mouth See admin instructions. 11.25 daily x 3 day, then 7.5 mg daily x 11.25 mg M W F.       No facility-administered medications prior to visit.     Review of Systems  Constitutional:   No  weight loss, night sweats,  Fevers, chills, fatigue, lassitude. HEENT:   No headaches,  Difficulty swallowing,  Tooth/dental problems,  Sore throat,                No sneezing, itching, ear ache, nasal congestion, post nasal drip,   CV:  No chest pain,  Orthopnea, PND, swelling in lower extremities, anasarca, dizziness, palpitations  GI  No heartburn, indigestion, abdominal pain, nausea, vomiting, diarrhea, change in bowel habits, loss of appetite  Resp: +++ shortness of breath with exertion Not at rest.  No excess mucus, no productive cough,  No non-productive cough,  No coughing up of blood.  No change in color of mucus.  No wheezing.  No chest wall deformity  Skin: no rash or lesions.  GU: no dysuria, change in  color of urine, no urgency or frequency.  No flank pain.  MS:  No joint pain or swelling.  No decreased range of motion.  No back pain.  Psych:  No change in mood or affect. No depression or anxiety.  No memory loss.     Objective:   Physical Exam   Filed Vitals:   06/07/12 1103  BP: 142/80  Pulse: 74  Temp: 98.4 F (36.9 C)  TempSrc: Oral  Height: 5\' 5"  (1.651 m)  Weight: 258 lb (117.028 kg)  SpO2: 97%    Gen: Pleasant, well-nourished, in no distress,  normal affect  ENT: No lesions,  mouth clear,  oropharynx clear, no postnasal drip  Neck: No JVD, no TMG, no carotid bruits  Lungs: No use of accessory muscles, no dullness to percussion, decreased BS 1/4 way up on L  Cardiovascular: RRR, heart sounds normal, no murmur or gallops, no peripheral edema  Abdomen: soft and NT, no HSM,  BS normal  Musculoskeletal: No deformities, no cyanosis or clubbing  Neuro: alert, non focal  Skin: Warm, no lesions or rashes  All laboratory data and imaging studies from recent hospitalization have been reviewed on 06/07/2012     Assessment & Plan:   ICH, presumed hypertensive History of a small intracranial hemorrhage that was related to hypertension out of control and elevated INR do to Coumadin use Note most recent CT scan of the head early April 2014 showed resolution of intracranial hemorrhage Plan From a pulmonary perspective I do agree with hematology that anticoagulation should be continued as the patient is at high-risk for unprovoked venous thromboembolism recurrence Coumadin appears to be the best choice for this patient and I agree with hematology in this regard. 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 stable at this time [facility] plan continued observation  OSA on CPAP Poor compliance with CPAP mask at this time due to poorly fitting nasal mask Note optimal pressure is at 14 cm water Plan Obtain best fit CPAP mask via home DME company  AKI (acute kidney injury) History of acute kidney injury with low complement levels route during recent hospitalization Note renal function now improving Note patient now being placed on CellCept per renal therefore dosage of prednisone might be reduced in this patient I have asked the patient to confer with her rheumatologist regarding prednisone dosing in the setting of CellCept now for renal dysfunction likely due to lupus related renal injury  S/P bariatric surgery Status post bariatric surgery with significant weight loss which as helped with the patient's other multiple medical problems    Updated Medication List Outpatient Encounter Prescriptions as of 06/07/2012  Medication Sig Dispense Refill  . labetalol (NORMODYNE) 200 MG tablet Take 100 mg by mouth 2 (two) times daily.      Marland Kitchen levothyroxine (SYNTHROID, LEVOTHROID) 175 MCG tablet Take 1 tablet (175 mcg total) by mouth daily.  90 tablet  3  . mycophenolate (CELLCEPT) 500 MG tablet 500 mg 2 (two) times daily. Bid x 2 weeks then 1000 mg bid.      Marland Kitchen omeprazole (PRILOSEC) 20 MG capsule Take 20 mg by mouth daily.      Marland Kitchen PARoxetine (PAXIL) 40 MG tablet Take 40 mg by mouth every morning.      . predniSONE (DELTASONE) 10 MG tablet Take 1 tablet (10 mg total) by mouth daily. Taper as directed.  60 tablet  0  . warfarin (COUMADIN) 1 MG tablet Take 3 mg by mouth See admin instructions. Three 1mg  tablets with one 10mg  tablet on Tuesdays  and Fridays.      Marland Kitchen warfarin (COUMADIN) 10 MG tablet Take 10 mg by mouth See admin instructions. 11.25 mg x 3 days then 7.5 mg daily x 11.25 mg on M W F.      . warfarin (COUMADIN) 7.5 MG tablet Take 11.25 mg by mouth See admin instructions. 11.25 daily x 3 day, then 7.5 mg daily x 11.25 mg M W F.       No facility-administered encounter medications on file as of 06/07/2012.

## 2012-06-07 NOTE — Assessment & Plan Note (Signed)
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

## 2012-06-07 NOTE — Telephone Encounter (Signed)
Pt has been out of work under [employer] and is returning today.  She needs a note signed by you to be released back to work. She says once you complete it, you can fax it to [phone] to her attention.  Can you accommodate this? Thank you.

## 2012-06-07 NOTE — Assessment & Plan Note (Signed)
History of factor V Leiden deficiency and hypercoagulable state also aggravated by lupus Plan The patient would need Coumadin for life as she is a very high risk for recurrent VTE

## 2012-06-07 NOTE — Assessment & Plan Note (Signed)
Poor compliance with CPAP mask at this time due to poorly fitting nasal mask Note optimal pressure is at 14 cm water Plan Obtain best fit CPAP mask via home DME company

## 2012-06-07 NOTE — Patient Instructions (Addendum)
Contact Dr Kellie Simmering regarding further changes to prednisone while on cellcept No medication changes We will get [facility] to offer mask choices for cpap Return 6 months

## 2012-06-07 NOTE — Telephone Encounter (Signed)
Note is in IN box to fax

## 2012-06-07 NOTE — Telephone Encounter (Signed)
Note faxed and pt notified

## 2012-06-14 ENCOUNTER — Other Ambulatory Visit: Payer: Self-pay

## 2012-06-14 ENCOUNTER — Ambulatory Visit (INDEPENDENT_AMBULATORY_CARE_PROVIDER_SITE_OTHER): Payer: Managed Care, Other (non HMO) | Admitting: General Practice

## 2012-06-14 DIAGNOSIS — Z86718 Personal history of other venous thrombosis and embolism: Secondary | ICD-10-CM

## 2012-06-14 DIAGNOSIS — Z5181 Encounter for therapeutic drug level monitoring: Secondary | ICD-10-CM

## 2012-06-14 DIAGNOSIS — Z7902 Long term (current) use of antithrombotics/antiplatelets: Secondary | ICD-10-CM

## 2012-06-14 DIAGNOSIS — I2699 Other pulmonary embolism without acute cor pulmonale: Secondary | ICD-10-CM

## 2012-06-14 MED ORDER — LABETALOL HCL 200 MG PO TABS: 100.0000 mg | ORAL_TABLET | Freq: Two times a day (BID) | ORAL | Status: DC

## 2012-06-14 NOTE — Telephone Encounter (Signed)
She can have 6 mo of refils  We have changed her dose due to hypotension several times - I think it is correct in the chart now but if any problems please let me know

## 2012-06-14 NOTE — Telephone Encounter (Signed)
Patient notified by telephone to call if any problems.

## 2012-06-14 NOTE — Telephone Encounter (Signed)
Pt left note requesting refill labetalol to [facility] [place];Labetalol was prescribed by ER physician.Please advise.

## 2012-06-18 ENCOUNTER — Ambulatory Visit (INDEPENDENT_AMBULATORY_CARE_PROVIDER_SITE_OTHER): Payer: Managed Care, Other (non HMO) | Admitting: General Practice

## 2012-06-18 DIAGNOSIS — Z7901 Long term (current) use of anticoagulants: Secondary | ICD-10-CM

## 2012-06-18 DIAGNOSIS — Z7902 Long term (current) use of antithrombotics/antiplatelets: Secondary | ICD-10-CM

## 2012-06-18 DIAGNOSIS — I2699 Other pulmonary embolism without acute cor pulmonale: Secondary | ICD-10-CM

## 2012-06-18 DIAGNOSIS — Z86718 Personal history of other venous thrombosis and embolism: Secondary | ICD-10-CM

## 2012-06-18 DIAGNOSIS — Z5181 Encounter for therapeutic drug level monitoring: Secondary | ICD-10-CM

## 2012-06-24 ENCOUNTER — Telehealth: Payer: Self-pay

## 2012-06-24 NOTE — Telephone Encounter (Signed)
Revonda Standard with [employer] left v/m pt # [id number] asking about pt return date to work, when pt to be rehospitalized for biopsy, treatment plan and multiple other issues. Spoke with Misty Stanley at [employer] and requested all information needed faxed to office. Misty Stanley voiced understanding.

## 2012-06-28 ENCOUNTER — Other Ambulatory Visit: Payer: Self-pay | Admitting: Critical Care Medicine

## 2012-06-28 ENCOUNTER — Other Ambulatory Visit: Payer: Self-pay | Admitting: Family Medicine

## 2012-06-29 ENCOUNTER — Other Ambulatory Visit: Payer: Self-pay | Admitting: General Practice

## 2012-06-29 ENCOUNTER — Ambulatory Visit (INDEPENDENT_AMBULATORY_CARE_PROVIDER_SITE_OTHER): Payer: Managed Care, Other (non HMO) | Admitting: General Practice

## 2012-06-29 DIAGNOSIS — Z5181 Encounter for therapeutic drug level monitoring: Secondary | ICD-10-CM

## 2012-06-29 DIAGNOSIS — Z7902 Long term (current) use of antithrombotics/antiplatelets: Secondary | ICD-10-CM

## 2012-06-29 DIAGNOSIS — I2699 Other pulmonary embolism without acute cor pulmonale: Secondary | ICD-10-CM

## 2012-06-29 DIAGNOSIS — Z86718 Personal history of other venous thrombosis and embolism: Secondary | ICD-10-CM

## 2012-06-29 MED ORDER — WARFARIN SODIUM 2.5 MG PO TABS: ORAL_TABLET | ORAL | Status: DC

## 2012-07-01 NOTE — Telephone Encounter (Signed)
Per last OV note with PW on 06/07/12:  Patient Instructions    Contact Dr Kellie Simmering regarding further changes to prednisone while on cellcept  No medication changes  We will get [facility] to offer mask choices for cpap  Return 6 months   ------  Spoke with PW.  He would like Dr. Kellie Simmering to rx this for now while pt is on cellcept.  Will defer rx to Dr. Kellie Simmering.

## 2012-07-02 ENCOUNTER — Other Ambulatory Visit: Payer: Self-pay

## 2012-07-02 MED ORDER — PREDNISONE 10 MG PO TABS: 10.0000 mg | ORAL_TABLET | Freq: Every day | ORAL | Status: DC

## 2012-07-02 NOTE — Telephone Encounter (Signed)
Okay to refill #30 x 0 but I am not totally sure if Dr Milinda Antis treats her lupus or whether she still wanted her on this. Please send her the note to review after the refill

## 2012-07-02 NOTE — Telephone Encounter (Signed)
Pt request Dr Milinda Antis to refill prednisone 10 mg; pt has been out of prednisone x 2 days. Pt said Dr Delford Field did not refill prednisone and Dr Kellie Simmering is out of office.  Pt also request refill on warfarin; advised pt to call Bailey Mech at [phone]. Pt voiced understanding and request call back re; prednisone.Please advise.

## 2012-07-02 NOTE — Telephone Encounter (Signed)
Rx filled, Dr. Milinda Antis please see prev. Note from Dr. Alphonsus Sias

## 2012-07-04 NOTE — Telephone Encounter (Signed)
Sounds like this should have come from Dr Kellie Simmering - but he was out of the office - so next time we will tell her to get that from him

## 2012-07-05 ENCOUNTER — Other Ambulatory Visit: Payer: Self-pay | Admitting: General Practice

## 2012-07-05 MED ORDER — WARFARIN SODIUM 5 MG PO TABS: 5.0000 mg | ORAL_TABLET | Freq: Every day | ORAL | Status: DC

## 2012-07-13 ENCOUNTER — Ambulatory Visit (INDEPENDENT_AMBULATORY_CARE_PROVIDER_SITE_OTHER): Payer: Managed Care, Other (non HMO) | Admitting: Family Medicine

## 2012-07-13 DIAGNOSIS — Z86718 Personal history of other venous thrombosis and embolism: Secondary | ICD-10-CM

## 2012-07-13 DIAGNOSIS — I2699 Other pulmonary embolism without acute cor pulmonale: Secondary | ICD-10-CM

## 2012-07-13 DIAGNOSIS — Z5181 Encounter for therapeutic drug level monitoring: Secondary | ICD-10-CM

## 2012-07-13 DIAGNOSIS — Z7902 Long term (current) use of antithrombotics/antiplatelets: Secondary | ICD-10-CM

## 2012-08-17 ENCOUNTER — Other Ambulatory Visit: Payer: Self-pay | Admitting: *Deleted

## 2012-08-17 MED ORDER — PAROXETINE HCL 40 MG PO TABS: 40.0000 mg | ORAL_TABLET | Freq: Every morning | ORAL | Status: DC

## 2012-08-25 ENCOUNTER — Encounter: Payer: Self-pay | Admitting: Family Medicine

## 2012-09-06 ENCOUNTER — Ambulatory Visit (INDEPENDENT_AMBULATORY_CARE_PROVIDER_SITE_OTHER): Payer: Managed Care, Other (non HMO) | Admitting: General Practice

## 2012-09-06 DIAGNOSIS — Z5181 Encounter for therapeutic drug level monitoring: Secondary | ICD-10-CM

## 2012-09-06 DIAGNOSIS — Z86718 Personal history of other venous thrombosis and embolism: Secondary | ICD-10-CM

## 2012-09-06 DIAGNOSIS — I2699 Other pulmonary embolism without acute cor pulmonale: Secondary | ICD-10-CM

## 2012-09-06 DIAGNOSIS — Z7902 Long term (current) use of antithrombotics/antiplatelets: Secondary | ICD-10-CM

## 2012-09-07 ENCOUNTER — Telehealth: Payer: Self-pay | Admitting: General Practice

## 2012-09-07 ENCOUNTER — Other Ambulatory Visit: Payer: Self-pay | Admitting: General Practice

## 2012-09-07 MED ORDER — ENOXAPARIN SODIUM 120 MG/0.8ML ~~LOC~~ SOLN: 1.0000 mg/kg | Freq: Two times a day (BID) | SUBCUTANEOUS | Status: DC

## 2012-09-07 NOTE — Telephone Encounter (Signed)
Message copied by Garrison Columbus on Tue Sep 07, 2012  9:30 AM ------      Message from: Roxy Manns A      Created: Tue Sep 07, 2012  8:08 AM      Regarding: RE: Lovenox bridge       That sounds fine to me- do you need me to send in the px ?      ----- Message -----         From: Garrison Columbus, RN         Sent: 09/06/2012   2:03 PM           To: Judy Pimple, MD      Subject: FW: Lovenox bridge                                       Hi Dr. Milinda Antis,  I just wanted to run this by you.  What do you think?      ----- Message -----         From: Levert Feinstein, MD         Sent: 09/06/2012   1:28 PM           To: Garrison Columbus, RN, Judy Pimple, MD      Subject: RE: Lovenox bridge                                       Her primary care is managing her coumadin but I do think it is a good idea to bridge her. Stop coumadin for 3 days before procedure.  Resume coumadin night of procedure. Give her a 1.5 mg/kg dose of lovenox starting 24 hours post procedure and continue daily until coumadin again therapeutic.      DrG      ----- Message -----         From: Garrison Columbus, RN         Sent: 09/06/2012  10:00 AM           To: Levert Feinstein, MD      Subject: Lovenox bridge                                           Patient will be having a kidney biopsy on 8/19.  Do you want her to have Lovenox bridge?  Thanks!                               ------

## 2012-09-08 ENCOUNTER — Other Ambulatory Visit: Payer: Self-pay

## 2012-09-10 ENCOUNTER — Other Ambulatory Visit (HOSPITAL_COMMUNITY): Payer: Self-pay | Admitting: Nephrology

## 2012-09-10 DIAGNOSIS — R809 Proteinuria, unspecified: Secondary | ICD-10-CM

## 2012-09-13 ENCOUNTER — Telehealth: Payer: Self-pay | Admitting: General Practice

## 2012-09-13 NOTE — Telephone Encounter (Signed)
Instructions for coumadin and Lovenox pre and post procedure on 8/19. 8/14 - Last dose of coumadin 8/15 - Nothing 8/16 - Lovenox in AM and PM 8/17 - Lovenox in AM and PM 8/18 - Lovenox in AM only 8/19- Day of procedure (take nothing) 8/20 - Lovenox in AM and PM and take 1 1/2 tablets of coumadin 8/21 - Lovenox in AM and PM and take 1 1/2 tablets of coumadin 8/22 - Lovenox in AM and PM and take 1 tablet of coumadin 8/23 - Lovenox in AM and PM and take 1 tablet of coumadin 8/24 - Lovenox in AM and PM and take 1 tablet on coumadin 8/25 - Re-check in clinic.  Patient verbalized understanding.

## 2012-09-13 NOTE — Telephone Encounter (Signed)
In regards to instructions below, procedure date has been changed to 8/21 so please bump instructions up 2 days and last dose of coumadin will be on 8/16.  Spoke to patient and pt verbalized understanding.

## 2012-09-22 ENCOUNTER — Encounter (HOSPITAL_COMMUNITY)
Admission: RE | Admit: 2012-09-22 | Discharge: 2012-09-22 | Disposition: A | Discharge status: Home / Self Care | Payer: Managed Care, Other (non HMO) | Source: Ambulatory Visit | Attending: Nephrology | Admitting: Nephrology

## 2012-09-22 LAB — PLATELET FUNCTION ASSAY: Collagen / Epinephrine: 146 seconds (ref 0–184)

## 2012-09-22 LAB — COMPREHENSIVE METABOLIC PANEL
ALT: 15 U/L (ref 0–35)
CO2: 26 mEq/L (ref 19–32)
Calcium: 8.8 mg/dL (ref 8.4–10.5)
Chloride: 108 mEq/L (ref 96–112)
Creatinine, Ser: 1.41 mg/dL — ABNORMAL HIGH (ref 0.50–1.10)
GFR calc Af Amer: 51 mL/min — ABNORMAL LOW (ref >90)
GFR calc non Af Amer: 44 mL/min — ABNORMAL LOW (ref >90)
Glucose, Bld: 74 mg/dL (ref 70–99)
Sodium: 140 mEq/L (ref 135–145)
Total Bilirubin: 0.2 mg/dL — ABNORMAL LOW (ref 0.3–1.2)

## 2012-09-22 LAB — CBC WITH DIFFERENTIAL/PLATELET
Eosinophils Relative: 2 % (ref 0–5)
HCT: 33 % — ABNORMAL LOW (ref 36.0–46.0)
Lymphocytes Relative: 18 % (ref 12–46)
Lymphs Abs: 0.7 10*3/uL (ref 0.7–4.0)
MCV: 88.9 fL (ref 78.0–100.0)
Monocytes Absolute: 0.4 10*3/uL (ref 0.1–1.0)
Neutro Abs: 2.6 10*3/uL (ref 1.7–7.7)
RBC: 3.71 MIL/uL — ABNORMAL LOW (ref 3.87–5.11)
WBC: 3.7 10*3/uL — ABNORMAL LOW (ref 4.0–10.5)

## 2012-09-22 LAB — TYPE AND SCREEN

## 2012-09-22 LAB — APTT: aPTT: 30 seconds (ref 24–37)

## 2012-09-23 ENCOUNTER — Other Ambulatory Visit (HOSPITAL_COMMUNITY): Payer: Managed Care, Other (non HMO)

## 2012-09-24 ENCOUNTER — Observation Stay (HOSPITAL_COMMUNITY)
Admission: RE | Admit: 2012-09-24 | Discharge: 2012-09-25 | Disposition: A | Discharge status: Home / Self Care | Payer: Managed Care, Other (non HMO) | Source: Ambulatory Visit | Attending: Nephrology | Admitting: Nephrology

## 2012-09-24 ENCOUNTER — Encounter (HOSPITAL_COMMUNITY): Payer: Self-pay

## 2012-09-24 VITALS — BP 143/87 | HR 84 | Temp 97.5°F | Resp 18 | Ht 65.0 in | Wt 258.0 lb

## 2012-09-24 DIAGNOSIS — D6859 Other primary thrombophilia: Secondary | ICD-10-CM | POA: Insufficient documentation

## 2012-09-24 DIAGNOSIS — Z7901 Long term (current) use of anticoagulants: Secondary | ICD-10-CM | POA: Insufficient documentation

## 2012-09-24 DIAGNOSIS — M329 Systemic lupus erythematosus, unspecified: Principal | ICD-10-CM | POA: Insufficient documentation

## 2012-09-24 DIAGNOSIS — N059 Unspecified nephritic syndrome with unspecified morphologic changes: Secondary | ICD-10-CM | POA: Insufficient documentation

## 2012-09-24 DIAGNOSIS — I10 Essential (primary) hypertension: Secondary | ICD-10-CM | POA: Insufficient documentation

## 2012-09-24 DIAGNOSIS — Z79899 Other long term (current) drug therapy: Secondary | ICD-10-CM | POA: Insufficient documentation

## 2012-09-24 DIAGNOSIS — E039 Hypothyroidism, unspecified: Secondary | ICD-10-CM | POA: Insufficient documentation

## 2012-09-24 DIAGNOSIS — R809 Proteinuria, unspecified: Secondary | ICD-10-CM

## 2012-09-24 HISTORY — PX: RENAL BIOPSY: SHX156

## 2012-09-24 LAB — CBC
HCT: 33.6 % — ABNORMAL LOW (ref 36.0–46.0)
Hemoglobin: 10.6 g/dL — ABNORMAL LOW (ref 12.0–15.0)
MCH: 28 pg (ref 26.0–34.0)
MCHC: 31.5 g/dL (ref 30.0–36.0)
RBC: 3.79 MIL/uL — ABNORMAL LOW (ref 3.87–5.11)

## 2012-09-24 MED ORDER — ACETAMINOPHEN 325 MG PO TABS: 650.0000 mg | ORAL_TABLET | Freq: Four times a day (QID) | ORAL | Status: DC | PRN

## 2012-09-24 MED ORDER — CALCIUM CARBONATE 1250 MG/5ML PO SUSP: 500.0000 mg | Freq: Four times a day (QID) | ORAL | Status: DC | PRN

## 2012-09-24 MED ORDER — LABETALOL HCL 100 MG PO TABS
100.0000 mg | ORAL_TABLET | Freq: Two times a day (BID) | ORAL | Status: DC
  Administered 2012-09-24 – 2012-09-25 (×2): 100 mg via ORAL
  Filled 2012-09-24 (×3): qty 1

## 2012-09-24 MED ORDER — ONDANSETRON HCL 4 MG PO TABS: 4.0000 mg | ORAL_TABLET | Freq: Four times a day (QID) | ORAL | Status: DC | PRN

## 2012-09-24 MED ORDER — ZOLPIDEM TARTRATE 5 MG PO TABS: 5.0000 mg | ORAL_TABLET | Freq: Every evening | ORAL | Status: DC | PRN

## 2012-09-24 MED ORDER — NEPRO/CARBSTEADY PO LIQD: 237.0000 mL | Freq: Three times a day (TID) | ORAL | Status: DC | PRN

## 2012-09-24 MED ORDER — MYCOPHENOLATE MOFETIL 250 MG PO CAPS
500.0000 mg | ORAL_CAPSULE | Freq: Two times a day (BID) | ORAL | Status: DC
  Administered 2012-09-24 – 2012-09-25 (×2): 500 mg via ORAL
  Filled 2012-09-24 (×3): qty 2

## 2012-09-24 MED ORDER — DOCUSATE SODIUM 283 MG RE ENEM
1.0000 | ENEMA | RECTAL | Status: DC | PRN
  Filled 2012-09-24: qty 1

## 2012-09-24 MED ORDER — PREDNISONE 10 MG PO TABS
10.0000 mg | ORAL_TABLET | Freq: Every day | ORAL | Status: DC
  Administered 2012-09-25: 10 mg via ORAL
  Filled 2012-09-24 (×2): qty 1

## 2012-09-24 MED ORDER — PANTOPRAZOLE SODIUM 40 MG PO TBEC
40.0000 mg | DELAYED_RELEASE_TABLET | Freq: Every day | ORAL | Status: DC
  Administered 2012-09-25: 40 mg via ORAL
  Filled 2012-09-24: qty 1

## 2012-09-24 MED ORDER — ONDANSETRON HCL 4 MG/2ML IJ SOLN: 4.0000 mg | Freq: Four times a day (QID) | INTRAMUSCULAR | Status: DC | PRN

## 2012-09-24 MED ORDER — LORAZEPAM 1 MG PO TABS
ORAL_TABLET | ORAL | Status: AC
  Administered 2012-09-24: 11:00:00
  Filled 2012-09-24: qty 2

## 2012-09-24 MED ORDER — ACETAMINOPHEN 650 MG RE SUPP: 650.0000 mg | Freq: Four times a day (QID) | RECTAL | Status: DC | PRN

## 2012-09-24 MED ORDER — LORAZEPAM 1 MG PO TABS
2.0000 mg | ORAL_TABLET | Freq: Once | ORAL | Status: AC
  Administered 2012-09-24: 2 mg via ORAL

## 2012-09-24 MED ORDER — MYCOPHENOLATE MOFETIL 500 MG PO TABS: 500.0000 mg | ORAL_TABLET | Freq: Two times a day (BID) | ORAL | Status: DC

## 2012-09-24 MED ORDER — HYDROXYZINE HCL 25 MG PO TABS
25.0000 mg | ORAL_TABLET | Freq: Three times a day (TID) | ORAL | Status: DC | PRN
  Administered 2012-09-24 – 2012-09-25 (×2): 25 mg via ORAL
  Filled 2012-09-24 (×2): qty 1

## 2012-09-24 MED ORDER — SORBITOL 70 % SOLN
30.0000 mL | Status: DC | PRN
  Filled 2012-09-24: qty 30

## 2012-09-24 MED ORDER — LEVOTHYROXINE SODIUM 175 MCG PO TABS
175.0000 ug | ORAL_TABLET | Freq: Every day | ORAL | Status: DC
  Administered 2012-09-25: 175 ug via ORAL
  Filled 2012-09-24 (×2): qty 1

## 2012-09-24 MED ORDER — SODIUM CHLORIDE 0.9 % IV SOLN
INTRAVENOUS | Status: DC
  Administered 2012-09-24: 11:00:00 via INTRAVENOUS

## 2012-09-24 MED ORDER — CAMPHOR-MENTHOL 0.5-0.5 % EX LOTN
1.0000 "application " | TOPICAL_LOTION | Freq: Three times a day (TID) | CUTANEOUS | Status: DC | PRN
  Filled 2012-09-24: qty 222

## 2012-09-24 MED ORDER — PAROXETINE HCL 20 MG PO TABS
40.0000 mg | ORAL_TABLET | Freq: Every morning | ORAL | Status: DC
  Administered 2012-09-25: 40 mg via ORAL
  Filled 2012-09-24: qty 2

## 2012-09-24 NOTE — ED Notes (Signed)
Awaiting room assignment.  Good specimens obtained by Dr Deanne Coffer.  No IV sedation given.

## 2012-09-24 NOTE — Procedures (Signed)
Patient in prone position.  Kidneys localized with U/S.  Prep clorohexadine, xylocaine LA.  Using U/S guidance was attempted on the right kidney with 4 passes to obtain 0 cores of tissue.  Unfortunately the biopsy gun was not long enough to get to capsule/cortex and trials retrieved fatty tissue.  Interventional Radiology, Dr. Deanne Coffer was asked to assist and was able to obtain 1 and 1/2 cores on 2 passes. EBL N/A.  Tolerated well.  No acute complications.

## 2012-09-24 NOTE — ED Notes (Signed)
No IV sedation.  Dr Abel Presto doing Bbiopsy

## 2012-09-24 NOTE — ED Notes (Signed)
VSS.  Resting with eyes closed unless disturbed.  No c/o pain.  Awaiting room placement.  Lying on back.  Dr Abel Presto speaking with family and pt.

## 2012-09-24 NOTE — H&P (Signed)
[name] is an 47 y.o. female.  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 Pt admitted for renal biopsy in light of low complements, active urine sediment, and AKI.   Past Medical History  Diagnosis Date  . Nonspecific abnormal results of liver function study   . Personal history of venous thrombosis and embolism 2002    during pregnancy; ?factor 5 leiden (sees heme)  . Edema   . 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 Nephritis and nephropathy, not specified as acute or chronic, with unspecified pathological lesion in kidney   . Pure hypercholesterolemia   . Unspecified essential hypertension   . Other specified acquired hypothyroidism   . Obesity, unspecified   . Panic disorder without agoraphobia   . Unspecified pleural effusion   . Other pulmonary embolism and infarction   . 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 Pleural effusion 2005    c/w lupus initial w/u; recurrent Right as pred tapered off July 2011  . HLD (hyperlipidemia)   . Iron deficiency anemia     IV dextran [name]  . 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 GERD (gastroesophageal reflux disease)     on prilosec r/t gastric sleeve surgery  . Arthritis     Lupus - hands/knees  . Anxiety state, unspecified     panic attacks  . Sleep apnea     Allergies:  Allergies  Allergen Reactions  . Amoxicillin     REACTION: Pt. reports allergy, reaction not known  . Atorvastatin     REACTION: Reaction not known  . Moxifloxacin     REACTION: hallucinations    Medications: {medication reviewed/display:[id number] No results found for this or any previous visit (from the past 48 hour(s)).  No results found.  Blood  pressure 155/103, pulse 83, temperature 98 F (36.7 C), temperature source Oral, resp. rate 17, height 5\' 5"  (1.651 m), weight 117.028 kg (258 lb), SpO2 98.00%. General appearance: cooperative and no distress Head: Normocephalic, without obvious abnormality, atraumatic Resp: clear to auscultation bilaterally Cardio: regular rate and rhythm, S1, S2 normal, no murmur, click, rub or gallop GI: soft, non-tender; bowel sounds normal; no masses,  no organomegaly Extremities: edema trace  Assessment/Plan: 1. lupus nephritis with AKI- s/p renal biopsy and will admit for 23hour observation.  Appreciate Dr. Dereck Price assistance 2. HTN- cont with labetolol 3. Hypothyroidism- on levothyroxine 4. Factor V Leiden- to start lovenox tomorrow.  [name] A 09/24/2012, 12:41 PM

## 2012-09-24 NOTE — Procedures (Signed)
[place] RLP renal core bx 16g x2 to surg path No complication No blood loss. See complete dictation in [facility].

## 2012-09-24 NOTE — ED Notes (Cosign Needed)
Unable to obtain good specimen.  Dr Abel Presto consulting with Dr Deanne Coffer.  Dr Rica Records here to attempt.

## 2012-09-25 LAB — CBC
HCT: 31.1 % — ABNORMAL LOW (ref 36.0–46.0)
MCH: 28.8 pg (ref 26.0–34.0)
MCV: 88.6 fL (ref 78.0–100.0)
Platelets: 142 10*3/uL — ABNORMAL LOW (ref 150–400)
RDW: 14.1 % (ref 11.5–15.5)
WBC: 3.8 10*3/uL — ABNORMAL LOW (ref 4.0–10.5)

## 2012-09-25 NOTE — Progress Notes (Signed)
Discharge instructions gone over with patient. Home medications gone over. Follow up appointment to be made. Diet, activity, and incisional care discussed.  Symptoms requiring immediate attention or 911 discussed. Reasons to call the doctor gone over. Patient verbalized understanding of instructions.

## 2012-09-25 NOTE — Progress Notes (Signed)
Utilization review completed.  

## 2012-09-25 NOTE — Discharge Summary (Signed)
Physician Discharge Summary  Patient ID: [name] MRN: [id number] DOB/AGE: 02/28/1965 47 y.o.  Admit date: 09/24/2012 Discharge date: 09/25/2012  Admission Diagnoses:Lupus Nephritis  Discharge Diagnoses: Lupus nephritis Active Problems:   * No active hospital problems. *   Discharged Condition: good  Hospital Course: Pt admitted 09/24/12 for renal biopsy.  Difficult procedure but after 6 passes were able to obtain 1 and 1/2 core samples with the assistance of Dr. Deanne Coffer.  Pt tolerated procedure well without any evidence of bleeding post procedure and steady Hgb.  Pt to start lovenox today to bridge her factor V leiden deficiency.  Treatments: procedures: biopsy: right kidney  Blood pressure 143/87, pulse 84, temperature 97.5 F (36.4 C), temperature source Oral, resp. rate 18, height 5\' 5"  (1.651 m), weight 117.028 kg (258 lb), SpO2 100.00%.  Disposition: 01-Home or Self Care   Future Appointments Provider Department Dept Phone   09/29/2012 8:30 AM [facility] [phone]   10/08/2012 9:00 AM [facility] [phone]   12/06/2012 2:30 PM [facility] [phone]   12/06/2012 3:00 PM Levert Feinstein, MD [facility] [phone]       Medication List    ASK your doctor about these medications       labetalol 200 MG tablet  Commonly known as:  NORMODYNE  Take 0.5 tablets (100 mg total) by mouth 2 (two) times daily.     levothyroxine 175 MCG tablet  Commonly known as:  SYNTHROID, LEVOTHROID  Take 175 mcg by mouth daily before breakfast.     mycophenolate 500 MG tablet  Commonly known as:  CELLCEPT  Take 500 mg by mouth 2 (two) times daily.     omeprazole 20 MG capsule  Commonly known as:  PRILOSEC  Take 20 mg by mouth daily.     PARoxetine 40 MG tablet  Commonly known as:  PAXIL   Take 1 tablet (40 mg total) by mouth every morning.     prednisoLONE 5 MG Tabs tablet  Take 15 mg by mouth daily.     warfarin 2.5 MG tablet  Commonly known as:  COUMADIN  Take as directed by coumadin clinic.     warfarin 5 MG tablet  Commonly known as:  COUMADIN  Take 1 tablet (5 mg total) by mouth daily. Take as directed by coumadin clinic         Signed: Emilea Goga A 09/25/2012, 12:10 PM

## 2012-09-29 ENCOUNTER — Ambulatory Visit (INDEPENDENT_AMBULATORY_CARE_PROVIDER_SITE_OTHER): Payer: Managed Care, Other (non HMO) | Admitting: General Practice

## 2012-09-29 ENCOUNTER — Other Ambulatory Visit: Payer: Self-pay | Admitting: General Practice

## 2012-09-29 DIAGNOSIS — Z7902 Long term (current) use of antithrombotics/antiplatelets: Secondary | ICD-10-CM

## 2012-09-29 DIAGNOSIS — Z86718 Personal history of other venous thrombosis and embolism: Secondary | ICD-10-CM

## 2012-09-29 DIAGNOSIS — Z5181 Encounter for therapeutic drug level monitoring: Secondary | ICD-10-CM

## 2012-09-29 DIAGNOSIS — I2699 Other pulmonary embolism without acute cor pulmonale: Secondary | ICD-10-CM

## 2012-09-29 MED ORDER — ENOXAPARIN SODIUM 120 MG/0.8ML ~~LOC~~ SOLN: 1.0000 mg/kg | Freq: Two times a day (BID) | SUBCUTANEOUS | Status: DC

## 2012-09-30 ENCOUNTER — Telehealth: Payer: Self-pay | Admitting: General Practice

## 2012-09-30 NOTE — Telephone Encounter (Signed)
Phoned patient about switching from Lovenox to Arixtra.  Patient wants to continue to Lovenox.

## 2012-09-30 NOTE — Telephone Encounter (Signed)
Message copied by Garrison Columbus on Thu Sep 30, 2012  9:15 AM ------      Message from: Roxy Manns A      Created: Thu Sep 30, 2012  9:06 AM      Regarding: FW: anticoagulation question                   ----- Message -----         From: Levert Feinstein, MD         Sent: 09/29/2012   6:22 PM           To: Judy Pimple, MD      Subject: RE: anticoagulation question                             She could try arixtra (fondaparinux) 5mg  daily (I believe she has renal dysfunction so would use this usually prophylactic dose).      ----- Message -----         From: Judy Pimple, MD         Sent: 09/29/2012  10:15 AM           To: Garrison Columbus, RN, Levert Feinstein, MD      Subject: anticoagulation question                                 Good question - I am not really sure what would be next given her history- I'm going to forward this to Dr Cyndie Chime if that is ok       ----- Message -----         From: Garrison Columbus, RN         Sent: 09/29/2012   9:32 AM           To: Judy Pimple, MD            Hi Dr. Milinda Antis,            I just saw patient for INR check.  She is doing a Lovenox bridge and says that it is making her itch. She is taking benadryl to help manage the itching but she has to take this Lovenox through Monday.  Is there anything else other that Lovenox she could take?              Thanks,      Bailey Mech             ------

## 2012-10-05 ENCOUNTER — Ambulatory Visit (INDEPENDENT_AMBULATORY_CARE_PROVIDER_SITE_OTHER): Payer: Managed Care, Other (non HMO) | Admitting: General Practice

## 2012-10-05 ENCOUNTER — Other Ambulatory Visit: Payer: Self-pay | Admitting: Critical Care Medicine

## 2012-10-05 DIAGNOSIS — Z5181 Encounter for therapeutic drug level monitoring: Secondary | ICD-10-CM

## 2012-10-05 DIAGNOSIS — I2699 Other pulmonary embolism without acute cor pulmonale: Secondary | ICD-10-CM

## 2012-10-05 DIAGNOSIS — D6859 Other primary thrombophilia: Secondary | ICD-10-CM

## 2012-10-05 DIAGNOSIS — Z86718 Personal history of other venous thrombosis and embolism: Secondary | ICD-10-CM

## 2012-10-05 DIAGNOSIS — Z7901 Long term (current) use of anticoagulants: Secondary | ICD-10-CM

## 2012-10-05 DIAGNOSIS — D6851 Activated protein C resistance: Secondary | ICD-10-CM

## 2012-10-05 DIAGNOSIS — Z7902 Long term (current) use of antithrombotics/antiplatelets: Secondary | ICD-10-CM

## 2012-10-06 DIAGNOSIS — Z7901 Long term (current) use of anticoagulants: Secondary | ICD-10-CM | POA: Insufficient documentation

## 2012-10-06 NOTE — Telephone Encounter (Signed)
Per last OV note with PW on 06/07/12:  Patient Instructions    Contact Dr Kellie Simmering regarding further changes to prednisone while on cellcept  No medication changes  We will get [employer] to offer mask choices for cpap  Return 6 months   ------  Prednisone rx will need to be deferred to Dr. Kellie Simmering.

## 2012-10-08 ENCOUNTER — Ambulatory Visit: Payer: Managed Care, Other (non HMO)

## 2012-10-15 ENCOUNTER — Telehealth: Payer: Self-pay

## 2012-10-15 NOTE — Telephone Encounter (Signed)
[name] with [facility] left v/m requesting copy of [facility] notes from DOS 05/16/2011, 05/10/2012, and 08/11/2012 Dr Arrie Aran;  [facility] office has misplaced notes. Fax # [phone].Please advise.

## 2012-10-16 ENCOUNTER — Other Ambulatory Visit: Payer: Self-pay | Admitting: Family Medicine

## 2012-11-02 ENCOUNTER — Other Ambulatory Visit: Payer: Self-pay | Admitting: Family Medicine

## 2012-11-02 ENCOUNTER — Ambulatory Visit (INDEPENDENT_AMBULATORY_CARE_PROVIDER_SITE_OTHER): Payer: Managed Care, Other (non HMO) | Admitting: General Practice

## 2012-11-02 DIAGNOSIS — Z86718 Personal history of other venous thrombosis and embolism: Secondary | ICD-10-CM

## 2012-11-02 DIAGNOSIS — I2699 Other pulmonary embolism without acute cor pulmonale: Secondary | ICD-10-CM

## 2012-11-02 DIAGNOSIS — D6859 Other primary thrombophilia: Secondary | ICD-10-CM

## 2012-11-02 DIAGNOSIS — Z5181 Encounter for therapeutic drug level monitoring: Secondary | ICD-10-CM

## 2012-11-02 DIAGNOSIS — Z7902 Long term (current) use of antithrombotics/antiplatelets: Secondary | ICD-10-CM

## 2012-11-02 DIAGNOSIS — Z7901 Long term (current) use of anticoagulants: Secondary | ICD-10-CM

## 2012-11-02 DIAGNOSIS — D6851 Activated protein C resistance: Secondary | ICD-10-CM

## 2012-11-02 LAB — POCT INR: INR: 1.2

## 2012-11-03 ENCOUNTER — Other Ambulatory Visit: Payer: Self-pay

## 2012-11-03 MED ORDER — LABETALOL HCL 200 MG PO TABS: ORAL_TABLET | ORAL | Status: DC

## 2012-11-03 NOTE — Telephone Encounter (Signed)
Pt saw Dr Malachi Bonds and Labetalol 200 mg was increased tp 1 1/2 tab twice a day. Pt is completely out of med and pt has appt with Dr Milinda Antis on 11/05/12. Pt request refill to [facility] on [address] today.

## 2012-11-03 NOTE — Telephone Encounter (Signed)
I will send it now

## 2012-11-05 ENCOUNTER — Encounter: Payer: Self-pay | Admitting: Family Medicine

## 2012-11-05 ENCOUNTER — Ambulatory Visit (INDEPENDENT_AMBULATORY_CARE_PROVIDER_SITE_OTHER): Payer: Managed Care, Other (non HMO) | Admitting: Family Medicine

## 2012-11-05 VITALS — BP 122/76 | HR 64 | Temp 98.9°F | Ht 65.0 in | Wt 266.5 lb

## 2012-11-05 DIAGNOSIS — E038 Other specified hypothyroidism: Secondary | ICD-10-CM

## 2012-11-05 DIAGNOSIS — I10 Essential (primary) hypertension: Secondary | ICD-10-CM

## 2012-11-05 DIAGNOSIS — E7800 Pure hypercholesterolemia, unspecified: Secondary | ICD-10-CM

## 2012-11-05 LAB — COMPREHENSIVE METABOLIC PANEL
Albumin: 3.3 g/dL — ABNORMAL LOW (ref 3.5–5.2)
BUN: 25 mg/dL — ABNORMAL HIGH (ref 6–23)
Calcium: 8.6 mg/dL (ref 8.4–10.5)
Chloride: 109 mEq/L (ref 96–112)
Creat: 1.43 mg/dL — ABNORMAL HIGH (ref 0.50–1.10)
Glucose, Bld: 75 mg/dL (ref 70–99)
Potassium: 5 mEq/L (ref 3.5–5.3)

## 2012-11-05 LAB — LIPID PANEL
Cholesterol: 265 mg/dL — ABNORMAL HIGH (ref 0–200)
Triglycerides: 179 mg/dL — ABNORMAL HIGH (ref <150)

## 2012-11-05 MED ORDER — LABETALOL HCL 200 MG PO TABS: ORAL_TABLET | ORAL | Status: DC

## 2012-11-05 MED ORDER — LEVOTHYROXINE SODIUM 175 MCG PO TABS: 175.0000 ug | ORAL_TABLET | Freq: Every day | ORAL | Status: DC

## 2012-11-05 MED ORDER — PAROXETINE HCL 40 MG PO TABS: 40.0000 mg | ORAL_TABLET | Freq: Every morning | ORAL | Status: DC

## 2012-11-05 NOTE — Progress Notes (Signed)
Subjective:    Patient ID: [name], female    DOB: 12-31-1965, 47 y.o.   MRN: [id number]  HPI Here for f/u of chronic health problems   Had a recent kidney bx -has active SLE- on cellcept  Dr Kellie Simmering added plaquenil as well  Hair is falling out again  Still on prednisone -alt between 10 and 15 mg   Wt is up 8lb with bmi of 44 She is trying to eat healthy and take care of herself - eating regular meals  A lot of walking at work - makes effort to walk as much as she can   Mood is pretty good   bp is stable today (had been a bit high at the renal visit ) - and inc her normodyne to 1 1/2 bid  No cp or palpitations or headaches or edema  No side effects to medicines  BP Readings from Last 3 Encounters:  11/05/12 122/76  09/25/12 143/87  06/07/12 142/80      Hyperlipidemia Lab Results  Component Value Date   CHOL 266* 06/03/2011   HDL 48.70 06/03/2011   LDLCALC  Value: 192        Total Cholesterol/HDL:CHD Risk Coronary Heart Disease Risk Table                     Men   Women  1/2 Average Risk   3.4   3.3  Average Risk       5.0   4.4  2 X Average Risk   9.6   7.1  3 X Average Risk  23.4   11.0        Use the calculated Patient Ratio above and the CHD Risk Table to determine the patient's CHD Risk.        ATP III CLASSIFICATION (LDL):  <100     mg/dL   Optimal  188-416  mg/dL   Near or Above                    Optimal  130-159  mg/dL   Borderline  606-301  mg/dL   High  >601     mg/dL   Very High* 0/93/2355   LDLDIRECT 203.7 06/03/2011   TRIG 113.0 06/03/2011   CHOLHDL 5 06/03/2011    Hypothyroid -needs a check   Anxiety- on paxil - doing well on that   Will need to come back for a physical for her [employer]  Needs it for [employer]   Patient Active Problem List   Diagnosis Date Noted  . Long term (current) use of anticoagulants 10/06/2012  . AKI (acute kidney injury) 04/24/2012  . Chronic anticoagulation 04/22/2012  . S/P bariatric surgery 04/22/2012  . ICH, presumed  hypertensive 04/22/2012  . Factor V Leiden 04/22/2012  . Dark urine 04/16/2012  . Urine abnormality 04/16/2012  . Neck pain 02/23/2012  . OSA on CPAP 11/20/2011  . Hyperglycemia 09/16/2010  . EDEMA 01/08/2010  . PLEURAL EFFUSION 08/27/2009  . ENLARGEMENT OF LYMPH NODES 08/13/2009  . Hx of PE 05/24/2009  . OBESITY 12/08/2007  . HYPOTHYROIDISM, POSTABLATION 06/09/2006  . HYPERCHOLESTEROLEMIA 06/09/2006  . ANXIETY 06/09/2006  . PANIC DISORDER 06/09/2006  . HYPERTENSION, ESSENTIAL NOS 06/09/2006  . GLOMERULONEPHRITIS 06/09/2006  . Systemic lupus erythematosus 06/09/2006  . DVT, HX OF 06/09/2006   Past Medical History  Diagnosis Date  . Nonspecific abnormal results of liver function study   . Personal history of venous thrombosis and  embolism 2002    during pregnancy; ?factor 5 leiden (sees heme)  . Edema   . 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 Nephritis and nephropathy, not specified as acute or chronic, with unspecified pathological lesion in kidney   . Pure hypercholesterolemia   . Unspecified essential hypertension   . Other specified acquired hypothyroidism   . Obesity, unspecified   . Panic disorder without agoraphobia   . Unspecified pleural effusion   . Other pulmonary embolism and infarction   . 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 Pleural effusion 2005    c/w lupus initial w/u; recurrent Right as pred tapered off July 2011  . HLD (hyperlipidemia)   . Iron deficiency anemia     IV dextran [place]  . 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 GERD (gastroesophageal reflux disease)     on prilosec r/t gastric sleeve surgery  . Arthritis     Lupus - hands/knees  . Anxiety state, unspecified     panic attacks  . Sleep apnea    Past Surgical History  Procedure Laterality Date  . Pleuryx catheter placement  9/11     VanTright----removed 9/11  .  Thoracentesis  2010    with penumonia  . Pleurocentesis  10/2004  . Gastric sleeve  8/12    bariatric surgery Dr Adolphus Birchwood   . Wisdom tooth extraction    . Svd      x 3  . Laparoscopic tubal ligation  12/09/2010    Procedure: LAPAROSCOPIC TUBAL LIGATION;  Surgeon: Juluis Mire;  Location: [facility];  Service: Gynecology;  Laterality: Bilateral;  Attempted see Nursing note  . Renal biopsy  09/24/2012   History  Substance Use Topics  . Smoking status: Former Smoker -- 0.50 packs/day for 10 years    Types: Cigarettes    Quit date: 05/04/2009  . Smokeless tobacco: Never Used     Comment: Socially x10 years (1 pack/month)  . Alcohol Use: Yes     Comment: socially   Family History  Problem Relation Age of Onset  . Lupus Sister   . Diabetes      GM   Allergies  Allergen Reactions  . Amoxicillin     REACTION: Pt. reports allergy, reaction not known  . Atorvastatin     REACTION: Reaction not known  . Moxifloxacin     REACTION: hallucinations   Current Outpatient Prescriptions on File Prior to Visit  Medication Sig Dispense Refill  . labetalol (NORMODYNE) 200 MG tablet Take 1 and 1/2 tablets  by mouth twice a day  90 tablet  3  . levothyroxine (SYNTHROID, LEVOTHROID) 175 MCG tablet Take 175 mcg by mouth daily before breakfast.      . mycophenolate (CELLCEPT) 500 MG tablet Take 500 mg by mouth 2 (two) times daily.      Marland Kitchen omeprazole (PRILOSEC) 20 MG capsule Take 20 mg by mouth daily.      Marland Kitchen PARoxetine (PAXIL) 40 MG tablet Take 1 tablet (40 mg total) by mouth every morning.  30 tablet  2  . prednisoLONE 5 MG TABS tablet Take 15 mg by mouth daily.      Marland Kitchen warfarin (COUMADIN) 2.5 MG tablet Take as directed by coumadin clinic.  90 tablet  0  . warfarin (COUMADIN) 5 MG tablet Take 1 tablet (5 mg total)  by mouth daily. Take as directed by coumadin clinic  10 tablet  0   No current facility-administered medications on file prior to visit.    Review of Systems Review of Systems   Constitutional: Negative for fever, appetite change, and unexpected weight change.  Eyes: Negative for pain and visual disturbance.  Respiratory: Negative for cough and shortness of breath.   Cardiovascular: Negative for cp or palpitations    Gastrointestinal: Negative for nausea, diarrhea and constipation.  Genitourinary: Negative for urgency and frequency.  Skin: Negative for pallor or rash   Neurological: Negative for weakness, light-headedness, numbness and headaches.  Hematological: Negative for adenopathy. Does not bruise/bleed easily.  Psychiatric/Behavioral: Negative for dysphoric mood. The patient is not nervous/anxious.         Objective:   Physical Exam  Constitutional: She appears well-developed and well-nourished. No distress.  obese and well appearing   HENT:  Head: Normocephalic and atraumatic.  Right Ear: External ear normal.  Left Ear: External ear normal.  Nose: Nose normal.  Mouth/Throat: Oropharynx is clear and moist.  Eyes: Conjunctivae and EOM are normal. Pupils are equal, round, and reactive to light. Right eye exhibits no discharge. Left eye exhibits no discharge. No scleral icterus.  Neck: Normal range of motion. Neck supple. No JVD present. Carotid bruit is not present. No thyromegaly present.  Cardiovascular: Normal rate, regular rhythm, normal heart sounds and intact distal pulses.  Exam reveals no gallop.   Pulmonary/Chest: Effort normal and breath sounds normal. No respiratory distress. She has no wheezes.  Abdominal: Soft. Bowel sounds are normal. She exhibits no distension, no abdominal bruit and no mass. There is no tenderness.  Musculoskeletal: She exhibits edema. She exhibits no tenderness.  Trace pedal edema   Lymphadenopathy:    She has no cervical adenopathy.  Neurological: She is alert. She has normal reflexes. No cranial nerve deficit. She exhibits normal muscle tone. Coordination normal.  Skin: Skin is warm and dry. No rash noted.   Psychiatric: She has a normal mood and affect.          Assessment & Plan:

## 2012-11-05 NOTE — Patient Instructions (Signed)
Keep working on healthy diet and exercise  No change in medicines Labs today Schedule a PE in November (any 30 min slot)

## 2012-11-06 LAB — TSH: TSH: 1.943 u[IU]/mL (ref 0.350–4.500)

## 2012-11-06 LAB — CBC WITH DIFFERENTIAL/PLATELET
Eosinophils Relative: 1 % (ref 0–5)
HCT: 29.1 % — ABNORMAL LOW (ref 36.0–46.0)
Hemoglobin: 9.8 g/dL — ABNORMAL LOW (ref 12.0–15.0)
Lymphocytes Relative: 11 % — ABNORMAL LOW (ref 12–46)
MCHC: 33.7 g/dL (ref 30.0–36.0)
MCV: 83.1 fL (ref 78.0–100.0)
Monocytes Absolute: 0.3 10*3/uL (ref 0.1–1.0)
Monocytes Relative: 6 % (ref 3–12)
Neutro Abs: 4.4 10*3/uL (ref 1.7–7.7)
WBC: 5.3 10*3/uL (ref 4.0–10.5)

## 2012-11-07 NOTE — Assessment & Plan Note (Signed)
tsh today and update  No clinical changes

## 2012-11-07 NOTE — Assessment & Plan Note (Signed)
bp in fair control at this time  No changes needed  Disc lifstyle change with low sodium diet and exercise   Improved with inc in normodyne  Labs today

## 2012-11-07 NOTE — Assessment & Plan Note (Signed)
Lipids today and update Much imp diet since her bariatric surgery

## 2012-11-11 ENCOUNTER — Ambulatory Visit: Payer: Managed Care, Other (non HMO)

## 2012-11-11 ENCOUNTER — Other Ambulatory Visit: Payer: Self-pay | Admitting: Family Medicine

## 2012-11-12 ENCOUNTER — Ambulatory Visit (INDEPENDENT_AMBULATORY_CARE_PROVIDER_SITE_OTHER): Payer: Managed Care, Other (non HMO) | Admitting: General Practice

## 2012-11-12 DIAGNOSIS — D6851 Activated protein C resistance: Secondary | ICD-10-CM

## 2012-11-12 DIAGNOSIS — I2699 Other pulmonary embolism without acute cor pulmonale: Secondary | ICD-10-CM

## 2012-11-12 DIAGNOSIS — Z86718 Personal history of other venous thrombosis and embolism: Secondary | ICD-10-CM

## 2012-11-12 DIAGNOSIS — D6859 Other primary thrombophilia: Secondary | ICD-10-CM

## 2012-11-12 DIAGNOSIS — Z5181 Encounter for therapeutic drug level monitoring: Secondary | ICD-10-CM

## 2012-11-12 DIAGNOSIS — Z7902 Long term (current) use of antithrombotics/antiplatelets: Secondary | ICD-10-CM

## 2012-11-12 DIAGNOSIS — Z7901 Long term (current) use of anticoagulants: Secondary | ICD-10-CM

## 2012-11-19 ENCOUNTER — Ambulatory Visit (INDEPENDENT_AMBULATORY_CARE_PROVIDER_SITE_OTHER): Payer: Managed Care, Other (non HMO) | Admitting: General Practice

## 2012-11-19 DIAGNOSIS — Z7901 Long term (current) use of anticoagulants: Secondary | ICD-10-CM

## 2012-11-19 DIAGNOSIS — Z86718 Personal history of other venous thrombosis and embolism: Secondary | ICD-10-CM

## 2012-11-19 DIAGNOSIS — I2699 Other pulmonary embolism without acute cor pulmonale: Secondary | ICD-10-CM

## 2012-11-19 DIAGNOSIS — Z7902 Long term (current) use of antithrombotics/antiplatelets: Secondary | ICD-10-CM

## 2012-11-19 DIAGNOSIS — Z5181 Encounter for therapeutic drug level monitoring: Secondary | ICD-10-CM

## 2012-11-19 DIAGNOSIS — D6859 Other primary thrombophilia: Secondary | ICD-10-CM

## 2012-11-19 DIAGNOSIS — D6851 Activated protein C resistance: Secondary | ICD-10-CM

## 2012-12-03 ENCOUNTER — Ambulatory Visit (INDEPENDENT_AMBULATORY_CARE_PROVIDER_SITE_OTHER): Payer: Managed Care, Other (non HMO) | Admitting: General Practice

## 2012-12-03 DIAGNOSIS — D6851 Activated protein C resistance: Secondary | ICD-10-CM

## 2012-12-03 DIAGNOSIS — I2699 Other pulmonary embolism without acute cor pulmonale: Secondary | ICD-10-CM

## 2012-12-03 DIAGNOSIS — Z7901 Long term (current) use of anticoagulants: Secondary | ICD-10-CM

## 2012-12-03 DIAGNOSIS — D6859 Other primary thrombophilia: Secondary | ICD-10-CM

## 2012-12-03 DIAGNOSIS — Z5181 Encounter for therapeutic drug level monitoring: Secondary | ICD-10-CM

## 2012-12-03 DIAGNOSIS — Z7902 Long term (current) use of antithrombotics/antiplatelets: Secondary | ICD-10-CM

## 2012-12-03 DIAGNOSIS — Z86718 Personal history of other venous thrombosis and embolism: Secondary | ICD-10-CM

## 2012-12-03 LAB — POCT INR: INR: 1.5

## 2012-12-05 ENCOUNTER — Other Ambulatory Visit: Payer: Self-pay | Admitting: Family Medicine

## 2012-12-06 ENCOUNTER — Other Ambulatory Visit: Payer: Self-pay | Admitting: General Practice

## 2012-12-06 ENCOUNTER — Ambulatory Visit (HOSPITAL_BASED_OUTPATIENT_CLINIC_OR_DEPARTMENT_OTHER): Payer: Managed Care, Other (non HMO) | Admitting: Oncology

## 2012-12-06 ENCOUNTER — Other Ambulatory Visit (HOSPITAL_BASED_OUTPATIENT_CLINIC_OR_DEPARTMENT_OTHER): Payer: Managed Care, Other (non HMO) | Admitting: Lab

## 2012-12-06 VITALS — BP 146/87 | HR 64 | Temp 97.2°F | Resp 18 | Ht 65.0 in | Wt 270.3 lb

## 2012-12-06 DIAGNOSIS — Z7901 Long term (current) use of anticoagulants: Secondary | ICD-10-CM

## 2012-12-06 DIAGNOSIS — D6859 Other primary thrombophilia: Secondary | ICD-10-CM

## 2012-12-06 DIAGNOSIS — I619 Nontraumatic intracerebral hemorrhage, unspecified: Secondary | ICD-10-CM

## 2012-12-06 DIAGNOSIS — N179 Acute kidney failure, unspecified: Secondary | ICD-10-CM

## 2012-12-06 DIAGNOSIS — N058 Unspecified nephritic syndrome with other morphologic changes: Secondary | ICD-10-CM

## 2012-12-06 DIAGNOSIS — M329 Systemic lupus erythematosus, unspecified: Secondary | ICD-10-CM

## 2012-12-06 DIAGNOSIS — D6851 Activated protein C resistance: Secondary | ICD-10-CM

## 2012-12-06 DIAGNOSIS — N059 Unspecified nephritic syndrome with unspecified morphologic changes: Secondary | ICD-10-CM

## 2012-12-06 DIAGNOSIS — Z86718 Personal history of other venous thrombosis and embolism: Secondary | ICD-10-CM

## 2012-12-06 LAB — CBC WITH DIFFERENTIAL/PLATELET
BASO%: 0.8 % (ref 0.0–2.0)
Basophils Absolute: 0 10*3/uL (ref 0.0–0.1)
EOS%: 0.5 % (ref 0.0–7.0)
Eosinophils Absolute: 0 10*3/uL (ref 0.0–0.5)
LYMPH%: 9.1 % — ABNORMAL LOW (ref 14.0–49.7)
MCH: 28.4 pg (ref 25.1–34.0)
MCV: 88 fL (ref 79.5–101.0)
MONO%: 5.5 % (ref 0.0–14.0)
NEUT#: 4.1 10*3/uL (ref 1.5–6.5)
Platelets: 191 10*3/uL (ref 145–400)
RBC: 3.59 10*6/uL — ABNORMAL LOW (ref 3.70–5.45)

## 2012-12-06 LAB — PROTIME-INR: Protime: 15.6 Seconds — ABNORMAL HIGH (ref 10.6–13.4)

## 2012-12-06 LAB — TECHNOLOGIST REVIEW

## 2012-12-06 LAB — COMPREHENSIVE METABOLIC PANEL (CC13)
AST: 24 U/L (ref 5–34)
Albumin: 3 g/dL — ABNORMAL LOW (ref 3.5–5.0)
Alkaline Phosphatase: 57 U/L (ref 40–150)
Anion Gap: 8 mEq/L (ref 3–11)
BUN: 21.4 mg/dL (ref 7.0–26.0)
CO2: 23 mEq/L (ref 22–29)
Creatinine: 1.1 mg/dL (ref 0.6–1.1)
Glucose: 89 mg/dl (ref 70–140)
Potassium: 5.1 mEq/L (ref 3.5–5.1)
Sodium: 140 mEq/L (ref 136–145)
Total Protein: 6.3 g/dL — ABNORMAL LOW (ref 6.4–8.3)

## 2012-12-06 LAB — SEDIMENTATION RATE: Sed Rate: 49 mm/hr — ABNORMAL HIGH (ref 0–22)

## 2012-12-06 MED ORDER — WARFARIN SODIUM 7.5 MG PO TABS: ORAL_TABLET | ORAL | Status: DC

## 2012-12-06 NOTE — Progress Notes (Signed)
Hematology and Oncology Follow Up Visit  [name] [id number] 05-20-1965 47 y.o. 12/06/2012 7:08 PM   Principle Diagnosis: Hypercoagulable state due to active collagen vascular disorder and homozygous status for the factor V Leiden gene mutation   Interim History:   Followup visit for this pleasant 47 year old woman with systemic lupus. Complications to date have included unprovoked pulmonary emboli and glomerulonephritis. Since last visit with me, she had a kidney biopsy on 09/24/2012, which confirmed active lupus related proliferative glomerulonephritis. She is currently on triple immunosuppressive therapy with Plaquenil, CellCept, and prednisone. She continues on chronic anticoagulation with Coumadin. She is subtherapeutic today. She was just in to see her primary care 2 days ago and was also subtherapeutic and her dose was increased. Too early to see the effect of this change today.  Joint symptoms of her lupus are currently well controlled on her current regimen.  Medications: reviewed  Allergies:  Allergies  Allergen Reactions  . Amoxicillin     REACTION: Pt. reports allergy, reaction not known  . Atorvastatin     REACTION: Reaction not known  . Lovenox [Enoxaparin Sodium] Itching  . Moxifloxacin     REACTION: hallucinations    Review of Systems: Hematology: negative for swollen glands, positive easy bruising,  ENT ROS: negative for - oral lesions or sore throat Breast ROS:  Respiratory ROS: negative for - cough, pleuritic pain, shortness of breath or wheezing Cardiovascular ROS: negative for - chest pain, dyspnea on exertion, edema, irregular heartbeat, murmur, orthopnea, palpitations, paroxysmal nocturnal dyspnea or rapid heart rate Gastrointestinal ROS: negative for - abdominal pain, appetite loss, blood in stools, change in bowel habits, constipation, diarrhea, heartburn, hematemesis, melena, nausea/vomiting or swallowing difficulty/pain Genito-Urinary ROS: negative  for -  dysuria, hematuria, incontinence,  or urinary frequency/urgency Musculoskeletal ROS: Lupus currently quiet with low level polyarthralgias. Neurological ROS: negative for - behavioral changes, confusion, dizziness, gait disturbance, headaches, impaired coordination/balance, memory loss, numbness/tingling,  Dermatological ROS: negative for rash, ecchymosis Remaining ROS negative.  Physical Exam: Blood pressure 146/87, pulse 64, temperature 97.2 F (36.2 C), temperature source Oral, resp. rate 18, height 5\' 5"  (1.651 m), weight 270 lb 4.8 oz (122.607 kg). Wt Readings from Last 3 Encounters:  12/06/12 270 lb 4.8 oz (122.607 kg)  11/05/12 266 lb 8 oz (120.884 kg)  09/24/12 258 lb (117.028 kg)     General appearance: Overweight Caucasian woman HENNT: Pharynx no erythema, exudate, mass, or ulcer. No thyromegaly or thyroid nodules Lymph nodes: No cervical, supraclavicular, or axillary lymphadenopathy Breasts:  Lungs: Clear to auscultation, resonant to percussion throughout Heart: Regular rhythm, no murmur, no gallop, no rub, no click, no edema Abdomen: Soft, nontender, normal bowel sounds, no mass, no organomegaly Extremities: No edema, no calf tenderness Musculoskeletal: no joint deformities GU: Vascular: Carotid pulses 2+, no bruits, Neurologic: Alert, oriented, PERRLA, optic discs sharp and vessels normal, no hemorrhage or exudate, cranial nerves grossly normal, motor strength 5 over 5, reflexes 1+ symmetric, upper body coordination normal, gait normal, Skin: No rash or ecchymosis  Lab Results: CBC W/Diff    Component Value Date/Time   WBC 4.8 12/06/2012 1443   WBC 5.3 11/05/2012 1648   RBC 3.59* 12/06/2012 1443   RBC 3.50* 11/05/2012 1648   HGB 10.2* 12/06/2012 1443   HGB 9.8* 11/05/2012 1648   HCT 31.5* 12/06/2012 1443   HCT 29.1* 11/05/2012 1648   PLT 191 12/06/2012 1443   PLT 230 11/05/2012 1648   MCV 88.0 12/06/2012 1443   MCV 83.1 11/05/2012 1648  MCH 28.4 12/06/2012 1443    MCH 28.0 11/05/2012 1648   MCHC 32.3 12/06/2012 1443   MCHC 33.7 11/05/2012 1648   RDW 13.9 12/06/2012 1443   RDW 14.3 11/05/2012 1648   LYMPHSABS 0.4* 12/06/2012 1443   LYMPHSABS 0.6* 11/05/2012 1648   MONOABS 0.3 12/06/2012 1443   MONOABS 0.3 11/05/2012 1648   EOSABS 0.0 12/06/2012 1443   EOSABS 0.0 11/05/2012 1648   BASOSABS 0.0 12/06/2012 1443   BASOSABS 0.0 11/05/2012 1648     Chemistry      Component Value Date/Time   NA 140 12/06/2012 1444   NA 139 11/05/2012 1648   K 5.1 12/06/2012 1444   K 5.0 11/05/2012 1648   CL 109 11/05/2012 1648   CO2 23 12/06/2012 1444   CO2 25 11/05/2012 1648   BUN 21.4 12/06/2012 1444   BUN 25* 11/05/2012 1648   CREATININE 1.1 12/06/2012 1444   CREATININE 1.43* 11/05/2012 1648   CREATININE 1.41* 09/22/2012 0952   CREATININE 1.97* 04/25/2012 0800      Component Value Date/Time   CALCIUM 8.7 12/06/2012 1444   CALCIUM 8.6 11/05/2012 1648   ALKPHOS 57 12/06/2012 1444   ALKPHOS 60 11/05/2012 1648   AST 24 12/06/2012 1444   AST 16 11/05/2012 1648   ALT 13 12/06/2012 1444   ALT 12 11/05/2012 1648   BILITOT <0.20 12/06/2012 1444   BILITOT 0.2* 11/05/2012 1648       Impression:  #1. Multifactorial hypercoagulable state secondary to active lupus and homozygous status for factor V Leiden gene mutation  #2. Status post recurrent thrombotic events:  Initial right lower extremity DVT 20 years ago 4 weeks postpartum after her first pregnancy. Unprovoked pulmonary embolus 2010 Prophylactic vena cava filter placed approximately 2011 in anticipation of a bariatric surgical procedure for obesity when Coumadin was held. She needs to be on chronic anticoagulation. Currently lab monitoring done through her primary care physician's office. Dose just increased on October 31 Coumadin 11.25 mg daily except 7.5 mg Monday Wednesday Friday. She is therapeutic at this time. I told her to get her protime checked again this week. If she remains therapeutic much longer than she needs to be put on a  Lovenox bridge. She remains at high risk for recurrent events.  #3. Limited, hypertensive, intraventricular hemorrhage 04/29/2012. Complicated by a supratherapeutic INR 4.4. No residual neurologic deficits and I recommended going back on full dose Coumadin once she was neurologically stable.  #4. Chronic pulmonary issues: Four-years  status post surgical drainage of a left empyema. 3 years post chest tube drainage and talc pleurodesis of a right pleural effusion.  #5. Pericardial effusion at same time of the right pleural effusion not requiring drainage.  #6. 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 Obesity status post bariatric surgery  I will see her again in one year but anticipate I will be consulted for other problems related to her coagulation prior to that.    CC: Patient Care Team: Judy Pimple, MD as PCP - General Storm Frisk, MD (Pulmonary Disease)   Levert Feinstein, MD 11/3/20147:08 PM

## 2012-12-06 NOTE — Telephone Encounter (Signed)
Rout to [employer]

## 2012-12-07 ENCOUNTER — Ambulatory Visit (INDEPENDENT_AMBULATORY_CARE_PROVIDER_SITE_OTHER): Payer: Managed Care, Other (non HMO) | Admitting: Family Medicine

## 2012-12-07 ENCOUNTER — Encounter: Payer: Self-pay | Admitting: Family Medicine

## 2012-12-07 ENCOUNTER — Telehealth: Payer: Self-pay | Admitting: Oncology

## 2012-12-07 VITALS — BP 116/68 | HR 75 | Temp 98.6°F | Ht 64.75 in | Wt 269.5 lb

## 2012-12-07 DIAGNOSIS — E038 Other specified hypothyroidism: Secondary | ICD-10-CM

## 2012-12-07 DIAGNOSIS — I10 Essential (primary) hypertension: Secondary | ICD-10-CM

## 2012-12-07 DIAGNOSIS — Z8619 Personal history of other infectious and parasitic diseases: Secondary | ICD-10-CM

## 2012-12-07 DIAGNOSIS — Z13820 Encounter for screening for osteoporosis: Secondary | ICD-10-CM

## 2012-12-07 DIAGNOSIS — R739 Hyperglycemia, unspecified: Secondary | ICD-10-CM

## 2012-12-07 DIAGNOSIS — Z0000 Encounter for general adult medical examination without abnormal findings: Secondary | ICD-10-CM

## 2012-12-07 DIAGNOSIS — R7309 Other abnormal glucose: Secondary | ICD-10-CM

## 2012-12-07 DIAGNOSIS — E039 Hypothyroidism, unspecified: Secondary | ICD-10-CM

## 2012-12-07 DIAGNOSIS — IMO0002 Reserved for concepts with insufficient information to code with codable children: Secondary | ICD-10-CM

## 2012-12-07 DIAGNOSIS — E7800 Pure hypercholesterolemia, unspecified: Secondary | ICD-10-CM

## 2012-12-07 DIAGNOSIS — Z23 Encounter for immunization: Secondary | ICD-10-CM

## 2012-12-07 MED ORDER — ATORVASTATIN CALCIUM 10 MG PO TABS: 10.0000 mg | ORAL_TABLET | Freq: Every day | ORAL | Status: DC

## 2012-12-07 NOTE — Progress Notes (Signed)
Subjective:    Patient ID: [name], female    DOB: [birth year], 47 y.o.   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    Dr Marlena Clipper - wants to keep her on coumadin    Wt is down 1 lb with bmi of 45  Td-unsure when - she will ask Dr Kellie Simmering Pap 4/04-sees dr Arelia Sneddon- pap was in August (was on 6 mo cycle due to HPV)-now back to a yearly schedule   Flu vaccine -has not had it yet-will get it today  She is on small dose of prednisone chronically  She has not had a pneumonia vaccine - will ask Dr Kellie Simmering about that    Mammogram- had her last one about august - she goes , [facility]  Self exam- no lumps   Anemia  Lab Results  Component Value Date   WBC 4.8 12/06/2012   HGB 10.2* 12/06/2012   HCT 31.5* 12/06/2012   MCV 88.0 12/06/2012   PLT 191 12/06/2012   stays anemic with her current health problems - this is actually improved  ? Supposed to be on iron  Is taking B12    Supposed to take ca and D- not religious about it  She has not had a bone density test  Renal insuff   Chemistry      Component Value Date/Time   NA 140 12/06/2012 1444   NA 139 11/05/2012 1648   K 5.1 12/06/2012 1444   K 5.0 11/05/2012 1648   CL 109 11/05/2012 1648   CO2 23 12/06/2012 1444   CO2 25 11/05/2012 1648   BUN 21.4 12/06/2012 1444   BUN 25* 11/05/2012 1648   CREATININE 1.1 12/06/2012 1444   CREATININE 1.43* 11/05/2012 1648   CREATININE 1.41* 09/22/2012 0952   CREATININE 1.97* 04/25/2012 0800      Component Value Date/Time   CALCIUM 8.7 12/06/2012 1444   CALCIUM 8.6 11/05/2012 1648   ALKPHOS 57 12/06/2012 1444   ALKPHOS 60 11/05/2012 1648   AST 24 12/06/2012 1444   AST 16 11/05/2012 1648   ALT 13 12/06/2012  1444   ALT 12 11/05/2012 1648   BILITOT <0.20 12/06/2012 1444   BILITOT 0.2* 11/05/2012 1648    Creatinine is improved!- she sees renal doctor regularly  Glucose was 75 even on prednisone   Hypothyroidism  Pt has no clinical changes No change in energy level/ hair or skin/ edema and no tremor Lab Results  Component Value Date   TSH 1.943 11/05/2012     Hyperlipidemia  Lab Results  Component Value Date   CHOL 265* 11/05/2012   HDL 38* 11/05/2012   LDLCALC 191* 11/05/2012   LDLDIRECT 203.7 06/03/2011   TRIG 179* 11/05/2012   CHOLHDL 7.0 11/05/2012   diet- is quite good - she eats low fat diet  Was intolerant to lipitor ? -unsure   Mood- is good   Patient Active Problem List   Diagnosis Date Noted  . Routine general medical examination at a health care facility 12/07/2012  . Long term (current) use of anticoagulants 10/06/2012  . AKI (acute kidney injury) 04/24/2012  . Chronic anticoagulation 04/22/2012  .  S/P bariatric surgery 04/22/2012  . ICH, presumed hypertensive 04/22/2012  . Factor V Leiden 04/22/2012  . Dark urine 04/16/2012  . Urine abnormality 04/16/2012  . Neck pain 02/23/2012  . OSA on CPAP 11/20/2011  . Hyperglycemia 09/16/2010  . EDEMA 01/08/2010  . PLEURAL EFFUSION 08/27/2009  . ENLARGEMENT OF LYMPH NODES 08/13/2009  . Hx of PE 05/24/2009  . OBESITY 12/08/2007  . HYPOTHYROIDISM, POSTABLATION 06/09/2006  . HYPERCHOLESTEROLEMIA 06/09/2006  . ANXIETY 06/09/2006  . PANIC DISORDER 06/09/2006  . HYPERTENSION, ESSENTIAL NOS 06/09/2006  . GLOMERULONEPHRITIS 06/09/2006  . Systemic lupus erythematosus 06/09/2006  . DVT, HX OF 06/09/2006   Past Medical History  Diagnosis Date  . Nonspecific abnormal results of liver function study   . Personal history of venous thrombosis and embolism 2002    during pregnancy; ?factor 5 leiden (sees heme)  . Edema   . 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 Nephritis and  nephropathy, not specified as acute or chronic, with unspecified pathological lesion in kidney   . Pure hypercholesterolemia   . Unspecified essential hypertension   . Other specified acquired hypothyroidism   . Obesity, unspecified   . Panic disorder without agoraphobia   . Unspecified pleural effusion   . Other pulmonary embolism and infarction   . 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 Pleural effusion 2005    c/w lupus initial w/u; recurrent Right as pred tapered off July 2011  . HLD (hyperlipidemia)   . Iron deficiency anemia     IV dextran [place]  . 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 GERD (gastroesophageal reflux disease)     on prilosec r/t gastric sleeve surgery  . Arthritis     Lupus - hands/knees  . Anxiety state, unspecified     panic attacks  . Sleep apnea    Past Surgical History  Procedure Laterality Date  . Pleuryx catheter placement  9/11     VanTright----removed 9/11  . Thoracentesis  2010    with penumonia  . Pleurocentesis  10/2004  . Gastric sleeve  8/12    bariatric surgery Dr Adolphus Birchwood   . Wisdom tooth extraction    . Svd      x 3  . Laparoscopic tubal ligation  12/09/2010    Procedure: LAPAROSCOPIC TUBAL LIGATION;  Surgeon: Juluis Mire;  Location: [facility];  Service: Gynecology;  Laterality: Bilateral;  Attempted see Nursing note  . Renal biopsy  09/24/2012   History  Substance Use Topics  . Smoking status: Former Smoker -- 0.50 packs/day for 10 years    Types: Cigarettes    Quit date: 05/04/2009  . Smokeless tobacco: Never Used     Comment: Socially x10 years (1 pack/month)  . Alcohol Use: Yes     Comment: socially   Family History  Problem Relation Age of Onset  . Lupus Sister   . Diabetes      GM   Allergies  Allergen Reactions  . Amoxicillin     REACTION: Pt. reports allergy, reaction not known  . Atorvastatin     REACTION: Reaction not known  . Lovenox [Enoxaparin Sodium]  Itching  . Moxifloxacin     REACTION: hallucinations   Current Outpatient Prescriptions on File Prior to Visit  Medication Sig Dispense Refill  . hydroxychloroquine (PLAQUENIL) 200 MG tablet Take by mouth  3 (three) times daily.      Marland Kitchen labetalol (NORMODYNE) 200 MG tablet Take 1 and 1/2 tablets  by mouth twice a day  90 tablet  11  . levothyroxine (SYNTHROID, LEVOTHROID) 175 MCG tablet Take 1 tablet (175 mcg total) by mouth daily before breakfast.  30 tablet  11  . mycophenolate (CELLCEPT) 500 MG tablet Take 1,000 mg by mouth 2 (two) times daily.       Marland Kitchen omeprazole (PRILOSEC) 20 MG capsule Take 20 mg by mouth daily.      Marland Kitchen PARoxetine (PAXIL) 40 MG tablet Take 1 tablet (40 mg total) by mouth every morning.  30 tablet  11  . prednisoLONE 5 MG TABS tablet Take 15 mg by mouth daily. Alternating 10 mg with 15 mg daily      . warfarin (COUMADIN) 7.5 MG tablet Take as directed by anticoagulation clinic  150 tablet  0   No current facility-administered medications on file prior to visit.        Review of Systems     Objective:   Physical Exam  Constitutional: She appears well-developed and well-nourished. No distress.  Morbidly obese (though improved) and well app  HENT:  Head: Normocephalic and atraumatic.  Right Ear: External ear normal.  Left Ear: External ear normal.  Nose: Nose normal.  Mouth/Throat: Oropharynx is clear and moist.  Eyes: Conjunctivae and EOM are normal. Pupils are equal, round, and reactive to light. Right eye exhibits no discharge. Left eye exhibits no discharge. No scleral icterus.  Neck: Normal range of motion. Neck supple. No JVD present. No thyromegaly present.  Cardiovascular: Normal rate, regular rhythm, normal heart sounds and intact distal pulses.  Exam reveals no gallop.   Pulmonary/Chest: Effort normal and breath sounds normal. No respiratory distress. She has no wheezes. She has no rales.  Abdominal: Soft. Bowel sounds are normal. She exhibits no  distension and no mass. There is no tenderness.  Musculoskeletal: She exhibits edema. She exhibits no tenderness.  Baseline trace to 1 plus edema with hyperpigmentation in lower legs   Lymphadenopathy:    She has no cervical adenopathy.  Neurological: She is alert. She has normal reflexes. No cranial nerve deficit. She exhibits normal muscle tone. Coordination normal.  Skin: Skin is warm and dry. No rash noted. No pallor.  Psychiatric: She has a normal mood and affect.          Assessment & Plan:

## 2012-12-07 NOTE — Patient Instructions (Addendum)
Flu vaccine today I will check with Dr Kellie Simmering about getting pneumonia and tetanus vacines  I will ask Dr Reece Agar about need for iron  Try to get 1200-1500 mg of calcium per day with at least 1000 iu of vitamin D - for bone health  We will refer you for a bone density test at check out  Start low dose generic lipitor for high cholesterol (atorvastatin)- if any problems or side effects let me know  Schedule fasting lab in 4-6 weeks

## 2012-12-07 NOTE — Telephone Encounter (Signed)
11/3 POF states return PRN shh

## 2012-12-08 NOTE — Assessment & Plan Note (Signed)
Followed by gyn

## 2012-12-08 NOTE — Assessment & Plan Note (Signed)
Hypothyroidism  Pt has no clinical changes No change in energy level/ hair or skin/ edema and no tremor Lab Results  Component Value Date   TSH 1.943 11/05/2012

## 2012-12-08 NOTE — Assessment & Plan Note (Signed)
Disc goals for lipids and reasons to control them Rev labs with pt Rev low sat fat diet in detail Diet and atorvastatin

## 2012-12-08 NOTE — Assessment & Plan Note (Signed)
Lab Results  Component Value Date   HGBA1C 5.7 06/03/2011   Continue to watch Enc further wt loss and low glycemic diet

## 2012-12-08 NOTE — Assessment & Plan Note (Signed)
Ref for dexa Disc imp of ca and D

## 2012-12-08 NOTE — Assessment & Plan Note (Signed)
bp in fair control at this time  No changes needed  Disc lifstyle change with low sodium diet and exercise   

## 2012-12-08 NOTE — Assessment & Plan Note (Signed)
Reviewed health habits including diet and exercise and skin cancer prevention Also reviewed health mt list, fam hx and immunizations  Wellness lab rev She with check with rheum about tdap and pneumovax Flu shot today

## 2012-12-17 ENCOUNTER — Ambulatory Visit (INDEPENDENT_AMBULATORY_CARE_PROVIDER_SITE_OTHER): Payer: Managed Care, Other (non HMO) | Admitting: General Practice

## 2012-12-17 DIAGNOSIS — Z7901 Long term (current) use of anticoagulants: Secondary | ICD-10-CM

## 2012-12-17 DIAGNOSIS — Z86718 Personal history of other venous thrombosis and embolism: Secondary | ICD-10-CM

## 2012-12-17 DIAGNOSIS — D6851 Activated protein C resistance: Secondary | ICD-10-CM

## 2012-12-17 DIAGNOSIS — Z5181 Encounter for therapeutic drug level monitoring: Secondary | ICD-10-CM

## 2012-12-17 DIAGNOSIS — I2699 Other pulmonary embolism without acute cor pulmonale: Secondary | ICD-10-CM

## 2012-12-17 DIAGNOSIS — Z7902 Long term (current) use of antithrombotics/antiplatelets: Secondary | ICD-10-CM

## 2012-12-17 DIAGNOSIS — D6859 Other primary thrombophilia: Secondary | ICD-10-CM

## 2012-12-17 NOTE — Progress Notes (Signed)
Pre-visit discussion using our clinic review tool. No additional management support is needed unless otherwise documented below in the visit note.  

## 2012-12-20 ENCOUNTER — Ambulatory Visit (INDEPENDENT_AMBULATORY_CARE_PROVIDER_SITE_OTHER): Payer: Managed Care, Other (non HMO) | Admitting: Critical Care Medicine

## 2012-12-20 ENCOUNTER — Encounter: Payer: Self-pay | Admitting: Critical Care Medicine

## 2012-12-20 VITALS — BP 122/78 | HR 79 | Ht 65.0 in | Wt 265.0 lb

## 2012-12-20 DIAGNOSIS — I619 Nontraumatic intracerebral hemorrhage, unspecified: Secondary | ICD-10-CM | POA: Insufficient documentation

## 2012-12-20 DIAGNOSIS — M329 Systemic lupus erythematosus, unspecified: Secondary | ICD-10-CM

## 2012-12-20 DIAGNOSIS — N182 Chronic kidney disease, stage 2 (mild): Secondary | ICD-10-CM

## 2012-12-20 DIAGNOSIS — N058 Unspecified nephritic syndrome with other morphologic changes: Secondary | ICD-10-CM

## 2012-12-20 DIAGNOSIS — M3214 Glomerular disease in systemic lupus erythematosus: Secondary | ICD-10-CM | POA: Insufficient documentation

## 2012-12-20 DIAGNOSIS — I2699 Other pulmonary embolism without acute cor pulmonale: Secondary | ICD-10-CM

## 2012-12-20 NOTE — Assessment & Plan Note (Signed)
History of pulmonary embolism and deep venous thrombosis with factor V Leiden gene mutation and associated lupus syndrome with chronic left pleural effusion Plan Maintain Coumadin for life Monitor pleural effusion for now as the patient is asymptomatic

## 2012-12-20 NOTE — Patient Instructions (Signed)
No change in medications. Return in        6 months        

## 2012-12-20 NOTE — Progress Notes (Signed)
Subjective:    Patient ID: [name], female    DOB: 1965-04-06, 47 y.o.   MRN: [id number]  HPI  47 y.o. wf last smoked around 06/2009 with chronic L empyema s/p VATS 2010.  History of lupus which was the cause of the pleural effusion is that became secondarily infected. History of venous thromboembolic disease with hypercoagulable state. History of intracranial hemorrhage on elevated INR.  12/20/2012 Chief Complaint  Patient presents with  . 6 month follow up    Breathing doing well overall.  No SOB, wheezing, chest tightness, chest pain, or cough at this time.  Wearing cpap every night for approx 6-7 hours.  No problems with mask or pressure.     Using cpap and likes it better, Mask fits better.  Pt now alt pred 15/10 and cellcept up to 1000 bid.  No dyspnea or chest pain. No bleeding on coumadin.  INR 1.8.  Now 11.25mg /d coumadin. [facility] clinic [facility].  Weight steady No change in dyspnea or worsening. Pt had renal bx and lupus positive on renals.   Past Medical History  Diagnosis Date  . Nonspecific abnormal results of liver function study   . Personal history of venous thrombosis and embolism 2002    during pregnancy; ?factor 5 leiden (sees heme)  . Edema   . 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 Nephritis and nephropathy, not specified as acute or chronic, with unspecified pathological lesion in kidney   . Pure hypercholesterolemia   . Unspecified essential hypertension   . Other specified acquired hypothyroidism   . Obesity, unspecified   . Panic disorder without agoraphobia   . Unspecified pleural effusion   . Other pulmonary embolism and infarction   . 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 Pleural effusion 2005    c/w lupus initial w/u; recurrent Right as pred tapered off July 2011  . HLD (hyperlipidemia)   . Iron deficiency anemia     IV dextran Coladonato  .  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 GERD (gastroesophageal reflux disease)     on prilosec r/t gastric sleeve surgery  . Arthritis     Lupus - hands/knees  . Anxiety state, unspecified     panic attacks  . Sleep apnea      Family History  Problem Relation Age of Onset  . Lupus Sister   . Diabetes      GM     History   Social History  . Marital Status: Divorced    Spouse Name: N/A    Number of Children: 3  . Years of Education: N/A   Occupational History  . Trainer at [employer]    Social History Main Topics  . Smoking status: Former Smoker -- 0.50 packs/day for 10 years    Types: Cigarettes    Quit date: 05/04/2009  . Smokeless tobacco: Never Used     Comment: Socially x10 years (1 pack/month)  . Alcohol Use: Yes     Comment: socially  . Drug Use: No  . Sexual Activity: Yes    Birth Control/ Protection: Condom   Other Topics Concern  . Not on file   Social History Narrative  . No narrative on file     Allergies  Allergen Reactions  . Amoxicillin     REACTION: Pt. reports allergy, reaction not  known  . Lisinopril     cough  . Lovenox [Enoxaparin Sodium] Itching  . Moxifloxacin     REACTION: hallucinations     Outpatient Prescriptions Prior to Visit  Medication Sig Dispense Refill  . atorvastatin (LIPITOR) 10 MG tablet Take 1 tablet (10 mg total) by mouth daily.  30 tablet  11  . hydroxychloroquine (PLAQUENIL) 200 MG tablet Take by mouth 3 (three) times daily.      Marland Kitchen labetalol (NORMODYNE) 200 MG tablet Take 1 and 1/2 tablets  by mouth twice a day  90 tablet  11  . levothyroxine (SYNTHROID, LEVOTHROID) 175 MCG tablet Take 1 tablet (175 mcg total) by mouth daily before breakfast.  30 tablet  11  . mycophenolate (CELLCEPT) 500 MG tablet Take 1,000 mg by mouth 2 (two) times daily.       Marland Kitchen omeprazole (PRILOSEC) 20 MG capsule Take 20 mg by mouth daily.      Marland Kitchen PARoxetine (PAXIL) 40 MG tablet Take 1 tablet (40 mg total) by mouth every morning.   30 tablet  11  . prednisoLONE 5 MG TABS tablet Take by mouth daily. Alternating 10 mg with 15 mg daily      . warfarin (COUMADIN) 7.5 MG tablet Take as directed by anticoagulation clinic  150 tablet  0   No facility-administered medications prior to visit.     Review of Systems  Constitutional:   No  weight loss, night sweats,  Fevers, chills, fatigue, lassitude. HEENT:   No headaches,  Difficulty swallowing,  Tooth/dental problems,  Sore throat,                No sneezing, itching, ear ache, nasal congestion, post nasal drip,   CV:  No chest pain,  Orthopnea, PND, swelling in lower extremities, anasarca, dizziness, palpitations  GI  No heartburn, indigestion, abdominal pain, nausea, vomiting, diarrhea, change in bowel habits, loss of appetite  Resp: +++ shortness of breath with exertion Not at rest.  No excess mucus, no productive cough,  No non-productive cough,  No coughing up of blood.  No change in color of mucus.  No wheezing.  No chest wall deformity  Skin: no rash or lesions.  GU: no dysuria, change in color of urine, no urgency or frequency.  No flank pain.  MS:  No joint pain or swelling.  No decreased range of motion.  No back pain.  Psych:  No change in mood or affect. No depression or anxiety.  No memory loss.     Objective:   Physical Exam   Filed Vitals:   12/20/12 1015  BP: 122/78  Pulse: 79  Height: 5\' 5"  (1.651 m)  Weight: 265 lb (120.203 kg)  SpO2: 98%    Gen: Pleasant, well-nourished, in no distress,  normal affect  ENT: No lesions,  mouth clear,  oropharynx clear, no postnasal drip  Neck: No JVD, no TMG, no carotid bruits  Lungs: No use of accessory muscles, no dullness to percussion, decreased BS 1/4 way up on L  Cardiovascular: RRR, heart sounds normal, no murmur or gallops, no peripheral edema  Abdomen: soft and NT, no HSM,  BS normal  Musculoskeletal: No deformities, no cyanosis or clubbing  Neuro: alert, non focal  Skin: Warm, no  lesions or rashes     Assessment & Plan:   Hx of PE History of pulmonary embolism and deep venous thrombosis with factor V Leiden gene mutation and associated lupus syndrome with chronic left pleural effusion Plan  Maintain Coumadin for life Monitor pleural effusion for now as the patient is asymptomatic    Updated Medication List Outpatient Encounter Prescriptions as of 12/20/2012  Medication Sig  . atorvastatin (LIPITOR) 10 MG tablet Take 1 tablet (10 mg total) by mouth daily.  Marland Kitchen BIOTIN PO Take 1 capsule by mouth daily.  . Cyanocobalamin (VITAMIN B-12 PO) Take 1 capsule by mouth daily.  . hydroxychloroquine (PLAQUENIL) 200 MG tablet Take by mouth 3 (three) times daily.  . IRON PO Take 1 tablet by mouth daily.  Marland Kitchen labetalol (NORMODYNE) 200 MG tablet Take 1 and 1/2 tablets  by mouth twice a day  . levothyroxine (SYNTHROID, LEVOTHROID) 175 MCG tablet Take 1 tablet (175 mcg total) by mouth daily before breakfast.  . Multiple Vitamin (MULTIVITAMIN) tablet Take 1 tablet by mouth daily.  . mycophenolate (CELLCEPT) 500 MG tablet Take 1,000 mg by mouth 2 (two) times daily.   Marland Kitchen omeprazole (PRILOSEC) 20 MG capsule Take 20 mg by mouth daily.  Marland Kitchen PARoxetine (PAXIL) 40 MG tablet Take 1 tablet (40 mg total) by mouth every morning.  . prednisoLONE 5 MG TABS tablet Take by mouth daily. Alternating 10 mg with 15 mg daily  . warfarin (COUMADIN) 7.5 MG tablet Take as directed by anticoagulation clinic

## 2012-12-21 ENCOUNTER — Inpatient Hospital Stay: Admission: RE | Admit: 2012-12-21 | Payer: Managed Care, Other (non HMO) | Source: Ambulatory Visit

## 2012-12-27 ENCOUNTER — Other Ambulatory Visit: Payer: Self-pay | Admitting: Family Medicine

## 2012-12-28 ENCOUNTER — Ambulatory Visit (INDEPENDENT_AMBULATORY_CARE_PROVIDER_SITE_OTHER): Payer: Managed Care, Other (non HMO) | Admitting: General Practice

## 2012-12-28 DIAGNOSIS — Z5181 Encounter for therapeutic drug level monitoring: Secondary | ICD-10-CM

## 2012-12-28 DIAGNOSIS — D6859 Other primary thrombophilia: Secondary | ICD-10-CM

## 2012-12-28 DIAGNOSIS — Z7902 Long term (current) use of antithrombotics/antiplatelets: Secondary | ICD-10-CM

## 2012-12-28 DIAGNOSIS — Z86718 Personal history of other venous thrombosis and embolism: Secondary | ICD-10-CM

## 2012-12-28 DIAGNOSIS — D6851 Activated protein C resistance: Secondary | ICD-10-CM

## 2012-12-28 DIAGNOSIS — Z7901 Long term (current) use of anticoagulants: Secondary | ICD-10-CM

## 2012-12-28 DIAGNOSIS — I2699 Other pulmonary embolism without acute cor pulmonale: Secondary | ICD-10-CM

## 2012-12-28 NOTE — Progress Notes (Signed)
Pre-visit discussion using our clinic review tool. No additional management support is needed unless otherwise documented below in the visit note.  

## 2013-01-11 ENCOUNTER — Encounter (HOSPITAL_COMMUNITY): Payer: Self-pay

## 2013-01-11 ENCOUNTER — Other Ambulatory Visit: Payer: Managed Care, Other (non HMO)

## 2013-01-14 ENCOUNTER — Other Ambulatory Visit: Payer: Managed Care, Other (non HMO)

## 2013-01-25 ENCOUNTER — Ambulatory Visit (INDEPENDENT_AMBULATORY_CARE_PROVIDER_SITE_OTHER): Payer: Managed Care, Other (non HMO) | Admitting: General Practice

## 2013-01-25 DIAGNOSIS — Z86718 Personal history of other venous thrombosis and embolism: Secondary | ICD-10-CM

## 2013-01-25 DIAGNOSIS — Z7902 Long term (current) use of antithrombotics/antiplatelets: Secondary | ICD-10-CM

## 2013-01-25 DIAGNOSIS — Z5181 Encounter for therapeutic drug level monitoring: Secondary | ICD-10-CM

## 2013-01-25 DIAGNOSIS — I2699 Other pulmonary embolism without acute cor pulmonale: Secondary | ICD-10-CM

## 2013-01-25 LAB — POCT INR: INR: 1.9

## 2013-01-25 NOTE — Progress Notes (Signed)
Pre-visit discussion using our clinic review tool. No additional management support is needed unless otherwise documented below in the visit note.  

## 2013-02-22 ENCOUNTER — Ambulatory Visit (INDEPENDENT_AMBULATORY_CARE_PROVIDER_SITE_OTHER): Payer: BC Managed Care – PPO | Admitting: General Practice

## 2013-02-22 DIAGNOSIS — Z7902 Long term (current) use of antithrombotics/antiplatelets: Secondary | ICD-10-CM

## 2013-02-22 DIAGNOSIS — Z5181 Encounter for therapeutic drug level monitoring: Secondary | ICD-10-CM

## 2013-02-22 DIAGNOSIS — Z86718 Personal history of other venous thrombosis and embolism: Secondary | ICD-10-CM

## 2013-02-22 DIAGNOSIS — I2699 Other pulmonary embolism without acute cor pulmonale: Secondary | ICD-10-CM

## 2013-02-22 LAB — POCT INR: INR: 2.3

## 2013-02-22 NOTE — Progress Notes (Signed)
Pre-visit discussion using our clinic review tool. No additional management support is needed unless otherwise documented below in the visit note.  

## 2013-03-08 ENCOUNTER — Ambulatory Visit (INDEPENDENT_AMBULATORY_CARE_PROVIDER_SITE_OTHER): Payer: BC Managed Care – PPO | Admitting: Family Medicine

## 2013-03-08 ENCOUNTER — Encounter: Payer: Self-pay | Admitting: Family Medicine

## 2013-03-08 ENCOUNTER — Ambulatory Visit: Payer: BC Managed Care – PPO | Admitting: Family Medicine

## 2013-03-08 VITALS — BP 110/72 | HR 87 | Temp 98.6°F | Ht 65.0 in | Wt 275.2 lb

## 2013-03-08 DIAGNOSIS — J019 Acute sinusitis, unspecified: Secondary | ICD-10-CM

## 2013-03-08 DIAGNOSIS — R0981 Nasal congestion: Secondary | ICD-10-CM

## 2013-03-08 DIAGNOSIS — B9689 Other specified bacterial agents as the cause of diseases classified elsewhere: Secondary | ICD-10-CM

## 2013-03-08 DIAGNOSIS — J3489 Other specified disorders of nose and nasal sinuses: Secondary | ICD-10-CM

## 2013-03-08 LAB — POCT INFLUENZA A/B
Influenza A, POC: NEGATIVE
Influenza B, POC: NEGATIVE

## 2013-03-08 MED ORDER — AZITHROMYCIN 250 MG PO TABS: ORAL_TABLET | ORAL | Status: DC

## 2013-03-08 NOTE — Progress Notes (Signed)
Subjective:    Patient ID: [name], female    DOB: 30-Aug-1965, 48 y.o.   MRN: [id number]  HPI Here with uri symptoms with fever   Congestion started 2 weeks ago - the crud was going around work  Progressed more and more  Exhausted by Saturday  Sunday and Monday- sweats  Facial pressure/ pain   Cough- prod of light yellow   She is on immunosupressant meds   Results for orders placed in visit on 03/08/13  POCT INFLUENZA A/B      Result Value Range   Influenza A, POC Negative     Influenza B, POC Negative        Patient Active Problem List   Diagnosis Date Noted  . CKD (chronic kidney disease) stage 2, GFR 60-89 ml/min 12/20/2012  . Chronic lupus nephritis 12/20/2012  . ICH (intracerebral hemorrhage) 12/20/2012  . Routine general medical examination at a health care facility 12/07/2012  . Steroid long-term use 12/07/2012  . History of HPV infection 12/07/2012  . Long term (current) use of anticoagulants 10/06/2012  . Chronic anticoagulation 04/22/2012  . S/P bariatric surgery 04/22/2012  . Factor V Leiden 04/22/2012  . Dark urine 04/16/2012  . Urine abnormality 04/16/2012  . Neck pain 02/23/2012  . OSA on CPAP 11/20/2011  . Hyperglycemia 09/16/2010  . EDEMA 01/08/2010  . PLEURAL EFFUSION 08/27/2009  . ENLARGEMENT OF LYMPH NODES 08/13/2009  . Hx of PE 05/24/2009  . OBESITY 12/08/2007  . HYPOTHYROIDISM, POSTABLATION 06/09/2006  . HYPERCHOLESTEROLEMIA 06/09/2006  . ANXIETY 06/09/2006  . PANIC DISORDER 06/09/2006  . HYPERTENSION, ESSENTIAL NOS 06/09/2006  . GLOMERULONEPHRITIS 06/09/2006  . Systemic lupus erythematosus 06/09/2006  . DVT, HX OF 06/09/2006   Past Medical History  Diagnosis Date  . Nonspecific abnormal results of liver function study   . Personal history of venous thrombosis and embolism 2002    during pregnancy; ?factor 5 leiden (sees heme)  . Edema   . 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     Loculated-chronic on Left-VanTright; s/p VATS  5/10  . Nephritis and nephropathy, not specified as acute or chronic, with unspecified pathological lesion in kidney   . Pure hypercholesterolemia   . Unspecified essential hypertension   . Other specified acquired hypothyroidism   . Obesity, unspecified   . Panic disorder without agoraphobia   . Unspecified pleural effusion   . Other pulmonary embolism and infarction   . 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 Pleural effusion 2005    c/w lupus initial w/u; recurrent Right as pred tapered off July 2011  . HLD (hyperlipidemia)   . Iron deficiency anemia     IV dextran Coladonato  . 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 GERD (gastroesophageal reflux disease)     on prilosec r/t gastric sleeve surgery  . Arthritis     Lupus - hands/knees  . Anxiety state, unspecified     panic attacks  . Sleep apnea    Past Surgical History  Procedure Laterality Date  . Pleuryx catheter placement  9/11     VanTright----removed 9/11  . Thoracentesis  2010    with penumonia  . Pleurocentesis  10/2004  . Gastric sleeve  8/12    bariatric surgery Dr Adolphus Birchwood   . Wisdom tooth extraction    . Svd      x 3  . Laparoscopic tubal ligation  12/09/2010    Procedure: LAPAROSCOPIC TUBAL LIGATION;  Surgeon: Juluis MireJohn S McComb;  Location: [facility];  Service: Gynecology;  Laterality: Bilateral;  Attempted see Nursing note  . Renal biopsy  09/24/2012   History  Substance Use Topics  . Smoking status: Former Smoker -- 0.50 packs/day for 10 years    Types: Cigarettes    Quit date: 05/04/2009  . Smokeless tobacco: Never Used     Comment: Socially x10 years (1 pack/month)  . Alcohol Use: Yes     Comment: socially   Family History  Problem Relation Age of Onset  . Lupus Sister   . Diabetes      GM   Allergies  Allergen Reactions  . Amoxicillin     REACTION: Pt. reports allergy, reaction not known  . Lisinopril     cough  . Lovenox [Enoxaparin Sodium]  Itching  . Moxifloxacin     REACTION: hallucinations   Current Outpatient Prescriptions on File Prior to Visit  Medication Sig Dispense Refill  . atorvastatin (LIPITOR) 10 MG tablet Take 1 tablet (10 mg total) by mouth daily.  30 tablet  11  . BIOTIN PO Take 1 capsule by mouth daily.      . Cyanocobalamin (VITAMIN B-12 PO) Take 1 capsule by mouth daily.      . hydroxychloroquine (PLAQUENIL) 200 MG tablet Take by mouth 3 (three) times daily.      . IRON PO Take 1 tablet by mouth daily.      Marland Kitchen. labetalol (NORMODYNE) 200 MG tablet Take 1 and 1/2 tablets  by mouth twice a day  90 tablet  11  . levothyroxine (SYNTHROID, LEVOTHROID) 175 MCG tablet TAKE 1 TABLET BY MOUTH DAILY  90 tablet  3  . Multiple Vitamin (MULTIVITAMIN) tablet Take 1 tablet by mouth daily.      . mycophenolate (CELLCEPT) 500 MG tablet Take 1,000 mg by mouth 2 (two) times daily.       Marland Kitchen. omeprazole (PRILOSEC) 20 MG capsule Take 20 mg by mouth daily.      Marland Kitchen. PARoxetine (PAXIL) 40 MG tablet Take 1 tablet (40 mg total) by mouth every morning.  30 tablet  11  . prednisoLONE 5 MG TABS tablet Take by mouth daily. Alternating 10 mg with 15 mg daily      . warfarin (COUMADIN) 7.5 MG tablet Take as directed by anticoagulation clinic  150 tablet  0   No current facility-administered medications on file prior to visit.      Review of Systems Review of Systems  Constitutional: pos for low grade fever and fatigue ENT pos for congestion/ facial pain/ purulent nasal dc and post nasal drip  Eyes: Negative for pain and visual disturbance.  Respiratory: Negative for wheeze  and shortness of breath.   Cardiovascular: Negative for cp or palpitations    Gastrointestinal: Negative for nausea, diarrhea and constipation.  Genitourinary: Negative for urgency and frequency.  Skin: Negative for pallor or rash   Neurological: Negative for weakness, light-headedness, numbness and headaches.  Hematological: Negative for adenopathy. Does not  bruise/bleed easily.  Psychiatric/Behavioral: Negative for dysphoric mood. The patient is not nervous/anxious.         Objective:   Physical Exam  Constitutional: She appears well-developed and well-nourished. No distress.  HENT:  Head: Normocephalic and atraumatic.  Left Ear: External ear normal.  Mouth/Throat: Oropharynx is clear and moist. No oropharyngeal exudate.  Nares are injected and congested  Maxillary sinus tenderness worse on L  R  TM is pink with effusion  Eyes: Conjunctivae and EOM are normal. Pupils are equal, round, and reactive to light. Right eye exhibits no discharge. Left eye exhibits no discharge.  Neck: Normal range of motion. Neck supple.  Cardiovascular: Normal rate and regular rhythm.   Pulmonary/Chest: Effort normal. No respiratory distress. She has wheezes. She has no rales. She exhibits no tenderness.  Very scant wheeze at bases bilat   Lymphadenopathy:    She has no cervical adenopathy.  Neurological: She is alert.  Skin: Skin is warm and dry. No rash noted.  Psychiatric: She has a normal mood and affect.          Assessment & Plan:

## 2013-03-08 NOTE — Progress Notes (Signed)
Pre-visit discussion using our clinic review tool. No additional management support is needed unless otherwise documented below in the visit note.  

## 2013-03-08 NOTE — Patient Instructions (Signed)
Continue mucinex Try some saline nasal spray or irrigation  Take the zithromax as directed  I think you have a sinus infection  Watch out for wheezing  Update if not starting to improve in a week or if worsening

## 2013-03-09 NOTE — Assessment & Plan Note (Signed)
Will cover with zpak (mult med intol and renal problems) Disc symptomatic care - see instructions on AVS  Update if not starting to improve in a week or if worsening

## 2013-03-15 ENCOUNTER — Other Ambulatory Visit: Payer: Self-pay | Admitting: *Deleted

## 2013-03-15 ENCOUNTER — Other Ambulatory Visit: Payer: Self-pay | Admitting: General Practice

## 2013-03-15 MED ORDER — WARFARIN SODIUM 7.5 MG PO TABS: ORAL_TABLET | ORAL | Status: DC

## 2013-03-15 NOTE — Telephone Encounter (Signed)
Fax refill request, [facility]

## 2013-03-25 ENCOUNTER — Ambulatory Visit (INDEPENDENT_AMBULATORY_CARE_PROVIDER_SITE_OTHER): Payer: BC Managed Care – PPO | Admitting: General Practice

## 2013-03-25 DIAGNOSIS — D6859 Other primary thrombophilia: Secondary | ICD-10-CM

## 2013-03-25 DIAGNOSIS — D6851 Activated protein C resistance: Secondary | ICD-10-CM

## 2013-03-25 DIAGNOSIS — Z5181 Encounter for therapeutic drug level monitoring: Secondary | ICD-10-CM | POA: Insufficient documentation

## 2013-03-25 DIAGNOSIS — I2699 Other pulmonary embolism without acute cor pulmonale: Secondary | ICD-10-CM

## 2013-03-25 DIAGNOSIS — Z86718 Personal history of other venous thrombosis and embolism: Secondary | ICD-10-CM

## 2013-03-25 DIAGNOSIS — Z7902 Long term (current) use of antithrombotics/antiplatelets: Secondary | ICD-10-CM

## 2013-03-25 LAB — POCT INR: INR: 1.8

## 2013-03-25 NOTE — Progress Notes (Signed)
Pre visit review using our clinic review tool, if applicable. No additional management support is needed unless otherwise documented below in the visit note. 

## 2013-04-04 ENCOUNTER — Encounter: Payer: Self-pay | Admitting: Oncology

## 2013-04-14 ENCOUNTER — Other Ambulatory Visit: Payer: Self-pay | Admitting: *Deleted

## 2013-04-14 MED ORDER — PAROXETINE HCL 40 MG PO TABS: 40.0000 mg | ORAL_TABLET | Freq: Every morning | ORAL | Status: DC

## 2013-04-14 MED ORDER — ATORVASTATIN CALCIUM 10 MG PO TABS: 10.0000 mg | ORAL_TABLET | Freq: Every day | ORAL | Status: DC

## 2013-04-14 NOTE — Telephone Encounter (Signed)
Ok to refill 

## 2013-04-15 ENCOUNTER — Other Ambulatory Visit: Payer: Self-pay | Admitting: General Practice

## 2013-04-15 ENCOUNTER — Ambulatory Visit (INDEPENDENT_AMBULATORY_CARE_PROVIDER_SITE_OTHER): Payer: BC Managed Care – PPO | Admitting: General Practice

## 2013-04-15 DIAGNOSIS — D6859 Other primary thrombophilia: Secondary | ICD-10-CM

## 2013-04-15 DIAGNOSIS — Z86718 Personal history of other venous thrombosis and embolism: Secondary | ICD-10-CM

## 2013-04-15 DIAGNOSIS — Z5181 Encounter for therapeutic drug level monitoring: Secondary | ICD-10-CM

## 2013-04-15 DIAGNOSIS — Z7902 Long term (current) use of antithrombotics/antiplatelets: Secondary | ICD-10-CM

## 2013-04-15 DIAGNOSIS — D6851 Activated protein C resistance: Secondary | ICD-10-CM

## 2013-04-15 DIAGNOSIS — I2699 Other pulmonary embolism without acute cor pulmonale: Secondary | ICD-10-CM

## 2013-04-15 LAB — POCT INR: INR: 2.1

## 2013-04-15 MED ORDER — WARFARIN SODIUM 7.5 MG PO TABS: ORAL_TABLET | ORAL | Status: DC

## 2013-04-15 NOTE — Progress Notes (Signed)
Pre visit review using our clinic review tool, if applicable. No additional management support is needed unless otherwise documented below in the visit note. 

## 2013-04-15 NOTE — Telephone Encounter (Signed)
[facility]  Sent to [facility], [facility].

## 2013-05-13 ENCOUNTER — Ambulatory Visit (INDEPENDENT_AMBULATORY_CARE_PROVIDER_SITE_OTHER): Payer: BC Managed Care – PPO | Admitting: General Practice

## 2013-05-13 DIAGNOSIS — Z7902 Long term (current) use of antithrombotics/antiplatelets: Secondary | ICD-10-CM

## 2013-05-13 DIAGNOSIS — Z5181 Encounter for therapeutic drug level monitoring: Secondary | ICD-10-CM

## 2013-05-13 DIAGNOSIS — I2699 Other pulmonary embolism without acute cor pulmonale: Secondary | ICD-10-CM

## 2013-05-13 DIAGNOSIS — Z86718 Personal history of other venous thrombosis and embolism: Secondary | ICD-10-CM

## 2013-05-13 LAB — POCT INR: INR: 3.4

## 2013-05-13 NOTE — Progress Notes (Signed)
Pre visit review using our clinic review tool, if applicable. No additional management support is needed unless otherwise documented below in the visit note. 

## 2013-06-21 ENCOUNTER — Telehealth: Payer: Self-pay

## 2013-06-21 ENCOUNTER — Telehealth: Payer: Self-pay | Admitting: General Practice

## 2013-06-21 NOTE — Telephone Encounter (Signed)
[facility] request [facility]in confirmation; transferred to Bailey Mechindy Boyd RN at [phone]. [facility] # given to [facility] if needed.

## 2013-06-21 NOTE — Telephone Encounter (Signed)
Spoke with Jonny RuizJohn Scientist, research (physical sciences)(Pharmacist) with [facility] to very order for Coumadin.

## 2013-06-22 ENCOUNTER — Ambulatory Visit (INDEPENDENT_AMBULATORY_CARE_PROVIDER_SITE_OTHER): Payer: BC Managed Care – PPO | Admitting: General Practice

## 2013-06-22 DIAGNOSIS — Z7902 Long term (current) use of antithrombotics/antiplatelets: Secondary | ICD-10-CM

## 2013-06-22 DIAGNOSIS — I2699 Other pulmonary embolism without acute cor pulmonale: Secondary | ICD-10-CM

## 2013-06-22 DIAGNOSIS — Z5181 Encounter for therapeutic drug level monitoring: Secondary | ICD-10-CM

## 2013-06-22 DIAGNOSIS — D6859 Other primary thrombophilia: Secondary | ICD-10-CM

## 2013-06-22 DIAGNOSIS — D6851 Activated protein C resistance: Secondary | ICD-10-CM

## 2013-06-22 DIAGNOSIS — Z86718 Personal history of other venous thrombosis and embolism: Secondary | ICD-10-CM

## 2013-06-22 LAB — POCT INR: INR: 2.7

## 2013-06-22 NOTE — Progress Notes (Signed)
Pre visit review using our clinic review tool, if applicable. No additional management support is needed unless otherwise documented below in the visit note. 

## 2013-09-09 ENCOUNTER — Other Ambulatory Visit: Payer: Self-pay | Admitting: Family Medicine

## 2013-09-09 NOTE — Telephone Encounter (Signed)
[place] to [place] since she is covering [facility] coumadin pt's

## 2013-09-13 ENCOUNTER — Ambulatory Visit (INDEPENDENT_AMBULATORY_CARE_PROVIDER_SITE_OTHER): Payer: BC Managed Care – PPO | Admitting: *Deleted

## 2013-09-13 DIAGNOSIS — Z86718 Personal history of other venous thrombosis and embolism: Secondary | ICD-10-CM

## 2013-09-13 DIAGNOSIS — Z7902 Long term (current) use of antithrombotics/antiplatelets: Secondary | ICD-10-CM

## 2013-09-13 DIAGNOSIS — Z5181 Encounter for therapeutic drug level monitoring: Secondary | ICD-10-CM

## 2013-09-13 DIAGNOSIS — I2699 Other pulmonary embolism without acute cor pulmonale: Secondary | ICD-10-CM

## 2013-09-13 DIAGNOSIS — D6851 Activated protein C resistance: Secondary | ICD-10-CM

## 2013-09-13 DIAGNOSIS — D6859 Other primary thrombophilia: Secondary | ICD-10-CM

## 2013-09-13 LAB — POCT INR: INR: 4.7

## 2013-10-25 ENCOUNTER — Ambulatory Visit (INDEPENDENT_AMBULATORY_CARE_PROVIDER_SITE_OTHER): Payer: BC Managed Care – PPO | Admitting: Family Medicine

## 2013-10-25 ENCOUNTER — Encounter: Payer: Self-pay | Admitting: Family Medicine

## 2013-10-25 ENCOUNTER — Ambulatory Visit (INDEPENDENT_AMBULATORY_CARE_PROVIDER_SITE_OTHER)
Admission: RE | Admit: 2013-10-25 | Discharge: 2013-10-25 | Disposition: A | Discharge status: Home / Self Care | Payer: BC Managed Care – PPO | Source: Ambulatory Visit | Attending: Family Medicine | Admitting: Family Medicine

## 2013-10-25 VITALS — BP 130/80 | HR 66 | Temp 98.5°F | Ht 65.0 in | Wt 296.8 lb

## 2013-10-25 DIAGNOSIS — Z7901 Long term (current) use of anticoagulants: Secondary | ICD-10-CM

## 2013-10-25 DIAGNOSIS — H5711 Ocular pain, right eye: Secondary | ICD-10-CM | POA: Insufficient documentation

## 2013-10-25 DIAGNOSIS — R7309 Other abnormal glucose: Secondary | ICD-10-CM

## 2013-10-25 DIAGNOSIS — R51 Headache: Secondary | ICD-10-CM

## 2013-10-25 DIAGNOSIS — D6859 Other primary thrombophilia: Secondary | ICD-10-CM

## 2013-10-25 DIAGNOSIS — D6851 Activated protein C resistance: Secondary | ICD-10-CM

## 2013-10-25 DIAGNOSIS — H5710 Ocular pain, unspecified eye: Secondary | ICD-10-CM

## 2013-10-25 DIAGNOSIS — R739 Hyperglycemia, unspecified: Secondary | ICD-10-CM

## 2013-10-25 LAB — PROTIME-INR
INR: 1.8 ratio — ABNORMAL HIGH (ref 0.8–1.0)
Prothrombin Time: 20 s — ABNORMAL HIGH (ref 9.6–13.1)

## 2013-10-25 LAB — SEDIMENTATION RATE: Sed Rate: 37 mm/hr — ABNORMAL HIGH (ref 0–22)

## 2013-10-25 LAB — C-REACTIVE PROTEIN

## 2013-10-25 NOTE — Progress Notes (Signed)
Pre visit review using our clinic review tool, if applicable. No additional management support is needed unless otherwise documented below in the visit note. 

## 2013-10-25 NOTE — Progress Notes (Signed)
Subjective:    Patient ID: [name], female    DOB: 11-Jun-1965, 48 y.o.   MRN: [id number]  HPI Here with a headache  Consistent for the past week  Was nauseated for a few days  This am her R eye is red and a bit tender to the touch  No trauma to head  Has moved - and been very busy-also work is very busy  Has occ had opth migraines - where vision was affected   A bit dizzy on and off - a bit off balance / not light headed    Not having aura but vision seemingly an aura  She is wearing glasses- did not put her contact in today   No cold symptoms  No allergy symptoms   Headache is bilateral- forehead and top of head  Not throbbing/- just constant and aggravatiging  Is taking tylenol  Is a coffee drinker -but no more than usual   She is still on coumadin   Patient Active Problem List   Diagnosis Date Noted  . Encounter for therapeutic drug monitoring 03/25/2013  . Acute bacterial sinusitis 03/08/2013  . CKD (chronic kidney disease) stage 2, GFR 60-89 ml/min 12/20/2012  . Chronic lupus nephritis 12/20/2012  . ICH (intracerebral hemorrhage) 12/20/2012  . Routine general medical examination at a health care facility 12/07/2012  . Steroid long-term use 12/07/2012  . History of HPV infection 12/07/2012  . Long term (current) use of anticoagulants 10/06/2012  . Chronic anticoagulation 04/22/2012  . S/P bariatric surgery 04/22/2012  . Factor V Leiden 04/22/2012  . Dark urine 04/16/2012  . Urine abnormality 04/16/2012  . Neck pain 02/23/2012  . OSA on CPAP 11/20/2011  . Hyperglycemia 09/16/2010  . EDEMA 01/08/2010  . PLEURAL EFFUSION 08/27/2009  . ENLARGEMENT OF LYMPH NODES 08/13/2009  . Hx of PE 05/24/2009  . OBESITY 12/08/2007  . HYPOTHYROIDISM, POSTABLATION 06/09/2006  . HYPERCHOLESTEROLEMIA 06/09/2006  . ANXIETY 06/09/2006  . PANIC DISORDER 06/09/2006  . HYPERTENSION, ESSENTIAL NOS 06/09/2006  . GLOMERULONEPHRITIS 06/09/2006  . Systemic lupus erythematosus  06/09/2006  . DVT, HX OF 06/09/2006   Past Medical History  Diagnosis Date  . Nonspecific abnormal results of liver function study   . Personal history of venous thrombosis and embolism 2002    during pregnancy; ?factor 5 leiden (sees heme)  . Edema   . 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 Nephritis and nephropathy, not specified as acute or chronic, with unspecified pathological lesion in kidney   . Pure hypercholesterolemia   . Unspecified essential hypertension   . Other specified acquired hypothyroidism   . Obesity, unspecified   . Panic disorder without agoraphobia   . Unspecified pleural effusion   . Other pulmonary embolism and infarction   . 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 Pleural effusion 2005    c/w lupus initial w/u; recurrent Right as pred tapered off July 2011  . HLD (hyperlipidemia)   . Iron deficiency anemia     IV dextran [place]  . 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 GERD (gastroesophageal reflux disease)     on prilosec r/t gastric sleeve surgery  . Arthritis     Lupus - hands/knees  . Anxiety state, unspecified     panic attacks  . Sleep apnea    Past Surgical History  Procedure Laterality Date  . Pleuryx catheter placement  9/11     VanTright----removed 9/11  . Thoracentesis  2010    with penumonia  . Pleurocentesis  10/2004  . Gastric sleeve  8/12    bariatric surgery Dr Evorn Gong   . Wisdom tooth extraction    . Svd      x 3  . Laparoscopic tubal ligation  12/09/2010    Procedure: LAPAROSCOPIC TUBAL LIGATION;  Surgeon: Darlyn Chamber;  Location: [facility];  Service: Gynecology;  Laterality: Bilateral;  Attempted see Nursing note  . Renal biopsy  09/24/2012   History  Substance Use Topics  . Smoking status: Former Smoker -- 0.50 packs/day for 10 years    Types: Cigarettes    Quit date: 05/04/2009  . Smokeless tobacco: Never Used       Comment: Socially x10 years (1 pack/month)  . Alcohol Use: Yes     Comment: socially   Family History  Problem Relation Age of Onset  . Lupus Sister   . Diabetes      GM   Allergies  Allergen Reactions  . Amoxicillin     REACTION: Pt. reports allergy, reaction not known  . Lisinopril     cough  . Lovenox [Enoxaparin Sodium] Itching  . Moxifloxacin     REACTION: hallucinations   Current Outpatient Prescriptions on File Prior to Visit  Medication Sig Dispense Refill  . atorvastatin (LIPITOR) 10 MG tablet Take 1 tablet (10 mg total) by mouth daily.  90 tablet  2  . BIOTIN PO Take 1 capsule by mouth daily.      . Cyanocobalamin (VITAMIN B-12 PO) Take 1 capsule by mouth daily.      . hydroxychloroquine (PLAQUENIL) 200 MG tablet Take 200 mg by mouth 2 (two) times daily.       . IRON PO Take 1 tablet by mouth daily.      Marland Kitchen labetalol (NORMODYNE) 200 MG tablet Take 1 and 1/2 tablets  by mouth twice a day  90 tablet  11  . levothyroxine (SYNTHROID, LEVOTHROID) 175 MCG tablet TAKE 1 TABLET BY MOUTH DAILY  90 tablet  3  . Multiple Vitamin (MULTIVITAMIN) tablet Take 1 tablet by mouth daily.      . mycophenolate (CELLCEPT) 500 MG tablet Take 1,000 mg by mouth 2 (two) times daily.       Marland Kitchen omeprazole (PRILOSEC) 20 MG capsule Take 20 mg by mouth daily.      Marland Kitchen PARoxetine (PAXIL) 40 MG tablet Take 1 tablet (40 mg total) by mouth every morning.  90 tablet  2  . prednisoLONE 5 MG TABS tablet Take 5 mg by mouth daily.       Marland Kitchen warfarin (COUMADIN) 7.5 MG tablet Take as directed by anticoagulation clinic  165 tablet  1   No current facility-administered medications on file prior to visit.    Hx of intracerebral hemorrhage in 4/14 - this worries her    Review of Systems Review of Systems  Constitutional: Negative for fever, appetite change,  and unexpected weight change.  ENt pos for post nasal drip Gwenyth Bouillon /neg for st or rhinorrhea  Eyes: Negative for  visual disturbance. pos for  irritation of R eye with injection  Respiratory: Negative for cough and shortness of breath.   Cardiovascular: Negative for cp or palpitations    Gastrointestinal: Negative for nausea, diarrhea and constipation.  Genitourinary: Negative for urgency and frequency.  Skin: Negative for pallor or rash  Neurological: Negative for weakness, numbness and pos for headaches.  Hematological: Negative for adenopathy. Does not bruise/bleed easily.  Psychiatric/Behavioral: Negative for dysphoric mood. The patient is not nervous/anxious.         Objective:   Physical Exam  Constitutional: She is oriented to person, place, and time. She appears well-developed and well-nourished.  obese and well appearing   HENT:  Head: Normocephalic and atraumatic.  Right Ear: External ear normal.  Left Ear: External ear normal.  Mouth/Throat: Oropharynx is clear and moist. No oropharyngeal exudate.  Nares are boggy with scant clear rhinorrhea   No sinus tenderness No temporal tenderness   Eyes: EOM are normal. Pupils are equal, round, and reactive to light. Right eye exhibits no discharge. Left eye exhibits no discharge. No scleral icterus.  Mild injection and watering of R eye  No lid swelling or redness   Neck: Normal range of motion. Neck supple.  Cardiovascular: Normal rate, regular rhythm and intact distal pulses.  Exam reveals no gallop.   Pulmonary/Chest: Effort normal and breath sounds normal. No respiratory distress. She has no wheezes. She has no rales.  Abdominal: Soft. Bowel sounds are normal.  Musculoskeletal: She exhibits no edema.  Lymphadenopathy:    She has no cervical adenopathy.  Neurological: She is alert and oriented to person, place, and time. She has normal reflexes. She displays no atrophy and no tremor. No cranial nerve deficit or sensory deficit. She exhibits normal muscle tone. She displays a negative Romberg sign. She displays no seizure activity. Coordination and gait normal.   Skin: Skin is warm and dry. No rash noted. No erythema. No pallor.  Psychiatric: She has a normal mood and affect.          Assessment & Plan:   Problem List Items Addressed This Visit     Hematopoietic and Hemostatic   Factor V Leiden     Other   Hyperglycemia   Chronic anticoagulation     INR today with lab    Relevant Orders      Protime-INR (Completed)   Headache(784.0) - Primary     This has some migraine features  No vision change but also some symptoms of conjunctivitis ? Coming down with uri  Past hx of ICH - will check CT scan (pt is anticoagulated) Also ESR / CRP for poss temporal arteritis (expect these to be somewhat elevated baseline however due to her lupus) Disc red flags to watch for  If sudden worsening or neuro change will go to ER    Relevant Orders      Sedimentation Rate (Completed)      C-reactive protein (Completed)      CT Head Wo Contrast (Completed)   Pain, eye, right     Poss rel to ha but also features of viral conjunctivitis  Disc sympt care for this  W/u for ha as stated above     Relevant Orders      Sedimentation Rate (Completed)      C-reactive protein (Completed)      CT Head Wo Contrast (Completed)

## 2013-10-25 NOTE — Patient Instructions (Signed)
Drink lots of water  Cut back on caffeine  Use artificial tears in eye , if it gets crusty- wipe it with a warm cloth  Keep me posted if this worsens  No contacts until this is better  Stop at check out for CT referral  Lab today   Will update you

## 2013-10-26 ENCOUNTER — Telehealth: Payer: Self-pay | Admitting: *Deleted

## 2013-10-26 NOTE — Telephone Encounter (Signed)
Pt called and VM left in regards to INR levels that was routed to me. Pt advised to call back to discuss dosing and a coumadin clinic appointment

## 2013-10-27 ENCOUNTER — Ambulatory Visit (INDEPENDENT_AMBULATORY_CARE_PROVIDER_SITE_OTHER): Payer: BC Managed Care – PPO | Admitting: *Deleted

## 2013-10-27 DIAGNOSIS — Z5181 Encounter for therapeutic drug level monitoring: Secondary | ICD-10-CM

## 2013-10-27 DIAGNOSIS — I2699 Other pulmonary embolism without acute cor pulmonale: Secondary | ICD-10-CM

## 2013-10-27 DIAGNOSIS — D6851 Activated protein C resistance: Secondary | ICD-10-CM

## 2013-10-27 DIAGNOSIS — D6859 Other primary thrombophilia: Secondary | ICD-10-CM

## 2013-10-27 LAB — POCT INR: INR: 1.8

## 2013-10-27 NOTE — Assessment & Plan Note (Signed)
Poss rel to ha but also features of viral conjunctivitis  Disc sympt care for this  W/u for ha as stated above

## 2013-10-27 NOTE — Assessment & Plan Note (Signed)
This has some migraine features  No vision change but also some symptoms of conjunctivitis ? Coming down with uri  Past hx of ICH - will check CT scan (pt is anticoagulated) Also ESR / CRP for poss temporal arteritis (expect these to be somewhat elevated baseline however due to her lupus) Disc red flags to watch for  If sudden worsening or neuro change will go to ER

## 2013-10-27 NOTE — Assessment & Plan Note (Signed)
INR today with lab

## 2013-10-30 ENCOUNTER — Other Ambulatory Visit: Payer: Self-pay | Admitting: Family Medicine

## 2013-10-31 ENCOUNTER — Other Ambulatory Visit: Payer: Self-pay | Admitting: Family Medicine

## 2013-11-02 ENCOUNTER — Ambulatory Visit (INDEPENDENT_AMBULATORY_CARE_PROVIDER_SITE_OTHER): Payer: BC Managed Care – PPO

## 2013-11-02 VITALS — BP 131/74 | HR 85 | Resp 14 | Ht 65.0 in | Wt 291.0 lb

## 2013-11-02 DIAGNOSIS — M722 Plantar fascial fibromatosis: Secondary | ICD-10-CM

## 2013-11-02 DIAGNOSIS — M79609 Pain in unspecified limb: Secondary | ICD-10-CM

## 2013-11-02 DIAGNOSIS — M79671 Pain in right foot: Secondary | ICD-10-CM

## 2013-11-02 NOTE — Patient Instructions (Signed)
Plantar Fasciitis Plantar fasciitis is a common condition that causes foot pain. It is soreness (inflammation) of the band of tough fibrous tissue on the bottom of the foot that runs from the heel bone (calcaneus) to the ball of the foot. The cause of this soreness may be from excessive standing, poor fitting shoes, running on hard surfaces, being overweight, having an abnormal walk, or overuse (this is common in runners) of the painful foot or feet. It is also common in aerobic exercise dancers and ballet dancers. SYMPTOMS  Most people with plantar fasciitis complain of:  Severe pain in the morning on the bottom of their foot especially when taking the first steps out of bed. This pain recedes after a few minutes of walking.  Severe pain is experienced also during walking following a long period of inactivity.  Pain is worse when walking barefoot or up stairs DIAGNOSIS   Your caregiver will diagnose this condition by examining and feeling your foot.  Special tests such as X-rays of your foot, are usually not needed. PREVENTION   Consult a sports medicine professional before beginning a new exercise program.  Walking programs offer a good workout. With walking there is a lower chance of overuse injuries common to runners. There is less impact and less jarring of the joints.  Begin all new exercise programs slowly. If problems or pain develop, decrease the amount of time or distance until you are at a comfortable level.  Wear good shoes and replace them regularly.  Stretch your foot and the heel cords at the back of the ankle (Achilles tendon) both before and after exercise.  Run or exercise on even surfaces that are not hard. For example, asphalt is better than pavement.  Do not run barefoot on hard surfaces.  If using a treadmill, vary the incline.  Do not continue to workout if you have foot or joint problems. Seek professional help if they do not improve. HOME CARE INSTRUCTIONS     Avoid activities that cause you pain until you recover.  Use ice or cold packs on the problem or painful areas after working out.  Only take over-the-counter or prescription medicines for pain, discomfort, or fever as directed by your caregiver.  Soft shoe inserts or athletic shoes with air or gel sole cushions may be helpful.  If problems continue or become more severe, consult a sports medicine caregiver or your own health care provider. Cortisone is a potent anti-inflammatory medication that may be injected into the painful area. You can discuss this treatment with your caregiver. MAKE SURE YOU:   Understand these instructions.  Will watch your condition.  Will get help right away if you are not doing well or get worse. Document Released: 10/15/2000 Document Revised: 04/14/2011 Document Reviewed: 12/15/2007 ExitCare Patient Information 2015 [employer]. This information is not intended to replace advice given to you by your health care provider. Make sure you discuss any questions you have with your health care provider.    ICE INSTRUCTIONS  Apply ice or cold pack to the affected area at least 3 times a day for 10-15 minutes each time.  You should also use ice after prolonged activity or vigorous exercise.  Do not apply ice longer than 20 minutes at one time.  Always keep a cloth between your skin and the ice pack to prevent burns.  Being consistent and following these instructions will help control your symptoms.  We suggest you purchase a gel ice pack because they are   reusable and do bit leak.  Some of them are designed to wrap around the area.  Use the method that works best for you.  Here are some other suggestions for icing.   Use a frozen bag of peas or corn-inexpensive and molds well to your body, usually stays frozen for 10 to 20 minutes.  Wet a towel with cold water and squeeze out the excess until it's damp.  Place in a bag in the freezer for 20 minutes. Then remove  and use. 

## 2013-11-02 NOTE — Progress Notes (Signed)
   Subjective:    Patient ID: [name], female    DOB: 04/05/1965, 48 y.o.   MRN: [id number]  HPI Comments: N heel pain L right heel and inferior arch D 4 months O began after multiple spontaneous episodes of dorsal foot cramping, after [place] [employer] [place] [employer] C feels like there is something in the heel, varying degree of pain A worse in the morning and rest, and after long periods of standing T heel raising stretches, Dansko shoes, and Allegra shoes     Review of Systems  Constitutional: Positive for fever and unexpected weight change.  Eyes: Positive for redness.  Genitourinary: Positive for hematuria.  Neurological: Positive for tremors.  All other systems reviewed and are negative.      Objective:   Physical Exam 48 year old female well-developed well-nourished oriented x3 presents this time with a several month history of pain inferior right heel Magan plantar fascia from mid arch to heal medial calcaneal tubercle area. No history of injury or trauma to the patient cases aggravated with activities has tried stretching and ice. Currently wearing a pair flimsy type flats with no support around the house he does have danco and Birkenstock type shoes. Neurologically skin color pigment normal hair growth absent nails unremarkable orthopedic biomechanical exam rectus foot type some of the higher foot from appropriate changes noted on weightbearing x-rays reveal well-developed thickening of the fascia mild inferior You spurring no fracture no osseous abnormal on exam there is no open wounds or ulcers no secondary infection is noted       Assessment & Plan:  Assessment plantar fasciitis/heel spur syndrome right foot this time fascial strapping applied use Tylenol as he for pain tramadol for any breakthrough pain have to avoid NSAIDs due to the Coumadin therapy. Reappointed 2 weeks for followup indicated for orthoses based on progress and improvement recommend stable shoes  around the house no barefoot no flip-flops also recommended ice to the heel areas every evening  Alvan Dameichard Elier Zellars DPM

## 2013-11-04 DIAGNOSIS — Z13820 Encounter for screening for osteoporosis: Secondary | ICD-10-CM | POA: Insufficient documentation

## 2013-11-04 NOTE — Addendum Note (Signed)
Addended by: Roxy MannsWER, Kennedee Kitzmiller A on: 11/04/2013 09:46 AM   Modules accepted: Orders

## 2013-11-07 ENCOUNTER — Ambulatory Visit: Payer: BC Managed Care – PPO

## 2013-11-07 ENCOUNTER — Other Ambulatory Visit: Payer: Self-pay

## 2013-11-07 MED ORDER — WARFARIN SODIUM 7.5 MG PO TABS: ORAL_TABLET | ORAL | Status: DC

## 2013-11-07 NOTE — Telephone Encounter (Signed)
Will refill electronically  

## 2013-11-07 NOTE — Telephone Encounter (Signed)
Pt left v/m requesting refill warfarin to [facility] [address].Please advise.pt request cb when refilled.

## 2013-11-17 ENCOUNTER — Other Ambulatory Visit: Payer: Self-pay | Admitting: *Deleted

## 2013-11-17 MED ORDER — LABETALOL HCL 200 MG PO TABS: ORAL_TABLET | ORAL | Status: DC

## 2013-11-18 ENCOUNTER — Ambulatory Visit (INDEPENDENT_AMBULATORY_CARE_PROVIDER_SITE_OTHER)
Admission: RE | Admit: 2013-11-18 | Discharge: 2013-11-18 | Disposition: A | Discharge status: Home / Self Care | Payer: BC Managed Care – PPO | Source: Ambulatory Visit | Attending: Family Medicine | Admitting: Family Medicine

## 2013-11-18 DIAGNOSIS — Z13820 Encounter for screening for osteoporosis: Secondary | ICD-10-CM

## 2013-11-18 LAB — HM DEXA SCAN: HM Dexa Scan: NORMAL

## 2013-11-22 ENCOUNTER — Ambulatory Visit (INDEPENDENT_AMBULATORY_CARE_PROVIDER_SITE_OTHER): Payer: BC Managed Care – PPO

## 2013-11-22 DIAGNOSIS — M722 Plantar fascial fibromatosis: Secondary | ICD-10-CM

## 2013-11-22 DIAGNOSIS — M79671 Pain in right foot: Secondary | ICD-10-CM

## 2013-11-22 NOTE — Patient Instructions (Signed)

## 2013-11-22 NOTE — Progress Notes (Signed)
   Subjective:    Patient ID: [name], female    DOB: 12/16/1965, 48 y.o.   MRN: [id number]  HPI Comments: Pt states the right heel feels better than it did, but she slipped on something and felt a pull on the arch.     Review of Systems no new findings or systemic changes noted     Objective:   Physical Exam Neurovascular status intact pedal pulses are palpable epicritic and proprioceptive sensations intact patient significant improvement with a fascial strapping and placed therefore would benefit from functional orthoses orthotic coverage was verified an 80% and orthotics skin carried out at this time. Fascial strapping reapplied to maintain from waist to 5 days until orthotics can be followed up with maintain NSAID therapy in the interim as well maintain a stable shoe at all times       Assessment & Plan:  Assessment plantar fasciitis/heel spur syndrome response of fascial strapping should respond to functional orthoses functional orthotic as ordered at this time extrinsic rear foot post 3 also retained type orthotic patient will be contacted when orthotics rate for fitting with the next 3 or 4 weeks for followup fascial strapping reapplied at this time per patient request

## 2013-11-23 ENCOUNTER — Encounter: Payer: Self-pay | Admitting: Family Medicine

## 2013-12-05 ENCOUNTER — Other Ambulatory Visit (HOSPITAL_COMMUNITY): Payer: Self-pay | Admitting: *Deleted

## 2013-12-06 ENCOUNTER — Ambulatory Visit (HOSPITAL_COMMUNITY)
Admission: RE | Admit: 2013-12-06 | Discharge: 2013-12-06 | Disposition: A | Discharge status: Home / Self Care | Payer: BC Managed Care – PPO | Source: Ambulatory Visit | Attending: Nephrology | Admitting: Nephrology

## 2013-12-06 DIAGNOSIS — Z7901 Long term (current) use of anticoagulants: Secondary | ICD-10-CM | POA: Diagnosis not present

## 2013-12-06 DIAGNOSIS — R801 Persistent proteinuria, unspecified: Secondary | ICD-10-CM | POA: Diagnosis not present

## 2013-12-06 DIAGNOSIS — N182 Chronic kidney disease, stage 2 (mild): Secondary | ICD-10-CM | POA: Diagnosis not present

## 2013-12-06 DIAGNOSIS — N2581 Secondary hyperparathyroidism of renal origin: Secondary | ICD-10-CM | POA: Insufficient documentation

## 2013-12-06 DIAGNOSIS — I10 Essential (primary) hypertension: Secondary | ICD-10-CM | POA: Diagnosis not present

## 2013-12-06 DIAGNOSIS — M3214 Glomerular disease in systemic lupus erythematosus: Secondary | ICD-10-CM | POA: Diagnosis present

## 2013-12-06 MED ORDER — FERUMOXYTOL INJECTION 510 MG/17 ML
1020.0000 mg | Freq: Once | INTRAVENOUS | Status: AC
  Administered 2013-12-06: 1020 mg via INTRAVENOUS
  Filled 2013-12-06: qty 34

## 2013-12-16 ENCOUNTER — Other Ambulatory Visit: Payer: BC Managed Care – PPO

## 2013-12-22 ENCOUNTER — Other Ambulatory Visit (INDEPENDENT_AMBULATORY_CARE_PROVIDER_SITE_OTHER): Payer: BC Managed Care – PPO

## 2013-12-22 DIAGNOSIS — I2699 Other pulmonary embolism without acute cor pulmonale: Secondary | ICD-10-CM

## 2013-12-22 DIAGNOSIS — D6851 Activated protein C resistance: Secondary | ICD-10-CM

## 2013-12-22 DIAGNOSIS — Z86718 Personal history of other venous thrombosis and embolism: Secondary | ICD-10-CM

## 2013-12-22 DIAGNOSIS — T81718A Complication of other artery following a procedure, not elsewhere classified, initial encounter: Principal | ICD-10-CM

## 2013-12-23 LAB — PROTIME-INR
INR: 2.14 — ABNORMAL HIGH (ref <1.50)
PROTHROMBIN TIME: 23.9 s — AB (ref 11.6–15.2)

## 2014-01-09 ENCOUNTER — Other Ambulatory Visit (HOSPITAL_COMMUNITY): Payer: BC Managed Care – PPO

## 2014-01-11 ENCOUNTER — Other Ambulatory Visit (HOSPITAL_COMMUNITY): Payer: Self-pay | Admitting: Obstetrics and Gynecology

## 2014-01-11 ENCOUNTER — Ambulatory Visit (HOSPITAL_COMMUNITY): Payer: BC Managed Care – PPO | Attending: Cardiovascular Disease | Admitting: Radiology

## 2014-01-11 DIAGNOSIS — R011 Cardiac murmur, unspecified: Secondary | ICD-10-CM

## 2014-01-11 NOTE — Progress Notes (Signed)
Echocardiogram performed.  

## 2014-01-16 ENCOUNTER — Other Ambulatory Visit: Payer: Self-pay | Admitting: *Deleted

## 2014-01-16 MED ORDER — LABETALOL HCL 200 MG PO TABS: ORAL_TABLET | ORAL | Status: DC

## 2014-01-16 NOTE — Telephone Encounter (Signed)
Fax requesting Rx sent to mail order pharmacy

## 2014-01-23 ENCOUNTER — Other Ambulatory Visit: Payer: Self-pay | Admitting: Family Medicine

## 2014-01-23 ENCOUNTER — Ambulatory Visit (INDEPENDENT_AMBULATORY_CARE_PROVIDER_SITE_OTHER): Payer: BC Managed Care – PPO | Admitting: Family Medicine

## 2014-01-23 DIAGNOSIS — Z5181 Encounter for therapeutic drug level monitoring: Secondary | ICD-10-CM

## 2014-01-23 DIAGNOSIS — D6851 Activated protein C resistance: Secondary | ICD-10-CM

## 2014-01-23 LAB — POCT INR: INR: 2.9

## 2014-01-23 NOTE — Telephone Encounter (Signed)
Rout to [name] 

## 2014-01-24 ENCOUNTER — Ambulatory Visit: Payer: BC Managed Care – PPO

## 2014-01-24 DIAGNOSIS — M722 Plantar fascial fibromatosis: Secondary | ICD-10-CM

## 2014-01-24 NOTE — Patient Instructions (Signed)

## 2014-01-24 NOTE — Progress Notes (Signed)
Pt is here to [facility]O 

## 2014-01-26 ENCOUNTER — Other Ambulatory Visit: Payer: Self-pay

## 2014-01-26 ENCOUNTER — Other Ambulatory Visit: Payer: Self-pay | Admitting: Family Medicine

## 2014-01-26 MED ORDER — LEVOTHYROXINE SODIUM 175 MCG PO TABS: 175.0000 ug | ORAL_TABLET | Freq: Every day | ORAL | Status: DC

## 2014-01-26 NOTE — Telephone Encounter (Signed)
done

## 2014-01-26 NOTE — Telephone Encounter (Signed)
Electronic refill request, please advise  

## 2014-01-26 NOTE — Telephone Encounter (Signed)
Pt traveling today and request #14 of levothyroxine [facility] to [facility] [place]. Pt has cpx scheduled for 02/15/2014. Advised pt done.

## 2014-01-26 NOTE — Telephone Encounter (Signed)
Please refill for a year  

## 2014-02-07 ENCOUNTER — Telehealth: Payer: Self-pay | Admitting: Family Medicine

## 2014-02-07 DIAGNOSIS — Z0000 Encounter for general adult medical examination without abnormal findings: Secondary | ICD-10-CM

## 2014-02-07 DIAGNOSIS — R739 Hyperglycemia, unspecified: Secondary | ICD-10-CM

## 2014-02-07 DIAGNOSIS — E7800 Pure hypercholesterolemia, unspecified: Secondary | ICD-10-CM

## 2014-02-07 NOTE — Telephone Encounter (Signed)
-----   Message from Alvina Chouerri J Walsh sent at 01/31/2014  2:13 PM EST ----- Regarding: Lab orders for Wednesday, 1.6.15 Patient is scheduled for CPX labs, please order future labs, Thanks , Camelia Engerri

## 2014-02-08 ENCOUNTER — Other Ambulatory Visit: Payer: BC Managed Care – PPO

## 2014-02-13 ENCOUNTER — Other Ambulatory Visit: Payer: Self-pay | Admitting: *Deleted

## 2014-02-13 MED ORDER — LEVOTHYROXINE SODIUM 175 MCG PO TABS: 175.0000 ug | ORAL_TABLET | Freq: Every day | ORAL | Status: DC

## 2014-02-13 NOTE — Telephone Encounter (Signed)
Received faxed refill request from pharmacy. Refill sent to pharmacy electronically. 

## 2014-02-15 ENCOUNTER — Telehealth: Payer: Self-pay | Admitting: Family Medicine

## 2014-02-15 ENCOUNTER — Encounter: Payer: BLUE CROSS/BLUE SHIELD | Admitting: Family Medicine

## 2014-02-15 MED ORDER — ATORVASTATIN CALCIUM 10 MG PO TABS: 10.0000 mg | ORAL_TABLET | Freq: Every day | ORAL | Status: DC

## 2014-02-15 MED ORDER — LEVOTHYROXINE SODIUM 175 MCG PO TABS: 175.0000 ug | ORAL_TABLET | Freq: Every day | ORAL | Status: DC

## 2014-02-15 NOTE — Telephone Encounter (Signed)
No problem- I sent electronically

## 2014-02-15 NOTE — Telephone Encounter (Signed)
Pt rescheduled her appointment due to late arrival, however she is completely out of  atorvastatin (LIPITOR) 10 MG tablet [[id number] and  levothyroxine (SYNTHROID, LEVOTHROID) 175 MCG tablet   Pt would like these refilled for 30 days to [facility] [address] in [place].  She will then revert back to the 90 day mail order.  Pt rescheduled her CPE to 02/21/14 12 noon.

## 2014-02-20 ENCOUNTER — Encounter: Payer: Self-pay | Admitting: Family Medicine

## 2014-02-20 ENCOUNTER — Other Ambulatory Visit (INDEPENDENT_AMBULATORY_CARE_PROVIDER_SITE_OTHER): Payer: BLUE CROSS/BLUE SHIELD

## 2014-02-20 ENCOUNTER — Ambulatory Visit (INDEPENDENT_AMBULATORY_CARE_PROVIDER_SITE_OTHER): Payer: BLUE CROSS/BLUE SHIELD | Admitting: Family Medicine

## 2014-02-20 ENCOUNTER — Ambulatory Visit: Payer: BC Managed Care – PPO

## 2014-02-20 VITALS — BP 128/80 | HR 88 | Temp 99.4°F | Ht 65.0 in | Wt 298.0 lb

## 2014-02-20 DIAGNOSIS — Z0000 Encounter for general adult medical examination without abnormal findings: Secondary | ICD-10-CM

## 2014-02-20 DIAGNOSIS — D6851 Activated protein C resistance: Secondary | ICD-10-CM

## 2014-02-20 DIAGNOSIS — Z5181 Encounter for therapeutic drug level monitoring: Secondary | ICD-10-CM

## 2014-02-20 DIAGNOSIS — R350 Frequency of micturition: Secondary | ICD-10-CM

## 2014-02-20 DIAGNOSIS — N390 Urinary tract infection, site not specified: Secondary | ICD-10-CM | POA: Insufficient documentation

## 2014-02-20 DIAGNOSIS — R739 Hyperglycemia, unspecified: Secondary | ICD-10-CM

## 2014-02-20 DIAGNOSIS — N3000 Acute cystitis without hematuria: Secondary | ICD-10-CM

## 2014-02-20 LAB — CBC WITH DIFFERENTIAL/PLATELET
BASOS ABS: 0 10*3/uL (ref 0.0–0.1)
Basophils Relative: 0.3 % (ref 0.0–3.0)
Eosinophils Absolute: 0.2 10*3/uL (ref 0.0–0.7)
Eosinophils Relative: 2.1 % (ref 0.0–5.0)
HCT: 38.8 % (ref 36.0–46.0)
Hemoglobin: 12.6 g/dL (ref 12.0–15.0)
LYMPHS ABS: 0.5 10*3/uL — AB (ref 0.7–4.0)
Lymphocytes Relative: 6.8 % — ABNORMAL LOW (ref 12.0–46.0)
MCHC: 32.5 g/dL (ref 30.0–36.0)
MCV: 85.5 fl (ref 78.0–100.0)
Monocytes Absolute: 0.5 10*3/uL (ref 0.1–1.0)
Monocytes Relative: 7.1 % (ref 3.0–12.0)
NEUTROS PCT: 83.7 % — AB (ref 43.0–77.0)
Neutro Abs: 6.4 10*3/uL (ref 1.4–7.7)
Platelets: 217 10*3/uL (ref 150.0–400.0)
RBC: 4.54 Mil/uL (ref 3.87–5.11)
RDW: 16.5 % — AB (ref 11.5–15.5)
WBC: 7.6 10*3/uL (ref 4.0–10.5)

## 2014-02-20 LAB — LIPID PANEL
CHOL/HDL RATIO: 6
Cholesterol: 245 mg/dL — ABNORMAL HIGH (ref 0–200)
HDL: 42.4 mg/dL (ref >39.00)
LDL CALC: 178 mg/dL — AB (ref 0–99)
NonHDL: 202.6
TRIGLYCERIDES: 122 mg/dL (ref 0.0–149.0)
VLDL: 24.4 mg/dL (ref 0.0–40.0)

## 2014-02-20 LAB — POCT URINALYSIS DIPSTICK
Bilirubin, UA: NEGATIVE
GLUCOSE UA: NEGATIVE
KETONES UA: NEGATIVE
Nitrite, UA: NEGATIVE
PH UA: 6
SPEC GRAV UA: 1.025
UROBILINOGEN UA: 0.2

## 2014-02-20 LAB — TSH: TSH: 12.41 u[IU]/mL — ABNORMAL HIGH (ref 0.35–4.50)

## 2014-02-20 LAB — COMPREHENSIVE METABOLIC PANEL
ALK PHOS: 92 U/L (ref 39–117)
ALT: 15 U/L (ref 0–35)
AST: 17 U/L (ref 0–37)
Albumin: 3.6 g/dL (ref 3.5–5.2)
BUN: 14 mg/dL (ref 6–23)
CALCIUM: 9.3 mg/dL (ref 8.4–10.5)
CO2: 28 meq/L (ref 19–32)
Chloride: 105 mEq/L (ref 96–112)
Creatinine, Ser: 1.11 mg/dL (ref 0.40–1.20)
GFR: 55.87 mL/min — ABNORMAL LOW (ref >60.00)
Glucose, Bld: 101 mg/dL — ABNORMAL HIGH (ref 70–99)
Potassium: 4.5 mEq/L (ref 3.5–5.1)
Sodium: 140 mEq/L (ref 135–145)
TOTAL PROTEIN: 7.1 g/dL (ref 6.0–8.3)
Total Bilirubin: 0.5 mg/dL (ref 0.2–1.2)

## 2014-02-20 LAB — HEMOGLOBIN A1C: HEMOGLOBIN A1C: 5.6 % (ref 4.6–6.5)

## 2014-02-20 LAB — VITAMIN B12: Vitamin B-12: 288 pg/mL (ref 211–911)

## 2014-02-20 LAB — POCT INR: INR: 3

## 2014-02-20 MED ORDER — SULFAMETHOXAZOLE-TRIMETHOPRIM 400-80 MG PO TABS: 1.0000 | ORAL_TABLET | Freq: Two times a day (BID) | ORAL | Status: DC

## 2014-02-20 NOTE — Progress Notes (Signed)
Pre visit review using our clinic review tool, if applicable. No additional management support is needed unless otherwise documented below in the visit note. 

## 2014-02-20 NOTE — Progress Notes (Signed)
Subjective:    Patient ID: [name], female    DOB: 07/22/1965, 49 y.o.   MRN: [id number]  HPI Here for uti   Started symptoms on Wednesday  Then progressively worse  Bladder and low back pain  Tingly sensation with urination  Frequency and urgency  No blood in her urine   Temp 99.4- low grade/ just feels bad   Lab Results  Component Value Date   CREATININE 1.1 12/06/2012   BUN 21.4 12/06/2012   NA 140 12/06/2012   K 5.1 12/06/2012   CL 109 11/05/2012   CO2 23 12/06/2012      Patient Active Problem List   Diagnosis Date Noted  . UTI (urinary tract infection) 02/20/2014  . Screening for osteoporosis 11/04/2013  . Headache(784.0) 10/25/2013  . Pain, eye, right 10/25/2013  . Encounter for therapeutic drug monitoring 03/25/2013  . Acute bacterial sinusitis 03/08/2013  . CKD (chronic kidney disease) stage 2, GFR 60-89 ml/min 12/20/2012  . Chronic lupus nephritis 12/20/2012  . ICH (intracerebral hemorrhage) 12/20/2012  . Routine general medical examination at a health care facility 12/07/2012  . Steroid long-term use 12/07/2012  . History of HPV infection 12/07/2012  . Long term (current) use of anticoagulants 10/06/2012  . Chronic anticoagulation 04/22/2012  . S/P bariatric surgery 04/22/2012  . Factor V Leiden 04/22/2012  . Dark urine 04/16/2012  . Urine abnormality 04/16/2012  . Neck pain 02/23/2012  . OSA on CPAP 11/20/2011  . Hyperglycemia 09/16/2010  . EDEMA 01/08/2010  . PLEURAL EFFUSION 08/27/2009  . ENLARGEMENT OF LYMPH NODES 08/13/2009  . Hx of PE 05/24/2009  . OBESITY 12/08/2007  . HYPOTHYROIDISM, POSTABLATION 06/09/2006  . HYPERCHOLESTEROLEMIA 06/09/2006  . ANXIETY 06/09/2006  . PANIC DISORDER 06/09/2006  . HYPERTENSION, ESSENTIAL NOS 06/09/2006  . GLOMERULONEPHRITIS 06/09/2006  . Systemic lupus erythematosus 06/09/2006  . DVT, HX OF 06/09/2006   Past Medical History  Diagnosis Date  . Nonspecific abnormal results of liver function study    . Personal history of venous thrombosis and embolism 2002    during pregnancy; ?factor 5 leiden (sees heme)  . Edema   . 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 Nephritis and nephropathy, not specified as acute or chronic, with unspecified pathological lesion in kidney   . Pure hypercholesterolemia   . Unspecified essential hypertension   . Other specified acquired hypothyroidism   . Obesity, unspecified   . Panic disorder without agoraphobia   . Unspecified pleural effusion   . Other pulmonary embolism and infarction   . 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 Pleural effusion 2005    c/w lupus initial w/u; recurrent Right as pred tapered off July 2011  . HLD (hyperlipidemia)   . Iron deficiency anemia     IV dextran Coladonato  . 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 GERD (gastroesophageal reflux disease)     on prilosec r/t gastric sleeve surgery  . Arthritis     Lupus - hands/knees  . Anxiety state, unspecified     panic attacks  . Sleep apnea    Past Surgical History  Procedure Laterality Date  . Pleuryx catheter placement  9/11     VanTright----removed 9/11  . Thoracentesis  2010    with penumonia  . Pleurocentesis  10/2004  . Gastric sleeve  8/12  bariatric surgery Dr Adolphus Birchwood   . Wisdom tooth extraction    . Svd      x 3  . Laparoscopic tubal ligation  12/09/2010    Procedure: LAPAROSCOPIC TUBAL LIGATION;  Surgeon: Juluis Mire;  Location: [facility];  Service: Gynecology;  Laterality: Bilateral;  Attempted see Nursing note  . Renal biopsy  09/24/2012   History  Substance Use Topics  . Smoking status: Former Smoker -- 0.50 packs/day for 10 years    Types: Cigarettes    Quit date: 05/04/2009  . Smokeless tobacco: Never Used     Comment: Socially x10 years (1 pack/month)  . Alcohol Use: Yes     Comment: socially   Family History  Problem Relation Age of  Onset  . Lupus Sister   . Diabetes      GM   Allergies  Allergen Reactions  . Amoxicillin     REACTION: Pt. reports allergy, reaction not known  . Lisinopril     cough  . Lovenox [Enoxaparin Sodium] Itching  . Moxifloxacin     REACTION: hallucinations   Current Outpatient Prescriptions on File Prior to Visit  Medication Sig Dispense Refill  . atorvastatin (LIPITOR) 10 MG tablet Take 1 tablet (10 mg total) by mouth daily. 30 tablet 0  . BIOTIN PO Take 1 capsule by mouth daily.    . Cyanocobalamin (VITAMIN B-12 PO) Take 1 capsule by mouth daily.    . hydroxychloroquine (PLAQUENIL) 200 MG tablet Take 200 mg by mouth 2 (two) times daily.     . IRON PO Take 1 tablet by mouth daily.    Marland Kitchen labetalol (NORMODYNE) 200 MG tablet Take 1 and 1/2 tablets  by mouth twice a day 270 tablet 1  . levothyroxine (SYNTHROID, LEVOTHROID) 175 MCG tablet Take 1 tablet (175 mcg total) by mouth daily. 30 tablet 0  . Multiple Vitamin (MULTIVITAMIN) tablet Take 1 tablet by mouth daily.    . mycophenolate (CELLCEPT) 500 MG tablet Take 1,000 mg by mouth 2 (two) times daily.     Marland Kitchen omeprazole (PRILOSEC) 20 MG capsule Take 20 mg by mouth daily.    Marland Kitchen PARoxetine (PAXIL) 40 MG tablet take 1 tablet by mouth every morning 30 tablet 11  . warfarin (COUMADIN) 7.5 MG tablet Take as directed by anticoagulation clinic 165 tablet 1  . warfarin (COUMADIN) 7.5 MG tablet TAKE AS DIRECTED BY ANTICOAGULATION CLINIC 165 tablet 0   No current facility-administered medications on file prior to visit.    Review of Systems Review of Systems  Constitutional: Negative for fever, appetite change, fatigue and unexpected weight change.  Eyes: Negative for pain and visual disturbance.  Respiratory: Negative for cough and shortness of breath.   Cardiovascular: Negative for cp or palpitations    Gastrointestinal: Negative for nausea, diarrhea and constipation.  Genitourinary: pos  for urgency and frequency. neg for hematuria and flank  pain, pos for low back pain  Skin: Negative for pallor or rash   Neurological: Negative for weakness, light-headedness, numbness and headaches.  Hematological: Negative for adenopathy. Does not bruise/bleed easily.  Psychiatric/Behavioral: Negative for dysphoric mood. The patient is not nervous/anxious.         Objective:   Physical Exam  Constitutional: She appears well-developed and well-nourished. No distress.  obese and well appearing   HENT:  Head: Normocephalic and atraumatic.  Eyes: Conjunctivae and EOM are normal. Pupils are equal, round, and reactive to light.  Neck: Normal range of motion. Neck supple.  Cardiovascular:  Normal rate and regular rhythm.   Murmur heard. Pulmonary/Chest: Effort normal and breath sounds normal.  Abdominal: Soft. Bowel sounds are normal. She exhibits no distension and no mass. There is tenderness. There is no rebound and no guarding.  Mild suprapubic tenderness No cva tenderness   Lymphadenopathy:    She has no cervical adenopathy.  Neurological: She is alert.  Skin: Skin is warm and dry. No rash noted.  Psychiatric: She has a normal mood and affect.          Assessment & Plan:   Problem List Items Addressed This Visit      Genitourinary   UTI (urinary tract infection) - Primary    In pt with renal disease  Will tx with renally dosed septra (she is all to amox - cannot give cephalosporins and also rxn to quinolone in the past)  Enc fluids cx sent  Will need early protime due to abx - she is aware       Relevant Medications   sulfamethoxazole-trimethoprim (BACTRIM) 400-80 MG per tablet   Other Relevant Orders   Urine culture    Other Visit Diagnoses    Urinary frequency        Relevant Orders    POCT urinalysis dipstick (Completed)

## 2014-02-20 NOTE — Assessment & Plan Note (Signed)
In pt with renal disease  Will tx with renally dosed septra (she is all to amox - cannot give cephalosporins and also rxn to quinolone in the past)  Enc fluids cx sent  Will need early protime due to abx - she is aware

## 2014-02-20 NOTE — Patient Instructions (Signed)
Start low dose septra today for 5 days Drink lots of water  Get protime today - and you may have to have it checked again  sooner than expected - due to the antibiotic   Update if not starting to improve in several days or if worsening    Labs today

## 2014-02-22 ENCOUNTER — Institutional Professional Consult (permissible substitution): Payer: BC Managed Care – PPO | Admitting: Cardiovascular Disease

## 2014-02-22 LAB — URINE CULTURE: Colony Count: >=100000

## 2014-02-28 ENCOUNTER — Encounter: Payer: Self-pay | Admitting: Family Medicine

## 2014-02-28 ENCOUNTER — Ambulatory Visit (INDEPENDENT_AMBULATORY_CARE_PROVIDER_SITE_OTHER): Payer: BLUE CROSS/BLUE SHIELD | Admitting: Family Medicine

## 2014-02-28 VITALS — BP 130/82 | HR 66 | Temp 98.0°F | Ht 65.0 in | Wt 295.1 lb

## 2014-02-28 DIAGNOSIS — E669 Obesity, unspecified: Secondary | ICD-10-CM

## 2014-02-28 DIAGNOSIS — I10 Essential (primary) hypertension: Secondary | ICD-10-CM

## 2014-02-28 DIAGNOSIS — Z0000 Encounter for general adult medical examination without abnormal findings: Secondary | ICD-10-CM

## 2014-02-28 DIAGNOSIS — E038 Other specified hypothyroidism: Secondary | ICD-10-CM

## 2014-02-28 DIAGNOSIS — E7800 Pure hypercholesterolemia, unspecified: Secondary | ICD-10-CM

## 2014-02-28 DIAGNOSIS — R739 Hyperglycemia, unspecified: Secondary | ICD-10-CM

## 2014-02-28 DIAGNOSIS — Z23 Encounter for immunization: Secondary | ICD-10-CM

## 2014-02-28 MED ORDER — LEVOTHYROXINE SODIUM 175 MCG PO TABS: 175.0000 ug | ORAL_TABLET | Freq: Every day | ORAL | Status: DC

## 2014-02-28 MED ORDER — OMEPRAZOLE 20 MG PO CPDR: 20.0000 mg | DELAYED_RELEASE_CAPSULE | Freq: Every day | ORAL | Status: DC

## 2014-02-28 MED ORDER — PAROXETINE HCL 40 MG PO TABS: 40.0000 mg | ORAL_TABLET | Freq: Every morning | ORAL | Status: DC

## 2014-02-28 MED ORDER — LABETALOL HCL 200 MG PO TABS: ORAL_TABLET | ORAL | Status: DC

## 2014-02-28 MED ORDER — ATORVASTATIN CALCIUM 20 MG PO TABS: 20.0000 mg | ORAL_TABLET | Freq: Every day | ORAL | Status: DC

## 2014-02-28 NOTE — Patient Instructions (Signed)
Tdap vaccine today  Increase your cholesterol medicine (atorvastatin) to 20 mg once daily  Schedule fasting labs for this and thyroid in 1 month   (get back on thyroid medicine -do not miss doses)  Add B12 supplement over the counter 1000 mcg daily  Take care of yourself

## 2014-02-28 NOTE — Progress Notes (Signed)
Pre visit review using our clinic review tool, if applicable. No additional management support is needed unless otherwise documented below in the visit note. 

## 2014-02-28 NOTE — Progress Notes (Signed)
Subjective:    Patient ID: [name], female    DOB: 1965-09-19, 49 y.o.   MRN: [id number]  HPI Here for health maintenance exam and to review chronic medical problems    Wt is down 3 lb  Remote hx of wt loss surgery  Doing better with diet and also drinking more water  Was not eating as much when she was sick Watching what she does eat more carefully  Has an elliptical and recumbent bike now - using it  Deals with plantar fasciitis    She is getting more energy with time  Is able to get through the work day better now   Td- unsure when her last one was  Flu shot - had in the fall   HIV screen- declines for now   Mammogram per pt was dec 2015 - [facility]- was fine  Self exam -no lumps   Pap 12/15 - had a pap at [facility]- and it was normal  Remote hx of HPV (had 6 mo paps for a while)   Recent e coli uti tx with low dose sulfa Feeling better from that  She is drinking lots of water    Hx of renal dz from SLE GFR is 55.8  Hyperlipidemia Lab Results  Component Value Date   CHOL 245* 02/20/2014   CHOL 265* 11/05/2012   CHOL 266* 06/03/2011   Lab Results  Component Value Date   HDL 42.40 02/20/2014   HDL 38* 11/05/2012   HDL 48.70 06/03/2011   Lab Results  Component Value Date   LDLCALC 178* 02/20/2014   LDLCALC 191* 11/05/2012   LDLCALC * 05/16/2009    192        Total Cholesterol/HDL:CHD Risk Coronary Heart Disease Risk Table                     Men   Women  1/2 Average Risk   3.4   3.3  Average Risk       5.0   4.4  2 X Average Risk   9.6   7.1  3 X Average Risk  23.4   11.0        Use the calculated Patient Ratio above and the CHD Risk Table to determine the patient's CHD Risk.        ATP III CLASSIFICATION (LDL):  <100     mg/dL   Optimal  782-956  mg/dL   Near or Above                    Optimal  130-159  mg/dL   Borderline  213-086  mg/dL   High  >578     mg/dL   Very High   Lab Results  Component Value  Date   TRIG 122.0 02/20/2014   TRIG 179* 11/05/2012   TRIG 113.0 06/03/2011   Lab Results  Component Value Date   CHOLHDL 6 02/20/2014   CHOLHDL 7.0 11/05/2012   CHOLHDL 5 06/03/2011   Lab Results  Component Value Date   LDLDIRECT 203.7 06/03/2011   LDLDIRECT 241.9 07/09/2009   LDLDIRECT 184.8 12/25/2008  improved with improved ratio  Diet is fair/ not always perfect but she makes an effort (eating more fast food and junk at the end of the year)  Is open to inc atorvastatin    B12 -oral suppl in the past /now on just mvi  Needs extra  Lab Results  Component Value Date   VITAMINB12 288 02/20/2014   Hyperglycemia Lab Results  Component Value Date   HGBA1C 5.6 02/20/2014  doing well with sugar control  This is stable   Hypothyroid Lab Results  Component Value Date   TSH 12.41* 02/20/2014   she did miss some doses - needs to get back on track   dexa nl 10/15 for screening   bp is stable today  No cp or palpitations or headaches or edema  No side effects to medicines  BP Readings from Last 3 Encounters:  02/28/14 130/82  02/20/14 128/80  12/06/13 146/86      Patient Active Problem List   Diagnosis Date Noted  . UTI (urinary tract infection) 02/20/2014  . Screening for osteoporosis 11/04/2013  . Headache(784.0) 10/25/2013  . Pain, eye, right 10/25/2013  . Encounter for therapeutic drug monitoring 03/25/2013  . Acute bacterial sinusitis 03/08/2013  . CKD (chronic kidney disease) stage 2, GFR 60-89 ml/min 12/20/2012  . Chronic lupus nephritis 12/20/2012  . ICH (intracerebral hemorrhage) 12/20/2012  . Routine general medical examination at a health care facility 12/07/2012  . Steroid long-term use 12/07/2012  . History of HPV infection 12/07/2012  . Long term (current) use of anticoagulants 10/06/2012  . Chronic anticoagulation 04/22/2012  . S/P bariatric surgery 04/22/2012  . Factor V Leiden 04/22/2012  . Dark urine 04/16/2012  . Urine abnormality  04/16/2012  . Neck pain 02/23/2012  . OSA on CPAP 11/20/2011  . Hyperglycemia 09/16/2010  . EDEMA 01/08/2010  . PLEURAL EFFUSION 08/27/2009  . ENLARGEMENT OF LYMPH NODES 08/13/2009  . Hx of PE 05/24/2009  . Obesity 12/08/2007  . Hypothyroidism 06/09/2006  . HYPERCHOLESTEROLEMIA 06/09/2006  . ANXIETY 06/09/2006  . PANIC DISORDER 06/09/2006  . Essential hypertension 06/09/2006  . GLOMERULONEPHRITIS 06/09/2006  . Systemic lupus erythematosus 06/09/2006  . DVT, HX OF 06/09/2006   Past Medical History  Diagnosis Date  . Nonspecific abnormal results of liver function study   . Personal history of venous thrombosis and embolism 2002    during pregnancy; ?factor 5 leiden (sees heme)  . Edema   . 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 Nephritis and nephropathy, not specified as acute or chronic, with unspecified pathological lesion in kidney   . Pure hypercholesterolemia   . Unspecified essential hypertension   . Other specified acquired hypothyroidism   . Obesity, unspecified   . Panic disorder without agoraphobia   . Unspecified pleural effusion   . Other pulmonary embolism and infarction   . 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 Pleural effusion 2005    c/w lupus initial w/u; recurrent Right as pred tapered off July 2011  . HLD (hyperlipidemia)   . Iron deficiency anemia     IV dextran Coladonato  . 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 GERD (gastroesophageal reflux disease)     on prilosec r/t gastric sleeve surgery  . Arthritis     Lupus - hands/knees  . Anxiety state, unspecified     panic attacks  . Sleep apnea    Past Surgical History  Procedure Laterality Date  . Pleuryx catheter placement  9/11     VanTright----removed 9/11  . Thoracentesis  2010    with penumonia  . Pleurocentesis  10/2004  . Gastric sleeve  8/12    bariatric surgery Dr  Dasher   .  Wisdom tooth extraction    . Svd      x 3  . Laparoscopic tubal ligation  12/09/2010    Procedure: LAPAROSCOPIC TUBAL LIGATION;  Surgeon: Juluis Mire;  Location: [facility];  Service: Gynecology;  Laterality: Bilateral;  Attempted see Nursing note  . Renal biopsy  09/24/2012   History  Substance Use Topics  . Smoking status: Former Smoker -- 0.50 packs/day for 10 years    Types: Cigarettes    Quit date: 05/04/2009  . Smokeless tobacco: Never Used     Comment: Socially x10 years (1 pack/month)  . Alcohol Use: Yes     Comment: socially   Family History  Problem Relation Age of Onset  . Lupus Sister   . Diabetes      GM   Allergies  Allergen Reactions  . Amoxicillin     REACTION: Pt. reports allergy, reaction not known  . Lisinopril     cough  . Lovenox [Enoxaparin Sodium] Itching  . Moxifloxacin     REACTION: hallucinations   Current Outpatient Prescriptions on File Prior to Visit  Medication Sig Dispense Refill  . atorvastatin (LIPITOR) 10 MG tablet Take 1 tablet (10 mg total) by mouth daily. 30 tablet 0  . BIOTIN PO Take 1 capsule by mouth daily.    . Cyanocobalamin (VITAMIN B-12 PO) Take 1 capsule by mouth daily.    . hydroxychloroquine (PLAQUENIL) 200 MG tablet Take 200 mg by mouth 2 (two) times daily.     . IRON PO Take 1 tablet by mouth daily.    Marland Kitchen labetalol (NORMODYNE) 200 MG tablet Take 1 and 1/2 tablets  by mouth twice a day 270 tablet 1  . levothyroxine (SYNTHROID, LEVOTHROID) 175 MCG tablet Take 1 tablet (175 mcg total) by mouth daily. 30 tablet 0  . Multiple Vitamin (MULTIVITAMIN) tablet Take 1 tablet by mouth daily.    . mycophenolate (CELLCEPT) 500 MG tablet Take 1,000 mg by mouth 2 (two) times daily.     Marland Kitchen omeprazole (PRILOSEC) 20 MG capsule Take 20 mg by mouth daily.    Marland Kitchen PARoxetine (PAXIL) 40 MG tablet take 1 tablet by mouth every morning 30 tablet 11  . sulfamethoxazole-trimethoprim (BACTRIM) 400-80 MG per tablet Take 1 tablet by mouth 2 (two) times daily.  10 tablet 0  . warfarin (COUMADIN) 7.5 MG tablet Take as directed by anticoagulation clinic 165 tablet 1  . warfarin (COUMADIN) 7.5 MG tablet TAKE AS DIRECTED BY ANTICOAGULATION CLINIC 165 tablet 0   No current facility-administered medications on file prior to visit.     Review of Systems Review of Systems  Constitutional: Negative for fever, appetite change, fatigue and unexpected weight change.  Eyes: Negative for pain and visual disturbance.  Respiratory: Negative for cough and shortness of breath.   Cardiovascular: Negative for cp or palpitations    Gastrointestinal: Negative for nausea, diarrhea and constipation.  Genitourinary: Negative for urgency and frequency.  Skin: Negative for pallor or rash   Neurological: Negative for weakness, light-headedness, numbness and headaches.  Hematological: Negative for adenopathy. Does not bruise/bleed easily.  Psychiatric/Behavioral: Negative for dysphoric mood. The patient is not nervous/anxious.         Objective:   Physical Exam  Constitutional: She appears well-developed and well-nourished. No distress.  obese and well appearing   HENT:  Head: Normocephalic and atraumatic.  Right Ear: External ear normal.  Left Ear: External ear normal.  Nose: Nose normal.  Mouth/Throat: Oropharynx  is clear and moist.  Eyes: Conjunctivae and EOM are normal. Pupils are equal, round, and reactive to light. Right eye exhibits no discharge. Left eye exhibits no discharge. No scleral icterus.  Neck: Normal range of motion. Neck supple. No JVD present. No thyromegaly present.  Cardiovascular: Normal rate, regular rhythm, normal heart sounds and intact distal pulses.  Exam reveals no gallop.   Pulmonary/Chest: Effort normal and breath sounds normal. No respiratory distress. She has no wheezes. She has no rales.  Abdominal: Soft. Bowel sounds are normal. She exhibits no distension and no mass. There is no tenderness.  Musculoskeletal: She exhibits no edema  or tenderness.  Lymphadenopathy:    She has no cervical adenopathy.  Neurological: She is alert. She has normal reflexes. No cranial nerve deficit. She exhibits normal muscle tone. Coordination normal.  Skin: Skin is warm and dry. No rash noted. No erythema. No pallor.  Baseline hyperpigmentation and firmness of skin on lower legs   Psychiatric: She has a normal mood and affect.          Assessment & Plan:   Problem List Items Addressed This Visit      Cardiovascular and Mediastinum   Essential hypertension - Primary   Relevant Medications   atorvastatin (LIPITOR) tablet   labetalol (NORMODYNE) tablet     Endocrine   Hypothyroidism    Lab Results  Component Value Date   TSH 12.41* 02/20/2014   Pt states she missed significant amt of doses  Will be compliant with dosing and re check this in about a month       Relevant Medications   labetalol (NORMODYNE) tablet   levothyroxine (SYNTHROID, LEVOTHROID) tablet     Other   HYPERCHOLESTEROLEMIA    Disc goals for lipids and reasons to control them Rev labs with pt Rev low sat fat diet in detail Not at goal  Will inc atorvastatin to 20 mg as tolerated Re check lipids in 1 mo approx       Relevant Medications   atorvastatin (LIPITOR) tablet   labetalol (NORMODYNE) tablet   Hyperglycemia    Lab Results  Component Value Date   HGBA1C 5.6 02/20/2014   Stable  Rev low glycemic diet Continues to try and loose wt       Obesity    Discussed how this problem influences overall health and the risks it imposes  Reviewed plan for weight loss with lower calorie diet (via better food choices and also portion control or program like weight watchers) and exercise building up to or more than 30 minutes 5 days per week including some aerobic activity    S/p bariatric surgery Will continue to work on wt loss       Routine general medical examination at a health care facility    Reviewed health habits including diet and  exercise and skin cancer prevention Reviewed appropriate screening tests for age  Also reviewed health mt list, fam hx and immunization status , as well as social and family history   Labs reviewed  Tdap given          Other Visit Diagnoses    Need for diphtheria-tetanus-pertussis (Tdap) vaccine        Relevant Orders    Tdap vaccine greater than or equal to 7yo IM (Completed)

## 2014-03-02 ENCOUNTER — Telehealth: Payer: Self-pay | Admitting: Family Medicine

## 2014-03-02 ENCOUNTER — Other Ambulatory Visit: Payer: BLUE CROSS/BLUE SHIELD

## 2014-03-02 NOTE — Telephone Encounter (Signed)
emmi emailed °

## 2014-03-02 NOTE — Assessment & Plan Note (Signed)
Lab Results  Component Value Date   HGBA1C 5.6 02/20/2014   Stable  Rev low glycemic diet Continues to try and loose wt

## 2014-03-02 NOTE — Assessment & Plan Note (Signed)
Lab Results  Component Value Date   TSH 12.41* 02/20/2014   Pt states she missed significant amt of doses  Will be compliant with dosing and re check this in about a month

## 2014-03-02 NOTE — Assessment & Plan Note (Signed)
Disc goals for lipids and reasons to control them Rev labs with pt Rev low sat fat diet in detail Not at goal  Will inc atorvastatin to 20 mg as tolerated Re check lipids in 1 mo approx

## 2014-03-02 NOTE — Assessment & Plan Note (Signed)
Discussed how this problem influences overall health and the risks it imposes  Reviewed plan for weight loss with lower calorie diet (via better food choices and also portion control or program like weight watchers) and exercise building up to or more than 30 minutes 5 days per week including some aerobic activity    S/p bariatric surgery Will continue to work on wt loss

## 2014-03-02 NOTE — Assessment & Plan Note (Signed)
Reviewed health habits including diet and exercise and skin cancer prevention Reviewed appropriate screening tests for age  Also reviewed health mt list, fam hx and immunization status , as well as social and family history   Labs reviewed  Tdap given

## 2014-03-13 ENCOUNTER — Institutional Professional Consult (permissible substitution): Payer: BLUE CROSS/BLUE SHIELD | Admitting: Cardiology

## 2014-04-06 ENCOUNTER — Other Ambulatory Visit: Payer: Self-pay | Admitting: Family Medicine

## 2014-04-06 ENCOUNTER — Other Ambulatory Visit (INDEPENDENT_AMBULATORY_CARE_PROVIDER_SITE_OTHER): Payer: BLUE CROSS/BLUE SHIELD

## 2014-04-06 DIAGNOSIS — Z5181 Encounter for therapeutic drug level monitoring: Secondary | ICD-10-CM

## 2014-04-06 LAB — POCT INR: INR: 4.5

## 2014-04-06 NOTE — Telephone Encounter (Signed)
Electronic refill request. Last Filled:    165 tablet 0 RF on 01/23/2014  Please advise.

## 2014-04-06 NOTE — Telephone Encounter (Signed)
Please refill times 2 

## 2014-04-07 NOTE — Telephone Encounter (Signed)
Done

## 2014-04-10 ENCOUNTER — Encounter: Payer: Self-pay | Admitting: Internal Medicine

## 2014-04-10 ENCOUNTER — Ambulatory Visit (INDEPENDENT_AMBULATORY_CARE_PROVIDER_SITE_OTHER): Payer: BLUE CROSS/BLUE SHIELD | Admitting: Internal Medicine

## 2014-04-10 VITALS — BP 128/82 | HR 70 | Ht 65.0 in | Wt 298.1 lb

## 2014-04-10 DIAGNOSIS — I10 Essential (primary) hypertension: Secondary | ICD-10-CM

## 2014-04-10 DIAGNOSIS — I350 Nonrheumatic aortic (valve) stenosis: Secondary | ICD-10-CM

## 2014-04-10 NOTE — Progress Notes (Signed)
Cardiology Office Note   Date:  04/10/2014   ID:  [name], DOB 01/16/1966, MRN [id number]  PCP:  Cheryl MannsMarne Tower, MD  Cardiologist:   Cheryl PatesPaula Jacarius Handel, MD   No chief complaint on file.  Patient referred for evaluation of a murmur     History of Present Illness: [name] is a 49 y.o. 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 atorvastatin (LIPITOR) 20 MG tablet Take 1 tablet (20 mg total) by mouth daily. 90 tablet 3  . BIOTIN PO Take 1 capsule by mouth daily.    . Cyanocobalamin (VITAMIN B-12 PO) Take 1 capsule by mouth daily.    . hydroxychloroquine (PLAQUENIL) 200 MG tablet Take 200 mg by mouth 2 (two) times daily.     . IRON PO Take 1 tablet by mouth daily.    Marland Kitchen. labetalol (NORMODYNE) 200 MG tablet Take 1 and 1/2 tablets  by mouth twice a day 270 tablet 3  . levothyroxine (SYNTHROID, LEVOTHROID) 175 MCG tablet Take 1 tablet (175 mcg total) by mouth daily. 90 tablet 3  . Multiple Vitamin (MULTIVITAMIN) tablet Take 1 tablet by mouth daily.    . mycophenolate (CELLCEPT) 500 MG tablet Take 1,000 mg by mouth 2 (two) times daily.     Marland Kitchen. omeprazole (PRILOSEC) 20 MG capsule Take 1 capsule (20 mg total) by mouth daily. 90 capsule 3  . PARoxetine (PAXIL) 40 MG tablet Take 1 tablet (40 mg total) by mouth every morning. 90 tablet 3  . warfarin (COUMADIN) 7.5 MG tablet Take as directed by anticoagulation clinic 165 tablet 1  . warfarin (COUMADIN) 7.5 MG tablet TAKE AS DIRECTED BY ANTICOAGULATION CLINIC 165 tablet 2   No current facility-administered medications for this visit.    Allergies:   Lisinopril; Lovenox; Moxifloxacin; and Amoxicillin   Past Medical History  Diagnosis Date  .  Nonspecific abnormal results of liver function study   . Personal history of venous thrombosis and embolism 2002    during pregnancy; ?factor 5 leiden (sees heme)  . Edema   . 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 Nephritis and nephropathy, not specified as acute or chronic, with unspecified pathological lesion in kidney   . Pure hypercholesterolemia   . Unspecified essential hypertension   . Other specified acquired hypothyroidism   . Obesity, unspecified   . Panic disorder without agoraphobia   . Unspecified pleural effusion   . Other pulmonary embolism and infarction   . 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 Pleural effusion 2005    c/w lupus initial w/u; recurrent Right as pred tapered off July 2011  . HLD (hyperlipidemia)   . Iron deficiency anemia     IV dextran Coladonato  . 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 GERD (gastroesophageal reflux disease)     on prilosec r/t gastric sleeve surgery  . Arthritis     Lupus - hands/knees  . Anxiety state, unspecified     panic  attacks  . Sleep apnea     Past Surgical History  Procedure Laterality Date  . Pleuryx catheter placement  9/11     VanTright----removed 9/11  . Thoracentesis  2010    with penumonia  . Pleurocentesis  10/2004  . Gastric sleeve  8/12    bariatric surgery [name]   . Wisdom tooth extraction    . Svd      x 3  . Laparoscopic tubal ligation  12/09/2010    Procedure: LAPAROSCOPIC TUBAL LIGATION;  Surgeon: [name];  Location: [facility];  Service: Gynecology;  Laterality: Bilateral;  Attempted see Nursing note  . Renal biopsy  09/24/2012     Social History:  The patient  reports that she quit smoking about 4 years ago. Her smoking use included Cigarettes. She has a 5 pack-year smoking history. She has never used smokeless tobacco. She reports that she drinks alcohol. She reports that she does  not use illicit drugs.   Family History:  [name] family history includes Diabetes in an other family member; Hyperlipidemia in her father; Hypertension in her father; Leukemia in her sister; Lupus in her sister.    ROS:  Please see the history of present illness. All other systems are reviewed and  Negative to the above problem except as noted.    PHYSICAL EXAM: VS:  BP 128/82 mmHg  Pulse 70  Ht  (1.651 m)  Wt 298 lb 1.9 oz (135.226 kg)  BMI 49.61 kg/m2  GEN: Morbidly obese 49 yo in no acute distress HEENT: normal Neck: no JVD  Murmur radiating to neck   Cardiac: RRR;  II/VI systolic murmur LSB to base  No S3   Respiratory:  clear to auscultation bilaterally, normal work of breathing GI: soft, nontender,  No definte masses  MS: no deformity Moving all extremities   Extem;  No edema  2+ pulses   Skin: warm and dry, no rash Neuro:  Strength and sensation are intact Psych: euthymic mood, full affect   EKG:  EKG is ordered today.  SR 70     Lipid Panel    Component Value Date/Time   CHOL 245* 02/20/2014 0851   TRIG 122.0 02/20/2014 0851   HDL 42.40 02/20/2014 0851   CHOLHDL 6 02/20/2014 0851   VLDL 24.4 02/20/2014 0851   LDLCALC 178* 02/20/2014 0851   LDLDIRECT 203.7 06/03/2011 1433      Wt Readings from Last 3 Encounters:  04/10/14 298 lb 1.9 oz (135.226 kg)  02/28/14 295 lb 1.9 oz (133.866 kg)  02/20/14 298 lb (135.172 kg)      ASSESSMENT AND PLAN:  1  Aortic stenosis.  I have reviewed echo  Overall I think AS is prob mild to moderate Gradients have progressed from echo done in 2011 I would followc  Get echo in 1 year.   With Lupus history and Hx PE I would treat with ABX prior to dental work  Allergic to amoxicllin  Would Rx Clindamycin  She has taken before  2.  Hyperlipidemia  Signif elevation  On lipitor now  I would treat aggressively   3.  Factor V leiden  Hxd clots  Followed in Heme  On coumadin  4.  Lupus  Hx nephritis  Has been on  prednisone      Current medicines are reviewed at length with the patient today.  The patient does not have concerns regarding medicines.  The following changes have been made: None  Labs/ tests ordered today include:  Echo in December Have lipids forwarded to me   See in December   No orders of the defined types were placed in this encounter.     Signed, Cheryl Pates, MD  04/10/2014 10:17 AM    [facility] [address] [place], [place], [place]  [address] Phone: [phone]; Fax: [phone]

## 2014-04-10 NOTE — Patient Instructions (Signed)
Your physician recommends that you continue on your current medications as directed. Please refer to the Current Medication list given to you today.  Your physician has requested that you have an echocardiogram in 9 months. Echocardiography is a painless test that uses sound waves to create images of your heart. It provides your doctor with information about the size and shape of your heart and how well your heart's chambers and valves are working. This procedure takes approximately one hour. There are no restrictions for this procedure.  Your physician wants you to follow-up in: 9 months with Dr. Tenny Crawoss. You will receive a reminder letter in the mail two months in advance. If you don't receive a letter, please call our office to schedule the follow-up appointment.

## 2014-04-13 ENCOUNTER — Other Ambulatory Visit (INDEPENDENT_AMBULATORY_CARE_PROVIDER_SITE_OTHER): Payer: BLUE CROSS/BLUE SHIELD

## 2014-04-13 DIAGNOSIS — Z5181 Encounter for therapeutic drug level monitoring: Secondary | ICD-10-CM

## 2014-04-13 LAB — POCT INR: INR: 2.6

## 2014-04-14 ENCOUNTER — Telehealth: Payer: Self-pay

## 2014-04-14 NOTE — Telephone Encounter (Signed)
Patient aware of INR results and appointment made.

## 2014-04-14 NOTE — Telephone Encounter (Signed)
-----   Message from Judy PimpleMarne A Tower, MD sent at 04/13/2014  3:54 PM EST ----- Continue current dose and re check in 2 weeks  Is in the goal range of 2.5 to 3.5

## 2014-05-01 ENCOUNTER — Other Ambulatory Visit: Payer: BLUE CROSS/BLUE SHIELD

## 2014-05-17 ENCOUNTER — Ambulatory Visit (INDEPENDENT_AMBULATORY_CARE_PROVIDER_SITE_OTHER): Payer: BLUE CROSS/BLUE SHIELD

## 2014-05-17 DIAGNOSIS — D6851 Activated protein C resistance: Secondary | ICD-10-CM | POA: Diagnosis not present

## 2014-05-17 DIAGNOSIS — Z5181 Encounter for therapeutic drug level monitoring: Secondary | ICD-10-CM | POA: Diagnosis not present

## 2014-05-17 LAB — POCT INR: INR: 3.8

## 2014-05-17 NOTE — Progress Notes (Signed)
Agree with plan 

## 2014-05-17 NOTE — Progress Notes (Signed)
Pre visit review using our clinic review tool, if applicable. No additional management support is needed unless otherwise documented below in the visit note. 

## 2014-06-14 ENCOUNTER — Ambulatory Visit: Payer: BLUE CROSS/BLUE SHIELD

## 2014-06-20 ENCOUNTER — Ambulatory Visit (INDEPENDENT_AMBULATORY_CARE_PROVIDER_SITE_OTHER): Payer: BLUE CROSS/BLUE SHIELD | Admitting: General Practice

## 2014-06-20 DIAGNOSIS — Z5181 Encounter for therapeutic drug level monitoring: Secondary | ICD-10-CM

## 2014-06-20 DIAGNOSIS — D6851 Activated protein C resistance: Secondary | ICD-10-CM

## 2014-06-20 LAB — POCT INR: INR: 2.2

## 2014-06-20 NOTE — Progress Notes (Signed)
Agree with plan 

## 2014-06-20 NOTE — Progress Notes (Signed)
Pre visit review using our clinic review tool, if applicable. No additional management support is needed unless otherwise documented below in the visit note. 

## 2014-07-19 ENCOUNTER — Ambulatory Visit: Payer: BLUE CROSS/BLUE SHIELD

## 2014-08-15 ENCOUNTER — Ambulatory Visit (INDEPENDENT_AMBULATORY_CARE_PROVIDER_SITE_OTHER): Payer: Managed Care, Other (non HMO) | Admitting: General Practice

## 2014-08-15 DIAGNOSIS — Z5181 Encounter for therapeutic drug level monitoring: Secondary | ICD-10-CM | POA: Diagnosis not present

## 2014-08-15 DIAGNOSIS — D6851 Activated protein C resistance: Secondary | ICD-10-CM | POA: Diagnosis not present

## 2014-08-15 LAB — POCT INR: INR: 1.9

## 2014-08-15 NOTE — Progress Notes (Signed)
Pre visit review using our clinic review tool, if applicable. No additional management support is needed unless otherwise documented below in the visit note. 

## 2014-08-15 NOTE — Progress Notes (Signed)
I have reviewed and agree with the plan. 

## 2014-09-13 ENCOUNTER — Ambulatory Visit (INDEPENDENT_AMBULATORY_CARE_PROVIDER_SITE_OTHER): Payer: Managed Care, Other (non HMO) | Admitting: General Practice

## 2014-09-13 DIAGNOSIS — D6851 Activated protein C resistance: Secondary | ICD-10-CM | POA: Diagnosis not present

## 2014-09-13 DIAGNOSIS — Z5181 Encounter for therapeutic drug level monitoring: Secondary | ICD-10-CM

## 2014-09-13 LAB — POCT INR: INR: 6.1

## 2014-09-13 NOTE — Progress Notes (Signed)
Pre visit review using our clinic review tool, if applicable. No additional management support is needed unless otherwise documented below in the visit note. 

## 2014-09-13 NOTE — Progress Notes (Signed)
I have reviewed and agree with the plan. 

## 2014-09-14 ENCOUNTER — Other Ambulatory Visit: Payer: Self-pay | Admitting: *Deleted

## 2014-09-14 MED ORDER — WARFARIN SODIUM 7.5 MG PO TABS: ORAL_TABLET | ORAL | Status: DC

## 2014-09-14 MED ORDER — LABETALOL HCL 200 MG PO TABS: ORAL_TABLET | ORAL | Status: DC

## 2014-09-14 MED ORDER — PAROXETINE HCL 40 MG PO TABS: 40.0000 mg | ORAL_TABLET | Freq: Every morning | ORAL | Status: DC

## 2014-09-14 MED ORDER — LEVOTHYROXINE SODIUM 175 MCG PO TABS: 175.0000 ug | ORAL_TABLET | Freq: Every day | ORAL | Status: DC

## 2014-09-14 MED ORDER — ATORVASTATIN CALCIUM 20 MG PO TABS: 20.0000 mg | ORAL_TABLET | Freq: Every day | ORAL | Status: DC

## 2014-09-14 MED ORDER — OMEPRAZOLE 20 MG PO CPDR: 20.0000 mg | DELAYED_RELEASE_CAPSULE | Freq: Every day | ORAL | Status: DC

## 2014-09-14 NOTE — Telephone Encounter (Signed)
Rxs sent and pt notified to contact hematologist to get plaquenil refilled

## 2014-09-14 NOTE — Telephone Encounter (Signed)
Please refill all for a year except plaquenil -I think that comes from her hematologist

## 2014-09-14 NOTE — Telephone Encounter (Signed)
Pt sent a fax saying her insurance has changed and so has her mail order pharmacy so she needs new Rxs sent to [facility], please advise

## 2014-09-27 ENCOUNTER — Ambulatory Visit (INDEPENDENT_AMBULATORY_CARE_PROVIDER_SITE_OTHER): Payer: Managed Care, Other (non HMO) | Admitting: General Practice

## 2014-09-27 DIAGNOSIS — Z5181 Encounter for therapeutic drug level monitoring: Secondary | ICD-10-CM | POA: Diagnosis not present

## 2014-09-27 DIAGNOSIS — D6851 Activated protein C resistance: Secondary | ICD-10-CM

## 2014-09-27 LAB — POCT INR: INR: 2.9

## 2014-09-27 NOTE — Progress Notes (Signed)
I have reviewed and agree with the plan. 

## 2014-09-27 NOTE — Progress Notes (Signed)
Pre visit review using our clinic review tool, if applicable. No additional management support is needed unless otherwise documented below in the visit note. 

## 2014-10-25 ENCOUNTER — Ambulatory Visit (INDEPENDENT_AMBULATORY_CARE_PROVIDER_SITE_OTHER): Payer: Managed Care, Other (non HMO) | Admitting: General Practice

## 2014-10-25 DIAGNOSIS — Z5181 Encounter for therapeutic drug level monitoring: Secondary | ICD-10-CM

## 2014-10-25 DIAGNOSIS — D6851 Activated protein C resistance: Secondary | ICD-10-CM | POA: Diagnosis not present

## 2014-10-25 LAB — POCT INR: INR: 2.3

## 2014-10-25 NOTE — Progress Notes (Signed)
Pre visit review using our clinic review tool, if applicable. No additional management support is needed unless otherwise documented below in the visit note. 

## 2014-10-25 NOTE — Progress Notes (Signed)
I have reviewed and agree with the plan. 

## 2014-11-22 ENCOUNTER — Ambulatory Visit (INDEPENDENT_AMBULATORY_CARE_PROVIDER_SITE_OTHER): Payer: Managed Care, Other (non HMO) | Admitting: General Practice

## 2014-11-22 DIAGNOSIS — Z5181 Encounter for therapeutic drug level monitoring: Secondary | ICD-10-CM | POA: Diagnosis not present

## 2014-11-22 DIAGNOSIS — D6851 Activated protein C resistance: Secondary | ICD-10-CM | POA: Diagnosis not present

## 2014-11-22 LAB — POCT INR: INR: 2.1

## 2014-11-22 NOTE — Progress Notes (Signed)
Pre visit review using our clinic review tool, if applicable. No additional management support is needed unless otherwise documented below in the visit note. 

## 2014-11-22 NOTE — Progress Notes (Signed)
I have reviewed and agree with the plan. 

## 2014-12-27 ENCOUNTER — Ambulatory Visit (INDEPENDENT_AMBULATORY_CARE_PROVIDER_SITE_OTHER): Payer: Managed Care, Other (non HMO) | Admitting: General Practice

## 2014-12-27 DIAGNOSIS — Z5181 Encounter for therapeutic drug level monitoring: Secondary | ICD-10-CM | POA: Diagnosis not present

## 2014-12-27 DIAGNOSIS — D6851 Activated protein C resistance: Secondary | ICD-10-CM

## 2014-12-27 LAB — POCT INR: INR: 3.3

## 2014-12-27 NOTE — Progress Notes (Signed)
I have reviewed and agree with the plan. 

## 2014-12-27 NOTE — Progress Notes (Signed)
Pre visit review using our clinic review tool, if applicable. No additional management support is needed unless otherwise documented below in the visit note. 

## 2015-01-12 ENCOUNTER — Other Ambulatory Visit: Payer: Self-pay | Admitting: Internal Medicine

## 2015-01-12 DIAGNOSIS — R609 Edema, unspecified: Secondary | ICD-10-CM

## 2015-01-12 DIAGNOSIS — Z8709 Personal history of other diseases of the respiratory system: Secondary | ICD-10-CM

## 2015-01-15 ENCOUNTER — Ambulatory Visit (INDEPENDENT_AMBULATORY_CARE_PROVIDER_SITE_OTHER): Payer: Managed Care, Other (non HMO) | Admitting: Internal Medicine

## 2015-01-15 ENCOUNTER — Ambulatory Visit (HOSPITAL_COMMUNITY): Payer: Managed Care, Other (non HMO) | Attending: Cardiology

## 2015-01-15 ENCOUNTER — Encounter: Payer: Self-pay | Admitting: Internal Medicine

## 2015-01-15 ENCOUNTER — Other Ambulatory Visit: Payer: Self-pay

## 2015-01-15 DIAGNOSIS — E7800 Pure hypercholesterolemia, unspecified: Secondary | ICD-10-CM | POA: Insufficient documentation

## 2015-01-15 DIAGNOSIS — I10 Essential (primary) hypertension: Secondary | ICD-10-CM | POA: Diagnosis not present

## 2015-01-15 DIAGNOSIS — Z87891 Personal history of nicotine dependence: Secondary | ICD-10-CM | POA: Insufficient documentation

## 2015-01-15 DIAGNOSIS — I2782 Chronic pulmonary embolism: Secondary | ICD-10-CM

## 2015-01-15 DIAGNOSIS — I517 Cardiomegaly: Secondary | ICD-10-CM | POA: Diagnosis not present

## 2015-01-15 DIAGNOSIS — I059 Rheumatic mitral valve disease, unspecified: Secondary | ICD-10-CM | POA: Diagnosis not present

## 2015-01-15 DIAGNOSIS — Z6841 Body Mass Index (BMI) 40.0 and over, adult: Secondary | ICD-10-CM | POA: Diagnosis not present

## 2015-01-15 DIAGNOSIS — R609 Edema, unspecified: Secondary | ICD-10-CM | POA: Diagnosis not present

## 2015-01-15 DIAGNOSIS — Z8709 Personal history of other diseases of the respiratory system: Secondary | ICD-10-CM

## 2015-01-15 DIAGNOSIS — I350 Nonrheumatic aortic (valve) stenosis: Secondary | ICD-10-CM | POA: Insufficient documentation

## 2015-01-15 DIAGNOSIS — I071 Rheumatic tricuspid insufficiency: Secondary | ICD-10-CM | POA: Insufficient documentation

## 2015-01-15 NOTE — Patient Instructions (Signed)
Your physician recommends that you continue on your current medications as directed. Please refer to the Current Medication list given to you today. Your physician wants you to follow-up in: 1 year with Dr. Ross.  You will receive a reminder letter in the mail two months in advance. If you don't receive a letter, please call our office to schedule the follow-up appointment.  

## 2015-01-15 NOTE — Progress Notes (Signed)
Cardiology Office Note   Date:  01/15/2015   ID:  Cheryl DenverLori A Vogler, DOB 11/11/1965, MRN [id number]  PCP:  Roxy MannsMarne Tower, MD  Cardiologist:   Dietrich PatesPaula Lyndel Sarate, MD   No chief complaint on file.       History of Present Illness: [name] is a 49 y.o. 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 atorvastatin (LIPITOR) 20 MG tablet Take 1 tablet (20 mg total) by mouth daily. 90 tablet 3  . BIOTIN PO Take 1 capsule by mouth daily.    . Cyanocobalamin (VITAMIN B-12 PO) Take 1 capsule by mouth daily.    . hydroxychloroquine (PLAQUENIL) 200 MG tablet Take 200 mg by mouth 2 (two) times daily.     . IRON PO Take 1 tablet by mouth daily.    Marland Kitchen. labetalol (NORMODYNE) 200 MG tablet Take 1 and 1/2 tablets  by mouth twice a day 270 tablet 3  . levothyroxine (SYNTHROID, LEVOTHROID) 175 MCG tablet Take 1 tablet (175 mcg total) by mouth daily. 90 tablet 3  . Multiple Vitamin (MULTIVITAMIN) tablet Take 1 tablet by mouth daily.    Marland Kitchen. omeprazole (PRILOSEC) 20 MG capsule Take 20 mg by mouth 2 (two) times daily before a meal.    . PARoxetine (PAXIL) 40 MG tablet Take 1 tablet (40 mg total) by mouth every morning. 90 tablet 3  . warfarin (COUMADIN) 7.5 MG tablet Take as directed by anticoagulation clinic 165 tablet 3   No current facility-administered medications for this visit.    Allergies:   Lisinopril; Lovenox; Moxifloxacin; and Amoxicillin   Past Medical History  Diagnosis Date  . Nonspecific abnormal results of liver function study   . Personal history of venous thrombosis and embolism 2002    during pregnancy; ?factor 5 leiden (sees heme)  . Edema   . 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 Nephritis and nephropathy, not specified as acute or chronic, with unspecified pathological lesion in kidney   . Pure hypercholesterolemia   . Unspecified essential hypertension   . Other specified acquired hypothyroidism   . Obesity, unspecified   . Panic disorder without agoraphobia   . Unspecified pleural effusion   . Other pulmonary embolism and infarction   . Systemic lupus erythematosus (HCC)     renal GN, hx of pericardial eff in late 90's  . Pleural effusion 2005    c/w lupus initial w/u; recurrent Right as pred tapered off July 2011  . HLD (hyperlipidemia)   . Iron deficiency anemia     IV dextran Coladonato  . 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 GERD (gastroesophageal reflux disease)     on prilosec r/t gastric sleeve surgery  . Arthritis     Lupus - hands/knees  . Anxiety state, unspecified     panic attacks  . Sleep apnea     Past Surgical History  Procedure Laterality Date  . Pleuryx catheter placement  9/11     VanTright----removed 9/11  . Thoracentesis  2010  with penumonia  . Pleurocentesis  10/2004  . Gastric sleeve  8/12    bariatric surgery Dr Adolphus Birchwood   . Wisdom tooth extraction    . Svd      x 3  . Laparoscopic tubal ligation  12/09/2010    Procedure: LAPAROSCOPIC TUBAL LIGATION;  Surgeon: Juluis Mire;  Location: [facility];  Service: Gynecology;  Laterality: Bilateral;  Attempted see Nursing note  . Renal biopsy  09/24/2012     Social History:  The patient  reports that she quit smoking about 5 years ago. Her smoking use included Cigarettes. She has a 5 pack-year smoking history. She has never used smokeless tobacco. She reports that she drinks alcohol. She reports that she does not use illicit drugs.   Family History:  [name] family history includes Diabetes in an other family member; Hyperlipidemia in her father; Hypertension in her father; Leukemia in her sister; Lupus in her  sister.    ROS:  Please see the history of present illness. All other systems are reviewed and  Negative to the above problem except as noted.    PHYSICAL EXAM: VS:  BP 124/80 mmHg  Pulse 71  Ht  (1.651 m)  Wt 134.265 kg (296 lb)  BMI 49.26 kg/m2  GEN: Morbidly obese 49 yo in no acute distress HEENT: normal Neck: no JVD, carotid bruits, or masses Cardiac: RRR; no murmurs, rubs, or gallops,no edema  Respiratory:  clear to auscultation bilaterally, normal work of breathing GI: soft, nontender, nondistended, + BS  No hepatomegaly  MS: no deformity Moving all extremities   Skin: warm and dry, no rash Neuro:  Strength and sensation are intact Psych: euthymic mood, full affect   EKG:  EKG is not ordered today.   Lipid Panel    Component Value Date/Time   CHOL 245* 02/20/2014 0851   TRIG 122.0 02/20/2014 0851   HDL 42.40 02/20/2014 0851   CHOLHDL 6 02/20/2014 0851   VLDL 24.4 02/20/2014 0851   LDLCALC 178* 02/20/2014 0851   LDLDIRECT 203.7 06/03/2011 1433      Wt Readings from Last 3 Encounters:  01/15/15 134.265 kg (296 lb)  04/10/14 135.226 kg (298 lb 1.9 oz)  02/28/14 133.866 kg (295 lb 1.9 oz)      ASSESSMENT AND PLAN:  1  Hx hypercoagulable  Conutinue coumadin  Check CBC     Signed, Dietrich Pates, MD  01/15/2015 4:04 PM    [facility] [address] [place], [place], [place]  [address] Phone: [phone]; Fax: [phone]

## 2015-01-24 ENCOUNTER — Ambulatory Visit (INDEPENDENT_AMBULATORY_CARE_PROVIDER_SITE_OTHER): Payer: Managed Care, Other (non HMO) | Admitting: General Practice

## 2015-01-24 DIAGNOSIS — D6851 Activated protein C resistance: Secondary | ICD-10-CM

## 2015-01-24 DIAGNOSIS — Z5181 Encounter for therapeutic drug level monitoring: Secondary | ICD-10-CM | POA: Diagnosis not present

## 2015-01-24 LAB — POCT INR: INR: 3.9

## 2015-01-24 NOTE — Progress Notes (Signed)
Pre visit review using our clinic review tool, if applicable. No additional management support is needed unless otherwise documented below in the visit note. 

## 2015-01-24 NOTE — Progress Notes (Signed)
I have reviewed and agree with the plan. 

## 2015-02-21 ENCOUNTER — Ambulatory Visit: Payer: Managed Care, Other (non HMO)

## 2015-03-21 ENCOUNTER — Ambulatory Visit (INDEPENDENT_AMBULATORY_CARE_PROVIDER_SITE_OTHER): Payer: Managed Care, Other (non HMO) | Admitting: General Practice

## 2015-03-21 DIAGNOSIS — Z5181 Encounter for therapeutic drug level monitoring: Secondary | ICD-10-CM

## 2015-03-21 DIAGNOSIS — D6851 Activated protein C resistance: Secondary | ICD-10-CM | POA: Diagnosis not present

## 2015-03-21 LAB — POCT INR: INR: 4.2

## 2015-03-21 NOTE — Progress Notes (Signed)
I have reviewed and agree with the plan. 

## 2015-03-21 NOTE — Progress Notes (Signed)
Pre visit review using our clinic review tool, if applicable. No additional management support is needed unless otherwise documented below in the visit note. 

## 2015-04-18 ENCOUNTER — Ambulatory Visit: Payer: Managed Care, Other (non HMO)

## 2015-04-20 ENCOUNTER — Ambulatory Visit (INDEPENDENT_AMBULATORY_CARE_PROVIDER_SITE_OTHER): Payer: Managed Care, Other (non HMO) | Admitting: General Practice

## 2015-04-20 DIAGNOSIS — Z5181 Encounter for therapeutic drug level monitoring: Secondary | ICD-10-CM | POA: Diagnosis not present

## 2015-04-20 DIAGNOSIS — D6851 Activated protein C resistance: Secondary | ICD-10-CM

## 2015-04-20 LAB — POCT INR: INR: 3.5

## 2015-04-20 NOTE — Progress Notes (Signed)
I have reviewed and agree with the plan. 

## 2015-04-20 NOTE — Progress Notes (Signed)
Pre visit review using our clinic review tool, if applicable. No additional management support is needed unless otherwise documented below in the visit note. 

## 2015-05-25 ENCOUNTER — Ambulatory Visit (INDEPENDENT_AMBULATORY_CARE_PROVIDER_SITE_OTHER): Payer: Managed Care, Other (non HMO) | Admitting: General Practice

## 2015-05-25 DIAGNOSIS — D6851 Activated protein C resistance: Secondary | ICD-10-CM | POA: Diagnosis not present

## 2015-05-25 DIAGNOSIS — Z5181 Encounter for therapeutic drug level monitoring: Secondary | ICD-10-CM

## 2015-05-25 LAB — POCT INR: INR: 4.2

## 2015-05-25 NOTE — Progress Notes (Signed)
I have reviewed and agree with the plan. 

## 2015-05-25 NOTE — Progress Notes (Signed)
Pre visit review using our clinic review tool, if applicable. No additional management support is needed unless otherwise documented below in the visit note. 

## 2015-06-04 DIAGNOSIS — Z7689 Persons encountering health services in other specified circumstances: Secondary | ICD-10-CM

## 2015-06-14 ENCOUNTER — Telehealth: Payer: Self-pay | Admitting: Family Medicine

## 2015-06-14 ENCOUNTER — Ambulatory Visit (INDEPENDENT_AMBULATORY_CARE_PROVIDER_SITE_OTHER): Payer: Managed Care, Other (non HMO) | Admitting: Family Medicine

## 2015-06-14 ENCOUNTER — Encounter: Payer: Self-pay | Admitting: Family Medicine

## 2015-06-14 VITALS — BP 118/84 | HR 75 | Temp 98.0°F | Wt 291.5 lb

## 2015-06-14 DIAGNOSIS — B9689 Other specified bacterial agents as the cause of diseases classified elsewhere: Secondary | ICD-10-CM

## 2015-06-14 DIAGNOSIS — J019 Acute sinusitis, unspecified: Secondary | ICD-10-CM

## 2015-06-14 MED ORDER — AZITHROMYCIN 250 MG PO TABS: ORAL_TABLET | ORAL | Status: DC

## 2015-06-14 NOTE — Progress Notes (Signed)
Pre visit review using our clinic review tool, if applicable. No additional management support is needed unless otherwise documented below in the visit note. 

## 2015-06-14 NOTE — Progress Notes (Signed)
Subjective:   Patient ID: [name], female    DOB: 08-Oct-1965, 50 y.o.   MRN: [id number]  [name] is a pleasant 49 y.o. year old female  Of Dr. Milinda Antis, new to me, who presents to clinic today with Headache  on 06/14/2015  HPI:   3 weeks of progressive frontal headache. Ears are full too, some nasal drainage.  Tylenol and sudafed have helped some. Headache feels different than when she had a brain bleed.  No associated photophobia, nausea or vomiting. No fever.  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 atorvastatin (LIPITOR) 20 MG tablet Take 1 tablet (20 mg total) by mouth daily. 90 tablet 3  . BIOTIN PO Take 1 capsule by mouth daily.    . Cyanocobalamin (VITAMIN B-12 PO) Take 1 capsule by mouth daily.    . IRON PO Take 1 tablet by mouth daily.    Marland Kitchen labetalol (NORMODYNE) 200 MG tablet Take 1 and 1/2 tablets  by mouth twice a day 270 tablet 3  . levothyroxine (SYNTHROID, LEVOTHROID) 175 MCG tablet Take 1 tablet (175 mcg total) by mouth daily. 90 tablet 3  . Multiple Vitamin (MULTIVITAMIN) tablet Take 1 tablet by mouth daily.    Marland Kitchen omeprazole (PRILOSEC) 20 MG capsule Take 20 mg by mouth 2 (two) times daily before a meal.    . PARoxetine (PAXIL) 40 MG tablet Take 1 tablet (40 mg total) by mouth every morning. 90 tablet 3  . warfarin (COUMADIN) 7.5 MG tablet Take as directed by anticoagulation clinic 165 tablet 3   No current facility-administered medications on file prior to visit.    Allergies  Allergen Reactions  . Lisinopril Cough  . Lovenox [Enoxaparin Sodium] Itching  . Moxifloxacin Other (See Comments)    REACTION: hallucinations  . Amoxicillin Other (See Comments)    unknown    Past Medical History  Diagnosis Date  . Nonspecific abnormal results of  liver function study   . Personal history of venous thrombosis and embolism 2002    during pregnancy; ?factor 5 leiden (sees heme)  . Edema   . 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 Nephritis and nephropathy, not specified as acute or chronic, with unspecified pathological lesion in kidney   . Pure hypercholesterolemia   . Unspecified essential hypertension   . Other specified acquired hypothyroidism   . Obesity, unspecified   . Panic disorder without agoraphobia   . Unspecified pleural effusion   . Other pulmonary embolism and infarction   . Systemic lupus erythematosus (HCC)     renal GN, hx of pericardial eff in late 90's  . Pleural effusion 2005    c/w lupus initial w/u; recurrent Right as pred tapered off July 2011  . HLD (hyperlipidemia)   . Iron deficiency anemia     IV dextran Coladonato  . 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 GERD (gastroesophageal reflux disease)     on prilosec r/t gastric sleeve surgery  . Arthritis     Lupus - hands/knees  . Anxiety state, unspecified     panic attacks  . Sleep apnea     Past Surgical History  Procedure Laterality Date  .  Pleuryx catheter placement  9/11     VanTright----removed 9/11  . Thoracentesis  2010    with penumonia  . Pleurocentesis  10/2004  . Gastric sleeve  8/12    bariatric surgery Dr Adolphus Birchwoodasher   . Wisdom tooth extraction    . Svd      x 3  . Laparoscopic tubal ligation  12/09/2010    Procedure: LAPAROSCOPIC TUBAL LIGATION;  Surgeon: Juluis MireJohn S McComb;  Location: [facility];  Service: Gynecology;  Laterality: Bilateral;  Attempted see Nursing note  . Renal biopsy  09/24/2012    Family History  Problem Relation Age of Onset  . Lupus Sister   . Diabetes      GM  . Hypertension Father   . Hyperlipidemia Father   . Leukemia Sister     Social History   Social History  . Marital Status: Divorced    Spouse Name: N/A  . Number of Children: 3  .  Years of Education: N/A   Occupational History  . Trainer at [employer]    Social History Main Topics  . Smoking status: Former Smoker -- 0.50 packs/day for 10 years    Types: Cigarettes    Quit date: 05/04/2009  . Smokeless tobacco: Never Used     Comment: Socially x10 years (1 pack/month)  . Alcohol Use: Yes     Comment: socially  . Drug Use: No  . Sexual Activity: Yes    Birth Control/ Protection: Condom   Other Topics Concern  . Not on file   Social History Narrative   The PMH, PSH, Social History, Family History, Medications, and allergies [facility], and have been updated if relevant.  Review of Systems  Constitutional: Negative.   HENT: Positive for congestion, postnasal drip, rhinorrhea and sinus pressure. Negative for dental problem, sneezing, sore throat, tinnitus, trouble swallowing and voice change.   Gastrointestinal: Negative.   Musculoskeletal: Negative.   Skin: Negative.   Neurological: Negative.   Hematological: Negative.   All other systems reviewed and are negative.      Objective:    BP 118/84 mmHg  Pulse 75  Temp(Src) 98 F (36.7 C) (Oral)  Wt 291 lb 8 oz (132.224 kg)  SpO2 93%   Physical Exam  Constitutional: She is oriented to person, place, and time. She appears well-developed and well-nourished. No distress.  HENT:  Head: Normocephalic and atraumatic.  Right Ear: Hearing normal. Tympanic membrane is retracted.  Left Ear: Hearing normal. Tympanic membrane is retracted.  Nose: Mucosal edema and rhinorrhea present. Right sinus exhibits frontal sinus tenderness. Right sinus exhibits no maxillary sinus tenderness. Left sinus exhibits maxillary sinus tenderness and frontal sinus tenderness.  Mouth/Throat: Uvula is midline. Posterior oropharyngeal erythema present. No posterior oropharyngeal edema.  Eyes: Conjunctivae are normal.  Cardiovascular: Normal rate and regular rhythm.   Pulmonary/Chest: Effort normal and breath  sounds normal.  Musculoskeletal: Normal range of motion.  Neurological: She is alert and oriented to person, place, and time. No cranial nerve deficit.  Skin: Skin is warm and dry. She is not diaphoretic.  Psychiatric: She has a normal mood and affect. Her behavior is normal. Judgment and thought content normal.  Nursing note and vitals reviewed.         Assessment & Plan:   Acute bacterial sinusitis No Follow-up on file.

## 2015-06-14 NOTE — Patient Instructions (Signed)
Great to see you. Take zpack as directed.  Call your coumadin clinic right away to let them know that you are taking an antibiotic.

## 2015-06-14 NOTE — Telephone Encounter (Signed)
Pt dropped of handicap form to be filled out In dr rx tower up front Please advise when ready for pick up

## 2015-06-14 NOTE — Telephone Encounter (Signed)
Form is in your in box

## 2015-06-14 NOTE — Assessment & Plan Note (Signed)
Given duration and progression of symptoms, will treat for bacterial sinusitis with zpack. Advised to call her coumadin clinic ASAP to let them know she is taking antibiotic. Look for signs of bleeding. Ok to continue sudafed and tylenol as needed for supportive care. Call or return to clinic prn if these symptoms worsen or fail to improve as anticipated. The patient indicates understanding of these issues and agrees with the plan.

## 2015-06-18 NOTE — Telephone Encounter (Signed)
Called and notified patient that form is ready for pick up.

## 2015-06-18 NOTE — Telephone Encounter (Signed)
Done and in IN box 

## 2015-06-20 ENCOUNTER — Encounter: Payer: Self-pay | Admitting: Family Medicine

## 2015-06-20 ENCOUNTER — Emergency Department (HOSPITAL_BASED_OUTPATIENT_CLINIC_OR_DEPARTMENT_OTHER)
Admission: EM | Admit: 2015-06-20 | Discharge: 2015-06-20 | Disposition: A | Discharge status: Home / Self Care | Payer: Managed Care, Other (non HMO) | Attending: Emergency Medicine | Admitting: Emergency Medicine

## 2015-06-20 ENCOUNTER — Emergency Department (HOSPITAL_BASED_OUTPATIENT_CLINIC_OR_DEPARTMENT_OTHER): Payer: Managed Care, Other (non HMO)

## 2015-06-20 ENCOUNTER — Encounter (HOSPITAL_BASED_OUTPATIENT_CLINIC_OR_DEPARTMENT_OTHER): Payer: Self-pay

## 2015-06-20 ENCOUNTER — Ambulatory Visit: Payer: Managed Care, Other (non HMO)

## 2015-06-20 DIAGNOSIS — Z87891 Personal history of nicotine dependence: Secondary | ICD-10-CM | POA: Insufficient documentation

## 2015-06-20 DIAGNOSIS — E669 Obesity, unspecified: Secondary | ICD-10-CM | POA: Diagnosis not present

## 2015-06-20 DIAGNOSIS — E038 Other specified hypothyroidism: Secondary | ICD-10-CM | POA: Insufficient documentation

## 2015-06-20 DIAGNOSIS — Z79899 Other long term (current) drug therapy: Secondary | ICD-10-CM | POA: Insufficient documentation

## 2015-06-20 DIAGNOSIS — R51 Headache: Secondary | ICD-10-CM | POA: Insufficient documentation

## 2015-06-20 DIAGNOSIS — R519 Headache, unspecified: Secondary | ICD-10-CM

## 2015-06-20 DIAGNOSIS — M542 Cervicalgia: Secondary | ICD-10-CM | POA: Insufficient documentation

## 2015-06-20 DIAGNOSIS — Z6841 Body Mass Index (BMI) 40.0 and over, adult: Secondary | ICD-10-CM | POA: Diagnosis not present

## 2015-06-20 DIAGNOSIS — E785 Hyperlipidemia, unspecified: Secondary | ICD-10-CM | POA: Diagnosis not present

## 2015-06-20 DIAGNOSIS — I10 Essential (primary) hypertension: Secondary | ICD-10-CM | POA: Insufficient documentation

## 2015-06-20 MED ORDER — CYCLOBENZAPRINE HCL 5 MG PO TABS
5.0000 mg | ORAL_TABLET | Freq: Every day | ORAL | Status: DC
Start: 2015-06-20 — End: 2015-06-29

## 2015-06-20 MED ORDER — HYDROCODONE-ACETAMINOPHEN 5-325 MG PO TABS
1.0000 | ORAL_TABLET | Freq: Once | ORAL | Status: AC
  Administered 2015-06-20: 1 via ORAL
  Filled 2015-06-20: qty 1

## 2015-06-20 MED FILL — CYCLOBENZAPRINE 5 MG TABLET: 5 | 20 days supply | Qty: 20 | Fill #0

## 2015-06-20 NOTE — ED Notes (Addendum)
Returned from Radiology.

## 2015-06-20 NOTE — ED Provider Notes (Signed)
CSN: [id number]     Arrival date & time 06/20/15  1306 History   First MD Initiated Contact with Patient 06/20/15 1337     Chief Complaint  Patient presents with  . Headache     (Consider location/radiation/quality/duration/timing/severity/associated sxs/prior Treatment) HPI  Patient is a 50 year old female with a history of lupus that presents to the [facility] emergency department with a four-week history of headache. She states that the headache has worsened since 1 month ago. Her pain radiates from her bilateral temples to the occipital region. She reports that getting up from a lying position sometimes makes the pain worse. She last saw her primary care physician a week ago and was prescribed azithromycin for a sinus infection and ear infection. She states that the antibiotic have not helped. She has tried Tylenol and Sudafed which helped with headache, but headache returns. She has no associated lightheadedness or dizziness, photophobia, gait issues, voiding or bowel issues. She has a history of a brain bleed from an unknown cause. 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  Past Medical History  Diagnosis Date  . Nonspecific abnormal results of liver function study   . Personal history of venous thrombosis and embolism 2002    during pregnancy; ?factor 5 leiden (sees heme)  . Edema   . 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 Nephritis and nephropathy, not specified as acute or chronic, with unspecified pathological lesion in kidney   . Pure hypercholesterolemia   . Unspecified essential hypertension   . Other specified acquired hypothyroidism   . Obesity, unspecified   . Panic disorder without agoraphobia   . Unspecified pleural effusion   . Other pulmonary embolism and infarction   . Systemic lupus erythematosus (HCC)     renal GN, hx of pericardial eff in late 90's  . Pleural  effusion 2005    c/w lupus initial w/u; recurrent Right as pred tapered off July 2011  . HLD (hyperlipidemia)   . Iron deficiency anemia     IV dextran Coladonato  . 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 GERD (gastroesophageal reflux disease)     on prilosec r/t gastric sleeve surgery  . Arthritis     Lupus - hands/knees  . Anxiety state, unspecified     panic attacks  . Sleep apnea    Past Surgical History  Procedure Laterality Date  . Pleuryx catheter placement  9/11     VanTright----removed 9/11  . Thoracentesis  2010    with penumonia  . Pleurocentesis  10/2004  . Gastric sleeve  8/12    bariatric surgery Dr Adolphus Birchwood   . Wisdom tooth extraction    . Svd      x 3  . Laparoscopic tubal ligation  12/09/2010    Procedure: LAPAROSCOPIC TUBAL LIGATION;  Surgeon: Juluis Mire;  Location: [facility];  Service: Gynecology;  Laterality: Bilateral;  Attempted see Nursing note  . Renal biopsy  09/24/2012   Family History  Problem Relation Age of Onset  . Lupus Sister   . Diabetes      GM  . Hypertension Father   . Hyperlipidemia Father   . Leukemia Sister    Social History  Substance Use Topics  . Smoking status: Former Smoker -- 0.50 packs/day for 10 years    Types: Cigarettes    Quit  date: 05/04/2009  . Smokeless tobacco: Never Used     Comment: Socially x10 years (1 pack/month)  . Alcohol Use: Yes     Comment: socially   OB History    No data available     Review of Systems  Constitutional: Negative for fever, chills and diaphoresis.  HENT: Positive for sinus pressure. Negative for rhinorrhea and sneezing.   Eyes: Negative for photophobia and visual disturbance.  Respiratory: Negative for chest tightness and shortness of breath.   Cardiovascular: Negative for chest pain.  Musculoskeletal: Positive for neck pain.  Neurological: Positive for headaches. Negative for dizziness, syncope, weakness and light-headedness.  Psychiatric/Behavioral: Negative for  confusion and decreased concentration.  All other systems reviewed and are negative.     Allergies  Lisinopril; Lovenox; Moxifloxacin; and Amoxicillin  Home Medications   Prior to Admission medications   Medication Sig Start Date End Date Taking? Authorizing Provider  atorvastatin (LIPITOR) 20 MG tablet Take 1 tablet (20 mg total) by mouth daily. 09/14/14   Judy Pimple, MD  azithromycin (ZITHROMAX Z-PAK) 250 MG tablet Take 2 pills today by mouth and then 1 pill daily for 4 more days 06/14/15   Dianne Dun, MD  BIOTIN PO Take 1 capsule by mouth daily.    Historical Provider, MD  Cyanocobalamin (VITAMIN B-12 PO) Take 1 capsule by mouth daily.    Historical Provider, MD  cyclobenzaprine (FLEXERIL) 5 MG tablet Take 1 tablet (5 mg total) by mouth at bedtime. 06/20/15   Narda Bonds, MD  IRON PO Take 1 tablet by mouth daily.    Historical Provider, MD  labetalol (NORMODYNE) 200 MG tablet Take 1 and 1/2 tablets  by mouth twice a day 09/14/14   Judy Pimple, MD  levothyroxine (SYNTHROID, LEVOTHROID) 175 MCG tablet Take 1 tablet (175 mcg total) by mouth daily. 09/14/14   Judy Pimple, MD  Multiple Vitamin (MULTIVITAMIN) tablet Take 1 tablet by mouth daily.    Historical Provider, MD  omeprazole (PRILOSEC) 20 MG capsule Take 20 mg by mouth 2 (two) times daily before a meal.    Historical Provider, MD  PARoxetine (PAXIL) 40 MG tablet Take 1 tablet (40 mg total) by mouth every morning. 09/14/14   Judy Pimple, MD  warfarin (COUMADIN) 7.5 MG tablet Take as directed by anticoagulation clinic 09/14/14   Judy Pimple, MD   BP 139/88 mmHg  Pulse 75  Temp(Src) 98.7 F (37.1 C) (Oral)  Resp 18  Ht 5\' 5"  (1.651 m)  Wt 131.997 kg  BMI 48.43 kg/m2  SpO2 96% Physical Exam  Constitutional: She is oriented to person, place, and time. She appears well-developed and well-nourished.  HENT:  Head: Normocephalic.  Eyes: Conjunctivae are normal. Pupils are equal, round, and reactive to light.  Neck:  Normal range of motion. Neck supple.  Cardiovascular: Normal rate and regular rhythm.   Murmur heard.  Systolic murmur is present with a grade of 2/6  Pulmonary/Chest: Effort normal and breath sounds normal. No respiratory distress. She has no wheezes. She has no rales.  Abdominal: Soft. Bowel sounds are normal. She exhibits no distension. There is no tenderness. There is no rebound.  Musculoskeletal:       Cervical back: She exhibits tenderness (along trapezius muscles bilaterally) and spasm. She exhibits normal range of motion and no bony tenderness.  Lymphadenopathy:    She has no cervical adenopathy.  Neurological: She is alert and oriented to person, place, and time. She has normal  strength and normal reflexes. She is not disoriented. No cranial nerve deficit or sensory deficit.  Skin: Skin is warm and dry.    ED Course  Procedures (including critical care time) Labs Review Labs Reviewed - No data to display  Imaging Review Ct Head Wo Contrast  06/20/2015  CLINICAL DATA:  Headache for 4 weeks. EXAM: CT HEAD WITHOUT CONTRAST TECHNIQUE: Contiguous axial images were obtained from the base of the skull through the vertex without intravenous contrast. COMPARISON:  10/25/2013 FINDINGS: Skull and Sinuses:Negative for fracture or destructive process. The visualized mastoids, middle ears, and imaged paranasal sinuses are clear. Visualized orbits: Negative. Brain: No evidence of acute infarction, hemorrhage, hydrocephalus, or mass lesion/mass effect. Extensive calcified intracranial atherosclerosis for age. Chronic remote lacunar infarct in the right caudate head. Small-vessel ischemic change in the cerebral white matter and pons is underestimated compared to 2014 brain MRI. IMPRESSION: 1. No acute finding. 2. Chronic small vessel disease and premature atherosclerotic calcification. Electronically Signed   By: Marnee SpringJonathon  Watts M.D.   On: 06/20/2015 15:07   I have personally reviewed and evaluated  these images and lab results as part of my medical decision-making.   EKG Interpretation None      MDM   Final diagnoses:  Chronic nonintractable headache, unspecified headache type   Patient with chronic headache. CT head significant for no acute intracranial process. Patient's neuro exam is unremarkable. Symptoms improved slightly with pain medication given in ED, especially her neck pain. Likely has some neck strain from poor posture. Unsure of etiology of headache. Recommended to follow-up with neurology. Discussed red flags. Patient is reassured and agrees to plan. Stable for discharge home.    Narda Bondsalph A Nettey, MD 06/20/15 1529  Marily MemosJason Mesner, MD 06/21/15 [phone]

## 2015-06-20 NOTE — ED Notes (Signed)
MD at bedside. 

## 2015-06-20 NOTE — ED Notes (Signed)
C/o HA (pain to bilat temporal and to posterior neck) x 4 weeks-saw PCP last week-dx with sinus infection-completed abx-NAD-steady gait-hx of "brain bleed" approx 2 years ago

## 2015-06-20 NOTE — ED Notes (Signed)
Pt teaching provided on medications that may cause drowsiness. Pt instructed not to drive or operate heavy machinery while taking the prescribed medication. Pt verbalized understanding.   

## 2015-06-20 NOTE — Discharge Instructions (Signed)
Your physical exam did not reveal any red flags for bad causes of headache. Your head CT did not show evidence of bleeding in your head/brain. You had evidence of tight muscles in her neck on your exam. You will be discharged with a muscle relaxer. Please continue Tylenol as needed. Please follow-up with the neurologist on your follow-up instructions. If symptoms worsen, please return for reevaluation.

## 2015-06-22 ENCOUNTER — Ambulatory Visit: Payer: Managed Care, Other (non HMO)

## 2015-06-22 ENCOUNTER — Ambulatory Visit: Payer: Managed Care, Other (non HMO) | Admitting: Family Medicine

## 2015-06-22 DIAGNOSIS — Z0289 Encounter for other administrative examinations: Secondary | ICD-10-CM

## 2015-06-26 ENCOUNTER — Ambulatory Visit: Payer: Self-pay | Admitting: Family Medicine

## 2015-06-29 ENCOUNTER — Ambulatory Visit (INDEPENDENT_AMBULATORY_CARE_PROVIDER_SITE_OTHER): Payer: Managed Care, Other (non HMO) | Admitting: *Deleted

## 2015-06-29 ENCOUNTER — Encounter: Payer: Self-pay | Admitting: Family Medicine

## 2015-06-29 ENCOUNTER — Ambulatory Visit (INDEPENDENT_AMBULATORY_CARE_PROVIDER_SITE_OTHER): Payer: Managed Care, Other (non HMO) | Admitting: Family Medicine

## 2015-06-29 VITALS — BP 126/84 | HR 64 | Temp 97.6°F | Ht 65.0 in | Wt 295.0 lb

## 2015-06-29 DIAGNOSIS — G4452 New daily persistent headache (NDPH): Secondary | ICD-10-CM

## 2015-06-29 DIAGNOSIS — R51 Headache: Secondary | ICD-10-CM

## 2015-06-29 DIAGNOSIS — D6851 Activated protein C resistance: Secondary | ICD-10-CM | POA: Diagnosis not present

## 2015-06-29 DIAGNOSIS — R519 Headache, unspecified: Secondary | ICD-10-CM | POA: Insufficient documentation

## 2015-06-29 DIAGNOSIS — J309 Allergic rhinitis, unspecified: Secondary | ICD-10-CM

## 2015-06-29 DIAGNOSIS — Z5181 Encounter for therapeutic drug level monitoring: Secondary | ICD-10-CM

## 2015-06-29 DIAGNOSIS — Z8679 Personal history of other diseases of the circulatory system: Secondary | ICD-10-CM

## 2015-06-29 DIAGNOSIS — Z7901 Long term (current) use of anticoagulants: Secondary | ICD-10-CM | POA: Diagnosis not present

## 2015-06-29 LAB — POCT INR: INR: 5.2

## 2015-06-29 MED ORDER — CYCLOBENZAPRINE HCL 5 MG PO TABS
5.0000 mg | ORAL_TABLET | Freq: Three times a day (TID) | ORAL | Status: DC | PRN
Start: 2015-06-29 — End: 2015-09-19

## 2015-06-29 NOTE — Progress Notes (Signed)
Subjective:    Patient ID: [name], female    DOB: Jun 10, 1965, 50 y.o.   MRN: [id number]  HPI Here for ED f/u for headache   Was seen in ED at [facility] 06/20/15 C/o 1 mo of HA Hx of ICH in the past - so CT was done Reassuring with some findings of small vessel dz  Had been tx for sinusitis with zpak prior to this with no imp- Dr Dayton Martes  Headache continues  Still has some congestion -takes sudafed and tylenol  BP Readings from Last 3 Encounters:  06/29/15 126/84  06/20/15 148/97  06/14/15 118/84    Pain started in back of neck and radiates to head  It has moved to R temple and around R eye  Ears feel full  Has had migraines in the past   Pain is constant - and better if not moving  Not throbbing  No vision change  No headache rel nausea  No sens to light or sound   No neuro or stroke symptoms   Medicines have not changed recently   Recent opth appt -no change in her glasses/all was well   For headache- she takes tylenol/sudafed and tries to work through them  Flexeril for several nights -helped sleep / ? If helped headache   Drinks a lot of coffee - drinks at least 6 per day (some decaf)   Goes away with sleep but wakes up with it   No hx of head trauma   Missed her INR last week  Cindy checks it at [facility]  Patient Active Problem List   Diagnosis Date Noted  . Headache 06/29/2015  . UTI (urinary tract infection) 02/20/2014  . Screening for osteoporosis 11/04/2013  . Headache(784.0) 10/25/2013  . Pain, eye, right 10/25/2013  . Encounter for therapeutic drug monitoring 03/25/2013  . Acute bacterial sinusitis 03/08/2013  . CKD (chronic kidney disease) stage 2, GFR 60-89 ml/min 12/20/2012  . 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 ICH (intracerebral hemorrhage) (HCC) 12/20/2012  . Routine general medical examination at a health care facility 12/07/2012  . Steroid long-term use 12/07/2012  . History of HPV infection 12/07/2012  . Long term (current) use of  anticoagulants 10/06/2012  . Chronic anticoagulation 04/22/2012  . S/P bariatric surgery 04/22/2012  . Factor V Leiden (HCC) 04/22/2012  . Dark urine 04/16/2012  . Urine abnormality 04/16/2012  . Neck pain 02/23/2012  . OSA on CPAP 11/20/2011  . Hyperglycemia 09/16/2010  . EDEMA 01/08/2010  . PLEURAL EFFUSION 08/27/2009  . ENLARGEMENT OF LYMPH NODES 08/13/2009  . Hx of PE 05/24/2009  . Obesity 12/08/2007  . Hypothyroidism 06/09/2006  . HYPERCHOLESTEROLEMIA 06/09/2006  . ANXIETY 06/09/2006  . PANIC DISORDER 06/09/2006  . Essential hypertension 06/09/2006  . GLOMERULONEPHRITIS 06/09/2006  . Systemic lupus erythematosus (HCC) 06/09/2006  . DVT, HX OF 06/09/2006   Past Medical History  Diagnosis Date  . Nonspecific abnormal results of liver function study   . Personal history of venous thrombosis and embolism 2002    during pregnancy; ?factor 5 leiden (sees heme)  . Edema   . 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 Nephritis and nephropathy, not specified as acute or chronic, with unspecified pathological lesion in kidney   . Pure hypercholesterolemia   . Unspecified essential hypertension   . Other specified acquired hypothyroidism   . Obesity, unspecified   . Panic disorder without agoraphobia   . Unspecified  pleural effusion   . Other pulmonary embolism and infarction   . Systemic lupus erythematosus (HCC)     renal GN, hx of pericardial eff in late 90's  . Pleural effusion 2005    c/w lupus initial w/u; recurrent Right as pred tapered off July 2011  . HLD (hyperlipidemia)   . Iron deficiency anemia     IV dextran Coladonato  . 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 GERD (gastroesophageal reflux disease)     on prilosec r/t gastric sleeve surgery  . Arthritis     Lupus - hands/knees  . Anxiety state, unspecified     panic attacks  . Sleep apnea    Past Surgical History  Procedure Laterality Date  .  Pleuryx catheter placement  9/11     VanTright----removed 9/11  . Thoracentesis  2010    with penumonia  . Pleurocentesis  10/2004  . Gastric sleeve  8/12    bariatric surgery Dr Adolphus Birchwoodasher   . Wisdom tooth extraction    . Svd      x 3  . Laparoscopic tubal ligation  12/09/2010    Procedure: LAPAROSCOPIC TUBAL LIGATION;  Surgeon: Juluis MireJohn S McComb;  Location: [facility];  Service: Gynecology;  Laterality: Bilateral;  Attempted see Nursing note  . Renal biopsy  09/24/2012   Social History  Substance Use Topics  . Smoking status: Former Smoker -- 0.50 packs/day for 10 years    Types: Cigarettes    Quit date: 05/04/2009  . Smokeless tobacco: Never Used     Comment: Socially x10 years (1 pack/month)  . Alcohol Use: 0.0 oz/week    0 Standard drinks or equivalent per week     Comment: socially   Family History  Problem Relation Age of Onset  . Lupus Sister   . Diabetes      GM  . Hypertension Father   . Hyperlipidemia Father   . Leukemia Sister    Allergies  Allergen Reactions  . Lisinopril Cough  . Lovenox [Enoxaparin Sodium] Itching  . Moxifloxacin Other (See Comments)    REACTION: hallucinations  . Amoxicillin Other (See Comments)    unknown   Current Outpatient Prescriptions on File Prior to Visit  Medication Sig Dispense Refill  . atorvastatin (LIPITOR) 20 MG tablet Take 1 tablet (20 mg total) by mouth daily. 90 tablet 3  . BIOTIN PO Take 1 capsule by mouth daily.    . Cyanocobalamin (VITAMIN B-12 PO) Take 1 capsule by mouth daily.    . IRON PO Take 1 tablet by mouth daily.    Marland Kitchen. labetalol (NORMODYNE) 200 MG tablet Take 1 and 1/2 tablets  by mouth twice a day 270 tablet 3  . levothyroxine (SYNTHROID, LEVOTHROID) 175 MCG tablet Take 1 tablet (175 mcg total) by mouth daily. 90 tablet 3  . Multiple Vitamin (MULTIVITAMIN) tablet Take 1 tablet by mouth daily.    Marland Kitchen. omeprazole (PRILOSEC) 20 MG capsule Take 20 mg by mouth 2 (two) times daily before a meal.    . PARoxetine (PAXIL) 40 MG  tablet Take 1 tablet (40 mg total) by mouth every morning. 90 tablet 3  . warfarin (COUMADIN) 7.5 MG tablet Take as directed by anticoagulation clinic 165 tablet 3   No current facility-administered medications on file prior to visit.     Review of Systems Review of Systems  Constitutional: Negative for fever, appetite change, fatigue and unexpected weight change.  Eyes: Negative for pain and  visual disturbance.  Respiratory: Negative for cough and shortness of breath.   Cardiovascular: Negative for cp or palpitations    Gastrointestinal: Negative for nausea, diarrhea and constipation.  Genitourinary: Negative for urgency and frequency.  Skin: Negative for pallor or rash   Neurological: Negative for weakness, light-headedness, numbness and pos for headaches.  Hematological: Negative for adenopathy. Does not bruise/bleed easily.  Psychiatric/Behavioral: Negative for dysphoric mood. The patient is not nervous/anxious.         Objective:   Physical Exam  Constitutional: She is oriented to person, place, and time. She appears well-developed and well-nourished. No distress.  Obese and well appearing   HENT:  Head: Normocephalic and atraumatic.  Right Ear: External ear normal.  Left Ear: External ear normal.  Nose: Nose normal.  Mouth/Throat: Oropharynx is clear and moist. No oropharyngeal exudate.  No sinus tenderness No temporal tenderness  No TMJ tenderness  Nares are boggy and congested with some clear pnd  Eyes: Conjunctivae and EOM are normal. Pupils are equal, round, and reactive to light. Right eye exhibits no discharge. Left eye exhibits no discharge. No scleral icterus.  No nystagmus  Neck: Normal range of motion and full passive range of motion without pain. Neck supple. No JVD present. Carotid bruit is not present. No tracheal deviation present. No thyromegaly present.  Cardiovascular: Normal rate and regular rhythm.   Murmur heard. Pulmonary/Chest: Effort normal and  breath sounds normal. No respiratory distress. She has no wheezes. She has no rales.  Abdominal: Soft. Bowel sounds are normal. She exhibits no distension and no mass. There is no tenderness.  Musculoskeletal: She exhibits no edema or tenderness.  Lymphadenopathy:    She has no cervical adenopathy.  Neurological: She is alert and oriented to person, place, and time. She has normal strength and normal reflexes. She displays no atrophy and no tremor. No cranial nerve deficit or sensory deficit. She exhibits normal muscle tone. She displays a negative Romberg sign. Coordination and gait normal.  No focal cerebellar signs   Skin: Skin is warm and dry. No rash noted. No pallor.  Psychiatric: She has a normal mood and affect. Her behavior is normal. Thought content normal.          Assessment & Plan:   Problem List Items Addressed This Visit      Respiratory   Allergic rhinitis    With congestion but not signs of bacterial infx  Worsening headache however  Will try flonase or nasacort ns daily to help with symptoms and update if not helpful        Hematopoietic and Hemostatic   Factor V Leiden (HCC)     Other   Long term (current) use of anticoagulants   History of cerebral hemorrhage    This is resolved and last CT is reassuring       Headache - Primary    Rev ED report and Ct scan-reassuring  Suspect this is a tension headache that with other triggers transformed into a migraine w/o aura  Will stop sudafed and tylenol - this is creating rebound Use flexeril tid over the 3 d weekend with lots of fluids  Start to reduce caffeine gradually next wk  Control allergies better by adding steroid nasal spray  Use cold compress on side of head where headache is  Go bo ED if severe h/a (pt has remote hx of hemorrhagic brain event on anticoag) Will update next week and plan further action if not resolving >25 minutes spent  in face to face time with patient, >50% spent in counselling or  coordination of care       Relevant Medications   cyclobenzaprine (FLEXERIL) 5 MG tablet   Encounter for therapeutic drug monitoring

## 2015-06-29 NOTE — Progress Notes (Signed)
Pre visit review using our clinic review tool, if applicable. No additional management support is needed unless otherwise documented below in the visit note. 

## 2015-06-29 NOTE — Patient Instructions (Addendum)
Get flonase or nasacort nasal spray over the counter  Use it daily  Stop sudafed Stop tylenol  Drink lots of water Use a cold compress on the right side of your head  Over the weekend for at least 2-3 days take your flexeril three times daily (careful of sedation)  Then - start cutting caffeine by one serving per week until you are down to 1 cup per day  Update if not starting to improve in a week or if worsening   If your headache suddenly worsens- go to the ED  INR today

## 2015-07-01 DIAGNOSIS — J309 Allergic rhinitis, unspecified: Secondary | ICD-10-CM | POA: Insufficient documentation

## 2015-07-01 DIAGNOSIS — Z8679 Personal history of other diseases of the circulatory system: Secondary | ICD-10-CM | POA: Insufficient documentation

## 2015-07-01 NOTE — Assessment & Plan Note (Signed)
This is resolved and last CT is reassuring

## 2015-07-01 NOTE — Assessment & Plan Note (Signed)
Rev ED report and Ct scan-reassuring  Suspect this is a tension headache that with other triggers transformed into a migraine w/o aura  Will stop sudafed and tylenol - this is creating rebound Use flexeril tid over the 3 d weekend with lots of fluids  Start to reduce caffeine gradually next wk  Control allergies better by adding steroid nasal spray  Use cold compress on side of head where headache is  Go bo ED if severe h/a (pt has remote hx of hemorrhagic brain event on anticoag) Will update next week and plan further action if not resolving >25 minutes spent in face to face time with patient, >50% spent in counselling or coordination of care

## 2015-07-01 NOTE — Assessment & Plan Note (Signed)
With congestion but not signs of bacterial infx  Worsening headache however  Will try flonase or nasacort ns daily to help with symptoms and update if not helpful

## 2015-07-06 ENCOUNTER — Ambulatory Visit (INDEPENDENT_AMBULATORY_CARE_PROVIDER_SITE_OTHER): Payer: Managed Care, Other (non HMO) | Admitting: General Practice

## 2015-07-06 DIAGNOSIS — D6851 Activated protein C resistance: Secondary | ICD-10-CM | POA: Diagnosis not present

## 2015-07-06 DIAGNOSIS — Z5181 Encounter for therapeutic drug level monitoring: Secondary | ICD-10-CM

## 2015-07-06 LAB — POCT INR: INR: 2.7

## 2015-07-06 NOTE — Progress Notes (Signed)
I have reviewed and agree with the plan. 

## 2015-07-06 NOTE — Progress Notes (Signed)
Pre visit review using our clinic review tool, if applicable. No additional management support is needed unless otherwise documented below in the visit note. 

## 2015-07-25 ENCOUNTER — Ambulatory Visit: Payer: Managed Care, Other (non HMO)

## 2015-09-19 ENCOUNTER — Other Ambulatory Visit: Payer: Self-pay | Admitting: Family Medicine

## 2015-09-19 NOTE — Telephone Encounter (Signed)
Please refill times one  

## 2015-09-19 NOTE — Telephone Encounter (Signed)
See mychart message, last OV was 06/29/15, last filled on 06/29/15 #60 with 0 refills, please advise

## 2015-09-20 MED ORDER — CYCLOBENZAPRINE HCL 5 MG PO TABS: 5.0000 mg | ORAL_TABLET | Freq: Three times a day (TID) | ORAL | 0 refills | Status: DC | PRN

## 2015-09-20 NOTE — Telephone Encounter (Signed)
done

## 2015-09-21 ENCOUNTER — Ambulatory Visit (INDEPENDENT_AMBULATORY_CARE_PROVIDER_SITE_OTHER): Payer: Managed Care, Other (non HMO) | Admitting: General Practice

## 2015-09-21 DIAGNOSIS — D6851 Activated protein C resistance: Secondary | ICD-10-CM | POA: Diagnosis not present

## 2015-09-21 DIAGNOSIS — Z5181 Encounter for therapeutic drug level monitoring: Secondary | ICD-10-CM | POA: Diagnosis not present

## 2015-09-21 LAB — POCT INR: INR: 2.5

## 2015-09-21 NOTE — Progress Notes (Signed)
I have reviewed and agree with the plan. 

## 2015-10-02 ENCOUNTER — Other Ambulatory Visit: Payer: Self-pay | Admitting: *Deleted

## 2015-10-02 NOTE — Telephone Encounter (Signed)
Please schedule f/u with lab prior (30 min visit) for fall and refill until then Thanks

## 2015-10-02 NOTE — Telephone Encounter (Signed)
Patient left a voicemail stating that she is changing her pharmacy to [facility] [address] Patient requests a refill on Paxil, Synthroid, Atorvastatin and Prilosec. Last office visit 06/29/15/acute No recent lab work Okay to refill?

## 2015-10-03 ENCOUNTER — Ambulatory Visit: Payer: Managed Care, Other (non HMO) | Admitting: Family Medicine

## 2015-10-03 MED ORDER — OMEPRAZOLE 20 MG PO CPDR: 20.0000 mg | DELAYED_RELEASE_CAPSULE | Freq: Two times a day (BID) | ORAL | 0 refills | Status: DC

## 2015-10-03 MED ORDER — ATORVASTATIN CALCIUM 20 MG PO TABS: 20.0000 mg | ORAL_TABLET | Freq: Every day | ORAL | 0 refills | Status: DC

## 2015-10-03 MED ORDER — PAROXETINE HCL 40 MG PO TABS: 40.0000 mg | ORAL_TABLET | Freq: Every morning | ORAL | 0 refills | Status: DC

## 2015-10-03 MED ORDER — LEVOTHYROXINE SODIUM 175 MCG PO TABS: 175.0000 ug | ORAL_TABLET | Freq: Every day | ORAL | 0 refills | Status: DC

## 2015-10-15 ENCOUNTER — Encounter: Payer: Self-pay | Admitting: Family Medicine

## 2015-10-15 ENCOUNTER — Ambulatory Visit (INDEPENDENT_AMBULATORY_CARE_PROVIDER_SITE_OTHER): Payer: Managed Care, Other (non HMO) | Admitting: Family Medicine

## 2015-10-15 ENCOUNTER — Ambulatory Visit (INDEPENDENT_AMBULATORY_CARE_PROVIDER_SITE_OTHER)
Admission: RE | Admit: 2015-10-15 | Discharge: 2015-10-15 | Disposition: A | Discharge status: Home / Self Care | Payer: Managed Care, Other (non HMO) | Source: Ambulatory Visit | Attending: Family Medicine | Admitting: Family Medicine

## 2015-10-15 ENCOUNTER — Telehealth: Payer: Self-pay | Admitting: Family Medicine

## 2015-10-15 VITALS — BP 124/86 | HR 71 | Temp 98.3°F | Ht 65.0 in | Wt 290.2 lb

## 2015-10-15 DIAGNOSIS — R112 Nausea with vomiting, unspecified: Secondary | ICD-10-CM | POA: Insufficient documentation

## 2015-10-15 DIAGNOSIS — G4452 New daily persistent headache (NDPH): Secondary | ICD-10-CM

## 2015-10-15 DIAGNOSIS — M542 Cervicalgia: Secondary | ICD-10-CM

## 2015-10-15 DIAGNOSIS — J309 Allergic rhinitis, unspecified: Secondary | ICD-10-CM

## 2015-10-15 DIAGNOSIS — I10 Essential (primary) hypertension: Secondary | ICD-10-CM

## 2015-10-15 MED ORDER — CYCLOBENZAPRINE HCL 5 MG PO TABS: 2.5000 mg | ORAL_TABLET | Freq: Three times a day (TID) | ORAL | 1 refills | Status: DC | PRN

## 2015-10-15 NOTE — Telephone Encounter (Signed)
Pt returned your call, please call cell thanks

## 2015-10-15 NOTE — Progress Notes (Signed)
Pre visit review using our clinic review tool, if applicable. No additional management support is needed unless otherwise documented below in the visit note. 

## 2015-10-15 NOTE — Patient Instructions (Addendum)
Xray of neck today  I wonder if physical therapy could help  Take the flexeril 5 mg - cut in 1/2 and for now take 1/2 pill three times daily (every 8 hours approx)  Drink fluids Use heat on your neck before and after work if helpful  Avoid caffeine  Stop at check out [facility]

## 2015-10-15 NOTE — Telephone Encounter (Signed)
I will route to [facility]  

## 2015-10-15 NOTE — Telephone Encounter (Signed)
-----   Message from Shon MilletShapale M [place], [place] sent at 10/15/2015  3:28 PM EDT ----- Pt notified of xray results and Dr. Royden Purlower's comments, pt agrees with PT, please put referral in and [facility]

## 2015-10-15 NOTE — Telephone Encounter (Signed)
Addressed through result notes  

## 2015-10-15 NOTE — Assessment & Plan Note (Signed)
Pt suspects this is related to her prior gastric sleeve procedure  Her surgeon is no longer there- but will ref her to the [facility] gen surg dept that will hopefully have her records

## 2015-10-15 NOTE — Assessment & Plan Note (Addendum)
Now bilateral and coming on in response to her neck pain and spasm  No neuro symptoms CS film today  inst to use 2.5 mg flexeril every 8 hours Heat prn  Fluids with less caffeine

## 2015-10-15 NOTE — Assessment & Plan Note (Signed)
bp is in good control  Doubt this contributes to headaches

## 2015-10-15 NOTE — Progress Notes (Signed)
Subjective:    Patient ID: [name], female    DOB: 1966/01/08, 50 y.o.   MRN: [id number]  HPI Here for several issues including headache neck and shoulder pain and ear fullness  Ears feels full and roaring sound - has calmed a little  Some allergy symptoms/post nasal drip    Caffeine intake  Allergy control - ok for the most part -taking flonase which is helpful   Has tried flexeril in the past for headache-it did help that and neck tightness Also takes tylenol prn  Does not make her sleepy     Past hx of ICH- had CT in may Does not feel like she did with ICH   BP Readings from Last 3 Encounters:  10/15/15 124/86  06/29/15 126/84  06/20/15 148/97    Headache will not go away  Has tried adjusting chair and work station  Neck is tense and tight  occ nausea (but this may be related to her prior gastric surgery) --- her surgeon was in [place] and is no longer around  The office is gone also   May get a massage today to help with her neck  Patient Active Problem List   Diagnosis Date Noted  . Nausea with vomiting 10/15/2015  . Allergic rhinitis 07/01/2015  . History of cerebral hemorrhage 07/01/2015  . Headache 06/29/2015  . Screening for osteoporosis 11/04/2013  . Headache(784.0) 10/25/2013  . Pain, eye, right 10/25/2013  . Encounter for therapeutic drug monitoring 03/25/2013  . CKD (chronic kidney disease) stage 2, GFR 60-89 ml/min 12/20/2012  . 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 Routine general medical examination at a health care facility 12/07/2012  . Steroid long-term use 12/07/2012  . History of HPV infection 12/07/2012  . Long term (current) use of anticoagulants 10/06/2012  . Chronic anticoagulation 04/22/2012  . S/P bariatric surgery 04/22/2012  . Factor V Leiden (HCC) 04/22/2012  . Dark urine 04/16/2012  . Urine abnormality 04/16/2012  . Neck pain 02/23/2012  . OSA on CPAP 11/20/2011  . Hyperglycemia 09/16/2010  . EDEMA 01/08/2010  .  PLEURAL EFFUSION 08/27/2009  . ENLARGEMENT OF LYMPH NODES 08/13/2009  . Hx of PE 05/24/2009  . Obesity 12/08/2007  . Hypothyroidism 06/09/2006  . HYPERCHOLESTEROLEMIA 06/09/2006  . ANXIETY 06/09/2006  . PANIC DISORDER 06/09/2006  . Essential hypertension 06/09/2006  . GLOMERULONEPHRITIS 06/09/2006  . Systemic lupus erythematosus (HCC) 06/09/2006  . DVT, HX OF 06/09/2006   Past Medical History:  Diagnosis Date  . Anxiety state, unspecified    panic attacks  . Arthritis    Lupus - hands/knees  . Edema   . 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 GERD (gastroesophageal reflux disease)    on prilosec r/t gastric sleeve surgery  . HLD (hyperlipidemia)   . Iron deficiency anemia    IV dextran [place]  . Nephritis and nephropathy, not specified as acute or chronic, with unspecified pathological lesion in kidney   . Nonspecific abnormal results of liver function study   . Obesity, unspecified   . Other pulmonary embolism and infarction   . Other specified acquired hypothyroidism   . Panic disorder without agoraphobia   . Personal history of venous thrombosis and embolism 2002   during pregnancy; ?factor 5 leiden (sees heme)  . Pleural effusion 2005   c/w lupus initial w/u; recurrent Right as pred tapered off  July 2011  . Pure hypercholesterolemia   . Sleep apnea   . 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 Unspecified essential hypertension   . Unspecified pleural effusion    Past Surgical History:  Procedure Laterality Date  . gastric sleeve  8/12   bariatric surgery Dr Adolphus Birchwood   . LAPAROSCOPIC TUBAL LIGATION  12/09/2010   Procedure: LAPAROSCOPIC TUBAL LIGATION;  Surgeon: Juluis Mire;  Location: [facility];  Service: Gynecology;  Laterality: Bilateral;  Attempted see Nursing note  . pleurocentesis  10/2004  . Pleuryx catheter  placement  9/11    VanTright----removed 9/11  . RENAL BIOPSY  09/24/2012  . svd     x 3  . THORACENTESIS  2010   with penumonia  . WISDOM TOOTH EXTRACTION     Social History  Substance Use Topics  . Smoking status: Former Smoker    Packs/day: 0.50    Years: 10.00    Types: Cigarettes    Quit date: 05/04/2009  . Smokeless tobacco: Never Used     Comment: Socially x10 years (1 pack/month)  . Alcohol use 0.0 oz/week     Comment: socially   Family History  Problem Relation Age of Onset  . Lupus Sister   . Diabetes      GM  . Hypertension Father   . Hyperlipidemia Father   . Leukemia Sister    Allergies  Allergen Reactions  . Lisinopril Cough  . Lovenox [Enoxaparin Sodium] Itching  . Moxifloxacin Other (See Comments)    REACTION: hallucinations  . Amoxicillin Other (See Comments)    unknown   Current Outpatient Prescriptions on File Prior to Visit  Medication Sig Dispense Refill  . atorvastatin (LIPITOR) 20 MG tablet Take 1 tablet (20 mg total) by mouth daily. 90 tablet 0  . BIOTIN PO Take 1 capsule by mouth daily.    . Cyanocobalamin (VITAMIN B-12 PO) Take 1 capsule by mouth daily.    . IRON PO Take 1 tablet by mouth daily.    Marland Kitchen labetalol (NORMODYNE) 200 MG tablet Take 1 and 1/2 tablets  by mouth twice a day 270 tablet 3  . levothyroxine (SYNTHROID, LEVOTHROID) 175 MCG tablet Take 1 tablet (175 mcg total) by mouth daily. 90 tablet 0  . Multiple Vitamin (MULTIVITAMIN) tablet Take 1 tablet by mouth daily.    Marland Kitchen omeprazole (PRILOSEC) 20 MG capsule Take 1 capsule (20 mg total) by mouth 2 (two) times daily before a meal. 180 capsule 0  . PARoxetine (PAXIL) 40 MG tablet Take 1 tablet (40 mg total) by mouth every morning. 90 tablet 0  . warfarin (COUMADIN) 7.5 MG tablet Take as directed by anticoagulation clinic 165 tablet 3   No current facility-administered medications on file prior to visit.      Review of Systems    Review of Systems  Constitutional: Negative for  fever, appetite change,  and unexpected weight change.  Eyes: Negative for pain and visual disturbance.  Respiratory: Negative for cough and shortness of breath.   Cardiovascular: Negative for cp or palpitations    Gastrointestinal: Negative for , diarrhea and constipation. pos for occ n/v Genitourinary: Negative for urgency and frequency.  Skin: Negative for pallor or rash   MSK pos for neck soreness/tight muscles  Neurological: Negative for weakness, light-headedness, numbness and pos for headaches.  Hematological: Negative for adenopathy. Does not bruise/bleed easily.  Psychiatric/Behavioral: Negative for dysphoric mood. The patient is  not nervous/anxious.      Objective:   Physical Exam  Constitutional: She appears well-developed and well-nourished. No distress.  obese and well appearing   HENT:  Head: Normocephalic and atraumatic.  Mouth/Throat: Oropharynx is clear and moist.  Nares are boggy TMs clear No sinus tenderness  Eyes: Conjunctivae and EOM are normal. Pupils are equal, round, and reactive to light.  Neck: Normal range of motion. Neck supple. No JVD present. Carotid bruit is not present. No thyromegaly present.  Cardiovascular: Normal rate, regular rhythm, normal heart sounds and intact distal pulses.  Exam reveals no gallop.   Pulmonary/Chest: Effort normal and breath sounds normal. No respiratory distress. She has no wheezes. She has no rales.  No crackles  Abdominal: Soft. Bowel sounds are normal. She exhibits no distension, no abdominal bruit and no mass. There is no tenderness.  Musculoskeletal: She exhibits no edema.  Neck muscles are tender posteriorly  Nl rom of neck with pain on full flexion and bilat tilt  Lymphadenopathy:    She has no cervical adenopathy.  Neurological: She is alert. She has normal reflexes. She displays no atrophy and no tremor. No cranial nerve deficit or sensory deficit. She exhibits normal muscle tone. Coordination and gait normal.    Skin: Skin is warm and dry. No rash noted. No erythema. No pallor.  Psychiatric: She has a normal mood and affect.          Assessment & Plan:   Problem List Items Addressed This Visit      Cardiovascular and Mediastinum   Essential hypertension    bp is in good control  Doubt this contributes to headaches         Respiratory   Allergic rhinitis    Reassuring exam  Disc avoidance of allergens and use of flonase ns for ear symptoms and congestion        Digestive   Nausea with vomiting    Pt suspects this is related to her prior gastric sleeve procedure  Her surgeon is no longer there- but will ref her to the [place] gen surg dept that will hopefully have her records      Relevant Orders   Ambulatory referral to General Surgery     Other   Neck pain - Primary    Ongoing - worse at work and better on weekends-this seems to be causing her headaches  (despite changing some ergonomics at her workplace) Disc use of heat for spasm CS films today  Adv to try flexeril 2.5 mg tid  Tylenol prn  Considering PT depending on xr reading      Relevant Orders   DG Cervical Spine Complete (Completed)   Headache    Now bilateral and coming on in response to her neck pain and spasm  No neuro symptoms CS film today  inst to use 2.5 mg flexeril every 8 hours Heat prn  Fluids with less caffeine        Relevant Medications   cyclobenzaprine (FLEXERIL) 5 MG tablet    Other Visit Diagnoses   None.

## 2015-10-15 NOTE — Assessment & Plan Note (Signed)
Reassuring exam  Disc avoidance of allergens and use of flonase ns for ear symptoms and congestion

## 2015-10-15 NOTE — Assessment & Plan Note (Signed)
Ongoing - worse at work and better on weekends-this seems to be causing her headaches  (despite changing some ergonomics at her workplace) Disc use of heat for spasm CS films today  Adv to try flexeril 2.5 mg tid  Tylenol prn  Considering PT depending on xr reading

## 2015-11-02 ENCOUNTER — Ambulatory Visit (INDEPENDENT_AMBULATORY_CARE_PROVIDER_SITE_OTHER): Payer: Managed Care, Other (non HMO) | Admitting: General Practice

## 2015-11-02 DIAGNOSIS — Z5181 Encounter for therapeutic drug level monitoring: Secondary | ICD-10-CM | POA: Diagnosis not present

## 2015-11-02 DIAGNOSIS — D6851 Activated protein C resistance: Secondary | ICD-10-CM | POA: Diagnosis not present

## 2015-11-02 LAB — POCT INR: INR: 2.9

## 2015-11-02 NOTE — Progress Notes (Signed)
I have reviewed and agree with the plan. 

## 2015-11-11 ENCOUNTER — Telehealth: Payer: Self-pay | Admitting: Family Medicine

## 2015-11-11 DIAGNOSIS — E038 Other specified hypothyroidism: Secondary | ICD-10-CM

## 2015-11-11 DIAGNOSIS — R739 Hyperglycemia, unspecified: Secondary | ICD-10-CM

## 2015-11-11 DIAGNOSIS — I10 Essential (primary) hypertension: Secondary | ICD-10-CM

## 2015-11-11 NOTE — Telephone Encounter (Signed)
-----   Message from Baldomero LamyNatasha C Chavers sent at 11/09/2015  1:36 PM EDT ----- Regarding: f/u labs Mon 10/9, need orders. Thanks! :-) Please order  future f/u labs for pt's upcoming lab appt. Thanks Rodney Boozeasha

## 2015-11-12 ENCOUNTER — Other Ambulatory Visit: Payer: Managed Care, Other (non HMO)

## 2015-11-14 ENCOUNTER — Ambulatory Visit (INDEPENDENT_AMBULATORY_CARE_PROVIDER_SITE_OTHER): Payer: Managed Care, Other (non HMO) | Admitting: Family Medicine

## 2015-11-14 VITALS — BP 118/78 | HR 78 | Temp 98.1°F | Wt 287.8 lb

## 2015-11-14 DIAGNOSIS — R739 Hyperglycemia, unspecified: Secondary | ICD-10-CM | POA: Diagnosis not present

## 2015-11-14 DIAGNOSIS — I10 Essential (primary) hypertension: Secondary | ICD-10-CM

## 2015-11-14 DIAGNOSIS — M542 Cervicalgia: Secondary | ICD-10-CM | POA: Diagnosis not present

## 2015-11-14 DIAGNOSIS — R252 Cramp and spasm: Secondary | ICD-10-CM

## 2015-11-14 DIAGNOSIS — Z9884 Bariatric surgery status: Secondary | ICD-10-CM

## 2015-11-14 DIAGNOSIS — E7800 Pure hypercholesterolemia, unspecified: Secondary | ICD-10-CM

## 2015-11-14 DIAGNOSIS — R110 Nausea: Secondary | ICD-10-CM

## 2015-11-14 DIAGNOSIS — D6851 Activated protein C resistance: Secondary | ICD-10-CM

## 2015-11-14 DIAGNOSIS — M3214 Glomerular disease in systemic lupus erythematosus: Secondary | ICD-10-CM

## 2015-11-14 DIAGNOSIS — G4489 Other headache syndrome: Secondary | ICD-10-CM

## 2015-11-14 NOTE — Patient Instructions (Signed)
Labs today  Keep hydrated Flexeril as needed -and if worse headache - alert me  We will consider neuro referral next  Stop at check out for referral to gen surgery   Keep me posted regarding viral symptoms

## 2015-11-14 NOTE — Progress Notes (Signed)
Subjective:    Patient ID: [name], female    DOB: 08/04/1965, 50 y.o.   MRN: [id number]  HPI Here for chronic medical conditions and neck pain   Sat head and neck started hurting (driving , no hx of trauma), and did a little walking and then it started hurting  (pain from R trap area and then up to back of head - eventually whole head and both sides)  Random positions were better or worse  Some throbbing  Took flexeril and went to sleep Woke up Sunday am -groggy but headache was better  Very nauseated on Sunday night (unsure if related to her gastric sleeve) -no vomiting Cough prod of clear mucous on Monday and ribs were sore =no neck or head pain however (felt lousy and thinks she had a fever) -it broke late monday Not much appetite  A little diarrhea tuesday  Now she feels tired/wiped out   Had CS xray in sept  DG Cervical Spine Complete (Accession [id number]) (Order [id number])  Imaging  Date: 10/15/2015 [facility] Released By: Alvina Chouerri J Walsh Authorizing: Judy PimpleMarne A Candela Krul, MD  Exam Information   Status Exam Begun  Exam Ended   Final [99] 10/15/2015 11:25 AM 10/15/2015 11:32 AM  PACS Images   Show images for DG Cervical Spine Complete  Study Result   CLINICAL DATA:  Ongoing neck pain, headaches, muscle tightness.  EXAM: CERVICAL SPINE - COMPLETE 4+ VIEW  COMPARISON:  None.  FINDINGS: There is no evidence of cervical spine fracture or prevertebral soft tissue swelling. Alignment is normal. No other significant bone abnormalities are identified.  IMPRESSION: Negative cervical spine radiographs.   Last CT was in May Remote hx of ICH in the past    Wt Readings from Last 3 Encounters:  11/14/15 287 lb 12.8 oz (130.5 kg)  10/15/15 290 lb 4 oz (131.7 kg)  06/29/15 295 lb (133.8 kg)  bmi is 47  BP Readings from Last 3 Encounters:  11/14/15 118/78  10/15/15 124/86  06/29/15 126/84   Lab Results  Component Value Date     INR 2.9 11/02/2015   INR 2.5 09/21/2015   INR 2.7 07/06/2015   PROTIME 15.6 (H) 12/06/2012   PROTIME 19.2 (H) 06/03/2012   Did check in with Dr Kellie Simmeringruslow regarding SLE -stable Has renal f/u upcoming  Wants her to go back on Plaquenil   Patient Active Problem List   Diagnosis Date Noted  . Muscle cramps 11/14/2015  . Nausea with vomiting 10/15/2015  . Allergic rhinitis 07/01/2015  . History of cerebral hemorrhage 07/01/2015  . Headache 06/29/2015  . Screening for osteoporosis 11/04/2013  . Headache(784.0) 10/25/2013  . Pain, eye, right 10/25/2013  . Encounter for therapeutic drug monitoring 03/25/2013  . CKD (chronic kidney disease) stage 2, GFR 60-89 ml/min 12/20/2012  . 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 Routine general medical examination at a health care facility 12/07/2012  . Steroid long-term use 12/07/2012  . History of HPV infection 12/07/2012  . Long term (current) use of anticoagulants 10/06/2012  . Chronic anticoagulation 04/22/2012  . S/P bariatric surgery 04/22/2012  . Factor V Leiden (HCC) 04/22/2012  . Urine abnormality 04/16/2012  . Neck pain 02/23/2012  . OSA on CPAP 11/20/2011  . Hyperglycemia 09/16/2010  . EDEMA 01/08/2010  . PLEURAL EFFUSION 08/27/2009  . ENLARGEMENT OF LYMPH NODES 08/13/2009  . Hx of PE 05/24/2009  . Obesity 12/08/2007  . Hypothyroidism 06/09/2006  . HYPERCHOLESTEROLEMIA 06/09/2006  .  ANXIETY 06/09/2006  . PANIC DISORDER 06/09/2006  . Essential hypertension 06/09/2006  . GLOMERULONEPHRITIS 06/09/2006  . Systemic lupus erythematosus (HCC) 06/09/2006  . DVT, HX OF 06/09/2006   Past Medical History:  Diagnosis Date  . Anxiety state, unspecified    panic attacks  . Arthritis    Lupus - hands/knees  . Edema   . 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 (HCC)    Loculated-chronic on Left-VanTright; s/p VATS 5/10  . 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 GERD (gastroesophageal reflux disease)    on prilosec r/t  gastric sleeve surgery  . HLD (hyperlipidemia)   . Iron deficiency anemia    IV dextran Coladonato  . Nephritis and nephropathy, not specified as acute or chronic, with unspecified pathological lesion in kidney   . Nonspecific abnormal results of liver function study   . Obesity, unspecified   . Other pulmonary embolism and infarction (HCC)   . Other specified acquired hypothyroidism   . Panic disorder without agoraphobia   . Personal history of venous thrombosis and embolism 2002   during pregnancy; ?factor 5 leiden (sees heme)  . Pleural effusion 2005   c/w lupus initial w/u; recurrent Right as pred tapered off July 2011  . Pure hypercholesterolemia   . Sleep apnea   . 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 Unspecified essential hypertension   . Unspecified pleural effusion    Past Surgical History:  Procedure Laterality Date  . gastric sleeve  8/12   bariatric surgery Dr Adolphus Birchwood   . LAPAROSCOPIC TUBAL LIGATION  12/09/2010   Procedure: LAPAROSCOPIC TUBAL LIGATION;  Surgeon: Juluis Mire;  Location: [facility];  Service: Gynecology;  Laterality: Bilateral;  Attempted see Nursing note  . pleurocentesis  10/2004  . Pleuryx catheter placement  9/11    VanTright----removed 9/11  . RENAL BIOPSY  09/24/2012  . svd     x 3  . THORACENTESIS  2010   with penumonia  . WISDOM TOOTH EXTRACTION     Social History  Substance Use Topics  . Smoking status: Former Smoker    Packs/day: 0.50    Years: 10.00    Types: Cigarettes    Quit date: 05/04/2009  . Smokeless tobacco: Never Used     Comment: Socially x10 years (1 pack/month)  . Alcohol use 0.0 oz/week     Comment: socially   Family History  Problem Relation Age of Onset  . Lupus Sister   . Diabetes      GM  . Hypertension Father   . Hyperlipidemia Father   . Leukemia Sister    Allergies  Allergen Reactions  . Lisinopril Cough  . Lovenox [Enoxaparin Sodium] Itching  .  Moxifloxacin Other (See Comments)    REACTION: hallucinations  . Amoxicillin Other (See Comments)    unknown   Current Outpatient Prescriptions on File Prior to Visit  Medication Sig Dispense Refill  . atorvastatin (LIPITOR) 20 MG tablet Take 1 tablet (20 mg total) by mouth daily. 90 tablet 0  . BIOTIN PO Take 1 capsule by mouth daily.    . Cyanocobalamin (VITAMIN B-12 PO) Take 1 capsule by mouth daily.    . cyclobenzaprine (FLEXERIL) 5 MG tablet Take 0.5-1 tablets (2.5-5 mg total) by mouth 3 (three) times daily as needed for muscle spasms (headache). Caution of sedation 90 tablet 1  . IRON PO Take 1 tablet by mouth daily.    Marland Kitchen  labetalol (NORMODYNE) 200 MG tablet Take 1 and 1/2 tablets  by mouth twice a day 270 tablet 3  . levothyroxine (SYNTHROID, LEVOTHROID) 175 MCG tablet Take 1 tablet (175 mcg total) by mouth daily. 90 tablet 0  . Multiple Vitamin (MULTIVITAMIN) tablet Take 1 tablet by mouth daily.    Marland Kitchen omeprazole (PRILOSEC) 20 MG capsule Take 1 capsule (20 mg total) by mouth 2 (two) times daily before a meal. 180 capsule 0  . PARoxetine (PAXIL) 40 MG tablet Take 1 tablet (40 mg total) by mouth every morning. 90 tablet 0  . warfarin (COUMADIN) 7.5 MG tablet Take as directed by anticoagulation clinic 165 tablet 3   No current facility-administered medications on file prior to visit.     Review of Systems    Review of Systems  Constitutional: Negative for fever, appetite change, fatigue and unexpected weight change.  Eyes: Negative for pain and visual disturbance.  Respiratory: Negative for cough and shortness of breath.   Cardiovascular: Negative for cp or palpitations    Gastrointestinal: Negative for nausea, diarrhea and constipation.  Genitourinary: Negative for urgency and frequency.  Skin: Negative for pallor or rash   Neurological: Negative for weakness, light-headedness, numbness and pos for headaches.  Hematological: Negative for adenopathy. Does not bruise/bleed easily.    Psychiatric/Behavioral: Negative for dysphoric mood. The patient is not nervous/anxious.      Objective:   Physical Exam  Constitutional: She appears well-developed and well-nourished. No distress.  obese and well appearing   HENT:  Head: Normocephalic and atraumatic.  Mouth/Throat: Oropharynx is clear and moist.  Eyes: Conjunctivae and EOM are normal. Pupils are equal, round, and reactive to light.  Neck: Normal range of motion. Neck supple. No JVD present. Carotid bruit is not present. No thyromegaly present.  No neck tenderness or crepitus  Nl rom  No spasm noted   Cardiovascular: Normal rate, regular rhythm, normal heart sounds and intact distal pulses.  Exam reveals no gallop.   Pulmonary/Chest: Effort normal and breath sounds normal. No respiratory distress. She has no wheezes. She has no rales.  No crackles  Abdominal: Soft. Bowel sounds are normal. She exhibits no distension, no abdominal bruit and no mass. There is no tenderness.  Musculoskeletal: She exhibits no edema or tenderness.  Baseline firm non pitting lymphedema in legs w/scarring - much improved fro  Lymphadenopathy:    She has no cervical adenopathy.  Neurological: She is alert. She has normal reflexes. She displays no atrophy. No cranial nerve deficit or sensory deficit. She exhibits normal muscle tone. She displays a negative Romberg sign. Coordination and gait normal.  No cerebellar signs  Skin: Skin is warm and dry. No rash noted.  Psychiatric: She has a normal mood and affect.          Assessment & Plan:   Problem List Items Addressed This Visit      Cardiovascular and Mediastinum   Essential hypertension - Primary    bp in fair control at this time  BP Readings from Last 1 Encounters:  11/14/15 118/78   No changes needed Disc lifstyle change with low sodium diet and exercise  Labs today       Relevant Orders   CBC with Differential/Platelet (Completed)   Comprehensive metabolic panel  (Completed)   TSH (Completed)   Lipid panel (Completed)     Genitourinary   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HCC)    She is back to rheumatology and will be starting plaquenil  Renal status is  monitored carefully        Hematopoietic and Hemostatic   Factor V Leiden (HCC)    No new developments/clotting or bleeding  On warfarin Lab Results  Component Value Date   INR 2.9 11/02/2015   INR 2.5 09/21/2015   INR 2.7 07/06/2015   PROTIME 15.6 (H) 12/06/2012   PROTIME 19.2 (H) 06/03/2012           Other   Headache    Last HA resolved with flexeril  Some features of migraine  Disc poss triggers  Nl exam today  Past hx of ICH - pt states headache did not feel like this at all  She wishes to see neuro after her surgical visit -will call for referral  Rev some lifestyle changes to prevent headache        HYPERCHOLESTEROLEMIA    Lab today  Diet and atorvastain  Rev low sat fat diet       Relevant Orders   Lipid panel (Completed)   Hyperglycemia    A1C today  Watching diet carefully  Enc exercise as tolerated      Relevant Orders   Hemoglobin A1c (Completed)   Muscle cramps   Neck pain    Most recent episode followed by HA and relieved by flexeril  Disc neck stretching  Nl exam today Continue to follow        S/P bariatric surgery    More nausea lately with gas/belching and occ abd pain  Desires surgical ref  Does not want to return to [place]  Will ref to [facility] Has done very well with the surgery and wt loss overall      Relevant Orders   VITAMIN D 25 Hydroxy (Vit-D Deficiency, Fractures) (Completed)   Vitamin B12 (Completed)   Ambulatory referral to General Surgery    Other Visit Diagnoses    Nausea without vomiting       Relevant Orders   Ambulatory referral to General Surgery

## 2015-11-14 NOTE — Progress Notes (Signed)
Pre visit review using our clinic review tool, if applicable. No additional management support is needed unless otherwise documented below in the visit note. 

## 2015-11-15 ENCOUNTER — Encounter: Payer: Self-pay | Admitting: Family Medicine

## 2015-11-15 DIAGNOSIS — E538 Deficiency of other specified B group vitamins: Secondary | ICD-10-CM | POA: Insufficient documentation

## 2015-11-15 LAB — COMPREHENSIVE METABOLIC PANEL
ALBUMIN: 3.7 g/dL (ref 3.5–5.2)
ALT: 22 U/L (ref 0–35)
AST: 22 U/L (ref 0–37)
Alkaline Phosphatase: 92 U/L (ref 39–117)
BILIRUBIN TOTAL: 0.3 mg/dL (ref 0.2–1.2)
BUN: 17 mg/dL (ref 6–23)
CHLORIDE: 103 meq/L (ref 96–112)
CO2: 30 meq/L (ref 19–32)
CREATININE: 1.11 mg/dL (ref 0.40–1.20)
Calcium: 9.4 mg/dL (ref 8.4–10.5)
GFR: 55.46 mL/min — ABNORMAL LOW (ref >60.00)
Glucose, Bld: 102 mg/dL — ABNORMAL HIGH (ref 70–99)
Potassium: 4.6 mEq/L (ref 3.5–5.1)
SODIUM: 139 meq/L (ref 135–145)
Total Protein: 7.7 g/dL (ref 6.0–8.3)

## 2015-11-15 LAB — LIPID PANEL
CHOL/HDL RATIO: 5
Cholesterol: 201 mg/dL — ABNORMAL HIGH (ref 0–200)
HDL: 43.5 mg/dL (ref >39.00)
LDL Cholesterol: 137 mg/dL — ABNORMAL HIGH (ref 0–99)
NONHDL: 157.58
Triglycerides: 105 mg/dL (ref 0.0–149.0)
VLDL: 21 mg/dL (ref 0.0–40.0)

## 2015-11-15 LAB — CBC WITH DIFFERENTIAL/PLATELET
BASOS ABS: 0 10*3/uL (ref 0.0–0.1)
BASOS PCT: 1.1 % (ref 0.0–3.0)
EOS ABS: 0.2 10*3/uL (ref 0.0–0.7)
Eosinophils Relative: 5.6 % — ABNORMAL HIGH (ref 0.0–5.0)
HCT: 37.2 % (ref 36.0–46.0)
HEMOGLOBIN: 12.3 g/dL (ref 12.0–15.0)
LYMPHS PCT: 28.5 % (ref 12.0–46.0)
Lymphs Abs: 1.1 10*3/uL (ref 0.7–4.0)
MCHC: 33 g/dL (ref 30.0–36.0)
MCV: 82.8 fl (ref 78.0–100.0)
MONO ABS: 0.3 10*3/uL (ref 0.1–1.0)
Monocytes Relative: 7.1 % (ref 3.0–12.0)
Neutro Abs: 2.3 10*3/uL (ref 1.4–7.7)
Neutrophils Relative %: 57.7 % (ref 43.0–77.0)
PLATELETS: 224 10*3/uL (ref 150.0–400.0)
RBC: 4.49 Mil/uL (ref 3.87–5.11)
RDW: 14.9 % (ref 11.5–15.5)
WBC: 4 10*3/uL (ref 4.0–10.5)

## 2015-11-15 LAB — TSH: TSH: 0.82 u[IU]/mL (ref 0.35–4.50)

## 2015-11-15 LAB — VITAMIN D 25 HYDROXY (VIT D DEFICIENCY, FRACTURES): VITD: 22.34 ng/mL — ABNORMAL LOW (ref 30.00–100.00)

## 2015-11-15 LAB — VITAMIN B12: Vitamin B-12: 217 pg/mL (ref 211–911)

## 2015-11-15 LAB — HEMOGLOBIN A1C: Hgb A1c MFr Bld: 5.8 % (ref 4.6–6.5)

## 2015-11-15 NOTE — Assessment & Plan Note (Signed)
Last HA resolved with flexeril  Some features of migraine  Disc poss triggers  Nl exam today  Past hx of ICH - pt states headache did not feel like this at all  She wishes to see neuro after her surgical visit -will call for referral  Rev some lifestyle changes to prevent headache

## 2015-11-15 NOTE — Assessment & Plan Note (Signed)
Most recent episode followed by HA and relieved by flexeril  Disc neck stretching  Nl exam today Continue to follow

## 2015-11-15 NOTE — Assessment & Plan Note (Signed)
No new developments/clotting or bleeding  On warfarin Lab Results  Component Value Date   INR 2.9 11/02/2015   INR 2.5 09/21/2015   INR 2.7 07/06/2015   PROTIME 15.6 (H) 12/06/2012   PROTIME 19.2 (H) 06/03/2012

## 2015-11-15 NOTE — Assessment & Plan Note (Signed)
Lab today  Diet and atorvastain  Rev low sat fat diet

## 2015-11-15 NOTE — Assessment & Plan Note (Signed)
bp in fair control at this time  BP Readings from Last 1 Encounters:  11/14/15 118/78   No changes needed Disc lifstyle change with low sodium diet and exercise  Labs today

## 2015-11-15 NOTE — Assessment & Plan Note (Signed)
More nausea lately with gas/belching and occ abd pain  Desires surgical ref  Does not want to return to [place]  Will ref to [facility] Has done very well with the surgery and wt loss overall

## 2015-11-15 NOTE — Assessment & Plan Note (Signed)
She is back to rheumatology and will be starting plaquenil  Renal status is monitored carefully

## 2015-11-15 NOTE — Assessment & Plan Note (Signed)
A1C today  Watching diet carefully  Enc exercise as tolerated

## 2015-11-19 ENCOUNTER — Ambulatory Visit: Payer: Managed Care, Other (non HMO) | Attending: Family Medicine

## 2015-11-30 ENCOUNTER — Ambulatory Visit (INDEPENDENT_AMBULATORY_CARE_PROVIDER_SITE_OTHER): Payer: Managed Care, Other (non HMO) | Admitting: General Practice

## 2015-11-30 DIAGNOSIS — Z5181 Encounter for therapeutic drug level monitoring: Secondary | ICD-10-CM | POA: Diagnosis not present

## 2015-11-30 DIAGNOSIS — D6851 Activated protein C resistance: Secondary | ICD-10-CM

## 2015-11-30 LAB — POCT INR: INR: 2.9

## 2015-11-30 NOTE — Patient Instructions (Signed)
Pre visit review using our clinic review tool, if applicable. No additional management support is needed unless otherwise documented below in the visit note. 

## 2015-11-30 NOTE — Progress Notes (Signed)
I have reviewed and agree with the plan. 

## 2015-12-06 ENCOUNTER — Ambulatory Visit (INDEPENDENT_AMBULATORY_CARE_PROVIDER_SITE_OTHER): Payer: Managed Care, Other (non HMO)

## 2015-12-06 DIAGNOSIS — E538 Deficiency of other specified B group vitamins: Secondary | ICD-10-CM | POA: Diagnosis not present

## 2015-12-06 MED ORDER — CYANOCOBALAMIN 1000 MCG/ML IJ SOLN
1000.0000 ug | Freq: Once | INTRAMUSCULAR | Status: AC
  Administered 2015-12-06: 1000 ug via INTRAMUSCULAR

## 2015-12-07 ENCOUNTER — Ambulatory Visit: Payer: Managed Care, Other (non HMO) | Attending: Family Medicine | Admitting: Physical Therapy

## 2015-12-07 DIAGNOSIS — R293 Abnormal posture: Secondary | ICD-10-CM | POA: Diagnosis present

## 2015-12-07 DIAGNOSIS — M542 Cervicalgia: Secondary | ICD-10-CM | POA: Insufficient documentation

## 2015-12-07 NOTE — Patient Instructions (Signed)
Axial Extension (Chin Tuck)    Pull chin in and lengthen back of neck. Hold __3__ seconds while counting out loud. Repeat _15___ times. Do _3___ sessions per day.   Scapular Retraction (Standing)    With arms at sides, pinch shoulder blades together. Repeat __15__ times per set. . Do __3__ sessions per day.   Ear / Shoulder Stretch    Exhaling, move left ear toward left shoulder. Hold position for _30__ seconds. Inhaling, bring head back to center. Repeat to other side. Repeat sequence _3__ times. Do _2__ times per day.

## 2015-12-07 NOTE — Therapy (Signed)
[facility] [address]  [address] [place], [place], [address] Phone: [phone]   Fax:  [phone]  Physical Therapy Evaluation  Patient Details  Name: [name] MRN: [id number] Date of Birth: 05/15/1965 Referring Provider: Dr. Audrie Gallus Tower  Encounter Date: 12/07/2015      PT End of Session - 12/07/15 1017    Visit Number 1   Number of Visits 12   Date for PT Re-Evaluation 01/18/16   PT Start Time 0915   PT Stop Time 1003   PT Time Calculation (min) 48 min   Activity Tolerance Patient tolerated treatment well;No increased pain   Behavior During Therapy WFL for tasks assessed/performed      Past Medical History:  Diagnosis Date  . Anxiety state, unspecified    panic attacks  . Arthritis    Lupus - hands/knees  . Edema   . 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 (HCC)    Loculated-chronic on Left-VanTright; s/p VATS 5/10  . 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 GERD (gastroesophageal reflux disease)    on prilosec r/t gastric sleeve surgery  . HLD (hyperlipidemia)   . Iron deficiency anemia    IV dextran [place]on[place]  . Nephritis and nephropathy, not specified as acute or chronic, with unspecified pathological lesion in kidney   . Nonspecific abnormal results of liver function study   . Obesity, unspecified   . Other pulmonary embolism and infarction (HCC)   . Other specified acquired hypothyroidism   . Panic disorder without agoraphobia   . Personal history of venous thrombosis and embolism 2002   during pregnancy; ?factor 5 leiden (sees heme)  . Pleural effusion 2005   c/w lupus initial w/u; recurrent Right as pred tapered off July 2011  . Pure hypercholesterolemia   . Sleep apnea   . 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 Unspecified essential hypertension   . Unspecified pleural effusion     Past Surgical History:  Procedure Laterality Date   . gastric sleeve  8/12   bariatric surgery Dr Adolphus Birchwood   . LAPAROSCOPIC TUBAL LIGATION  12/09/2010   Procedure: LAPAROSCOPIC TUBAL LIGATION;  Surgeon: Juluis Mire;  Location: [facility];  Service: Gynecology;  Laterality: Bilateral;  Attempted see Nursing note  . pleurocentesis  10/2004  . Pleuryx catheter placement  9/11    VanTright----removed 9/11  . RENAL BIOPSY  09/24/2012  . svd     x 3  . THORACENTESIS  2010   with penumonia  . WISDOM TOOTH EXTRACTION      There were no vitals filed for this visit.       Subjective Assessment - 12/07/15 0916    Subjective Patient reporting headaches for approx the last 6 months. Feels like they are "routine" and seem to be associated with tightness in neck/posterior head. Patient reporitng multiple headaches in a single day. Currently taking flexeril and tylenol to relieve pain. Patient has not noticed a pattern to the headaches, just seem to be daily. Has tried different desk ergonomics with no relief. Has tried massage therapy with little relief. Patient reporting reducing caffeine intake and increasing water intake, but nothing seems to help. No pain currenlty, max pain of 8/10, average daily pain 4-5/10.    Pertinent History Lupus   Diagnostic tests xray and MRI performed with no diagnostic findings   Patient Stated Goals improve  headache symptoms   Currently in Pain? No/denies  Patient reporting this is atypical with usual daily headaches;   Pain Location Head   Pain Orientation Posterior   Pain Descriptors / Indicators Tightness;Headache;Shooting   Pain Type Acute pain;Chronic pain   Pain Onset More than a month ago   Pain Frequency Intermittent   Aggravating Factors  looking at computer   Pain Relieving Factors meds, sleeping, dark room            [facility] - 12/07/15 0926      Assessment   Medical Diagnosis Neck pain   Referring Provider Dr. Audrie GallusMarne A Tower   Onset Date/Surgical Date --  6 months ago   Hand Dominance  Right   Next MD Visit prn   Prior Therapy no     Precautions   Precautions None     Balance Screen   Has the patient fallen in the past 6 months No   Has the patient had a decrease in activity level because of a fear of falling?  No   Is the patient reluctant to leave their home because of a fear of falling?  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, computer, typing, phone   Leisure has 49 y/o daughter     Cognition   Overall Cognitive Status Within Functional Limits for tasks assessed     Observation/Other Assessments   Focus on Therapeutic Outcomes (FOTO)  65 (35% limited, predicted 30% limited)     ROM / Strength   AROM / PROM / Strength AROM;Strength;PROM     AROM   AROM Assessment Site Cervical   Cervical Flexion WNL   Cervical - Right Side Bend 25% limited   Cervical - Left Side Bend 25% limited   Cervical - Right Rotation WNL   Cervical - Left Rotation WNL     PROM   Overall PROM Comments PROM equal to that of AROM with continued muscle tightness with R and L side bend; some tightness noted with C1-2 cervical flexion-rotation   PROM Assessment Site Cervical     Strength   Strength Assessment Site Shoulder;Elbow   Right/Left Shoulder Right;Left   Right Shoulder Flexion 4/5   Right Shoulder ABduction 4/5   Right Shoulder Internal Rotation 4-/5   Right Shoulder External Rotation 4-/5   Left Shoulder Flexion 4/5   Left Shoulder ABduction 4/5   Left Shoulder Internal Rotation 4-/5   Left Shoulder External Rotation 4-/5   Right/Left Elbow Right;Left   Right Elbow Flexion 5/5   Right Elbow Extension 5/5   Left Elbow Flexion 5/5   Left Elbow Extension 5/5     Flexibility   Soft Tissue Assessment /Muscle Length --     Palpation   Palpation comment Increased tissue tightness thought B UT, B levator scap, B pec region, as well as suboccipital mm.                    [facility] Treatment/Exercise - 12/07/15 1009      Exercises   Exercises Neck;Shoulder     Neck Exercises: Supine   Neck Retraction Limitations chin tucks; attmepted in sitting with poor form, regressed to supine for correct form; HEP initiation with pt instructed to progress to seated position     Shoulder Exercises: Seated   Retraction Both;15 reps   Retraction Limitations HEP initiation  Manual Therapy   Manual Therapy Soft tissue mobilization   Soft tissue mobilization STM to cervical paraspinals, B UT and B levator scap with patient in supine; suboccipital release 3 x 1 minute each with patient initally reporitng reprodution of symptoms with symptoms resolving at end of treatment.      Neck Exercises: Stretches   Upper Trapezius Stretch 3 reps;30 seconds   Upper Trapezius Stretch Limitations HEP initiation                PT Education - 12/07/15 1016    Education provided Yes   Education Details exam findings, POC, HEP initiation, possible dry needling in the future   Person(s) Educated Patient   Methods Explanation;Demonstration;Handout   Comprehension Verbalized understanding;Returned demonstration          PT Short Term Goals - 12/07/15 1029      PT SHORT TERM GOAL #1   Title Patient to be independent with initial HEP (12/21/15)   Time 2   Period Weeks   Status New           PT Long Term Goals - 12/07/15 1030      PT LONG TERM GOAL #1   Title Patient to be independent with advanced HEP (01/18/16)   Time 6   Period Weeks   Status New     PT LONG TERM GOAL #2   Title Patient to demonstrate good postural awareness with ability to correct independently to reduce stress on cervical/shoulder musculature for reduced headache symptoms (01/18/16)   Time 6   Period Weeks   Status New     PT LONG TERM GOAL #3   Title Patient to report headache pain <2/10 for greater than 2 weeks (01/18/16)   Time 6   Period Weeks    Status New               Plan - 12/07/15 1019    Clinical Impression Statement Patient is a 50 y/o F presenting to OPPT today with primary complaints of headaches and neck pain limiting daily function. Patient today demonstrating poor posture with forward head and rounded shoulders, increased tissue tension noted throughout B UT, cervical paraspinals, B levator scap increasing overall stress placed on cervical musculature likely leading to patient reports of neck pain and headaches. Patient today repsonding well to STM to B UT and cervical paraspinals along with suboccipital release with pt reporitng reduced tightness. Patient to benefit from skilled PT intervention to address the above listed deficits to allow patinet to return to work and social activities with reduced headaches.    Rehab Potential Good   PT Frequency 2x / week  may decrease to 1x/week depending on pt progress.    PT Duration 6 weeks   PT Treatment/Interventions ADLs/Self Care Home Management;Cryotherapy;Electrical Stimulation;Moist Heat;Traction;Ultrasound;Therapeutic exercise;Therapeutic activities;Patient/family education;Manual techniques;Passive range of motion;Taping;Dry needling   PT Next Visit Plan STM, progress postural exercise   Consulted and Agree with Plan of Care Patient      Patient will benefit from skilled therapeutic intervention in order to improve the following deficits and impairments:  Decreased activity tolerance, Decreased range of motion, Decreased strength, Pain, Increased muscle spasms  Visit Diagnosis: Cervicalgia - Plan: PT plan of care cert/re-cert  Abnormal posture - Plan: PT plan of care cert/re-cert     Problem List Patient Active Problem List   Diagnosis Date Noted  . Vitamin B12 deficiency 11/15/2015  . Muscle cramps 11/14/2015  . Nausea with vomiting 10/15/2015  .  Allergic rhinitis 07/01/2015  . History of cerebral hemorrhage 07/01/2015  . Headache 06/29/2015  .  Screening for osteoporosis 11/04/2013  . Headache(784.0) 10/25/2013  . Pain, eye, right 10/25/2013  . Encounter for therapeutic drug monitoring 03/25/2013  . CKD (chronic kidney disease) stage 2, GFR 60-89 ml/min 12/20/2012  . 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 Routine general medical examination at a health care facility 12/07/2012  . Steroid long-term use 12/07/2012  . History of HPV infection 12/07/2012  . Long term (current) use of anticoagulants 10/06/2012  . Chronic anticoagulation 04/22/2012  . S/P bariatric surgery 04/22/2012  . Factor V Leiden (HCC) 04/22/2012  . Urine abnormality 04/16/2012  . Neck pain 02/23/2012  . OSA on CPAP 11/20/2011  . Hyperglycemia 09/16/2010  . EDEMA 01/08/2010  . PLEURAL EFFUSION 08/27/2009  . ENLARGEMENT OF LYMPH NODES 08/13/2009  . Hx of PE 05/24/2009  . Obesity 12/08/2007  . Hypothyroidism 06/09/2006  . HYPERCHOLESTEROLEMIA 06/09/2006  . ANXIETY 06/09/2006  . PANIC DISORDER 06/09/2006  . Essential hypertension 06/09/2006  . GLOMERULONEPHRITIS 06/09/2006  . Systemic lupus erythematosus (HCC) 06/09/2006  . DVT, HX OF 06/09/2006        Kipp LaurenceStephanie R Savaughn Karwowski, PT, DPT 12/07/15 10:44 AM     [facility] [place] [address]  [address] [place], [place], [address] Phone: [phone]   Fax:  [phone]  Name: Bertram DenverLori A Price MRN: [id number] Date of Birth: 11/22/1965

## 2015-12-10 ENCOUNTER — Ambulatory Visit: Payer: Managed Care, Other (non HMO) | Admitting: Physical Therapy

## 2015-12-10 DIAGNOSIS — M542 Cervicalgia: Secondary | ICD-10-CM | POA: Diagnosis not present

## 2015-12-10 DIAGNOSIS — R293 Abnormal posture: Secondary | ICD-10-CM

## 2015-12-10 NOTE — Therapy (Signed)
[facility] [address]  [address] [place], [place], [address] Phone: [phone]   Fax:  [phone]  Physical Therapy Treatment  Patient Details  Name: [name] MRN: [id number] Date of Birth: [birth year] Referring Provider: Dr. Audrie Gallus Tower  Encounter Date: 12/10/2015      PT End of Session - 12/10/15 1202    Visit Number 2   Number of Visits 12   Date for PT Re-Evaluation 01/18/16   PT Start Time 0932   PT Stop Time 1013   PT Time Calculation (min) 41 min   Activity Tolerance Patient tolerated treatment well;No increased pain   Behavior During Therapy WFL for tasks assessed/performed      Past Medical History:  Diagnosis Date  . Anxiety state, unspecified    panic attacks  . Arthritis    Lupus - hands/knees  . Edema   . 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 (HCC)    Loculated-chronic on Left-VanTright; s/p VATS 5/10  . 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 GERD (gastroesophageal reflux disease)    on prilosec r/t gastric sleeve surgery  . HLD (hyperlipidemia)   . Iron deficiency anemia    IV dextran [place]ato  . Nephritis and nephropathy, not specified as acute or chronic, with unspecified pathological lesion in kidney   . Nonspecific abnormal results of liver function study   . Obesity, unspecified   . Other pulmonary embolism and infarction (HCC)   . Other specified acquired hypothyroidism   . Panic disorder without agoraphobia   . Personal history of venous thrombosis and embolism 2002   during pregnancy; ?factor 5 leiden (sees heme)  . Pleural effusion 2005   c/w lupus initial w/u; recurrent Right as pred tapered off July 2011  . Pure hypercholesterolemia   . Sleep apnea   . 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 Unspecified essential hypertension   . Unspecified pleural effusion     Past Surgical History:  Procedure Laterality Date   . gastric sleeve  8/12   bariatric surgery Dr Adolphus Birchwood   . LAPAROSCOPIC TUBAL LIGATION  12/09/2010   Procedure: LAPAROSCOPIC TUBAL LIGATION;  Surgeon: Juluis Mire;  Location: [facility];  Service: Gynecology;  Laterality: Bilateral;  Attempted see Nursing note  . pleurocentesis  10/2004  . Pleuryx catheter placement  9/11    VanTright----removed 9/11  . RENAL BIOPSY  09/24/2012  . svd     x 3  . THORACENTESIS  2010   with penumonia  . WISDOM TOOTH EXTRACTION      There were no vitals filed for this visit.      Subjective Assessment - 12/10/15 0933    Subjective Patient reporting no headache symptoms; was able to run errands without pain interferring.    Pertinent History Lupus   Diagnostic tests xray and MRI performed with no diagnostic findings   Patient Stated Goals improve headache symptoms   Currently in Pain? No/denies   Aggravating Factors  looking at computer   Pain Relieving Factors meds, sleeping, dark room                         [facility] Adult PT Treatment/Exercise - 12/10/15 0958      Neck Exercises: Seated   Neck Retraction 15 reps;3 secs   Neck Retraction Limitations chin tucks; improved since last visit  Shoulder Exercises: Seated   Retraction Both;15 reps   Other Seated Exercises BATCA pull down 15 x 15#     Shoulder Exercises: Standing   Horizontal ABduction Both;15 reps;Theraband   Theraband Level (Shoulder Horizontal ABduction) Level 3 (Green)   Row Both;15 reps;Theraband   Theraband Level (Shoulder Row) Level 3 (Green)     Shoulder Exercises: ROM/Strengthening   UBE (Upper Arm Bike) level 2 foward/backward 3' each     Shoulder Exercises: Lawyertretch   Corner Stretch 2 reps;20 seconds   Corner Stretch Limitations 3 way door stretch low/mid/high     Manual Therapy   Manual Therapy Soft tissue mobilization;Passive ROM   Soft tissue mobilization STM to cervical paraspinals, B UT and B levator scap with patient in supine; suboccipital  release 3 x 1 minute each    Passive ROM B UT stretch with patient in prone 3 x 30 seconds each                  PT Short Term Goals - 12/07/15 1029      PT SHORT TERM GOAL #1   Title Patient to be independent with initial HEP (12/21/15)   Time 2   Period Weeks   Status New           PT Long Term Goals - 12/07/15 1030      PT LONG TERM GOAL #1   Title Patient to be independent with advanced HEP (01/18/16)   Time 6   Period Weeks   Status New     PT LONG TERM GOAL #2   Title Patient to demonstrate good postural awareness with ability to correct independently to reduce stress on cervical/shoulder musculature for reduced headache symptoms (01/18/16)   Time 6   Period Weeks   Status New     PT LONG TERM GOAL #3   Title Patient to report headache pain <2/10 for greater than 2 weeks (01/18/16)   Time 6   Period Weeks   Status New               Plan - 12/10/15 1203    Clinical Impression Statement Patient with subjective reports of no headache/neck pain symptoms over the weekend. Patient demonstrating good compliance with HEP. Continued education with patient regardning need for postural awareness and correction throughout day to reduced stress on neck musculature. Progression of postural tasks and stretching this visit with no symptom reproduction.    PT Treatment/Interventions ADLs/Self Care Home Management;Cryotherapy;Electrical Stimulation;Moist Heat;Traction;Ultrasound;Therapeutic exercise;Therapeutic activities;Patient/family education;Manual techniques;Passive range of motion;Taping;Dry needling   PT Next Visit Plan STM, progress postural exercise   Consulted and Agree with Plan of Care Patient      Patient will benefit from skilled therapeutic intervention in order to improve the following deficits and impairments:  Decreased activity tolerance, Decreased range of motion, Decreased strength, Pain, Increased muscle spasms  Visit  Diagnosis: Cervicalgia  Abnormal posture     Problem List Patient Active Problem List   Diagnosis Date Noted  . Vitamin B12 deficiency 11/15/2015  . Muscle cramps 11/14/2015  . Nausea with vomiting 10/15/2015  . Allergic rhinitis 07/01/2015  . History of cerebral hemorrhage 07/01/2015  . Headache 06/29/2015  . Screening for osteoporosis 11/04/2013  . Headache(784.0) 10/25/2013  . Pain, eye, right 10/25/2013  . Encounter for therapeutic drug monitoring 03/25/2013  . CKD (chronic kidney disease) stage 2, GFR 60-89 ml/min 12/20/2012  . 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 Routine general medical examination at a health  care facility 12/07/2012  . Steroid long-term use 12/07/2012  . History of HPV infection 12/07/2012  . Long term (current) use of anticoagulants 10/06/2012  . Chronic anticoagulation 04/22/2012  . S/P bariatric surgery 04/22/2012  . Factor V Leiden (HCC) 04/22/2012  . Urine abnormality 04/16/2012  . Neck pain 02/23/2012  . OSA on CPAP 11/20/2011  . Hyperglycemia 09/16/2010  . EDEMA 01/08/2010  . PLEURAL EFFUSION 08/27/2009  . ENLARGEMENT OF LYMPH NODES 08/13/2009  . Hx of PE 05/24/2009  . Obesity 12/08/2007  . Hypothyroidism 06/09/2006  . HYPERCHOLESTEROLEMIA 06/09/2006  . ANXIETY 06/09/2006  . PANIC DISORDER 06/09/2006  . Essential hypertension 06/09/2006  . GLOMERULONEPHRITIS 06/09/2006  . Systemic lupus erythematosus (HCC) 06/09/2006  . DVT, HX OF 06/09/2006      Kipp LaurenceStephanie R Keano Guggenheim, PT, DPT 12/10/15 12:07 PM     [facility] [place] [address]  [address] [place], [place], [address] Phone: [phone]   Fax:  [phone]  Name: [name] MRN: [id number] Date of Birth: 03/28/1965

## 2015-12-13 ENCOUNTER — Ambulatory Visit (INDEPENDENT_AMBULATORY_CARE_PROVIDER_SITE_OTHER): Payer: Managed Care, Other (non HMO)

## 2015-12-13 ENCOUNTER — Ambulatory Visit: Payer: Managed Care, Other (non HMO) | Admitting: Physical Therapy

## 2015-12-13 DIAGNOSIS — M542 Cervicalgia: Secondary | ICD-10-CM | POA: Diagnosis not present

## 2015-12-13 DIAGNOSIS — E538 Deficiency of other specified B group vitamins: Secondary | ICD-10-CM

## 2015-12-13 DIAGNOSIS — R293 Abnormal posture: Secondary | ICD-10-CM

## 2015-12-13 MED ORDER — CYANOCOBALAMIN 1000 MCG/ML IJ SOLN
1000.0000 ug | Freq: Once | INTRAMUSCULAR | Status: AC
  Administered 2015-12-13: 1000 ug via INTRAMUSCULAR

## 2015-12-13 NOTE — Progress Notes (Signed)
Pt was in today for 2nd B12 injection per order dated 11/15/15. Pt denied having an adverse reaction to previous injection.

## 2015-12-13 NOTE — Patient Instructions (Signed)
Levator Scapula Stretch, Sitting    Sit, one hand tucked under hip on side to be stretched, other hand over top of head. Turn head toward other side and look down. Use hand on head to gently stretch neck in that position. Hold _30__ seconds. Repeat _3__ times per session.    

## 2015-12-13 NOTE — Therapy (Signed)
[facility] [address]  [address] [place], [place], [address] Phone: [phone]   Fax:  [phone]  Physical Therapy Treatment  Patient Details  Name: Cheryl DenverLori A Price MRN: [id number] Date of Birth: 01/23/1966 Referring Provider: Dr. Audrie GallusMarne A Tower  Encounter Date: 12/13/2015      PT End of Session - 12/13/15 1018    Visit Number 3   Number of Visits 12   Date for PT Re-Evaluation 01/18/16   PT Start Time 0931   PT Stop Time 1011   PT Time Calculation (min) 40 min   Activity Tolerance Patient tolerated treatment well;No increased pain   Behavior During Therapy WFL for tasks assessed/performed      Past Medical History:  Diagnosis Date  . Anxiety state, unspecified    panic attacks  . Arthritis    Lupus - hands/knees  . Edema   . 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 (HCC)    Loculated-chronic on Left-VanTright; s/p VATS 5/10  . 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 GERD (gastroesophageal reflux disease)    on prilosec r/t gastric sleeve surgery  . HLD (hyperlipidemia)   . Iron deficiency anemia    IV dextran Coladonato  . Nephritis and nephropathy, not specified as acute or chronic, with unspecified pathological lesion in kidney   . Nonspecific abnormal results of liver function study   . Obesity, unspecified   . Other pulmonary embolism and infarction (HCC)   . Other specified acquired hypothyroidism   . Panic disorder without agoraphobia   . Personal history of venous thrombosis and embolism 2002   during pregnancy; ?factor 5 leiden (sees heme)  . Pleural effusion 2005   c/w lupus initial w/u; recurrent Right as pred tapered off July 2011  . Pure hypercholesterolemia   . Sleep apnea   . 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 Unspecified essential hypertension   . Unspecified pleural effusion     Past Surgical History:  Procedure Laterality Date   . gastric sleeve  8/12   bariatric surgery Dr Adolphus Birchwoodasher   . LAPAROSCOPIC TUBAL LIGATION  12/09/2010   Procedure: LAPAROSCOPIC TUBAL LIGATION;  Surgeon: Juluis MireJohn S McComb;  Location: [facility];  Service: Gynecology;  Laterality: Bilateral;  Attempted see Nursing note  . pleurocentesis  10/2004  . Pleuryx catheter placement  9/11    VanTright----removed 9/11  . RENAL BIOPSY  09/24/2012  . svd     x 3  . THORACENTESIS  2010   with penumonia  . WISDOM TOOTH EXTRACTION      There were no vitals filed for this visit.      Subjective Assessment - 12/13/15 1007    Subjective Patient with no headache symptoms this week, just feels tight on top of shoulders; compliant with HEP    Pertinent History Lupus   Diagnostic tests xray and MRI performed with no diagnostic findings   Patient Stated Goals improve headache symptoms   Currently in Pain? No/denies   Aggravating Factors  looking at computer   Pain Relieving Factors meds, sleeping, dark room                         [facility]/Exercise - 12/13/15 1008      Neck Exercises: Seated   Neck Retraction Limitations HEP review      Shoulder Exercises:  Seated   Retraction Limitations HEP review    Other Seated Exercises BATCA pull down 15 x 35#     Shoulder Exercises: Standing   Horizontal ABduction Both;15 reps;Theraband   Theraband Level (Shoulder Horizontal ABduction) Level 3 (Green)   External Rotation Both;15 reps;Theraband   Theraband Level (Shoulder External Rotation) Level 3 (Green)   Row Both;15 reps;Theraband   Theraband Level (Shoulder Row) Level 4 (Blue)     Shoulder Exercises: ROM/Strengthening   Cybex Row Limitations 25#; 15 x each hand hold     Shoulder Exercises: Lawyer 2 reps;30 seconds   Corner Stretch Limitations 3 way door stretch low/mid/high     Manual Therapy   Manual Therapy Soft tissue mobilization   Soft tissue mobilization STM to cervical paraspinals, B UT and B  levator scap with patient in supine; suboccipital release 3 x 1 minute each      Neck Exercises: Stretches   Upper Trapezius Stretch 3 reps;30 seconds   Upper Trapezius Stretch Limitations HEP review; instructed in chin tuck prior to stretch for greater effect,   Levator Stretch 3 reps;30 seconds  bilateral with slight overpressure                PT Education - 12/13/15 1005    Education provided Yes   Education Details addition of levator scap stretch, horizontal abduction, and external rotation with theraband   Person(s) Educated Patient   Methods Explanation;Demonstration;Handout   Comprehension Verbalized understanding;Returned demonstration          PT Short Term Goals - 12/07/15 1029      PT SHORT TERM GOAL #1   Title Patient to be independent with initial HEP (12/21/15)   Time 2   Period Weeks   Status New           PT Long Term Goals - 12/07/15 1030      PT LONG TERM GOAL #1   Title Patient to be independent with advanced HEP (01/18/16)   Time 6   Period Weeks   Status New     PT LONG TERM GOAL #2   Title Patient to demonstrate good postural awareness with ability to correct independently to reduce stress on cervical/shoulder musculature for reduced headache symptoms (01/18/16)   Time 6   Period Weeks   Status New     PT LONG TERM GOAL #3   Title Patient to report headache pain <2/10 for greater than 2 weeks (01/18/16)   Time 6   Period Weeks   Status New               Plan - 12/13/15 1018    Clinical Impression Statement Patient conitnuing to find relief from skilled PT with STM, gentle stretching, and gentle strengthening of postural musculature. Patient with no headache symptoms with only subjective reports of B UT "tightness." Patient compliant with HEP, however does require VC and additional education to reduced UT involvement with all exercises. Patient to continue to benefit from PT to progress postural awareness and reduce  overall pain and tightness.    PT Treatment/Interventions ADLs/Self Care Home Management;Cryotherapy;Electrical Stimulation;Moist Heat;Traction;Ultrasound;Therapeutic exercise;Therapeutic activities;Patient/family education;Manual techniques;Passive range of motion;Taping;Dry needling   PT Next Visit Plan STM, progress postural exercise   Consulted and Agree with Plan of Care Patient      Patient will benefit from skilled therapeutic intervention in order to improve the following deficits and impairments:  Decreased activity tolerance, Decreased range of motion, Decreased strength, Pain,  Increased muscle spasms  Visit Diagnosis: Cervicalgia  Abnormal posture     Problem List Patient Active Problem List   Diagnosis Date Noted  . Vitamin B12 deficiency 11/15/2015  . Muscle cramps 11/14/2015  . Nausea with vomiting 10/15/2015  . Allergic rhinitis 07/01/2015  . History of cerebral hemorrhage 07/01/2015  . Headache 06/29/2015  . Screening for osteoporosis 11/04/2013  . Headache(784.0) 10/25/2013  . Pain, eye, right 10/25/2013  . Encounter for therapeutic drug monitoring 03/25/2013  . CKD (chronic kidney disease) stage 2, GFR 60-89 ml/min 12/20/2012  . 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 Routine general medical examination at a health care facility 12/07/2012  . Steroid long-term use 12/07/2012  . History of HPV infection 12/07/2012  . Long term (current) use of anticoagulants 10/06/2012  . Chronic anticoagulation 04/22/2012  . S/P bariatric surgery 04/22/2012  . Factor V Leiden (HCC) 04/22/2012  . Urine abnormality 04/16/2012  . Neck pain 02/23/2012  . OSA on CPAP 11/20/2011  . Hyperglycemia 09/16/2010  . EDEMA 01/08/2010  . PLEURAL EFFUSION 08/27/2009  . ENLARGEMENT OF LYMPH NODES 08/13/2009  . Hx of PE 05/24/2009  . Obesity 12/08/2007  . Hypothyroidism 06/09/2006  . HYPERCHOLESTEROLEMIA 06/09/2006  . ANXIETY 06/09/2006  . PANIC DISORDER 06/09/2006  . Essential  hypertension 06/09/2006  . GLOMERULONEPHRITIS 06/09/2006  . Systemic lupus erythematosus (HCC) 06/09/2006  . DVT, HX OF 06/09/2006      Kipp LaurenceStephanie R Aaron, PT, DPT 12/13/15 10:22 AM     [facility] [place] [address]  [address] [place], [place], [address] Phone: [phone]   Fax:  [phone]  Name: Cheryl DenverLori A Antonelli MRN: [id number] Date of Birth: 08/12/1965

## 2015-12-17 ENCOUNTER — Ambulatory Visit: Payer: Managed Care, Other (non HMO) | Admitting: Physical Therapy

## 2015-12-17 DIAGNOSIS — R293 Abnormal posture: Secondary | ICD-10-CM

## 2015-12-17 DIAGNOSIS — M542 Cervicalgia: Secondary | ICD-10-CM

## 2015-12-17 NOTE — Therapy (Addendum)
[facility] [address]  [place], [place], [address] Phone: [phone]   Fax:  [phone]  Physical Therapy Treatment  Patient Details  Name: [name] MRN: [id number] Date of Birth: 04/20/1965 Referring Provider: Dr. [name] Tower  Encounter Date: 12/17/2015      PT End of Session - 12/17/15 1000    Visit Number 4   Number of Visits 12   Date for PT Re-Evaluation 01/18/16   PT Start Time 0932   PT Stop Time 1011   PT Time Calculation (min) 39 min   Activity Tolerance Patient tolerated treatment well;No increased pain   Behavior During Therapy WFL for tasks assessed/performed      Past Medical History:  Diagnosis Date  . Anxiety state, unspecified    panic attacks  . Arthritis    Lupus - hands/knees  . Edema   . 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 ([place])    Loculated-chronic on Left-VanTright; s/p VATS 5/10  . 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 GERD (gastroesophageal reflux disease)    on prilosec r/t gastric sleeve surgery  . HLD (hyperlipidemia)   . Iron deficiency anemia    IV dextran Coladonato  . Nephritis and nephropathy, not specified as acute or chronic, with unspecified pathological lesion in kidney   . Nonspecific abnormal results of liver function study   . Obesity, unspecified   . Other pulmonary embolism and infarction ([place])   . Other specified acquired hypothyroidism   . Panic disorder without agoraphobia   . Personal history of venous thrombosis and embolism 2002   during pregnancy; ?factor 5 leiden (sees heme)  . Pleural effusion 2005   c/w lupus initial w/u; recurrent Right as pred tapered off July 2011  . Pure hypercholesterolemia   . Sleep apnea   . 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 Unspecified essential hypertension   . Unspecified pleural effusion     Past Surgical History:  Procedure Laterality Date   . gastric sleeve  8/12   bariatric surgery Dr Evorn Gong   . LAPAROSCOPIC TUBAL LIGATION  12/09/2010   Procedure: LAPAROSCOPIC TUBAL LIGATION;  Surgeon: Darlyn Chamber;  Location: [facility];  Service: Gynecology;  Laterality: Bilateral;  Attempted see Nursing note  . pleurocentesis  10/2004  . Pleuryx catheter placement  9/11    VanTright----removed 9/11  . RENAL BIOPSY  09/24/2012  . svd     x 3  . THORACENTESIS  2010   with penumonia  . WISDOM TOOTH EXTRACTION      There were no vitals filed for this visit.      Subjective Assessment - 12/17/15 0935    Subjective Patient with continued reports of no headache symptoms, tightness seems to be resolving.    Pertinent History Lupus   Diagnostic tests xray and MRI performed with no diagnostic findings   Patient Stated Goals improve headache symptoms   Currently in Pain? No/denies   Aggravating Factors  looking at computer   Pain Relieving Factors meds, sleeping, dark room                         [facility] Adult PT Treatment/Exercise - 12/17/15 1001      Neck Exercises: Supine   Neck Retraction Limitations Chin tucks x 15   Other Supine Exercise push up plus x  15 - 2#     Shoulder Exercises: Seated   Retraction Limitations HEP review   Horizontal ABduction Both;15 reps;Theraband   Theraband Level (Shoulder Horizontal ABduction) Level 4 (Blue)   External Rotation Both;15 reps;Theraband   Theraband Level (Shoulder External Rotation) Level 4 (Blue)   Other Seated Exercises BATCA pull down 35# x 15   Other Seated Exercises BATCA row 35# x 15     Shoulder Exercises: ROM/Strengthening   UBE (Upper Arm Bike) level 3 forward/backwar 3' each     Manual Therapy   Manual Therapy Soft tissue mobilization;Passive ROM;Myofascial release   Soft tissue mobilization STM to cervical paraspinals, B UT, and levator scap   Myofascial Release Suboccipital release 2 x 1 minute   Passive ROM B UT  stretch 3 x 30 seconds     Neck  Exercises: Stretches   Upper Trapezius Stretch Limitations HEP review   Levator Stretch --  HEP review                  PT Short Term Goals - 12/07/15 1029 - revised 01/22/16     PT SHORT TERM GOAL #1   Title Patient to be independent with initial HEP (12/21/15)   Time 2   Period Weeks   Status Achieved           PT Long Term Goals - 12/07/15 1030 - revised 01/22/16     PT LONG TERM GOAL #1   Title Patient to be independent with advanced HEP (01/18/16)   Time 6   Period Weeks   Status Achieved     PT LONG TERM GOAL #2   Title Patient to demonstrate good postural awareness with ability to correct independently to reduce stress on cervical/shoulder musculature for reduced headache symptoms (01/18/16)   Time 6   Period Weeks   Status Achieved     PT LONG TERM GOAL #3   Title Patient to report headache pain <2/10 for greater than 2 weeks (01/18/16)   Time 6   Period Weeks   Status Achieved               Plan - 12/17/15 1001    Clinical Impression Statement Patient demonstrating good tissue quality with reduced palpable muscle tension and with subjective reports of improved tissue flexibility. Patient with ability to demonstrate postural self-correction throughout treatment session as well as verbalize ways to improve pain/tightness throughout her day. Discussion with patient today regarding d/c from PT within 1-2 weeks as patient feels she can maintain current functional status at that time.   PT Treatment/Interventions ADLs/Self Care Home Management;Cryotherapy;Electrical Stimulation;Moist Heat;Traction;Ultrasound;Therapeutic exercise;Therapeutic activities;Patient/family education;Manual techniques;Passive range of motion;Taping;Dry needling   PT Next Visit Plan STM, progress postural exercise   Consulted and Agree with Plan of Care Patient      Patient will benefit from skilled therapeutic intervention in order to improve the following deficits and  impairments:  Decreased activity tolerance, Decreased range of motion, Decreased strength, Pain, Increased muscle spasms  Visit Diagnosis: Cervicalgia  Abnormal posture     Problem List Patient Active Problem List   Diagnosis Date Noted  . Vitamin B12 deficiency 11/15/2015  . Muscle cramps 11/14/2015  . Nausea with vomiting 10/15/2015  . Allergic rhinitis 07/01/2015  . History of cerebral hemorrhage 07/01/2015  . Headache 06/29/2015  . Screening for osteoporosis 11/04/2013  . Headache(784.0) 10/25/2013  . Pain, eye, right 10/25/2013  . Encounter for therapeutic drug monitoring 03/25/2013  . CKD (chronic kidney disease)  stage 2, GFR 60-89 ml/min 12/20/2012  . 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[place]) 12/20/2012  . Routine general medical examination at a health care facility 12/07/2012  . Steroid long-term use 12/07/2012  . History of HPV infection 12/07/2012  . Long term (current) use of anticoagulants 10/06/2012  . Chronic anticoagulation 04/22/2012  . S/P bariatric surgery 04/22/2012  . Factor V Leiden ([place]) 04/22/2012  . Urine abnormality 04/16/2012  . Neck pain 02/23/2012  . OSA on CPAP 11/20/2011  . Hyperglycemia 09/16/2010  . EDEMA 01/08/2010  . PLEURAL EFFUSION 08/27/2009  . ENLARGEMENT OF LYMPH NODES 08/13/2009  . Hx of PE 05/24/2009  . Obesity 12/08/2007  . Hypothyroidism 06/09/2006  . HYPERCHOLESTEROLEMIA 06/09/2006  . ANXIETY 06/09/2006  . PANIC DISORDER 06/09/2006  . Essential hypertension 06/09/2006  . GLOMERULONEPHRITIS 06/09/2006  . Systemic lupus erythematosus ([place]) 06/09/2006  . DVT, HX OF 06/09/2006      Lanney Gins, PT, DPT 12/17/15 10:21 AM    PHYSICAL THERAPY DISCHARGE SUMMARY  Visits from Start of Care: 4  Current functional level related to goals / functional outcomes: See above   Remaining deficits: See above; slight tissue tightness in upper back/cervical musculature. Patient understands and is able to verbalize pain reduction  strategies.    Education / Equipment: HEP  Plan: Patient agrees to discharge.  Patient goals were met. Patient is being discharged due to being pleased with the current functional level.  ?????     Patient cancelled last remaining treatment sessions with subjective reports of reduced pain levels as well as absence of headache symptoms for greater than 2 weeks. Patient improved overall postural awareness and is able to demonstrate ability to self correct posture and verbalize pain reduction strategies. Patient to be d/c from Pt.  Lanney Gins, PT, DPT 01/22/16 8:58 AM   [facility] [address]  [place], [place], [address] Phone: [phone]   Fax:  [phone]  Name: [name] MRN: [id number] Date of Birth: [birth year]

## 2015-12-18 ENCOUNTER — Other Ambulatory Visit: Payer: Self-pay

## 2015-12-18 MED ORDER — LABETALOL HCL 200 MG PO TABS: ORAL_TABLET | ORAL | 3 refills | Status: DC

## 2015-12-18 NOTE — Telephone Encounter (Signed)
Pt request refill labetalol to [facility] [address]. Refill done per protocol. Last seen 11/14/15. Pt notified done and voiced understanding.

## 2015-12-18 NOTE — Telephone Encounter (Signed)
Received a notification that the Rx didn't go through (e-prescribing error), Rx resent

## 2015-12-18 NOTE — Addendum Note (Signed)
Addended by: Shon MilletWATLINGTON, Anala Whisenant M on: 12/18/2015 01:05 PM   Modules accepted: Orders

## 2015-12-20 ENCOUNTER — Ambulatory Visit: Payer: Managed Care, Other (non HMO) | Admitting: Physical Therapy

## 2015-12-20 ENCOUNTER — Ambulatory Visit: Payer: Managed Care, Other (non HMO)

## 2015-12-20 ENCOUNTER — Ambulatory Visit (INDEPENDENT_AMBULATORY_CARE_PROVIDER_SITE_OTHER): Payer: Managed Care, Other (non HMO) | Admitting: *Deleted

## 2015-12-20 DIAGNOSIS — E538 Deficiency of other specified B group vitamins: Secondary | ICD-10-CM | POA: Diagnosis not present

## 2015-12-20 MED ORDER — CYANOCOBALAMIN 1000 MCG/ML IJ SOLN
1000.0000 ug | Freq: Once | INTRAMUSCULAR | Status: AC
  Administered 2015-12-20: 1000 ug via INTRAMUSCULAR

## 2015-12-24 ENCOUNTER — Ambulatory Visit: Payer: Managed Care, Other (non HMO) | Admitting: Physical Therapy

## 2015-12-26 ENCOUNTER — Ambulatory Visit: Payer: Managed Care, Other (non HMO) | Admitting: Physical Therapy

## 2015-12-26 ENCOUNTER — Ambulatory Visit (INDEPENDENT_AMBULATORY_CARE_PROVIDER_SITE_OTHER): Payer: Managed Care, Other (non HMO)

## 2015-12-26 DIAGNOSIS — E538 Deficiency of other specified B group vitamins: Secondary | ICD-10-CM | POA: Diagnosis not present

## 2015-12-26 MED ORDER — CYANOCOBALAMIN 1000 MCG/ML IJ SOLN
1000.0000 ug | Freq: Once | INTRAMUSCULAR | Status: AC
  Administered 2015-12-26: 1000 ug via INTRAMUSCULAR

## 2016-01-03 ENCOUNTER — Other Ambulatory Visit: Payer: Self-pay | Admitting: Family Medicine

## 2016-01-08 ENCOUNTER — Other Ambulatory Visit: Payer: Self-pay | Admitting: Family Medicine

## 2016-01-11 ENCOUNTER — Ambulatory Visit: Payer: Managed Care, Other (non HMO)

## 2016-01-16 ENCOUNTER — Ambulatory Visit (INDEPENDENT_AMBULATORY_CARE_PROVIDER_SITE_OTHER): Payer: Managed Care, Other (non HMO) | Admitting: General Practice

## 2016-01-16 DIAGNOSIS — D6851 Activated protein C resistance: Secondary | ICD-10-CM

## 2016-01-16 DIAGNOSIS — Z5181 Encounter for therapeutic drug level monitoring: Secondary | ICD-10-CM

## 2016-01-16 LAB — POCT INR: INR: 2.5

## 2016-01-16 NOTE — Progress Notes (Signed)
I have reviewed and agree with the plan. 

## 2016-01-16 NOTE — Patient Instructions (Signed)
Pre visit review using our clinic review tool, if applicable. No additional management support is needed unless otherwise documented below in the visit note. 

## 2016-01-29 ENCOUNTER — Encounter: Payer: Self-pay | Admitting: *Deleted

## 2016-01-29 ENCOUNTER — Ambulatory Visit: Payer: Managed Care, Other (non HMO)

## 2016-01-31 ENCOUNTER — Ambulatory Visit (INDEPENDENT_AMBULATORY_CARE_PROVIDER_SITE_OTHER): Payer: Managed Care, Other (non HMO)

## 2016-01-31 DIAGNOSIS — E559 Vitamin D deficiency, unspecified: Secondary | ICD-10-CM | POA: Diagnosis not present

## 2016-01-31 MED ORDER — CYANOCOBALAMIN 1000 MCG/ML IJ SOLN
1000.0000 ug | Freq: Once | INTRAMUSCULAR | Status: AC
  Administered 2016-01-31: 1000 ug via INTRAMUSCULAR

## 2016-02-15 ENCOUNTER — Encounter: Payer: Self-pay | Admitting: Internal Medicine

## 2016-02-15 ENCOUNTER — Ambulatory Visit (INDEPENDENT_AMBULATORY_CARE_PROVIDER_SITE_OTHER): Payer: Managed Care, Other (non HMO) | Admitting: Internal Medicine

## 2016-02-15 VITALS — BP 150/88 | HR 85 | Ht 65.0 in | Wt 287.4 lb

## 2016-02-15 DIAGNOSIS — I350 Nonrheumatic aortic (valve) stenosis: Secondary | ICD-10-CM | POA: Diagnosis not present

## 2016-02-15 DIAGNOSIS — E7800 Pure hypercholesterolemia, unspecified: Secondary | ICD-10-CM | POA: Diagnosis not present

## 2016-02-15 MED ORDER — ROSUVASTATIN CALCIUM 20 MG PO TABS: 20.0000 mg | ORAL_TABLET | Freq: Every day | ORAL | 3 refills | Status: DC

## 2016-02-15 NOTE — Progress Notes (Signed)
Cardiology Office Note   Date:  02/15/2016   ID:  Cheryl DenverLori A Eanes, DOB 11/07/1965, MRN [id number]  PCP:  Roxy MannsMarne Tower, MD  Cardiologist:   Dietrich PatesPaula Marelin Tat, MD   F/u of AOrtic stenosis   History of Present Illness: [name] is a 51 y.o. female with a history of factor V leiden  Hx of PE and DVT as well as intracerebral hemorrhage  Sees Drs Milinda Antisower, colodonato and LathropGranfortuna.  Mild to mod AS on echo  I saw her in Dec 2016   Since seen her breathing is unchanged   No CP  No dizziness    Current Meds  Medication Sig  . atorvastatin (LIPITOR) 20 MG tablet TAKE 1 TABLET (20 MG TOTAL) BY MOUTH DAILY.  Marland Kitchen. BIOTIN PO Take 1 capsule by mouth daily.  . Cyanocobalamin (VITAMIN B-12 PO) Take 1 capsule by mouth daily.  . cyclobenzaprine (FLEXERIL) 5 MG tablet Take 0.5-1 tablets (2.5-5 mg total) by mouth 3 (three) times daily as needed for muscle spasms (headache). Caution of sedation  . IRON PO Take 1 tablet by mouth daily.  Marland Kitchen. labetalol (NORMODYNE) 200 MG tablet Take 1 and 1/2 tablets  by mouth twice a day  . levothyroxine (SYNTHROID, LEVOTHROID) 175 MCG tablet TAKE 1 TABLET (175 MCG TOTAL) BY MOUTH DAILY.  . Multiple Vitamin (MULTIVITAMIN) tablet Take 1 tablet by mouth daily.  Marland Kitchen. omeprazole (PRILOSEC) 20 MG capsule TAKE 1 CAPSULE (20 MG TOTAL) BY MOUTH 2 (TWO) TIMES DAILY BEFORE A MEAL.  Marland Kitchen. PARoxetine (PAXIL) 40 MG tablet TAKE 1 TABLET (40 MG TOTAL) BY MOUTH EVERY MORNING.  Marland Kitchen. warfarin (COUMADIN) 7.5 MG tablet Take as directed by anticoagulation clinic     Allergies:   Lisinopril; Lovenox [enoxaparin sodium]; Moxifloxacin; and Amoxicillin   Past Medical History:  Diagnosis Date  . Anxiety state, unspecified    panic attacks  . Arthritis    Lupus - hands/knees  . Edema   . 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 GERD (gastroesophageal reflux disease)    on prilosec r/t gastric sleeve  surgery  . HLD (hyperlipidemia)   . Iron deficiency anemia    IV dextran Coladonato  . Nephritis and nephropathy, not specified as acute or chronic, with unspecified pathological lesion in kidney   . Nonspecific abnormal results of liver function study   . Obesity, unspecified   . Other pulmonary embolism and infarction   . Other specified acquired hypothyroidism   . Panic disorder without agoraphobia   . Personal history of venous thrombosis and embolism 2002   during pregnancy; ?factor 5 leiden (sees heme)  . Pleural effusion 2005   c/w lupus initial w/u; recurrent Right as pred tapered off July 2011  . Pure hypercholesterolemia   . Sleep apnea   . 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 Unspecified essential hypertension   . Unspecified pleural effusion     Past Surgical History:  Procedure Laterality Date  . gastric sleeve  8/12   bariatric surgery Dr Adolphus Birchwoodasher   . LAPAROSCOPIC TUBAL LIGATION  12/09/2010   Procedure: LAPAROSCOPIC TUBAL LIGATION;  Surgeon: Juluis MireJohn S McComb;  Location: [facility];  Service: Gynecology;  Laterality: Bilateral;  Attempted see Nursing note  . pleurocentesis  10/2004  . Pleuryx catheter placement  9/11    VanTright----removed  9/11  . RENAL BIOPSY  09/24/2012  . svd     x 3  . THORACENTESIS  2010   with penumonia  . WISDOM TOOTH EXTRACTION       Social History:  The patient  reports that she quit smoking about 6 years ago. Her smoking use included Cigarettes. She has a 5.00 pack-year smoking history. She has never used smokeless tobacco. She reports that she drinks alcohol. She reports that she does not use drugs.   Family History:  [name] family history includes Hyperlipidemia in her father; Hypertension in her father; Leukemia in her sister; Lupus in her sister.    ROS:  Please see the history of present illness. All other systems are reviewed and  Negative to the above problem except as noted.     PHYSICAL EXAM: VS:  BP (!) 150/88   Pulse 85   Ht 5\' 5"  (1.651 m)   Wt 287 lb 6.4 oz (130.4 kg)   BMI 47.83 kg/m   GEN: Well nourished, well developed, in no acute distress  HEENT: normal  Neck: no JVD, carotid bruits, or masses Cardiac: RRR; Gr II/VI systolic murmur at base, rubs, or gallops,no edema  Respiratory:  clear to auscultation bilaterally, normal work of breathing GI: soft, nontender, nondistended, + BS  No hepatomegaly  MS: no deformity Moving all extremities   Skin: warm and dry, no rash Neuro:  Strength and sensation are intact Psych: euthymic mood, full affect   EKG:  EKG is ordered today.  SR 86 pm    Lipid Panel    Component Value Date/Time   CHOL 201 (H) 11/14/2015 1541   TRIG 105.0 11/14/2015 1541   HDL 43.50 11/14/2015 1541   CHOLHDL 5 11/14/2015 1541   VLDL 21.0 11/14/2015 1541   LDLCALC 137 (H) 11/14/2015 1541   LDLDIRECT 203.7 06/03/2011 1433      Wt Readings from Last 3 Encounters:  02/15/16 287 lb 6.4 oz (130.4 kg)  11/14/15 287 lb 12.8 oz (130.5 kg)  10/15/15 290 lb 4 oz (131.7 kg)      ASSESSMENT AND PLAN:  1  AS  Asymptomatic  Follow for now    2  Hx DVT/PE  Countinue coumadin    3  HL  I would recomm increasing Crestor to 20  F/U lipids    4  HTN  Follow  BP on my check 146/86     Current medicines are reviewed at length with the patient today.  The patient does not have concerns regarding medicines.  Signed, Dietrich Pates, MD  02/15/2016 4:26 PM    [facility] [address] [place], [place], [place]  [address] Phone: [phone]; Fax: [phone]

## 2016-02-15 NOTE — Patient Instructions (Signed)
Your physician has recommended you make the following change in your medication:  1.) finish your current bottle of atorvastatin, then 2.) start rosuvastatin (Crestor) 20 mg once daily for cholesterol Your physician recommends that you return for lab work in: about 2 months after you start Crestor.    Your physician wants you to follow-up in: DEC/JAN with Dr. Tenny Crawoss.  You will receive a reminder letter in the mail two months in advance. If you don't receive a letter, please call our office to schedule the follow-up appointment.  Your physician has requested that you have an echocardiogram. Echocardiography is a painless test that uses sound waves to create images of your heart. It provides your doctor with information about the size and shape of your heart and how well your heart's chambers and valves are working. This procedure takes approximately one hour. There are no restrictions for this procedure.---PLEASE SCHEDULE FOR SAME DAY AS YOUR APPOINTMENT WITH DR. ROSS IN December OR January.

## 2016-02-22 ENCOUNTER — Ambulatory Visit (INDEPENDENT_AMBULATORY_CARE_PROVIDER_SITE_OTHER): Payer: Managed Care, Other (non HMO) | Admitting: Family Medicine

## 2016-02-22 ENCOUNTER — Encounter: Payer: Self-pay | Admitting: Family Medicine

## 2016-02-22 DIAGNOSIS — B349 Viral infection, unspecified: Secondary | ICD-10-CM | POA: Diagnosis not present

## 2016-02-22 MED ORDER — BENZONATATE 200 MG PO CAPS: 200.0000 mg | ORAL_CAPSULE | Freq: Three times a day (TID) | ORAL | 0 refills | Status: DC | PRN

## 2016-02-22 NOTE — Assessment & Plan Note (Signed)
Nontoxic.  Likely non-flu virus.  Rest and fluids.  Gargle with salt water.  Tessalon as needed for cough.  D/w pt.  She agree.   Update us as needed.

## 2016-02-22 NOTE — Patient Instructions (Addendum)
Likely non-flu virus.  Rest and fluids.  Gargle with salt water.  Tessalon as needed for cough.  Update us as needed.  Take care.  Glad to see you.

## 2016-02-22 NOTE — Progress Notes (Signed)
On coumadin at baseline.   Sx started about 5 days ago.  Aches initially.  Rested.  Still didn't fell well 4 days ago, some congestion, voice was hoarse.  Worked from home this week.  More cough as the week went on.  Fatigue.  Chest sore from coughing.  Yesterday she had a chills, early in the AM.  Dec in appetite.  Today with diarrhea.  No vomiting.    Meds, vitals, and allergies reviewed.   ROS: Per HPI unless specifically indicated in ROS section   GEN: nad, alert and oriented HEENT: mucous membranes moist, tm w/o erythema, nasal exam w/o erythema, clear discharge noted,  OP with cobblestoning NECK: supple w/o LA CV: rrr.   PULM: ctab, no inc wob EXT: no edema

## 2016-02-27 ENCOUNTER — Ambulatory Visit (INDEPENDENT_AMBULATORY_CARE_PROVIDER_SITE_OTHER): Payer: Managed Care, Other (non HMO) | Admitting: General Practice

## 2016-02-27 DIAGNOSIS — D6851 Activated protein C resistance: Secondary | ICD-10-CM

## 2016-02-27 DIAGNOSIS — Z5181 Encounter for therapeutic drug level monitoring: Secondary | ICD-10-CM

## 2016-02-27 LAB — POCT INR: INR: 1.9

## 2016-02-27 NOTE — Patient Instructions (Signed)
Pre visit review using our clinic review tool, if applicable. No additional management support is needed unless otherwise documented below in the visit note. 

## 2016-03-04 ENCOUNTER — Ambulatory Visit (INDEPENDENT_AMBULATORY_CARE_PROVIDER_SITE_OTHER): Payer: Managed Care, Other (non HMO) | Admitting: *Deleted

## 2016-03-04 DIAGNOSIS — Z23 Encounter for immunization: Secondary | ICD-10-CM | POA: Diagnosis not present

## 2016-03-04 DIAGNOSIS — E538 Deficiency of other specified B group vitamins: Secondary | ICD-10-CM | POA: Diagnosis not present

## 2016-03-04 MED ORDER — CYANOCOBALAMIN 1000 MCG/ML IJ SOLN
1000.0000 ug | Freq: Once | INTRAMUSCULAR | Status: AC
  Administered 2016-03-04: 1000 ug via INTRAMUSCULAR

## 2016-04-02 ENCOUNTER — Ambulatory Visit (INDEPENDENT_AMBULATORY_CARE_PROVIDER_SITE_OTHER): Payer: Managed Care, Other (non HMO)

## 2016-04-02 DIAGNOSIS — Z5181 Encounter for therapeutic drug level monitoring: Secondary | ICD-10-CM

## 2016-04-02 DIAGNOSIS — D6851 Activated protein C resistance: Secondary | ICD-10-CM | POA: Diagnosis not present

## 2016-04-02 LAB — POCT INR: INR: 2

## 2016-04-02 NOTE — Progress Notes (Signed)
I have reviewed and agree with the plan. 

## 2016-04-02 NOTE — Patient Instructions (Signed)
Pre visit review using our clinic review tool, if applicable. No additional management support is needed unless otherwise documented below in the visit note.  INR today 2.0  Patient denies any changes in health, diet or medications and denies missing any doses.  Written instructions given and reviewed today with patient to take 2 pills (15mg ) today (2/28) and tomorrow (3/1) and then take 1 1/2 (11.25mg ) tablets daily EXCEPT for 2 pills (15mg ) on Mondays and Wednesdays, Recheck in 2-3 weeks on 04/18/16.  Also, educated on risks associated with subtherapeutic level and to go to ER if any concerns develop.  Patient verbalizes understanding of all instructions given today.

## 2016-04-08 ENCOUNTER — Ambulatory Visit (INDEPENDENT_AMBULATORY_CARE_PROVIDER_SITE_OTHER): Payer: Managed Care, Other (non HMO) | Admitting: *Deleted

## 2016-04-08 DIAGNOSIS — E538 Deficiency of other specified B group vitamins: Secondary | ICD-10-CM

## 2016-04-08 MED ORDER — CYANOCOBALAMIN 1000 MCG/ML IJ SOLN
1000.0000 ug | INTRAMUSCULAR | Status: DC
  Administered 2016-04-08 – 2019-12-22 (×8): 1000 ug via INTRAMUSCULAR

## 2016-04-18 ENCOUNTER — Ambulatory Visit (INDEPENDENT_AMBULATORY_CARE_PROVIDER_SITE_OTHER): Payer: Managed Care, Other (non HMO) | Admitting: General Practice

## 2016-04-18 DIAGNOSIS — Z5181 Encounter for therapeutic drug level monitoring: Secondary | ICD-10-CM

## 2016-04-18 DIAGNOSIS — D6851 Activated protein C resistance: Secondary | ICD-10-CM

## 2016-04-18 LAB — POCT INR: INR: 2

## 2016-04-18 NOTE — Patient Instructions (Signed)
Pre visit review using our clinic review tool, if applicable. No additional management support is needed unless otherwise documented below in the visit note. 

## 2016-04-18 NOTE — Progress Notes (Signed)
I have reviewed and agree with the plan. 

## 2016-04-25 ENCOUNTER — Other Ambulatory Visit: Payer: Self-pay

## 2016-04-25 MED ORDER — WARFARIN SODIUM 7.5 MG PO TABS: ORAL_TABLET | ORAL | 2 refills | Status: DC

## 2016-04-25 NOTE — Telephone Encounter (Signed)
Patient is [facility] [address].  She is compliant with coumadin checks and med management, will refill X 3months per protocol.

## 2016-04-29 ENCOUNTER — Other Ambulatory Visit: Payer: Self-pay | Admitting: *Deleted

## 2016-04-29 MED ORDER — LEVOTHYROXINE SODIUM 175 MCG PO TABS: 175.0000 ug | ORAL_TABLET | Freq: Every day | ORAL | 0 refills | Status: DC

## 2016-05-09 ENCOUNTER — Ambulatory Visit (INDEPENDENT_AMBULATORY_CARE_PROVIDER_SITE_OTHER): Payer: Managed Care, Other (non HMO) | Admitting: General Practice

## 2016-05-09 DIAGNOSIS — Z5181 Encounter for therapeutic drug level monitoring: Secondary | ICD-10-CM

## 2016-05-09 DIAGNOSIS — D6851 Activated protein C resistance: Secondary | ICD-10-CM

## 2016-05-09 LAB — POCT INR: INR: 2

## 2016-05-09 NOTE — Progress Notes (Signed)
I have reviewed and agree with the plan. 

## 2016-05-09 NOTE — Patient Instructions (Signed)
Pre visit review using our clinic review tool, if applicable. No additional management support is needed unless otherwise documented below in the visit note. 

## 2016-05-13 ENCOUNTER — Ambulatory Visit (INDEPENDENT_AMBULATORY_CARE_PROVIDER_SITE_OTHER): Payer: Managed Care, Other (non HMO)

## 2016-05-13 DIAGNOSIS — E538 Deficiency of other specified B group vitamins: Secondary | ICD-10-CM

## 2016-05-13 MED ORDER — CYANOCOBALAMIN 1000 MCG/ML IJ SOLN
1000.0000 ug | Freq: Once | INTRAMUSCULAR | Status: AC
  Administered 2016-05-13: 1000 ug via INTRAMUSCULAR

## 2016-05-30 ENCOUNTER — Ambulatory Visit (INDEPENDENT_AMBULATORY_CARE_PROVIDER_SITE_OTHER): Payer: Managed Care, Other (non HMO) | Admitting: General Practice

## 2016-05-30 DIAGNOSIS — Z5181 Encounter for therapeutic drug level monitoring: Secondary | ICD-10-CM | POA: Diagnosis not present

## 2016-05-30 DIAGNOSIS — D6851 Activated protein C resistance: Secondary | ICD-10-CM

## 2016-05-30 LAB — POCT INR: INR: 1.9

## 2016-05-30 NOTE — Progress Notes (Signed)
I have reviewed and agree with the plan. 

## 2016-05-30 NOTE — Patient Instructions (Signed)
Pre visit review using our clinic review tool, if applicable. No additional management support is needed unless otherwise documented below in the visit note. 

## 2016-06-12 ENCOUNTER — Ambulatory Visit (INDEPENDENT_AMBULATORY_CARE_PROVIDER_SITE_OTHER): Payer: Managed Care, Other (non HMO) | Admitting: *Deleted

## 2016-06-12 DIAGNOSIS — E538 Deficiency of other specified B group vitamins: Secondary | ICD-10-CM | POA: Diagnosis not present

## 2016-06-13 ENCOUNTER — Ambulatory Visit (INDEPENDENT_AMBULATORY_CARE_PROVIDER_SITE_OTHER): Payer: Managed Care, Other (non HMO) | Admitting: General Practice

## 2016-06-13 DIAGNOSIS — Z5181 Encounter for therapeutic drug level monitoring: Secondary | ICD-10-CM

## 2016-06-13 DIAGNOSIS — D6851 Activated protein C resistance: Secondary | ICD-10-CM

## 2016-06-13 LAB — POCT INR: INR: 4.1

## 2016-06-13 NOTE — Progress Notes (Signed)
I have reviewed and agree with the plan. 

## 2016-06-13 NOTE — Patient Instructions (Signed)
Pre visit review using our clinic review tool, if applicable. No additional management support is needed unless otherwise documented below in the visit note. 

## 2016-07-02 ENCOUNTER — Other Ambulatory Visit: Payer: Self-pay

## 2016-07-02 MED ORDER — WARFARIN SODIUM 7.5 MG PO TABS: ORAL_TABLET | ORAL | 0 refills | Status: DC

## 2016-07-02 NOTE — Telephone Encounter (Signed)
Patient is compliant with coumadin management.  Will refill X 3months per protocol.  Patient is taking 1.5 - 2 pills per day and requires a larger qty than #90 for a 3 month supply.  Will approve for # 180, with no additional refills.

## 2016-07-11 ENCOUNTER — Ambulatory Visit (INDEPENDENT_AMBULATORY_CARE_PROVIDER_SITE_OTHER): Payer: Managed Care, Other (non HMO) | Admitting: General Practice

## 2016-07-11 DIAGNOSIS — D6851 Activated protein C resistance: Secondary | ICD-10-CM

## 2016-07-11 DIAGNOSIS — Z5181 Encounter for therapeutic drug level monitoring: Secondary | ICD-10-CM

## 2016-07-11 LAB — POCT INR: INR: 1.7

## 2016-07-11 NOTE — Patient Instructions (Signed)
Pre visit review using our clinic review tool, if applicable. No additional management support is needed unless otherwise documented below in the visit note. 

## 2016-07-11 NOTE — Progress Notes (Signed)
I have reviewed and agree with the plan. 

## 2016-07-15 ENCOUNTER — Ambulatory Visit: Payer: Managed Care, Other (non HMO)

## 2016-07-23 ENCOUNTER — Ambulatory Visit (INDEPENDENT_AMBULATORY_CARE_PROVIDER_SITE_OTHER): Payer: Managed Care, Other (non HMO) | Admitting: *Deleted

## 2016-07-23 DIAGNOSIS — E538 Deficiency of other specified B group vitamins: Secondary | ICD-10-CM

## 2016-08-08 ENCOUNTER — Ambulatory Visit (INDEPENDENT_AMBULATORY_CARE_PROVIDER_SITE_OTHER): Payer: Managed Care, Other (non HMO) | Admitting: General Practice

## 2016-08-08 DIAGNOSIS — D6851 Activated protein C resistance: Secondary | ICD-10-CM

## 2016-08-08 DIAGNOSIS — Z5181 Encounter for therapeutic drug level monitoring: Secondary | ICD-10-CM

## 2016-08-08 LAB — POCT INR: INR: 3.6

## 2016-08-08 NOTE — Progress Notes (Signed)
I have reviewed and agree with the plan. 

## 2016-08-08 NOTE — Patient Instructions (Signed)
Pre visit review using our clinic review tool, if applicable. No additional management support is needed unless otherwise documented below in the visit note. 

## 2016-08-25 ENCOUNTER — Emergency Department (HOSPITAL_BASED_OUTPATIENT_CLINIC_OR_DEPARTMENT_OTHER): Payer: Managed Care, Other (non HMO)

## 2016-08-25 ENCOUNTER — Telehealth: Payer: Self-pay | Admitting: Family Medicine

## 2016-08-25 ENCOUNTER — Emergency Department (HOSPITAL_BASED_OUTPATIENT_CLINIC_OR_DEPARTMENT_OTHER)
Admission: EM | Admit: 2016-08-25 | Discharge: 2016-08-25 | Disposition: A | Discharge status: Home / Self Care | Payer: Managed Care, Other (non HMO) | Attending: Emergency Medicine | Admitting: Emergency Medicine

## 2016-08-25 ENCOUNTER — Encounter (HOSPITAL_BASED_OUTPATIENT_CLINIC_OR_DEPARTMENT_OTHER): Payer: Self-pay

## 2016-08-25 DIAGNOSIS — M79605 Pain in left leg: Secondary | ICD-10-CM | POA: Diagnosis present

## 2016-08-25 DIAGNOSIS — N182 Chronic kidney disease, stage 2 (mild): Secondary | ICD-10-CM | POA: Insufficient documentation

## 2016-08-25 DIAGNOSIS — R233 Spontaneous ecchymoses: Secondary | ICD-10-CM | POA: Diagnosis not present

## 2016-08-25 DIAGNOSIS — Z87891 Personal history of nicotine dependence: Secondary | ICD-10-CM | POA: Insufficient documentation

## 2016-08-25 DIAGNOSIS — Z7901 Long term (current) use of anticoagulants: Secondary | ICD-10-CM | POA: Insufficient documentation

## 2016-08-25 DIAGNOSIS — Z86718 Personal history of other venous thrombosis and embolism: Secondary | ICD-10-CM | POA: Diagnosis not present

## 2016-08-25 DIAGNOSIS — E039 Hypothyroidism, unspecified: Secondary | ICD-10-CM | POA: Diagnosis not present

## 2016-08-25 DIAGNOSIS — I129 Hypertensive chronic kidney disease with stage 1 through stage 4 chronic kidney disease, or unspecified chronic kidney disease: Secondary | ICD-10-CM | POA: Diagnosis not present

## 2016-08-25 DIAGNOSIS — D682 Hereditary deficiency of other clotting factors: Secondary | ICD-10-CM | POA: Diagnosis not present

## 2016-08-25 DIAGNOSIS — R238 Other skin changes: Secondary | ICD-10-CM

## 2016-08-25 HISTORY — DX: Acute embolism and thrombosis of unspecified deep veins of unspecified lower extremity: I82.409

## 2016-08-25 LAB — CBC WITH DIFFERENTIAL/PLATELET
BASOS ABS: 0 10*3/uL (ref 0.0–0.1)
BASOS PCT: 1 %
EOS ABS: 0.3 10*3/uL (ref 0.0–0.7)
Eosinophils Relative: 8 %
HCT: 36.6 % (ref 36.0–46.0)
Hemoglobin: 11.7 g/dL — ABNORMAL LOW (ref 12.0–15.0)
LYMPHS PCT: 33 %
Lymphs Abs: 1.4 10*3/uL (ref 0.7–4.0)
MCH: 27.5 pg (ref 26.0–34.0)
MCHC: 32 g/dL (ref 30.0–36.0)
MCV: 86.1 fL (ref 78.0–100.0)
Monocytes Absolute: 0.3 10*3/uL (ref 0.1–1.0)
Monocytes Relative: 7 %
Neutro Abs: 2.1 10*3/uL (ref 1.7–7.7)
Neutrophils Relative %: 51 %
PLATELETS: 164 10*3/uL (ref 150–400)
RBC: 4.25 MIL/uL (ref 3.87–5.11)
RDW: 14.9 % (ref 11.5–15.5)
WBC: 4.1 10*3/uL (ref 4.0–10.5)

## 2016-08-25 LAB — COMPREHENSIVE METABOLIC PANEL
ALT: 19 U/L (ref 14–54)
AST: 25 U/L (ref 15–41)
Albumin: 3.8 g/dL (ref 3.5–5.0)
Alkaline Phosphatase: 74 U/L (ref 38–126)
Anion gap: 7 (ref 5–15)
BUN: 12 mg/dL (ref 6–20)
CHLORIDE: 105 mmol/L (ref 101–111)
CO2: 26 mmol/L (ref 22–32)
CREATININE: 0.97 mg/dL (ref 0.44–1.00)
Calcium: 8.5 mg/dL — ABNORMAL LOW (ref 8.9–10.3)
Glucose, Bld: 94 mg/dL (ref 65–99)
POTASSIUM: 4.4 mmol/L (ref 3.5–5.1)
Sodium: 138 mmol/L (ref 135–145)
TOTAL PROTEIN: 7.3 g/dL (ref 6.5–8.1)
Total Bilirubin: 0.5 mg/dL (ref 0.3–1.2)

## 2016-08-25 LAB — PROTIME-INR
INR: 3.08
PROTHROMBIN TIME: 32.5 s — AB (ref 11.4–15.2)

## 2016-08-25 NOTE — Telephone Encounter (Signed)
[facility] - Client TELEPHONE ADVICE RECORD [facility] Patient Name: [name] DOB: 03/28/1965 Initial Comment Caller says, she has lupis and blood clotting, She has a bruise on he calf near her knee. Feels some swelling and tender to touch. Nurse Assessment Nurse: Lane HackerHarley, RN, Elvin SoWindy Date/Time (Eastern Time): 08/25/2016 9:07:19 AM Confirm and document reason for call. If symptomatic, describe symptoms. ---Caller states that she has a bruise on he left calf near her knee. Swelling about size of grapefruit, and tender to touch. Noticed Friday with color change, and slowly swelling progressed later on Saturday. Does the patient have any new or worsening symptoms? ---Yes Will a triage be completed? ---Yes Related visit to physician within the last 2 weeks? ---No Does the PT have any chronic conditions? (i.e. diabetes, asthma, etc.) ---Yes List chronic conditions. ---lupus, DVT (25 yrs ago after childbirth), and phlebitis on/off, blood clotting disorder Is the patient pregnant or possibly pregnant? (Ask all females between the ages of 3212-55) ---No Is this a behavioral health or substance abuse call? ---No Guidelines Guideline Title Affirmed Question Affirmed Notes Bruises [1] Raised bruise AND [2] size > 2 inches (5 cm) AND [3] expanding Final Disposition User See Physician within 4 Hours (or PCP triage) Lane HackerHarley, RN, Elvin SoWindy Comments [place] was [place], arrived Thursday. and 2 wks [place] that [place] [facility] [place] [facility] [place]. She is on Coumadin, and has had some nosebleeds off/on in the last month also. INR was checked about 2 wks, and it was high at 3.9. Decreased her dose by holding that day, and then adjusting the daily dose. Scheduled to recheck for August 3. Last Wednesday was the last nose bleed. [facility], and caller was willing to go to [facility], however, appts booked there  also. She will go on to ER given s/s. Referrals REFERRED TO PCP OFFICE Disagree/Comply: Comply

## 2016-08-25 NOTE — Discharge Instructions (Signed)
Read the information below.  You may return to the Emergency Department at any time for worsening condition or any new symptoms that concern you.   If you develop additional abnormal bleeding, or you feel weak, short of breath, or have severe leg or chest pain, please go to the nearest emergency department for evaluation.

## 2016-08-25 NOTE — ED Triage Notes (Signed)
C/o swelling, bruising to left LE x 4 days-hx of DVT and recent travel-NAD-steady gait

## 2016-08-25 NOTE — Telephone Encounter (Signed)
I agree with advisement to go to ED -will look for records as they come

## 2016-08-25 NOTE — ED Provider Notes (Signed)
[facility]   CSN: [id number] Arrival date & time: 08/25/16  1504     History   Chief Complaint Chief Complaint  Patient presents with  . Leg Pain    HPI [name] is a 51 y.o. female.  HPI   Pt with hx lupus, Factor V Leiden, DVT on coumadin p/w left leg swelling and abnormal bruising that she noticed a few days ago.  States she has been traveling to [facility] and [facility], had a few nosebleeds while she was there that she attributed to dry air.  When she returned she noticed this bruising without any known trauma to the area.  She then noticed more bruises on her anterior left thigh.  Last INR was several weeks ago was elevated.   Denies fevers, recent illness, CP, SOB, any other sources of abnormal bleeding.   Past Medical History:  Diagnosis Date  . Anxiety state, unspecified    panic attacks  . Arthritis    Lupus - hands/knees  . DVT (deep venous thrombosis) (HCC)   . Edema   . 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 GERD (gastroesophageal reflux disease)    on prilosec r/t gastric sleeve surgery  . HLD (hyperlipidemia)   . Iron deficiency anemia    IV dextran [place]  . Nephritis and nephropathy, not specified as acute or chronic, with unspecified pathological lesion in kidney   . Nonspecific abnormal results of liver function study   . Obesity, unspecified   . Other pulmonary embolism and infarction   . Other specified acquired hypothyroidism   . Panic disorder without agoraphobia   . Personal history of venous thrombosis and embolism 2002   during pregnancy; ?factor 5 leiden (sees heme)  . Pleural effusion 2005   c/w lupus initial w/u; recurrent Right as pred tapered off July 2011  . Pure hypercholesterolemia   . Sleep apnea   . 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  Unspecified essential hypertension   . Unspecified pleural effusion     Patient Active Problem List   Diagnosis Date Noted  . Viral syndrome 02/22/2016  . Vitamin B12 deficiency 11/15/2015  . Muscle cramps 11/14/2015  . Nausea with vomiting 10/15/2015  . Allergic rhinitis 07/01/2015  . History of cerebral hemorrhage 07/01/2015  . Headache 06/29/2015  . Screening for osteoporosis 11/04/2013  . Headache(784.0) 10/25/2013  . Pain, eye, right 10/25/2013  . Encounter for therapeutic drug monitoring 03/25/2013  . CKD (chronic kidney disease) stage 2, GFR 60-89 ml/min 12/20/2012  . 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 Routine general medical examination at a health care facility 12/07/2012  . Steroid long-term use 12/07/2012  . History of HPV infection 12/07/2012  . Long term (current) use of anticoagulants 10/06/2012  . Chronic anticoagulation 04/22/2012  . S/P bariatric surgery 04/22/2012  . Factor V Leiden (HCC) 04/22/2012  . Urine abnormality 04/16/2012  . Neck pain 02/23/2012  . OSA on CPAP 11/20/2011  . Hyperglycemia 09/16/2010  . EDEMA 01/08/2010  . PLEURAL EFFUSION 08/27/2009  . ENLARGEMENT OF LYMPH NODES 08/13/2009  . Hx of PE 05/24/2009  . Obesity 12/08/2007  . Hypothyroidism 06/09/2006  . HYPERCHOLESTEROLEMIA 06/09/2006  . ANXIETY 06/09/2006  . PANIC DISORDER 06/09/2006  . Essential hypertension 06/09/2006  . GLOMERULONEPHRITIS 06/09/2006  .  Systemic lupus erythematosus (HCC) 06/09/2006  . DVT, HX OF 06/09/2006    Past Surgical History:  Procedure Laterality Date  . gastric sleeve  8/12   bariatric surgery Dr Adolphus Birchwood   . LAPAROSCOPIC TUBAL LIGATION  12/09/2010   Procedure: LAPAROSCOPIC TUBAL LIGATION;  Surgeon: Juluis Mire;  Location: [facility];  Service: Gynecology;  Laterality: Bilateral;  Attempted see Nursing note  . pleurocentesis  10/2004  . Pleuryx catheter placement  9/11    VanTright----removed 9/11  . RENAL BIOPSY  09/24/2012  . svd     x 3  .  THORACENTESIS  2010   with penumonia  . WISDOM TOOTH EXTRACTION      OB History    No data available       Home Medications    Prior to Admission medications   Medication Sig Start Date End Date Taking? Authorizing Provider  BIOTIN PO Take 1 capsule by mouth daily.    [provider]  Cyanocobalamin (VITAMIN B-12 PO) Take 1 capsule by mouth daily.    [provider]  cyclobenzaprine (FLEXERIL) 5 MG tablet Take 0.5-1 tablets (2.5-5 mg total) by mouth 3 (three) times daily as needed for muscle spasms (headache). Caution of sedation 10/15/15   Tower, Audrie Gallus, MD  IRON PO Take 1 tablet by mouth daily.    [provider]  labetalol (NORMODYNE) 200 MG tablet Take 1 and 1/2 tablets  by mouth twice a day 12/18/15   Tower, Audrie Gallus, MD  levothyroxine (SYNTHROID, LEVOTHROID) 175 MCG tablet Take 1 tablet (175 mcg total) by mouth daily. 04/29/16   Tower, Audrie Gallus, MD  Multiple Vitamin (MULTIVITAMIN) tablet Take 1 tablet by mouth daily.    [provider]  omeprazole (PRILOSEC) 20 MG capsule TAKE 1 CAPSULE (20 MG TOTAL) BY MOUTH 2 (TWO) TIMES DAILY BEFORE A MEAL. Patient taking differently: Take 20 mg by mouth daily.  01/08/16   Tower, Audrie Gallus, MD  PARoxetine (PAXIL) 40 MG tablet TAKE 1 TABLET (40 MG TOTAL) BY MOUTH EVERY MORNING. 01/08/16   Tower, Audrie Gallus, MD  rosuvastatin (CRESTOR) 20 MG tablet Take 1 tablet (20 mg total) by mouth daily. 02/15/16 05/15/16  Pricilla Riffle, MD  warfarin (COUMADIN) 7.5 MG tablet Take as directed by anticoagulation clinic 07/02/16   Tower, Audrie Gallus, MD    Family History Family History  Problem Relation Age of Onset  . Lupus Sister   . Hypertension Father   . Hyperlipidemia Father   . Leukemia Sister   . Diabetes Unknown        GM    Social History Social History  Substance Use Topics  . Smoking status: Former Smoker    Packs/day: 0.50    Years: 10.00    Types: Cigarettes    Quit date: 05/04/2009  . Smokeless tobacco: Never Used      Comment: Socially x10 years (1 pack/month)  . Alcohol use 0.0 oz/week     Comment: rare     Allergies   Lisinopril; Lovenox [enoxaparin sodium]; Moxifloxacin; and Amoxicillin   Review of Systems Review of Systems  All other systems reviewed and are negative.    Physical Exam Updated Vital Signs BP (!) 147/88 (BP Location: Left Arm)   Pulse 64   Temp 97.9 F (36.6 C) (Oral)   Resp 18   Ht 5\' 5"  (1.651 m)   Wt 129.7 kg (286 lb)   SpO2 100%   BMI 47.59 kg/m   Physical Exam  Constitutional: She appears well-developed and well-nourished. No distress.  HENT:  Head: Normocephalic and atraumatic.  Neck: Neck supple.  Cardiovascular: Normal rate and regular rhythm.   Pulmonary/Chest: Effort normal and breath sounds normal. No respiratory distress. She has no wheezes. She has no rales.  Abdominal: Soft. She exhibits no distension. There is no tenderness. There is no rebound and no guarding.  Musculoskeletal:  Lower extremities are large with scattered ecchymosis over left anterior thigh and left medial superior calf.  Compartments soft.  Distal pitting edema bilaterally is symmetric.  Distal pulses intact.    Neurological: She is alert.  Skin: She is not diaphoretic.  Nursing note and vitals reviewed.    ED Treatments / Results  Labs (all labs ordered are listed, but only abnormal results are displayed) Labs Reviewed  CBC WITH DIFFERENTIAL/PLATELET - Abnormal; Notable for the following:       Result Value   Hemoglobin 11.7 (*)    All other components within normal limits  COMPREHENSIVE METABOLIC PANEL - Abnormal; Notable for the following:    Calcium 8.5 (*)    All other components within normal limits  PROTIME-INR - Abnormal; Notable for the following:    Prothrombin Time 32.5 (*)    All other components within normal limits    EKG  EKG Interpretation None       Radiology US Venous Img Lower Unilateral Left  Result Date: 08/25/2016 CLINICAL DATA:   51 year old with factor 5 Leiden mutation for which the patient is chronically anticoagulated, presenting with edema, erythema and bruising involving the left lower leg. EXAM: LEFT LOWER EXTREMITY VENOUS DOPPLER ULTRASOUND TECHNIQUE: Gray-scale sonography with graded compression, as well as color Doppler and duplex ultrasound were performed to evaluate the lower extremity deep venous systems from the level of the common femoral vein and including the common femoral, femoral, profunda femoral, popliteal and calf veins including the posterior tibial, peroneal and gastrocnemius veins when visible. The superficial great saphenous vein was also interrogated. Spectral Doppler was utilized to evaluate flow at rest and with distal augmentation maneuvers in the common femoral, femoral and popliteal veins. COMPARISON:  None. FINDINGS: Contralateral Common Right Femoral Vein: Respiratory phasicity is normal and symmetric with the symptomatic side. No evidence of thrombus. Normal compressibility. Left Common Femoral Vein: No evidence of thrombus. Normal compressibility, respiratory phasicity and response to augmentation. Saphenofemoral Junction: No evidence of thrombus. Normal compressibility and flow on color Doppler imaging. Profunda Femoral Vein: No evidence of thrombus. Normal compressibility and flow on color Doppler imaging. Femoral Vein: No evidence of thrombus. Normal compressibility, respiratory phasicity and response to augmentation. Popliteal Vein: No evidence of thrombus. Normal compressibility, respiratory phasicity and response to augmentation. Calf Veins: No evidence of thrombus. Normal compressibility and flow on color Doppler imaging. Superficial Great Saphenous Vein: No evidence of thrombus. Normal compressibility and flow on color Doppler imaging. Venous Reflux:  Not evaluated. Other Findings: No evidence of hematoma in the area of bruising in the medial upper left calf. IMPRESSION: No evidence of DVT  involving the left lower extremity. Electronically Signed   By: Hulan Saas M.D.   On: 08/25/2016 16:49    Procedures Procedures (including critical care time)  Medications Ordered in ED Medications - No data to display   Initial Impression / Assessment and Plan / ED Course  I have reviewed the triage vital signs and the nursing notes.  Pertinent labs & imaging results that were available during my care of the patient were reviewed by me  and considered in my medical decision making (see chart for details).     Afebrile nontoxic patient with Factor V Leiden and lupus, hx DVT on coumadin p/w left leg bruising with associated swelling.  Clinically pt has single bruise/hematoma left medial thigh and few scattered bruises of anterior thigh on the same leg.  Pt doesn't remember any trauma but has been very active recently.  INR is 3, pt's goal is 2.5-3.5.  Hgb is stable, platelets are normal.  US venous of LLE negative.  D/C home with PCP follow up.  Discussed result, findings, treatment, and follow up  with patient.  Pt given return precautions.  Pt verbalizes understanding and agrees with plan.      Final Clinical Impressions(s) / ED Diagnoses   Final diagnoses:  Abnormal bruising  Anticoagulated on Coumadin    New Prescriptions New Prescriptions   No medications on file     Trixie DredgeWest, Brayten Komar, [place]C 08/25/16 1658    Rolan BuccoBelfi, Melanie, MD 08/25/16 1714

## 2016-08-25 NOTE — ED Notes (Signed)
Patient transported to Ultrasound 

## 2016-08-27 ENCOUNTER — Ambulatory Visit (INDEPENDENT_AMBULATORY_CARE_PROVIDER_SITE_OTHER): Payer: Managed Care, Other (non HMO)

## 2016-08-27 DIAGNOSIS — E538 Deficiency of other specified B group vitamins: Secondary | ICD-10-CM

## 2016-09-05 ENCOUNTER — Ambulatory Visit (INDEPENDENT_AMBULATORY_CARE_PROVIDER_SITE_OTHER): Payer: Managed Care, Other (non HMO) | Admitting: General Practice

## 2016-09-05 LAB — POCT INR: INR: 4.6

## 2016-09-05 NOTE — Progress Notes (Signed)
I have reviewed and agree with the plan. 

## 2016-09-05 NOTE — Patient Instructions (Signed)
Pre visit review using our clinic review tool, if applicable. No additional management support is needed unless otherwise documented below in the visit note. 

## 2016-09-07 ENCOUNTER — Other Ambulatory Visit: Payer: Self-pay | Admitting: Family Medicine

## 2016-09-12 ENCOUNTER — Other Ambulatory Visit: Payer: Self-pay | Admitting: Family Medicine

## 2016-09-12 NOTE — Telephone Encounter (Signed)
Please schedule October f/u and refill until then  

## 2016-09-12 NOTE — Telephone Encounter (Signed)
No recent/future appts., and last TSH was 11/2015, please advise

## 2016-09-12 NOTE — Telephone Encounter (Signed)
Left voicemail requesting pt to call the office back 

## 2016-09-16 ENCOUNTER — Other Ambulatory Visit: Payer: Self-pay

## 2016-09-16 NOTE — Telephone Encounter (Signed)
Error see 09/12/16 phone note.

## 2016-09-16 NOTE — Telephone Encounter (Signed)
Tried to call pt again to schedule appt and no answer so left 2nd voicemail requesting pt to call the office back. Also left voicemail with pharmacy letting them know we can't fill med until pt calls back and schedules an f/u

## 2016-09-16 NOTE — Telephone Encounter (Signed)
[facility] left [facility]/m requesting status of levothyroxine refill.

## 2016-09-18 NOTE — Telephone Encounter (Signed)
Called pt and still no answer so Rx declined and pharmacy advised pt needs f/u appt before we can fill med

## 2016-09-19 ENCOUNTER — Ambulatory Visit (INDEPENDENT_AMBULATORY_CARE_PROVIDER_SITE_OTHER): Payer: Managed Care, Other (non HMO) | Admitting: General Practice

## 2016-09-19 DIAGNOSIS — Z5181 Encounter for therapeutic drug level monitoring: Secondary | ICD-10-CM

## 2016-09-19 DIAGNOSIS — D6851 Activated protein C resistance: Secondary | ICD-10-CM

## 2016-09-19 LAB — POCT INR: INR: 3

## 2016-09-19 MED ORDER — LEVOTHYROXINE SODIUM 175 MCG PO TABS: 175.0000 ug | ORAL_TABLET | Freq: Every day | ORAL | 0 refills | Status: DC

## 2016-09-19 NOTE — Telephone Encounter (Signed)
Pt called back and scheduled appt, med refilled

## 2016-09-19 NOTE — Progress Notes (Signed)
I have reviewed and agree with the plan. 

## 2016-09-19 NOTE — Addendum Note (Signed)
Addended by: Shon Millet on: 09/19/2016 02:09 PM   Modules accepted: Orders

## 2016-09-19 NOTE — Patient Instructions (Signed)
Pre visit review using our clinic review tool, if applicable. No additional management support is needed unless otherwise documented below in the visit note. 

## 2016-09-30 ENCOUNTER — Other Ambulatory Visit: Payer: Self-pay | Admitting: Family Medicine

## 2016-09-30 ENCOUNTER — Other Ambulatory Visit: Payer: Self-pay | Admitting: General Practice

## 2016-09-30 MED ORDER — WARFARIN SODIUM 7.5 MG PO TABS: ORAL_TABLET | ORAL | 0 refills | Status: DC

## 2016-09-30 NOTE — Telephone Encounter (Signed)
Rout to [name] since she manages pt's coumadin

## 2016-10-01 ENCOUNTER — Other Ambulatory Visit: Payer: Self-pay | Admitting: Family Medicine

## 2016-10-01 ENCOUNTER — Ambulatory Visit: Payer: Managed Care, Other (non HMO)

## 2016-10-01 NOTE — Telephone Encounter (Signed)
Rout to pt's Coumadin clinic nurse

## 2016-10-03 ENCOUNTER — Encounter: Payer: Self-pay | Admitting: Family Medicine

## 2016-10-03 ENCOUNTER — Ambulatory Visit (INDEPENDENT_AMBULATORY_CARE_PROVIDER_SITE_OTHER): Payer: Managed Care, Other (non HMO) | Admitting: Family Medicine

## 2016-10-03 VITALS — BP 110/68 | HR 80 | Temp 97.8°F | Ht 65.0 in | Wt 290.8 lb

## 2016-10-03 DIAGNOSIS — E7800 Pure hypercholesterolemia, unspecified: Secondary | ICD-10-CM

## 2016-10-03 DIAGNOSIS — I10 Essential (primary) hypertension: Secondary | ICD-10-CM

## 2016-10-03 DIAGNOSIS — R739 Hyperglycemia, unspecified: Secondary | ICD-10-CM | POA: Diagnosis not present

## 2016-10-03 DIAGNOSIS — E538 Deficiency of other specified B group vitamins: Secondary | ICD-10-CM | POA: Diagnosis not present

## 2016-10-03 DIAGNOSIS — E038 Other specified hypothyroidism: Secondary | ICD-10-CM | POA: Diagnosis not present

## 2016-10-03 DIAGNOSIS — M3213 Lung involvement in systemic lupus erythematosus: Secondary | ICD-10-CM | POA: Diagnosis not present

## 2016-10-03 LAB — CBC WITH DIFFERENTIAL/PLATELET
BASOS ABS: 0.1 10*3/uL (ref 0.0–0.1)
Basophils Relative: 1.2 % (ref 0.0–3.0)
EOS ABS: 0.3 10*3/uL (ref 0.0–0.7)
Eosinophils Relative: 6.6 % — ABNORMAL HIGH (ref 0.0–5.0)
HCT: 36.9 % (ref 36.0–46.0)
Hemoglobin: 11.6 g/dL — ABNORMAL LOW (ref 12.0–15.0)
LYMPHS ABS: 1.3 10*3/uL (ref 0.7–4.0)
Lymphocytes Relative: 29.8 % (ref 12.0–46.0)
MCHC: 31.4 g/dL (ref 30.0–36.0)
MCV: 84.6 fl (ref 78.0–100.0)
MONOS PCT: 6.9 % (ref 3.0–12.0)
Monocytes Absolute: 0.3 10*3/uL (ref 0.1–1.0)
NEUTROS ABS: 2.4 10*3/uL (ref 1.4–7.7)
NEUTROS PCT: 55.5 % (ref 43.0–77.0)
PLATELETS: 184 10*3/uL (ref 150.0–400.0)
RBC: 4.36 Mil/uL (ref 3.87–5.11)
RDW: 16.6 % — AB (ref 11.5–15.5)
WBC: 4.2 10*3/uL (ref 4.0–10.5)

## 2016-10-03 LAB — COMPREHENSIVE METABOLIC PANEL
ALT: 18 U/L (ref 0–35)
AST: 20 U/L (ref 0–37)
Albumin: 3.9 g/dL (ref 3.5–5.2)
Alkaline Phosphatase: 71 U/L (ref 39–117)
BILIRUBIN TOTAL: 0.3 mg/dL (ref 0.2–1.2)
BUN: 11 mg/dL (ref 6–23)
CHLORIDE: 104 meq/L (ref 96–112)
CO2: 30 meq/L (ref 19–32)
Calcium: 9.1 mg/dL (ref 8.4–10.5)
Creatinine, Ser: 1.01 mg/dL (ref 0.40–1.20)
GFR: 61.62 mL/min (ref >60.00)
GLUCOSE: 84 mg/dL (ref 70–99)
Potassium: 4.2 mEq/L (ref 3.5–5.1)
Sodium: 138 mEq/L (ref 135–145)
Total Protein: 7 g/dL (ref 6.0–8.3)

## 2016-10-03 LAB — LIPID PANEL
CHOL/HDL RATIO: 4
Cholesterol: 185 mg/dL (ref 0–200)
HDL: 48 mg/dL (ref >39.00)
LDL CALC: 116 mg/dL — AB (ref 0–99)
NONHDL: 136.77
Triglycerides: 106 mg/dL (ref 0.0–149.0)
VLDL: 21.2 mg/dL (ref 0.0–40.0)

## 2016-10-03 LAB — HEMOGLOBIN A1C: Hgb A1c MFr Bld: 5.6 % (ref 4.6–6.5)

## 2016-10-03 LAB — TSH: TSH: 0.46 u[IU]/mL (ref 0.35–4.50)

## 2016-10-03 MED ORDER — PAROXETINE HCL 40 MG PO TABS: 40.0000 mg | ORAL_TABLET | Freq: Every morning | ORAL | 3 refills | Status: DC

## 2016-10-03 MED ORDER — CYANOCOBALAMIN 1000 MCG/ML IJ SOLN
1000.0000 ug | Freq: Once | INTRAMUSCULAR | Status: AC
  Administered 2016-10-03: 1000 ug via INTRAMUSCULAR

## 2016-10-03 MED ORDER — OMEPRAZOLE 20 MG PO CPDR: 20.0000 mg | DELAYED_RELEASE_CAPSULE | Freq: Every day | ORAL | 3 refills | Status: DC

## 2016-10-03 MED ORDER — LABETALOL HCL 200 MG PO TABS: ORAL_TABLET | ORAL | 3 refills | Status: DC

## 2016-10-03 MED ORDER — LEVOTHYROXINE SODIUM 175 MCG PO TABS: 175.0000 ug | ORAL_TABLET | Freq: Every day | ORAL | 3 refills | Status: DC

## 2016-10-03 NOTE — Progress Notes (Signed)
Subjective:    Patient ID: [name], female    DOB: [birth year], 51 y.o.   MRN: [id number]  HPI Here for f/u of chronic medical problems   Larey Seat 3 weeks ago at her house (had a big hematoma and went to ED) Hurt her L foot- plans to f/u with podiatry  Good summer -finally built a house  Son moved out today  Daughter just got drivers license    Also O96 injection (feeling tired)   Wt Readings from Last 3 Encounters:  10/03/16 290 lb 12 oz (131.9 kg)  08/25/16 286 lb (129.7 kg)  02/22/16 283 lb 8 oz (128.6 kg)  trying to take good care of herself  48.38 kg/m   bp is stable today  No cp or palpitations or headaches or edema  No side effects to medicines  BP Readings from Last 3 Encounters:  10/03/16 110/68  08/25/16 133/76  02/22/16 102/76     Takes labetalol  Hypothyroidism  Pt has no clinical changes No change in energy level/ hair or skin/ edema and no tremor Lab Results  Component Value Date   TSH 0.82 11/14/2015    Due for tsh On levothyroxine 175 mcg   B12 def Takes orally daily  B12 shot every 30 days   Hx of SLE with lung and kidney involvement  Seeing a new rheumatologist - Dr Nickola Major  On plaquenil Overall stable     Hx of hyperglycemia Lab Results  Component Value Date   HGBA1C 5.8 11/14/2015   Due for labs   Hyperlipidemia Lab Results  Component Value Date   CHOL 201 (H) 11/14/2015   HDL 43.50 11/14/2015   LDLCALC 137 (H) 11/14/2015   LDLDIRECT 203.7 06/03/2011   TRIG 105.0 11/14/2015   CHOLHDL 5 11/14/2015   On crestor and diet   Chronic warfarin for hx of dvt and hypercoag state Lab Results  Component Value Date   INR 3.0 09/19/2016   INR 4.6 09/05/2016   INR 3.08 08/25/2016   PROTIME 15.6 (H) 12/06/2012   PROTIME 19.2 (H) 06/03/2012    Gets a mammogram at the end of sept   Patient Active Problem List   Diagnosis Date Noted  . Vitamin B12 deficiency 11/15/2015  . Muscle cramps 11/14/2015  . Allergic rhinitis  07/01/2015  . History of cerebral hemorrhage 07/01/2015  . Headache 06/29/2015  . Screening for osteoporosis 11/04/2013  . Headache(784.0) 10/25/2013  . Pain, eye, right 10/25/2013  . Encounter for therapeutic drug monitoring 03/25/2013  . CKD (chronic kidney disease) stage 2, GFR 60-89 ml/min 12/20/2012  . 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 Routine general medical examination at a health care facility 12/07/2012  . Steroid long-term use 12/07/2012  . History of HPV infection 12/07/2012  . Long term (current) use of anticoagulants 10/06/2012  . Chronic anticoagulation 04/22/2012  . S/P bariatric surgery 04/22/2012  . Factor V Leiden (HCC) 04/22/2012  . Urine abnormality 04/16/2012  . Neck pain 02/23/2012  . OSA on CPAP 11/20/2011  . Hyperglycemia 09/16/2010  . EDEMA 01/08/2010  . PLEURAL EFFUSION 08/27/2009  . ENLARGEMENT OF LYMPH NODES 08/13/2009  . Hx of PE 05/24/2009  . Morbid obesity (HCC) 12/08/2007  . Hypothyroidism 06/09/2006  . HYPERCHOLESTEROLEMIA 06/09/2006  . ANXIETY 06/09/2006  . PANIC DISORDER 06/09/2006  . Essential hypertension 06/09/2006  . GLOMERULONEPHRITIS 06/09/2006  . Systemic lupus erythematosus (HCC) 06/09/2006  . DVT, HX OF 06/09/2006   Past Medical History:  Diagnosis Date  .  Anxiety state, unspecified    panic attacks  . Arthritis    Lupus - hands/knees  . DVT (deep venous thrombosis) (HCC)   . Edema   . 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 GERD (gastroesophageal reflux disease)    on prilosec r/t gastric sleeve surgery  . HLD (hyperlipidemia)   . Iron deficiency anemia    IV dextran [place]  . Nephritis and nephropathy, not specified as acute or chronic, with unspecified pathological lesion in kidney   . Nonspecific abnormal results of liver function study   . Obesity, unspecified   . Other pulmonary embolism and infarction    . Other specified acquired hypothyroidism   . Panic disorder without agoraphobia   . Personal history of venous thrombosis and embolism 2002   during pregnancy; ?factor 5 leiden (sees heme)  . Pleural effusion 2005   c/w lupus initial w/u; recurrent Right as pred tapered off July 2011  . Pure hypercholesterolemia   . Sleep apnea   . 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 Unspecified essential hypertension   . Unspecified pleural effusion    Past Surgical History:  Procedure Laterality Date  . gastric sleeve  8/12   bariatric surgery Dr Adolphus Birchwoodasher   . LAPAROSCOPIC TUBAL LIGATION  12/09/2010   Procedure: LAPAROSCOPIC TUBAL LIGATION;  Surgeon: Juluis MireJohn S McComb;  Location: [facility];  Service: Gynecology;  Laterality: Bilateral;  Attempted see Nursing note  . pleurocentesis  10/2004  . Pleuryx catheter placement  9/11    VanTright----removed 9/11  . RENAL BIOPSY  09/24/2012  . svd     x 3  . THORACENTESIS  2010   with penumonia  . WISDOM TOOTH EXTRACTION     Social History  Substance Use Topics  . Smoking status: Former Smoker    Packs/day: 0.50    Years: 10.00    Types: Cigarettes    Quit date: 05/04/2009  . Smokeless tobacco: Never Used     Comment: Socially x10 years (1 pack/month)  . Alcohol use 0.0 oz/week     Comment: rare   Family History  Problem Relation Age of Onset  . Lupus Sister   . Hypertension Father   . Hyperlipidemia Father   . Leukemia Sister   . Diabetes Unknown        GM   Allergies  Allergen Reactions  . Lisinopril Cough  . Lovenox [Enoxaparin Sodium] Itching  . Moxifloxacin Other (See Comments)    REACTION: hallucinations  . Amoxicillin Other (See Comments)    unknown   Current Outpatient Prescriptions on File Prior to Visit  Medication Sig Dispense Refill  . BIOTIN PO Take 1 capsule by mouth daily.    . Cyanocobalamin (VITAMIN B-12 PO) Take 1 capsule by mouth daily.    . cyclobenzaprine (FLEXERIL) 5  MG tablet Take 0.5-1 tablets (2.5-5 mg total) by mouth 3 (three) times daily as needed for muscle spasms (headache). Caution of sedation 90 tablet 1  . IRON PO Take 1 tablet by mouth daily.    . Multiple Vitamin (MULTIVITAMIN) tablet Take 1 tablet by mouth daily.    Marland Kitchen. warfarin (COUMADIN) 7.5 MG tablet Take as directed by anticoagulation clinic 180 tablet 0  . rosuvastatin (CRESTOR) 20 MG tablet Take 1 tablet (20 mg total) by mouth daily. 90 tablet  3   Current Facility-Administered Medications on File Prior to Visit  Medication Dose Route Frequency Provider Last Rate Last Dose  . cyanocobalamin ((VITAMIN B-12)) injection 1,000 mcg  1,000 mcg Intramuscular Q30 days Tower, Audrie Gallus, MD   1,000 mcg at 08/27/16 9147    Review of Systems Review of Systems  Constitutional: Negative for fever, appetite change, fatigue and unexpected weight change.  Eyes: Negative for pain and visual disturbance.  Respiratory: Negative for cough and shortness of breath.   Cardiovascular: Negative for cp or palpitations    Gastrointestinal: Negative for nausea, diarrhea and constipation.  Genitourinary: Negative for urgency and frequency.  Skin: Negative for pallor or rash   Neurological: Negative for weakness, light-headedness, numbness and headaches.  Hematological: Negative for adenopathy. Does not bruise/bleed easily.  Psychiatric/Behavioral: Negative for dysphoric mood. The patient is not nervous/anxious.         Objective:   Physical Exam  Constitutional: She appears well-developed and well-nourished. No distress.  obese and well appearing   HENT:  Head: Normocephalic and atraumatic.  Mouth/Throat: Oropharynx is clear and moist.  Eyes: Pupils are equal, round, and reactive to light. Conjunctivae and EOM are normal.  Neck: Normal range of motion. Neck supple. No JVD present. Carotid bruit is not present. No thyromegaly present.  Cardiovascular: Normal rate, regular rhythm, normal heart sounds and intact  distal pulses.  Exam reveals no gallop.   Pulmonary/Chest: Effort normal and breath sounds normal. No respiratory distress. She has no wheezes. She has no rales.  No crackles  Abdominal: Soft. Bowel sounds are normal. She exhibits no distension, no abdominal bruit and no mass. There is no tenderness.  Musculoskeletal: She exhibits no edema.  Lymphadenopathy:    She has no cervical adenopathy.  Neurological: She is alert. She has normal reflexes.  Skin: Skin is warm and dry. No rash noted.  Baseline vascular discoloration of lower legs   Psychiatric: She has a normal mood and affect.          Problem List Items Addressed This Visit      Cardiovascular and Mediastinum   Essential hypertension - Primary    bp in fair control at this time  BP Readings from Last 1 Encounters:  10/03/16 110/68   No changes needed Disc lifstyle change with low sodium diet and exercise  Labs ordered  Labetalol refilled       Relevant Medications   labetalol (NORMODYNE) 200 MG tablet   Other Relevant Orders   CBC with Differential/Platelet (Completed)   Comprehensive metabolic panel (Completed)   Lipid panel (Completed)   TSH (Completed)     Endocrine   Hypothyroidism    Due for TSH Some fatigue No other clinical changes       Relevant Medications   levothyroxine (SYNTHROID, LEVOTHROID) 175 MCG tablet   labetalol (NORMODYNE) 200 MG tablet   Other Relevant Orders   TSH (Completed)     Other   HYPERCHOLESTEROLEMIA    Disc goals for lipids and reasons to control them Rev labs with pt  (last draw)  Lab today  Rev low sat fat diet in detail  Continue crestor       Relevant Medications   labetalol (NORMODYNE) 200 MG tablet   Other Relevant Orders   Lipid panel (Completed)   Hyperglycemia    A1C today  Diet is fair  disc imp of low glycemic diet and wt loss to prevent DM2 Urged to work on wt loss  Relevant Orders   Hemoglobin A1c (Completed)   Morbid obesity (HCC)     S/p bariatric surgery and 100 lb wt loss Discussed how this problem influences overall health and the risks it imposes  Reviewed plan for weight loss with lower calorie diet (via better food choices and also portion control or program like weight watchers) and exercise building up to or more than 30 minutes 5 days per week including some aerobic activity         Systemic lupus erythematosus (HCC)    Continues to see rheumatology- Dr Nickola Major  Doing well  Continues plaquenil  Stable pulmonary/renal status      Vitamin B12 deficiency    Due for B12 inj today (monthly) Helpful clinically       Relevant Medications   cyanocobalamin ((VITAMIN B-12)) injection 1,000 mcg (Completed)      Assessment & Plan:

## 2016-10-03 NOTE — Patient Instructions (Addendum)
Labs today  B12 shot today  Take care of yourself   Eat healthy and stay active    Please ask your gyn practice to send a copy of your mammogram when you get it

## 2016-10-04 NOTE — Assessment & Plan Note (Signed)
Continues to see rheumatology- Dr Nickola MajorHawkes  Doing well  Continues plaquenil  Stable pulmonary/renal status

## 2016-10-04 NOTE — Assessment & Plan Note (Signed)
Due for TSH Some fatigue No other clinical changes

## 2016-10-04 NOTE — Assessment & Plan Note (Signed)
Due for B12 inj today (monthly) Helpful clinically

## 2016-10-04 NOTE — Assessment & Plan Note (Signed)
bp in fair control at this time  BP Readings from Last 1 Encounters:  10/03/16 110/68   No changes needed Disc lifstyle change with low sodium diet and exercise  Labs ordered  Labetalol refilled

## 2016-10-04 NOTE — Assessment & Plan Note (Signed)
A1C today  Diet is fair  disc imp of low glycemic diet and wt loss to prevent DM2 Urged to work on wt loss

## 2016-10-04 NOTE — Assessment & Plan Note (Signed)
Disc goals for lipids and reasons to control them Rev labs with pt  (last draw)  Lab today  Rev low sat fat diet in detail  Continue crestor

## 2016-10-04 NOTE — Assessment & Plan Note (Signed)
S/p bariatric surgery and 100 lb wt loss Discussed how this problem influences overall health and the risks it imposes  Reviewed plan for weight loss with lower calorie diet (via better food choices and also portion control or program like weight watchers) and exercise building up to or more than 30 minutes 5 days per week including some aerobic activity

## 2016-10-17 ENCOUNTER — Ambulatory Visit: Payer: Managed Care, Other (non HMO) | Admitting: General Practice

## 2016-10-17 DIAGNOSIS — D6851 Activated protein C resistance: Secondary | ICD-10-CM

## 2016-10-17 DIAGNOSIS — Z5181 Encounter for therapeutic drug level monitoring: Secondary | ICD-10-CM

## 2016-10-17 LAB — POCT INR: INR: 2.6

## 2016-11-05 ENCOUNTER — Ambulatory Visit: Payer: Managed Care, Other (non HMO)

## 2016-11-13 ENCOUNTER — Ambulatory Visit: Payer: Managed Care, Other (non HMO)

## 2016-11-14 ENCOUNTER — Ambulatory Visit (INDEPENDENT_AMBULATORY_CARE_PROVIDER_SITE_OTHER): Payer: Managed Care, Other (non HMO) | Admitting: General Practice

## 2016-11-14 DIAGNOSIS — E538 Deficiency of other specified B group vitamins: Secondary | ICD-10-CM

## 2016-11-14 DIAGNOSIS — Z5181 Encounter for therapeutic drug level monitoring: Secondary | ICD-10-CM | POA: Diagnosis not present

## 2016-11-14 DIAGNOSIS — Z7901 Long term (current) use of anticoagulants: Secondary | ICD-10-CM

## 2016-11-14 DIAGNOSIS — D6851 Activated protein C resistance: Secondary | ICD-10-CM

## 2016-11-14 LAB — POCT INR: INR: 2.6

## 2016-11-14 MED ORDER — CYANOCOBALAMIN 1000 MCG/ML IJ SOLN
1000.0000 ug | Freq: Once | INTRAMUSCULAR | Status: AC
  Administered 2016-11-14: 1000 ug via INTRAMUSCULAR

## 2016-11-14 NOTE — Patient Instructions (Signed)
Pre visit review using our clinic review tool, if applicable. No additional management support is needed unless otherwise documented below in the visit note. 

## 2016-12-01 ENCOUNTER — Telehealth (HOSPITAL_COMMUNITY): Payer: Self-pay | Admitting: Internal Medicine

## 2016-12-01 NOTE — Telephone Encounter (Signed)
User: Trina AoGriffin, Kalen Neidert A Date/time: 12/01/2016 2:22 PM  Comment: Called pt and lmsg for her to CB to get scheduled for an echo.Edmonia Caprio.RG  Context: [place] Schedule Orders/Appt Requests Outcome: Left Message  Phone number: [phone] Phone Type: Home Phone  Comm. type: Telephone Call type: Outgoing  Contact: Eli HoseParrott, Hennesy A Relation to patient: Self  Letter:      User: Trina AoGriffin, Cailen Mihalik A Date/time: 11/28/2016 11:18 AM  Comment: Called pt and lmsg for her to CB to get scheduled for echo in Dec or Jan..RG  Context: [place] Schedule Orders/Appt Requests Outcome: Left Message  Phone number: [phone] Phone Type: Home Phone  Comm. type: Telephone Call type: Outgoing  Contact: Eli HoseParrott, Leyani A Relation to patient: Self  Letter:      User: Trina AoGriffin, Sabra Sessler A Date/time: 11/20/2016 10:24 AM  Comment: Called pt and lmsg for her to CB to get scheduled for echo.RG  Context: [place] Schedule Orders/Appt Requests Outcome: Left Message  Phone number: [phone] Phone Type: Home Phone  Comm. type: Telephone Call type: Outgoing  Contact: Eli HoseParrott, Matison A Relation to patient: Self  Letter:

## 2016-12-12 ENCOUNTER — Ambulatory Visit (INDEPENDENT_AMBULATORY_CARE_PROVIDER_SITE_OTHER): Payer: Managed Care, Other (non HMO) | Admitting: General Practice

## 2016-12-12 DIAGNOSIS — E538 Deficiency of other specified B group vitamins: Secondary | ICD-10-CM | POA: Diagnosis not present

## 2016-12-12 DIAGNOSIS — Z7901 Long term (current) use of anticoagulants: Secondary | ICD-10-CM

## 2016-12-12 DIAGNOSIS — D6851 Activated protein C resistance: Secondary | ICD-10-CM

## 2016-12-12 LAB — POCT INR: INR: 3

## 2016-12-12 MED ORDER — CYANOCOBALAMIN 1000 MCG/ML IJ SOLN
1000.0000 ug | Freq: Once | INTRAMUSCULAR | Status: AC
  Administered 2017-07-07: 1000 ug via INTRAMUSCULAR

## 2016-12-12 NOTE — Patient Instructions (Addendum)
Pre visit review using our clinic review tool, if applicable. No additional management support is needed unless otherwise documented below in the visit note. 

## 2016-12-22 ENCOUNTER — Ambulatory Visit (HOSPITAL_COMMUNITY): Payer: Managed Care, Other (non HMO) | Attending: Cardiology

## 2016-12-22 ENCOUNTER — Other Ambulatory Visit: Payer: Self-pay

## 2016-12-22 DIAGNOSIS — E7800 Pure hypercholesterolemia, unspecified: Secondary | ICD-10-CM | POA: Diagnosis not present

## 2016-12-22 DIAGNOSIS — I350 Nonrheumatic aortic (valve) stenosis: Secondary | ICD-10-CM | POA: Diagnosis not present

## 2017-01-08 ENCOUNTER — Other Ambulatory Visit: Payer: Self-pay | Admitting: General Practice

## 2017-01-08 ENCOUNTER — Telehealth: Payer: Self-pay | Admitting: General Practice

## 2017-01-08 ENCOUNTER — Telehealth: Payer: Self-pay

## 2017-01-08 NOTE — Telephone Encounter (Signed)
Forwarding this to Bailey Mechindy Boyd, RN Coumadin clinic nurse to review with patient at her appointment tomorrow.

## 2017-01-08 NOTE — Telephone Encounter (Signed)
That sounds fine  thanks

## 2017-01-08 NOTE — Telephone Encounter (Signed)
Dr. Milinda Antisower,  Please review the following proposed Lovenox bridge/hold plan for patient for colonoscopy on 12//21/18: Recommend Lovenox 100mg  SQ bid  Instructions for coumadin and Lovenox pre and post procedure on 12/21.  12/16 - Last dose of coumadin until after procedure 12/17 - Nothing 12/18 - Lovenox in AM and PM (12 hours apart) 12/19 - Lovenox in AM and PM 12/20 - Lovenox in AM only 12/21 - Procedure (no lovenox today) 12/22 - Lovenox X 2 and 2 tablets of coumadin (15 mg) 12/23 - Lovenox X 2 and 2 tablets of coumadin 12/24 - Lovenox X 2 and 2 tablets of coumadin 12/25 - Lovenox X 2 and 2 tablets of coumadin 12/26 - Stop Lovenox and continue to take current dosage of 1 1/2 tablets daily except 2 tablets on Wednesdays.  12[facility]28 [facility]. Milinda Antisower,   Please advise if you approve of above dosing plan.    Thanks

## 2017-01-08 NOTE — Telephone Encounter (Signed)
Opened in error

## 2017-01-08 NOTE — Telephone Encounter (Signed)
Instructions for coumadin and Lovenox pre and post procedure on 12/21.  12/16 - Last dose of coumadin until after procedure 12/17 - Nothing 12/18 - Lovenox in AM and PM (12 hours apart) 12/19 - Lovenox in AM and PM 12/20 - Lovenox in AM only 12/21 - Procedure (no lovenox today) 12/22 - Lovenox X 2 and 2 tablets of coumadin (15 mg) 12/23 - Lovenox X 2 and 2 tablets of coumadin 12/24 - Lovenox X 2 and 2 tablets of coumadin 12/25 - Lovenox X 2 and 2 tablets of coumadin 12/26 - Stop Lovenox and continue to take current dosage of 1 1/2 tablets daily except 2 tablets on Wednesdays.  12[facility]28 - [facility]

## 2017-01-09 ENCOUNTER — Ambulatory Visit (INDEPENDENT_AMBULATORY_CARE_PROVIDER_SITE_OTHER): Payer: Managed Care, Other (non HMO) | Admitting: General Practice

## 2017-01-09 ENCOUNTER — Other Ambulatory Visit: Payer: Self-pay | Admitting: General Practice

## 2017-01-09 ENCOUNTER — Ambulatory Visit: Payer: Managed Care, Other (non HMO)

## 2017-01-09 DIAGNOSIS — D6851 Activated protein C resistance: Secondary | ICD-10-CM

## 2017-01-09 DIAGNOSIS — Z7901 Long term (current) use of anticoagulants: Secondary | ICD-10-CM | POA: Diagnosis not present

## 2017-01-09 DIAGNOSIS — E538 Deficiency of other specified B group vitamins: Secondary | ICD-10-CM | POA: Diagnosis not present

## 2017-01-09 LAB — POCT INR: INR: 3.6

## 2017-01-09 MED ORDER — ENOXAPARIN SODIUM 100 MG/ML ~~LOC~~ SOLN: 100.0000 mg | Freq: Two times a day (BID) | SUBCUTANEOUS | 0 refills | Status: DC

## 2017-01-09 MED ORDER — CYANOCOBALAMIN 1000 MCG/ML IJ SOLN
1000.0000 ug | Freq: Once | INTRAMUSCULAR | Status: AC
  Administered 2017-01-09: 1000 ug via INTRAMUSCULAR

## 2017-01-09 NOTE — Patient Instructions (Addendum)
Pre visit review using our clinic review tool, if applicable. No additional management support is needed unless otherwise documented below in the visit note.  Skip dosage today and then continue to take 1 1/2 tablets all days except 2 tablets on Mondays.  Please follow pt instructions for coumadin and Lovenox.     Instructions for coumadin and Lovenox pre and post procedure on 12/21.  12/16 - Last dose of coumadin until after procedure 12/17 - Nothing 12/18 - Lovenox in AM and PM (12 hours apart) 12/19 - Lovenox in AM and PM 12/20 - Lovenox in AM only 12/21 - Procedure (no lovenox today) 12/22 - Lovenox X 2 and 2 tablets of coumadin (15 mg) 12/23 - Lovenox X 2 and 2 tablets of coumadin 12/24 - Lovenox X 2 and 2 tablets of coumadin 12/25 - Lovenox X 2 and 2 tablets of coumadin 12/26 - Stop Lovenox and continue to take current dosage of 1 1/2 tablets daily except 2 tablets on Wednesdays.  12/28 - [facility]

## 2017-01-09 NOTE — Telephone Encounter (Signed)
Faxed written orders requested by [facility] re: coumadin hold/lovenox bridge for patient's colonoscopy on 01/23/17.  To [facility] [phone].  Confirmation received, order form scanned in to Epic.

## 2017-01-30 ENCOUNTER — Ambulatory Visit: Payer: Managed Care, Other (non HMO)

## 2017-02-08 ENCOUNTER — Other Ambulatory Visit: Payer: Self-pay | Admitting: Internal Medicine

## 2017-02-20 ENCOUNTER — Ambulatory Visit (INDEPENDENT_AMBULATORY_CARE_PROVIDER_SITE_OTHER): Payer: Managed Care, Other (non HMO) | Admitting: General Practice

## 2017-02-20 DIAGNOSIS — Z7901 Long term (current) use of anticoagulants: Secondary | ICD-10-CM | POA: Diagnosis not present

## 2017-02-20 DIAGNOSIS — E538 Deficiency of other specified B group vitamins: Secondary | ICD-10-CM

## 2017-02-20 LAB — POCT INR: INR: 2.3

## 2017-02-20 MED ORDER — CYANOCOBALAMIN 1000 MCG/ML IJ SOLN
1000.0000 ug | Freq: Once | INTRAMUSCULAR | Status: AC
Start: 2017-02-20 — End: 2017-02-20
  Administered 2017-02-20: 1000 ug via INTRAMUSCULAR

## 2017-02-20 NOTE — Patient Instructions (Addendum)
Pre visit review using our clinic review tool, if applicable. No additional management support is needed unless otherwise documented below in the visit note.  Take 2 tablets today and then continue to take 1 1/2 tablets all days except 2 tablets on Mondays.  Re-check in 4 weeks.

## 2017-03-01 ENCOUNTER — Other Ambulatory Visit: Payer: Self-pay | Admitting: Internal Medicine

## 2017-03-09 ENCOUNTER — Other Ambulatory Visit: Payer: Self-pay | Admitting: General Practice

## 2017-03-09 ENCOUNTER — Other Ambulatory Visit: Payer: Self-pay | Admitting: Family Medicine

## 2017-03-09 MED ORDER — WARFARIN SODIUM 7.5 MG PO TABS: ORAL_TABLET | ORAL | 1 refills | Status: DC

## 2017-03-09 NOTE — Telephone Encounter (Signed)
Routing to her coumadin nurse

## 2017-03-20 ENCOUNTER — Ambulatory Visit (INDEPENDENT_AMBULATORY_CARE_PROVIDER_SITE_OTHER): Payer: Managed Care, Other (non HMO) | Admitting: General Practice

## 2017-03-20 DIAGNOSIS — E538 Deficiency of other specified B group vitamins: Secondary | ICD-10-CM | POA: Diagnosis not present

## 2017-03-20 DIAGNOSIS — Z7901 Long term (current) use of anticoagulants: Secondary | ICD-10-CM | POA: Diagnosis not present

## 2017-03-20 DIAGNOSIS — D6851 Activated protein C resistance: Secondary | ICD-10-CM

## 2017-03-20 LAB — POCT INR: INR: 2.5

## 2017-03-20 MED ORDER — CYANOCOBALAMIN 1000 MCG/ML IJ SOLN
1000.0000 ug | Freq: Once | INTRAMUSCULAR | Status: AC
  Administered 2017-03-20: 1000 ug via INTRAMUSCULAR

## 2017-03-20 NOTE — Patient Instructions (Addendum)
Pre visit review using our clinic review tool, if applicable. No additional management support is needed unless otherwise documented below in the visit note.  Continue to take 1 1/2 tablets all days except 2 tablets on Mondays.  Re-check in 4 weeks.   

## 2017-03-25 ENCOUNTER — Other Ambulatory Visit (HOSPITAL_COMMUNITY): Payer: Self-pay | Admitting: *Deleted

## 2017-03-26 ENCOUNTER — Ambulatory Visit (HOSPITAL_COMMUNITY): Admission: RE | Admit: 2017-03-26 | Payer: Managed Care, Other (non HMO) | Source: Ambulatory Visit

## 2017-04-02 ENCOUNTER — Encounter (HOSPITAL_COMMUNITY): Payer: Managed Care, Other (non HMO)

## 2017-04-20 ENCOUNTER — Ambulatory Visit (HOSPITAL_COMMUNITY)
Admission: RE | Admit: 2017-04-20 | Discharge: 2017-04-20 | Disposition: A | Discharge status: Home / Self Care | Payer: Managed Care, Other (non HMO) | Source: Ambulatory Visit | Attending: Nephrology | Admitting: Nephrology

## 2017-04-20 DIAGNOSIS — E611 Iron deficiency: Secondary | ICD-10-CM | POA: Insufficient documentation

## 2017-04-20 MED ORDER — SODIUM CHLORIDE 0.9 % IV SOLN
510.0000 mg | INTRAVENOUS | Status: DC
  Administered 2017-04-20: 510 mg via INTRAVENOUS
  Filled 2017-04-20: qty 17

## 2017-04-24 ENCOUNTER — Ambulatory Visit (INDEPENDENT_AMBULATORY_CARE_PROVIDER_SITE_OTHER): Payer: Managed Care, Other (non HMO) | Admitting: General Practice

## 2017-04-24 DIAGNOSIS — E538 Deficiency of other specified B group vitamins: Secondary | ICD-10-CM | POA: Diagnosis not present

## 2017-04-24 DIAGNOSIS — Z7901 Long term (current) use of anticoagulants: Secondary | ICD-10-CM

## 2017-04-24 DIAGNOSIS — D6851 Activated protein C resistance: Secondary | ICD-10-CM

## 2017-04-24 LAB — POCT INR: INR: 2.9

## 2017-04-24 MED ORDER — CYANOCOBALAMIN 1000 MCG/ML IJ SOLN
1000.0000 ug | Freq: Once | INTRAMUSCULAR | Status: AC
  Administered 2017-04-24: 1000 ug via INTRAMUSCULAR

## 2017-04-24 NOTE — Patient Instructions (Addendum)
Pre visit review using our clinic review tool, if applicable. No additional management support is needed unless otherwise documented below in the visit note.  Continue to take 1 1/2 tablets all days except 2 tablets on Mondays.  Re-check in 5 weeks.

## 2017-04-27 ENCOUNTER — Ambulatory Visit (HOSPITAL_COMMUNITY)
Admission: RE | Admit: 2017-04-27 | Discharge: 2017-04-27 | Disposition: A | Discharge status: Home / Self Care | Payer: Managed Care, Other (non HMO) | Source: Ambulatory Visit | Attending: Nephrology | Admitting: Nephrology

## 2017-04-27 DIAGNOSIS — E611 Iron deficiency: Secondary | ICD-10-CM | POA: Insufficient documentation

## 2017-04-27 MED ORDER — SODIUM CHLORIDE 0.9 % IV SOLN
510.0000 mg | INTRAVENOUS | Status: AC
  Administered 2017-04-27: 510 mg via INTRAVENOUS
  Filled 2017-04-27: qty 17

## 2017-05-08 ENCOUNTER — Ambulatory Visit: Payer: Managed Care, Other (non HMO)

## 2017-05-29 ENCOUNTER — Ambulatory Visit: Payer: Managed Care, Other (non HMO)

## 2017-05-29 ENCOUNTER — Ambulatory Visit (INDEPENDENT_AMBULATORY_CARE_PROVIDER_SITE_OTHER): Payer: Managed Care, Other (non HMO) | Admitting: General Practice

## 2017-05-29 DIAGNOSIS — E538 Deficiency of other specified B group vitamins: Secondary | ICD-10-CM

## 2017-05-29 DIAGNOSIS — Z7901 Long term (current) use of anticoagulants: Secondary | ICD-10-CM | POA: Diagnosis not present

## 2017-05-29 DIAGNOSIS — D6851 Activated protein C resistance: Secondary | ICD-10-CM

## 2017-05-29 LAB — POCT INR: INR: 2

## 2017-05-29 MED ORDER — CYANOCOBALAMIN 1000 MCG/ML IJ SOLN
1000.0000 ug | Freq: Once | INTRAMUSCULAR | Status: AC
  Administered 2017-05-29: 1000 ug via INTRAMUSCULAR

## 2017-05-29 NOTE — Patient Instructions (Addendum)
Pre visit review using our clinic review tool, if applicable. No additional management support is needed unless otherwise documented below in the visit note.  Take 2 tablets today and tomorrow and then continue to take 1 1/2 tablets all days except 2 tablets on Mondays.  Re-check in 4 weeks.

## 2017-07-03 ENCOUNTER — Ambulatory Visit: Payer: Managed Care, Other (non HMO)

## 2017-07-06 ENCOUNTER — Telehealth: Payer: Self-pay | Admitting: Internal Medicine

## 2017-07-06 ENCOUNTER — Other Ambulatory Visit: Payer: Self-pay | Admitting: *Deleted

## 2017-07-06 MED ORDER — ROSUVASTATIN CALCIUM 20 MG PO TABS: 20.0000 mg | ORAL_TABLET | Freq: Every day | ORAL | 0 refills | Status: DC

## 2017-07-06 NOTE — Telephone Encounter (Signed)
New Message:        *STAT* If patient is at the pharmacy, call can be transferred to refill team.   1. Which medications need to be refilled? (please list name of each medication and dose if known) rosuvastatin (CRESTOR) 20 MG tablet  2. Which pharmacy/location (including street and city if local pharmacy) is medication to be sent to?[id number] - [place], [place] - [address]  3. Do they need a 30 day or 90 day supply? 30

## 2017-07-07 ENCOUNTER — Ambulatory Visit (INDEPENDENT_AMBULATORY_CARE_PROVIDER_SITE_OTHER): Payer: Managed Care, Other (non HMO) | Admitting: General Practice

## 2017-07-07 DIAGNOSIS — Z7901 Long term (current) use of anticoagulants: Secondary | ICD-10-CM | POA: Diagnosis not present

## 2017-07-07 DIAGNOSIS — D6851 Activated protein C resistance: Secondary | ICD-10-CM

## 2017-07-07 DIAGNOSIS — E538 Deficiency of other specified B group vitamins: Secondary | ICD-10-CM | POA: Diagnosis not present

## 2017-07-07 LAB — POCT INR: INR: 2.4 (ref 2.0–3.0)

## 2017-07-07 MED ORDER — CYANOCOBALAMIN 1000 MCG/ML IJ SOLN: 1000.0000 ug | Freq: Once | INTRAMUSCULAR | Status: DC

## 2017-07-07 NOTE — Patient Instructions (Addendum)
Pre visit review using our clinic review tool, if applicable. No additional management support is needed unless otherwise documented below in the visit note.  Take 2 tablets today and then continue to take 1 1/2 tablets all days except 2 tablets on Mondays.  Re-check in 4 weeks.

## 2017-07-29 ENCOUNTER — Other Ambulatory Visit: Payer: Self-pay | Admitting: Internal Medicine

## 2017-08-03 ENCOUNTER — Ambulatory Visit (INDEPENDENT_AMBULATORY_CARE_PROVIDER_SITE_OTHER): Payer: Managed Care, Other (non HMO) | Admitting: Physician Assistant

## 2017-08-03 ENCOUNTER — Encounter: Payer: Self-pay | Admitting: Physician Assistant

## 2017-08-03 VITALS — BP 122/88 | HR 70 | Ht 65.0 in | Wt 306.0 lb

## 2017-08-03 DIAGNOSIS — E7800 Pure hypercholesterolemia, unspecified: Secondary | ICD-10-CM | POA: Diagnosis not present

## 2017-08-03 DIAGNOSIS — I10 Essential (primary) hypertension: Secondary | ICD-10-CM

## 2017-08-03 DIAGNOSIS — I350 Nonrheumatic aortic (valve) stenosis: Secondary | ICD-10-CM | POA: Diagnosis not present

## 2017-08-03 NOTE — Patient Instructions (Addendum)
Medication Instructions:  Your physician recommends that you continue on your current medications as directed. Please refer to the Current Medication list given to you today.   Labwork: None ordered  Testing/Procedures: None ordered  Follow-Up: Your physician wants you to follow-up in: 6 MONTHS WITH DR. ROSS  You will receive a reminder letter in the mail two months in advance. If you don't receive a letter, please call our office to schedule the follow-up appointment.   Any Other Special Instructions Will Be Listed Below (If Applicable).     If you need a refill on your cardiac medications before your next appointment, please call your pharmacy.   

## 2017-08-03 NOTE — Progress Notes (Signed)
Cardiology Office Note    Date:  08/03/2017   ID:  [name], DOB Apr 19, 1965, MRN [id number]  PCP:  Judy Pimple, MD  Cardiologist:  Dr. Tenny Craw  Chief Complaint: Follow-up and medication refill  History of Present Illness:   [name] is a 52 y.o. female with history of factor V leiden,  PE , DVT,  intracerebral hemorrhage, lupus nephritis (follows by Dr. Abel Presto) HLD, HLD and moderate AS presents for yearly follow up.    Last echo 12/2016 showed LVEF of 55-60%, grade 1 DD, moderate AS ( increased from prior with Mean gradient (S): 25 mm Hg. Peak gradient (S): 43 mm Hg. Valve area (Vmax): 1.34 cm^2.), moderately dilated RV, moderate dilated RA and mild increase in pulmonary pressure.   He was doing well on cardiac stand point when last seen by Dr. Tenny Craw 02/2016.  Here today for follow up.  Intermittent lower extremity edema, she takes Lasix for this.  Usually requires once every few months.  She denies shortness of breath, dizziness, palpitation, chest pain, orthopnea, PND or syncope.  No regular exercise however recently enrolled in [employer].   Past Medical History:  Diagnosis Date  . Anxiety state, unspecified    panic attacks  . Arthritis    Lupus - hands/knees  . DVT (deep venous thrombosis) (HCC)   . Edema   . 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 GERD (gastroesophageal reflux disease)    on prilosec r/t gastric sleeve surgery  . HLD (hyperlipidemia)   . Iron deficiency anemia    IV dextran [place]  . Nephritis and nephropathy, not specified as acute or chronic, with unspecified pathological lesion in kidney   . Nonspecific abnormal results of liver function study   . Obesity, unspecified   . Other pulmonary embolism and infarction   . Other specified acquired hypothyroidism   . Panic disorder without agoraphobia   . Personal history of venous  thrombosis and embolism 2002   during pregnancy; ?factor 5 leiden (sees heme)  . Pleural effusion 2005   c/w lupus initial w/u; recurrent Right as pred tapered off July 2011  . Pure hypercholesterolemia   . Sleep apnea   . 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 Unspecified essential hypertension   . Unspecified pleural effusion     Past Surgical History:  Procedure Laterality Date  . gastric sleeve  8/12   bariatric surgery Dr Adolphus Birchwood   . LAPAROSCOPIC TUBAL LIGATION  12/09/2010   Procedure: LAPAROSCOPIC TUBAL LIGATION;  Surgeon: Juluis Mire;  Location: [facility];  Service: Gynecology;  Laterality: Bilateral;  Attempted see Nursing note  . pleurocentesis  10/2004  . Pleuryx catheter placement  9/11    VanTright----removed 9/11  . RENAL BIOPSY  09/24/2012  . svd     x 3  . THORACENTESIS  2010   with penumonia  . WISDOM TOOTH EXTRACTION      Current Medications: Prior to Admission medications   Medication Sig Start Date End Date Taking? Authorizing Provider  BIOTIN PO Take 1 capsule by mouth daily.    [provider]  Cyanocobalamin (VITAMIN B-12 PO) Take 1 capsule by mouth daily.    [provider]  cyclobenzaprine (FLEXERIL) 5 MG tablet Take 0.5-1 tablets (2.5-5 mg total) by mouth 3 (  three) times daily as needed for muscle spasms (headache). Caution of sedation 10/15/15   Tower, Audrie Gallus, MD  enoxaparin (LOVENOX) 100 MG/ML injection Inject 1 mL (100 mg total) into the skin every 12 (twelve) hours. 01/09/17   Tower, Audrie Gallus, MD  hydroxychloroquine (PLAQUENIL) 200 MG tablet Take 200 mg by mouth 2 (two) times daily.    [provider]  IRON PO Take 1 tablet by mouth daily.    [provider]  labetalol (NORMODYNE) 200 MG tablet Take 1 and 1/2 tablets  by mouth twice a day 10/03/16   Tower, Audrie Gallus, MD  levothyroxine (SYNTHROID, LEVOTHROID) 175 MCG tablet Take 1 tablet (175 mcg total) by mouth daily. 10/03/16    Tower, Audrie Gallus, MD  Multiple Vitamin (MULTIVITAMIN) tablet Take 1 tablet by mouth daily.    [provider]  omeprazole (PRILOSEC) 20 MG capsule Take 1 capsule (20 mg total) by mouth daily. 10/03/16   Tower, Audrie Gallus, MD  PARoxetine (PAXIL) 40 MG tablet Take 1 tablet (40 mg total) by mouth every morning. 10/03/16   Tower, Audrie Gallus, MD  rosuvastatin (CRESTOR) 20 MG tablet Take 1 tablet (20 mg total) by mouth daily. Please keep upcoming appt for future refills thank you. 07/29/17   Pricilla Riffle, MD  warfarin (COUMADIN) 7.5 MG tablet TAKE AS DIRECTED BY ANTICOAGULATION CLINIC 03/09/17   Tower, Audrie Gallus, MD  warfarin (COUMADIN) 7.5 MG tablet Take 1 1/2 tablets daily except 2 tablets on Wednesday or AS DIRECTED BY ANTICOAGULATION CLINIC. 03/09/17   Tower, Audrie Gallus, MD    Allergies:   Lisinopril; Lovenox [enoxaparin sodium]; Moxifloxacin; and Amoxicillin   Social History   Socioeconomic History  . Marital status: Divorced    Spouse name: Not on file  . Number of children: 3  . Years of education: Not on file  . Highest education level: Not on file  Occupational History  . Occupation: Psychologist, educational at [employer]  . Financial resource strain: Not on file  . Food insecurity:    Worry: Not on file    Inability: Not on file  . Transportation needs:    Medical: Not on file    Non-medical: Not on file  Tobacco Use  . Smoking status: Former Smoker    Packs/day: 0.50    Years: 10.00    Pack years: 5.00    Types: Cigarettes    Last attempt to quit: 05/04/2009    Years since quitting: 8.2  . Smokeless tobacco: Never Used  . Tobacco comment: Socially x10 years (1 pack/month)  Substance and Sexual Activity  . Alcohol use: Yes    Alcohol/week: 0.0 oz    Comment: rare  . Drug use: No  . Sexual activity: Not on file  Lifestyle  . Physical activity:    Days per week: Not on file    Minutes per session: Not on file  . Stress: Not on file  Relationships  . Social connections:     Talks on phone: Not on file    Gets together: Not on file    Attends religious service: Not on file    Active member of club or organization: Not on file    Attends meetings of clubs or organizations: Not on file    Relationship status: Not on file  Other Topics Concern  . Not on file  Social History Narrative  . Not on file     Family History:  [name] family history  includes Diabetes in her unknown relative; Hyperlipidemia in her father; Hypertension in her father; Leukemia in her sister; Lupus in her sister.   ROS:   Please see the history of present illness.    ROS All other systems reviewed and are negative.   PHYSICAL EXAM:   VS:  BP 122/88   Pulse 70   Ht 5\' 5"  (1.651 m)   Wt (!) 306 lb (138.8 kg)   BMI 50.92 kg/m    GEN: Obese female in no acute distress  HEENT: normal  Neck: no JVD, carotid bruits, or masses Cardiac: RRR; no murmurs, rubs, or gallops, trace edema with venous stasis   Respiratory:  clear to auscultation bilaterally, normal work of breathing GI: soft, nontender, nondistended, + BS MS: no deformity or atrophy  Skin: warm and dry, no rash Neuro:  Alert and Oriented x 3, Strength and sensation are intact Psych: euthymic mood, full affect  Wt Readings from Last 3 Encounters:  08/03/17 (!) 306 lb (138.8 kg)  04/27/17 292 lb (132.5 kg)  04/20/17 292 lb (132.5 kg)    Studies/Labs Reviewed:   EKG:  EKG is ordered today.  The ekg ordered today demonstrates NSR at rate of 70 bpm   Recent Labs: 10/03/2016: ALT 18; BUN 11; Creatinine, Ser 1.01; Hemoglobin 11.6; Platelets 184.0; Potassium 4.2; Sodium 138; TSH 0.46   Lipid Panel    Component Value Date/Time   CHOL 185 10/03/2016 1246   TRIG 106.0 10/03/2016 1246   HDL 48.00 10/03/2016 1246   CHOLHDL 4 10/03/2016 1246   VLDL 21.2 10/03/2016 1246   LDLCALC 116 (H) 10/03/2016 1246   LDLDIRECT 203.7 06/03/2011 1433    Additional studies/ records that were reviewed today include:    Echocardiogram: 12/2016 Study Conclusions  - Left ventricle: The cavity size was normal. Wall thickness was   normal. Systolic function was normal. The estimated ejection   fraction was in the range of 55% to 60%. Wall motion was normal;   there were no regional wall motion abnormalities. Doppler   parameters are consistent with abnormal left ventricular   relaxation (grade 1 diastolic dysfunction). - Aortic valve: Trileaflet; moderately thickened, moderately   calcified leaflets. Valve mobility was restricted. There was   moderate stenosis. Peak velocity (S): 329 cm/s. Mean gradient   (S): 25 mm Hg. Peak gradient (S): 43 mm Hg. Valve area (Vmax):   1.34 cm^2. - Mitral valve: Calcified annulus. - Left atrium: The atrium was mildly dilated. Volume/bsa, ES   (1-plane Simpson&'s, A4C): 33.7 ml/m^2. - Right ventricle: The cavity size was moderately dilated. Wall   thickness was normal. - Right atrium: The atrium was moderately dilated. - Pulmonary arteries: Systolic pressure was mildly increased.  Impressions:  - When compared to prior, aortic stenosis has increased.    ASSESSMENT & PLAN:    1. Moderate aortic stenosis  -last echocardiogram November 2018 as above.  Asymptomatic.  Repeat echocardiogram during follow-up.  2.  Mild lower extremity edema bilaterally -Likely due to obesity and chronic venous stasis.  Continue to take as needed Lasix..  3.  Hyperlipidemia -Followed by PCP.  Continue statin.  4. History of factor V leiden,  PE , DVT - Coumadin per pharmacy   Medication Adjustments/Labs and Tests Ordered: Current medicines are reviewed at length with the patient today.  Concerns regarding medicines are outlined above.  Medication changes, Labs and Tests ordered today are listed in the Patient Instructions below. Patient Instructions  Medication Instructions:  Your physician  recommends that you continue on your current medications as directed. Please refer to  the Current Medication list given to you today.   Labwork: None ordered  Testing/Procedures: None ordered  Follow-Up: Your physician wants you to follow-up in: 6 MONTHS WITH DR. ROSS  You will receive a reminder letter in the mail two months in advance. If you don't receive a letter, please call our office to schedule the follow-up appointment.    Any Other Special Instructions Will Be Listed Below (If Applicable).     If you need a refill on your cardiac medications before your next appointment, please call your pharmacy.      Lorelei PontSigned, Lasheika Ortloff, [place]  08/03/2017 10:34 AM    [facility] [address], [place], [place]  [address] Phone: [phone]; Fax: [phone]

## 2017-08-11 ENCOUNTER — Ambulatory Visit (INDEPENDENT_AMBULATORY_CARE_PROVIDER_SITE_OTHER): Payer: Managed Care, Other (non HMO) | Admitting: General Practice

## 2017-08-11 DIAGNOSIS — D6851 Activated protein C resistance: Secondary | ICD-10-CM

## 2017-08-11 DIAGNOSIS — Z7901 Long term (current) use of anticoagulants: Secondary | ICD-10-CM | POA: Diagnosis not present

## 2017-08-11 DIAGNOSIS — E538 Deficiency of other specified B group vitamins: Secondary | ICD-10-CM | POA: Diagnosis not present

## 2017-08-11 LAB — POCT INR: INR: 2.9 (ref 2.0–3.0)

## 2017-08-11 MED ORDER — CYANOCOBALAMIN 1000 MCG/ML IJ SOLN
1000.0000 ug | Freq: Once | INTRAMUSCULAR | Status: AC
  Administered 2017-08-11: 1000 ug via INTRAMUSCULAR

## 2017-08-11 NOTE — Patient Instructions (Signed)
Pre visit review using our clinic review tool, if applicable. No additional management support is needed unless otherwise documented below in the visit note.  Continue to take 1 1/2 tablets all days except 2 tablets on Mondays.  Re-check in 4 weeks.   

## 2017-08-14 ENCOUNTER — Encounter: Payer: Self-pay | Admitting: Family Medicine

## 2017-08-14 ENCOUNTER — Ambulatory Visit (INDEPENDENT_AMBULATORY_CARE_PROVIDER_SITE_OTHER): Payer: Managed Care, Other (non HMO) | Admitting: Family Medicine

## 2017-08-14 VITALS — BP 124/80 | HR 77 | Temp 98.9°F | Ht 65.0 in | Wt 306.5 lb

## 2017-08-14 DIAGNOSIS — M3213 Lung involvement in systemic lupus erythematosus: Secondary | ICD-10-CM

## 2017-08-14 DIAGNOSIS — R51 Headache: Secondary | ICD-10-CM | POA: Diagnosis not present

## 2017-08-14 DIAGNOSIS — Z86711 Personal history of pulmonary embolism: Secondary | ICD-10-CM

## 2017-08-14 DIAGNOSIS — D6851 Activated protein C resistance: Secondary | ICD-10-CM | POA: Diagnosis not present

## 2017-08-14 DIAGNOSIS — I350 Nonrheumatic aortic (valve) stenosis: Secondary | ICD-10-CM | POA: Diagnosis not present

## 2017-08-14 DIAGNOSIS — Z8679 Personal history of other diseases of the circulatory system: Secondary | ICD-10-CM | POA: Diagnosis not present

## 2017-08-14 DIAGNOSIS — Z7901 Long term (current) use of anticoagulants: Secondary | ICD-10-CM | POA: Diagnosis not present

## 2017-08-14 DIAGNOSIS — R519 Headache, unspecified: Secondary | ICD-10-CM

## 2017-08-14 MED ORDER — ROSUVASTATIN CALCIUM 20 MG PO TABS: 20.0000 mg | ORAL_TABLET | Freq: Every day | ORAL | 0 refills | Status: DC

## 2017-08-14 MED ORDER — CYCLOBENZAPRINE HCL 5 MG PO TABS: 2.5000 mg | ORAL_TABLET | Freq: Three times a day (TID) | ORAL | 0 refills | Status: DC | PRN

## 2017-08-14 NOTE — Patient Instructions (Signed)
?  headache coming from neck arthritis.  Do gentle stretching for the neck  May try heating pad to neck Continue flexeril muscle relaxant as needed. Normal exam today.  Let us know if persistent symptoms despite above for further imaging.

## 2017-08-14 NOTE — Progress Notes (Signed)
BP 124/80 (BP Location: Left Arm, Patient Position: Sitting, Cuff Size: Large)   Pulse 77   Temp 98.9 F (37.2 C) (Oral)   Ht 5\' 5"  (1.651 m)   Wt (!) 306 lb 8 oz (139 kg)   SpO2 96%   BMI 51.00 kg/m    CC: headaches Subjective:    Patient ID: [name], female    DOB: 11/10/1965, 52 y.o.   MRN: [id number]  HPI: [name] is a 52 y.o. female presenting on 08/14/2017 for Headache (C/o constant HA for more than 1 wk. Concerned due to past brain bleed. Denies N/V but had some diarrhea yesterday. Tried Tylenol. )   1+ wk h/o persistent headache. Denies inciting trigger. She was on vacation before symptoms started. Denies inticing trauma or injury. Recently saw eye doctor with good report. Sleeps well. Doesn't wake up with headache. Improves in severity but never fully resolves.  Pain can start in neck and travel up "like fingers". No aura or vision changes.  At times bitemporal pressure.  No nausea/vomiting, no significant photo/phonophobia. Denies paresthesias, numbness, weakness. No new rash. No recent tick bite. No fevers/chills. No abdominal pain. No vision changes.  Episode of diarrhea yesterday.   Taking flexeril every night she comes home then goes to bed.  Has also gone through a bottle of tylenol.  H/o optical migraines, no h/o typical migraines.   Known SLE on plaquenil.  H/o bleed in brain - attributed to coumadin/lupus.  She takes coumadin for Factor V Leiden and h/o recurrent DVTs and PEs.   Requests flexeril and crestor refilled.   Relevant past medical, surgical, family and social history reviewed and updated as indicated. Interim medical history since our last visit reviewed. Allergies and medications reviewed and updated. Outpatient Medications Prior to Visit  Medication Sig Dispense Refill  . BIOTIN PO Take 1 capsule by mouth daily.    . Cyanocobalamin (VITAMIN B-12 PO) Take 1 capsule by mouth daily.    . hydroxychloroquine (PLAQUENIL) 200 MG tablet  Take 200 mg by mouth 2 (two) times daily.    . IRON PO Take 1 tablet by mouth daily.    Marland Kitchen. labetalol (NORMODYNE) 200 MG tablet Take 1 and 1/2 tablets  by mouth twice a day 270 tablet 3  . levothyroxine (SYNTHROID, LEVOTHROID) 175 MCG tablet Take 1 tablet (175 mcg total) by mouth daily. 90 tablet 3  . Multiple Vitamin (MULTIVITAMIN) tablet Take 1 tablet by mouth daily.    Marland Kitchen. omeprazole (PRILOSEC) 20 MG capsule Take 1 capsule (20 mg total) by mouth daily. 90 capsule 3  . PARoxetine (PAXIL) 40 MG tablet Take 1 tablet (40 mg total) by mouth every morning. 90 tablet 3  . warfarin (COUMADIN) 7.5 MG tablet TAKE AS DIRECTED BY ANTICOAGULATION CLINIC 90 tablet 2  . warfarin (COUMADIN) 7.5 MG tablet Take 1 1/2 tablets daily except 2 tablets on Wednesday or AS DIRECTED BY ANTICOAGULATION CLINIC. 180 tablet 1  . cyclobenzaprine (FLEXERIL) 5 MG tablet Take 0.5-1 tablets (2.5-5 mg total) by mouth 3 (three) times daily as needed for muscle spasms (headache). Caution of sedation 90 tablet 1  . rosuvastatin (CRESTOR) 20 MG tablet Take 1 tablet (20 mg total) by mouth daily. Please keep upcoming appt for future refills thank you. 30 tablet 0   Facility-Administered Medications Prior to Visit  Medication Dose Route Frequency Provider Last Rate Last Dose  . cyanocobalamin ((VITAMIN B-12)) injection 1,000 mcg  1,000 mcg Intramuscular Q30 days Tower, [place]  A, MD   1,000 mcg at 12/12/16 0925  . cyanocobalamin ((VITAMIN B-12)) injection 1,000 mcg  1,000 mcg Intramuscular Once Corwin Levins, MD         Per HPI unless specifically indicated in ROS section below Review of Systems     Objective:    BP 124/80 (BP Location: Left Arm, Patient Position: Sitting, Cuff Size: Large)   Pulse 77   Temp 98.9 F (37.2 C) (Oral)   Ht 5\' 5"  (1.651 m)   Wt (!) 306 lb 8 oz (139 kg)   SpO2 96%   BMI 51.00 kg/m   Wt Readings from Last 3 Encounters:  08/14/17 (!) 306 lb 8 oz (139 kg)  08/03/17 (!) 306 lb (138.8 kg)  04/27/17 292  lb (132.5 kg)    Physical Exam  Constitutional: She appears well-developed and well-nourished. No distress.  HENT:  Head: Normocephalic and atraumatic.  Mouth/Throat: Oropharynx is clear and moist.  No significant midline cervical pain or cervical paraspinous mm or at occipital skull No temporal discomfort to palpation.   Eyes: Pupils are equal, round, and reactive to light. EOM are normal.  Neck: Normal range of motion.  FROM at cervical neck  Cardiovascular: Normal rate and regular rhythm.  Murmur (3/6 systolic at USB) heard. Pulmonary/Chest: Effort normal and breath sounds normal. No respiratory distress. She has no wheezes. She has no rales.  Neurological: She is alert. She has normal strength. No cranial nerve deficit or sensory deficit. She displays a negative Romberg sign. Coordination and gait normal.  CN 2-12 intact FTN intact EOMI  No pronator drift Station and gait intact  Nursing note and vitals reviewed.  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       History of cerebral hemorrhage    H/o this 2014 presented with persistent HA with nausea.      Headache - Primary    Describes 1 wk h/o persistent headache that worsens as day progresses. Does not have typical features of migraine. Will treat as possible cervicogenic and TTH (refill flexeril, trial heating pad to neck, gentle stretching) and if not improving, I did ask patient to contact me to consider non-contrasted head CT. In h/o cerebral hemorrhage, low threshold to re-image. No nausea at this time.       Relevant Medications   cyclobenzaprine (FLEXERIL) 5 MG tablet   Factor V Leiden (HCC)    Chronic anticoagulation   Aortic stenosis   Relevant Medications   rosuvastatin (CRESTOR) 20 MG tablet       Meds ordered this encounter  Medications  . rosuvastatin (CRESTOR) 20 MG tablet    Sig: Take 1 tablet (20 mg total) by mouth daily.    Dispense:  90 tablet    Refill:  0  . cyclobenzaprine (FLEXERIL) 5 MG tablet    Sig: Take 0.5-1 tablets (2.5-5 mg total) by mouth 3 (three) times daily as needed for muscle spasms (headache). Caution of sedation    Dispense:  90 tablet    Refill:  0   No orders of the defined types were placed in this  encounter.   Follow up plan: No follow-ups on file.  Ria Bush, MD

## 2017-08-15 DIAGNOSIS — I350 Nonrheumatic aortic (valve) stenosis: Secondary | ICD-10-CM | POA: Insufficient documentation

## 2017-08-15 NOTE — Assessment & Plan Note (Addendum)
H/o this 2014 presented with persistent HA with nausea.

## 2017-08-15 NOTE — Assessment & Plan Note (Addendum)
Describes 1 wk h/o persistent headache that worsens as day progresses. Does not have typical features of migraine. Will treat as possible cervicogenic and TTH (refill flexeril, trial heating pad to neck, gentle stretching) and if not improving, I did ask patient to contact me to consider non-contrasted head CT. In h/o cerebral hemorrhage, low threshold to re-image. No nausea at this time.

## 2017-08-15 NOTE — Assessment & Plan Note (Signed)
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

## 2017-09-04 ENCOUNTER — Encounter: Payer: Self-pay | Admitting: Family Medicine

## 2017-09-04 ENCOUNTER — Ambulatory Visit (INDEPENDENT_AMBULATORY_CARE_PROVIDER_SITE_OTHER): Payer: Managed Care, Other (non HMO) | Admitting: Family Medicine

## 2017-09-04 VITALS — BP 128/78 | HR 67 | Temp 98.1°F | Ht 65.0 in | Wt 311.0 lb

## 2017-09-04 DIAGNOSIS — G8929 Other chronic pain: Secondary | ICD-10-CM

## 2017-09-04 DIAGNOSIS — R51 Headache: Secondary | ICD-10-CM | POA: Diagnosis not present

## 2017-09-04 DIAGNOSIS — R519 Headache, unspecified: Secondary | ICD-10-CM

## 2017-09-04 MED ORDER — METHOCARBAMOL 500 MG PO TABS: 500.0000 mg | ORAL_TABLET | Freq: Three times a day (TID) | ORAL | 1 refills | Status: DC | PRN

## 2017-09-04 NOTE — Progress Notes (Signed)
Subjective:    Patient ID: [name], female    DOB: 02/03/1965, 52 y.o.   MRN: [id number]  HPI Here for c/o headache  Wt Readings from Last 3 Encounters:  09/04/17 (!) 311 lb (141.1 kg)  08/14/17 (!) 306 lb 8 oz (139 kg)  08/03/17 (!) 306 lb (138.8 kg)   51.75 kg/m    Past h/o ICH She has had migraine type and cervicogenic headaches in the past   Has been having headaches again  She took flexeril prn -helpful  Makes her sleepy when she takes it during the day / better to take it at night  Also tired and stressed and appetite is up  Going on since the week of July 4th  Saw Dr Reece AgarG  Thought it was from neck/tension   They persist  Does not feel like the headache she had with ICH - less severe and w/o assoc symptoms   Headaches are daily - come and go  General pattern- headache comes on when she gets ready for work in the am - comes in waves through the day  If she strains it gets worse/ or if she bends over  Not worse with exertion in general  Can be either throbbing or constant   Bilateral - across forehead and temples and occ in back of head  Neck is not hurting Feels tight at times /stress related  No nausea / no sens to light or sound   Does have occ opth migraine - gets shimmering visual aura  Can last 30 min to 2 hours - getting those daily for the week before her headaches started (she was on vacation)- not followed by headaches   She is pre menopausal  No recent hot flashes or night sweats (had some in the winter)   occ takes tylenol - sometimes helps   Caffeine - 2 cups of coffee in am and 2 more during the day That has not increased  Water intake - trying to drink more (sips constantly)   Has eaten more junk foods lately- constant want to snacks Uses sweet n low in coffee   Stress is high - new system at work Sleep- thinks ok - goes to bed 11 and gets up at 6  Is sleepy in the am - but Lupus has always made it that way  Did have sleep apnea -  had cpap in the past    Patient Active Problem List   Diagnosis Date Noted  . Aortic stenosis 08/15/2017  . Vitamin B12 deficiency 11/15/2015  . Muscle cramps 11/14/2015  . Allergic rhinitis 07/01/2015  . History of cerebral hemorrhage 07/01/2015  . Headache 06/29/2015  . Screening for osteoporosis 11/04/2013  . Pain, eye, right 10/25/2013  . Encounter for therapeutic drug monitoring 03/25/2013  . CKD (chronic kidney disease) stage 2, GFR 60-89 ml/min 12/20/2012  . 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 Routine general medical examination at a health care facility 12/07/2012  . Steroid long-term use 12/07/2012  . History of HPV infection 12/07/2012  . Long term (current) use of anticoagulants 10/06/2012  . Chronic anticoagulation 04/22/2012  . S/P bariatric surgery 04/22/2012  . Factor V Leiden (HCC) 04/22/2012  . Urine abnormality 04/16/2012  . Neck pain 02/23/2012  . OSA on CPAP 11/20/2011  . Hyperglycemia 09/16/2010  . EDEMA 01/08/2010  . PLEURAL EFFUSION 08/27/2009  . ENLARGEMENT OF LYMPH NODES 08/13/2009  . History of pulmonary embolism 05/24/2009  . Morbid obesity (HCC) 12/08/2007  .  Hypothyroidism 06/09/2006  . HYPERCHOLESTEROLEMIA 06/09/2006  . ANXIETY 06/09/2006  . PANIC DISORDER 06/09/2006  . Essential hypertension 06/09/2006  . GLOMERULONEPHRITIS 06/09/2006  . Systemic lupus erythematosus (HCC) 06/09/2006  . DVT, HX OF 06/09/2006   Past Medical History:  Diagnosis Date  . Anxiety state, unspecified    panic attacks  . Arthritis    Lupus - hands/knees  . DVT (deep venous thrombosis) (HCC)   . Edema   . 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 GERD (gastroesophageal reflux disease)    on prilosec r/t gastric sleeve surgery  . HLD (hyperlipidemia)   . Iron deficiency anemia    IV dextran [place]  . Nephritis and nephropathy, not specified as  acute or chronic, with unspecified pathological lesion in kidney   . Nonspecific abnormal results of liver function study   . Obesity, unspecified   . Other pulmonary embolism and infarction   . Other specified acquired hypothyroidism   . Panic disorder without agoraphobia   . Personal history of venous thrombosis and embolism 2002   during pregnancy; ?factor 5 leiden (sees heme)  . Pleural effusion 2005   c/w lupus initial w/u; recurrent Right as pred tapered off July 2011  . Pure hypercholesterolemia   . Sleep apnea   . 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 Unspecified essential hypertension   . Unspecified pleural effusion    Past Surgical History:  Procedure Laterality Date  . gastric sleeve  8/12   bariatric surgery Dr Adolphus Birchwood   . LAPAROSCOPIC TUBAL LIGATION  12/09/2010   Procedure: LAPAROSCOPIC TUBAL LIGATION;  Surgeon: Juluis Mire;  Location: [facility];  Service: Gynecology;  Laterality: Bilateral;  Attempted see Nursing note  . pleurocentesis  10/2004  . Pleuryx catheter placement  9/11    VanTright----removed 9/11  . RENAL BIOPSY  09/24/2012  . svd     x 3  . THORACENTESIS  2010   with penumonia  . WISDOM TOOTH EXTRACTION     Social History   Tobacco Use  . Smoking status: Former Smoker    Packs/day: 0.50    Years: 10.00    Pack years: 5.00    Types: Cigarettes    Last attempt to quit: 05/04/2009    Years since quitting: 8.3  . Smokeless tobacco: Never Used  . Tobacco comment: Socially x10 years (1 pack/month)  Substance Use Topics  . Alcohol use: Yes    Alcohol/week: 0.0 oz    Comment: rare  . Drug use: No   Family History  Problem Relation Age of Onset  . Lupus Sister   . Hypertension Father   . Hyperlipidemia Father   . Leukemia Sister   . Diabetes Unknown        GM   Allergies  Allergen Reactions  . Lisinopril Cough  . Lovenox [Enoxaparin Sodium] Itching  . Moxifloxacin Other (See Comments)     REACTION: hallucinations  . Amoxicillin Other (See Comments)    unknown   Current Outpatient Medications on File Prior to Visit  Medication Sig Dispense Refill  . BIOTIN PO Take 1 capsule by mouth daily.    . Cyanocobalamin (VITAMIN B-12 PO) Take 1 capsule by mouth daily.    . hydroxychloroquine (PLAQUENIL) 200 MG tablet Take 200 mg by mouth 2 (two) times daily.    Marland Kitchen  IRON PO Take 1 tablet by mouth daily.    Marland Kitchen labetalol (NORMODYNE) 200 MG tablet Take 1 and 1/2 tablets  by mouth twice a day 270 tablet 3  . levothyroxine (SYNTHROID, LEVOTHROID) 175 MCG tablet Take 1 tablet (175 mcg total) by mouth daily. 90 tablet 3  . Multiple Vitamin (MULTIVITAMIN) tablet Take 1 tablet by mouth daily.    Marland Kitchen omeprazole (PRILOSEC) 20 MG capsule Take 1 capsule (20 mg total) by mouth daily. 90 capsule 3  . PARoxetine (PAXIL) 40 MG tablet Take 1 tablet (40 mg total) by mouth every morning. 90 tablet 3  . rosuvastatin (CRESTOR) 20 MG tablet Take 1 tablet (20 mg total) by mouth daily. 90 tablet 0  . warfarin (COUMADIN) 7.5 MG tablet TAKE AS DIRECTED BY ANTICOAGULATION CLINIC 90 tablet 2  . warfarin (COUMADIN) 7.5 MG tablet Take 1 1/2 tablets daily except 2 tablets on Wednesday or AS DIRECTED BY ANTICOAGULATION CLINIC. 180 tablet 1   Current Facility-Administered Medications on File Prior to Visit  Medication Dose Route Frequency Provider Last Rate Last Dose  . cyanocobalamin ((VITAMIN B-12)) injection 1,000 mcg  1,000 mcg Intramuscular Q30 days Tower, Audrie Gallus, MD   1,000 mcg at 12/12/16 0925  . cyanocobalamin ((VITAMIN B-12)) injection 1,000 mcg  1,000 mcg Intramuscular Once Corwin Levins, MD          Review of Systems  Constitutional: Negative for activity change, appetite change, fatigue, fever and unexpected weight change.  HENT: Negative for congestion, ear pain, rhinorrhea, sinus pressure and sore throat.   Eyes: Negative for pain, redness and visual disturbance.  Respiratory: Negative for cough, shortness  of breath and wheezing.   Cardiovascular: Negative for chest pain and palpitations.  Gastrointestinal: Negative for abdominal pain, blood in stool, constipation and diarrhea.  Endocrine: Negative for polydipsia and polyuria.  Genitourinary: Negative for dysuria, frequency and urgency.  Musculoskeletal: Positive for neck stiffness. Negative for arthralgias, back pain and myalgias.       Neck tension and stiffness at times   Skin: Negative for pallor and rash.  Allergic/Immunologic: Negative for environmental allergies.  Neurological: Positive for headaches. Negative for dizziness, tremors, seizures, syncope, facial asymmetry, speech difficulty, weakness, light-headedness and numbness.  Hematological: Negative for adenopathy. Does not bruise/bleed easily.  Psychiatric/Behavioral: Negative for decreased concentration and dysphoric mood. The patient is not nervous/anxious.        Objective:   Physical Exam  Constitutional: She is oriented to person, place, and time. She appears well-developed and well-nourished. No distress.  Morbidly obese and well appearing   HENT:  Head: Normocephalic and atraumatic.  Right Ear: External ear normal.  Left Ear: External ear normal.  Nose: Nose normal.  Mouth/Throat: Oropharynx is clear and moist. No oropharyngeal exudate.  No sinus tenderness No temporal tenderness  No TMJ tenderness  Nares are boggy  Eyes: Pupils are equal, round, and reactive to light. Conjunctivae and EOM are normal. Right eye exhibits no discharge. Left eye exhibits no discharge. No scleral icterus.  No nystagmus  Neck: Normal range of motion and full passive range of motion without pain. Neck supple. No JVD present. Carotid bruit is not present. No tracheal deviation present. No thyromegaly present.  Cardiovascular: Normal rate and regular rhythm.  Murmur heard. AS murmur  Pulmonary/Chest: Effort normal and breath sounds normal. No respiratory distress. She has no wheezes. She  has no rales.  Abdominal: Soft. Bowel sounds are normal. She exhibits no distension and no mass. There is no tenderness.  Musculoskeletal: She exhibits no edema or tenderness.  Some tightness/tenderness of neck muscles   Lymphadenopathy:    She has no cervical adenopathy.  Neurological: She is alert and oriented to person, place, and time. She has normal strength and normal reflexes. She displays no atrophy and no tremor. No cranial nerve deficit or sensory deficit. She exhibits normal muscle tone. She displays a negative Romberg sign. Coordination and gait normal.  No focal cerebellar signs   Skin: Skin is warm and dry. No rash noted. No pallor.  Psychiatric: She has a normal mood and affect. Her behavior is normal. Thought content normal.  Cheerful and talkative           Assessment & Plan:   Problem List Items Addressed This Visit      Other   Headache - Primary    Ongoing - tension type that can transform to migraine  Now daily with a pattern (does have breaks in between) Per pt -not same type of HA she had with brain hemorrhage (and no assoc neuro symptoms) Disc lifestyle change in detail for HA prev  Cut caffeine and inc water intake  In past flexeril works-will try robaxin instead since this is less sedating  If unable to break cycle-disc trial of gabapentin  Also low threshold to image if worse or not imp due to ICH hx   (disc s/s to watch for)  Update if not starting to improve in a week or if worsening   >25 minutes spent in face to face time with patient, >50% spent in counselling or coordination of care including long disc of lifestyle change for HA prev as well as detailed disc of medications avail and pot side eff       Relevant Medications   methocarbamol (ROBAXIN) 500 MG tablet   Morbid obesity (HCC)

## 2017-09-04 NOTE — Patient Instructions (Addendum)
To help prevent headaches More water Start cutting caffeine by 1 drink per week  Try to go to bed and get up at the same time every day If you need more sleep - start going to bad earlier   Make sure you have a good supportive pillow and your neck muscles are relaxed   Ice on head during a headache is great  Heat on neck to relax muscles helps too  Try robaxin instead of flexeril   Let me know in a week how you are  We may try gabapentin next for prevention if needed  Also - low threshold to image if not improving  If you worsen at any point- let me know

## 2017-09-06 NOTE — Assessment & Plan Note (Signed)
Ongoing - tension type that can transform to migraine  Now daily with a pattern (does have breaks in between) Per pt -not same type of HA she had with brain hemorrhage (and no assoc neuro symptoms) Disc lifestyle change in detail for HA prev  Cut caffeine and inc water intake  In past flexeril works-will try robaxin instead since this is less sedating  If unable to break cycle-disc trial of gabapentin  Also low threshold to image if worse or not imp due to ICH hx   (disc s/s to watch for)  Update if not starting to improve in a week or if worsening   >25 minutes spent in face to face time with patient, >50% spent in counselling or coordination of care including long disc of lifestyle change for HA prev as well as detailed disc of medications avail and pot side eff

## 2017-09-08 ENCOUNTER — Ambulatory Visit: Payer: Managed Care, Other (non HMO)

## 2017-09-22 ENCOUNTER — Ambulatory Visit (INDEPENDENT_AMBULATORY_CARE_PROVIDER_SITE_OTHER): Payer: Managed Care, Other (non HMO) | Admitting: General Practice

## 2017-09-22 DIAGNOSIS — Z7901 Long term (current) use of anticoagulants: Secondary | ICD-10-CM | POA: Diagnosis not present

## 2017-09-22 DIAGNOSIS — E538 Deficiency of other specified B group vitamins: Secondary | ICD-10-CM

## 2017-09-22 DIAGNOSIS — D6851 Activated protein C resistance: Secondary | ICD-10-CM

## 2017-09-22 DIAGNOSIS — Z86711 Personal history of pulmonary embolism: Secondary | ICD-10-CM

## 2017-09-22 LAB — POCT INR: INR: 3.4 — AB (ref 2.0–3.0)

## 2017-09-22 MED ORDER — CYANOCOBALAMIN 1000 MCG/ML IJ SOLN
1000.0000 ug | Freq: Once | INTRAMUSCULAR | Status: AC
  Administered 2017-09-22: 1000 ug via INTRAMUSCULAR

## 2017-09-22 NOTE — Patient Instructions (Addendum)
Pre visit review using our clinic review tool, if applicable. No additional management support is needed unless otherwise documented below in the visit note.  Continue to take 1 1/2 tablets all days except 2 tablets on Mondays.  Re-check in 4 weeks.

## 2017-10-01 ENCOUNTER — Other Ambulatory Visit: Payer: Self-pay | Admitting: General Practice

## 2017-10-01 MED ORDER — WARFARIN SODIUM 7.5 MG PO TABS: ORAL_TABLET | ORAL | 1 refills | Status: DC

## 2017-10-16 ENCOUNTER — Other Ambulatory Visit: Payer: Self-pay | Admitting: *Deleted

## 2017-10-16 MED ORDER — LABETALOL HCL 200 MG PO TABS: ORAL_TABLET | ORAL | 0 refills | Status: DC

## 2017-10-16 MED ORDER — LEVOTHYROXINE SODIUM 175 MCG PO TABS: 175.0000 ug | ORAL_TABLET | Freq: Every day | ORAL | 0 refills | Status: DC

## 2017-10-16 NOTE — Telephone Encounter (Signed)
I refilled once  Please schedule PE or f/u with lab prior  Thanks

## 2017-10-16 NOTE — Telephone Encounter (Signed)
Pt had an acute appt (HA) on 09/04/17, but no recent f/u or CPE and no TSH labs in over a year, please advise

## 2017-10-19 NOTE — Telephone Encounter (Signed)
Carrie will reach out to pt to try and get CPE scheduled  

## 2017-10-20 ENCOUNTER — Ambulatory Visit: Payer: Managed Care, Other (non HMO)

## 2017-10-22 ENCOUNTER — Other Ambulatory Visit: Payer: Self-pay | Admitting: Family Medicine

## 2017-10-22 NOTE — Telephone Encounter (Signed)
Pt never returned our calls to schedule her CPE per Dr. Milinda Antisower. Rx declined until appt is scheduled

## 2017-10-23 ENCOUNTER — Ambulatory Visit (INDEPENDENT_AMBULATORY_CARE_PROVIDER_SITE_OTHER): Payer: Managed Care, Other (non HMO) | Admitting: General Practice

## 2017-10-23 DIAGNOSIS — D6851 Activated protein C resistance: Secondary | ICD-10-CM

## 2017-10-23 DIAGNOSIS — Z86711 Personal history of pulmonary embolism: Secondary | ICD-10-CM

## 2017-10-23 DIAGNOSIS — Z7901 Long term (current) use of anticoagulants: Secondary | ICD-10-CM | POA: Diagnosis not present

## 2017-10-23 DIAGNOSIS — E538 Deficiency of other specified B group vitamins: Secondary | ICD-10-CM

## 2017-10-23 LAB — POCT INR: INR: 4 — AB (ref 2.0–3.0)

## 2017-10-23 MED ORDER — CYANOCOBALAMIN 1000 MCG/ML IJ SOLN
1000.0000 ug | Freq: Once | INTRAMUSCULAR | Status: AC
  Administered 2017-10-23: 1000 ug via INTRAMUSCULAR

## 2017-10-23 NOTE — Patient Instructions (Addendum)
Pre visit review using our clinic review tool, if applicable. No additional management support is needed unless otherwise documented below in the visit note.  Hold coumadin today and then change dosage and take 1 1/2 tablets all days.  Re-check in 4 weeks.

## 2017-11-06 MED ORDER — PAROXETINE HCL 40 MG PO TABS: 40.0000 mg | ORAL_TABLET | Freq: Every morning | ORAL | 0 refills | Status: DC

## 2017-11-06 NOTE — Addendum Note (Signed)
Addended by: Patience Musca on: 11/06/2017 01:39 PM   Modules accepted: Orders

## 2017-11-06 NOTE — Telephone Encounter (Signed)
Unable to reach pt by phone and per DPR left v/m that medication requested for refill had been sent to [facility] [place].pt scheduled CPX on 11/16/17.refilled per protocol x 1.

## 2017-11-06 NOTE — Telephone Encounter (Signed)
Patient is scheduled for her cpe 10/14. Can this be refilled until appointment? Please contact patient. [phone]

## 2017-11-09 ENCOUNTER — Telehealth: Payer: Self-pay | Admitting: Family Medicine

## 2017-11-09 DIAGNOSIS — I10 Essential (primary) hypertension: Secondary | ICD-10-CM

## 2017-11-09 DIAGNOSIS — E538 Deficiency of other specified B group vitamins: Secondary | ICD-10-CM

## 2017-11-09 DIAGNOSIS — E039 Hypothyroidism, unspecified: Secondary | ICD-10-CM

## 2017-11-09 DIAGNOSIS — E7800 Pure hypercholesterolemia, unspecified: Secondary | ICD-10-CM

## 2017-11-09 DIAGNOSIS — R739 Hyperglycemia, unspecified: Secondary | ICD-10-CM

## 2017-11-09 NOTE — Telephone Encounter (Signed)
-----   Message from Alvina Chou sent at 11/09/2017 11:21 AM EDT ----- Regarding: Lab orders for Thursday, 10.10.19 Patient is scheduled for CPX labs, please order future labs, Thanks , Camelia Eng

## 2017-11-12 ENCOUNTER — Other Ambulatory Visit (INDEPENDENT_AMBULATORY_CARE_PROVIDER_SITE_OTHER): Payer: Managed Care, Other (non HMO)

## 2017-11-12 DIAGNOSIS — E039 Hypothyroidism, unspecified: Secondary | ICD-10-CM | POA: Diagnosis not present

## 2017-11-12 DIAGNOSIS — R739 Hyperglycemia, unspecified: Secondary | ICD-10-CM | POA: Diagnosis not present

## 2017-11-12 DIAGNOSIS — E538 Deficiency of other specified B group vitamins: Secondary | ICD-10-CM | POA: Diagnosis not present

## 2017-11-12 DIAGNOSIS — E7800 Pure hypercholesterolemia, unspecified: Secondary | ICD-10-CM

## 2017-11-12 DIAGNOSIS — I10 Essential (primary) hypertension: Secondary | ICD-10-CM | POA: Diagnosis not present

## 2017-11-12 LAB — CBC WITH DIFFERENTIAL/PLATELET
Basophils Absolute: 0 10*3/uL (ref 0.0–0.1)
Basophils Relative: 0.7 % (ref 0.0–3.0)
EOS PCT: 5 % (ref 0.0–5.0)
Eosinophils Absolute: 0.2 10*3/uL (ref 0.0–0.7)
HCT: 40.9 % (ref 36.0–46.0)
Hemoglobin: 13.3 g/dL (ref 12.0–15.0)
LYMPHS ABS: 1.2 10*3/uL (ref 0.7–4.0)
Lymphocytes Relative: 26.6 % (ref 12.0–46.0)
MCHC: 32.5 g/dL (ref 30.0–36.0)
MCV: 86.6 fl (ref 78.0–100.0)
MONO ABS: 0.3 10*3/uL (ref 0.1–1.0)
Monocytes Relative: 7.7 % (ref 3.0–12.0)
NEUTROS ABS: 2.6 10*3/uL (ref 1.4–7.7)
NEUTROS PCT: 60 % (ref 43.0–77.0)
PLATELETS: 194 10*3/uL (ref 150.0–400.0)
RBC: 4.73 Mil/uL (ref 3.87–5.11)
RDW: 15.3 % (ref 11.5–15.5)
WBC: 4.4 10*3/uL (ref 4.0–10.5)

## 2017-11-12 LAB — LIPID PANEL
CHOLESTEROL: 182 mg/dL (ref 0–200)
HDL: 43.9 mg/dL (ref >39.00)
LDL Cholesterol: 108 mg/dL — ABNORMAL HIGH (ref 0–99)
NONHDL: 137.86
Total CHOL/HDL Ratio: 4
Triglycerides: 151 mg/dL — ABNORMAL HIGH (ref 0.0–149.0)
VLDL: 30.2 mg/dL (ref 0.0–40.0)

## 2017-11-12 LAB — COMPREHENSIVE METABOLIC PANEL
ALK PHOS: 75 U/L (ref 39–117)
ALT: 16 U/L (ref 0–35)
AST: 18 U/L (ref 0–37)
Albumin: 4.1 g/dL (ref 3.5–5.2)
BUN: 10 mg/dL (ref 6–23)
CO2: 31 mEq/L (ref 19–32)
Calcium: 9.3 mg/dL (ref 8.4–10.5)
Chloride: 104 mEq/L (ref 96–112)
Creatinine, Ser: 0.98 mg/dL (ref 0.40–1.20)
GFR: 63.52 mL/min (ref >60.00)
GLUCOSE: 91 mg/dL (ref 70–99)
POTASSIUM: 4.6 meq/L (ref 3.5–5.1)
Sodium: 140 mEq/L (ref 135–145)
TOTAL PROTEIN: 7.3 g/dL (ref 6.0–8.3)
Total Bilirubin: 0.3 mg/dL (ref 0.2–1.2)

## 2017-11-12 LAB — HEMOGLOBIN A1C: HEMOGLOBIN A1C: 5.5 % (ref 4.6–6.5)

## 2017-11-12 LAB — VITAMIN B12: VITAMIN B 12: 1022 pg/mL — AB (ref 211–911)

## 2017-11-12 LAB — TSH: TSH: 0.23 u[IU]/mL — AB (ref 0.35–4.50)

## 2017-11-15 ENCOUNTER — Other Ambulatory Visit: Payer: Self-pay | Admitting: Family Medicine

## 2017-11-16 ENCOUNTER — Ambulatory Visit (INDEPENDENT_AMBULATORY_CARE_PROVIDER_SITE_OTHER): Payer: Managed Care, Other (non HMO) | Admitting: Family Medicine

## 2017-11-16 ENCOUNTER — Encounter: Payer: Self-pay | Admitting: Family Medicine

## 2017-11-16 VITALS — BP 124/78 | HR 72 | Temp 98.6°F | Ht 64.0 in | Wt 305.8 lb

## 2017-11-16 DIAGNOSIS — Z23 Encounter for immunization: Secondary | ICD-10-CM | POA: Diagnosis not present

## 2017-11-16 DIAGNOSIS — R51 Headache: Secondary | ICD-10-CM

## 2017-11-16 DIAGNOSIS — F411 Generalized anxiety disorder: Secondary | ICD-10-CM

## 2017-11-16 DIAGNOSIS — R519 Headache, unspecified: Secondary | ICD-10-CM

## 2017-11-16 DIAGNOSIS — E538 Deficiency of other specified B group vitamins: Secondary | ICD-10-CM

## 2017-11-16 DIAGNOSIS — I10 Essential (primary) hypertension: Secondary | ICD-10-CM | POA: Diagnosis not present

## 2017-11-16 DIAGNOSIS — E890 Postprocedural hypothyroidism: Secondary | ICD-10-CM

## 2017-11-16 DIAGNOSIS — R739 Hyperglycemia, unspecified: Secondary | ICD-10-CM

## 2017-11-16 DIAGNOSIS — Z0000 Encounter for general adult medical examination without abnormal findings: Secondary | ICD-10-CM

## 2017-11-16 DIAGNOSIS — I350 Nonrheumatic aortic (valve) stenosis: Secondary | ICD-10-CM

## 2017-11-16 DIAGNOSIS — N182 Chronic kidney disease, stage 2 (mild): Secondary | ICD-10-CM

## 2017-11-16 DIAGNOSIS — M3213 Lung involvement in systemic lupus erythematosus: Secondary | ICD-10-CM

## 2017-11-16 DIAGNOSIS — D6851 Activated protein C resistance: Secondary | ICD-10-CM

## 2017-11-16 DIAGNOSIS — M3214 Glomerular disease in systemic lupus erythematosus: Secondary | ICD-10-CM

## 2017-11-16 DIAGNOSIS — E7800 Pure hypercholesterolemia, unspecified: Secondary | ICD-10-CM

## 2017-11-16 MED ORDER — LEVOTHYROXINE SODIUM 150 MCG PO TABS: 150.0000 ug | ORAL_TABLET | Freq: Every day | ORAL | 3 refills | Status: DC

## 2017-11-16 MED ORDER — ROSUVASTATIN CALCIUM 20 MG PO TABS
20.0000 mg | ORAL_TABLET | Freq: Every day | ORAL | 3 refills | Status: DC
Start: 2017-11-16 — End: 2018-11-30

## 2017-11-16 MED ORDER — PAROXETINE HCL 40 MG PO TABS: 40.0000 mg | ORAL_TABLET | Freq: Every morning | ORAL | 3 refills | Status: DC

## 2017-11-16 NOTE — Progress Notes (Signed)
Subjective:    Patient ID: [name], female    DOB: 1965/10/07, 52 y.o.   MRN: [id number]  HPI Here for health maintenance exam and to review chronic medical problems    Wt Readings from Last 3 Encounters:  11/16/17 (!) 305 lb 12 oz (138.7 kg)  09/04/17 (!) 311 lb (141.1 kg)  08/14/17 (!) 306 lb 8 oz (139 kg)  wt is down 6 lb- is really trying to loose  For a while had nausea with headache-better now  Exercise - walking  52.48 kg/m   Mammogram - [facility] (last one a year ago)  Self breast exam   Colon cancer screening -- had a colonoscopy  5 year recall   Pap ? A year ago  appt tomorrow at [facility] - Dr Arelia Sneddon   Flu shot - given today   Tetanus shot 1/16   dexa 10/15 = BMD in normal range   Zoster status = declines zoster vaccine    bp is stable today  No cp or palpitations or headaches or edema  No side effects to medicines  BP Readings from Last 3 Encounters:  11/16/17 124/78  09/04/17 128/78  08/14/17 124/80     Hypothyroidism  Pt has no clinical changes No change in energy level/ hair or skin/ edema and no tremor Lab Results  Component Value Date   TSH 0.23 (L) 11/12/2017     Takes coumadin for factor 5 leiden and hx of DVT and PE   H/o gen anx disorder Takes paxil - doing ok with it  Would like to come off of it at some point  Mood is ok  Blunts emotions a bit   Prediabetes Lab Results  Component Value Date   HGBA1C 5.5 11/12/2017  down from 5.6   Hyperlipidemia  Lab Results  Component Value Date   CHOL 182 11/12/2017   CHOL 185 10/03/2016   CHOL 201 (H) 11/14/2015   Lab Results  Component Value Date   HDL 43.90 11/12/2017   HDL 48.00 10/03/2016   HDL 43.50 11/14/2015   Lab Results  Component Value Date   LDLCALC 108 (H) 11/12/2017   LDLCALC 116 (H) 10/03/2016   LDLCALC 137 (H) 11/14/2015   Lab Results  Component Value Date   TRIG 151.0 (H) 11/12/2017   TRIG 106.0  10/03/2016   TRIG 105.0 11/14/2015   Lab Results  Component Value Date   CHOLHDL 4 11/12/2017   CHOLHDL 4 10/03/2016   CHOLHDL 5 11/14/2015   Lab Results  Component Value Date   LDLDIRECT 203.7 06/03/2011   LDLDIRECT 241.9 07/09/2009   LDLDIRECT 184.8 12/25/2008  crestor and diet   B12 def Lab Results  Component Value Date   VITAMINB12 1,022 (H) 11/12/2017    Chronic headaches are getting worse and worse  Has them every day   Lab Results  Component Value Date   CREATININE 0.98 11/12/2017   BUN 10 11/12/2017   NA 140 11/12/2017   K 4.6 11/12/2017   CL 104 11/12/2017   CO2 31 11/12/2017   Lab Results  Component Value Date   ALT 16 11/12/2017   AST 18 11/12/2017   ALKPHOS 75 11/12/2017   BILITOT 0.3 11/12/2017    Lab Results  Component Value Date   WBC 4.4 11/12/2017   HGB 13.3 11/12/2017   HCT 40.9 11/12/2017   MCV 86.6 11/12/2017   PLT 194.0 11/12/2017  Patient Active Problem List   Diagnosis Date Noted  . Aortic stenosis 08/15/2017  . Vitamin B12 deficiency 11/15/2015  . Muscle cramps 11/14/2015  . Allergic rhinitis 07/01/2015  . History of cerebral hemorrhage 07/01/2015  . Chronic daily headache 06/29/2015  . Screening for osteoporosis 11/04/2013  . Pain, eye, right 10/25/2013  . Encounter for therapeutic drug monitoring 03/25/2013  . CKD (chronic kidney disease) stage 2, GFR 60-89 ml/min 12/20/2012  . 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 Routine general medical examination at a health care facility 12/07/2012  . Steroid long-term use 12/07/2012  . History of HPV infection 12/07/2012  . Long term (current) use of anticoagulants 10/06/2012  . Chronic anticoagulation 04/22/2012  . S/P bariatric surgery 04/22/2012  . Factor V Leiden (HCC) 04/22/2012  . Urine abnormality 04/16/2012  . Neck pain 02/23/2012  . OSA on CPAP 11/20/2011  . Hyperglycemia 09/16/2010  . EDEMA 01/08/2010  . PLEURAL EFFUSION 08/27/2009  . ENLARGEMENT OF LYMPH  NODES 08/13/2009  . History of pulmonary embolism 05/24/2009  . Morbid obesity (HCC) 12/08/2007  . Hypothyroidism 06/09/2006  . HYPERCHOLESTEROLEMIA 06/09/2006  . Generalized anxiety disorder 06/09/2006  . PANIC DISORDER 06/09/2006  . Essential hypertension 06/09/2006  . GLOMERULONEPHRITIS 06/09/2006  . Systemic lupus erythematosus (HCC) 06/09/2006  . DVT, HX OF 06/09/2006   Past Medical History:  Diagnosis Date  . Anxiety state, unspecified    panic attacks  . Arthritis    Lupus - hands/knees  . DVT (deep venous thrombosis) (HCC)   . Edema   . 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 GERD (gastroesophageal reflux disease)    on prilosec r/t gastric sleeve surgery  . HLD (hyperlipidemia)   . Iron deficiency anemia    IV dextran Coladonato  . Nephritis and nephropathy, not specified as acute or chronic, with unspecified pathological lesion in kidney   . Nonspecific abnormal results of liver function study   . Obesity, unspecified   . Other pulmonary embolism and infarction   . Other specified acquired hypothyroidism   . Panic disorder without agoraphobia   . Personal history of venous thrombosis and embolism 2002   during pregnancy; ?factor 5 leiden (sees heme)  . Pleural effusion 2005   c/w lupus initial w/u; recurrent Right as pred tapered off July 2011  . Pure hypercholesterolemia   . Sleep apnea   . 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 Unspecified essential hypertension   . Unspecified pleural effusion    Past Surgical History:  Procedure Laterality Date  . gastric sleeve  8/12   bariatric surgery Dr Adolphus Birchwood   . LAPAROSCOPIC TUBAL LIGATION  12/09/2010   Procedure: LAPAROSCOPIC TUBAL LIGATION;  Surgeon: Juluis Mire;  Location: [facility];  Service: Gynecology;  Laterality: Bilateral;  Attempted see Nursing note  .  pleurocentesis  10/2004  . Pleuryx catheter placement  9/11    VanTright----removed 9/11  . RENAL BIOPSY  09/24/2012  . svd     x 3  . THORACENTESIS  2010   with penumonia  . WISDOM TOOTH EXTRACTION     Social History   Tobacco Use  . Smoking status: Former Smoker    Packs/day: 0.50    Years: 10.00    Pack years: 5.00    Types: Cigarettes    Last attempt  to quit: 05/04/2009    Years since quitting: 8.5  . Smokeless tobacco: Never Used  . Tobacco comment: Socially x10 years (1 pack/month)  Substance Use Topics  . Alcohol use: Yes    Alcohol/week: 0.0 standard drinks    Comment: rare  . Drug use: No   Family History  Problem Relation Age of Onset  . Lupus Sister   . Hypertension Father   . Hyperlipidemia Father   . Leukemia Sister   . Diabetes Unknown        GM   Allergies  Allergen Reactions  . Lisinopril Cough  . Lovenox [Enoxaparin Sodium] Itching  . Moxifloxacin Other (See Comments)    REACTION: hallucinations  . Amoxicillin Other (See Comments)    unknown   Current Outpatient Medications on File Prior to Visit  Medication Sig Dispense Refill  . BIOTIN PO Take 1 capsule by mouth daily.    . cyanocobalamin (,VITAMIN B-12,) 1000 MCG/ML injection Inject 1,000 mcg into the muscle. Every 60 days    . Cyanocobalamin (VITAMIN B-12 PO) Take 1 capsule by mouth daily.    . hydroxychloroquine (PLAQUENIL) 200 MG tablet Take 200 mg by mouth 2 (two) times daily.    . IRON PO Take 1 tablet by mouth daily.    Marland Kitchen labetalol (NORMODYNE) 200 MG tablet Take 1 and 1/2 tablets  by mouth twice a day 270 tablet 0  . methocarbamol (ROBAXIN) 500 MG tablet Take 1 tablet (500 mg total) by mouth every 8 (eight) hours as needed for muscle spasms. Headache, caution of sedation 60 tablet 1  . Multiple Vitamin (MULTIVITAMIN) tablet Take 1 tablet by mouth daily.    Marland Kitchen omeprazole (PRILOSEC) 20 MG capsule Take 1 capsule (20 mg total) by mouth daily. (Patient taking differently: Take 20 mg by mouth 2  (two) times daily. ) 90 capsule 3  . warfarin (COUMADIN) 7.5 MG tablet Take 1 1/2 tablets daily or AS DIRECTED BY ANTICOAGULATION CLINIC 180 tablet 1   Current Facility-Administered Medications on File Prior to Visit  Medication Dose Route Frequency Provider Last Rate Last Dose  . cyanocobalamin ((VITAMIN B-12)) injection 1,000 mcg  1,000 mcg Intramuscular Q30 days Tower, Audrie Gallus, MD   1,000 mcg at 12/12/16 1610    Review of Systems  Constitutional: Positive for fatigue. Negative for activity change, appetite change, fever and unexpected weight change.  HENT: Negative for congestion, ear pain, rhinorrhea, sinus pressure and sore throat.   Eyes: Negative for pain, redness and visual disturbance.  Respiratory: Negative for cough, shortness of breath and wheezing.   Cardiovascular: Positive for leg swelling. Negative for chest pain and palpitations.  Gastrointestinal: Negative for abdominal pain, blood in stool, constipation and diarrhea.  Endocrine: Negative for polydipsia and polyuria.  Genitourinary: Negative for dysuria, frequency and urgency.  Musculoskeletal: Positive for arthralgias. Negative for back pain, joint swelling and myalgias.  Skin: Negative for pallor and rash.       Skin color hyperpigmentation on ankles  Sores on arms-healing/ tries not to pick  Allergic/Immunologic: Negative for environmental allergies.  Neurological: Positive for headaches. Negative for dizziness, syncope, weakness and light-headedness.  Hematological: Negative for adenopathy. Does not bruise/bleed easily.  Psychiatric/Behavioral: Negative for decreased concentration and dysphoric mood. The patient is not nervous/anxious.        Objective:   Physical Exam  Constitutional: She appears well-developed and well-nourished. No distress.  Morbidly obese and well app  HENT:  Head: Normocephalic and atraumatic.  Right Ear: External ear normal.  Left Ear: External ear normal.  Nose: Nose normal.    Mouth/Throat: Oropharynx is clear and moist.  Eyes: Pupils are equal, round, and reactive to light. Conjunctivae and EOM are normal. Right eye exhibits no discharge. Left eye exhibits no discharge. No scleral icterus.  Neck: Normal range of motion. Neck supple. No JVD present. Carotid bruit is not present. No thyromegaly present.  Cardiovascular: Normal rate, regular rhythm and intact distal pulses. Exam reveals no gallop.  Murmur heard. Pulmonary/Chest: Effort normal and breath sounds normal. No respiratory distress. She has no wheezes. She has no rales.  Abdominal: Soft. Bowel sounds are normal. She exhibits no distension and no mass. There is no tenderness.  Genitourinary:  Genitourinary Comments: Gyn exam done by gyn office  Musculoskeletal: She exhibits edema. She exhibits no tenderness.  Trace ankle edema  Lymphadenopathy:    She has no cervical adenopathy.  Neurological: She is alert. She has normal reflexes. She displays normal reflexes. No cranial nerve deficit. She exhibits normal muscle tone. Coordination normal.  Skin: Skin is warm and dry. No rash noted. No erythema. No pallor.  Sores of different ages on arms with scars  Hyperpigmentation of ankles   Psychiatric: She has a normal mood and affect.  Pleasant           Assessment & Plan:   Problem List Items Addressed This Visit      Cardiovascular and Mediastinum   Aortic stenosis    M present No symptoms       Relevant Medications   rosuvastatin (CRESTOR) 20 MG tablet   Essential hypertension    bp in fair control at this time  BP Readings from Last 1 Encounters:  11/16/17 124/78   No changes needed Most recent labs reviewed  Disc lifstyle change with low sodium diet and exercise        Relevant Medications   rosuvastatin (CRESTOR) 20 MG tablet     Endocrine   Hypothyroidism    Lab Results  Component Value Date   TSH 0.23 (L) 11/12/2017   Will dec levothy dose to 150 mcg daily  Lab in 6  wk Not symptomatic       Relevant Medications   levothyroxine (SYNTHROID, LEVOTHROID) 150 MCG tablet     Genitourinary   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HCC)    Labs stable/renal For rheum f/u      CKD (chronic kidney disease) stage 2, GFR 60-89 ml/min    Labs are stable/reassuring  Disc imp of fluids and avoid nephrotoxins For rheum f/u for lupus        Hematopoietic and Hemostatic   Factor V Leiden (HCC)    With h/o dvt and PE Continues coumadin Lab Results  Component Value Date   INR 4.0 (A) 10/23/2017   INR 3.4 (A) 09/22/2017   INR 2.9 08/11/2017   PROTIME 15.6 (H) 12/06/2012   PROTIME 19.2 (H) 06/03/2012           Other   Chronic daily headache    Worse lately-unsure why Disc lifestyle habits Will ref to neuro for eval/tx      Relevant Medications   PARoxetine (PAXIL) 40 MG tablet   Other Relevant Orders   Ambulatory referral to Neurology   Generalized anxiety disorder    Continues paxil Would like to wean it soon  Will get TSH tx first  Then call when ready      Relevant Medications   PARoxetine (PAXIL) 40 MG tablet   HYPERCHOLESTEROLEMIA  Disc goals for lipids and reasons to control them Rev last labs with pt Rev low sat fat diet in detail Controlled with crestor and diet        Relevant Medications   rosuvastatin (CRESTOR) 20 MG tablet   Hyperglycemia    Lab Results  Component Value Date   HGBA1C 5.5 11/12/2017   Stable disc imp of low glycemic diet and wt loss to prevent DM2       Morbid obesity (HCC)    S/p bariatric surgery Discussed how this problem influences overall health and the risks it imposes  Reviewed plan for weight loss with lower calorie diet (via better food choices and also portion control or program like weight watchers) and exercise building up to or more than 30 minutes 5 days per week including some aerobic activity         Routine general medical examination at a health care facility - Primary    Reviewed  health habits including diet and exercise and skin cancer prevention Reviewed appropriate screening tests for age  Also reviewed health mt list, fam hx and immunization status , as well as social and family history   See HPI Labs reviewed  Flu shot given Declines shingrix vaccine  Enc further wt loss  Sent for pap and mammo report from gyn      Systemic lupus erythematosus (HCC)    Doing well currently on plaquenil  F/u with rheum soon      Vitamin B12 deficiency    Lab Results  Component Value Date   VITAMINB12 1,022 (H) 11/12/2017   Oral and inj Will dec freq of inj to every 8 weeks        Other Visit Diagnoses    Need for influenza vaccination       Relevant Orders   Flu Vaccine QUAD 6+ mos PF IM (Fluarix Quad PF) (Completed)

## 2017-11-16 NOTE — Assessment & Plan Note (Signed)
Continues paxil Would like to wean it soon  Will get TSH tx first  Then call when ready

## 2017-11-16 NOTE — Assessment & Plan Note (Signed)
Lab Results  Component Value Date   HGBA1C 5.5 11/12/2017   Stable disc imp of low glycemic diet and wt loss to prevent DM2

## 2017-11-16 NOTE — Assessment & Plan Note (Signed)
bp in fair control at this time  BP Readings from Last 1 Encounters:  11/16/17 124/78   No changes needed Most recent labs reviewed  Disc lifstyle change with low sodium diet and exercise

## 2017-11-16 NOTE — Assessment & Plan Note (Signed)
Lab Results  Component Value Date   TSH 0.23 (L) 11/12/2017   Will dec levothy dose to 150 mcg daily  Lab in 6 wk Not symptomatic

## 2017-11-16 NOTE — Patient Instructions (Addendum)
Please ask your gyn to send a copy of mammogram and last pap (reports)    We need to decrease thyroid dose to 150 mcg  We need to re check TSH in 6 weeks   Start getting B12 shots every 2 months since level is high   Take care of yourself  Keep walking ! Eat a healthy diet   We will refer you to neurology for headaches

## 2017-11-16 NOTE — Assessment & Plan Note (Signed)
With h/o dvt and PE Continues coumadin Lab Results  Component Value Date   INR 4.0 (A) 10/23/2017   INR 3.4 (A) 09/22/2017   INR 2.9 08/11/2017   PROTIME 15.6 (H) 12/06/2012   PROTIME 19.2 (H) 06/03/2012

## 2017-11-16 NOTE — Assessment & Plan Note (Signed)
Doing well currently on plaquenil  F/u with rheum soon

## 2017-11-16 NOTE — Assessment & Plan Note (Signed)
Lab Results  Component Value Date   VITAMINB12 1,022 (H) 11/12/2017   Oral and inj Will dec freq of inj to every 8 weeks

## 2017-11-16 NOTE — Assessment & Plan Note (Signed)
Disc goals for lipids and reasons to control them Rev last labs with pt Rev low sat fat diet in detail Controlled with crestor and diet  

## 2017-11-16 NOTE — Assessment & Plan Note (Signed)
Worse lately-unsure why Disc lifestyle habits Will ref to neuro for eval/tx

## 2017-11-16 NOTE — Assessment & Plan Note (Signed)
Labs stable/renal For rheum f/u

## 2017-11-16 NOTE — Assessment & Plan Note (Signed)
S/p bariatric surgery Discussed how this problem influences overall health and the risks it imposes  Reviewed plan for weight loss with lower calorie diet (via better food choices and also portion control or program like weight watchers) and exercise building up to or more than 30 minutes 5 days per week including some aerobic activity

## 2017-11-16 NOTE — Assessment & Plan Note (Signed)
Labs are stable/reassuring  Disc imp of fluids and avoid nephrotoxins For rheum f/u for lupus

## 2017-11-16 NOTE — Assessment & Plan Note (Signed)
Reviewed health habits including diet and exercise and skin cancer prevention Reviewed appropriate screening tests for age  Also reviewed health mt list, fam hx and immunization status , as well as social and family history   See HPI Labs reviewed  Flu shot given Declines shingrix vaccine  Enc further wt loss  Sent for pap and mammo report from gyn

## 2017-11-16 NOTE — Assessment & Plan Note (Signed)
M present No symptoms

## 2017-11-20 ENCOUNTER — Ambulatory Visit (INDEPENDENT_AMBULATORY_CARE_PROVIDER_SITE_OTHER): Payer: Managed Care, Other (non HMO) | Admitting: General Practice

## 2017-11-20 DIAGNOSIS — Z7901 Long term (current) use of anticoagulants: Secondary | ICD-10-CM

## 2017-11-20 DIAGNOSIS — E538 Deficiency of other specified B group vitamins: Secondary | ICD-10-CM | POA: Diagnosis not present

## 2017-11-20 LAB — POCT INR: INR: 4 — AB (ref 2.0–3.0)

## 2017-11-20 MED ORDER — CYANOCOBALAMIN 1000 MCG/ML IJ SOLN
1000.0000 ug | Freq: Once | INTRAMUSCULAR | Status: AC
  Administered 2017-11-20: 1000 ug via INTRAMUSCULAR

## 2017-11-20 NOTE — Patient Instructions (Addendum)
Pre visit review using our clinic review tool, if applicable. No additional management support is needed unless otherwise documented below in the visit note.  Hold coumadin today and then change dosage and take 1 1/2 tablets all days except 1 tablet on Fridays.   Re-check in 6  weeks. Pt will need B-12

## 2018-01-11 ENCOUNTER — Other Ambulatory Visit: Payer: Self-pay | Admitting: *Deleted

## 2018-01-11 ENCOUNTER — Encounter: Payer: Self-pay | Admitting: Internal Medicine

## 2018-01-11 ENCOUNTER — Ambulatory Visit (INDEPENDENT_AMBULATORY_CARE_PROVIDER_SITE_OTHER): Payer: Managed Care, Other (non HMO) | Admitting: Internal Medicine

## 2018-01-11 VITALS — BP 118/68 | HR 79 | Ht 64.0 in | Wt 310.0 lb

## 2018-01-11 DIAGNOSIS — I350 Nonrheumatic aortic (valve) stenosis: Secondary | ICD-10-CM | POA: Diagnosis not present

## 2018-01-11 DIAGNOSIS — Z13820 Encounter for screening for osteoporosis: Secondary | ICD-10-CM | POA: Diagnosis not present

## 2018-01-11 DIAGNOSIS — I10 Essential (primary) hypertension: Secondary | ICD-10-CM | POA: Diagnosis not present

## 2018-01-11 DIAGNOSIS — E782 Mixed hyperlipidemia: Secondary | ICD-10-CM

## 2018-01-11 DIAGNOSIS — D6859 Other primary thrombophilia: Secondary | ICD-10-CM

## 2018-01-11 MED ORDER — LABETALOL HCL 200 MG PO TABS: ORAL_TABLET | ORAL | 2 refills | Status: DC

## 2018-01-11 NOTE — Progress Notes (Addendum)
Cardiology Office Note   Date:  01/11/2018   ID:  [name], DOB 11/12/1965, MRN [id number]  PCP:  Judy Pimpleower, Marne A, MD  Cardiologist:   Dietrich PatesPaula Tevyn Codd, MD   F/u of AOrtic stenosis   History of Present Illness: [name] is a 52 y.o. female with a history of factor V leiden  Hx of PE and DVT as well as intracerebral hemorrhage  Sees Drs Milinda Antisower, colodonato and [place].  Mild to mod AS on echo  I last saw the pt  in  Jan 2018   She has been seen by B Bhagat since     She denies CP   No dizzines   Breathing is OK  No PND Biggest complaint is migraine HA   Being evaluated by Dr Haskell RilingFreedman      Echo on 12/2016  Mean gradient was 25 m across AV  Relatively unchanged from previous   Pumping normal     Current Meds  Medication Sig  . BIOTIN PO Take 1 capsule by mouth daily.  . cyanocobalamin (,VITAMIN B-12,) 1000 MCG/ML injection Inject 1,000 mcg into the muscle. Every 60 days  . Cyanocobalamin (VITAMIN B-12 PO) Take 1 capsule by mouth daily.  . hydroxychloroquine (PLAQUENIL) 200 MG tablet Take 200 mg by mouth 2 (two) times daily.  . IRON PO Take 1 tablet by mouth daily.  Marland Kitchen. labetalol (NORMODYNE) 200 MG tablet Take 1 and 1/2 tablets  by mouth twice a day  . levothyroxine (SYNTHROID, LEVOTHROID) 150 MCG tablet Take 1 tablet (150 mcg total) by mouth daily.  . methocarbamol (ROBAXIN) 500 MG tablet Take 1 tablet (500 mg total) by mouth every 8 (eight) hours as needed for muscle spasms. Headache, caution of sedation  . Multiple Vitamin (MULTIVITAMIN) tablet Take 1 tablet by mouth daily.  Marland Kitchen. omeprazole (PRILOSEC) 20 MG capsule Take 1 capsule (20 mg total) by mouth daily. (Patient taking differently: Take 20 mg by mouth 2 (two) times daily. )  . PARoxetine (PAXIL) 40 MG tablet Take 1 tablet (40 mg total) by mouth every morning.  . rosuvastatin (CRESTOR) 20 MG tablet Take 1 tablet (20 mg total) by mouth daily.  Marland Kitchen. warfarin (COUMADIN) 7.5 MG tablet Take 1 1/2 tablets daily or AS DIRECTED BY  ANTICOAGULATION CLINIC   Current Facility-Administered Medications for the 01/11/18 encounter (Office Visit) with Pricilla Riffleoss, Filemon Breton V, MD  Medication  . cyanocobalamin ((VITAMIN B-12)) injection 1,000 mcg     Allergies:   Lisinopril; Lovenox [enoxaparin sodium]; Moxifloxacin; and Amoxicillin   Past Medical History:  Diagnosis Date  . Anxiety state, unspecified    panic attacks  . Arthritis    Lupus - hands/knees  . DVT (deep venous thrombosis) (HCC)   . Edema   . 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 GERD (gastroesophageal reflux disease)    on prilosec r/t gastric sleeve surgery  . HLD (hyperlipidemia)   . Iron deficiency anemia    IV dextran Coladonato  . Nephritis and nephropathy, not specified as acute or chronic, with unspecified pathological lesion in kidney   . Nonspecific abnormal results of liver function study   . Obesity, unspecified   . Other pulmonary embolism and infarction   . Other specified acquired hypothyroidism   . Panic disorder without agoraphobia   . Personal history of venous thrombosis and embolism 2002   during pregnancy; ?  factor 5 leiden (sees heme)  . Pleural effusion 2005   c/w lupus initial w/u; recurrent Right as pred tapered off July 2011  . Pure hypercholesterolemia   . Sleep apnea   . 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 Unspecified essential hypertension   . Unspecified pleural effusion     Past Surgical History:  Procedure Laterality Date  . gastric sleeve  8/12   bariatric surgery Dr Adolphus Birchwood   . LAPAROSCOPIC TUBAL LIGATION  12/09/2010   Procedure: LAPAROSCOPIC TUBAL LIGATION;  Surgeon: Juluis Mire;  Location: [facility];  Service: Gynecology;  Laterality: Bilateral;  Attempted see Nursing note  . pleurocentesis  10/2004  . Pleuryx catheter placement  9/11    VanTright----removed 9/11  .  RENAL BIOPSY  09/24/2012  . svd     x 3  . THORACENTESIS  2010   with penumonia  . WISDOM TOOTH EXTRACTION       Social History:  The patient  reports that she quit smoking about 8 years ago. Her smoking use included cigarettes. She has a 5.00 pack-year smoking history. She has never used smokeless tobacco. She reports that she drinks alcohol. She reports that she does not use drugs.   Family History:  [name] family history includes Diabetes in her unknown relative; Hyperlipidemia in her father; Hypertension in her father; Leukemia in her sister; Lupus in her sister.    ROS:  Please see the history of present illness. All other systems are reviewed and  Negative to the above problem except as noted.    PHYSICAL EXAM: VS:  BP 118/68   Pulse 79   Ht 5\' 4"  (1.626 m)   Wt (!) 310 lb (140.6 kg)   SpO2 95%   BMI 53.21 kg/m   GEN:  Morbidly obese 52 yo , in no acute distress  HEENT: normal  Neck: no JVD, carotid bruits, or masses Cardiac: RRR; Gr IIIVI systolic mid peaking systolic  murmur at base, rubs, or gallops,no edema  Respiratory:  clear to auscultation bilaterally, normal work of breathing GI: soft, nontender, nondistended, + BS  No hepatomegaly  MS: no deformity Moving all extremities   Skin: warm and dry, no rash Neuro:  Strength and sensation are intact Psych: euthymic mood, full affect   EKG:  EKG is not ordered     Lipid Panel    Component Value Date/Time   CHOL 182 11/12/2017 1201   TRIG 151.0 (H) 11/12/2017 1201   HDL 43.90 11/12/2017 1201   CHOLHDL 4 11/12/2017 1201   VLDL 30.2 11/12/2017 1201   LDLCALC 108 (H) 11/12/2017 1201   LDLDIRECT 203.7 06/03/2011 1433      Wt Readings from Last 3 Encounters:  01/11/18 (!) 310 lb (140.6 kg)  11/16/17 (!) 305 lb 12 oz (138.7 kg)  09/04/17 (!) 311 lb (141.1 kg)      ASSESSMENT AND PLAN:  1  AS   [place] by echo in 2018   Murmur is not significantly changed  Pt remains asymptomatic   F/U with echo  next year and in clinic   Call if SOB, CP , dizzy   2  Hx DVT/PE  Countinue coumadin    3  HL  LDL 108  On Crestor 20  Watch saturated fats   4  HTN BP is good   Keep on same meds   5   HA  Follow with Dr Neale Burly  Current medicines are reviewed at length with the patient today.  The patient does not have concerns regarding medicines.  Signed, Dietrich Pates, MD  01/11/2018 11:09 AM    [facility] [address] [place], [place], [place]  [address] Phone: [phone]; Fax: [phone]

## 2018-01-11 NOTE — Patient Instructions (Signed)
Medication Instructions:  NO CHANGE If you need a refill on your cardiac medications before your next appointment, please call your pharmacy.   Lab work: NONE If you have labs (blood work) drawn today and your tests are completely normal, you will receive your results only by: Marland Kitchen. MyChart Message (if you have MyChart) OR . A paper copy in the mail If you have any lab test that is abnormal or we need to change your treatment, we will call you to review the results.  Testing/Procedures: Your physician has requested that you have an echocardiogram. Echocardiography is a painless test that uses sound waves to create images of your heart. It provides your doctor with information about the size and shape of your heart and how well your heart's chambers and valves are working. This procedure takes approximately one hour. There are no restrictions for this procedure.   Follow-Up: At [facility], you and your health needs are our priority.  As part of our continuing mission to provide you with exceptional heart care, we have created designated Provider Care Teams.  These Care Teams include your primary Cardiologist (physician) and Advanced Practice Providers (APPs -  Physician Assistants and Nurse Practitioners) who all work together to provide you with the care you need, when you need it. You will need a follow up appointment in:  9 months (OCT).  Please call our office 2 months in advance to schedule this appointment.  You may see Dr. Tenny Crawoss or one of the following Advanced Practice Providers on your designated Care Team: Tereso NewcomerScott Weaver, PA-C Vin Timber LakesBhagat, [place] JerseyPA-C . Berton BonJanine Hammond, NP  Any Other Special Instructions Will Be Listed Below (If Applicable).

## 2018-01-15 ENCOUNTER — Ambulatory Visit: Payer: Managed Care, Other (non HMO)

## 2018-01-19 ENCOUNTER — Ambulatory Visit: Payer: Managed Care, Other (non HMO)

## 2018-01-22 ENCOUNTER — Ambulatory Visit (INDEPENDENT_AMBULATORY_CARE_PROVIDER_SITE_OTHER): Payer: Managed Care, Other (non HMO) | Admitting: General Practice

## 2018-01-22 DIAGNOSIS — E538 Deficiency of other specified B group vitamins: Secondary | ICD-10-CM

## 2018-01-22 DIAGNOSIS — Z86711 Personal history of pulmonary embolism: Secondary | ICD-10-CM

## 2018-01-22 DIAGNOSIS — D6851 Activated protein C resistance: Secondary | ICD-10-CM

## 2018-01-22 DIAGNOSIS — Z7901 Long term (current) use of anticoagulants: Secondary | ICD-10-CM

## 2018-01-22 LAB — POCT INR: INR: 4.2 — AB (ref 2.0–3.0)

## 2018-01-22 MED ORDER — CYANOCOBALAMIN 1000 MCG/ML IJ SOLN
1000.0000 ug | Freq: Once | INTRAMUSCULAR | Status: AC
  Administered 2018-01-22: 1000 ug via INTRAMUSCULAR

## 2018-01-22 NOTE — Patient Instructions (Addendum)
Pre visit review using our clinic review tool, if applicable. No additional management support is needed unless otherwise documented below in the visit note.  Hold coumadin today and then change dosage and take 1 1/2 tablets all days except 1 tablet on Mondays and Fridays.   Re-check in 6  weeks. Pt will need B-12

## 2018-02-19 ENCOUNTER — Ambulatory Visit (INDEPENDENT_AMBULATORY_CARE_PROVIDER_SITE_OTHER): Payer: Managed Care, Other (non HMO) | Admitting: General Practice

## 2018-02-19 DIAGNOSIS — D6851 Activated protein C resistance: Secondary | ICD-10-CM

## 2018-02-19 DIAGNOSIS — E538 Deficiency of other specified B group vitamins: Secondary | ICD-10-CM

## 2018-02-19 DIAGNOSIS — Z86711 Personal history of pulmonary embolism: Secondary | ICD-10-CM

## 2018-02-19 DIAGNOSIS — Z7901 Long term (current) use of anticoagulants: Secondary | ICD-10-CM

## 2018-02-19 LAB — POCT INR: INR: 3 (ref 2.0–3.0)

## 2018-02-19 MED ORDER — CYANOCOBALAMIN 1000 MCG/ML IJ SOLN
1000.0000 ug | Freq: Once | INTRAMUSCULAR | Status: AC
  Administered 2018-02-19: 1000 ug via INTRAMUSCULAR

## 2018-02-19 NOTE — Patient Instructions (Signed)
Pre visit review using our clinic review tool, if applicable. No additional management support is needed unless otherwise documented below in the visit note. 

## 2018-02-23 ENCOUNTER — Other Ambulatory Visit: Payer: Self-pay | Admitting: Family Medicine

## 2018-03-15 ENCOUNTER — Telehealth: Payer: Self-pay | Admitting: Family Medicine

## 2018-03-15 NOTE — Telephone Encounter (Signed)
I left a message on patient's voice mail letting her know the form she faxed here for Dr.Tower to sign is ready for pick up.

## 2018-03-19 ENCOUNTER — Ambulatory Visit (INDEPENDENT_AMBULATORY_CARE_PROVIDER_SITE_OTHER): Payer: Managed Care, Other (non HMO) | Admitting: General Practice

## 2018-03-19 DIAGNOSIS — Z7901 Long term (current) use of anticoagulants: Secondary | ICD-10-CM | POA: Diagnosis not present

## 2018-03-19 DIAGNOSIS — E538 Deficiency of other specified B group vitamins: Secondary | ICD-10-CM

## 2018-03-19 DIAGNOSIS — Z86711 Personal history of pulmonary embolism: Secondary | ICD-10-CM

## 2018-03-19 DIAGNOSIS — D6851 Activated protein C resistance: Secondary | ICD-10-CM

## 2018-03-19 LAB — POCT INR: INR: 1.3 — AB (ref 2.0–3.0)

## 2018-03-19 MED ORDER — CYANOCOBALAMIN 1000 MCG/ML IJ SOLN
1000.0000 ug | Freq: Once | INTRAMUSCULAR | Status: AC
  Administered 2018-03-19: 1000 ug via INTRAMUSCULAR

## 2018-03-19 NOTE — Progress Notes (Signed)
cyan 

## 2018-03-19 NOTE — Patient Instructions (Signed)
Pre visit review using our clinic review tool, if applicable. No additional management support is needed unless otherwise documented below in the visit note.  Take 15 mg (2 tablets) today, tomorrow and Sunday and on Monday continue take 1 1/2 tablets all days except 1 tablet on Mondays and Fridays.   Re-check in 2 weeks. Pt will need B-12

## 2018-03-31 ENCOUNTER — Other Ambulatory Visit: Payer: Self-pay | Admitting: Family Medicine

## 2018-03-31 NOTE — Telephone Encounter (Signed)
Routing to coumadin nurse  

## 2018-04-02 ENCOUNTER — Ambulatory Visit: Payer: Managed Care, Other (non HMO)

## 2018-04-23 ENCOUNTER — Other Ambulatory Visit: Payer: Self-pay

## 2018-04-23 ENCOUNTER — Ambulatory Visit (INDEPENDENT_AMBULATORY_CARE_PROVIDER_SITE_OTHER): Payer: Managed Care, Other (non HMO) | Admitting: General Practice

## 2018-04-23 DIAGNOSIS — E538 Deficiency of other specified B group vitamins: Secondary | ICD-10-CM | POA: Diagnosis not present

## 2018-04-23 DIAGNOSIS — D6851 Activated protein C resistance: Secondary | ICD-10-CM

## 2018-04-23 DIAGNOSIS — Z7901 Long term (current) use of anticoagulants: Secondary | ICD-10-CM | POA: Diagnosis not present

## 2018-04-23 DIAGNOSIS — Z86711 Personal history of pulmonary embolism: Secondary | ICD-10-CM

## 2018-04-23 LAB — POCT INR: INR: 3.1 — AB (ref 2.0–3.0)

## 2018-04-23 MED ORDER — CYANOCOBALAMIN 1000 MCG/ML IJ SOLN
1000.0000 ug | Freq: Once | INTRAMUSCULAR | Status: AC
  Administered 2018-04-23: 1000 ug via INTRAMUSCULAR

## 2018-04-23 NOTE — Patient Instructions (Addendum)
Pre visit review using our clinic review tool, if applicable. No additional management support is needed unless otherwise documented below in the visit note.  Take 1 1/2 tablets all days.   Re-check in 6 weeks. Pt will need B-12

## 2018-06-04 ENCOUNTER — Ambulatory Visit: Payer: Managed Care, Other (non HMO)

## 2018-06-08 ENCOUNTER — Ambulatory Visit (INDEPENDENT_AMBULATORY_CARE_PROVIDER_SITE_OTHER): Payer: Managed Care, Other (non HMO) | Admitting: General Practice

## 2018-06-08 DIAGNOSIS — E538 Deficiency of other specified B group vitamins: Secondary | ICD-10-CM

## 2018-06-08 DIAGNOSIS — D6851 Activated protein C resistance: Secondary | ICD-10-CM

## 2018-06-08 DIAGNOSIS — Z7901 Long term (current) use of anticoagulants: Secondary | ICD-10-CM | POA: Diagnosis not present

## 2018-06-08 DIAGNOSIS — Z86711 Personal history of pulmonary embolism: Secondary | ICD-10-CM

## 2018-06-08 LAB — POCT INR: INR: 2.5 (ref 2.0–3.0)

## 2018-06-08 MED ORDER — CYANOCOBALAMIN 1000 MCG/ML IJ SOLN
1000.0000 ug | Freq: Once | INTRAMUSCULAR | Status: AC
  Administered 2018-06-08: 1000 ug via INTRAMUSCULAR

## 2018-06-08 NOTE — Patient Instructions (Addendum)
Pre visit review using our clinic review tool, if applicable. No additional management support is needed unless otherwise documented below in the visit note.  Take 1 1/2 tablets all days.   Re-check in 6 weeks. Pt will need B-12

## 2018-07-06 ENCOUNTER — Ambulatory Visit (INDEPENDENT_AMBULATORY_CARE_PROVIDER_SITE_OTHER): Payer: Managed Care, Other (non HMO) | Admitting: General Practice

## 2018-07-06 ENCOUNTER — Other Ambulatory Visit: Payer: Self-pay

## 2018-07-06 DIAGNOSIS — E538 Deficiency of other specified B group vitamins: Secondary | ICD-10-CM

## 2018-07-06 DIAGNOSIS — Z7901 Long term (current) use of anticoagulants: Secondary | ICD-10-CM

## 2018-07-06 DIAGNOSIS — Z86711 Personal history of pulmonary embolism: Secondary | ICD-10-CM

## 2018-07-06 DIAGNOSIS — D6851 Activated protein C resistance: Secondary | ICD-10-CM

## 2018-07-06 LAB — POCT INR: INR: 1.2 — AB (ref 2.0–3.0)

## 2018-07-06 MED ORDER — CYANOCOBALAMIN 1000 MCG/ML IJ SOLN
1000.0000 ug | Freq: Once | INTRAMUSCULAR | Status: AC
  Administered 2018-07-06: 1000 ug via INTRAMUSCULAR

## 2018-07-06 NOTE — Patient Instructions (Signed)
Pre visit review using our clinic review tool, if applicable. No additional management support is needed unless otherwise documented below in the visit note. Take 2 tablets Wed, Thur and Friday and then resume 1 1/2 tablets daily.  Re-check in 1 week.

## 2018-07-20 ENCOUNTER — Ambulatory Visit: Payer: Managed Care, Other (non HMO)

## 2018-08-17 ENCOUNTER — Ambulatory Visit: Payer: Managed Care, Other (non HMO)

## 2018-08-23 ENCOUNTER — Other Ambulatory Visit: Payer: Self-pay | Admitting: Family Medicine

## 2018-08-23 NOTE — Telephone Encounter (Signed)
Routing to Coumadin nurse  

## 2018-08-25 ENCOUNTER — Other Ambulatory Visit: Payer: Self-pay | Admitting: General Practice

## 2018-08-25 ENCOUNTER — Telehealth: Payer: Self-pay | Admitting: General Practice

## 2018-08-25 DIAGNOSIS — Z7901 Long term (current) use of anticoagulants: Secondary | ICD-10-CM

## 2018-08-25 MED ORDER — WARFARIN SODIUM 7.5 MG PO TABS: ORAL_TABLET | ORAL | 0 refills | Status: DC

## 2018-08-25 NOTE — Telephone Encounter (Signed)
I call in a 2 week supply of coumadin to [facility] [employer] on [address] in [place].

## 2018-08-25 NOTE — Telephone Encounter (Signed)
I am going to forward this to Tatamy who is more familiar with this patient.  Appears she has been extremely subtherapeutic and did not keep her 1 week follow up back in June following a dosage change.   I am concerned for refill if patient isn't being compliant but will defer to [facility] for guidance and approval of refill.

## 2018-08-25 NOTE — Telephone Encounter (Signed)
lmovm for patient to call Villa Herb, RN and schedule appointment for INR. Patient needs INR before further coumadin re-fills can be given.

## 2018-08-27 ENCOUNTER — Other Ambulatory Visit: Payer: Self-pay

## 2018-08-27 ENCOUNTER — Ambulatory Visit (INDEPENDENT_AMBULATORY_CARE_PROVIDER_SITE_OTHER): Payer: Managed Care, Other (non HMO) | Admitting: General Practice

## 2018-08-27 DIAGNOSIS — Z86711 Personal history of pulmonary embolism: Secondary | ICD-10-CM

## 2018-08-27 DIAGNOSIS — D6851 Activated protein C resistance: Secondary | ICD-10-CM | POA: Diagnosis not present

## 2018-08-27 DIAGNOSIS — E538 Deficiency of other specified B group vitamins: Secondary | ICD-10-CM

## 2018-08-27 DIAGNOSIS — Z7901 Long term (current) use of anticoagulants: Secondary | ICD-10-CM | POA: Diagnosis not present

## 2018-08-27 LAB — POCT INR: INR: 1.6 — AB (ref 2.0–3.0)

## 2018-08-27 MED ORDER — CYANOCOBALAMIN 1000 MCG/ML IJ SOLN
1000.0000 ug | Freq: Once | INTRAMUSCULAR | Status: AC
  Administered 2018-08-27: 1000 ug via INTRAMUSCULAR

## 2018-08-27 NOTE — Patient Instructions (Signed)
Pre visit review using our clinic review tool, if applicable. No additional management support is needed unless otherwise documented below in the visit note.  Take 2 tablets today, tomorrow, Saturday and Sunday.  On Monday continue 1 1/2 tablets and re-check next Friday.

## 2018-09-03 ENCOUNTER — Other Ambulatory Visit: Payer: Self-pay

## 2018-09-03 ENCOUNTER — Ambulatory Visit (INDEPENDENT_AMBULATORY_CARE_PROVIDER_SITE_OTHER): Payer: Managed Care, Other (non HMO) | Admitting: General Practice

## 2018-09-03 DIAGNOSIS — Z7901 Long term (current) use of anticoagulants: Secondary | ICD-10-CM | POA: Diagnosis not present

## 2018-09-03 DIAGNOSIS — Z86711 Personal history of pulmonary embolism: Secondary | ICD-10-CM

## 2018-09-03 DIAGNOSIS — D6851 Activated protein C resistance: Secondary | ICD-10-CM

## 2018-09-03 LAB — POCT INR: INR: 3.4 — AB (ref 2.0–3.0)

## 2018-09-03 NOTE — Patient Instructions (Signed)
Pre visit review using our clinic review tool, if applicable. No additional management support is needed unless otherwise documented below in the visit note.  Change dosage and take 1 1/2 tablets all days except take 2 tablets on Sundays and Thursdays.  Re-check in 3 weeks.

## 2018-09-03 NOTE — Progress Notes (Signed)
Medical screening examination/treatment/procedure(s) were performed by non-physician practitioner and as supervising physician I was immediately available for consultation/collaboration. I agree with above. James John, MD   

## 2018-09-24 ENCOUNTER — Ambulatory Visit: Payer: Managed Care, Other (non HMO)

## 2018-10-05 ENCOUNTER — Ambulatory Visit: Payer: Managed Care, Other (non HMO)

## 2018-10-06 ENCOUNTER — Ambulatory Visit (INDEPENDENT_AMBULATORY_CARE_PROVIDER_SITE_OTHER): Payer: Managed Care, Other (non HMO) | Admitting: General Practice

## 2018-10-06 ENCOUNTER — Other Ambulatory Visit: Payer: Self-pay

## 2018-10-06 DIAGNOSIS — Z7901 Long term (current) use of anticoagulants: Secondary | ICD-10-CM | POA: Diagnosis not present

## 2018-10-06 DIAGNOSIS — D6851 Activated protein C resistance: Secondary | ICD-10-CM

## 2018-10-06 DIAGNOSIS — E538 Deficiency of other specified B group vitamins: Secondary | ICD-10-CM | POA: Diagnosis not present

## 2018-10-06 DIAGNOSIS — Z86711 Personal history of pulmonary embolism: Secondary | ICD-10-CM

## 2018-10-06 LAB — POCT INR: INR: 3 (ref 2.0–3.0)

## 2018-10-06 NOTE — Progress Notes (Signed)
I have reviewed the results and agree with this plan   

## 2018-10-06 NOTE — Patient Instructions (Addendum)
Pre visit review using our clinic review tool, if applicable. No additional management support is needed unless otherwise documented below in the visit note.  Continue to take 1 1/2 tablets all days except take 2 tablets on Sundays and Thursdays.  Re-check in 4 weeks.

## 2018-10-07 ENCOUNTER — Other Ambulatory Visit: Payer: Self-pay | Admitting: Family Medicine

## 2018-10-07 NOTE — Telephone Encounter (Signed)
That is fine I pended px ? What pharmacy she wants

## 2018-10-07 NOTE — Telephone Encounter (Signed)
Pt is wanting to know if you can increase omeprazole to twice a day. She said she is taking more b/c it has been bothering her and she doesn't want to run out by taking more.  CB [phone]

## 2018-10-07 NOTE — Telephone Encounter (Signed)
Left VM requesting pt to call the office back 

## 2018-10-13 MED ORDER — OMEPRAZOLE 20 MG PO CPDR: DELAYED_RELEASE_CAPSULE | ORAL | 3 refills | Status: DC

## 2018-10-19 MED ORDER — OMEPRAZOLE 20 MG PO CPDR: DELAYED_RELEASE_CAPSULE | ORAL | 3 refills | Status: DC

## 2018-10-19 NOTE — Addendum Note (Signed)
Addended by: Tammi Sou on: 10/19/2018 09:58 AM   Modules accepted: Orders

## 2018-11-09 ENCOUNTER — Ambulatory Visit: Payer: Managed Care, Other (non HMO)

## 2018-11-12 ENCOUNTER — Other Ambulatory Visit (HOSPITAL_COMMUNITY): Payer: Managed Care, Other (non HMO)

## 2018-11-22 ENCOUNTER — Ambulatory Visit (HOSPITAL_COMMUNITY): Payer: Managed Care, Other (non HMO) | Attending: Cardiovascular Disease

## 2018-11-22 ENCOUNTER — Other Ambulatory Visit: Payer: Self-pay

## 2018-11-22 DIAGNOSIS — I350 Nonrheumatic aortic (valve) stenosis: Secondary | ICD-10-CM | POA: Insufficient documentation

## 2018-11-23 ENCOUNTER — Ambulatory Visit: Payer: Managed Care, Other (non HMO)

## 2018-11-30 ENCOUNTER — Other Ambulatory Visit: Payer: Self-pay | Admitting: *Deleted

## 2018-11-30 ENCOUNTER — Ambulatory Visit: Payer: Managed Care, Other (non HMO)

## 2018-11-30 MED ORDER — ROSUVASTATIN CALCIUM 20 MG PO TABS: 20.0000 mg | ORAL_TABLET | Freq: Every day | ORAL | 0 refills | Status: DC

## 2018-11-30 MED ORDER — PAROXETINE HCL 40 MG PO TABS: 40.0000 mg | ORAL_TABLET | Freq: Every morning | ORAL | 0 refills | Status: DC

## 2018-11-30 MED ORDER — LABETALOL HCL 200 MG PO TABS: ORAL_TABLET | ORAL | 0 refills | Status: DC

## 2018-11-30 NOTE — Telephone Encounter (Signed)
Please schedule PE and refill until then  

## 2018-11-30 NOTE — Telephone Encounter (Signed)
Last OV was her CPE on 11/16/17, no recent or future appts. Please advise

## 2018-11-30 NOTE — Telephone Encounter (Signed)
Med refilled once and Carrie will reach out to pt to try and get CPE scheduled  

## 2018-12-09 ENCOUNTER — Other Ambulatory Visit: Payer: Self-pay | Admitting: *Deleted

## 2018-12-09 DIAGNOSIS — I350 Nonrheumatic aortic (valve) stenosis: Secondary | ICD-10-CM

## 2018-12-17 ENCOUNTER — Ambulatory Visit (INDEPENDENT_AMBULATORY_CARE_PROVIDER_SITE_OTHER): Payer: Managed Care, Other (non HMO) | Admitting: General Practice

## 2018-12-17 ENCOUNTER — Other Ambulatory Visit: Payer: Self-pay

## 2018-12-17 DIAGNOSIS — E538 Deficiency of other specified B group vitamins: Secondary | ICD-10-CM

## 2018-12-17 DIAGNOSIS — Z7901 Long term (current) use of anticoagulants: Secondary | ICD-10-CM

## 2018-12-17 DIAGNOSIS — D6851 Activated protein C resistance: Secondary | ICD-10-CM

## 2018-12-17 DIAGNOSIS — Z86711 Personal history of pulmonary embolism: Secondary | ICD-10-CM

## 2018-12-17 LAB — POCT INR: INR: 3.7 — AB (ref 2.0–3.0)

## 2018-12-17 MED ORDER — CYANOCOBALAMIN 1000 MCG/ML IJ SOLN
1000.0000 ug | Freq: Once | INTRAMUSCULAR | Status: AC
  Administered 2019-11-17: 1000 ug via INTRAMUSCULAR

## 2018-12-17 NOTE — Progress Notes (Signed)
Medical screening examination/treatment/procedure(s) were performed by non-physician practitioner and as supervising physician I was immediately available for consultation/collaboration. I agree with above. Usbaldo Pannone, MD   

## 2018-12-17 NOTE — Patient Instructions (Addendum)
Pre visit review using our clinic review tool, if applicable. No additional management support is needed unless otherwise documented below in the visit note.  Skip coumadin today and then continue to take 1 1/2 tablets all days except take 2 tablets on Sundays and Thursdays.  Re-check in 4 weeks.

## 2018-12-31 ENCOUNTER — Other Ambulatory Visit: Payer: Self-pay | Admitting: Family Medicine

## 2019-01-04 ENCOUNTER — Encounter: Payer: Self-pay | Admitting: Family Medicine

## 2019-01-04 ENCOUNTER — Ambulatory Visit (INDEPENDENT_AMBULATORY_CARE_PROVIDER_SITE_OTHER): Payer: Managed Care, Other (non HMO) | Admitting: Family Medicine

## 2019-01-04 ENCOUNTER — Other Ambulatory Visit: Payer: Self-pay

## 2019-01-04 VITALS — BP 126/78 | HR 70 | Temp 96.8°F | Ht 64.0 in | Wt 320.2 lb

## 2019-01-04 DIAGNOSIS — I350 Nonrheumatic aortic (valve) stenosis: Secondary | ICD-10-CM | POA: Diagnosis not present

## 2019-01-04 DIAGNOSIS — I10 Essential (primary) hypertension: Secondary | ICD-10-CM

## 2019-01-04 DIAGNOSIS — R7303 Prediabetes: Secondary | ICD-10-CM | POA: Diagnosis not present

## 2019-01-04 DIAGNOSIS — Z0000 Encounter for general adult medical examination without abnormal findings: Secondary | ICD-10-CM | POA: Diagnosis not present

## 2019-01-04 DIAGNOSIS — Z23 Encounter for immunization: Secondary | ICD-10-CM

## 2019-01-04 DIAGNOSIS — E538 Deficiency of other specified B group vitamins: Secondary | ICD-10-CM

## 2019-01-04 DIAGNOSIS — M3214 Glomerular disease in systemic lupus erythematosus: Secondary | ICD-10-CM

## 2019-01-04 DIAGNOSIS — R112 Nausea with vomiting, unspecified: Secondary | ICD-10-CM

## 2019-01-04 DIAGNOSIS — D6851 Activated protein C resistance: Secondary | ICD-10-CM

## 2019-01-04 DIAGNOSIS — E7800 Pure hypercholesterolemia, unspecified: Secondary | ICD-10-CM

## 2019-01-04 DIAGNOSIS — M3213 Lung involvement in systemic lupus erythematosus: Secondary | ICD-10-CM | POA: Insufficient documentation

## 2019-01-04 DIAGNOSIS — N182 Chronic kidney disease, stage 2 (mild): Secondary | ICD-10-CM | POA: Diagnosis not present

## 2019-01-04 DIAGNOSIS — E890 Postprocedural hypothyroidism: Secondary | ICD-10-CM | POA: Diagnosis not present

## 2019-01-04 NOTE — Progress Notes (Signed)
Subjective:    Patient ID: [name], female    DOB: 1965-11-15, 53 y.o.   MRN: [id number]  HPI Here for health maintenance exam and to review chronic medical problems   Has had ups and downs in the past year  Staying safe during covid   Wt Readings from Last 3 Encounters:  01/04/19 (!) 320 lb 3 oz (145.2 kg)  01/11/18 (!) 310 lb (140.6 kg)  11/16/17 (!) 305 lb 12 oz (138.7 kg)  frustrated by her weight  Has had weight loss surgery (still has a lot of vomiting)-never did go to another surgery  Needs to get back to weight loss  54.96 kg/m   Exercise -she is getting out to walk /doing her yard work  Hospital doctor not to sit too much  Working from home since march   Zoster status -not interested in vaccine yet    Flu shot given today   Mammogram jan 2020   (scheduled to have one in early January)- [facility] We do not have report  Sees Dr [name] every [name]  Self breast exam  Colon cancer screening -had colonoscopy last year    Pap jan 2020  Gyn hx-had endometrial ablation    Tdap 1/16  bp is stable today  No cp or palpitations or headaches or edema  No side effects to medicines  BP Readings from Last 3 Encounters:  01/04/19 126/78  01/11/18 118/68  11/16/17 124/78     H/o aortic stenosis  Also OSA  Hypothyroid-thyroid ablation in the past  Lab Results  Component Value Date   TSH 0.23 (L) 11/12/2017   due for labs - lost to f/u We changed dose at that time  Feeling tired recently   SLE with nephritis and CKD Has a painful toe (middle left foot) since the spring  Also L knee  Rheumatology is [name] rheumatology)  Taking plaquenil   Factor V leiden-takes coumadin  Lab Results  Component Value Date   INR 3.7 (A) 12/17/2018   INR 3.0 10/06/2018   INR 3.4 (A) 09/03/2018   PROTIME 15.6 (H) 12/06/2012   PROTIME 19.2 (H) 06/03/2012  has had DVT and PE in the past   Prediabetes Lab Results  Component Value Date   HGBA1C 5.5 11/12/2017   Vit B12 def  Taking orally and getting shots every 4-6 wk when she gets INR  Patient Active Problem List   Diagnosis Date Noted  . 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 Aortic stenosis 08/15/2017  . Vitamin B12 deficiency 11/15/2015  . Nausea & vomiting 10/15/2015  . Allergic rhinitis 07/01/2015  . History of cerebral hemorrhage 07/01/2015  . Chronic daily headache 06/29/2015  . Screening for osteoporosis 11/04/2013  . Encounter for therapeutic drug monitoring 03/25/2013  . CKD (chronic kidney disease) stage 2, GFR 60-89 ml/min 12/20/2012  . 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 Routine general medical examination at a health care facility 12/07/2012  . Steroid long-term use 12/07/2012  . History of HPV infection 12/07/2012  . Long term (current) use of anticoagulants 10/06/2012  . Chronic anticoagulation 04/22/2012  . S/P bariatric surgery 04/22/2012  . Factor V Leiden (HCC) 04/22/2012  . OSA on CPAP 11/20/2011  . Prediabetes 09/16/2010  . EDEMA 01/08/2010  . PLEURAL EFFUSION 08/27/2009  . ENLARGEMENT OF LYMPH NODES 08/13/2009  . History of pulmonary embolism 05/24/2009  . Morbid obesity (HCC) 12/08/2007  . Hypothyroidism 06/09/2006  .  HYPERCHOLESTEROLEMIA 06/09/2006  . Generalized anxiety disorder 06/09/2006  . PANIC DISORDER 06/09/2006  . Essential hypertension 06/09/2006  . GLOMERULONEPHRITIS 06/09/2006  . Systemic lupus erythematosus ([place]) 06/09/2006  . DVT, HX OF 06/09/2006   Past Medical History:  Diagnosis Date  . Anxiety state, unspecified    panic attacks  . Arthritis    Lupus - hands/knees  . DVT (deep venous thrombosis) ([place])   . Edema   . 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 GERD (gastroesophageal reflux disease)    on prilosec r/t gastric sleeve surgery  . HLD (hyperlipidemia)   . Iron deficiency anemia     IV dextran [place]  . Nephritis and nephropathy, not specified as acute or chronic, with unspecified pathological lesion in kidney   . Nonspecific abnormal results of liver function study   . Obesity, unspecified   . Other pulmonary embolism and infarction   . Other specified acquired hypothyroidism   . Panic disorder without agoraphobia   . Personal history of venous thrombosis and embolism 2002   during pregnancy; ?factor 5 leiden (sees heme)  . Pleural effusion 2005   c/w lupus initial w/u; recurrent Right as pred tapered off July 2011  . Pure hypercholesterolemia   . Sleep apnea   . 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 Unspecified essential hypertension   . Unspecified pleural effusion    Past Surgical History:  Procedure Laterality Date  . gastric sleeve  8/12   bariatric surgery Dr Evorn Gong   . LAPAROSCOPIC TUBAL LIGATION  12/09/2010   Procedure: LAPAROSCOPIC TUBAL LIGATION;  Surgeon: Darlyn Chamber;  Location: [facility];  Service: Gynecology;  Laterality: Bilateral;  Attempted see Nursing note  . pleurocentesis  10/2004  . Pleuryx catheter placement  9/11    VanTright----removed 9/11  . RENAL BIOPSY  09/24/2012  . svd     x 3  . THORACENTESIS  2010   with penumonia  . WISDOM TOOTH EXTRACTION     Social History   Tobacco Use  . Smoking status: Former Smoker    Packs/day: 0.50    Years: 10.00    Pack years: 5.00    Types: Cigarettes    Quit date: 05/04/2009    Years since quitting: 9.6  . Smokeless tobacco: Never Used  . Tobacco comment: Socially x10 years (1 pack/month)  Substance Use Topics  . Alcohol use: Yes    Alcohol/week: 0.0 standard drinks    Comment: rare  . Drug use: No   Family History  Problem Relation Age of Onset  . Lupus Sister   . Hypertension Father   . Hyperlipidemia Father   . Leukemia Sister   . Diabetes Unknown        GM   Allergies  Allergen Reactions  . Lisinopril Cough  . Lovenox  [Enoxaparin Sodium] Itching  . Moxifloxacin Other (See Comments)    REACTION: hallucinations  . Amoxicillin Other (See Comments)    unknown   Current Outpatient Medications on File Prior to Visit  Medication Sig Dispense Refill  . BIOTIN PO Take 1 capsule by mouth daily.    . cyanocobalamin (,VITAMIN B-12,) 1000 MCG/ML injection Inject 1,000 mcg into the muscle. Every 60 days    . Cyanocobalamin (VITAMIN B-12 PO) Take 1 capsule by mouth daily.    Marland Kitchen  hydroxychloroquine (PLAQUENIL) 200 MG tablet Take 200 mg by mouth 2 (two) times daily.    . IRON PO Take 1 tablet by mouth daily.    Marland Kitchen. labetalol (NORMODYNE) 200 MG tablet TAKE 1 AND 1/2 TABLETS BY MOUTH TWICE A DAY. *NEEDS PHYSICAL APPOINTMENT* 90 tablet 0  . levothyroxine (SYNTHROID, LEVOTHROID) 150 MCG tablet Take 1 tablet (150 mcg total) by mouth daily. 90 tablet 3  . methocarbamol (ROBAXIN) 500 MG tablet Take 1 tablet (500 mg total) by mouth every 8 (eight) hours as needed for muscle spasms. Headache, caution of sedation 60 tablet 1  . Multiple Vitamin (MULTIVITAMIN) tablet Take 1 tablet by mouth daily.    Marland Kitchen. omeprazole (PRILOSEC) 20 MG capsule TAKE 1 CAPSULE BY MOUTH TWICE DAILY 180 capsule 3  . PARoxetine (PAXIL) 40 MG tablet TAKE 1 TABLET (40 MG TOTAL) BY MOUTH EVERY MORNING. *NEEDS A PHYSICAL APPT.* 30 tablet 0  . rosuvastatin (CRESTOR) 20 MG tablet TAKE 1 TABLET (20 MG TOTAL) BY MOUTH DAILY. *NEEDS A PHYSICAL APPT.* 30 tablet 0  . warfarin (COUMADIN) 7.5 MG tablet TAKE 1 & 1/2 TABLETS DAILY or as directed by anticoagulation clinic 25 tablet 0   Current Facility-Administered Medications on File Prior to Visit  Medication Dose Route Frequency Provider Last Rate Last Dose  . cyanocobalamin ((VITAMIN B-12)) injection 1,000 mcg  1,000 mcg Intramuscular Q30 days Tower, Idamae SchullerMarne A, MD   1,000 mcg at 12/17/18 1616  . cyanocobalamin ((VITAMIN B-12)) injection 1,000 mcg  1,000 mcg Intramuscular Once Corwin LevinsJohn, James W, MD         Review of Systems   Constitutional: Positive for fatigue and unexpected weight change. Negative for activity change, appetite change and fever.  HENT: Negative for congestion, ear pain, rhinorrhea, sinus pressure and sore throat.   Eyes: Negative for pain, redness and visual disturbance.  Respiratory: Negative for cough, shortness of breath and wheezing.   Cardiovascular: Negative for chest pain and palpitations.  Gastrointestinal: Positive for nausea and vomiting. Negative for abdominal pain, anal bleeding, blood in stool, constipation and diarrhea.  Endocrine: Negative for polydipsia and polyuria.  Genitourinary: Negative for dysuria, frequency and urgency.  Musculoskeletal: Negative for arthralgias, back pain and myalgias.  Skin: Negative for pallor and rash.  Allergic/Immunologic: Negative for environmental allergies.  Neurological: Negative for dizziness, syncope and headaches.  Hematological: Negative for adenopathy. Does not bruise/bleed easily.  Psychiatric/Behavioral: Negative for decreased concentration and dysphoric mood. The patient is not nervous/anxious.        Objective:   Physical Exam Constitutional:      General: She is not in acute distress.    Appearance: Normal appearance. She is well-developed. She is obese. She is not ill-appearing or diaphoretic.  HENT:     Head: Normocephalic and atraumatic.     Right Ear: Tympanic membrane, ear canal and external ear normal.     Left Ear: Tympanic membrane, ear canal and external ear normal.     Nose: Nose normal. No congestion.     Mouth/Throat:     Mouth: Mucous membranes are moist.     Pharynx: Oropharynx is clear. No posterior oropharyngeal erythema.  Eyes:     General: No scleral icterus.    Extraocular Movements: Extraocular movements intact.     Conjunctiva/sclera: Conjunctivae normal.     Pupils: Pupils are equal, round, and reactive to light.  Neck:     Musculoskeletal: Normal range of motion and neck supple. No neck rigidity or  muscular tenderness.  Thyroid: No thyromegaly.     Vascular: No carotid bruit or JVD.  Cardiovascular:     Rate and Rhythm: Normal rate and regular rhythm.     Pulses: Normal pulses.     Heart sounds: Normal heart sounds. No gallop.   Pulmonary:     Effort: Pulmonary effort is normal. No respiratory distress.     Breath sounds: Normal breath sounds. No wheezing.     Comments: Good air exch Chest:     Chest wall: No tenderness.  Abdominal:     General: Bowel sounds are normal. There is no distension or abdominal bruit.     Palpations: Abdomen is soft. There is no mass.     Tenderness: There is no abdominal tenderness.     Hernia: No hernia is present.  Genitourinary:    Comments: Sees gyn for breast and pelvic exam    Musculoskeletal: Normal range of motion.        General: No tenderness.     Right lower leg: Edema present.     Left lower leg: Edema present.     Comments: Baseline edema with hyperpigmentation of anterior lower legs   L 3rd toe is mildly erythematous/swollen but not tender  Lymphadenopathy:     Cervical: No cervical adenopathy.  Skin:    General: Skin is warm and dry.     Coloration: Skin is not pale.     Findings: No erythema or rash.     Comments: Fair Some tags and sks   Baseline hyperpigmentation of lower legs   Neurological:     Mental Status: She is alert. Mental status is at baseline.     Cranial Nerves: No cranial nerve deficit.     Sensory: No sensory deficit.     Motor: No abnormal muscle tone.     Coordination: Coordination normal.     Gait: Gait normal.     Deep Tendon Reflexes: Reflexes are normal and symmetric. Reflexes normal.  Psychiatric:        Mood and Affect: Mood normal.        Cognition and Memory: Cognition and memory normal.           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)    No recent breathing problems Also has renal complications  L middle toe is inflamed-plans on f/u with rheumatology for this Continues plaquenil        Digestive   Nausea & vomiting    Pt has experienced this on and off/worse lately since her gastric sleeve procedure  Her surgeon office has since closed  Limited to bland foods and gaining weight  Ref to GI made      Relevant Orders   Ambulatory referral to Gastroenterology     Endocrine   Hypothyroidism    Hypothyroidism  Pt has no clinical changes No change  in energy level/ hair or skin/ edema and no tremor TSH today (did change dose last time and then lost to f/u)      Relevant Orders   TSH     Genitourinary   CKD (chronic kidney disease) stage 2, GFR 60-89 ml/min    From SLE Enc to inc fluid intake and continue specialist visits      Relevant Orders   Comprehensive metabolic panel   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HCC)    Per pt -no changes  Continues plaquenil and rheum /renal f/u        Hematopoietic and Hemostatic   Factor V Leiden (HCC)    Continues coumadin  No blood clots in the past year         Other   HYPERCHOLESTEROLEMIA    Due for labs  Disc goals for lipids and reasons to control them Rev last labs with pt Rev low sat fat diet in detail Taking crestor and watches diet      Morbid obesity (HCC)    Discussed how this problem influences overall health and the risks it imposes  Reviewed plan for weight loss with lower calorie diet (via better food choices and also portion control or program like weight watchers) and exercise  building up to or more than 30 minutes 5 days per week including some aerobic activity   Unfortunately has gained wt back since bariatric surgery / has many eating restrictions      Prediabetes    A1C today Wt gain noted disc imp of low glycemic diet and wt loss to prevent DM2       Relevant Orders   Hemoglobin A1c   Routine general medical examination at a health care facility - Primary    Reviewed health habits including diet and exercise and skin cancer prevention Reviewed appropriate screening tests for age  Also reviewed health mt list, fam hx and immunization status , as well as social and family history   See HPI Labs ordered  Flu vaccine given  GI ref made Sent to gyn for her mammo/pap reports  Sent to [facility] for colonoscopy report (per pt last year)         Vitamin B12 deficiency    Shots and oral supplementation  Has had gastric sleeve surgery  Level today      Relevant Orders   Vitamin B12    Other Visit Diagnoses    Need for influenza vaccination       Relevant Orders   Flu Vaccine QUAD 6+ mos PF IM (Fluarix Quad PF) (Completed)

## 2019-01-04 NOTE — Assessment & Plan Note (Signed)
Discussed how this problem influences overall health and the risks it imposes  Reviewed plan for weight loss with lower calorie diet (via better food choices and also portion control or program like weight watchers) and exercise building up to or more than 30 minutes 5 days per week including some aerobic activity   Unfortunately has gained wt back since bariatric surgery / has many eating restrictions

## 2019-01-04 NOTE — Assessment & Plan Note (Signed)
Due for labs  Disc goals for lipids and reasons to control them Rev last labs with pt Rev low sat fat diet in detail Taking crestor and watches diet

## 2019-01-04 NOTE — Assessment & Plan Note (Signed)
bp in fair control at this time  BP Readings from Last 1 Encounters:  01/04/19 126/78   No changes needed Most recent labs reviewed  Disc lifstyle change with low sodium diet and exercise

## 2019-01-04 NOTE — Assessment & Plan Note (Signed)
Continues coumadin  No blood clots in the past year

## 2019-01-04 NOTE — Assessment & Plan Note (Signed)
No recent breathing problems Also has renal complications  L middle toe is inflamed-plans on f/u with rheumatology for this Continues plaquenil

## 2019-01-04 NOTE — Assessment & Plan Note (Signed)
Per pt -no changes  Continues plaquenil and rheum /renal f/u

## 2019-01-04 NOTE — Patient Instructions (Addendum)
Labs today  Flu shot today    I will refer you to GI for your nausea/ food issues   Stop up front to sign a release so we can get pap/ mammo/ colonoscopy reports   Keep working on healthy exercise and self care

## 2019-01-04 NOTE — Assessment & Plan Note (Signed)
Reviewed health habits including diet and exercise and skin cancer prevention Reviewed appropriate screening tests for age  Also reviewed health mt list, fam hx and immunization status , as well as social and family history   See HPI Labs ordered  Flu vaccine given  GI ref made Sent to gyn for her mammo/pap reports  Sent to [facility] for colonoscopy report (per pt last year)

## 2019-01-04 NOTE — Assessment & Plan Note (Signed)
From SLE Enc to inc fluid intake and continue specialist visits

## 2019-01-04 NOTE — Assessment & Plan Note (Signed)
Pt has experienced this on and off/worse lately since her gastric sleeve procedure  Her surgeon office has since closed  Limited to bland foods and gaining weight  Ref to GI made

## 2019-01-04 NOTE — Assessment & Plan Note (Signed)
[facility] cardiology care Not symptomatic

## 2019-01-04 NOTE — Assessment & Plan Note (Signed)
Shots and oral supplementation  Has had gastric sleeve surgery  Level today

## 2019-01-04 NOTE — Assessment & Plan Note (Signed)
Hypothyroidism  Pt has no clinical changes No change in energy level/ hair or skin/ edema and no tremor TSH today (did change dose last time and then lost to f/u)

## 2019-01-04 NOTE — Assessment & Plan Note (Signed)
A1C today Wt gain noted disc imp of low glycemic diet and wt loss to prevent DM2

## 2019-01-05 LAB — LIPID PANEL
Cholesterol: 206 mg/dL — ABNORMAL HIGH (ref 0–200)
HDL: 49.5 mg/dL (ref >39.00)
LDL Cholesterol: 131 mg/dL — ABNORMAL HIGH (ref 0–99)
NonHDL: 156.1
Total CHOL/HDL Ratio: 4
Triglycerides: 124 mg/dL (ref 0.0–149.0)
VLDL: 24.8 mg/dL (ref 0.0–40.0)

## 2019-01-05 LAB — COMPREHENSIVE METABOLIC PANEL
ALT: 21 U/L (ref 0–35)
AST: 36 U/L (ref 0–37)
Albumin: 4 g/dL (ref 3.5–5.2)
Alkaline Phosphatase: 81 U/L (ref 39–117)
BUN: 14 mg/dL (ref 6–23)
CO2: 30 mEq/L (ref 19–32)
Calcium: 9.2 mg/dL (ref 8.4–10.5)
Chloride: 103 mEq/L (ref 96–112)
Creatinine, Ser: 1.12 mg/dL (ref 0.40–1.20)
GFR: 51 mL/min — ABNORMAL LOW (ref >60.00)
Glucose, Bld: 73 mg/dL (ref 70–99)
Potassium: 4.4 mEq/L (ref 3.5–5.1)
Sodium: 140 mEq/L (ref 135–145)
Total Bilirubin: 0.3 mg/dL (ref 0.2–1.2)
Total Protein: 6.9 g/dL (ref 6.0–8.3)

## 2019-01-05 LAB — CBC WITH DIFFERENTIAL/PLATELET
Basophils Absolute: 0 10*3/uL (ref 0.0–0.1)
Basophils Relative: 0.9 % (ref 0.0–3.0)
Eosinophils Absolute: 0.3 10*3/uL (ref 0.0–0.7)
Eosinophils Relative: 6.1 % — ABNORMAL HIGH (ref 0.0–5.0)
HCT: 36.2 % (ref 36.0–46.0)
Hemoglobin: 11.4 g/dL — ABNORMAL LOW (ref 12.0–15.0)
Lymphocytes Relative: 30.6 % (ref 12.0–46.0)
Lymphs Abs: 1.5 10*3/uL (ref 0.7–4.0)
MCHC: 31.4 g/dL (ref 30.0–36.0)
MCV: 81.5 fl (ref 78.0–100.0)
Monocytes Absolute: 0.4 10*3/uL (ref 0.1–1.0)
Monocytes Relative: 7.7 % (ref 3.0–12.0)
Neutro Abs: 2.7 10*3/uL (ref 1.4–7.7)
Neutrophils Relative %: 54.7 % (ref 43.0–77.0)
Platelets: 244 10*3/uL (ref 150.0–400.0)
RBC: 4.45 Mil/uL (ref 3.87–5.11)
RDW: 17.1 % — ABNORMAL HIGH (ref 11.5–15.5)
WBC: 4.8 10*3/uL (ref 4.0–10.5)

## 2019-01-05 LAB — TSH: TSH: 10.03 u[IU]/mL — ABNORMAL HIGH (ref 0.35–4.50)

## 2019-01-05 LAB — VITAMIN B12: Vitamin B-12: 705 pg/mL (ref 211–911)

## 2019-01-05 LAB — HEMOGLOBIN A1C: Hgb A1c MFr Bld: 5.7 % (ref 4.6–6.5)

## 2019-01-06 ENCOUNTER — Other Ambulatory Visit: Payer: Self-pay | Admitting: Family Medicine

## 2019-01-06 ENCOUNTER — Telehealth: Payer: Self-pay | Admitting: *Deleted

## 2019-01-06 NOTE — Telephone Encounter (Signed)
-----   Message from [name], [place] sent at 01/06/2019 11:07 AM EST ----- Pt had not viewed results on mychart. I did read Dr. Marliss Coots comments on her mychart regarding labs. Pt did want me to let Dr. Glori Bickers know she was not fasting for labs so that's why cholesterol might have been high. Pt said that she hasn't missed any doses of her thyroid med and she takes it 1st thing in am before any other meds. [facility] on file

## 2019-01-06 NOTE — Telephone Encounter (Signed)
She currently takes levothyroxine 150 mcg daily   I want to add another 25 mcg for a total of 175 mcg daily   (they do not make a pill that strength )   I pended it to send to pharmacy of choice Please re check TSH in 6 wk -please make that lab appt

## 2019-01-06 NOTE — Telephone Encounter (Signed)
Addressed through result notes  

## 2019-01-06 NOTE — Telephone Encounter (Signed)
Left VM requesting pt to call the office regarding lab results 

## 2019-01-07 ENCOUNTER — Other Ambulatory Visit: Payer: Self-pay

## 2019-01-07 ENCOUNTER — Telehealth: Payer: Self-pay | Admitting: Gastroenterology

## 2019-01-07 ENCOUNTER — Ambulatory Visit (INDEPENDENT_AMBULATORY_CARE_PROVIDER_SITE_OTHER): Payer: Managed Care, Other (non HMO) | Admitting: Gastroenterology

## 2019-01-07 ENCOUNTER — Encounter: Payer: Self-pay | Admitting: Gastroenterology

## 2019-01-07 VITALS — BP 132/74 | HR 85 | Temp 98.3°F | Ht 64.0 in | Wt 322.0 lb

## 2019-01-07 DIAGNOSIS — Z9884 Bariatric surgery status: Secondary | ICD-10-CM | POA: Diagnosis not present

## 2019-01-07 DIAGNOSIS — R112 Nausea with vomiting, unspecified: Secondary | ICD-10-CM | POA: Diagnosis not present

## 2019-01-07 MED ORDER — OMEPRAZOLE 40 MG PO CPDR: 40.0000 mg | DELAYED_RELEASE_CAPSULE | Freq: Two times a day (BID) | ORAL | 3 refills | Status: DC

## 2019-01-07 MED ORDER — LEVOTHYROXINE SODIUM 25 MCG PO TABS: 25.0000 ug | ORAL_TABLET | Freq: Every day | ORAL | 3 refills | Status: DC

## 2019-01-07 NOTE — Progress Notes (Signed)
Referring Provider: Judy Pimple, MD Primary Care Physician:  Tower, Audrie Gallus, MD  Reason for Consultation:  "I want to check things out"   IMPRESSION: Nausea and vomiting since gastric sleeve, worse over the last 3 years History of gastric sleeve    - performed at [facility] Iron deficiency requiring IV iron infusions BMI 55.27 TSH 10.03 01/04/19 Colonoscopy at age 53 at [facility] No known family history of colon cancer or polyps  Nausea and vomiting may be due to stenosis of the gastric sleeve, hiatal hernia or GERD. Reflux may be exacerbated by recent weight gain. Will attempt aggressive antireflux medical therapy. EGD and UGI series to evaluate for anatomic abnormalities recommended. Anastomosis. If no response to medical therapy and anatomy is normal, may consider conversion to RYGB.    Will need to hold warfarin before endoscopy.  I discussed with the patient that there is a low, but real, risk of a cardiovascular event such as heart attack, stroke, or embolism/thrombosis while off Plavix. Will communicate by phone or EMR with patient's prescribing provider to confirm that holding the warfarin is appropriate at this time.   I recommended the patient resume daily supplementation to prevent post bariatric surgery nutritional deficiencies.  A single multivitamin may be used as long as it includes the following: Iron 45 mg daily, vitamin B12 500 mcg daily, folate 800 mcg daily, vitamin D 3000 international units daily, vitamin A 5000 international units daily, him in ED 15 mg daily, vitamin K 300 mcg daily, zinc 8 mg daily, copper 1 mg daily, selenium, magnesium and trace minerals that are contained in a multivitamin such as molybdenum, manganese, and chromium.  A B complex vitamin of at least 50 mg should be used in addition to a multivitamin.   PLAN: Omperazole 40 mg BID UGI series EGD at the hospital after a warfarin washout Colonoscopy report from [facility] Obtain  records from [facility]  Please see the "Patient Instructions" section for addition details about the plan.  HPI: [name] is a 53 y.o. female referred by Dr. Milinda Antis for nausea and vomiting. The history is obtained through the patient and review of her electronic health record.  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 She has a history of DVT due to Factor V Leiden and is on warfarin through the [facility].   Gastric sleeve performed at [facility] around 2012. She does not remember the surgeon's name. Had immediate post-op care but has not followed up with a surgeon or bariatric clinic since that time.  She takes a a vitabrand general multivitamin daily, b12 injection monthly, uses iron supplements. Has required iron infusions on a few occassions since her surgery.    Reports intermittent post-prandial nausea and vomiting since her gastric sleeve. Worse over the last 3 years such that she has needed to increase her medication doses.  Symptoms start 10-15 minutes after eating. Often relieved after vomiting undigested food. Bloating that is not related to eating   Keeping a food journal but has not identified specific triggers.  Intermittently thought rice and some lettuces might be triggers.  Warm drinks can provide some relief.  No dysphagia, odynophagia.  She uses omeprazole OTC daily  and Tums PRN. Will rarely need PeptoBismal for additional relief. Unintentional weight gain.    Concerned that she may have damaged or stretched her stomach.  Feels her symptoms may be related to her sleeve..   Uses tylenol for tooth pain.  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   No recent abdominal imaging. No prior upper endoscopy.  Had a colonoscopy at age 80 with [facility].   Maternal grandmother with colon polyps. No other known family history of colon cancer or polyps. No family history of uterine/endometrial cancer, pancreatic cancer or gastric/stomach cancer.  Labs 01/04/19: Normal CMP, B12 705, hgb 11.4, MCV 81.5, RDW 17.1, platelets 244, TSH 10.03, HgbA1c 5.7   Past Medical History:  Diagnosis Date   Anxiety state, unspecified    panic attacks   Arthritis    Lupus - hands/knees   DVT (deep venous thrombosis) (HCC)    Edema    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    Loculated-chronic on Left-VanTright; s/p VATS 5/10   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   GERD (gastroesophageal reflux disease)    on prilosec r/t gastric sleeve surgery   HLD (hyperlipidemia)    Iron deficiency anemia    IV dextran [place]   Nephritis and nephropathy, not specified as acute or chronic, with unspecified pathological lesion in kidney    Nonspecific abnormal results of liver function study    Obesity, unspecified    Other pulmonary embolism and infarction    Other specified acquired hypothyroidism    Panic disorder without agoraphobia    Personal history of venous thrombosis and embolism 2002   during pregnancy; ?factor 5 leiden (sees heme)   Pleural effusion 2005   c/w lupus initial w/u; recurrent Right as pred tapered off July 2011   Pure hypercholesterolemia    Sleep apnea    Systemic lupus erythematosus (HCC)    renal GN, hx of pericardial eff in late 90's   Unspecified essential hypertension    Unspecified pleural effusion     Past Surgical History:  Procedure Laterality Date   gastric sleeve  8/12   bariatric surgery Dr Adolphus Birchwood    LAPAROSCOPIC TUBAL LIGATION  12/09/2010   Procedure: LAPAROSCOPIC TUBAL LIGATION;  Surgeon: Juluis Mire;  Location: [facility];  Service: Gynecology;   Laterality: Bilateral;  Attempted see Nursing note   pleurocentesis  10/2004   Pleuryx catheter placement  9/11    VanTright----removed 9/11   RENAL BIOPSY  09/24/2012   svd     x 3   THORACENTESIS  2010   with penumonia   WISDOM TOOTH EXTRACTION      Current Outpatient Medications  Medication Sig Dispense Refill   BIOTIN PO Take 1 capsule by mouth daily.     cyanocobalamin (,VITAMIN B-12,) 1000 MCG/ML injection Inject 1,000 mcg into the muscle. Every 60 days     Cyanocobalamin (VITAMIN B-12 PO) Take 1 capsule by mouth daily.     hydroxychloroquine (PLAQUENIL) 200 MG tablet Take 200 mg by mouth 2 (two) times daily.     IRON PO Take 1 tablet by mouth daily.     labetalol (NORMODYNE) 200 MG tablet TAKE 1 AND 1/2 TABLETS BY MOUTH TWICE A DAY. *NEEDS PHYSICAL APPOINTMENT* 90 tablet 0   levothyroxine (SYNTHROID) 25 MCG tablet Take 1 tablet (25 mcg total) by mouth daily before breakfast. In addition to the 150 mcg pill 30 tablet 3   levothyroxine (SYNTHROID, LEVOTHROID) 150  MCG tablet Take 1 tablet (150 mcg total) by mouth daily. 90 tablet 3   methocarbamol (ROBAXIN) 500 MG tablet Take 1 tablet (500 mg total) by mouth every 8 (eight) hours as needed for muscle spasms. Headache, caution of sedation 60 tablet 1   Multiple Vitamin (MULTIVITAMIN) tablet Take 1 tablet by mouth daily.     omeprazole (PRILOSEC) 20 MG capsule TAKE 1 CAPSULE BY MOUTH TWICE DAILY 180 capsule 3   PARoxetine (PAXIL) 40 MG tablet TAKE 1 TABLET (40 MG TOTAL) BY MOUTH EVERY MORNING. *NEEDS A PHYSICAL APPT.* 30 tablet 0   rosuvastatin (CRESTOR) 20 MG tablet TAKE 1 TABLET (20 MG TOTAL) BY MOUTH DAILY. *NEEDS A PHYSICAL APPT.* 30 tablet 0   warfarin (COUMADIN) 7.5 MG tablet TAKE 1 & 1/2 TABLETS DAILY or as directed by anticoagulation clinic 25 tablet 0   Current Facility-Administered Medications  Medication Dose Route Frequency Provider Last Rate Last Dose   cyanocobalamin ((VITAMIN B-12)) injection 1,000  mcg  1,000 mcg Intramuscular Q30 days Tower, Idamae SchullerMarne A, MD   1,000 mcg at 12/17/18 1616   cyanocobalamin ((VITAMIN B-12)) injection 1,000 mcg  1,000 mcg Intramuscular Once Corwin LevinsJohn, James W, MD        Allergies as of 01/07/2019 - Review Complete 01/04/2019  Allergen Reaction Noted   Lisinopril Cough 12/07/2012   Lovenox [enoxaparin sodium] Itching 12/06/2012   Moxifloxacin Other (See Comments) 04/17/2008   Amoxicillin Other (See Comments) 06/09/2006    Family History  Problem Relation Age of Onset   Lupus Sister    Hypertension Father    Hyperlipidemia Father    Leukemia Sister    Diabetes Unknown        GM    Social History   Socioeconomic History   Marital status: Divorced    Spouse name: Not on file   Number of children: 3   Years of education: Not on file   Highest education level: Not on file  Occupational History   Occupation: Psychologist, educationalTrainer at [employer]  Social Needs   Financial resource strain: Not on file   Food insecurity    Worry: Not on file    Inability: Not on file   Transportation needs    Medical: Not on file    Non-medical: Not on file  Tobacco Use   Smoking status: Former Smoker    Packs/day: 0.50    Years: 10.00    Pack years: 5.00    Types: Cigarettes    Quit date: 05/04/2009    Years since quitting: 9.6   Smokeless tobacco: Never Used   Tobacco comment: Socially x10 years (1 pack/month)  Substance and Sexual Activity   Alcohol use: Yes    Alcohol/week: 0.0 standard drinks    Comment: rare   Drug use: No   Sexual activity: Not on file  Lifestyle   Physical activity    Days per week: Not on file    Minutes per session: Not on file   Stress: Not on file  Relationships   Social connections    Talks on phone: Not on file    Gets together: Not on file    Attends religious service: Not on file    Active member of club or organization: Not on file    Attends meetings of clubs or organizations: Not on file     Relationship status: Not on file   Intimate partner violence    Fear of current or ex partner: Not on file    Emotionally  abused: Not on file    Physically abused: Not on file    Forced sexual activity: Not on file  Other Topics Concern   Not on file  Social History Narrative   Not on file    Review of Systems: 12 system ROS is negative except as noted above with the additions of allergies, anxiety, back pain, blood in the urine, vision changes, fatigue, headaches, muscle cramps, nosebleeds, insomnia, sore throat, edema, and urine leakage.   Physical Exam: General:   Alert,  well-nourished, pleasant and cooperative in NAD Head:  Normocephalic and atraumatic. Eyes:  Sclera clear, no icterus.   Conjunctiva pink. Ears:  Normal auditory acuity. Nose:  No deformity, discharge,  or lesions. Mouth:  No deformity or lesions.   Neck:  Supple; no masses or thyromegaly. Lungs:  Clear throughout to auscultation.   No wheezes. Heart:  Regular rate and rhythm; no murmurs. Abdomen:  Soft, obese, nontender, nondistended, normal bowel sounds, no rebound or guarding. Well-healed laproscopic scars. No hepatosplenomegaly.   Rectal:  Deferred  Msk:  Symmetrical. No boney deformities LAD: No inguinal or umbilical LAD Extremities:  No clubbing or edema. Neurologic:  Alert and  oriented x4;  grossly nonfocal Skin:  Intact without significant lesions or rashes. Psych:  Alert and cooperative. Normal mood and affect.   Lab Results: Recent Labs    01/04/19 1615  WBC 4.8  HGB 11.4*  HCT 36.2  PLT 244.0   BMET Recent Labs    01/04/19 1615  NA 140  K 4.4  CL 103  CO2 30  GLUCOSE 73  BUN 14  CREATININE 1.12  CALCIUM 9.2   LFT Recent Labs    01/04/19 1615  PROT 6.9  ALBUMIN 4.0  AST 36  ALT 21  ALKPHOS 81  BILITOT 0.3    Jiya Kissinger L. Tarri Glenn, MD, MPH 01/07/2019, 1:22 PM

## 2019-01-07 NOTE — Telephone Encounter (Signed)
Rx sent to pharmacy pt notified of Dr. Marliss Coots comments and lab appt scheduled

## 2019-01-07 NOTE — Telephone Encounter (Signed)
[facility] to inform [facility]ole needs [facility] for prescription for 1 pill twice a day. Phone number for [name] is [phone].

## 2019-01-07 NOTE — Patient Instructions (Addendum)
I recommend taking a single multivitamin that includes the following: Iron 45 mg daily, vitamin B12 500 mcg daily, folate 800 mcg daily, vitamin D 3000 international units daily, vitamin A 5000 international units daily, vitamin K 300 mcg daily, zinc 8 mg daily, copper 1 mg daily, selenium, magnesium and trace minerals that are contained in a multivitamin such as molybdenum, manganese, and chromium.  You should also take B complex vitamin of at least 50 mg daily.  Let's see if increasing your omeprazole to 40 mg twice daily might improve some of your symptoms. The omeprazole is best taken 30-60 minutes prior to breakfast.  I am also recommending some x-rays (an upper GI series) and then an upper endoscopy to further evaluate and possibly treat your nausea and vomiting.  It was a pleasure to meet you today. Thank you for putting your trust in me. Please call with any questions or concerns prior to your endoscopy.

## 2019-01-10 ENCOUNTER — Telehealth: Payer: Self-pay | Admitting: Emergency Medicine

## 2019-01-10 NOTE — Telephone Encounter (Signed)
[facility] Pre-operative Risk Assessment     Request for surgical clearance:     Endoscopy Procedure  What type of surgery is being performed?    egd   When is this surgery scheduled?     03/02/19  What type of clearance is required ?   Pharmacy  Are there any medications that need to be held prior to surgery and how long? 7 days  Practice name and name of physician performing surgery?      [facility]  What is your office phone and fax number?      Phone- [phone]  [phone]  Anesthesia type (None, local, MAC, general) ?       MAC

## 2019-01-10 NOTE — Telephone Encounter (Signed)
Patient with diagnosis of FACTOR V LEIDEN AND MULTIPLE VTE on WARFARIN for anticoagulation.    Procedure: EGD Date of procedure: 03/01/2018  Per office protocol, patient can hold WARFARIN for 5 days prior to procedure.  Patient WILL need bridging with Lovenox (enoxaparin) around procedure.  *PATIENT NOT FOLLOW BY OUR COUMADIN CLINIC, PLEASE FROWARD TO PCP FOR BRIDGING ARRANGEMENTS*

## 2019-01-10 NOTE — Telephone Encounter (Signed)
Pharmacy can you comment on GI'd request to hold Coumadin for 7 days pre op endoscopy in this patient with factor 5 Leiden and a history of remote (2010) PE and (2011) DVT.  Thanks

## 2019-01-10 NOTE — Telephone Encounter (Signed)
But her omeprazole 40 mg daily wasn't working. Is there a way to appeal? If not, she needs to start using famotidine 20 mg BID. Thanks.

## 2019-01-10 NOTE — Telephone Encounter (Signed)
Patients insurance will only cover pantoprazole once daily.

## 2019-01-11 NOTE — Telephone Encounter (Signed)
   Primary Cardiologist: [name], MD  Chart reviewed as part of pre-operative protocol coverage. Given past medical history and time since last visit, based on ACC/AHA guidelines, SABRYN PRESLAR would be at acceptable risk for the planned procedure without further cardiovascular testing.   The patient can hold her Coumadin 5 days pre op but will need Lovenox bridging.  Her Coumadin is managed by her PCP. I have spoken to the patient and she is familiar with the process and will contact her PCP for bridging.   I will route this recommendation to the requesting party via Epic fax function and remove from pre-op pool.  Please call with questions.  Kerin Ransom, PA-C 01/11/2019, 10:07 AM

## 2019-01-12 NOTE — Telephone Encounter (Signed)
Called [employer] to initiate quantity limit override. They will fax authorization answer within 5 business days.

## 2019-01-13 ENCOUNTER — Ambulatory Visit (HOSPITAL_COMMUNITY)
Admission: RE | Admit: 2019-01-13 | Discharge: 2019-01-13 | Disposition: A | Discharge status: Home / Self Care | Payer: Managed Care, Other (non HMO) | Source: Ambulatory Visit | Attending: Gastroenterology | Admitting: Gastroenterology

## 2019-01-13 ENCOUNTER — Other Ambulatory Visit: Payer: Self-pay

## 2019-01-13 DIAGNOSIS — Z9884 Bariatric surgery status: Secondary | ICD-10-CM

## 2019-01-13 DIAGNOSIS — R112 Nausea with vomiting, unspecified: Secondary | ICD-10-CM | POA: Diagnosis not present

## 2019-01-13 NOTE — Telephone Encounter (Signed)
Received fax from [facility] that omeprazole twice a day has been approved. Patient and pharmacy informed.

## 2019-01-14 ENCOUNTER — Ambulatory Visit (INDEPENDENT_AMBULATORY_CARE_PROVIDER_SITE_OTHER): Payer: Managed Care, Other (non HMO) | Admitting: General Practice

## 2019-01-14 ENCOUNTER — Other Ambulatory Visit (INDEPENDENT_AMBULATORY_CARE_PROVIDER_SITE_OTHER): Payer: Managed Care, Other (non HMO) | Admitting: General Practice

## 2019-01-14 DIAGNOSIS — E538 Deficiency of other specified B group vitamins: Secondary | ICD-10-CM | POA: Diagnosis not present

## 2019-01-14 DIAGNOSIS — Z86711 Personal history of pulmonary embolism: Secondary | ICD-10-CM

## 2019-01-14 DIAGNOSIS — Z7901 Long term (current) use of anticoagulants: Secondary | ICD-10-CM | POA: Diagnosis not present

## 2019-01-14 DIAGNOSIS — D6851 Activated protein C resistance: Secondary | ICD-10-CM

## 2019-01-14 LAB — POCT INR: INR: 1.8 — AB (ref 2.0–3.0)

## 2019-01-14 MED ORDER — CYANOCOBALAMIN 1000 MCG/ML IJ SOLN
1000.0000 ug | Freq: Once | INTRAMUSCULAR | Status: AC
  Administered 2019-01-14: 1000 ug via INTRAMUSCULAR

## 2019-01-14 NOTE — Progress Notes (Signed)
Medical screening examination/treatment/procedure(s) were performed by non-physician practitioner and as supervising physician I was immediately available for consultation/collaboration. I agree with above. James John, MD   

## 2019-01-20 ENCOUNTER — Telehealth: Payer: Self-pay | Admitting: Gastroenterology

## 2019-01-20 NOTE — Telephone Encounter (Signed)
Spoke with patient and informed her that procedure will be on 03-08-19, and we will update her instructions in Mychart. She verbalized understanding.

## 2019-01-25 ENCOUNTER — Other Ambulatory Visit: Payer: Self-pay | Admitting: Family Medicine

## 2019-01-25 ENCOUNTER — Ambulatory Visit: Payer: Managed Care, Other (non HMO)

## 2019-02-08 ENCOUNTER — Other Ambulatory Visit: Payer: Self-pay

## 2019-02-08 ENCOUNTER — Ambulatory Visit (INDEPENDENT_AMBULATORY_CARE_PROVIDER_SITE_OTHER): Payer: Managed Care, Other (non HMO) | Admitting: General Practice

## 2019-02-08 DIAGNOSIS — Z7901 Long term (current) use of anticoagulants: Secondary | ICD-10-CM | POA: Diagnosis not present

## 2019-02-08 DIAGNOSIS — E538 Deficiency of other specified B group vitamins: Secondary | ICD-10-CM | POA: Diagnosis not present

## 2019-02-08 DIAGNOSIS — Z86711 Personal history of pulmonary embolism: Secondary | ICD-10-CM

## 2019-02-08 DIAGNOSIS — D6851 Activated protein C resistance: Secondary | ICD-10-CM

## 2019-02-08 LAB — POCT INR: INR: 3.7 — AB (ref 2.0–3.0)

## 2019-02-08 MED ORDER — CYANOCOBALAMIN 1000 MCG/ML IJ SOLN
1000.0000 ug | Freq: Once | INTRAMUSCULAR | Status: AC
  Administered 2019-02-08: 10:00:00 1000 ug via INTRAMUSCULAR

## 2019-02-08 NOTE — Progress Notes (Signed)
Medical screening examination/treatment/procedure(s) were performed by non-physician practitioner and as supervising physician I was immediately available for consultation/collaboration. I agree with above. Haruna Rohlfs, MD   

## 2019-02-08 NOTE — Patient Instructions (Addendum)
.  lbpcmh  Continue 1 1/2 tablets daily except 2 tablets on  Sundays and Thursdays.  Re-check on 2/16.

## 2019-02-14 ENCOUNTER — Other Ambulatory Visit: Payer: Self-pay | Admitting: General Practice

## 2019-02-15 ENCOUNTER — Telehealth: Payer: Self-pay | Admitting: General Practice

## 2019-02-15 ENCOUNTER — Other Ambulatory Visit: Payer: Self-pay | Admitting: General Practice

## 2019-02-15 DIAGNOSIS — Z7901 Long term (current) use of anticoagulants: Secondary | ICD-10-CM

## 2019-02-15 MED ORDER — ENOXAPARIN SODIUM 150 MG/ML ~~LOC~~ SOLN: 150.0000 mg | Freq: Two times a day (BID) | SUBCUTANEOUS | 0 refills | Status: DC

## 2019-02-15 NOTE — Telephone Encounter (Signed)
-----   Message from Judy Pimple, MD sent at 02/08/2019 12:51 PM EST ----- Regarding: RE: Lovenox bridge Yes, please do  I appreciate it  ----- Message ----- From: Garrison Columbus, RN Sent: 02/08/2019  10:39 AM EST To: Judy Pimple, MD Subject: Lovenox bridge                                 Hi Dr. Milinda Antis,  Patient is having a procedure (I believe a endoscopy) on 2/9.  She will need to stop coumadin for 5 days and be bridged with Lovenox.  Do I have your permission to dose and send to pharmacy?  Please advise.  Thanks! Bailey Mech, RN

## 2019-02-15 NOTE — Telephone Encounter (Signed)
Instructions for coumadin and Lovenox pre and post procedure on 2/9.  2/4 - Last dose of coumadin until after procedure 2/5 - Nothing - No coumadin and No Lovenox 2/6 - Lovenox in the AM and PM (12 hours apart) 2/7 - Lovenox in the AM and PM 2/8 - Lovenox in the AM only 2/9 - Procedure (Do not take Lovenox today) 2/10 - Lovenox in the AM and PM AND 2 1/2 tablets of coumadin 2/11 - Lovenox in the AM and PM AND 2 1/2 tablets of coumadin 2/12 - Lovenox in the AM and PM and 2 1/2 tablets of coumadin 2/13 - Lovenox in the AM and PM and 2 1/2 tablets of coumadin 2/14 - Stop Lovenox and continue coumadin, 1 1/2 tablets daily except 2 tablets on Sundays.  2/16 - Re-check INR at 9:00

## 2019-02-17 ENCOUNTER — Telehealth: Payer: Self-pay | Admitting: Family Medicine

## 2019-02-17 DIAGNOSIS — E890 Postprocedural hypothyroidism: Secondary | ICD-10-CM

## 2019-02-17 NOTE — Telephone Encounter (Signed)
-----   Message from Alvina Chou sent at 02/09/2019  4:14 PM EST ----- Regarding: Lab orders for Friday, 1.15.21 Lab orders for Thyroid?

## 2019-02-18 ENCOUNTER — Other Ambulatory Visit (INDEPENDENT_AMBULATORY_CARE_PROVIDER_SITE_OTHER): Payer: Managed Care, Other (non HMO)

## 2019-02-18 ENCOUNTER — Other Ambulatory Visit: Payer: Self-pay

## 2019-02-18 DIAGNOSIS — E890 Postprocedural hypothyroidism: Secondary | ICD-10-CM

## 2019-02-18 LAB — TSH: TSH: 13.35 u[IU]/mL — ABNORMAL HIGH (ref 0.35–4.50)

## 2019-02-22 ENCOUNTER — Telehealth: Payer: Self-pay | Admitting: *Deleted

## 2019-02-22 NOTE — Telephone Encounter (Signed)
Left VM requesting pt to call the office back regarding TSH lab

## 2019-02-23 NOTE — Progress Notes (Deleted)
Cardiology Office Note   Date:  02/23/2019   ID:  [name], DOB 01/26/1966, MRN [id number]  PCP:  Judy Pimple, MD  Cardiologist:   Dietrich Pates, MD   F/u of AOrtic stenosis   History of Present Illness: [name] is a 54 y.o. female with a history of factor V leiden  Hx of PE and DVT as well as intracerebral hemorrhage  Sees Drs Milinda Antis, colodonato and [place].  Mild to mod AS on echo  I last saw the pt  in  Jan 2018   She has been seen by B Bhagat since     She denies CP   No dizzines   Breathing is OK  No PND Biggest complaint is migraine HA   Being evaluated by Dr Haskell Riling      Echo on 12/2016  Mean gradient was 25 m across AV  Relatively unchanged from previous   Pumping normal     No outpatient medications have been marked as taking for the 02/25/19 encounter (Appointment) with Pricilla Riffle, MD.   Current Facility-Administered Medications for the 02/25/19 encounter (Appointment) with Pricilla Riffle, MD  Medication  . cyanocobalamin ((VITAMIN B-12)) injection 1,000 mcg  . cyanocobalamin ((VITAMIN B-12)) injection 1,000 mcg     Allergies:   Lisinopril, Lovenox [enoxaparin sodium], Moxifloxacin, and Amoxicillin   Past Medical History:  Diagnosis Date  . Anxiety state, unspecified    panic attacks  . Arthritis    Lupus - hands/knees  . DVT (deep venous thrombosis) (HCC)   . Edema   . 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 GERD (gastroesophageal reflux disease)    on prilosec r/t gastric sleeve surgery  . HLD (hyperlipidemia)   . Iron deficiency anemia    IV dextran Coladonato  . Nephritis and nephropathy, not specified as acute or chronic, with unspecified pathological lesion in kidney   . Nonspecific abnormal results of liver function study   . Obesity, unspecified   . Other pulmonary embolism and infarction   . Other specified acquired  hypothyroidism   . Panic disorder without agoraphobia   . Personal history of venous thrombosis and embolism 2002   during pregnancy; ?factor 5 leiden (sees heme)  . Pleural effusion 2005   c/w lupus initial w/u; recurrent Right as pred tapered off July 2011  . Pure hypercholesterolemia   . Sleep apnea   . 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 Unspecified essential hypertension   . Unspecified pleural effusion     Past Surgical History:  Procedure Laterality Date  . gastric sleeve  8/12   bariatric surgery Dr Adolphus Birchwood   . LAPAROSCOPIC TUBAL LIGATION  12/09/2010   Procedure: LAPAROSCOPIC TUBAL LIGATION;  Surgeon: Juluis Mire;  Location: [facility];  Service: Gynecology;  Laterality: Bilateral;  Attempted see Nursing note  . pleurocentesis  10/2004  . Pleuryx catheter placement  9/11    VanTright----removed 9/11  . RENAL BIOPSY  09/24/2012  . svd     x 3  . THORACENTESIS  2010   with penumonia  . WISDOM TOOTH EXTRACTION       Social History:  The patient  reports that she quit smoking about 9 years ago. Her smoking use included cigarettes. She has a 5.00 pack-year smoking history. She  has never used smokeless tobacco. She reports current alcohol use. She reports that she does not use drugs.   Family History:  [name] family history includes Diabetes in an other family member; Hyperlipidemia in her father; Hypertension in her father; Leukemia in her sister; Lupus in her sister.    ROS:  Please see the history of present illness. All other systems are reviewed and  Negative to the above problem except as noted.    PHYSICAL EXAM: VS:  LMP 11/08/2010   GEN:  Morbidly obese 54 yo , in no acute distress  HEENT: normal  Neck: no JVD, carotid bruits, or masses Cardiac: RRR; Gr IIIVI systolic mid peaking systolic  murmur at base, rubs, or gallops,no edema  Respiratory:  clear to auscultation bilaterally, normal work of breathing GI:  soft, nontender, nondistended, + BS  No hepatomegaly  MS: no deformity Moving all extremities   Skin: warm and dry, no rash Neuro:  Strength and sensation are intact Psych: euthymic mood, full affect   EKG:  EKG is not ordered     Lipid Panel    Component Value Date/Time   CHOL 206 (H) 01/04/2019 1615   TRIG 124.0 01/04/2019 1615   HDL 49.50 01/04/2019 1615   CHOLHDL 4 01/04/2019 1615   VLDL 24.8 01/04/2019 1615   LDLCALC 131 (H) 01/04/2019 1615   LDLDIRECT 203.7 06/03/2011 1433      Wt Readings from Last 3 Encounters:  01/07/19 (!) 322 lb (146.1 kg)  01/04/19 (!) 320 lb 3 oz (145.2 kg)  01/11/18 (!) 310 lb (140.6 kg)      ASSESSMENT AND PLAN:  1  AS   Modeate by echo in 2018   Murmur is not significantly changed  Pt remains asymptomatic   F/U with echo next year and in clinic   Call if SOB, CP , dizzy   2  Hx DVT/PE  Countinue coumadin    3  HL  LDL 108  On Crestor 20  Watch saturated fats   4  HTN BP is good   Keep on same meds   5   HA  Follow with Dr Domingo Cocking   Current medicines are reviewed at length with the patient today.  The patient does not have concerns regarding medicines.  Signed, [name], MD  02/23/2019 8:24 AM    [facility] [address], [place], [place]  [address] Phone: [phone]; Fax: [phone]

## 2019-02-23 NOTE — Telephone Encounter (Signed)
Left 2nd VM requesting pt to call the office back  

## 2019-02-25 ENCOUNTER — Ambulatory Visit: Payer: Managed Care, Other (non HMO) | Admitting: Internal Medicine

## 2019-02-26 ENCOUNTER — Other Ambulatory Visit (HOSPITAL_COMMUNITY): Payer: Managed Care, Other (non HMO)

## 2019-02-28 ENCOUNTER — Other Ambulatory Visit: Payer: Self-pay | Admitting: Family Medicine

## 2019-02-28 DIAGNOSIS — Z7901 Long term (current) use of anticoagulants: Secondary | ICD-10-CM

## 2019-02-28 NOTE — Telephone Encounter (Signed)
Routing to coumadin nurse  

## 2019-03-03 ENCOUNTER — Other Ambulatory Visit (HOSPITAL_COMMUNITY): Payer: Managed Care, Other (non HMO)

## 2019-03-04 ENCOUNTER — Other Ambulatory Visit (HOSPITAL_COMMUNITY): Payer: Managed Care, Other (non HMO)

## 2019-03-06 NOTE — Progress Notes (Deleted)
Cardiology Office Note   Date:  03/06/2019   ID:  [name], DOB Sep 30, 1965, MRN [id number]  PCP:  Judy Pimple, MD  Cardiologist:   Dietrich Pates, MD   F/u of AOrtic stenosis   History of Present Illness: [name] is a 54 y.o. female with a history of factor V leiden  Hx of PE and DVT as well as intracerebral hemorrhage  Sees Drs Milinda Antis, colodonato and [place].  Mild to mod AS on echo Echo on 12/2016  Mean gradient was 25 m across AV  Relatively unchanged from previous   Pumping normal    I saw the pt in Dec 2019    No outpatient medications have been marked as taking for the 03/07/19 encounter (Appointment) with Pricilla Riffle, MD.   Current Facility-Administered Medications for the 03/07/19 encounter (Appointment) with Pricilla Riffle, MD  Medication  . cyanocobalamin ((VITAMIN B-12)) injection 1,000 mcg  . cyanocobalamin ((VITAMIN B-12)) injection 1,000 mcg     Allergies:   Lisinopril, Lovenox [enoxaparin sodium], Moxifloxacin, and Amoxicillin   Past Medical History:  Diagnosis Date  . Anxiety state, unspecified    panic attacks  . Arthritis    Lupus - hands/knees  . DVT (deep venous thrombosis) (HCC)   . Edema   . 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 GERD (gastroesophageal reflux disease)    on prilosec r/t gastric sleeve surgery  . HLD (hyperlipidemia)   . Iron deficiency anemia    IV dextran Coladonato  . Nephritis and nephropathy, not specified as acute or chronic, with unspecified pathological lesion in kidney   . Nonspecific abnormal results of liver function study   . Obesity, unspecified   . Other pulmonary embolism and infarction   . Other specified acquired hypothyroidism   . Panic disorder without agoraphobia   . Personal history of venous thrombosis and embolism 2002   during pregnancy; ?factor 5 leiden (sees heme)  . Pleural effusion  2005   c/w lupus initial w/u; recurrent Right as pred tapered off July 2011  . Pure hypercholesterolemia   . Sleep apnea   . 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 Unspecified essential hypertension   . Unspecified pleural effusion     Past Surgical History:  Procedure Laterality Date  . gastric sleeve  8/12   bariatric surgery Dr Adolphus Birchwood   . LAPAROSCOPIC TUBAL LIGATION  12/09/2010   Procedure: LAPAROSCOPIC TUBAL LIGATION;  Surgeon: Juluis Mire;  Location: [facility];  Service: Gynecology;  Laterality: Bilateral;  Attempted see Nursing note  . pleurocentesis  10/2004  . Pleuryx catheter placement  9/11    VanTright----removed 9/11  . RENAL BIOPSY  09/24/2012  . svd     x 3  . THORACENTESIS  2010   with penumonia  . WISDOM TOOTH EXTRACTION       Social History:  The patient  reports that she quit smoking about 9 years ago. Her smoking use included cigarettes. She has a 5.00 pack-year smoking history. She has never used smokeless tobacco. She reports current alcohol use. She reports that she does not use drugs.   Family History:  [name] family history includes Diabetes in an other family member; Hyperlipidemia in her father; Hypertension in her father; Leukemia in her sister; Lupus in  her sister.    ROS:  Please see the history of present illness. All other systems are reviewed and  Negative to the above problem except as noted.    PHYSICAL EXAM: VS:  LMP 11/08/2010   GEN:  Morbidly obese 54 yo , in no acute distress  HEENT: normal  Neck: no JVD, carotid bruits, or masses Cardiac: RRR; Gr IIIVI systolic mid peaking systolic  murmur at base, rubs, or gallops,no edema  Respiratory:  clear to auscultation bilaterally, normal work of breathing GI: soft, nontender, nondistended, + BS  No hepatomegaly  MS: no deformity Moving all extremities   Skin: warm and dry, no rash Neuro:  Strength and sensation are intact Psych: euthymic  mood, full affect   EKG:  EKG is not ordered     Lipid Panel    Component Value Date/Time   CHOL 206 (H) 01/04/2019 1615   TRIG 124.0 01/04/2019 1615   HDL 49.50 01/04/2019 1615   CHOLHDL 4 01/04/2019 1615   VLDL 24.8 01/04/2019 1615   LDLCALC 131 (H) 01/04/2019 1615   LDLDIRECT 203.7 06/03/2011 1433      Wt Readings from Last 3 Encounters:  01/07/19 (!) 322 lb (146.1 kg)  01/04/19 (!) 320 lb 3 oz (145.2 kg)  01/11/18 (!) 310 lb (140.6 kg)      ASSESSMENT AND PLAN:  1  AS   Modeate by echo in 2018   Murmur is not significantly changed  Pt remains asymptomatic   F/U with echo next year and in clinic   Call if SOB, CP , dizzy   2  Hx DVT/PE  Countinue coumadin    3  HL  LDL 108  On Crestor 20  Watch saturated fats   4  HTN BP is good   Keep on same meds   5   HA  Follow with Dr Domingo Cocking   Current medicines are reviewed at length with the patient today.  The patient does not have concerns regarding medicines.  Signed, [name], MD  03/06/2019 2:31 PM    [facility] [address], [place], [address]  [address] Phone: [phone]; Fax: [phone]

## 2019-03-07 ENCOUNTER — Ambulatory Visit: Payer: Managed Care, Other (non HMO) | Admitting: Internal Medicine

## 2019-03-11 ENCOUNTER — Other Ambulatory Visit (HOSPITAL_COMMUNITY): Payer: Managed Care, Other (non HMO)

## 2019-03-15 ENCOUNTER — Encounter (HOSPITAL_COMMUNITY): Payer: Self-pay

## 2019-03-15 ENCOUNTER — Ambulatory Visit (HOSPITAL_COMMUNITY): Admit: 2019-03-15 | Payer: Managed Care, Other (non HMO) | Admitting: Gastroenterology

## 2019-03-15 SURGERY — ESOPHAGOGASTRODUODENOSCOPY (EGD) WITH PROPOFOL
Anesthesia: Monitor Anesthesia Care

## 2019-03-21 ENCOUNTER — Other Ambulatory Visit: Payer: Self-pay | Admitting: Emergency Medicine

## 2019-03-21 DIAGNOSIS — Z9884 Bariatric surgery status: Secondary | ICD-10-CM

## 2019-03-21 DIAGNOSIS — R112 Nausea with vomiting, unspecified: Secondary | ICD-10-CM

## 2019-03-21 DIAGNOSIS — Z6841 Body Mass Index (BMI) 40.0 and over, adult: Secondary | ICD-10-CM

## 2019-03-22 ENCOUNTER — Ambulatory Visit (INDEPENDENT_AMBULATORY_CARE_PROVIDER_SITE_OTHER): Payer: Managed Care, Other (non HMO) | Admitting: General Practice

## 2019-03-22 ENCOUNTER — Other Ambulatory Visit: Payer: Self-pay

## 2019-03-22 DIAGNOSIS — E538 Deficiency of other specified B group vitamins: Secondary | ICD-10-CM | POA: Diagnosis not present

## 2019-03-22 DIAGNOSIS — Z7901 Long term (current) use of anticoagulants: Secondary | ICD-10-CM

## 2019-03-22 DIAGNOSIS — Z86711 Personal history of pulmonary embolism: Secondary | ICD-10-CM

## 2019-03-22 DIAGNOSIS — D6851 Activated protein C resistance: Secondary | ICD-10-CM

## 2019-03-22 LAB — POCT INR: INR: 4 — AB (ref 2.0–3.0)

## 2019-03-22 MED ORDER — CYANOCOBALAMIN 1000 MCG/ML IJ SOLN
1000.0000 ug | Freq: Once | INTRAMUSCULAR | Status: AC
  Administered 2019-03-22: 10:00:00 1000 ug via INTRAMUSCULAR

## 2019-03-22 NOTE — Progress Notes (Signed)
Medical screening examination/treatment/procedure(s) were performed by non-physician practitioner and as supervising physician I was immediately available for consultation/collaboration. I agree with above. Enrica Corliss, MD   

## 2019-03-22 NOTE — Patient Instructions (Addendum)
Pre visit review using our clinic review tool, if applicable. No additional management support is needed unless otherwise documented below in the visit note.  Hold coumadin today and then change dosage and take 1 1/2 tablets daily except 2 tablets on  Sundays.  Re-check on 3/16.  Please follow directions in regards to Lovenox bridge.

## 2019-04-08 ENCOUNTER — Other Ambulatory Visit (HOSPITAL_COMMUNITY)
Admission: RE | Admit: 2019-04-08 | Discharge: 2019-04-08 | Disposition: A | Discharge status: Home / Self Care | Payer: 59 | Source: Ambulatory Visit | Attending: Gastroenterology | Admitting: Gastroenterology

## 2019-04-08 DIAGNOSIS — Z01812 Encounter for preprocedural laboratory examination: Secondary | ICD-10-CM | POA: Insufficient documentation

## 2019-04-08 DIAGNOSIS — Z20822 Contact with and (suspected) exposure to covid-19: Secondary | ICD-10-CM | POA: Insufficient documentation

## 2019-04-08 LAB — SARS CORONAVIRUS 2 (TAT 6-24 HRS): SARS Coronavirus 2: NEGATIVE

## 2019-04-12 ENCOUNTER — Ambulatory Visit (HOSPITAL_COMMUNITY): Payer: 59 | Admitting: Anesthesiology

## 2019-04-12 ENCOUNTER — Ambulatory Visit (HOSPITAL_COMMUNITY)
Admission: RE | Admit: 2019-04-12 | Discharge: 2019-04-12 | Disposition: A | Discharge status: Home / Self Care | Payer: 59 | Attending: Gastroenterology | Admitting: Gastroenterology

## 2019-04-12 ENCOUNTER — Encounter (HOSPITAL_COMMUNITY)
Admission: RE | Disposition: A | Discharge status: Home / Self Care | Payer: Self-pay | Source: Home / Self Care | Attending: Gastroenterology

## 2019-04-12 ENCOUNTER — Encounter (HOSPITAL_COMMUNITY): Payer: Self-pay | Admitting: Gastroenterology

## 2019-04-12 ENCOUNTER — Other Ambulatory Visit: Payer: Self-pay

## 2019-04-12 DIAGNOSIS — I129 Hypertensive chronic kidney disease with stage 1 through stage 4 chronic kidney disease, or unspecified chronic kidney disease: Secondary | ICD-10-CM | POA: Diagnosis not present

## 2019-04-12 DIAGNOSIS — Z6841 Body Mass Index (BMI) 40.0 and over, adult: Secondary | ICD-10-CM | POA: Diagnosis not present

## 2019-04-12 DIAGNOSIS — Z880 Allergy status to penicillin: Secondary | ICD-10-CM | POA: Insufficient documentation

## 2019-04-12 DIAGNOSIS — Z87891 Personal history of nicotine dependence: Secondary | ICD-10-CM | POA: Diagnosis not present

## 2019-04-12 DIAGNOSIS — Z86711 Personal history of pulmonary embolism: Secondary | ICD-10-CM | POA: Diagnosis not present

## 2019-04-12 DIAGNOSIS — M1990 Unspecified osteoarthritis, unspecified site: Secondary | ICD-10-CM | POA: Insufficient documentation

## 2019-04-12 DIAGNOSIS — Z8349 Family history of other endocrine, nutritional and metabolic diseases: Secondary | ICD-10-CM | POA: Insufficient documentation

## 2019-04-12 DIAGNOSIS — I350 Nonrheumatic aortic (valve) stenosis: Secondary | ICD-10-CM | POA: Insufficient documentation

## 2019-04-12 DIAGNOSIS — E7800 Pure hypercholesterolemia, unspecified: Secondary | ICD-10-CM | POA: Insufficient documentation

## 2019-04-12 DIAGNOSIS — N182 Chronic kidney disease, stage 2 (mild): Secondary | ICD-10-CM | POA: Insufficient documentation

## 2019-04-12 DIAGNOSIS — K317 Polyp of stomach and duodenum: Secondary | ICD-10-CM

## 2019-04-12 DIAGNOSIS — D509 Iron deficiency anemia, unspecified: Secondary | ICD-10-CM | POA: Insufficient documentation

## 2019-04-12 DIAGNOSIS — K219 Gastro-esophageal reflux disease without esophagitis: Secondary | ICD-10-CM | POA: Diagnosis not present

## 2019-04-12 DIAGNOSIS — E669 Obesity, unspecified: Secondary | ICD-10-CM | POA: Diagnosis not present

## 2019-04-12 DIAGNOSIS — Z86718 Personal history of other venous thrombosis and embolism: Secondary | ICD-10-CM | POA: Insufficient documentation

## 2019-04-12 DIAGNOSIS — Z7901 Long term (current) use of anticoagulants: Secondary | ICD-10-CM | POA: Diagnosis not present

## 2019-04-12 DIAGNOSIS — E785 Hyperlipidemia, unspecified: Secondary | ICD-10-CM | POA: Diagnosis not present

## 2019-04-12 DIAGNOSIS — E039 Hypothyroidism, unspecified: Secondary | ICD-10-CM | POA: Insufficient documentation

## 2019-04-12 DIAGNOSIS — G4730 Sleep apnea, unspecified: Secondary | ICD-10-CM | POA: Diagnosis not present

## 2019-04-12 DIAGNOSIS — E1122 Type 2 diabetes mellitus with diabetic chronic kidney disease: Secondary | ICD-10-CM | POA: Diagnosis not present

## 2019-04-12 DIAGNOSIS — Z833 Family history of diabetes mellitus: Secondary | ICD-10-CM | POA: Insufficient documentation

## 2019-04-12 DIAGNOSIS — Z881 Allergy status to other antibiotic agents status: Secondary | ICD-10-CM | POA: Insufficient documentation

## 2019-04-12 DIAGNOSIS — Z888 Allergy status to other drugs, medicaments and biological substances status: Secondary | ICD-10-CM | POA: Insufficient documentation

## 2019-04-12 DIAGNOSIS — K228 Other specified diseases of esophagus: Secondary | ICD-10-CM | POA: Diagnosis not present

## 2019-04-12 DIAGNOSIS — K2950 Unspecified chronic gastritis without bleeding: Secondary | ICD-10-CM | POA: Diagnosis not present

## 2019-04-12 DIAGNOSIS — Z8489 Family history of other specified conditions: Secondary | ICD-10-CM | POA: Insufficient documentation

## 2019-04-12 DIAGNOSIS — Z8249 Family history of ischemic heart disease and other diseases of the circulatory system: Secondary | ICD-10-CM | POA: Insufficient documentation

## 2019-04-12 DIAGNOSIS — D6851 Activated protein C resistance: Secondary | ICD-10-CM | POA: Insufficient documentation

## 2019-04-12 DIAGNOSIS — Z9884 Bariatric surgery status: Secondary | ICD-10-CM | POA: Diagnosis not present

## 2019-04-12 DIAGNOSIS — M3214 Glomerular disease in systemic lupus erythematosus: Secondary | ICD-10-CM | POA: Diagnosis not present

## 2019-04-12 DIAGNOSIS — Z806 Family history of leukemia: Secondary | ICD-10-CM | POA: Insufficient documentation

## 2019-04-12 HISTORY — PX: ESOPHAGOGASTRODUODENOSCOPY (EGD) WITH PROPOFOL: SHX5813

## 2019-04-12 HISTORY — PX: BIOPSY: SHX5522

## 2019-04-12 SURGERY — ESOPHAGOGASTRODUODENOSCOPY (EGD) WITH PROPOFOL
Anesthesia: Monitor Anesthesia Care

## 2019-04-12 MED ORDER — SODIUM CHLORIDE 0.9 % IV SOLN: INTRAVENOUS | Status: DC

## 2019-04-12 MED ORDER — PROPOFOL 500 MG/50ML IV EMUL
INTRAVENOUS | Status: DC | PRN
  Administered 2019-04-12: 100 ug/kg/min via INTRAVENOUS
  Administered 2019-04-12: 40 mg via INTRAVENOUS
  Administered 2019-04-12: 60 mg via INTRAVENOUS

## 2019-04-12 MED ORDER — ONDANSETRON HCL 4 MG/2ML IJ SOLN
INTRAMUSCULAR | Status: DC | PRN
  Administered 2019-04-12: 4 mg via INTRAVENOUS

## 2019-04-12 MED ORDER — LIDOCAINE HCL (CARDIAC) PF 100 MG/5ML IV SOSY
PREFILLED_SYRINGE | INTRAVENOUS | Status: DC | PRN
  Administered 2019-04-12: 100 mg via INTRAVENOUS

## 2019-04-12 MED ORDER — LACTATED RINGERS IV SOLN
INTRAVENOUS | Status: DC
  Administered 2019-04-12: 1000 mL via INTRAVENOUS

## 2019-04-12 MED ORDER — PROPOFOL 500 MG/50ML IV EMUL
INTRAVENOUS | Status: AC
  Filled 2019-04-12: qty 50

## 2019-04-12 MED ORDER — PROPOFOL 10 MG/ML IV BOLUS
INTRAVENOUS | Status: AC
  Filled 2019-04-12: qty 20

## 2019-04-12 SURGICAL SUPPLY — 14 items

## 2019-04-12 NOTE — Op Note (Addendum)
[facility] Patient Name: [name] Procedure Date: 04/12/2019 MRN: [id number] Attending MD: Tressia Danas MD, MD Date of Birth: Jun 28, 1965 CSN: [id number] Age: 54 Admit Type: Outpatient Procedure:                Upper GI endoscopy Indications:              Unexplained iron deficiency anemia, Nausea with                            vomiting, abnormal UGI series                           Nausea and vomiting since gastric sleeve, worse                            over the last 3 years                           History of gastric sleeve                           - performed at [facility]                           Iron deficiency requiring IV iron infusions Providers:                Tressia Danas MD, MD, Dwain Sarna, RN, Tillie Fantasia, RN, Lawson Radar, Technician, Arlee Muslim Tech., Technician, Greig Right, CRNA Referring MD:              Medicines:                Monitored Anesthesia Care Complications:            No immediate complications. Estimated blood loss:                            Minimal. Estimated Blood Loss:     Estimated blood loss was minimal. Procedure:                Pre-Anesthesia Assessment:                           - Prior to the procedure, a History and Physical                            was performed, and patient medications and                            allergies were reviewed. [name] tolerance of                            previous anesthesia was also reviewed. The risks  and benefits of the procedure and the sedation                            options and risks were discussed with the patient.                            All questions were answered, and informed consent                            was obtained. Prior Anticoagulants: The patient has                            taken Coumadin (warfarin). ASA Grade Assessment:                     III - A patient with severe systemic disease. After                            reviewing the risks and benefits, the patient was                            deemed in satisfactory condition to undergo the                            procedure.                           After obtaining informed consent, the endoscope was                            passed under direct vision. Throughout the                            procedure, the patient's blood pressure, pulse, and                            oxygen saturations were monitored continuously. The                            GIF-H190 ([id number]) Olympus gastroscope was                            introduced through the mouth, and advanced to the                            third part of duodenum. The upper GI endoscopy was                            accomplished without difficulty. The patient                            tolerated the procedure well. Scope In: Scope Out: Findings:      The lumen of the esophagus was moderately dilated. In the most distal       esophagus the mucous  is abnormal with a slightly verrucuous appearance       and a loss of vascular pattern. This may be a large diverticulum. This       appears to involving 1/2 of the circumference of the esophagus. The       z-line was normal. No obvious stricture. No resistance to passage of the       gastroscope at any point in the esophagus. Biopsies of the abnormal       mucosa were taken with a cold forceps for histology. Estimated blood       loss was minimal.      The remainder of the esophageal mucosa appeared normal. Biopsies were       taken from the mid and proximal esophagus with a cold forceps for       histology. Estimated blood loss was minimal.      A single small sessile polyp with no stigmata of recent bleeding was       found in the gastric body. This was biopsied with a cold forceps for       histology. The remainder of the examined gastric mucosal  appeared       normal. Biopsies of the antrum, body, and fundus were obtained with cold       forceps for histology. Estimated blood loss was minimal.      The examined duodenum was normal. Biopsies were taken with a cold       forceps for histology. Estimated blood loss was minimal.      The cardia and gastric fundus were normal on retroflexion. Impression:               - Dilated esophagus, most pronounced in the distal                            esophagus.                           - Esophageal mucosal abnormality - ? diverticulum.                            Biopsied. Biopsies also obtained from the mid and                            proximal esophagus.                           - A single gastric polyp. Biopsied.                           - Normal examined duodenum. Biopsied.                           - No obvious source for iron deficiency identified.                            Await biopsy results. Moderate Sedation:      Not Applicable - Patient had care per Anesthesia. Recommendation:           - Patient has a contact number available for  emergencies. The signs and symptoms of potential                            delayed complications were discussed with the                            patient. Return to normal activities tomorrow.                            Written discharge instructions were provided to the                            patient.                           - Resume previous diet.                           - Continue present medications. I did not change                            any medications today.                           - Await pathology results.                           - Resume warfarin tonight.                           - Return to my office to review these results. Procedure Code(s):        --- Professional ---                           [id number], Esophagogastroduodenoscopy, flexible,                            transoral; with  biopsy, single or multiple Diagnosis Code(s):        --- Professional ---                           K22.8, Other specified diseases of esophagus                           K31.7, Polyp of stomach and duodenum                           D50.9, Iron deficiency anemia, unspecified                           R11.2, Nausea with vomiting, unspecified CPT copyright 2019 [employer]. All rights reserved. The codes documented in this report are preliminary and upon coder review may  be revised to meet current compliance requirements. Tressia Danas MD, MD 04/12/2019 12:37:48 PM This report has been signed electronically. Number of Addenda: 0

## 2019-04-12 NOTE — Anesthesia Postprocedure Evaluation (Signed)
Anesthesia Post Note  Patient: [name]  Procedure(s) Performed: ESOPHAGOGASTRODUODENOSCOPY (EGD) WITH PROPOFOL (N/A )     Patient location during evaluation: PACU Anesthesia Type: MAC Level of consciousness: awake and alert Pain management: pain level controlled Vital Signs Assessment: post-procedure vital signs reviewed and stable Respiratory status: spontaneous breathing, nonlabored ventilation and respiratory function stable Cardiovascular status: blood pressure returned to baseline and stable Postop Assessment: no apparent nausea or vomiting Anesthetic complications: no    Last Vitals:  Vitals:   04/12/19 1233 04/12/19 1240  BP: 123/78 117/75  Pulse: 80 79  Resp: 18 (!) 25  Temp: (!) 36.1 C   SpO2: 100% 98%    Last Pain:  Vitals:   04/12/19 1240  TempSrc:   PainSc: 0-No pain                 Jarome Matin Caidance Sybert

## 2019-04-12 NOTE — H&P (Signed)
Referring Provider: No ref. provider found Primary Care Physician:  Price, Cheryl [name], MD  Reason for Consultation:  "I want to check things out"   IMPRESSION: Nausea and vomiting since gastric sleeve, worse over the last 3 years History of gastric sleeve    - performed at [facility] Iron deficiency requiring IV iron infusions BMI 55.27 TSH 10.03 01/04/19 Colonoscopy at age 54 at [facility] No known family history of colon cancer or polyps  Nausea and vomiting may be due to stenosis of the gastric sleeve, hiatal hernia or GERD. Reflux may be exacerbated by recent weight gain. Will attempt aggressive antireflux medical therapy. EGD and UGI series to evaluate for anatomic abnormalities recommended. Anastomosis. If no response to medical therapy and anatomy is normal, may consider conversion to RYGB.      PLAN: EGD today    HPI: [name] is a 54 y.o. female referred by Cheryl. Glori Price for nausea and vomiting. The history is obtained through the patient and review of her electronic health record.  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 She has a history of DVT due to Factor V Leiden and is on warfarin through [facility].   Gastric sleeve performed at [place] around 2012. She does not remember the surgeon's name. Had immediate post-op care but has not followed up with a surgeon or bariatric clinic since that time.  She takes a a vitabrand general multivitamin daily, b12 injection monthly, uses iron supplements. Has required iron infusions on a few occassions since her surgery.    Reports intermittent post-prandial nausea and vomiting since her  gastric sleeve. Worse over the last 3 years such that she has needed to increase her medication doses.  Symptoms start 10-15 minutes after eating. Often relieved after vomiting undigested food. Bloating that is not related to eating   Keeping a food journal but has not identified specific triggers.  Intermittently thought rice and some lettuces might be triggers.  Warm drinks can provide some relief.  No dysphagia, odynophagia.  She uses omeprazole OTC daily and Tums PRN. Will rarely need PeptoBismal for additional relief. Unintentional weight gain.    Concerned that she may have damaged or stretched her stomach.  Feels her symptoms may be related to her sleeve..   Uses tylenol for tooth pain.  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   No recent abdominal imaging. No prior upper endoscopy.  Had a colonoscopy at age 4 with [facility].   Maternal grandmother with colon polyps. No other known family history of colon cancer or polyps. No family history of uterine/endometrial cancer, pancreatic cancer or gastric/stomach cancer.  Labs 01/04/19: Normal CMP, B12 705, hgb 11.4, MCV 81.5, RDW 17.1, platelets 244, TSH 10.03, HgbA1c 5.7   Past Medical History:  Diagnosis Date  . Anxiety state, unspecified    panic attacks  . Arthritis    Lupus - hands/knees  . DVT (deep venous thrombosis) ([place])   . Edema   . 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 GERD (gastroesophageal reflux disease)    on prilosec r/t gastric  sleeve surgery  . HLD (hyperlipidemia)   . Iron deficiency anemia    IV dextran [place]  . Nephritis and nephropathy, not specified as acute or chronic, with unspecified pathological lesion in kidney   . Nonspecific abnormal results of liver function study   . Obesity, unspecified   . Other pulmonary embolism and infarction   . Other specified acquired  hypothyroidism   . Panic disorder without agoraphobia   . Personal history of venous thrombosis and embolism 2002   during pregnancy; ?factor 5 leiden (sees heme)  . Pleural effusion 2005   c/w lupus initial w/u; recurrent Right as pred tapered off July 2011  . Pure hypercholesterolemia   . Sleep apnea   . 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 Unspecified essential hypertension   . Unspecified pleural effusion     Past Surgical History:  Procedure Laterality Date  . gastric sleeve  8/12   bariatric surgery [name]   . LAPAROSCOPIC TUBAL LIGATION  12/09/2010   Procedure: LAPAROSCOPIC TUBAL LIGATION;  Surgeon: [name];  Location: [facility];  Service: Gynecology;  Laterality: Bilateral;  Attempted see Nursing note  . pleurocentesis  10/2004  . Pleuryx catheter placement  9/11    VanTright----removed 9/11  . RENAL BIOPSY  09/24/2012  . svd     x 3  . THORACENTESIS  2010   with penumonia  . WISDOM TOOTH EXTRACTION      No current facility-administered medications for this encounter.    Allergies as of 03/21/2019 - Review Complete 01/07/2019  Allergen Reaction Noted  . Lisinopril Cough 12/07/2012  . Lovenox [enoxaparin sodium] Itching 12/06/2012  . Moxifloxacin Other (See Comments) 04/17/2008  . Amoxicillin Other (See Comments) 06/09/2006    Family History  Problem Relation Age of Onset  . Lupus Sister   . Hypertension Father   . Hyperlipidemia Father   . Leukemia Sister   . Diabetes Other        GM    Social History   Socioeconomic History  . Marital status: Divorced    Spouse name: Not on file  . Number of children: 3  . Years of education: Not on file  . Highest education level: Not on file  Occupational History  . Occupation: Psychologist, educational at [employer]  . Smoking status: Former Smoker    Packs/day: 0.50    Years: 10.00    Pack years: 5.00    Types: Cigarettes    Quit date: 05/04/2009     Years since quitting: 9.9  . Smokeless tobacco: Never Used  . Tobacco comment: Socially x10 years (1 pack/month)  Substance and Sexual Activity  . Alcohol use: Yes    Alcohol/week: 0.0 standard drinks    Comment: rare  . Drug use: No  . Sexual activity: Not on file  Other Topics Concern  . Not on file  Social History Narrative  . Not on file   Social Determinants of Health   Financial Resource Strain:   . Difficulty of Paying Living Expenses: Not on file  Food Insecurity:   . Worried About Programme researcher, broadcasting/film/video in the Last Year: Not on file  . Ran Out of Food in the Last Year: Not on file  Transportation Needs:   . Lack of Transportation (Medical): Not on file  . Lack of Transportation (Non-Medical): Not on file  Physical Activity:   . Days of Exercise  per Week: Not on file  . Minutes of Exercise per Session: Not on file  Stress:   . Feeling of Stress : Not on file  Social Connections:   . Frequency of Communication with Friends and Family: Not on file  . Frequency of Social Gatherings with Friends and Family: Not on file  . Attends Religious Services: Not on file  . Active Member of Clubs or Organizations: Not on file  . Attends Banker Meetings: Not on file  . Marital Status: Not on file  Intimate Partner Violence:   . Fear of Current or Ex-Partner: Not on file  . Emotionally Abused: Not on file  . Physically Abused: Not on file  . Sexually Abused: Not on file     Physical Exam: General:   Alert,  well-nourished, pleasant and cooperative in NAD Head:  Normocephalic and atraumatic. Eyes:  Sclera clear, no icterus.   Conjunctiva pink. Ears:  Normal auditory acuity. Nose:  No deformity, discharge,  or lesions. Mouth:  No deformity or lesions.   Neck:  Supple; no masses or thyromegaly. Lungs:  Clear throughout to auscultation.   No wheezes. Heart:  Regular rate and rhythm; no murmurs. Abdomen:  Soft, obese, nontender, nondistended, normal bowel sounds,  no rebound or guarding. Well-healed laproscopic scars. No hepatosplenomegaly.   Rectal:  Deferred  Msk:  Symmetrical. No boney deformities LAD: No inguinal or umbilical LAD Extremities:  No clubbing or edema. Neurologic:  Alert and  oriented x4;  grossly nonfocal Skin:  Intact without significant lesions or rashes. Psych:  Alert and cooperative. Normal mood and affect.   Lab Results: No results for input(s): WBC, HGB, HCT, PLT in the last 72 hours. BMET No results for input(s): NA, K, CL, CO2, GLUCOSE, BUN, CREATININE, CALCIUM in the last 72 hours. LFT No results for input(s): PROT, ALBUMIN, AST, ALT, ALKPHOS, BILITOT, BILIDIR, IBILI in the last 72 hours.  Aaliayah Miao L. Orvan Falconer, MD, MPH 04/12/2019, 10:42 AM

## 2019-04-12 NOTE — Transfer of Care (Signed)
Immediate Anesthesia Transfer of Care Note  Patient: [name]  Procedure(s) Performed: Procedure(s): ESOPHAGOGASTRODUODENOSCOPY (EGD) WITH PROPOFOL (N/A)  Patient Location: PACU  Anesthesia Type:MAC  Level of Consciousness:  sedated, patient cooperative and responds to stimulation  Airway & Oxygen Therapy:Patient Spontanous Breathing and Patient connected to face mask oxgen  Post-op Assessment:  Report given to PACU RN and Post -op Vital signs reviewed and stable  Post vital signs:  Reviewed and stable  Last Vitals:  Vitals:   04/12/19 1103  BP: (!) 163/89  Pulse: 77  Resp: (!) 21  Temp: 36.7 C  SpO2: 60%    Complications: No apparent anesthesia complications

## 2019-04-12 NOTE — Anesthesia Preprocedure Evaluation (Addendum)
Anesthesia Evaluation  Patient identified by MRN, date of birth, ID band Patient awake    Reviewed: Allergy & Precautions, H&P , NPO status , Patient's Chart, lab work & pertinent test results, reviewed documented beta blocker date and time   History of Anesthesia Complications (+) DIFFICULT AIRWAY and history of anesthetic complications  Airway Mallampati: IV  TM Distance: >3 FB Neck ROM: Full    Dental  (+) Teeth Intact, Dental Advisory Given, Missing,    Pulmonary sleep apnea , former smoker,  Former smoker, quit 2011- 5 pack year history Hx pleural effusion a/w SLE- resolved OSA- has not used CPAP in a while, usually sleeps in recliner now to avoid GERD   Pulmonary exam normal breath sounds clear to auscultation       Cardiovascular hypertension, Pt. on medications and Pt. on home beta blockers  Rhythm:Regular Rate:Normal  Hx DVT 18yr ago, PE about 10 years ago- factor V leiden deficiency- has [place] IVC Filter, on lovenox bridge (normally on coumadin) HLD   Neuro/Psych PSYCHIATRIC DISORDERS Anxiety Anxiety/Panic Attacks with Agoraphobia negative neurological ROS     GI/Hepatic Neg liver ROS, GERD  Medicated and Controlled,S/P Gastric Sleeve Chronic N/V   Endo/Other  Hypothyroidism Morbid obesityHx/o Graves Disease, s/p RAI Possible Scarring of trachea Hyperlipidemia  Renal/GU Renal InsufficiencyRenal diseaseSLE with Renal involvement  negative genitourinary   Musculoskeletal  (+) Arthritis , SLE with Pulmonary and Renal involvement. On Plaqenil and prednisone for suppression   Abdominal (+) + obese,  Abdomen: soft.    Peds  Hematology Hx/o Factor V Leiden Deficiency, chronically on Coumadin for DVT's and hx/o PTE   Anesthesia Other Findings   Reproductive/Obstetrics negative OB ROS                            Anesthesia Physical Anesthesia Plan  ASA: III  Anesthesia Plan:  MAC   Post-op Pain Management:    Induction:   PONV Risk Score and Plan: 2 and Propofol infusion and TIVA  Airway Management Planned: Natural Airway and Simple Face Mask  Additional Equipment: None  Intra-op Plan:   Post-operative Plan:   Informed Consent: I have reviewed the patients History and Physical, chart, labs and discussed the procedure including the risks, benefits and alternatives for the proposed anesthesia with the patient or authorized representative who has indicated his/her understanding and acceptance.       Plan Discussed with: CRNA  Anesthesia Plan Comments: (Known difficult airway, will have glidescope available in room. )       Anesthesia Quick Evaluation

## 2019-04-12 NOTE — Discharge Instructions (Signed)
YOU HAD AN ENDOSCOPIC PROCEDURE TODAY: Refer to the procedure report and other information in the discharge instructions given to you for any specific questions about what was found during the examination. If this information does not answer your questions, please call [place] office at [phone] to clarify.   YOU SHOULD EXPECT: Some feelings of bloating in the abdomen. Passage of more gas than usual. Walking can help get rid of the air that was put into your GI tract during the procedure and reduce the bloating. If you had a lower endoscopy (such as a colonoscopy or flexible sigmoidoscopy) you may notice spotting of blood in your stool or on the toilet paper. Some abdominal soreness may be present for a day or two, also.  DIET: Your first meal following the procedure should be a light meal and then it is ok to progress to your normal diet. A half-sandwich or bowl of soup is an example of a good first meal. Heavy or fried foods are harder to digest and may make you feel nauseous or bloated. Drink plenty of fluids but you should avoid alcoholic beverages for 24 hours. If you had a esophageal dilation, please see attached instructions for diet.    ACTIVITY: Your care partner should take you home directly after the procedure. You should plan to take it easy, moving slowly for the rest of the day. You can resume normal activity the day after the procedure however YOU SHOULD NOT DRIVE, use power tools, machinery or perform tasks that involve climbing or major physical exertion for 24 hours (because of the sedation medicines used during the test).   SYMPTOMS TO REPORT IMMEDIATELY: A gastroenterologist can be reached at any hour. Please call [phone]  for any of the following symptoms:   Following upper endoscopy (EGD, EUS, ERCP, esophageal dilation) Vomiting of blood or coffee ground material  New, significant abdominal pain  New, significant chest pain or pain under the shoulder blades  Painful or  persistently difficult swallowing  New shortness of breath  Black, tarry-looking or red, bloody stools  FOLLOW UP:  If any biopsies were taken you will be contacted by phone or by letter within the next 1-3 weeks. Call [phone]  if you have not heard about the biopsies in 3 weeks.  Please also call with any specific questions about appointments or follow up tests.  

## 2019-04-13 ENCOUNTER — Encounter: Payer: Self-pay | Admitting: *Deleted

## 2019-04-13 ENCOUNTER — Other Ambulatory Visit: Payer: Self-pay

## 2019-04-13 LAB — SURGICAL PATHOLOGY

## 2019-04-19 ENCOUNTER — Telehealth: Payer: Self-pay | Admitting: Family Medicine

## 2019-04-19 ENCOUNTER — Other Ambulatory Visit: Payer: Self-pay

## 2019-04-19 ENCOUNTER — Ambulatory Visit (INDEPENDENT_AMBULATORY_CARE_PROVIDER_SITE_OTHER): Payer: 59 | Admitting: General Practice

## 2019-04-19 DIAGNOSIS — E538 Deficiency of other specified B group vitamins: Secondary | ICD-10-CM

## 2019-04-19 DIAGNOSIS — Z7901 Long term (current) use of anticoagulants: Secondary | ICD-10-CM

## 2019-04-19 LAB — POCT INR: INR: 2.6 (ref 2.0–3.0)

## 2019-04-19 MED ORDER — CYANOCOBALAMIN 1000 MCG/ML IJ SOLN
1000.0000 ug | Freq: Once | INTRAMUSCULAR | Status: AC
  Administered 2019-04-19: 1000 ug via INTRAMUSCULAR

## 2019-04-19 NOTE — Telephone Encounter (Signed)
Garrison Columbus, RN  Morene Cecilio, Audrie Gallus, MD  Hi Dr. Milinda Antis!   How are you?   I saw Amarrah this morning for her INR and she mentioned that she had a problem with itching and she thought hives when she took the Lovenox for the endoscopy. I thought you might want to notate that in her chart. I told her that maybe next time we could try Arixtra.   Thanks,  Bailey Mech, RN    Aware-added to her allergy list

## 2019-04-19 NOTE — Progress Notes (Signed)
Medical screening examination/treatment/procedure(s) were performed by non-physician practitioner and as supervising physician I was immediately available for consultation/collaboration. I agree with above. Raul Winterhalter, MD   

## 2019-04-19 NOTE — Telephone Encounter (Signed)
-----   Message from Garrison Columbus, RN sent at 04/19/2019 11:32 AM EDT ----- Regarding: sensivitity to Lovenox Hi Dr. Milinda Antis!  How are you?  I saw [name] this morning for her INR and she mentioned that she had a problem with itching and she thought hives when she took the Lovenox for the endoscopy.  I thought you might want to notate that in her chart.  I told her that maybe next time we could try Arixtra.  Thanks, Bailey Mech, RN

## 2019-04-19 NOTE — Patient Instructions (Signed)
Pre visit review using our clinic review tool, if applicable. No additional management support is needed unless otherwise documented below in the visit note. ° °Continue to take 1 1/2 tablets daily except 2 tablets on  Sundays.  Re-check in 4 weeks. ° °

## 2019-04-27 ENCOUNTER — Other Ambulatory Visit: Payer: Self-pay | Admitting: Family Medicine

## 2019-04-28 NOTE — Telephone Encounter (Signed)
The original prescription was discontinued on 04/06/2019 by Derwood Kaplan T, CPhT for the following reason: Dose change. Renewing this prescription may not be appropriate.

## 2019-05-03 ENCOUNTER — Other Ambulatory Visit: Payer: Self-pay | Admitting: *Deleted

## 2019-05-03 MED ORDER — LEVOTHYROXINE SODIUM 175 MCG PO TABS: 175.0000 ug | ORAL_TABLET | Freq: Every day | ORAL | 0 refills | Status: DC

## 2019-05-03 NOTE — Telephone Encounter (Signed)
See last TSH labs on 02/18/19, pt hasn't had f/u labs to make sure TSH is in normal range, no future appts also, FYI note on refill request says pt last filled med on 10/16/17  [facility] [address]

## 2019-05-03 NOTE — Telephone Encounter (Signed)
I refilled once Please call her to schedule a lab draw Thanks

## 2019-05-04 NOTE — Telephone Encounter (Signed)
I left a detailed message on patient's voice mail to call back and schedule non-fasting lab to check her thyroid.

## 2019-05-04 NOTE — Telephone Encounter (Signed)
[name] will reach out to pt to try and get appt scheduled lab appt

## 2019-05-12 ENCOUNTER — Encounter: Payer: Self-pay | Admitting: Gastroenterology

## 2019-05-12 ENCOUNTER — Ambulatory Visit (INDEPENDENT_AMBULATORY_CARE_PROVIDER_SITE_OTHER): Payer: 59 | Admitting: Gastroenterology

## 2019-05-12 VITALS — HR 73 | Temp 97.6°F | Ht 65.0 in | Wt 323.0 lb

## 2019-05-12 DIAGNOSIS — R112 Nausea with vomiting, unspecified: Secondary | ICD-10-CM

## 2019-05-12 DIAGNOSIS — E611 Iron deficiency: Secondary | ICD-10-CM | POA: Diagnosis not present

## 2019-05-12 DIAGNOSIS — Z9884 Bariatric surgery status: Secondary | ICD-10-CM

## 2019-05-12 NOTE — Patient Instructions (Addendum)
We have sent the following medications to your pharmacy for you to pick up at your convenience:  Omeprazole 40 mg twice a day   We will refer you to Dr Phylliss Blakes

## 2019-05-12 NOTE — Progress Notes (Signed)
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 I personally reviewed the images of her UGI series with the patient during her office visit today. Diverticula of the distal esophagus rarely require any treatment because most are asymptomatic.  However, the size of her diverticulum may be contributing to her ongoing symptoms. She is already on PPI.  Will increase to BID.  Would consider diverticulectomy or diverticulopexy. Surgical referral planned.  Nausea and vomiting may be due to esophageal diverticulum, as well as GERD and gastritis. Reflux may be exacerbated by recent weight gain.    Prior bariatric surgery: I recommended the patient resume daily supplementation to prevent post bariatric surgery nutritional deficiencies.  A single multivitamin may be used as long as it includes the following: Iron 45 mg daily, vitamin B12 500 mcg daily, folate 800 mcg daily, vitamin D 3000 international units daily, vitamin A 5000 international units daily, him in ED 15 mg daily, vitamin K 300 mcg daily, zinc 8 mg daily, copper 1 mg daily, selenium, magnesium and trace minerals that are contained in a multivitamin such as molybdenum, manganese, and chromium. A B complex vitamin of at least 50 mg should be used in addition to a multivitamin.   PLAN: -  Increase Omperazole to 40 mg BID - Avoid all NSAIDs - Reviewed GERD lifestyle modifications - Referral to Dr. Twana First to consider minimally invasive surgery of esophageal diverticulum if not responding to high dose PPI - Obtain prior colonoscopy report from [facility] - Obtain records from [facility] - Follow-up after her surgical consultation, earlier with new questions or concerns    HPI: [name] is a 54 y.o. female under evaluation for nausea and vomiting. The interval history is obtained through the patient and review of her electronic health record.  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 She has a history of DVT due to Factor V Leiden and is on warfarin through [facility].   Gastric sleeve performed at [facility] around 2012.  Had immediate post-op care but has not followed up with a surgeon or bariatric clinic since that time.  She takes a a vitabrand general multivitamin daily, b12 injection monthly, uses iron supplements. Has required iron infusions on a few occassions since her surgery.    Seen in consultation 01/07/19 for intermittent post-prandial nausea and vomiting that's been occurring since her gastric sleeve. Worse over the last 3 years such that she has needed to increase her medication doses.  Symptoms start 10-15 minutes after eating and are often relieved after vomiting undigested food. Associated bloating. No  identified food triggers.  No dysphagia, odynophagia, or heartburn.  No improvement with omeprazole OTC daily, Tums PRN, and rare PeptoBismal for additional relief.  Labs 01/04/19: Normal CMP, B12 705, hgb 11.4,  MCV 81.5, RDW 17.1, platelets 244, TSH 10.03, HgbA1c 5.7  Upper GI series 01/13/2019 showed a large portion diverticulum in the distal esophagus, marked stasis in the esophagus, and changes of sleeve gastrectomy.  EGD 04/12/2019 showed: - Dilated esophagus, most pronounced in the distal esophagus. - Esophageal mucosal abnormality - ? diverticulum. Biopsied. Biopsies also obtained from the mid and proximal esophagus.  All esophageal biopsies were normal. - A single hyperplastic gastric polyp.  Gastric biopsy showed mild reactive gastropathy and mild chronic gastritis.  There was no H. pylori. - Normal examined duodenum.  Biopsy showed prominent lymphoid aggregates. - No obvious source for iron deficiency identified. Await biopsy results.  I recommended that she increase her omeprazole. She has not yet picked up that prescription.  Today she reports that she continues to have postprandial nausea and vomiting, wakes up with coughing, intermittent bloating, regurgitation of food, "strange" upper abdominal cramps. She now wonders if this is all related to the diverticulum.   She has not yet started the higher dose of omeprazole.   Past Medical History:  Diagnosis Date  . Anxiety state, unspecified    panic attacks  . Arthritis    Lupus - hands/knees  . DVT (deep venous thrombosis) ([place])   . Edema   . 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 GERD (gastroesophageal reflux disease)    on prilosec r/t gastric sleeve surgery  . HLD (hyperlipidemia)   . Iron deficiency anemia    IV dextran [place]  . Nephritis and nephropathy, not specified as acute or chronic, with unspecified pathological lesion in kidney   . Nonspecific abnormal results of liver function study   . Obesity, unspecified   . Other pulmonary embolism and infarction   . Other specified acquired hypothyroidism   . Panic  disorder without agoraphobia   . Personal history of venous thrombosis and embolism 2002   during pregnancy; ?factor 5 leiden (sees heme)  . Pleural effusion 2005   c/w lupus initial w/u; recurrent Right as pred tapered off July 2011  . Pure hypercholesterolemia   . Sleep apnea   . 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 Unspecified essential hypertension   . Unspecified pleural effusion     Past Surgical History:  Procedure Laterality Date  . BIOPSY  04/12/2019   Procedure: BIOPSY;  Surgeon: Thornton Park, MD;  Location: [facility];  Service: Gastroenterology;;  . ESOPHAGOGASTRODUODENOSCOPY (EGD) WITH PROPOFOL N/A 04/12/2019   Procedure: ESOPHAGOGASTRODUODENOSCOPY (EGD) WITH PROPOFOL;  Surgeon: Thornton Park, MD;  Location: [facility];  Service: Gastroenterology;  Laterality: N/A;  . gastric sleeve  8/12   bariatric surgery Dr Evorn Gong   . LAPAROSCOPIC TUBAL LIGATION  12/09/2010   Procedure: LAPAROSCOPIC TUBAL LIGATION;  Surgeon: Darlyn Chamber;  Location: [facility];  Service: Gynecology;  Laterality: Bilateral;  Attempted see Nursing note  . pleurocentesis  10/2004  . Pleuryx catheter placement  9/11    VanTright----removed 9/11  . RENAL BIOPSY  09/24/2012  . svd     x 3  . THORACENTESIS  2010   with penumonia  . WISDOM TOOTH EXTRACTION  Current Outpatient Medications  Medication Sig Dispense Refill  . bismuth subsalicylate (PEPTO BISMOL) 262 MG/15ML suspension Take 30 mLs by mouth every 6 (six) hours as needed for indigestion or diarrhea or loose stools.    . calcium carbonate (TUMS - DOSED IN MG ELEMENTAL CALCIUM) 500 MG chewable tablet Chew 1-2 tablets by mouth 3 (three) times daily as needed for indigestion or heartburn.    . cyanocobalamin (,VITAMIN B-12,) 1000 MCG/ML injection Inject 1,000 mcg into the muscle every 30 (thirty) days.    Marland Kitchen enoxaparin (LOVENOX) 150 MG/ML injection Inject 1 mL (150 mg total) into the skin  every 12 (twelve) hours. 13 mL 0  . hydroxychloroquine (PLAQUENIL) 200 MG tablet Take 200 mg by mouth 2 (two) times daily.    Marland Kitchen labetalol (NORMODYNE) 200 MG tablet TAKE 1 AND 1/2 TABLETS BY MOUTH TWICE A DAY. (Patient taking differently: Take 300 mg by mouth in the morning and at bedtime. ) 90 tablet 11  . levothyroxine (SYNTHROID) 175 MCG tablet Take 1 tablet (175 mcg total) by mouth daily before breakfast. 30 tablet 0  . methocarbamol (ROBAXIN) 500 MG tablet Take 1 tablet (500 mg total) by mouth every 8 (eight) hours as needed for muscle spasms. Headache, caution of sedation 60 tablet 1  . Multiple Vitamins-Minerals (BARIATRIC MULTIVITAMINS/IRON) CAPS Take 1 tablet by mouth daily.    Marland Kitchen omeprazole (PRILOSEC) 20 MG capsule Take 20 mg by mouth 2 (two) times daily.    Marland Kitchen omeprazole (PRILOSEC) 40 MG capsule Take 1 capsule (40 mg total) by mouth 2 (two) times daily. (Patient not taking: Reported on 04/06/2019) 60 capsule 3  . PARoxetine (PAXIL) 40 MG tablet Take 1 tablet (40 mg total) by mouth every morning. (Patient taking differently: Take 40 mg by mouth daily. ) 90 tablet 3  . rosuvastatin (CRESTOR) 20 MG tablet Take 1 tablet (20 mg total) by mouth daily. (Patient taking differently: Take 20 mg by mouth every evening. ) 30 tablet 11  . warfarin (COUMADIN) 7.5 MG tablet TAKE 1 & 1/2 TABLETS DAILY EXCEPT 1 TABLET ON MON AND THURS OR AS DIRECTED BY CLINIC 90 DAY SUPPLY (Patient taking differently: Take 7.5-15 mg by mouth See admin instructions. Take 1.5 tablets (11.25 mg) by mouth on Mondays, Tuesdays, Wednesdays, Thursdays, Fridays, & Saturdays. Take 2 tablets (15 mg) by mouth on Sundays ONLY.) 145 tablet 0   Current Facility-Administered Medications  Medication Dose Route Frequency Provider Last Rate Last Admin  . cyanocobalamin ((VITAMIN B-12)) injection 1,000 mcg  1,000 mcg Intramuscular Q30 days Tower, Idamae Schuller A, MD   1,000 mcg at 12/17/18 1616  . cyanocobalamin ((VITAMIN B-12)) injection 1,000 mcg  1,000  mcg Intramuscular Once Corwin Levins, MD        Allergies as of 05/12/2019 - Review Complete 04/12/2019  Allergen Reaction Noted  . Lisinopril Cough 12/07/2012  . Lovenox [enoxaparin sodium] Itching 12/06/2012  . Moxifloxacin Other (See Comments) 04/17/2008  . Lovenox [enoxaparin]  04/19/2019    Family History  Problem Relation Age of Onset  . Lupus Sister   . Hypertension Father   . Hyperlipidemia Father   . Leukemia Sister   . Diabetes Other        GM    Social History   Socioeconomic History  . Marital status: Divorced    Spouse name: Not on file  . Number of children: 3  . Years of education: Not on file  . Highest education level: Not on file  Occupational History  .  Occupation: Psychologist, educational at [employer]  . Smoking status: Former Smoker    Packs/day: 0.50    Years: 10.00    Pack years: 5.00    Types: Cigarettes    Quit date: 05/04/2009    Years since quitting: 10.0  . Smokeless tobacco: Never Used  . Tobacco comment: Socially x10 years (1 pack/month)  Substance and Sexual Activity  . Alcohol use: Yes    Alcohol/week: 0.0 standard drinks    Comment: rare  . Drug use: No  . Sexual activity: Not on file  Other Topics Concern  . Not on file  Social History Narrative  . Not on file   Social Determinants of Health   Financial Resource Strain:   . Difficulty of Paying Living Expenses:   Food Insecurity:   . Worried About Programme researcher, broadcasting/film/video in the Last Year:   . Barista in the Last Year:   Transportation Needs:   . Freight forwarder (Medical):   Marland Kitchen Lack of Transportation (Non-Medical):   Physical Activity:   . Days of Exercise per Week:   . Minutes of Exercise per Session:   Stress:   . Feeling of Stress :   Social Connections:   . Frequency of Communication with Friends and Family:   . Frequency of Social Gatherings with Friends and Family:   . Attends Religious Services:   . Active Member of Clubs or Organizations:   .  Attends Banker Meetings:   Marland Kitchen Marital Status:   Intimate Partner Violence:   . Fear of Current or Ex-Partner:   . Emotionally Abused:   Marland Kitchen Physically Abused:   . Sexually Abused:     Physical Exam: General:   Alert,  well-nourished, pleasant and cooperative in NAD Head:  Normocephalic and atraumatic. Eyes:  Sclera clear, no icterus.   Conjunctiva pink. Abdomen:  Soft, obese, nontender, nondistended, normal bowel sounds, no rebound or guarding. Well-healed laproscopic scars. No hepatosplenomegaly.   Neurologic:  Alert and  oriented x4;  grossly nonfocal Skin:  Intact without significant lesions or rashes. Psych:  Alert and cooperative. Normal mood and affect.  [name] L. Orvan Falconer, MD, MPH 05/12/2019, 9:58 AM

## 2019-05-13 ENCOUNTER — Telehealth: Payer: Self-pay | Admitting: Gastroenterology

## 2019-05-13 MED ORDER — OMEPRAZOLE 20 MG PO CPDR: 20.0000 mg | DELAYED_RELEASE_CAPSULE | Freq: Two times a day (BID) | ORAL | 3 refills | Status: DC

## 2019-05-13 NOTE — Telephone Encounter (Signed)
Rx sent to pharmacy   

## 2019-05-20 ENCOUNTER — Telehealth (INDEPENDENT_AMBULATORY_CARE_PROVIDER_SITE_OTHER): Payer: 59 | Admitting: Family Medicine

## 2019-05-20 ENCOUNTER — Encounter: Payer: Self-pay | Admitting: Family Medicine

## 2019-05-20 VITALS — Temp 98.7°F

## 2019-05-20 DIAGNOSIS — M3213 Lung involvement in systemic lupus erythematosus: Secondary | ICD-10-CM

## 2019-05-20 DIAGNOSIS — E890 Postprocedural hypothyroidism: Secondary | ICD-10-CM | POA: Diagnosis not present

## 2019-05-20 DIAGNOSIS — R5383 Other fatigue: Secondary | ICD-10-CM | POA: Insufficient documentation

## 2019-05-20 DIAGNOSIS — R5382 Chronic fatigue, unspecified: Secondary | ICD-10-CM

## 2019-05-20 NOTE — Assessment & Plan Note (Signed)
Episodic fatigue and chills lasting 1/2 day   (three episodes all after covid vaccines (had covid vaccines starting 3/20) and episodes on 3/26, 4/5, and today 4/16 (already feeling better)  In setting of SLE and hypothyroidism with abn tsh in January  Planned labs for cbc (was recently anemic), cmet and tsh -will come in for those Continue to monitor symptoms (no fever so far)  Practice good self care Delayed imm rxn is in the diff along with SLE flares and thyroid related  Pend labs will adv further  inst to call if symptoms return/worsen/persist or new ones develop

## 2019-05-20 NOTE — Progress Notes (Signed)
Virtual Visit via Video Note  I connected with [name] on 05/20/19 at 10:45 AM EDT by a video enabled telemedicine application and verified that I am speaking with the correct person using two identifiers.  Location: Patient: home Provider: office   I discussed the limitations of evaluation and management by telemedicine and the availability of in person appointments. The patient expressed understanding and agreed to proceed.   Parties involved in encounter  Patient:[name]  Provider:  Roxy Manns MD    History of Present Illness: Pt presents with c/o chills /fatigue/headache/fever   Woke up yesterday and felt poorly  Drank coffee/took a shower (she was chilled)   Then more chills/headache  Got worse  Bundled up and turned on fireplace  Took temp -not fever (but felt like it)   Drank some pedialyte- sipped all day  Rested all day  Chills improved around lunchtime  She did take tylenol   Had vaccine #2 this weekend - very sore arm and neck and shoulders  A week before knees hurt  She got the pfizer   Difficult to focus vision wise with her headache   This whole scenerio happens about once a week  Always turns into the chills and headache   Usually lasts 1/2 day or longer   Episode 4/5 3/26 (first one)  Seems to be in a pattern  First pfizer vaccine for covid was 3/20   Needs thyroid check   Still seeing GI for esophageal problems /vomiting (she has appt at [facility]) and eating a bland diet   Still taking plaquenil (has eye visits every 6 mo)  No other medicines for lupus  Has been stable    Lab Results  Component Value Date   TSH 13.35 (H) 02/18/2019     Temp: 98.7 F (37.1 C)    Had neg covid test 3/5 No covid contacts   No urinary symptoms   Patient Active Problem List   Diagnosis Date Noted  . Fatigue 05/20/2019  . 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 Aortic stenosis 08/15/2017  .  Vitamin B12 deficiency 11/15/2015  . Nausea & vomiting 10/15/2015  . Allergic rhinitis 07/01/2015  . History of cerebral hemorrhage 07/01/2015  . Chronic daily headache 06/29/2015  . Screening for osteoporosis 11/04/2013  . Encounter for therapeutic drug monitoring 03/25/2013  . CKD (chronic kidney disease) stage 2, GFR 60-89 ml/min 12/20/2012  . 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 Routine general medical examination at a health care facility 12/07/2012  . Steroid long-term use 12/07/2012  . History of HPV infection 12/07/2012  . Long term (current) use of anticoagulants 10/06/2012  . Chronic anticoagulation 04/22/2012  . S/P bariatric surgery 04/22/2012  . Factor V Leiden (HCC) 04/22/2012  . OSA on CPAP 11/20/2011  . Prediabetes 09/16/2010  . EDEMA 01/08/2010  . PLEURAL EFFUSION 08/27/2009  . ENLARGEMENT OF LYMPH NODES 08/13/2009  . History of pulmonary embolism 05/24/2009  . Morbid obesity (HCC) 12/08/2007  . Hypothyroidism 06/09/2006  . HYPERCHOLESTEROLEMIA 06/09/2006  . Generalized anxiety disorder 06/09/2006  . PANIC DISORDER 06/09/2006  . Essential hypertension 06/09/2006  . GLOMERULONEPHRITIS 06/09/2006  . Systemic lupus erythematosus (HCC) 06/09/2006  . DVT, HX OF 06/09/2006   Past Medical History:  Diagnosis Date  . Anxiety state, unspecified    panic attacks  . Arthritis    Lupus - hands/knees  . DVT (deep venous thrombosis) (HCC)   . Edema   . Empyema without  mention of fistula    Loculated-chronic on Left-VanTright; s/p VATS 5/10  . 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 GERD (gastroesophageal reflux disease)    on prilosec r/t gastric sleeve surgery  . HLD (hyperlipidemia)   . Iron deficiency anemia    IV dextran [place]  . Nephritis and nephropathy, not specified as acute or chronic, with unspecified pathological lesion in kidney   . Nonspecific abnormal results of liver function study   . Obesity, unspecified   . Other pulmonary  embolism and infarction   . Other specified acquired hypothyroidism   . Panic disorder without agoraphobia   . Personal history of venous thrombosis and embolism 2002   during pregnancy; ?factor 5 leiden (sees heme)  . Pleural effusion 2005   c/w lupus initial w/u; recurrent Right as pred tapered off July 2011  . Pure hypercholesterolemia   . Sleep apnea   . 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 Unspecified essential hypertension   . Unspecified pleural effusion    Past Surgical History:  Procedure Laterality Date  . BIOPSY  04/12/2019   Procedure: BIOPSY;  Surgeon: Thornton Park, MD;  Location: [facility];  Service: Gastroenterology;;  . ESOPHAGOGASTRODUODENOSCOPY (EGD) WITH PROPOFOL N/A 04/12/2019   Procedure: ESOPHAGOGASTRODUODENOSCOPY (EGD) WITH PROPOFOL;  Surgeon: Thornton Park, MD;  Location: [facility];  Service: Gastroenterology;  Laterality: N/A;  . gastric sleeve  8/12   bariatric surgery Dr Evorn Gong   . LAPAROSCOPIC TUBAL LIGATION  12/09/2010   Procedure: LAPAROSCOPIC TUBAL LIGATION;  Surgeon: Darlyn Chamber;  Location: [facility];  Service: Gynecology;  Laterality: Bilateral;  Attempted see Nursing note  . pleurocentesis  10/2004  . Pleuryx catheter placement  9/11    VanTright----removed 9/11  . RENAL BIOPSY  09/24/2012  . svd     x 3  . THORACENTESIS  2010   with penumonia  . WISDOM TOOTH EXTRACTION     Social History   Tobacco Use  . Smoking status: Former Smoker    Packs/day: 0.50    Years: 10.00    Pack years: 5.00    Types: Cigarettes    Quit date: 05/04/2009    Years since quitting: 10.0  . Smokeless tobacco: Never Used  . Tobacco comment: Socially x10 years (1 pack/month)  Substance Use Topics  . Alcohol use: Yes    Alcohol/week: 0.0 standard drinks    Comment: rare  . Drug use: No   Family History  Problem Relation Age of Onset  . Lupus Sister   . Hypertension Father   . Hyperlipidemia Father    . Leukemia Sister   . Diabetes Other        GM   Allergies  Allergen Reactions  . Lisinopril Cough  . Lovenox [Enoxaparin Sodium] Itching  . Moxifloxacin Other (See Comments)    REACTION: hallucinations  . Lovenox [Enoxaparin]     Itching    Current Outpatient Medications on File Prior to Visit  Medication Sig Dispense Refill  . bismuth subsalicylate (PEPTO BISMOL) 262 MG/15ML suspension Take 30 mLs by mouth every 6 (six) hours as needed for indigestion or diarrhea or loose stools.    . calcium carbonate (TUMS - DOSED IN MG ELEMENTAL CALCIUM) 500 MG chewable tablet Chew 1-2 tablets by mouth 3 (three) times daily as needed for indigestion or heartburn.    . cyanocobalamin (,VITAMIN B-12,) 1000 MCG/ML injection Inject 1,000 mcg into  the muscle every 30 (thirty) days.    . hydroxychloroquine (PLAQUENIL) 200 MG tablet Take 200 mg by mouth 2 (two) times daily.    Marland Kitchen labetalol (NORMODYNE) 200 MG tablet TAKE 1 AND 1/2 TABLETS BY MOUTH TWICE A DAY. (Patient taking differently: Take 300 mg by mouth in the morning and at bedtime. ) 90 tablet 11  . levothyroxine (SYNTHROID) 175 MCG tablet Take 1 tablet (175 mcg total) by mouth daily before breakfast. 30 tablet 0  . methocarbamol (ROBAXIN) 500 MG tablet Take 1 tablet (500 mg total) by mouth every 8 (eight) hours as needed for muscle spasms. Headache, caution of sedation 60 tablet 1  . Multiple Vitamins-Minerals (BARIATRIC MULTIVITAMINS/IRON) CAPS Take 1 tablet by mouth daily.    Marland Kitchen omeprazole (PRILOSEC) 20 MG capsule Take 1 capsule (20 mg total) by mouth 2 (two) times daily before a meal. 60 capsule 3  . PARoxetine (PAXIL) 40 MG tablet Take 1 tablet (40 mg total) by mouth every morning. (Patient taking differently: Take 40 mg by mouth daily. ) 90 tablet 3  . rosuvastatin (CRESTOR) 20 MG tablet Take 1 tablet (20 mg total) by mouth daily. (Patient taking differently: Take 20 mg by mouth every evening. ) 30 tablet 11  . warfarin (COUMADIN) 7.5 MG tablet  TAKE 1 & 1/2 TABLETS DAILY EXCEPT 1 TABLET ON MON AND THURS OR AS DIRECTED BY CLINIC 90 DAY SUPPLY (Patient taking differently: Take 7.5-15 mg by mouth See admin instructions. Take 1.5 tablets (11.25 mg) by mouth on Mondays, Tuesdays, Wednesdays, Thursdays, Fridays, & Saturdays. Take 2 tablets (15 mg) by mouth on Sundays ONLY.) 145 tablet 0   Current Facility-Administered Medications on File Prior to Visit  Medication Dose Route Frequency Provider Last Rate Last Admin  . cyanocobalamin ((VITAMIN B-12)) injection 1,000 mcg  1,000 mcg Intramuscular Q30 days Debraann Livingstone, Idamae Schuller A, MD   1,000 mcg at 12/17/18 1616  . cyanocobalamin ((VITAMIN B-12)) injection 1,000 mcg  1,000 mcg Intramuscular Once Corwin Levins, MD        Review of Systems  Constitutional: Positive for chills. Negative for diaphoresis, fever, malaise/fatigue and weight loss.  HENT: Negative for congestion, ear pain, sinus pain and sore throat.   Eyes: Negative for blurred vision, discharge and redness.  Respiratory: Negative for cough, sputum production, shortness of breath, wheezing and stridor.   Cardiovascular: Negative for chest pain, palpitations and leg swelling.  Gastrointestinal: Positive for vomiting. Negative for abdominal pain, diarrhea and nausea.       Still having intermittent vomiting issues- has seen GI and set up to see a surgeon (past bariatric surery)  Genitourinary: Negative for dysuria and frequency.  Musculoskeletal: Negative for myalgias.  Skin: Negative for rash.  Neurological: Positive for headaches. Negative for dizziness.    Observations/Objective: Patient appears well, in no distress Weight is baseline (obese) No facial swelling or asymmetry Normal voice-not hoarse and no slurred speech No obvious tremor or mobility impairment Moving neck and UEs normally Able to hear the call well  No cough or shortness of breath during interview  Talkative and mentally sharp with no cognitive changes No skin changes on  face or neck , no rash or pallor Affect is normal (cheerful)   Assessment and Plan: Problem List Items Addressed This Visit      Respiratory   Systemic lupus erythematosus with lung involvement (HCC)    Pt has had recent episodes of fatigue and chills lasting about a 1/2 day  No rash or joint c/o  or sob  Continues plaquenil        Endocrine   Hypothyroidism - Primary    Lab Results  Component Value Date   TSH 13.35 (H) 02/18/2019    ? If related to episodic fatigue with chills  TSH ordered  Taking 175 mcg of levothyroxine        Relevant Orders   TSH     Other   Fatigue    Episodic fatigue and chills lasting 1/2 day   (three episodes all after covid vaccines (had covid vaccines starting 3/20) and episodes on 3/26, 4/5, and today 4/16 (already feeling better)  In setting of SLE and hypothyroidism with abn tsh in January  Planned labs for cbc (was recently anemic), cmet and tsh -will come in for those Continue to monitor symptoms (no fever so far)  Practice good self care Delayed imm rxn is in the diff along with SLE flares and thyroid related  Pend labs will adv further  inst to call if symptoms return/worsen/persist or new ones develop      Relevant Orders   Comprehensive metabolic panel   CBC with Differential/Platelet   TSH       Follow Up Instructions: I wonder if you could have a delayed vaccine reaction from the pfizer covid vaccine  It is reassuring that symptoms go away fairly quickly  A lupus flare is also something to consider  We also need to check your thyroid   Tylenol is ok if needed   Call and make a lab appt. -we will check your chemistries and cbc and TSH and let you know how results return   If symptoms worsen or change please let me know    I discussed the assessment and treatment plan with the patient. The patient was provided an opportunity to ask questions and all were answered. The patient agreed with the plan and demonstrated an  understanding of the instructions.   The patient was advised to call back or seek an in-person evaluation if the symptoms worsen or if the condition fails to improve as anticipated.  Roxy Manns, MD

## 2019-05-20 NOTE — Assessment & Plan Note (Signed)
Lab Results  Component Value Date   TSH 13.35 (H) 02/18/2019    ? If related to episodic fatigue with chills  TSH ordered  Taking 175 mcg of levothyroxine

## 2019-05-20 NOTE — Assessment & Plan Note (Signed)
Pt has had recent episodes of fatigue and chills lasting about a 1/2 day  No rash or joint c/o or sob  Continues plaquenil

## 2019-05-20 NOTE — Patient Instructions (Signed)
I wonder if you could have a delayed vaccine reaction from the pfizer covid vaccine  It is reassuring that symptoms go away fairly quickly  A lupus flare is also something to consider  We also need to check your thyroid   Tylenol is ok if needed   Call and make a lab appt. -we will check your chemistries and cbc and TSH and let you know how results return   If symptoms worsen or change please let me know

## 2019-05-24 ENCOUNTER — Other Ambulatory Visit: Payer: Self-pay

## 2019-05-24 ENCOUNTER — Other Ambulatory Visit: Payer: Self-pay | Admitting: General Practice

## 2019-05-24 ENCOUNTER — Other Ambulatory Visit (INDEPENDENT_AMBULATORY_CARE_PROVIDER_SITE_OTHER): Payer: 59 | Admitting: General Practice

## 2019-05-24 ENCOUNTER — Ambulatory Visit (INDEPENDENT_AMBULATORY_CARE_PROVIDER_SITE_OTHER): Payer: 59 | Admitting: General Practice

## 2019-05-24 ENCOUNTER — Ambulatory Visit: Payer: 59

## 2019-05-24 DIAGNOSIS — E538 Deficiency of other specified B group vitamins: Secondary | ICD-10-CM

## 2019-05-24 DIAGNOSIS — Z86711 Personal history of pulmonary embolism: Secondary | ICD-10-CM | POA: Diagnosis not present

## 2019-05-24 DIAGNOSIS — D6851 Activated protein C resistance: Secondary | ICD-10-CM

## 2019-05-24 DIAGNOSIS — Z7901 Long term (current) use of anticoagulants: Secondary | ICD-10-CM

## 2019-05-24 LAB — POCT INR: INR: 1.5 — AB (ref 2.0–3.0)

## 2019-05-24 MED ORDER — CYANOCOBALAMIN 1000 MCG/ML IJ SOLN
1000.0000 ug | Freq: Once | INTRAMUSCULAR | Status: AC
  Administered 2019-05-24: 16:00:00 1000 ug via INTRAMUSCULAR

## 2019-05-24 NOTE — Patient Instructions (Addendum)
Pre visit review using our clinic review tool, if applicable. No additional management support is needed unless otherwise documented below in the visit note.  Take 2 tablets today, tomorrow and Thursday.  On Friday continue to take 1 1/2 tablets daily except 2 tablets on  Sundays.  Re-check in 1 to 2 weeks.

## 2019-05-25 NOTE — Progress Notes (Signed)
Medical screening examination/treatment/procedure(s) were performed by non-physician practitioner and as supervising physician I was immediately available for consultation/collaboration. I agree with above. Garfield Coiner, MD   

## 2019-05-27 ENCOUNTER — Other Ambulatory Visit (INDEPENDENT_AMBULATORY_CARE_PROVIDER_SITE_OTHER): Payer: 59

## 2019-05-27 ENCOUNTER — Other Ambulatory Visit: Payer: Self-pay

## 2019-05-27 DIAGNOSIS — R5382 Chronic fatigue, unspecified: Secondary | ICD-10-CM

## 2019-05-27 DIAGNOSIS — E890 Postprocedural hypothyroidism: Secondary | ICD-10-CM | POA: Diagnosis not present

## 2019-05-27 LAB — CBC WITH DIFFERENTIAL/PLATELET
Basophils Absolute: 0.1 10*3/uL (ref 0.0–0.1)
Basophils Relative: 1.1 % (ref 0.0–3.0)
Eosinophils Absolute: 0.3 10*3/uL (ref 0.0–0.7)
Eosinophils Relative: 6.2 % — ABNORMAL HIGH (ref 0.0–5.0)
HCT: 34.2 % — ABNORMAL LOW (ref 36.0–46.0)
Hemoglobin: 10.8 g/dL — ABNORMAL LOW (ref 12.0–15.0)
Lymphocytes Relative: 25 % (ref 12.0–46.0)
Lymphs Abs: 1.1 10*3/uL (ref 0.7–4.0)
MCHC: 31.6 g/dL (ref 30.0–36.0)
MCV: 79.1 fl (ref 78.0–100.0)
Monocytes Absolute: 0.3 10*3/uL (ref 0.1–1.0)
Monocytes Relative: 7.6 % (ref 3.0–12.0)
Neutro Abs: 2.7 10*3/uL (ref 1.4–7.7)
Neutrophils Relative %: 60.1 % (ref 43.0–77.0)
Platelets: 231 10*3/uL (ref 150.0–400.0)
RBC: 4.32 Mil/uL (ref 3.87–5.11)
RDW: 17.1 % — ABNORMAL HIGH (ref 11.5–15.5)
WBC: 4.5 10*3/uL (ref 4.0–10.5)

## 2019-05-27 LAB — COMPREHENSIVE METABOLIC PANEL
ALT: 18 U/L (ref 0–35)
AST: 22 U/L (ref 0–37)
Albumin: 3.8 g/dL (ref 3.5–5.2)
Alkaline Phosphatase: 74 U/L (ref 39–117)
BUN: 13 mg/dL (ref 6–23)
CO2: 29 mEq/L (ref 19–32)
Calcium: 9.2 mg/dL (ref 8.4–10.5)
Chloride: 105 mEq/L (ref 96–112)
Creatinine, Ser: 1.02 mg/dL (ref 0.40–1.20)
GFR: 56.73 mL/min — ABNORMAL LOW (ref >60.00)
Glucose, Bld: 106 mg/dL — ABNORMAL HIGH (ref 70–99)
Potassium: 4.3 mEq/L (ref 3.5–5.1)
Sodium: 140 mEq/L (ref 135–145)
Total Bilirubin: 0.3 mg/dL (ref 0.2–1.2)
Total Protein: 7.2 g/dL (ref 6.0–8.3)

## 2019-05-27 LAB — TSH: TSH: 21.81 u[IU]/mL — ABNORMAL HIGH (ref 0.35–4.50)

## 2019-05-30 ENCOUNTER — Other Ambulatory Visit: Payer: Self-pay | Admitting: Family Medicine

## 2019-05-30 DIAGNOSIS — D509 Iron deficiency anemia, unspecified: Secondary | ICD-10-CM

## 2019-05-30 DIAGNOSIS — E890 Postprocedural hypothyroidism: Secondary | ICD-10-CM

## 2019-05-30 NOTE — Telephone Encounter (Signed)
-----   Message from Sherrie George, RN sent at 05/30/2019 12:48 PM EDT ----- Pt is not taking a specific iron supplement but is taking a multi-vitamin with iron in it. She did not have the bottle with her to know the dosage of iron. She did miss "a couple" doses of levothryroxine. She is unsure of how many doses. Pt is feeling better and denies any further episodes. Advised if anything changes to contact this office. Pt verbalized understanding.

## 2019-05-30 NOTE — Telephone Encounter (Signed)
I pended px for levothyroixine 200 mcg daily -inc from 175  Take daily in am/try not to miss doses and do not take with food or other medicines   Also niferex (iron) to take one daily with food   Please re check TSH, cbc and ferritin in 1 mo

## 2019-05-31 ENCOUNTER — Other Ambulatory Visit: Payer: Self-pay | Admitting: Family Medicine

## 2019-05-31 DIAGNOSIS — D509 Iron deficiency anemia, unspecified: Secondary | ICD-10-CM

## 2019-05-31 MED ORDER — LEVOTHYROXINE SODIUM 200 MCG PO TABS: 200.0000 ug | ORAL_TABLET | Freq: Every day | ORAL | 0 refills | Status: DC

## 2019-05-31 MED ORDER — POLYSACCHARIDE IRON COMPLEX 150 MG PO CAPS: 150.0000 mg | ORAL_CAPSULE | Freq: Every day | ORAL | 1 refills | Status: DC

## 2019-05-31 NOTE — Telephone Encounter (Signed)
Pt notified of med changes and labs ordered and scheduled.

## 2019-06-06 ENCOUNTER — Other Ambulatory Visit: Payer: Self-pay | Admitting: Family Medicine

## 2019-06-07 ENCOUNTER — Ambulatory Visit (INDEPENDENT_AMBULATORY_CARE_PROVIDER_SITE_OTHER): Payer: 59 | Admitting: General Practice

## 2019-06-07 ENCOUNTER — Other Ambulatory Visit: Payer: Self-pay

## 2019-06-07 DIAGNOSIS — Z7901 Long term (current) use of anticoagulants: Secondary | ICD-10-CM

## 2019-06-07 DIAGNOSIS — E538 Deficiency of other specified B group vitamins: Secondary | ICD-10-CM

## 2019-06-07 DIAGNOSIS — D6851 Activated protein C resistance: Secondary | ICD-10-CM | POA: Diagnosis not present

## 2019-06-07 DIAGNOSIS — Z86711 Personal history of pulmonary embolism: Secondary | ICD-10-CM | POA: Diagnosis not present

## 2019-06-07 LAB — POCT INR: INR: 2.6 (ref 2.0–3.0)

## 2019-06-07 MED ORDER — CYANOCOBALAMIN 1000 MCG/ML IJ SOLN
1000.0000 ug | Freq: Once | INTRAMUSCULAR | Status: AC
  Administered 2019-06-07: 1000 ug via INTRAMUSCULAR

## 2019-06-07 NOTE — Progress Notes (Signed)
Medical screening examination/treatment/procedure(s) were performed by non-physician practitioner and as supervising physician I was immediately available for consultation/collaboration. I agree with above. Carel Carrier, MD   

## 2019-06-07 NOTE — Patient Instructions (Signed)
Pre visit review using our clinic review tool, if applicable. No additional management support is needed unless otherwise documented below in the visit note. ° °Continue to take 1 1/2 tablets daily except 2 tablets on  Sundays.  Re-check in 4 weeks. ° °

## 2019-06-27 ENCOUNTER — Other Ambulatory Visit: Payer: Self-pay | Admitting: Family Medicine

## 2019-06-30 ENCOUNTER — Other Ambulatory Visit: Payer: 59

## 2019-07-05 ENCOUNTER — Other Ambulatory Visit (INDEPENDENT_AMBULATORY_CARE_PROVIDER_SITE_OTHER): Payer: 59

## 2019-07-05 ENCOUNTER — Ambulatory Visit: Payer: 59

## 2019-07-05 DIAGNOSIS — D509 Iron deficiency anemia, unspecified: Secondary | ICD-10-CM | POA: Diagnosis not present

## 2019-07-05 DIAGNOSIS — E890 Postprocedural hypothyroidism: Secondary | ICD-10-CM

## 2019-07-05 LAB — FERRITIN: Ferritin: 41.5 ng/mL (ref 10.0–291.0)

## 2019-07-05 LAB — CBC WITH DIFFERENTIAL/PLATELET
Basophils Absolute: 0 10*3/uL (ref 0.0–0.1)
Basophils Relative: 0.9 % (ref 0.0–3.0)
Eosinophils Absolute: 0.3 10*3/uL (ref 0.0–0.7)
Eosinophils Relative: 5.3 % — ABNORMAL HIGH (ref 0.0–5.0)
HCT: 34.4 % — ABNORMAL LOW (ref 36.0–46.0)
Hemoglobin: 10.7 g/dL — ABNORMAL LOW (ref 12.0–15.0)
Lymphocytes Relative: 24.9 % (ref 12.0–46.0)
Lymphs Abs: 1.2 10*3/uL (ref 0.7–4.0)
MCHC: 31.2 g/dL (ref 30.0–36.0)
MCV: 78.7 fl (ref 78.0–100.0)
Monocytes Absolute: 0.3 10*3/uL (ref 0.1–1.0)
Monocytes Relative: 6.9 % (ref 3.0–12.0)
Neutro Abs: 3 10*3/uL (ref 1.4–7.7)
Neutrophils Relative %: 62 % (ref 43.0–77.0)
Platelets: 168 10*3/uL (ref 150.0–400.0)
RBC: 4.37 Mil/uL (ref 3.87–5.11)
RDW: 17.4 % — ABNORMAL HIGH (ref 11.5–15.5)
WBC: 4.8 10*3/uL (ref 4.0–10.5)

## 2019-07-05 LAB — TSH: TSH: 0.46 u[IU]/mL (ref 0.35–4.50)

## 2019-07-11 ENCOUNTER — Other Ambulatory Visit: Payer: Self-pay

## 2019-07-11 ENCOUNTER — Telehealth: Payer: Self-pay

## 2019-07-11 DIAGNOSIS — Q396 Congenital diverticulum of esophagus: Secondary | ICD-10-CM

## 2019-07-11 DIAGNOSIS — Z1159 Encounter for screening for other viral diseases: Secondary | ICD-10-CM

## 2019-07-11 NOTE — Telephone Encounter (Signed)
Referral from Dr Doylene Canard for esophageal manometry. Dx esophageal diverticulum. Appointment 07/15/19 at 10:30 am. Covid screening 07/12/19 at 2:25 pm. Case number [id number] Called to the patient's home. No answer. Left a message to call back and ask for me.

## 2019-07-11 NOTE — Telephone Encounter (Signed)
Spoke with the patient. Reviewed her instruction for COVID testing and the manometry. Patient agrees to the plans.

## 2019-07-12 ENCOUNTER — Other Ambulatory Visit (HOSPITAL_COMMUNITY)
Admission: RE | Admit: 2019-07-12 | Discharge: 2019-07-12 | Disposition: A | Discharge status: Home / Self Care | Payer: 59 | Source: Ambulatory Visit | Attending: Gastroenterology | Admitting: Gastroenterology

## 2019-07-12 DIAGNOSIS — Z01812 Encounter for preprocedural laboratory examination: Secondary | ICD-10-CM | POA: Insufficient documentation

## 2019-07-12 DIAGNOSIS — Z20822 Contact with and (suspected) exposure to covid-19: Secondary | ICD-10-CM | POA: Diagnosis not present

## 2019-07-12 LAB — SARS CORONAVIRUS 2 (TAT 6-24 HRS): SARS Coronavirus 2: NEGATIVE

## 2019-07-15 ENCOUNTER — Encounter (HOSPITAL_COMMUNITY)
Admission: RE | Disposition: A | Discharge status: Home / Self Care | Payer: Self-pay | Source: Home / Self Care | Attending: Gastroenterology

## 2019-07-15 ENCOUNTER — Ambulatory Visit (HOSPITAL_COMMUNITY)
Admission: RE | Admit: 2019-07-15 | Discharge: 2019-07-15 | Disposition: A | Discharge status: Home / Self Care | Payer: 59 | Attending: Gastroenterology | Admitting: Gastroenterology

## 2019-07-15 DIAGNOSIS — K224 Dyskinesia of esophagus: Secondary | ICD-10-CM | POA: Diagnosis not present

## 2019-07-15 DIAGNOSIS — R1310 Dysphagia, unspecified: Secondary | ICD-10-CM | POA: Diagnosis not present

## 2019-07-15 DIAGNOSIS — Q396 Congenital diverticulum of esophagus: Secondary | ICD-10-CM

## 2019-07-15 DIAGNOSIS — R1319 Other dysphagia: Secondary | ICD-10-CM

## 2019-07-15 HISTORY — PX: ESOPHAGEAL MANOMETRY: SHX5429

## 2019-07-15 SURGERY — MANOMETRY, ESOPHAGUS
Anesthesia: Choice

## 2019-07-15 MED ORDER — LIDOCAINE VISCOUS HCL 2 % MT SOLN
OROMUCOSAL | Status: AC
  Filled 2019-07-15: qty 15

## 2019-07-15 SURGICAL SUPPLY — 2 items
FACESHIELD LNG OPTICON STERILE (SAFETY) IMPLANT
GLOVE BIO SURGEON STRL SZ8 (GLOVE) ×4 IMPLANT

## 2019-07-15 NOTE — Progress Notes (Signed)
Esophageal Manometry done per protocol. Pt tolerated well without complication or distress.  

## 2019-07-18 ENCOUNTER — Encounter (HOSPITAL_COMMUNITY): Payer: Self-pay | Admitting: Gastroenterology

## 2019-07-28 ENCOUNTER — Other Ambulatory Visit: Payer: Self-pay | Admitting: Family Medicine

## 2019-08-04 ENCOUNTER — Ambulatory Visit (INDEPENDENT_AMBULATORY_CARE_PROVIDER_SITE_OTHER): Payer: 59 | Admitting: General Practice

## 2019-08-04 ENCOUNTER — Other Ambulatory Visit: Payer: Self-pay

## 2019-08-04 ENCOUNTER — Other Ambulatory Visit (INDEPENDENT_AMBULATORY_CARE_PROVIDER_SITE_OTHER): Payer: 59 | Admitting: General Practice

## 2019-08-04 ENCOUNTER — Other Ambulatory Visit: Payer: Self-pay | Admitting: Family Medicine

## 2019-08-04 DIAGNOSIS — Z86711 Personal history of pulmonary embolism: Secondary | ICD-10-CM | POA: Diagnosis not present

## 2019-08-04 DIAGNOSIS — E538 Deficiency of other specified B group vitamins: Secondary | ICD-10-CM

## 2019-08-04 DIAGNOSIS — D6851 Activated protein C resistance: Secondary | ICD-10-CM | POA: Diagnosis not present

## 2019-08-04 DIAGNOSIS — Z7901 Long term (current) use of anticoagulants: Secondary | ICD-10-CM

## 2019-08-04 DIAGNOSIS — Q396 Congenital diverticulum of esophagus: Secondary | ICD-10-CM

## 2019-08-04 DIAGNOSIS — R1319 Other dysphagia: Secondary | ICD-10-CM

## 2019-08-04 LAB — POCT INR: INR: 2.4 (ref 2.0–3.0)

## 2019-08-04 MED ORDER — CYANOCOBALAMIN 1000 MCG/ML IJ SOLN
1000.0000 ug | Freq: Once | INTRAMUSCULAR | Status: AC
  Administered 2019-08-04: 1000 ug via INTRAMUSCULAR

## 2019-08-04 NOTE — Patient Instructions (Addendum)
Pre visit review using our clinic review tool, if applicable. No additional management support is needed unless otherwise documented below in the visit note.  Take 2 tablets today and then continue to take 1 1/2 tablets daily except 2 tablets on  Sundays.  Re-check in 4 weeks.

## 2019-08-04 NOTE — Progress Notes (Signed)
Medical screening examination/treatment/procedure(s) were performed by non-physician practitioner and as supervising physician I was immediately available for consultation/collaboration. I agree with above. Jaquelynn Wanamaker, MD   

## 2019-08-27 ENCOUNTER — Other Ambulatory Visit: Payer: Self-pay | Admitting: Family Medicine

## 2019-09-01 ENCOUNTER — Other Ambulatory Visit: Payer: Self-pay | Admitting: General Practice

## 2019-09-01 ENCOUNTER — Other Ambulatory Visit: Payer: Self-pay

## 2019-09-01 ENCOUNTER — Ambulatory Visit (INDEPENDENT_AMBULATORY_CARE_PROVIDER_SITE_OTHER): Payer: 59 | Admitting: General Practice

## 2019-09-01 DIAGNOSIS — E538 Deficiency of other specified B group vitamins: Secondary | ICD-10-CM

## 2019-09-01 DIAGNOSIS — Z7901 Long term (current) use of anticoagulants: Secondary | ICD-10-CM

## 2019-09-01 DIAGNOSIS — Z86711 Personal history of pulmonary embolism: Secondary | ICD-10-CM

## 2019-09-01 DIAGNOSIS — D6851 Activated protein C resistance: Secondary | ICD-10-CM | POA: Diagnosis not present

## 2019-09-01 LAB — POCT INR: INR: 2.7 (ref 2.0–3.0)

## 2019-09-01 MED ORDER — CYANOCOBALAMIN 1000 MCG/ML IJ SOLN
1000.0000 ug | Freq: Once | INTRAMUSCULAR | Status: AC
  Administered 2019-09-01: 1000 ug via INTRAMUSCULAR

## 2019-09-01 NOTE — Progress Notes (Signed)
Medical screening examination/treatment/procedure(s) were performed by non-physician practitioner and as supervising physician I was immediately available for consultation/collaboration. I agree with above. Cookie Pore, MD   

## 2019-09-01 NOTE — Patient Instructions (Signed)
Pre visit review using our clinic review tool, if applicable. No additional management support is needed unless otherwise documented below in the visit note.  Continue to take 1 1/2 tablets daily except 2 tablets on  Sundays.  Re-check in 4 weeks.

## 2019-10-06 ENCOUNTER — Ambulatory Visit (INDEPENDENT_AMBULATORY_CARE_PROVIDER_SITE_OTHER): Payer: 59 | Admitting: General Practice

## 2019-10-06 ENCOUNTER — Other Ambulatory Visit (INDEPENDENT_AMBULATORY_CARE_PROVIDER_SITE_OTHER): Payer: 59 | Admitting: General Practice

## 2019-10-06 ENCOUNTER — Other Ambulatory Visit: Payer: Self-pay

## 2019-10-06 DIAGNOSIS — Z86711 Personal history of pulmonary embolism: Secondary | ICD-10-CM

## 2019-10-06 DIAGNOSIS — D6851 Activated protein C resistance: Secondary | ICD-10-CM | POA: Diagnosis not present

## 2019-10-06 DIAGNOSIS — E538 Deficiency of other specified B group vitamins: Secondary | ICD-10-CM

## 2019-10-06 DIAGNOSIS — Z7901 Long term (current) use of anticoagulants: Secondary | ICD-10-CM | POA: Diagnosis not present

## 2019-10-06 LAB — POCT INR: INR: 2.9 (ref 2.0–3.0)

## 2019-10-06 MED ORDER — CYANOCOBALAMIN 1000 MCG/ML IJ SOLN
1000.0000 ug | Freq: Once | INTRAMUSCULAR | Status: AC
  Administered 2019-10-06: 1000 ug via INTRAMUSCULAR

## 2019-10-06 NOTE — Progress Notes (Signed)
Medical screening examination/treatment/procedure(s) were performed by non-physician practitioner and as supervising physician I was immediately available for consultation/collaboration. I agree with above. Torry Adamczak, MD   

## 2019-10-06 NOTE — Patient Instructions (Signed)
Pre visit review using our clinic review tool, if applicable. No additional management support is needed unless otherwise documented below in the visit note.  Continue to take 1 1/2 tablets daily except 2 tablets on  Sundays.  Re-check in 6 weeks.

## 2019-10-22 ENCOUNTER — Other Ambulatory Visit: Payer: Self-pay | Admitting: Gastroenterology

## 2019-11-08 ENCOUNTER — Telehealth: Payer: Self-pay

## 2019-11-08 NOTE — Telephone Encounter (Signed)
-----   Message from Tressia Danas, MD sent at 10/30/2019  9:32 PM EDT ----- Please offer this patient follow-up with me or APP if she has ongoing symptoms. She has not been seen since her esophageal manometry. Thanks.

## 2019-11-08 NOTE — Telephone Encounter (Signed)
Spoke with pt and she states she is still having symptoms. Pt scheduled to see Dr. Orvan Falconer 12/09/19@1 :30pm, pt aware of appt.

## 2019-11-17 ENCOUNTER — Other Ambulatory Visit: Payer: Self-pay

## 2019-11-17 ENCOUNTER — Ambulatory Visit (INDEPENDENT_AMBULATORY_CARE_PROVIDER_SITE_OTHER): Payer: 59 | Admitting: General Practice

## 2019-11-17 DIAGNOSIS — Z7901 Long term (current) use of anticoagulants: Secondary | ICD-10-CM

## 2019-11-17 DIAGNOSIS — E538 Deficiency of other specified B group vitamins: Secondary | ICD-10-CM

## 2019-11-17 DIAGNOSIS — D6851 Activated protein C resistance: Secondary | ICD-10-CM | POA: Diagnosis not present

## 2019-11-17 DIAGNOSIS — Z86711 Personal history of pulmonary embolism: Secondary | ICD-10-CM

## 2019-11-17 LAB — POCT INR: INR: 3 (ref 2.0–3.0)

## 2019-11-17 MED ORDER — CYANOCOBALAMIN 1000 MCG/ML IJ SOLN: 1000.0000 ug | Freq: Once | INTRAMUSCULAR | Status: DC

## 2019-11-17 NOTE — Progress Notes (Signed)
Patient ID: [name], female   DOB: Sep 23, 1965, 54 y.o.   MRN: [id number] Medical screening examination/treatment/procedure(s) were performed by non-physician practitioner and as supervising physician I was immediately available for consultation/collaboration. I agree with above. Oliver Barre, MD

## 2019-11-17 NOTE — Progress Notes (Signed)
cyan 

## 2019-11-17 NOTE — Patient Instructions (Addendum)
Pre visit review using our clinic review tool, if applicable. No additional management support is needed unless otherwise documented below in the visit note.  Continue to take 1 1/2 tablets daily except 2 tablets on  Sundays.  Re-check in 6 weeks.

## 2019-12-09 ENCOUNTER — Ambulatory Visit: Payer: 59 | Admitting: Gastroenterology

## 2019-12-22 ENCOUNTER — Other Ambulatory Visit: Payer: Self-pay

## 2019-12-22 ENCOUNTER — Ambulatory Visit (INDEPENDENT_AMBULATORY_CARE_PROVIDER_SITE_OTHER): Payer: 59 | Admitting: General Practice

## 2019-12-22 DIAGNOSIS — E538 Deficiency of other specified B group vitamins: Secondary | ICD-10-CM

## 2019-12-22 DIAGNOSIS — Z7901 Long term (current) use of anticoagulants: Secondary | ICD-10-CM | POA: Diagnosis not present

## 2019-12-22 DIAGNOSIS — D6851 Activated protein C resistance: Secondary | ICD-10-CM | POA: Diagnosis not present

## 2019-12-22 DIAGNOSIS — Z86711 Personal history of pulmonary embolism: Secondary | ICD-10-CM

## 2019-12-22 LAB — POCT INR: INR: 2.2 (ref 2.0–3.0)

## 2019-12-22 NOTE — Patient Instructions (Addendum)
Pre visit review using our clinic review tool, if applicable. No additional management support is needed unless otherwise documented below in the visit note.  Take 2 tablets today and then continue to take 1 1/2 tablets daily except 2 tablets on  Sundays.  Re-check in 6 weeks.

## 2019-12-22 NOTE — Progress Notes (Signed)
Medical screening examination/treatment/procedure(s) were performed by non-physician practitioner and as supervising physician I was immediately available for consultation/collaboration. I agree with above. Kelon Easom, MD   

## 2020-01-17 ENCOUNTER — Emergency Department (HOSPITAL_COMMUNITY): Payer: 59

## 2020-01-17 ENCOUNTER — Telehealth: Payer: Self-pay | Admitting: Internal Medicine

## 2020-01-17 ENCOUNTER — Other Ambulatory Visit: Payer: Self-pay

## 2020-01-17 ENCOUNTER — Encounter (HOSPITAL_COMMUNITY): Payer: Self-pay | Admitting: Emergency Medicine

## 2020-01-17 ENCOUNTER — Inpatient Hospital Stay (HOSPITAL_COMMUNITY)
Admission: EM | Admit: 2020-01-17 | Discharge: 2020-01-20 | DRG: 286 | Disposition: A | Discharge status: Home / Self Care | Payer: 59 | Attending: Internal Medicine | Admitting: Internal Medicine

## 2020-01-17 ENCOUNTER — Inpatient Hospital Stay (HOSPITAL_COMMUNITY): Payer: 59

## 2020-01-17 DIAGNOSIS — Z6841 Body Mass Index (BMI) 40.0 and over, adult: Secondary | ICD-10-CM | POA: Diagnosis not present

## 2020-01-17 DIAGNOSIS — I160 Hypertensive urgency: Secondary | ICD-10-CM | POA: Diagnosis not present

## 2020-01-17 DIAGNOSIS — I350 Nonrheumatic aortic (valve) stenosis: Secondary | ICD-10-CM

## 2020-01-17 DIAGNOSIS — Z8249 Family history of ischemic heart disease and other diseases of the circulatory system: Secondary | ICD-10-CM

## 2020-01-17 DIAGNOSIS — E785 Hyperlipidemia, unspecified: Secondary | ICD-10-CM | POA: Diagnosis present

## 2020-01-17 DIAGNOSIS — Z9884 Bariatric surgery status: Secondary | ICD-10-CM

## 2020-01-17 DIAGNOSIS — I10 Essential (primary) hypertension: Secondary | ICD-10-CM | POA: Diagnosis present

## 2020-01-17 DIAGNOSIS — G4733 Obstructive sleep apnea (adult) (pediatric): Secondary | ICD-10-CM | POA: Diagnosis present

## 2020-01-17 DIAGNOSIS — N189 Chronic kidney disease, unspecified: Secondary | ICD-10-CM | POA: Diagnosis present

## 2020-01-17 DIAGNOSIS — R079 Chest pain, unspecified: Secondary | ICD-10-CM

## 2020-01-17 DIAGNOSIS — Z83438 Family history of other disorder of lipoprotein metabolism and other lipidemia: Secondary | ICD-10-CM

## 2020-01-17 DIAGNOSIS — E039 Hypothyroidism, unspecified: Secondary | ICD-10-CM | POA: Diagnosis present

## 2020-01-17 DIAGNOSIS — Z832 Family history of diseases of the blood and blood-forming organs and certain disorders involving the immune mechanism: Secondary | ICD-10-CM | POA: Diagnosis not present

## 2020-01-17 DIAGNOSIS — Z806 Family history of leukemia: Secondary | ICD-10-CM | POA: Diagnosis not present

## 2020-01-17 DIAGNOSIS — Z7901 Long term (current) use of anticoagulants: Secondary | ICD-10-CM | POA: Diagnosis not present

## 2020-01-17 DIAGNOSIS — K219 Gastro-esophageal reflux disease without esophagitis: Secondary | ICD-10-CM | POA: Diagnosis present

## 2020-01-17 DIAGNOSIS — Z86718 Personal history of other venous thrombosis and embolism: Secondary | ICD-10-CM | POA: Diagnosis not present

## 2020-01-17 DIAGNOSIS — Z86711 Personal history of pulmonary embolism: Secondary | ICD-10-CM | POA: Diagnosis not present

## 2020-01-17 DIAGNOSIS — Z87891 Personal history of nicotine dependence: Secondary | ICD-10-CM | POA: Diagnosis not present

## 2020-01-17 DIAGNOSIS — D6851 Activated protein C resistance: Secondary | ICD-10-CM | POA: Diagnosis present

## 2020-01-17 DIAGNOSIS — Z20822 Contact with and (suspected) exposure to covid-19: Secondary | ICD-10-CM | POA: Diagnosis present

## 2020-01-17 DIAGNOSIS — Z79890 Hormone replacement therapy (postmenopausal): Secondary | ICD-10-CM

## 2020-01-17 DIAGNOSIS — E7800 Pure hypercholesterolemia, unspecified: Secondary | ICD-10-CM | POA: Diagnosis present

## 2020-01-17 DIAGNOSIS — J869 Pyothorax without fistula: Secondary | ICD-10-CM | POA: Diagnosis present

## 2020-01-17 DIAGNOSIS — M329 Systemic lupus erythematosus, unspecified: Secondary | ICD-10-CM | POA: Diagnosis present

## 2020-01-17 DIAGNOSIS — Z79899 Other long term (current) drug therapy: Secondary | ICD-10-CM

## 2020-01-17 DIAGNOSIS — Z9989 Dependence on other enabling machines and devices: Secondary | ICD-10-CM | POA: Diagnosis not present

## 2020-01-17 LAB — RESP PANEL BY RT-PCR (FLU A&B, COVID) ARPGX2
Influenza A by PCR: NEGATIVE
Influenza B by PCR: NEGATIVE
SARS Coronavirus 2 by RT PCR: NEGATIVE

## 2020-01-17 LAB — BASIC METABOLIC PANEL
Anion gap: 14 (ref 5–15)
BUN: 12 mg/dL (ref 6–20)
CO2: 24 mmol/L (ref 22–32)
Calcium: 9.9 mg/dL (ref 8.9–10.3)
Chloride: 104 mmol/L (ref 98–111)
Creatinine, Ser: 1.03 mg/dL — ABNORMAL HIGH (ref 0.44–1.00)
GFR, Estimated: >60 mL/min (ref >60)
Glucose, Bld: 95 mg/dL (ref 70–99)
Potassium: 4.6 mmol/L (ref 3.5–5.1)
Sodium: 142 mmol/L (ref 135–145)

## 2020-01-17 LAB — CBC
HCT: 38.6 % (ref 36.0–46.0)
Hemoglobin: 11 g/dL — ABNORMAL LOW (ref 12.0–15.0)
MCH: 23.3 pg — ABNORMAL LOW (ref 26.0–34.0)
MCHC: 28.5 g/dL — ABNORMAL LOW (ref 30.0–36.0)
MCV: 81.6 fL (ref 80.0–100.0)
Platelets: 209 10*3/uL (ref 150–400)
RBC: 4.73 MIL/uL (ref 3.87–5.11)
RDW: 16 % — ABNORMAL HIGH (ref 11.5–15.5)
WBC: 4.1 10*3/uL (ref 4.0–10.5)
nRBC: 0 % (ref 0.0–0.2)

## 2020-01-17 LAB — ECHOCARDIOGRAM LIMITED
AR max vel: 0.79 cm2
AV Area VTI: 0.9 cm2
AV Mean grad: 46 mmHg
AV Peak grad: 71.6 mmHg
Ao pk vel: 4.23 m/s
S' Lateral: 2.9 cm

## 2020-01-17 LAB — TROPONIN I (HIGH SENSITIVITY)
Troponin I (High Sensitivity): 7 ng/L (ref <18)
Troponin I (High Sensitivity): 6 ng/L (ref <18)

## 2020-01-17 LAB — I-STAT BETA HCG BLOOD, ED (MC, WL, AP ONLY): I-stat hCG, quantitative: 5.3 m[IU]/mL — ABNORMAL HIGH (ref <5)

## 2020-01-17 LAB — MRSA PCR SCREENING: MRSA by PCR: NEGATIVE

## 2020-01-17 LAB — PROTIME-INR
INR: 1.3 — ABNORMAL HIGH (ref 0.8–1.2)
Prothrombin Time: 15.4 seconds — ABNORMAL HIGH (ref 11.4–15.2)

## 2020-01-17 MED ORDER — HEPARIN BOLUS VIA INFUSION
5500.0000 [IU] | Freq: Once | INTRAVENOUS | Status: AC
  Administered 2020-01-17: 5500 [IU] via INTRAVENOUS
  Filled 2020-01-17: qty 5500

## 2020-01-17 MED ORDER — IOHEXOL 300 MG/ML  SOLN
75.0000 mL | Freq: Once | INTRAMUSCULAR | Status: AC | PRN
  Administered 2020-01-17: 75 mL via INTRAVENOUS

## 2020-01-17 MED ORDER — WARFARIN SODIUM 7.5 MG PO TABS
17.5000 mg | ORAL_TABLET | Freq: Once | ORAL | Status: AC
  Administered 2020-01-17: 17.5 mg via ORAL
  Filled 2020-01-17: qty 1

## 2020-01-17 MED ORDER — HEPARIN (PORCINE) 25000 UT/250ML-% IV SOLN
1650.0000 [IU]/h | INTRAVENOUS | Status: DC
  Administered 2020-01-17 – 2020-01-18 (×2): 1350 [IU]/h via INTRAVENOUS
  Administered 2020-01-19: 1650 [IU]/h via INTRAVENOUS
  Filled 2020-01-17 (×3): qty 250

## 2020-01-17 MED ORDER — ROSUVASTATIN CALCIUM 20 MG PO TABS
20.0000 mg | ORAL_TABLET | Freq: Every evening | ORAL | Status: DC
  Administered 2020-01-17 – 2020-01-19 (×3): 20 mg via ORAL
  Filled 2020-01-17 (×3): qty 1

## 2020-01-17 MED ORDER — HYDRALAZINE HCL 20 MG/ML IJ SOLN: 5.0000 mg | INTRAMUSCULAR | Status: DC | PRN

## 2020-01-17 MED ORDER — HYDROCODONE-ACETAMINOPHEN 5-325 MG PO TABS: 1.0000 | ORAL_TABLET | ORAL | Status: DC | PRN

## 2020-01-17 MED ORDER — BISACODYL 5 MG PO TBEC: 5.0000 mg | DELAYED_RELEASE_TABLET | Freq: Every day | ORAL | Status: DC | PRN

## 2020-01-17 MED ORDER — HYDROXYCHLOROQUINE SULFATE 200 MG PO TABS
200.0000 mg | ORAL_TABLET | Freq: Two times a day (BID) | ORAL | Status: DC
  Administered 2020-01-17 – 2020-01-20 (×6): 200 mg via ORAL
  Filled 2020-01-17 (×7): qty 1

## 2020-01-17 MED ORDER — PAROXETINE HCL 20 MG PO TABS
40.0000 mg | ORAL_TABLET | Freq: Every morning | ORAL | Status: DC
  Administered 2020-01-18 – 2020-01-20 (×3): 40 mg via ORAL
  Filled 2020-01-17 (×3): qty 2

## 2020-01-17 MED ORDER — DOCUSATE SODIUM 100 MG PO CAPS
100.0000 mg | ORAL_CAPSULE | Freq: Two times a day (BID) | ORAL | Status: DC
  Administered 2020-01-17 – 2020-01-20 (×6): 100 mg via ORAL
  Filled 2020-01-17 (×6): qty 1

## 2020-01-17 MED ORDER — WARFARIN - PHARMACIST DOSING INPATIENT: Freq: Every day | Status: DC

## 2020-01-17 MED ORDER — MORPHINE SULFATE (PF) 2 MG/ML IV SOLN
2.0000 mg | INTRAVENOUS | Status: DC | PRN
Start: 2020-01-17 — End: 2020-01-19

## 2020-01-17 MED ORDER — ACETAMINOPHEN 650 MG RE SUPP: 650.0000 mg | Freq: Four times a day (QID) | RECTAL | Status: DC | PRN

## 2020-01-17 MED ORDER — POLYETHYLENE GLYCOL 3350 17 G PO PACK: 17.0000 g | PACK | Freq: Every day | ORAL | Status: DC | PRN

## 2020-01-17 MED ORDER — LEVOTHYROXINE SODIUM 100 MCG PO TABS
200.0000 ug | ORAL_TABLET | Freq: Every day | ORAL | Status: DC
  Administered 2020-01-18 – 2020-01-20 (×3): 200 ug via ORAL
  Filled 2020-01-17 (×3): qty 2

## 2020-01-17 MED ORDER — ONDANSETRON HCL 4 MG PO TABS: 4.0000 mg | ORAL_TABLET | Freq: Four times a day (QID) | ORAL | Status: DC | PRN

## 2020-01-17 MED ORDER — ACETAMINOPHEN 325 MG PO TABS
650.0000 mg | ORAL_TABLET | Freq: Four times a day (QID) | ORAL | Status: DC | PRN
  Administered 2020-01-19: 650 mg via ORAL
  Filled 2020-01-17: qty 2

## 2020-01-17 MED ORDER — LACTATED RINGERS IV SOLN: INTRAVENOUS | Status: DC

## 2020-01-17 MED ORDER — LABETALOL HCL 200 MG PO TABS
300.0000 mg | ORAL_TABLET | Freq: Two times a day (BID) | ORAL | Status: DC
  Administered 2020-01-17 – 2020-01-20 (×6): 300 mg via ORAL
  Filled 2020-01-17: qty 1
  Filled 2020-01-17: qty 2
  Filled 2020-01-17 (×3): qty 1
  Filled 2020-01-17: qty 2
  Filled 2020-01-17: qty 1

## 2020-01-17 MED ORDER — PANTOPRAZOLE SODIUM 40 MG PO TBEC
40.0000 mg | DELAYED_RELEASE_TABLET | Freq: Two times a day (BID) | ORAL | Status: DC
  Administered 2020-01-17 – 2020-01-20 (×6): 40 mg via ORAL
  Filled 2020-01-17 (×6): qty 1

## 2020-01-17 MED ORDER — SODIUM CHLORIDE 0.9% FLUSH
3.0000 mL | Freq: Two times a day (BID) | INTRAVENOUS | Status: DC
  Administered 2020-01-18 – 2020-01-20 (×2): 3 mL via INTRAVENOUS

## 2020-01-17 MED ORDER — ONDANSETRON HCL 4 MG/2ML IJ SOLN: 4.0000 mg | Freq: Four times a day (QID) | INTRAMUSCULAR | Status: DC | PRN

## 2020-01-17 NOTE — H&P (Signed)
History and Physical    Cheryl DenverLori A Yuille ZOX:[id number] DOB: 06/21/1965 DOA: 01/17/2020  PCP: Judy Pimpleower, Marne A, MD Consultants:  Tenny Crawoss - cardiology; Beavers - GI; Delford FieldWright - pulmonology; Coladonato - nephrology; Kellie Simmeringruslow - rheumatology; Cyndie ChimeGranfortuna - heme/onc Patient coming from:  Home - lives with daughter; [place]: Mother, Fontaine NoSheila Jerger, [phone]  Chief Complaint:  Chest pain  HPI: [name] is a 54 y.o. female with medical history significant of HTN; HLE; OSA; HLD;  VTE; panic d/o; hypothyroidism; morbid obesity presenting with chest pain.  This AM, she awoke to L-sided chest/breast pain.  It was consistent.  Her arm felt different, somewhat heavy.  The pain would feel like a twinge, lasting a minute or 2 and then dissipate but would come back every 5-7 minutes and kept bothering her.  Not exertional, just on a consistent pattern.  She is still having the discomfort.  She does have occasional SOB over the last week, causing her to modify activity.  +occasional cough.  She was having post-prandial emesis for the last year and has a mild hiatal hernia.  No fevers.  No LAD, night sweats.  +fatigue, chronic.  She feels like her lupus has been pretty well controlled, no recent flares other than fatigue from overexertion.  She had repeat thoracentesis with eventual VATS/thoracotomy with talc 8-10 years ago with similar presentation, empyema.  At that time, she was having significant exertional dyspnea.   Also with pericardial effusion, none in 10+ years.    ED Course:   Lupus pt on Plaquenil, intermittent CP. Herby Abraham. Xray with probable large empyema.  Has remote h/o empyema, ?related to SLE.  CT pending.  Likely needs admission regardless.   Review of Systems: As per HPI; otherwise review of systems reviewed and negative.   Ambulatory Status:  Ambulates without assistance  COVID Vaccine Status:   Complete plus booster  Past Medical History:  Diagnosis Date  . Arthritis    Lupus - hands/knees  .  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 GERD (gastroesophageal reflux disease)    on prilosec r/t gastric sleeve surgery  . HLD (hyperlipidemia)   . Iron deficiency anemia    IV dextran [place]  . Nephritis and nephropathy, not specified as acute or chronic, with unspecified pathological lesion in kidney   . Nonspecific abnormal results of liver function study   . Obesity, unspecified   . Other specified acquired hypothyroidism   . Panic disorder without agoraphobia   . Personal history of venous thrombosis and embolism 2002   during pregnancy; ?factor 5 leiden (sees heme)  . Pleural effusion 2005   c/w lupus initial w/u; recurrent Right as pred tapered off July 2011  . Sleep apnea   . 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 Unspecified essential hypertension     Past Surgical History:  Procedure Laterality Date  . BIOPSY  04/12/2019   Procedure: BIOPSY;  Surgeon: Tressia DanasBeavers, Kimberly, MD;  [facility];  Service: Gastroenterology;;  . ESOPHAGEAL MANOMETRY N/A 07/15/2019   Procedure: ESOPHAGEAL MANOMETRY (EM);  Surgeon: Napoleon FormNandigam, Kavitha V, MD;  Location: [facility];  Service: Endoscopy;  Laterality: N/A;  . ESOPHAGOGASTRODUODENOSCOPY (EGD) WITH PROPOFOL N/A 04/12/2019   Procedure: ESOPHAGOGASTRODUODENOSCOPY (EGD) WITH PROPOFOL;  Surgeon: Tressia DanasBeavers, Kimberly, MD;  Location: [facility];  Service: Gastroenterology;  Laterality: N/A;  .  gastric sleeve  8/12   bariatric surgery Dr Adolphus Birchwood   . LAPAROSCOPIC TUBAL LIGATION  12/09/2010   Procedure: LAPAROSCOPIC TUBAL LIGATION;  Surgeon: Juluis Mire;  Location: [facility];  Service: Gynecology;  Laterality: Bilateral;  Attempted see Nursing note  . pleurocentesis  10/2004  . Pleuryx catheter placement  9/11    VanTright----removed 9/11  . RENAL BIOPSY  09/24/2012  . svd     x 3  .  THORACENTESIS  2010   with penumonia  . WISDOM TOOTH EXTRACTION      Social History   Socioeconomic History  . Marital status: Divorced    Spouse name: Not on file  . Number of children: 3  . Years of education: Not on file  . Highest education level: Not on file  Occupational History  . Occupation: Psychologist, educational at [employer]  . Smoking status: Former Smoker    Packs/day: 0.50    Years: 10.00    Pack years: 5.00    Types: Cigarettes    Quit date: 05/04/2009    Years since quitting: 10.7  . Smokeless tobacco: Never Used  . Tobacco comment: Socially x10 years (1 pack/month)  Vaping Use  . Vaping Use: Never used  Substance and Sexual Activity  . Alcohol use: Yes    Alcohol/week: 0.0 standard drinks    Comment: rare  . Drug use: No  . Sexual activity: Not on file  Other Topics Concern  . Not on file  Social History Narrative  . Not on file   Social Determinants of Health   Financial Resource Strain: Not on file  Food Insecurity: Not on file  Transportation Needs: Not on file  Physical Activity: Not on file  Stress: Not on file  Social Connections: Not on file  Intimate Partner Violence: Not on file    Allergies  Allergen Reactions  . Lisinopril Cough  . Lovenox [Enoxaparin Sodium] Itching  . Moxifloxacin Other (See Comments)    REACTION: hallucinations  . Lovenox [Enoxaparin]     Itching     Family History  Problem Relation Age of Onset  . Lupus Sister   . Hypertension Father   . Hyperlipidemia Father   . Leukemia Sister   . Diabetes Other        GM    Prior to Admission medications   Medication Sig Start Date End Date Taking? Authorizing Provider  bismuth subsalicylate (PEPTO BISMOL) 262 MG/15ML suspension Take 30 mLs by mouth every 6 (six) hours as needed for indigestion or diarrhea or loose stools.   Yes [provider]  calcium carbonate (TUMS - DOSED IN MG ELEMENTAL CALCIUM) 500 MG chewable tablet Chew 1-2 tablets by mouth 3  (three) times daily as needed for indigestion or heartburn.   Yes [provider]  cyanocobalamin (,VITAMIN B-12,) 1000 MCG/ML injection Inject 1,000 mcg into the muscle every 30 (thirty) days.  10/06/19  Yes [provider]  FERREX 150 150 MG capsule TAKE 1 CAPSULE BY MOUTH EVERY DAY Patient taking differently: Take 150 mg by mouth daily. 07/28/19  Yes Tower, Audrie Gallus, MD  hydroxychloroquine (PLAQUENIL) 200 MG tablet Take 200 mg by mouth 2 (two) times daily.   Yes [provider]  labetalol (NORMODYNE) 200 MG tablet TAKE 1 AND 1/2 TABLETS BY MOUTH TWICE A DAY. Patient taking differently: Take 300 mg by mouth in the morning and at bedtime. 01/25/19  Yes Tower, Audrie Gallus, MD  levothyroxine (SYNTHROID) 200 MCG tablet  TAKE 1 TABLET (200 MCG TOTAL) BY MOUTH DAILY BEFORE BREAKFAST. 08/29/19  Yes Tower, Audrie Gallus, MD  Multiple Vitamins-Minerals (BARIATRIC MULTIVITAMINS/IRON) CAPS Take 1 tablet by mouth daily.   Yes [provider]  omeprazole (PRILOSEC) 20 MG capsule TAKE 1 CAPSULE (20 MG TOTAL) BY MOUTH 2 (TWO) TIMES DAILY BEFORE A MEAL. 10/24/19  Yes Tressia Danas, MD  PARoxetine (PAXIL) 40 MG tablet Take 1 tablet (40 mg total) by mouth every morning. Patient taking differently: Take 40 mg by mouth daily. 01/26/19  Yes Tower, Audrie Gallus, MD  rosuvastatin (CRESTOR) 20 MG tablet Take 1 tablet (20 mg total) by mouth daily. Patient taking differently: Take 20 mg by mouth every evening. 01/25/19  Yes Tower, Audrie Gallus, MD  warfarin (COUMADIN) 7.5 MG tablet TAKE 1 & 1/2 TABLETS DAILY EXCEPT 1 TABLET ON MON AND THURS OR AS DIRECTED BY CLINIC 90 DAY SUPPLY Patient taking differently: Take 11.25-15 mg by mouth See admin instructions. Take 1.5 tablets (11.25 mg) by mouth on Mondays, Tuesdays, Wednesdays, Thursdays, Fridays, & Saturdays. Take 2 tablets (15 mg) by mouth on Sundays ONLY. 03/01/19  Yes Tower, Audrie Gallus, MD  methocarbamol (ROBAXIN) 500 MG tablet Take 1 tablet (500 mg total) by  mouth every 8 (eight) hours as needed for muscle spasms. Headache, caution of sedation Patient not taking: Reported on 01/17/2020 09/04/17   Judy Pimple, MD    Physical Exam: Vitals:   01/17/20 1415 01/17/20 1548 01/17/20 1630 01/17/20 1648  BP: 135/80 134/77 131/74   Pulse: 76 84 82   Resp: 18 13 (!) 22   Temp:      SpO2: 98% 97% 99% 99%     . General:  Appears calm and comfortable and in NAD, on RA  . Eyes:   EOMI, normal lids, iris . ENT:  grossly normal hearing, lips & tongue, mmm; appropriate dentition . Neck:  no LAD, masses or thyromegaly . Cardiovascular:  RRR, no r/g, 5/6  Systolic murmur (possibly also diastolic component) with radiation to the back. 1+ taut chronic LE edema.  Marland Kitchen Respiratory:   Diminished breath sounds in LLL (can actually hear heart murmur in this region of her back well).  Normal to mildly increased respiratory effort. . Abdomen:  soft, NT, ND, obese . Back:   normal alignment, no CVAT . Skin:  no rash or induration seen on limited exam . Musculoskeletal:  grossly normal tone BUE/BLE, good ROM, no bony abnormality. Marland Kitchen Psychiatric:  grossly normal mood and affect, speech fluent and appropriate, AOx3 Neurologic:  CN 2-12 grossly intact, moves all extremities in coordinated fashion   Radiological Exams on Admission: Independently reviewed - see discussion in A/P where applicable  DG Chest 2 View  Result Date: 01/17/2020 CLINICAL DATA:  Left chest pain. EXAM: CHEST - 2 VIEW COMPARISON:  CT chest 09/20/2009.  Chest x-ray 09/25/2010. FINDINGS: Large pleural based density left posterior lung base similar but slightly smaller in size compared to the prior study. Mild calcification in the wall of this density has developed in the interval. Probable chronic loculated pleural effusion. There is compressive atelectasis in the left lower lobe Right lung clear.  Negative for heart failure or edema. IMPRESSION: Large loculated pleural fluid collection left posterior  lung base with early calcification. Possible chronic empyema. CT chest with contrast may be helpful if the patient has acute symptoms. Electronically Signed   By: Marlan Palau M.D.   On: 01/17/2020 10:30    EKG: Independently reviewed.  NSR with  rate 85; no evidence of acute ischemia   Labs on Admission: I have personally reviewed the available labs and imaging studies at the time of the admission.  Pertinent labs:   Normal BMP HS troponin negative INR 1.3   Assessment/Plan Principal Problem:   Chest pain of uncertain etiology Active Problems:   Hypothyroidism   HYPERCHOLESTEROLEMIA   Morbid obesity (HCC)   Essential hypertension   Systemic lupus erythematosus (HCC)   OSA on CPAP   Factor V Leiden (HCC)   Empyema lung (HCC)   Chest pain -Patient with h/o SLE with recurrent pleural effusion and ?pericardial effusion presenting with left-sided CP -Pain is not exertional and is rhythmic, occurring for 1-2 minutes every 5-7 minutes (and still occurring) -Does not sound cardiac in nature -However, she has an extraordinarily loud murmur and with her history and current Coumadin dosing this is somewhat concerning for pericardial effusion -No apparent evidence of pericarditis on EKG and history is less consistent with this (not positional) -She was found to have an abnormal CXR on the left and this may be contributing (see below) -For now, will admit to telemetry for further evaluation  L lung empyema -CXR today with large loculated pleural fluid collection in the left posterior lung base with early calcification, ?chronic empyema -In comparison with CXR in 2012, this may be not significantly different -This is a more complex situation given her h/o pleurocentesis with talc and VATS and so CVTS may need to be involved (also possibly depending on echo results) -CT chest is pending -Pulmonology consult requested; since she is currently on room air it seems reasonable for them to see  her tomorrow once the CT has been performed  SLE/Factor V Leiden -She has h/o VTE events and needs to resume Coumadin as soon as appropriate -Currently, INR is 1.3 and this is not being continued while pending Echo; will order SCDs and restart ASAP -She has chronic fatigue that is likely associated with SLE -Continue Plaquenil for now -Assuming recurrent pleural and/or pericardial effusion, she may need additional/alternative SLE therapy  HTN -Continue Labetalol  HLD -Continue Crestor  Anxiety -Continue Paxil  Hypothyroidism -Check TSH -Continue Synthroid at current dose for now  OSA on CPAP -Continue CPAP  Obesity -BMI 53.7 -Weight loss should be encouraged -Outpatient PCP/bariatric medicine/bariatric surgery f/u encouraged    Note: This patient has been tested and is negative for the novel coronavirus COVID-19. The patient has been fully vaccinated against COVID-19.    DVT prophylaxis: Coumadin Code Status:  Full - confirmed with patient/family Family Communication: Daughter was present throughout evaluation Disposition Plan:  The patient is from: home  Anticipated d/c is to: home without [facility] services  Anticipated d/c date will depend on clinical response to treatment, but likely 2-3 days Patient is currently: acutely ill Consults called: Pulmonology; nutrition Admission status:  Admit - It is my clinical opinion that admission to INPATIENT is reasonable and necessary because of the expectation that this patient will require hospital care that crosses at least 2 midnights to treat this condition based on the medical complexity of the problems presented.  Given the aforementioned information, the predictability of an adverse outcome is felt to be significant.    Jonah Blue MD [facility]   How to contact the [facility] Attending or Consulting provider 7A - 7P or covering provider during after hours 7P -7A, for this patient?  1. Check the care team in [facility] and  look for a) attending/consulting [facility] provider listed and  b) the [facility] team listed 2. Log into [email or web address] and use [place] universal password to access. If you do not have the password, please contact the hospital operator. 3. Locate the [facility] provider you are looking for under [facility] and page to a number that you can be directly reached. 4. If you still have difficulty reaching the provider, please page the [facility] (Director on Call) for the Hospitalists listed on amion for assistance.   01/17/2020, 6:04 PM

## 2020-01-17 NOTE — Progress Notes (Signed)
Patient refused CPAP at this time. Patient aware to call for Respiratory if CPAP desired during hospital stay. ?

## 2020-01-17 NOTE — Progress Notes (Signed)
  Echocardiogram 2D Echocardiogram has been performed.  Delcie Roch 01/17/2020, 5:27 PM

## 2020-01-17 NOTE — Telephone Encounter (Signed)
Pt c/o of Chest Pain: STAT if CP now or developed within 24 hours  1. Are you having CP right now? yes  2. Are you experiencing any other symptoms (ex. SOB, nausea, vomiting, sweating)? no  3. How long have you been experiencing CP? Started 7:45 am today  4. Is your CP continuous or coming and going? Comes and goes every 5-7 minutes  5. Have you taken Nitroglycerin? No   Patient states she is having chest pain. She states she is in the ED, but would like to know if she can be seen at the office today. ?

## 2020-01-17 NOTE — Telephone Encounter (Signed)
The patient calls from the ER - she went with CP that she is still experiencing every 5-7 minutes.  She has had an EKG, labs, and a CXR. She would like to leave and be evaluated in the office today. Encouraged her to stay at the hospital since she is having active symptoms. She understands her labs are being trended.  Per her request, scheduled her for visit tomorrow with an APP at the [facility] office.  She will stay at the hospital and only keep the visit if she is discharged.  She was grateful for assistance.

## 2020-01-17 NOTE — Progress Notes (Signed)
Case reviewed, will look at CT and try to determine what, if any interventions need to be done in AM.  CXR looks similar to 2012.  Should r/o cardiac and PE as cause of symptoms.  Myrla Halsted MD

## 2020-01-17 NOTE — ED Triage Notes (Signed)
Pt reports sudden onset of L CP with some tightness to L arm that began at 0745 while drinking her coffee. Denies radiation to her back or sob.

## 2020-01-17 NOTE — ED Provider Notes (Signed)
[facility] EMERGENCY DEPARTMENT Provider Note   CSN: [id number] Arrival date & time: 01/17/20  6063     History Chief Complaint  Patient presents with  . Chest Pain    [name] is a 54 y.o. female.  Patient presents with intermittent left chest pain and tightness with mild left upper arm symptoms since early this morning.  No history of similar.  No radiation to the back.  No fevers chills or cough.  Patient feels overall well.  Patient has history of lupus on Plaquenil, thyroid history as well.  Patient had empyema years ago and was treated and has not had issues since.  Patient has history of factor V Leiden on Coumadin compliant with treatment, history of blood clots.        Past Medical History:  Diagnosis Date  . Arthritis    Lupus - hands/knees  . 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 GERD (gastroesophageal reflux disease)    on prilosec r/t gastric sleeve surgery  . HLD (hyperlipidemia)   . Iron deficiency anemia    IV dextran [place]  . Nephritis and nephropathy, not specified as acute or chronic, with unspecified pathological lesion in kidney   . Nonspecific abnormal results of liver function study   . Obesity, unspecified   . Other specified acquired hypothyroidism   . Panic disorder without agoraphobia   . Personal history of venous thrombosis and embolism 2002   during pregnancy; ?factor 5 leiden (sees heme)  . Pleural effusion 2005   c/w lupus initial w/u; recurrent Right as pred tapered off July 2011  . Sleep apnea   . 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 Unspecified essential hypertension     Patient Active Problem List   Diagnosis Date Noted  . Esophageal diverticulum   . Esophageal dysphagia   . Fatigue 05/20/2019  . Systemic lupus erythematosus with lung involvement  (HCC) 01/04/2019  . Aortic stenosis 08/15/2017  . Vitamin B12 deficiency 11/15/2015  . Nausea & vomiting 10/15/2015  . Allergic rhinitis 07/01/2015  . History of cerebral hemorrhage 07/01/2015  . Chronic daily headache 06/29/2015  . Screening for osteoporosis 11/04/2013  . Encounter for therapeutic drug monitoring 03/25/2013  . CKD (chronic kidney disease) stage 2, GFR 60-89 ml/min 12/20/2012  . 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 Routine general medical examination at a health care facility 12/07/2012  . Steroid long-term use 12/07/2012  . History of HPV infection 12/07/2012  . Long term (current) use of anticoagulants 10/06/2012  . Chronic anticoagulation 04/22/2012  . S/P bariatric surgery 04/22/2012  . Factor V Leiden (HCC) 04/22/2012  . OSA on CPAP 11/20/2011  . Prediabetes 09/16/2010  . EDEMA 01/08/2010  . PLEURAL EFFUSION 08/27/2009  . ENLARGEMENT OF LYMPH NODES 08/13/2009  . History of pulmonary embolism 05/24/2009  . Morbid obesity (HCC) 12/08/2007  . Hypothyroidism 06/09/2006  . HYPERCHOLESTEROLEMIA 06/09/2006  . Generalized anxiety disorder 06/09/2006  . PANIC DISORDER 06/09/2006  . Essential hypertension 06/09/2006  . GLOMERULONEPHRITIS 06/09/2006  . Systemic lupus erythematosus (HCC) 06/09/2006  . DVT, HX OF 06/09/2006    Past Surgical History:  Procedure Laterality Date  . BIOPSY  04/12/2019   Procedure: BIOPSY;  Surgeon: Tressia Danas, MD;  Location: [facility];  Service: Gastroenterology;;  . ESOPHAGEAL MANOMETRY N/A 07/15/2019  Procedure: ESOPHAGEAL MANOMETRY (EM);  Surgeon: Napoleon FormNandigam, Kavitha V, MD;  Location: [facility];  Service: Endoscopy;  Laterality: N/A;  . ESOPHAGOGASTRODUODENOSCOPY (EGD) WITH PROPOFOL N/A 04/12/2019   Procedure: ESOPHAGOGASTRODUODENOSCOPY (EGD) WITH PROPOFOL;  Surgeon: Tressia DanasBeavers, Kimberly, MD;  Location: [facility];  Service: Gastroenterology;  Laterality: N/A;  . gastric sleeve  8/12   bariatric surgery Dr Adolphus Birchwoodasher   .  LAPAROSCOPIC TUBAL LIGATION  12/09/2010   Procedure: LAPAROSCOPIC TUBAL LIGATION;  Surgeon: Juluis MireJohn S McComb;  Location: [facility];  Service: Gynecology;  Laterality: Bilateral;  Attempted see Nursing note  . pleurocentesis  10/2004  . Pleuryx catheter placement  9/11    VanTright----removed 9/11  . RENAL BIOPSY  09/24/2012  . svd     x 3  . THORACENTESIS  2010   with penumonia  . WISDOM TOOTH EXTRACTION       OB History   No obstetric history on file.     Family History  Problem Relation Age of Onset  . Lupus Sister   . Hypertension Father   . Hyperlipidemia Father   . Leukemia Sister   . Diabetes Other        GM    Social History   Tobacco Use  . Smoking status: Former Smoker    Packs/day: 0.50    Years: 10.00    Pack years: 5.00    Types: Cigarettes    Quit date: 05/04/2009    Years since quitting: 10.7  . Smokeless tobacco: Never Used  . Tobacco comment: Socially x10 years (1 pack/month)  Vaping Use  . Vaping Use: Never used  Substance Use Topics  . Alcohol use: Yes    Alcohol/week: 0.0 standard drinks    Comment: rare  . Drug use: No    Home Medications Prior to Admission medications   Medication Sig Start Date End Date Taking? Authorizing Provider  bismuth subsalicylate (PEPTO BISMOL) 262 MG/15ML suspension Take 30 mLs by mouth every 6 (six) hours as needed for indigestion or diarrhea or loose stools.   Yes [provider]  calcium carbonate (TUMS - DOSED IN MG ELEMENTAL CALCIUM) 500 MG chewable tablet Chew 1-2 tablets by mouth 3 (three) times daily as needed for indigestion or heartburn.   Yes [provider]  cyanocobalamin (,VITAMIN B-12,) 1000 MCG/ML injection Inject 1,000 mcg into the muscle every 30 (thirty) days.  10/06/19  Yes [provider]  FERREX 150 150 MG capsule TAKE 1 CAPSULE BY MOUTH EVERY DAY Patient taking differently: Take 150 mg by mouth daily. 07/28/19  Yes Tower, Audrie GallusMarne A, MD  hydroxychloroquine (PLAQUENIL) 200 MG  tablet Take 200 mg by mouth 2 (two) times daily.   Yes [provider]  labetalol (NORMODYNE) 200 MG tablet TAKE 1 AND 1/2 TABLETS BY MOUTH TWICE A DAY. Patient taking differently: Take 300 mg by mouth in the morning and at bedtime. 01/25/19  Yes Tower, Audrie GallusMarne A, MD  levothyroxine (SYNTHROID) 200 MCG tablet TAKE 1 TABLET (200 MCG TOTAL) BY MOUTH DAILY BEFORE BREAKFAST. 08/29/19  Yes Tower, Audrie GallusMarne A, MD  Multiple Vitamins-Minerals (BARIATRIC MULTIVITAMINS/IRON) CAPS Take 1 tablet by mouth daily.   Yes [provider]  omeprazole (PRILOSEC) 20 MG capsule TAKE 1 CAPSULE (20 MG TOTAL) BY MOUTH 2 (TWO) TIMES DAILY BEFORE A MEAL. 10/24/19  Yes Tressia DanasBeavers, Kimberly, MD  PARoxetine (PAXIL) 40 MG tablet Take 1 tablet (40 mg total) by mouth every morning. Patient taking differently: Take 40 mg by mouth daily. 01/26/19  Yes Tower, [employer]	[place] A,  MD  rosuvastatin (CRESTOR) 20 MG tablet Take 1 tablet (20 mg total) by mouth daily. Patient taking differently: Take 20 mg by mouth every evening. 01/25/19  Yes Tower, Audrie Gallus, MD  warfarin (COUMADIN) 7.5 MG tablet TAKE 1 & 1/2 TABLETS DAILY EXCEPT 1 TABLET ON MON AND THURS OR AS DIRECTED BY CLINIC 90 DAY SUPPLY Patient taking differently: Take 11.25-15 mg by mouth See admin instructions. Take 1.5 tablets (11.25 mg) by mouth on Mondays, Tuesdays, Wednesdays, Thursdays, Fridays, & Saturdays. Take 2 tablets (15 mg) by mouth on Sundays ONLY. 03/01/19  Yes Tower, Audrie Gallus, MD  methocarbamol (ROBAXIN) 500 MG tablet Take 1 tablet (500 mg total) by mouth every 8 (eight) hours as needed for muscle spasms. Headache, caution of sedation Patient not taking: Reported on 01/17/2020 09/04/17   Judy Pimple, MD    Allergies    Lisinopril, Lovenox [enoxaparin sodium], Moxifloxacin, and Lovenox [enoxaparin]  Review of Systems   Review of Systems  Constitutional: Negative for chills and fever.  HENT: Negative for congestion.   Eyes: Negative for visual disturbance.   Respiratory: Negative for shortness of breath.   Cardiovascular: Positive for chest pain. Negative for leg swelling.  Gastrointestinal: Negative for abdominal pain and vomiting.  Genitourinary: Negative for dysuria and flank pain.  Musculoskeletal: Negative for back pain, neck pain and neck stiffness.  Skin: Negative for rash.  Neurological: Negative for light-headedness and headaches.    Physical Exam Updated Vital Signs BP 135/80 (BP Location: Left Arm)   Pulse 76   Temp 98.4 F (36.9 C)   Resp 18   LMP 11/08/2010   SpO2 98%   Physical Exam Vitals and nursing note reviewed.  Constitutional:      Appearance: She is well-developed and well-nourished.  HENT:     Head: Normocephalic and atraumatic.  Eyes:     General:        Right eye: No discharge.        Left eye: No discharge.     Conjunctiva/sclera: Conjunctivae normal.  Neck:     Trachea: No tracheal deviation.  Cardiovascular:     Rate and Rhythm: Normal rate and regular rhythm.  Pulmonary:     Effort: Pulmonary effort is normal.     Breath sounds: Examination of the left-middle field reveals decreased breath sounds. Examination of the left-lower field reveals decreased breath sounds. Decreased breath sounds present.  Abdominal:     General: There is no distension.     Palpations: Abdomen is soft.     Tenderness: There is no abdominal tenderness. There is no guarding.  Musculoskeletal:        General: No edema.     Cervical back: Normal range of motion and neck supple.  Skin:    General: Skin is warm.     Findings: No rash.  Neurological:     Mental Status: She is alert and oriented to person, place, and time.  Psychiatric:        Mood and Affect: Mood and affect normal.     ED Results / Procedures / Treatments   Labs (all labs ordered are listed, but only abnormal results are displayed) Labs Reviewed  BASIC METABOLIC PANEL - Abnormal; Notable for the following components:      Result Value    Creatinine, Ser 1.03 (*)    All other components within normal limits  CBC - Abnormal; Notable for the following components:   Hemoglobin 11.0 (*)    MCH 23.3 (*)  MCHC 28.5 (*)    RDW 16.0 (*)    All other components within normal limits  I-STAT BETA HCG BLOOD, ED ([facility], [facility], [facility] ONLY) - Abnormal; Notable for the following components:   I-stat hCG, quantitative 5.3 (*)    All other components within normal limits  RESP PANEL BY RT-PCR (FLU A&B, COVID) ARPGX2  PROTIME-INR  TROPONIN I (HIGH SENSITIVITY)  TROPONIN I (HIGH SENSITIVITY)    EKG EKG Interpretation  Date/Time:  Tuesday January 17 2020 09:50:09 EST Ventricular Rate:  85 PR Interval:  180 QRS Duration: 96 QT Interval:  378 QTC Calculation: 449 R Axis:   92 Text Interpretation: Normal sinus rhythm Rightward axis Borderline ECG Confirmed by Blane Ohara [phone]) on 01/17/2020 3:25:15 PM   Radiology DG Chest 2 View  Result Date: 01/17/2020 CLINICAL DATA:  Left chest pain. EXAM: CHEST - 2 VIEW COMPARISON:  CT chest 09/20/2009.  Chest x-ray 09/25/2010. FINDINGS: Large pleural based density left posterior lung base similar but slightly smaller in size compared to the prior study. Mild calcification in the wall of this density has developed in the interval. Probable chronic loculated pleural effusion. There is compressive atelectasis in the left lower lobe Right lung clear.  Negative for heart failure or edema. IMPRESSION: Large loculated pleural fluid collection left posterior lung base with early calcification. Possible chronic empyema. CT chest with contrast may be helpful if the patient has acute symptoms. Electronically Signed   By: Marlan Palau M.D.   On: 01/17/2020 10:30    Procedures Procedures (including critical care time)  Medications Ordered in ED Medications - No data to display  ED Course  I have reviewed the triage vital signs and the nursing notes.  Pertinent labs & imaging results that were available  during my care of the patient were reviewed by me and considered in my medical decision making (see chart for details).    MDM Rules/Calculators/A&P                         Patient presents with intermittent chest pain left-sided.  Cardiac/ACS/lung evaluation with broad differential initially.  Chest x-ray showed likely empyema large on the left.  Possibility lupus/immunosuppressants. Patient has no fever, no shortness of breath, vital signs stable.  CT scan ordered for further delineation.  Remainder of blood work unremarkable Cr 1, normal white blood cell count, 11 hemoglobin, negative troponin. EKG reviewed unremarkable. Patient CARE signed out to follow-up CT scan results, hospitalist paged to discuss admission.     Final Clinical Impression(s) / ED Diagnoses Final diagnoses:  Acute chest pain  Empyema [facility])    Rx / DC Orders ED Discharge Orders    None       Blane Ohara, MD 01/17/20 1528

## 2020-01-17 NOTE — Plan of Care (Signed)
  Problem: Education: Goal: Knowledge of General Education information will improve Description: Including pain rating scale, medication(s)/side effects and non-pharmacologic comfort measures Outcome: Progressing   Problem: Health Behavior/Discharge Planning: Goal: Ability to manage health-related needs will improve Outcome: Progressing   Problem: Clinical Measurements: Goal: Respiratory complications will improve Outcome: Progressing   Problem: Activity: Goal: Risk for activity intolerance will decrease Outcome: Progressing   Problem: Nutrition: Goal: Adequate nutrition will be maintained Outcome: Progressing   Problem: Coping: Goal: Level of anxiety will decrease Outcome: Progressing   Problem: Elimination: Goal: Will not experience complications related to bowel motility Outcome: Progressing Goal: Will not experience complications related to urinary retention Outcome: Progressing

## 2020-01-18 ENCOUNTER — Ambulatory Visit: Payer: 59 | Admitting: Cardiology

## 2020-01-18 DIAGNOSIS — I350 Nonrheumatic aortic (valve) stenosis: Principal | ICD-10-CM

## 2020-01-18 DIAGNOSIS — I10 Essential (primary) hypertension: Secondary | ICD-10-CM

## 2020-01-18 DIAGNOSIS — D6851 Activated protein C resistance: Secondary | ICD-10-CM

## 2020-01-18 DIAGNOSIS — R079 Chest pain, unspecified: Secondary | ICD-10-CM

## 2020-01-18 LAB — BASIC METABOLIC PANEL
Anion gap: 10 (ref 5–15)
BUN: 13 mg/dL (ref 6–20)
CO2: 24 mmol/L (ref 22–32)
Calcium: 8.7 mg/dL — ABNORMAL LOW (ref 8.9–10.3)
Chloride: 104 mmol/L (ref 98–111)
Creatinine, Ser: 1.01 mg/dL — ABNORMAL HIGH (ref 0.44–1.00)
GFR, Estimated: >60 mL/min (ref >60)
Glucose, Bld: 102 mg/dL — ABNORMAL HIGH (ref 70–99)
Potassium: 5 mmol/L (ref 3.5–5.1)
Sodium: 138 mmol/L (ref 135–145)

## 2020-01-18 LAB — CBC
HCT: 32.4 % — ABNORMAL LOW (ref 36.0–46.0)
Hemoglobin: 10 g/dL — ABNORMAL LOW (ref 12.0–15.0)
MCH: 24.3 pg — ABNORMAL LOW (ref 26.0–34.0)
MCHC: 30.9 g/dL (ref 30.0–36.0)
MCV: 78.8 fL — ABNORMAL LOW (ref 80.0–100.0)
Platelets: UNDETERMINED 10*3/uL (ref 150–400)
RBC: 4.11 MIL/uL (ref 3.87–5.11)
RDW: 16.2 % — ABNORMAL HIGH (ref 11.5–15.5)
WBC: 4.5 10*3/uL (ref 4.0–10.5)
nRBC: 0 % (ref 0.0–0.2)

## 2020-01-18 LAB — HEPARIN LEVEL (UNFRACTIONATED)
Heparin Unfractionated: 0.32 IU/mL (ref 0.30–0.70)
Heparin Unfractionated: 0.2 IU/mL — ABNORMAL LOW (ref 0.30–0.70)
Heparin Unfractionated: 0.23 IU/mL — ABNORMAL LOW (ref 0.30–0.70)

## 2020-01-18 LAB — TSH: TSH: 0.42 u[IU]/mL (ref 0.350–4.500)

## 2020-01-18 LAB — PROTIME-INR
INR: 1.2 (ref 0.8–1.2)
Prothrombin Time: 14.7 seconds (ref 11.4–15.2)

## 2020-01-18 LAB — HIV ANTIBODY (ROUTINE TESTING W REFLEX): HIV Screen 4th Generation wRfx: NONREACTIVE

## 2020-01-18 MED ORDER — ASPIRIN 81 MG PO CHEW
81.0000 mg | CHEWABLE_TABLET | ORAL | Status: AC
  Administered 2020-01-19: 81 mg via ORAL
  Filled 2020-01-18: qty 1

## 2020-01-18 MED ORDER — WARFARIN SODIUM 7.5 MG PO TABS: 15.0000 mg | ORAL_TABLET | Freq: Once | ORAL | Status: DC

## 2020-01-18 MED ORDER — SODIUM CHLORIDE 0.9 % WEIGHT BASED INFUSION
1.0000 mL/kg/h | INTRAVENOUS | Status: DC
  Administered 2020-01-19 (×2): 1 mL/kg/h via INTRAVENOUS

## 2020-01-18 MED ORDER — SODIUM CHLORIDE 0.9% FLUSH: 3.0000 mL | INTRAVENOUS | Status: DC | PRN

## 2020-01-18 MED ORDER — SODIUM CHLORIDE 0.9 % IV SOLN: 250.0000 mL | INTRAVENOUS | Status: DC | PRN

## 2020-01-18 MED ORDER — SODIUM CHLORIDE 0.9 % WEIGHT BASED INFUSION
3.0000 mL/kg/h | INTRAVENOUS | Status: DC
  Administered 2020-01-19: 3 mL/kg/h via INTRAVENOUS

## 2020-01-18 MED ORDER — WARFARIN SODIUM 7.5 MG PO TABS: 17.5000 mg | ORAL_TABLET | Freq: Once | ORAL | Status: DC

## 2020-01-18 MED ORDER — ADULT MULTIVITAMIN W/MINERALS CH
1.0000 | ORAL_TABLET | Freq: Two times a day (BID) | ORAL | Status: DC
  Administered 2020-01-18 – 2020-01-20 (×5): 1 via ORAL
  Filled 2020-01-18 (×5): qty 1

## 2020-01-18 MED ORDER — SODIUM CHLORIDE 0.9% FLUSH
3.0000 mL | Freq: Two times a day (BID) | INTRAVENOUS | Status: DC
  Administered 2020-01-18 – 2020-01-19 (×2): 3 mL via INTRAVENOUS

## 2020-01-18 MED ORDER — ENSURE MAX PROTEIN PO LIQD
11.0000 [oz_av] | Freq: Every day | ORAL | Status: DC
  Administered 2020-01-18 – 2020-01-20 (×3): 11 [oz_av] via ORAL
  Filled 2020-01-18 (×3): qty 330

## 2020-01-18 MED ORDER — CALCIUM CARBONATE ANTACID 500 MG PO CHEW
1.0000 | CHEWABLE_TABLET | Freq: Three times a day (TID) | ORAL | Status: DC
  Administered 2020-01-18 – 2020-01-20 (×7): 200 mg via ORAL
  Filled 2020-01-18 (×7): qty 1

## 2020-01-18 NOTE — Consult Note (Signed)
NAME:  [name], MRN:  [id number], DOB:  Mar 12, 1965, LOS: 1 ADMISSION DATE:  01/17/2020, CONSULTATION DATE:  01/18/20 REFERRING MD:  Ophelia Charter, CHIEF COMPLAINT:  Chest pain   Brief History   54 year old woman with hx of Lupus, recurrent empyema post pleurodesis presenting with left sided chest pain and abnormal chest imaging.  History of present illness   54 year old woman with rather complex PMH detailed below presenting with 1 day of worsening left chest pain.  Pain described as twinging/sharp, intermittent, along left sternal border pinpoint in location without radiation, waxing and waning every few minutes.  No alleviating or aggrevating factors.  No associated dyspnea.  This is in background of systemic symptoms including fatigue, malaise, DOE that have been slowly progressive over past couple months.  She also has been having esophageal issues with sensation of food getting stuck (see pertinent esophageal study from Dec 2020 below).  Chest imaging was abnormal so PCCM consulted to evaluate next steps in management.  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 2. Limited assessment of the stomach due to the limited amount of contrast which would pass distally into the changes of sleeve gastrectomy. 3. Marked stasis in the esophagus with patulous  esophagus as described. Endoscopic correlation may be helpful.  Micro Data:  covid neg  Antimicrobials:  none   Interim history/subjective:  Consulted Chest pain resolved  Objective   Blood pressure 116/77, pulse 81, temperature 97.7 F (36.5 C), temperature source Oral, resp. rate 20, height 5\' 5"  (1.651 m), weight (!) 142.7 kg, last menstrual period 11/08/2010, SpO2 95 %.    FiO2 (%):  [21 %] 21 %  No intake or output data in the 24 hours ending 01/18/20 0816 Filed Weights   01/17/20 1900 01/17/20 2046  Weight: 136.1 kg (!) 142.7 kg    Examination: Constitutional: middle age woman in no acute distress  Eyes: EOMI, pupils equal Ears, nose, mouth, and throat: [place] 4, trachea midline Cardiovascular: RRR, loud systolic ejection murmur heard throughout Respiratory: diminished left base, otherwise clear Gastrointestinal: soft, +BS Skin: No rashes, normal turgor Neurologic: Moves all 4 ext to command, fair strength Psychiatric: pleasant, Aox3  Trop neg Labs unremarkable  EGD March 2021 - Dilated esophagus, most pronounced in the distal esophagus. - Esophageal mucosal abnormality - ? diverticulum. Biopsied. Biopsies also obtained from the mid and proximal esophagus. - A single gastric polyp. Biopsied. - Normal examined duodenum. Biopsied. - No obvious source for iron deficiency identified. Await biopsy results.  Echo12/14/21 1. Left ventricular ejection fraction, by estimation, is 65 to 70%. The  left ventricle has normal function. The left ventricle has no regional  wall motion abnormalities. There is mild-to-moderate concentric left  ventricular hypertrophy. Left ventricular  diastolic parameters are consistent with Grade II diastolic dysfunction  (pseudonormalization).  2. Right ventricular systolic function is normal. The right ventricular  size is normal.  3. A  small pericardial effusion appears to be present around the RA  although visualization is  limited.  4. The mitral valve is normal in structure. Trivial mitral valve  regurgitation.  5. The aortic valve is tricuspid. There is moderate calcification of the  aortic valve. There is moderate thickening of the aortic valve. There is  severe aortic valve stenosis with a mean gradient of , peak gradient  71.106mmHg, peak velocity 4.2 m/s,  AVA 0.9cm2 by continuity and DOI 0.26  6. The inferior vena cava is normal in size with greater than 50%  respiratory variability, suggesting right atrial pressure of 3 mmHg.   Resolved Hospital Problem list   n/a  Assessment & Plan:  Left chest pains, abnormal chest imaging, hx VTE/Factor 5 Lieden- breakthrough VTE ruled out.  Not c/w pleurisy/pericarditis by clinical history.  Chest imaging changed for chronic left effusion.  .  My highest suspicion is esophageal spasms given prior UGI series and EGD.  Her aortic stenosis is now severe and combined with systemic symptoms of progressive fatigue may warrant LHC and structural heart eval for TAVR if she is a candidate.   - I would get a cardiology consult for her systemic symptoms and worsening aortic stenosis - No intervention warranted on left pleural space - Regarding her chest pains, need to get sense of if recurrent and associated with PO, meal modification +/- calcium channel blocker may be warranted if she has persistent issues; this may be deferred to GI as an outpatient at primary's discretion.  For now she should remain upright for all meals for at least a couple hours, continue her PPI.   PCCM available PRN, please reach out if any issues    Labs   CBC: Recent Labs  Lab 01/17/20 1002  WBC 4.1  HGB 11.0*  HCT 38.6  MCV 81.6  PLT 209    Basic Metabolic Panel: Recent Labs  Lab 01/17/20 1002 01/18/20 0248  NA 142 138  K 4.6 5.0  CL 104 104  CO2 24 24  GLUCOSE 95 102*  BUN 12 13  CREATININE 1.03* 1.01*  CALCIUM 9.9 8.7*   GFR: Estimated Creatinine Clearance:  92.8 mL/min (A) (by C-G formula based on SCr of 1.01 mg/dL (H)). Recent Labs  Lab 01/17/20 1002  WBC 4.1    Liver Function Tests: No results for input(s): AST, ALT, ALKPHOS, BILITOT, PROT, ALBUMIN in the last 168 hours. No results for input(s): LIPASE, AMYLASE in the last 168 hours. No results for input(s): AMMONIA in the last 168 hours.  ABG    Component Value Date/Time   PHART 7.366 10/02/2009 0432   PCO2ART 50.2 (H) 10/02/2009 0432   PO2ART 68.0 (L) 10/02/2009 0432   HCO3 28.8 (H) 10/02/2009 0432   TCO2 28 04/16/2012 1117   ACIDBASEDEF 0.4 05/24/2008 1210   O2SAT 92.0 10/02/2009 0432     Coagulation Profile: Recent Labs  Lab 01/17/20 1509 01/18/20 0248  INR 1.3* 1.2    Cardiac Enzymes: No results for input(s): CKTOTAL, CKMB, CKMBINDEX, TROPONINI in the last 168 hours.  HbA1C: Hgb A1c MFr Bld  Date/Time Value Ref Range Status  01/04/2019 04:15 PM 5.7 4.6 - 6.5 % Final    Comment:    Glycemic Control Guidelines for People with Diabetes:Non Diabetic:  <6%Goal of Therapy: <7%Additional Action Suggested:  >8%   11/12/2017 12:01 PM 5.5 4.6 - 6.5 % Final    Comment:    Glycemic Control Guidelines for People with Diabetes:Non Diabetic:  <6%Goal of  Therapy: <7%Additional Action Suggested:  >8%     CBG: No results for input(s): GLUCAP in the last 168 hours.  Review of Systems:    Positive Symptoms in bold:  Constitutional fevers, chills, weight loss, fatigue, anorexia, malaise  Eyes decreased vision, double vision, eye irritation  Ears, Nose, Mouth, Throat sore throat, trouble swallowing, sinus congestion  Cardiovascular chest pain, paroxysmal nocturnal dyspnea, lower ext edema, palpitations   Respiratory SOB, cough, DOE, hemoptysis, wheezing  Gastrointestinal nausea, vomiting, diarrhea  Genitourinary burning with urination, trouble urinating  Musculoskeletal joint aches, joint swelling, back pain  Integumentary  rashes, skin lesions  Neurological focal  weakness, focal numbness, trouble speaking, headaches  Psychiatric depression, anxiety, confusion  Endocrine polyuria, polydipsia, cold intolerance, heat intolerance  Hematologic abnormal bruising, abnormal bleeding, unexplained nose bleeds  Allergic/Immunologic recurrent infections, hives, swollen lymph nodes     Past Medical History  She,  has a past medical history of Arthritis, 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, Enlargement of lymph nodes, GERD (gastroesophageal reflux disease), HLD (hyperlipidemia), Iron deficiency anemia, Nephritis and nephropathy, not specified as acute or chronic, with unspecified pathological lesion in kidney, Nonspecific abnormal results of liver function study, Obesity, unspecified, Other specified acquired hypothyroidism, Panic disorder without agoraphobia, Personal history of venous thrombosis and embolism (2002), Pleural effusion (2005), Sleep apnea, Systemic lupus erythematosus (HCC), and Unspecified essential hypertension.   Surgical History    Past Surgical History:  Procedure Laterality Date  . BIOPSY  04/12/2019   Procedure: BIOPSY;  Surgeon: Tressia DanasBeavers, Kimberly, MD;  [facility];  Service: Gastroenterology;;  . ESOPHAGEAL MANOMETRY N/A 07/15/2019   Procedure: ESOPHAGEAL MANOMETRY (EM);  Surgeon: Napoleon FormNandigam, Kavitha V, MD;  Location: [facility];  Service: Endoscopy;  Laterality: N/A;  . ESOPHAGOGASTRODUODENOSCOPY (EGD) WITH PROPOFOL N/A 04/12/2019   Procedure: ESOPHAGOGASTRODUODENOSCOPY (EGD) WITH PROPOFOL;  Surgeon: Tressia DanasBeavers, Kimberly, MD;  Location: [facility];  Service: Gastroenterology;  Laterality: N/A;  . gastric sleeve  8/12   bariatric surgery Dr Adolphus Birchwoodasher   . LAPAROSCOPIC TUBAL LIGATION  12/09/2010   Procedure: LAPAROSCOPIC TUBAL LIGATION;  Surgeon: Juluis MireJohn S McComb;  Location: [facility];  Service: Gynecology;  Laterality: Bilateral;  Attempted see Nursing note  . pleurocentesis  10/2004  . Pleuryx catheter placement  9/11    VanTright----removed  9/11  . RENAL BIOPSY  09/24/2012  . svd     x 3  . THORACENTESIS  2010   with penumonia  . WISDOM TOOTH EXTRACTION       Social History   reports that she quit smoking about 10 years ago. Her smoking use included cigarettes. She has a 5.00 pack-year smoking history. She has never used smokeless tobacco. She reports current alcohol use. She reports that she does not use drugs.   Family History   Her family history includes Diabetes in an other family member; Hyperlipidemia in her father; Hypertension in her father; Leukemia in her sister; Lupus in her sister.   Allergies Allergies  Allergen Reactions  . Lisinopril Cough  . Lovenox [Enoxaparin Sodium] Itching  . Moxifloxacin Other (See Comments)    REACTION: hallucinations  . Lovenox [Enoxaparin]     Itching      Home Medications  Prior to Admission medications   Medication Sig Start Date End Date Taking? Authorizing Provider  bismuth subsalicylate (PEPTO BISMOL) 262 MG/15ML suspension Take 30 mLs by mouth every 6 (six) hours as needed for indigestion or diarrhea or loose stools.   Yes [provider]  calcium carbonate (TUMS - DOSED IN  MG ELEMENTAL CALCIUM) 500 MG chewable tablet Chew 1-2 tablets by mouth 3 (three) times daily as needed for indigestion or heartburn.   Yes [provider]  cyanocobalamin (,VITAMIN B-12,) 1000 MCG/ML injection Inject 1,000 mcg into the muscle every 30 (thirty) days.  10/06/19  Yes [provider]  FERREX 150 150 MG capsule TAKE 1 CAPSULE BY MOUTH EVERY DAY Patient taking differently: Take 150 mg by mouth daily. 07/28/19  Yes Tower, Audrie Gallus, MD  hydroxychloroquine (PLAQUENIL) 200 MG tablet Take 200 mg by mouth 2 (two) times daily.   Yes [provider]  labetalol (NORMODYNE) 200 MG tablet TAKE 1 AND 1/2 TABLETS BY MOUTH TWICE A DAY. Patient taking differently: Take 300 mg by mouth in the morning and at bedtime. 01/25/19  Yes Tower, Audrie Gallus, MD  levothyroxine  (SYNTHROID) 200 MCG tablet TAKE 1 TABLET (200 MCG TOTAL) BY MOUTH DAILY BEFORE BREAKFAST. 08/29/19  Yes Tower, Audrie Gallus, MD  Multiple Vitamins-Minerals (BARIATRIC MULTIVITAMINS/IRON) CAPS Take 1 tablet by mouth daily.   Yes [provider]  omeprazole (PRILOSEC) 20 MG capsule TAKE 1 CAPSULE (20 MG TOTAL) BY MOUTH 2 (TWO) TIMES DAILY BEFORE A MEAL. 10/24/19  Yes Tressia Danas, MD  PARoxetine (PAXIL) 40 MG tablet Take 1 tablet (40 mg total) by mouth every morning. Patient taking differently: Take 40 mg by mouth daily. 01/26/19  Yes Tower, Audrie Gallus, MD  rosuvastatin (CRESTOR) 20 MG tablet Take 1 tablet (20 mg total) by mouth daily. Patient taking differently: Take 20 mg by mouth every evening. 01/25/19  Yes Tower, Audrie Gallus, MD  warfarin (COUMADIN) 7.5 MG tablet TAKE 1 & 1/2 TABLETS DAILY EXCEPT 1 TABLET ON MON AND THURS OR AS DIRECTED BY CLINIC 90 DAY SUPPLY Patient taking differently: Take 11.25-15 mg by mouth See admin instructions. Take 1.5 tablets (11.25 mg) by mouth on Mondays, Tuesdays, Wednesdays, Thursdays, Fridays, & Saturdays. Take 2 tablets (15 mg) by mouth on Sundays ONLY. 03/01/19  Yes Tower, Audrie Gallus, MD

## 2020-01-18 NOTE — Plan of Care (Signed)

## 2020-01-18 NOTE — Progress Notes (Signed)
ANTICOAGULATION CONSULT NOTE - Initial Consult  Pharmacy Consult for heparin, coumadin Indication: DVT  Allergies  Allergen Reactions  . Lisinopril Cough  . Lovenox [Enoxaparin Sodium] Itching  . Moxifloxacin Other (See Comments)    REACTION: hallucinations  . Lovenox [Enoxaparin]     Itching     Patient Measurements: Height: 5\' 5"  (165.1 cm) Weight: (!) 142.7 kg (314 lb 9.5 oz) IBW/kg (Calculated) : 57 Heparin Dosing Weight: 92.7 kg  Vital Signs: Temp: 98.1 F (36.7 C) (12/14 2359) Temp Source: Oral (12/14 2359) BP: 114/72 (12/14 2359) Pulse Rate: 86 (12/14 2359)  Labs: Recent Labs    01/17/20 1002 01/17/20 1157 01/17/20 1509 01/18/20 0248  HGB 11.0*  --   --   --   HCT 38.6  --   --   --   PLT 209  --   --   --   LABPROT  --   --  15.4* 14.7  INR  --   --  1.3* 1.2  HEPARINUNFRC  --   --   --  0.32  CREATININE 1.03*  --   --   --   TROPONINIHS 7 6  --   --     Estimated Creatinine Clearance: 91 mL/min (A) (by C-G formula based on SCr of 1.03 mg/dL (H)).   Medical History: Past Medical History:  Diagnosis Date  . Arthritis    Lupus - hands/knees  . 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 GERD (gastroesophageal reflux disease)    on prilosec r/t gastric sleeve surgery  . HLD (hyperlipidemia)   . Iron deficiency anemia    IV dextran Coladonato  . Nephritis and nephropathy, not specified as acute or chronic, with unspecified pathological lesion in kidney   . Nonspecific abnormal results of liver function study   . Obesity, unspecified   . Other specified acquired hypothyroidism   . Panic disorder without agoraphobia   . Personal history of venous thrombosis and embolism 2002   during pregnancy; ?factor 5 leiden (sees heme)  . Pleural effusion 2005   c/w lupus initial w/u; recurrent Right as pred tapered off July 2011  . Sleep apnea   . Systemic  lupus erythematosus (HCC)    renal GN, hx of pericardial eff in late 90's  . Unspecified essential hypertension     Medications:  Facility-Administered Medications Prior to Admission  Medication Dose Route Frequency Provider Last Rate Last Admin  . cyanocobalamin ((VITAMIN B-12)) injection 1,000 mcg  1,000 mcg Intramuscular Q30 days Tower, 02-13-1995 A, MD   1,000 mcg at 12/22/19 1151  . cyanocobalamin ((VITAMIN B-12)) injection 1,000 mcg  1,000 mcg Intramuscular Once 12/24/19, MD       Medications Prior to Admission  Medication Sig Dispense Refill Last Dose  . bismuth subsalicylate (PEPTO BISMOL) 262 MG/15ML suspension Take 30 mLs by mouth every 6 (six) hours as needed for indigestion or diarrhea or loose stools.   Past Week at Unknown time  . calcium carbonate (TUMS - DOSED IN MG ELEMENTAL CALCIUM) 500 MG chewable tablet Chew 1-2 tablets by mouth 3 (three) times daily as needed for indigestion or heartburn.   01/16/2020 at Unknown time  . cyanocobalamin (,VITAMIN B-12,) 1000 MCG/ML injection Inject 1,000 mcg into the muscle every 30 (thirty) days.    Past Month at Unknown time  . FERREX 150 150 MG capsule  TAKE 1 CAPSULE BY MOUTH EVERY DAY (Patient taking differently: Take 150 mg by mouth daily.) 90 capsule 0 Past Month at Unknown time  . hydroxychloroquine (PLAQUENIL) 200 MG tablet Take 200 mg by mouth 2 (two) times daily.   01/17/2020 at Unknown time  . labetalol (NORMODYNE) 200 MG tablet TAKE 1 AND 1/2 TABLETS BY MOUTH TWICE A DAY. (Patient taking differently: Take 300 mg by mouth in the morning and at bedtime.) 90 tablet 11 01/17/2020 at Unknown time  . levothyroxine (SYNTHROID) 200 MCG tablet TAKE 1 TABLET (200 MCG TOTAL) BY MOUTH DAILY BEFORE BREAKFAST. 90 tablet 1 01/17/2020 at Unknown time  . Multiple Vitamins-Minerals (BARIATRIC MULTIVITAMINS/IRON) CAPS Take 1 tablet by mouth daily.   01/16/2020 at Unknown time  . omeprazole (PRILOSEC) 20 MG capsule TAKE 1 CAPSULE (20 MG TOTAL) BY  MOUTH 2 (TWO) TIMES DAILY BEFORE A MEAL. 180 capsule 1 01/17/2020 at Unknown time  . PARoxetine (PAXIL) 40 MG tablet Take 1 tablet (40 mg total) by mouth every morning. (Patient taking differently: Take 40 mg by mouth daily.) 90 tablet 3 01/17/2020 at Unknown time  . rosuvastatin (CRESTOR) 20 MG tablet Take 1 tablet (20 mg total) by mouth daily. (Patient taking differently: Take 20 mg by mouth every evening.) 30 tablet 11 01/16/2020 at Unknown time  . warfarin (COUMADIN) 7.5 MG tablet TAKE 1 & 1/2 TABLETS DAILY EXCEPT 1 TABLET ON MON AND THURS OR AS DIRECTED BY CLINIC 90 DAY SUPPLY (Patient taking differently: Take 11.25-15 mg by mouth See admin instructions. Take 1.5 tablets (11.25 mg) by mouth on Mondays, Tuesdays, Wednesdays, Thursdays, Fridays, & Saturdays. Take 2 tablets (15 mg) by mouth on Sundays ONLY.) 145 tablet 0 01/16/2020 at 7pm    Assessment: 54 yo lady to start heparin while INR subtherapeutic.   Goal of Therapy:  Heparin level 0.3-0.7 units/ml Monitor platelets by anticoagulation protocol: Yes   Plan:  Heparin drip started at 1350 units/hr.  Heparin level 0.32 units/ml Will continue heparin at this rate and check in ~6-8 hours to confirm  [name] 01/18/2020,3:25 AM

## 2020-01-18 NOTE — Progress Notes (Addendum)
ANTICOAGULATION CONSULT NOTE   Pharmacy Consult for heparin, coumadin Indication: DVT  Allergies  Allergen Reactions  . Lisinopril Cough  . Lovenox [Enoxaparin Sodium] Itching  . Moxifloxacin Other (See Comments)    REACTION: hallucinations  . Lovenox [Enoxaparin]     Itching     Patient Measurements: Height: 5\' 5"  (165.1 cm) Weight: (!) 142.7 kg (314 lb 9.5 oz) IBW/kg (Calculated) : 57 Heparin Dosing Weight: 92.7 kg  Vital Signs: Temp: 98.4 F (36.9 C) (12/15 1143) Temp Source: Oral (12/15 1143) BP: 120/74 (12/15 1143) Pulse Rate: 80 (12/15 1200)  Labs: Recent Labs    01/17/20 1002 01/17/20 1157 01/17/20 1509 01/18/20 0248 01/18/20 1056  HGB 11.0*  --   --  10.0*  --   HCT 38.6  --   --  32.4*  --   PLT 209  --   --  PLATELET CLUMPS NOTED ON SMEAR, UNABLE TO ESTIMATE  --   LABPROT  --   --  15.4* 14.7  --   INR  --   --  1.3* 1.2  --   HEPARINUNFRC  --   --   --  0.32 0.20*  CREATININE 1.03*  --   --  1.01*  --   TROPONINIHS 7 6  --   --   --     Estimated Creatinine Clearance: 92.8 mL/min (A) (by C-G formula based on SCr of 1.01 mg/dL (H)).   Medical History: Past Medical History:  Diagnosis Date  . Arthritis    Lupus - hands/knees  . 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 GERD (gastroesophageal reflux disease)    on prilosec r/t gastric sleeve surgery  . HLD (hyperlipidemia)   . Iron deficiency anemia    IV dextran Coladonato  . Nephritis and nephropathy, not specified as acute or chronic, with unspecified pathological lesion in kidney   . Nonspecific abnormal results of liver function study   . Obesity, unspecified   . Other specified acquired hypothyroidism   . Panic disorder without agoraphobia   . Personal history of venous thrombosis and embolism 2002   during pregnancy; ?factor 5 leiden (sees heme)  . Pleural effusion 2005   c/w lupus  initial w/u; recurrent Right as pred tapered off July 2011  . Sleep apnea   . 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 Unspecified essential hypertension     Medications:  Facility-Administered Medications Prior to Admission  Medication Dose Route Frequency Provider Last Rate Last Admin  . cyanocobalamin ((VITAMIN B-12)) injection 1,000 mcg  1,000 mcg Intramuscular Q30 days Tower, 02-13-1995 A, MD   1,000 mcg at 12/22/19 1151  . cyanocobalamin ((VITAMIN B-12)) injection 1,000 mcg  1,000 mcg Intramuscular Once 12/24/19, MD       Medications Prior to Admission  Medication Sig Dispense Refill Last Dose  . bismuth subsalicylate (PEPTO BISMOL) 262 MG/15ML suspension Take 30 mLs by mouth every 6 (six) hours as needed for indigestion or diarrhea or loose stools.   Past Week at Unknown time  . calcium carbonate (TUMS - DOSED IN MG ELEMENTAL CALCIUM) 500 MG chewable tablet Chew 1-2 tablets by mouth 3 (three) times daily as needed for indigestion or heartburn.   01/16/2020 at Unknown time  . cyanocobalamin (,VITAMIN B-12,) 1000 MCG/ML injection Inject 1,000 mcg into  the muscle every 30 (thirty) days.    Past Month at Unknown time  . FERREX 150 150 MG capsule TAKE 1 CAPSULE BY MOUTH EVERY DAY (Patient taking differently: Take 150 mg by mouth daily.) 90 capsule 0 Past Month at Unknown time  . hydroxychloroquine (PLAQUENIL) 200 MG tablet Take 200 mg by mouth 2 (two) times daily.   01/17/2020 at Unknown time  . labetalol (NORMODYNE) 200 MG tablet TAKE 1 AND 1/2 TABLETS BY MOUTH TWICE A DAY. (Patient taking differently: Take 300 mg by mouth in the morning and at bedtime.) 90 tablet 11 01/17/2020 at Unknown time  . levothyroxine (SYNTHROID) 200 MCG tablet TAKE 1 TABLET (200 MCG TOTAL) BY MOUTH DAILY BEFORE BREAKFAST. 90 tablet 1 01/17/2020 at Unknown time  . Multiple Vitamins-Minerals (BARIATRIC MULTIVITAMINS/IRON) CAPS Take 1 tablet by mouth daily.   01/16/2020 at  Unknown time  . omeprazole (PRILOSEC) 20 MG capsule TAKE 1 CAPSULE (20 MG TOTAL) BY MOUTH 2 (TWO) TIMES DAILY BEFORE A MEAL. 180 capsule 1 01/17/2020 at Unknown time  . PARoxetine (PAXIL) 40 MG tablet Take 1 tablet (40 mg total) by mouth every morning. (Patient taking differently: Take 40 mg by mouth daily.) 90 tablet 3 01/17/2020 at Unknown time  . rosuvastatin (CRESTOR) 20 MG tablet Take 1 tablet (20 mg total) by mouth daily. (Patient taking differently: Take 20 mg by mouth every evening.) 30 tablet 11 01/16/2020 at Unknown time  . warfarin (COUMADIN) 7.5 MG tablet TAKE 1 & 1/2 TABLETS DAILY EXCEPT 1 TABLET ON MON AND THURS OR AS DIRECTED BY CLINIC 90 DAY SUPPLY (Patient taking differently: Take 11.25-15 mg by mouth See admin instructions. Take 1.5 tablets (11.25 mg) by mouth on Mondays, Tuesdays, Wednesdays, Thursdays, Fridays, & Saturdays. 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    Heparin level this afternoon slightly subtherapeutic, no overt bleeding or complications noted, no issues with IV infusion per RN.    PTA warfarin dosing 15 mg on Sundays, and 11.25 mg all other days.  Goal of Therapy:  Heparin level 0.3-0.7 units/ml Monitor platelets by anticoagulation protocol: Yes   Plan:  Increase IV heparin to 1500 units/hr. Repeat heparin level in 6 hrs. Daily heparin level and CBC. Asked by Dr. Rennis Golden to hold Coumadin tonight for possible cath tomorrow.  Reece Leader, Colon Flattery, B[facility] Clinical Pharmacist  01/18/2020 2:01 PM   [facility] pharmacy phone numbers are listed on [email or web address]

## 2020-01-18 NOTE — Consult Note (Signed)
Cardiology Consultation:   Patient ID: [name] MRN: [id number]; DOB: 03/23/1965  Admit date: 01/17/2020 Date of Consult: 01/18/2020  Primary Care Provider: Judy Pimpleower, Marne A, MD [facility] Cardiologist: Dietrich PatesPaula Ross, MD  [facility] Electrophysiologist:  None    Patient Profile:   Cheryl DenverLori A Price is a 54 y.o. female with a history of moderate aortic stenosis on Echo in 11/2018, recurrent pleural effusion with superinfection on left s/p VATS and surgical pleurodesis in 2010, Factor V Leiden with prior PE/DVT on Coumadin, intracerebral hemorrhage, lupus with nephritis, hypertension, hyperlipidemia, GERD, hypothyroidism, and morbid obesity s/p gastric sleeve in 2012 who is being seen today for the evaluation of aortic stenosis at the request of Dr. Benjamine MolaVann.  History of Present Illness:   [name] is a 54 year old with the above history who is followed by Dr. Tenny Crawoss. Patient was referred to Dr. Tenny Crawoss in 2016 for evaluation of murmur. Echo prior to this visit showed mild to moderate aortic stenosis. This has been monitored with serial Echos since that time. Last Echo in 11/2018 showed LVEF of 60-65% with mild LVH and moderate aortic stenosis with mean gradient of 33.1 mmHg.  Patient presented to the ED yesterday with sudden onset of chest pain. Patient reports she was in her usual state of health until yesterday morning when she developed left sided chest pain while drinking coffee. She describes the pain as someone twisting a knife. Pain radiating up to her shoulder and she states her arm felt heavy. Pain waxed and wanes for hours and finally went away this morning. No associated nausea, vomiting, or shortness of breath. She also notes dyspnea on exertion. Her body habitus likely contributes to this. She has a long history of GI problems. She had a gastric sleeve in 2012. For the past 2 year, she has had nausea and vomiting after eating. This has improved some recently. She follows with GI. 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 She has slept on an incline for a long time due to her GI symptoms. Difficult to tell whether she also has any orthopnea. She reports waking occasionally coughing up phlegm. Chronic lower extremity is stable. Occasional brief palpitations. No lightheadedness, dizziness, or syncope. No abnormal bleeding in urine or stools. She notes cough and nasal congestion recently but no fevers or sick contacts.  In the ED, vitals stable. EKG showed normal sinus rhythm with right axis deviation but no acute ischemic changes. High-sensitivity troponin negative x2. Chest x-ray showed large loculated pleural fluid collection at left posterior lung base with early calcification suggestive of possible chronic empyema. Chest CT showed chronic thick rimmed loculated pleural effusion stable since 2011. WBC 4.1, Hgb 11.0, Plts 209. Na 142, K 4.6, Glucose 95, BUN 12, Cr 1.03. Respiratory panel negative for COVID and influenza A/B. Patient was admitted for further evaluation. PCCM was consulted for further evaluation of chronic pleural effusion. No intervention felt needed. Echo was ordered and showed LVEF of 65-70% with mild to moderate LVH, grade 2 diastolic dysfunction, severe aortic stenosis with mean gradient of 46 mmHg, and small pericardial effusion around the right atrium. Cardiology was consulted for further evaluation.  At the time of this evaluation, patient is chest pain free.  She has a history of tobacco abuse but quit 2 years ago. Occasional alcohol use. No recreational drug use. She does have a family history of heart disease. Her paternal grandfather died from a MI in his 7040's to 7450's. Her maternal grandfather has multiple  MIs. Her maternal aunt had one of her valves replaced.  Past Medical History:  Diagnosis Date  . Arthritis    Lupus - hands/knees  . 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 Enlargement of lymph  nodes    Liden Factor V  . GERD (gastroesophageal reflux disease)    on prilosec r/t gastric sleeve surgery  . HLD (hyperlipidemia)   . Iron deficiency anemia    IV dextran [place]  . Nephritis and nephropathy, not specified as acute or chronic, with unspecified pathological lesion in kidney   . Nonspecific abnormal results of liver function study   . Obesity, unspecified   . Other specified acquired hypothyroidism   . Panic disorder without agoraphobia   . Personal history of venous thrombosis and embolism 2002   during pregnancy; ?factor 5 leiden (sees heme)  . Pleural effusion 2005   c/w lupus initial w/u; recurrent Right as pred tapered off July 2011  . Sleep apnea   . 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 Unspecified essential hypertension     Past Surgical History:  Procedure Laterality Date  . BIOPSY  04/12/2019   Procedure: BIOPSY;  Surgeon: Tressia Danas, MD;  Location: [facility];  Service: Gastroenterology;;  . ESOPHAGEAL MANOMETRY N/A 07/15/2019   Procedure: ESOPHAGEAL MANOMETRY (EM);  Surgeon: Napoleon Form, MD;  Location: [facility];  Service: Endoscopy;  Laterality: N/A;  . ESOPHAGOGASTRODUODENOSCOPY (EGD) WITH PROPOFOL N/A 04/12/2019   Procedure: ESOPHAGOGASTRODUODENOSCOPY (EGD) WITH PROPOFOL;  Surgeon: Tressia Danas, MD;  Location: [facility];  Service: Gastroenterology;  Laterality: N/A;  . gastric sleeve  8/12   bariatric surgery Dr Adolphus Birchwood   . LAPAROSCOPIC TUBAL LIGATION  12/09/2010   Procedure: LAPAROSCOPIC TUBAL LIGATION;  Surgeon: Juluis Mire;  Location: [facility];  Service: Gynecology;  Laterality: Bilateral;  Attempted see Nursing note  . pleurocentesis  10/2004  . Pleuryx catheter placement  9/11    VanTright----removed 9/11  . RENAL BIOPSY  09/24/2012  . svd     x 3  . THORACENTESIS  2010   with penumonia  . WISDOM TOOTH EXTRACTION       Home Medications:  Prior to Admission  medications   Medication Sig Start Date End Date Taking? Authorizing Provider  bismuth subsalicylate (PEPTO BISMOL) 262 MG/15ML suspension Take 30 mLs by mouth every 6 (six) hours as needed for indigestion or diarrhea or loose stools.   Yes [provider]  calcium carbonate (TUMS - DOSED IN MG ELEMENTAL CALCIUM) 500 MG chewable tablet Chew 1-2 tablets by mouth 3 (three) times daily as needed for indigestion or heartburn.   Yes [provider]  cyanocobalamin (,VITAMIN B-12,) 1000 MCG/ML injection Inject 1,000 mcg into the muscle every 30 (thirty) days.  10/06/19  Yes [provider]  FERREX 150 150 MG capsule TAKE 1 CAPSULE BY MOUTH EVERY DAY Patient taking differently: Take 150 mg by mouth daily. 07/28/19  Yes Tower, Audrie Gallus, MD  hydroxychloroquine (PLAQUENIL) 200 MG tablet Take 200 mg by mouth 2 (two) times daily.   Yes [provider]  labetalol (NORMODYNE) 200 MG tablet TAKE 1 AND 1/2 TABLETS BY MOUTH TWICE A DAY. Patient taking differently: Take 300 mg by mouth in the morning and at bedtime. 01/25/19  Yes Tower, Audrie Gallus, MD  levothyroxine (SYNTHROID) 200 MCG tablet TAKE 1 TABLET (200 MCG TOTAL) BY MOUTH DAILY BEFORE  BREAKFAST. 08/29/19  Yes Tower, Audrie Gallus, MD  Multiple Vitamins-Minerals (BARIATRIC MULTIVITAMINS/IRON) CAPS Take 1 tablet by mouth daily.   Yes [provider]  omeprazole (PRILOSEC) 20 MG capsule TAKE 1 CAPSULE (20 MG TOTAL) BY MOUTH 2 (TWO) TIMES DAILY BEFORE A MEAL. 10/24/19  Yes Tressia Danas, MD  PARoxetine (PAXIL) 40 MG tablet Take 1 tablet (40 mg total) by mouth every morning. Patient taking differently: Take 40 mg by mouth daily. 01/26/19  Yes Tower, Audrie Gallus, MD  rosuvastatin (CRESTOR) 20 MG tablet Take 1 tablet (20 mg total) by mouth daily. Patient taking differently: Take 20 mg by mouth every evening. 01/25/19  Yes Tower, Audrie Gallus, MD  warfarin (COUMADIN) 7.5 MG tablet TAKE 1 & 1/2 TABLETS DAILY EXCEPT 1 TABLET ON MON AND THURS  OR AS DIRECTED BY CLINIC 90 DAY SUPPLY Patient taking differently: Take 11.25-15 mg by mouth See admin instructions. Take 1.5 tablets (11.25 mg) by mouth on Mondays, Tuesdays, Wednesdays, Thursdays, Fridays, & Saturdays. Take 2 tablets (15 mg) by mouth on Sundays ONLY. 03/01/19  Yes Tower, Audrie Gallus, MD    Inpatient Medications: Scheduled Meds: . calcium carbonate  1 tablet Oral TID WC  . docusate sodium  100 mg Oral BID  . hydroxychloroquine  200 mg Oral BID  . labetalol  300 mg Oral BID  . levothyroxine  200 mcg Oral QAC breakfast  . multivitamin with minerals  1 tablet Oral BID  . pantoprazole  40 mg Oral BID  . PARoxetine  40 mg Oral q morning - 10a  . Ensure Max Protein  11 oz Oral Daily  . rosuvastatin  20 mg Oral QPM  . sodium chloride flush  3 mL Intravenous Q12H  . warfarin  15 mg Oral ONCE-1600  . Warfarin - Pharmacist Dosing Inpatient   Does not apply q1600   Continuous Infusions: . heparin 1,500 Units/hr (01/18/20 1357)   PRN Meds: acetaminophen **OR** acetaminophen, bisacodyl, hydrALAZINE, HYDROcodone-acetaminophen, morphine injection, ondansetron **OR** ondansetron (ZOFRAN) IV, polyethylene glycol  Allergies:    Allergies  Allergen Reactions  . Lisinopril Cough  . Lovenox [Enoxaparin Sodium] Itching  . Moxifloxacin Other (See Comments)    REACTION: hallucinations  . Lovenox [Enoxaparin]     Itching     Social History:   Social History   Socioeconomic History  . Marital status: Divorced    Spouse name: Not on file  . Number of children: 3  . Years of education: Not on file  . Highest education level: Not on file  Occupational History  . Occupation: Psychologist, educational at [employer]  . Smoking status: Former Smoker    Packs/day: 0.50    Years: 10.00    Pack years: 5.00    Types: Cigarettes    Quit date: 05/04/2009    Years since quitting: 10.7  . Smokeless tobacco: Never Used  . Tobacco comment: Socially x10 years (1 pack/month)  Vaping Use  .  Vaping Use: Never used  Substance and Sexual Activity  . Alcohol use: Yes    Alcohol/week: 0.0 standard drinks    Comment: rare  . Drug use: No  . Sexual activity: Not on file  Other Topics Concern  . Not on file  Social History Narrative  . Not on file   Social Determinants of Health   Financial Resource Strain: Not on file  Food Insecurity: Not on file  Transportation Needs: Not on file  Physical Activity: Not on file  Stress: Not on file  Social Connections: Not on file  Intimate Partner Violence: Not on file    Family History:    Family History  Problem Relation Age of Onset  . Lupus Sister   . Hypertension Father   . Hyperlipidemia Father   . Leukemia Sister   . Diabetes Other        GM     ROS:  Please see the history of present illness.  All other ROS reviewed and negative.     Physical Exam/Data:   Vitals:   01/18/20 0900 01/18/20 0912 01/18/20 1143 01/18/20 1200  BP:  130/77 120/74   Pulse: 80 85 76 80  Resp: 18 17 (!) 22   Temp: 98.6 F (37 C)  98.4 F (36.9 C)   TempSrc: Oral  Oral   SpO2: 92% 93%    Weight:      Height:       No intake or output data in the 24 hours ending 01/18/20 1458 Last 3 Weights 01/17/2020 01/17/2020 05/12/2019  Weight (lbs) 314 lb 9.5 oz 300 lb 323 lb  Weight (kg) 142.7 kg 136.079 kg 146.512 kg     Body mass index is 52.35 kg/m.   General: 54 y.o. female resting comfortably in no acute distress. HEENT: Normocephalic and atraumatic. Sclera clear.  Neck: Supple. Bilateral carotid bruits (likely radiation from aortic stenosis murmur). JVD difficult to assess due to body habitus Heart: RRR. Distinct S1 and S2. III/VI systolic murmur. No gallops or rubs. Radial pulses 2+ and equal bilaterally. Lungs: No increased work of breathing. Clear to ausculation bilaterally. No wheezes, rhonchi, or rales.  Abdomen: Soft, non-distended, and non-tender to palpation. Bowel sounds present. MSK: Normal strength and tone for  age. Extremities: No lower extremity edema. Chronic hyperpigmentation of right anterior lower extremity.  Skin: Warm and dry. Neuro: Alert and oriented x3. No focal deficits. Psych: Normal affect. Responds appropriately.   EKG:  The EKG was personally reviewed and demonstrates: Normal sinus rhythm, rate 85 bpm, with slight right axis deviation but no acute ST/T changes. Normal PR and QRS intervals. QTc 449 ms.   Telemetry:  Telemetry was personally reviewed and demonstrates:  Normal sinus rhythm with rates in the 70's to 80's. Short run of non-sustained VT (6 beats).  Relevant CV Studies:  Echocardiogram 11/22/2018: Impressions: 1. Left ventricular ejection fraction, by estimation, is 65 to 70%. The  left ventricle has normal function. The left ventricle has no regional  wall motion abnormalities. There is mild-to-moderate concentric left  ventricular hypertrophy. Left ventricular  diastolic parameters are consistent with Grade II diastolic dysfunction  (pseudonormalization).  2. Right ventricular systolic function is normal. The right ventricular  size is normal.  3. A small pericardial effusion appears to be present around the RA  although visualization is limited.  4. The mitral valve is normal in structure. Trivial mitral valve  regurgitation.  5. The aortic valve is tricuspid. There is moderate calcification of the  aortic valve. There is moderate thickening of the aortic valve. There is  severe aortic valve stenosis with a mean gradient of , peak gradient  71.43mmHg, peak velocity 4.2 m/s,  AVA 0.9cm2 by continuity and DOI 0.26  6. The inferior vena cava is normal in size with greater than 50%  respiratory variability, suggesting right atrial pressure of 3 mmHg.   Comparison(s): Compared to prior echo on 11/22/2018, the aortic stenosis  is now severe.   Laboratory Data:  High Sensitivity Troponin:   Recent Labs  Lab  01/17/20 1002 01/17/20 1157   TROPONINIHS 7 6     Chemistry Recent Labs  Lab 01/17/20 1002 01/18/20 0248  NA 142 138  K 4.6 5.0  CL 104 104  CO2 24 24  GLUCOSE 95 102*  BUN 12 13  CREATININE 1.03* 1.01*  CALCIUM 9.9 8.7*  GFRNONAA >60 >60  ANIONGAP 14 10    No results for input(s): PROT, ALBUMIN, AST, ALT, ALKPHOS, BILITOT in the last 168 hours. Hematology Recent Labs  Lab 01/17/20 1002 01/18/20 0248  WBC 4.1 4.5  RBC 4.73 4.11  HGB 11.0* 10.0*  HCT 38.6 32.4*  MCV 81.6 78.8*  MCH 23.3* 24.3*  MCHC 28.5* 30.9  RDW 16.0* 16.2*  PLT 209 PLATELET CLUMPS NOTED ON SMEAR, UNABLE TO ESTIMATE   BNPNo results for input(s): BNP, PROBNP in the last 168 hours.  DDimer No results for input(s): DDIMER in the last 168 hours.   Radiology/Studies:  DG Chest 2 View  Result Date: 01/17/2020 CLINICAL DATA:  Left chest pain. EXAM: CHEST - 2 VIEW COMPARISON:  CT chest 09/20/2009.  Chest x-ray 09/25/2010. FINDINGS: Large pleural based density left posterior lung base similar but slightly smaller in size compared to the prior study. Mild calcification in the wall of this density has developed in the interval. Probable chronic loculated pleural effusion. There is compressive atelectasis in the left lower lobe Right lung clear.  Negative for heart failure or edema. IMPRESSION: Large loculated pleural fluid collection left posterior lung base with early calcification. Possible chronic empyema. CT chest with contrast may be helpful if the patient has acute symptoms. Electronically Signed   By: Marlan Palau M.D.   On: 01/17/2020 10:30   CT Chest W Contrast  Result Date: 01/17/2020 CLINICAL DATA:  Left chest pain, left arm pain EXAM: CT CHEST WITH CONTRAST TECHNIQUE: Multidetector CT imaging of the chest was performed during intravenous contrast administration. CONTRAST:  75mL OMNIPAQUE IOHEXOL 300 MG/ML  SOLN COMPARISON:  Chest x-ray today FINDINGS: Cardiovascular: Heart is normal size. Aorta is normal caliber. Coronary  artery and aortic atherosclerosis. Mediastinum/Nodes: No mediastinal, hilar, or axillary adenopathy. Thyroid unremarkable. Esophagus is fluid-filled. Small hiatal hernia. Lungs/Pleura: There is a loculated left pleural effusion noted at the left lower chest. This is stable dating back to 2011 except for rim calcifications now present compatible with chronic process. This measures 13.2 x 8.5 cm. Compressive atelectasis or scarring adjacent to the left effusion. No effusion on the right. Areas of scarring in the right mid and lower lung. Upper Abdomen: Imaging into the upper abdomen demonstrates no acute findings. Postoperative changes in the stomach from apparent prior gastric sleeve. Musculoskeletal: Chest wall soft tissues are unremarkable. No acute bony abnormality. IMPRESSION: Chronic thick rimmed loculated left pleural effusion is stable since 2011. The thick rind is now partially calcified. Adjacent compressive atelectasis or scarring in the left lower lobe. Fluid-filled esophagus which may be related to reflux or dysmotility. Small hiatal hernia. Coronary artery disease. Aortic Atherosclerosis (ICD10-I70.0). Electronically Signed   By: Charlett Nose M.D.   On: 01/17/2020 19:30   ECHOCARDIOGRAM LIMITED  Result Date: 01/17/2020    ECHOCARDIOGRAM LIMITED REPORT   Patient Name:   [name] Date of Exam: 01/17/2020 Medical Rec #:  [id number]      Height: Accession #:    [id number]     Weight: Date of Birth:  1965/03/18       BSA: Patient Age:    22 years  BP:           131/74 mmHg Patient Gender: F              HR:           79 bpm. Exam Location:  Inpatient Procedure: Limited Echo, Limited Color Doppler and Cardiac Doppler STAT ECHO Indications:    chest pain  History:        Patient has prior history of Echocardiogram examinations, most                 recent 11/22/2018. Chronic kidney disease. lupus.,                 Signs/Symptoms:Murmur; Risk Factors:Dyslipidemia.  Sonographer:    Delcie Roch  Referring Phys: [id number] JENNIFER YATES IMPRESSIONS  1. Left ventricular ejection fraction, by estimation, is 65 to 70%. The left ventricle has normal function. The left ventricle has no regional wall motion abnormalities. There is mild-to-moderate concentric left ventricular hypertrophy. Left ventricular  diastolic parameters are consistent with Grade II diastolic dysfunction (pseudonormalization).  2. Right ventricular systolic function is normal. The right ventricular size is normal.  3. A small pericardial effusion appears to be present around the RA although visualization is limited.  4. The mitral valve is normal in structure. Trivial mitral valve regurgitation.  5. The aortic valve is tricuspid. There is moderate calcification of the aortic valve. There is moderate thickening of the aortic valve. There is severe aortic valve stenosis with a mean gradient of , peak gradient 71.62mmHg, peak velocity 4.2 m/s,  AVA 0.9cm2 by continuity and DOI 0.26  6. The inferior vena cava is normal in size with greater than 50% respiratory variability, suggesting right atrial pressure of 3 mmHg. Comparison(s): Compared to prior echo on 11/22/2018, the aortic stenosis is now severe. FINDINGS  Left Ventricle: Left ventricular ejection fraction, by estimation, is 65 to 70%. The left ventricle has normal function. The left ventricle has no regional wall motion abnormalities. The left ventricular internal cavity size was normal in size. There is  mild-to-moderate concentric left ventricular hypertrophy. Left ventricular diastolic parameters are consistent with Grade II diastolic dysfunction (pseudonormalization). Right Ventricle: The right ventricular size is normal. Right ventricular systolic function is normal. Left Atrium: Left atrial size was normal in size. Right Atrium: Right atrial size was normal in size. Pericardium: A small pericardial effusion appears to be present around the RA although visualization is limited. Mitral  Valve: The mitral valve is normal in structure. There is mild thickening of the mitral valve leaflet(s). There is mild calcification of the mitral valve leaflet(s). Mild mitral annular calcification. Trivial mitral valve regurgitation. Tricuspid Valve: The tricuspid valve is normal in structure. Tricuspid valve regurgitation is trivial. Aortic Valve: The aortic valve is tricuspid. There is moderate calcification of the aortic valve. There is moderate thickening of the aortic valve. Severe aortic stenosis is present. Aortic valve mean gradient measures 46.0 mmHg. Aortic valve peak gradient measures 71.6 mmHg. Aortic valve area, by VTI measures 0.90 cm. DOI 0.26 Pulmonic Valve: The pulmonic valve was normal in structure. Pulmonic valve regurgitation is not visualized. Aorta: The aortic root and ascending aorta are structurally normal, with no evidence of dilitation. Venous: The inferior vena cava is normal in size with greater than 50% respiratory variability, suggesting right atrial pressure of 3 mmHg. IAS/Shunts: No atrial level shunt detected by color flow Doppler. LEFT VENTRICLE PLAX 2D LVIDd:         4.80 cm LVIDs:  2.90 cm LV PW:         1.20 cm LV IVS:        1.10 cm LVOT diam:     2.10 cm LV SV:         83 LVOT Area:     3.46 cm  AORTIC VALVE AV Area (Vmax):    0.79 cm AV Area (VTI):     0.90 cm AV Vmax:           423.00 cm/s AV VTI:            0.923 m AV Peak Grad:      71.6 mmHg AV Mean Grad:      46.0 mmHg LVOT Vmax:         96.90 cm/s LVOT VTI:          0.241 m LVOT/AV VTI ratio: 0.26  AORTA Ao Root diam: 3.10 cm Ao Asc diam:  3.30 cm MV E velocity: 112.00 cm/s MV A velocity: 127.00 cm/s  SHUNTS MV E/A ratio:  0.88         Systemic VTI:  0.24 m                             Systemic Diam: 2.10 cm Laurance Flatten MD Electronically signed by Laurance Flatten MD Signature Date/Time: 01/17/2020/6:08:26 PM    Final      Assessment and Plan:   Severe Aortic Stenosis - Patient presented with  chest pain and found to have progression of her aortic stenosis. - Echo showed normal LV function with severe aortic stenosis (mean gradient 46 mmHg, peak gradient 71.6 mmHg). Progressed from moderate gradients in 11/2018. - Will plan for right/left cardiac catheterization. The patient understands that risks include but are not limited to stroke (1 in 1000), death (1 in 1000), kidney failure [usually temporary] (1 in 500), bleeding (1 in 200), allergic reaction [possibly serious] (1 in 200), and agrees to proceed. Will notify Pharmacy of plan for cath so they can help with Coumadin/Heparin.  Chest Pain - EKG shows no acute ischemic changes. - High-sensitivity troponin negative x2.  - Currently chest pain free. - Chest pain sounds non-cardiac in nature. Likely GI. However, will assess with right/left heart cath given aortic stenosis as stated above.  Chronic Left Pleural Effusion History of Recurrent Pleural Effusions with Superinfection on Left s/p VATS and Surgical Pleurodesis in 2010 - CT this admission showed chronic thick rimmed loculated pleural effusion stable since 2011. - PCCM was consulted and did not feel like any intervention was needed.  Hypertension - BP well controlled. - Continue home Labetalol.  Hyperlipidemia - Continue home Crestor 20mg  daily.  Otherwise, per primary team. - Factor V Leiden with prior PE/DVT: On Coumadin at home. On Heparin here.  - Lupus with prior nephritis - Hypothyroidism - GERD  HEAR Score (for undifferentiated chest pain):  HEAR Score: 3   For questions or updates, please contact [facility] Please consult [email or web address] for contact info under    Signed, Corrin Parker, PA-C  01/18/2020 2:58 PM

## 2020-01-18 NOTE — Progress Notes (Signed)
Progress Note    [name]  LAG:[id number] DOB: 03-09-1965  DOA: 01/17/2020 PCP: Judy Pimple, MD    Brief Narrative:     Medical records reviewed and are as summarized below:  [name] is an 54 y.o. female with medical history significant of HTN; HLE; OSA; HLD;  VTE; panic d/o; hypothyroidism; morbid obesity presenting with chest pain.  This AM, she awoke to L-sided chest/breast pain.  It was consistent.  Her arm felt different, somewhat heavy.  The pain would feel like a twinge, lasting a minute or 2 and then dissipate but would come back every 5-7 minutes and kept bothering her.  Not exertional, just on a consistent pattern.    Assessment/Plan:   Principal Problem:   Chest pain of uncertain etiology Active Problems:   Hypothyroidism   HYPERCHOLESTEROLEMIA   Morbid obesity (HCC)   Essential hypertension   Systemic lupus erythematosus (HCC)   OSA on CPAP   Factor V Leiden (HCC)   Empyema lung (HCC)   Chest pain- GI vs cardiac - worsening AS seen on echo- cards consult -PPI BID, was to follow up with General Surgery for: consideration of diverticulectomy or diverticulopexy-has GI follow up 12/23  L lung empyema- chronic and stable -pulmonary consult appreciated  SLE/Factor V Leiden -coumadin per pharmacy -daily INR -Continue Plaquenil for now  HTN -Continue Labetalol  HLD -Continue Crestor  Anxiety -Continue Paxil  Hypothyroidism -TSH WNL -Continue Synthroid at current dose for now  OSA on CPAP -Continue CPAP QHS  Obesity Body mass index is 52.35 kg/m.   Family Communication/Anticipated D/C date and plan/Code Status   DVT prophylaxis: heparin/coumadin Code Status: Full Code.  Disposition Plan: Status is: Inpatient  Remains inpatient appropriate because:Inpatient level of care appropriate due to severity of illness   Dispo: The patient is from: Home              Anticipated d/c is to: Home              Anticipated d/c  date is: 2 days              Patient currently is not medically stable to d/c.         Medical Consultants:    pulm  cards     Subjective:   For 6 months has been having worsening SOB/ability to do tasks Does not think she ever saw GS  Objective:    Vitals:   01/18/20 0402 01/18/20 0900 01/18/20 0912 01/18/20 1143  BP: 116/77  130/77 120/74  Pulse: 81 80 85 76  Resp: 20 18 17  (!) 22  Temp: 97.7 F (36.5 C) 98.6 F (37 C)  98.4 F (36.9 C)  TempSrc: Oral Oral  Oral  SpO2: 95% 92% 93%   Weight:      Height:       No intake or output data in the 24 hours ending 01/18/20 1337 Filed Weights   01/17/20 1900 01/17/20 2046  Weight: 136.1 kg (!) 142.7 kg    Exam:  General: Appearance:    Severely obese female in no acute distress     Lungs:     Clear to auscultation bilaterally, respirations unlabored  Heart:    Normal heart rate. Normal rhythm.  +mutmur  MS:   All extremities are intact.   Neurologic:   Awake, alert, oriented x 3. No apparent focal neurological           defect.  Data Reviewed:   I have personally reviewed following labs and imaging studies:  Labs: Labs show the following:   Basic Metabolic Panel: Recent Labs  Lab 01/17/20 1002 01/18/20 0248  NA 142 138  K 4.6 5.0  CL 104 104  CO2 24 24  GLUCOSE 95 102*  BUN 12 13  CREATININE 1.03* 1.01*  CALCIUM 9.9 8.7*   GFR Estimated Creatinine Clearance: 92.8 mL/min (A) (by C-G formula based on SCr of 1.01 mg/dL (H)). Liver Function Tests: No results for input(s): AST, ALT, ALKPHOS, BILITOT, PROT, ALBUMIN in the last 168 hours. No results for input(s): LIPASE, AMYLASE in the last 168 hours. No results for input(s): AMMONIA in the last 168 hours. Coagulation profile Recent Labs  Lab 01/17/20 1509 01/18/20 0248  INR 1.3* 1.2    CBC: Recent Labs  Lab 01/17/20 1002 01/18/20 0248  WBC 4.1 4.5  HGB 11.0* 10.0*  HCT 38.6 32.4*  MCV 81.6 78.8*  PLT 209 PLATELET CLUMPS NOTED  ON SMEAR, UNABLE TO ESTIMATE   Cardiac Enzymes: No results for input(s): CKTOTAL, CKMB, CKMBINDEX, TROPONINI in the last 168 hours. BNP (last 3 results) No results for input(s): PROBNP in the last 8760 hours. CBG: No results for input(s): GLUCAP in the last 168 hours. D-Dimer: No results for input(s): DDIMER in the last 72 hours. Hgb A1c: No results for input(s): HGBA1C in the last 72 hours. Lipid Profile: No results for input(s): CHOL, HDL, LDLCALC, TRIG, CHOLHDL, LDLDIRECT in the last 72 hours. Thyroid function studies: Recent Labs    01/18/20 0248  TSH 0.420   Anemia work up: No results for input(s): VITAMINB12, FOLATE, FERRITIN, TIBC, IRON, RETICCTPCT in the last 72 hours. Sepsis Labs: Recent Labs  Lab 01/17/20 1002 01/18/20 0248  WBC 4.1 4.5    Microbiology Recent Results (from the past 240 hour(s))  Resp Panel by RT-PCR (Flu A&B, Covid) Nasopharyngeal Swab     Status: None   Collection Time: 01/17/20  3:37 PM   Specimen: Nasopharyngeal Swab; Nasopharyngeal(NP) swabs in vial transport medium  Result Value Ref Range Status   SARS Coronavirus 2 by RT PCR NEGATIVE NEGATIVE Final    Comment: (NOTE) SARS-CoV-2 target nucleic acids are NOT DETECTED.  The SARS-CoV-2 RNA is generally detectable in upper respiratory specimens during the acute phase of infection. The lowest concentration of SARS-CoV-2 viral copies this assay can detect is 138 copies/mL. A negative result does not preclude SARS-Cov-2 infection and should not be used as the sole basis for treatment or other patient management decisions. A negative result may occur with  improper specimen collection/handling, submission of specimen other than nasopharyngeal swab, presence of viral mutation(s) within the areas targeted by this assay, and inadequate number of viral copies(<138 copies/mL). A negative result must be combined with clinical observations, patient history, and epidemiological information. The  expected result is Negative.  Fact Sheet for Patients:  BloggerCourse.com  Fact Sheet for Healthcare Providers:  SeriousBroker.it  This test is no t yet approved or cleared by the [place] FDA and  has been authorized for detection and/or diagnosis of SARS-CoV-2 by FDA under an Emergency Use Authorization (EUA). This EUA will remain  in effect (meaning this test can be used) for the duration of the COVID-19 declaration under Section 564(b)(1) of the Act, 21 U.S.C.section 360bbb-3(b)(1), unless the authorization is terminated  or revoked sooner.       Influenza A by PCR NEGATIVE NEGATIVE Final   Influenza B by PCR NEGATIVE NEGATIVE Final  Comment: (NOTE) The Xpert Xpress SARS-CoV-2/FLU/RSV plus assay is intended as an aid in the diagnosis of influenza from Nasopharyngeal swab specimens and should not be used as a sole basis for treatment. Nasal washings and aspirates are unacceptable for Xpert Xpress SARS-CoV-2/FLU/RSV testing.  Fact Sheet for Patients: BloggerCourse.comhttps://www.fda.gov/media/152166/download  Fact Sheet for Healthcare Providers: SeriousBroker.ithttps://www.fda.gov/media/152162/download  This test is not yet approved or cleared by the [place] FDA and has been authorized for detection and/or diagnosis of SARS-CoV-2 by FDA under an Emergency Use Authorization (EUA). This EUA will remain in effect (meaning this test can be used) for the duration of the COVID-19 declaration under Section 564(b)(1) of the Act, 21 U.S.C. section 360bbb-3(b)(1), unless the authorization is terminated or revoked.  Performed at [facility], [address] [place], [place] [address]   MRSA PCR Screening     Status: None   Collection Time: 01/17/20  8:49 PM   Specimen: Nasal Mucosa; Nasopharyngeal  Result Value Ref Range Status   MRSA by PCR NEGATIVE NEGATIVE Final    Comment:        The GeneXpert MRSA Assay (FDA approved for NASAL  specimens only), is one component of a comprehensive MRSA colonization surveillance program. It is not intended to diagnose MRSA infection nor to guide or monitor treatment for MRSA infections. Performed at [facility], [address] [place], [place] [address]     Procedures and diagnostic studies:  DG Chest 2 View  Result Date: 01/17/2020 CLINICAL DATA:  Left chest pain. EXAM: CHEST - 2 VIEW COMPARISON:  CT chest 09/20/2009.  Chest x-ray 09/25/2010. FINDINGS: Large pleural based density left posterior lung base similar but slightly smaller in size compared to the prior study. Mild calcification in the wall of this density has developed in the interval. Probable chronic loculated pleural effusion. There is compressive atelectasis in the left lower lobe Right lung clear.  Negative for heart failure or edema. IMPRESSION: Large loculated pleural fluid collection left posterior lung base with early calcification. Possible chronic empyema. CT chest with contrast may be helpful if the patient has acute symptoms. Electronically Signed   By: Marlan Palauharles  Clark M.D.   On: 01/17/2020 10:30   CT Chest W Contrast  Result Date: 01/17/2020 CLINICAL DATA:  Left chest pain, left arm pain EXAM: CT CHEST WITH CONTRAST TECHNIQUE: Multidetector CT imaging of the chest was performed during intravenous contrast administration. CONTRAST:  75mL OMNIPAQUE IOHEXOL 300 MG/ML  SOLN COMPARISON:  Chest x-ray today FINDINGS: Cardiovascular: Heart is normal size. Aorta is normal caliber. Coronary artery and aortic atherosclerosis. Mediastinum/Nodes: No mediastinal, hilar, or axillary adenopathy. Thyroid unremarkable. Esophagus is fluid-filled. Small hiatal hernia. Lungs/Pleura: There is a loculated left pleural effusion noted at the left lower chest. This is stable dating back to 2011 except for rim calcifications now present compatible with chronic process. This measures 13.2 x 8.5 cm. Compressive atelectasis or  scarring adjacent to the left effusion. No effusion on the right. Areas of scarring in the right mid and lower lung. Upper Abdomen: Imaging into the upper abdomen demonstrates no acute findings. Postoperative changes in the stomach from apparent prior gastric sleeve. Musculoskeletal: Chest wall soft tissues are unremarkable. No acute bony abnormality. IMPRESSION: Chronic thick rimmed loculated left pleural effusion is stable since 2011. The thick rind is now partially calcified. Adjacent compressive atelectasis or scarring in the left lower lobe. Fluid-filled esophagus which may be related to reflux or dysmotility. Small hiatal hernia. Coronary artery disease. Aortic Atherosclerosis (ICD10-I70.0).  Electronically Signed   By: Charlett Nose M.D.   On: 01/17/2020 19:30   ECHOCARDIOGRAM LIMITED  Result Date: 01/17/2020    ECHOCARDIOGRAM LIMITED REPORT   Patient Name:   VIANKA ERTEL Date of Exam: 01/17/2020 Medical Rec #:  [id number]      Height: Accession #:    [id number]     Weight: Date of Birth:  [birth year]       BSA: Patient Age:    54 years       BP:           131/74 mmHg Patient Gender: F              HR:           79 bpm. Exam Location:  Inpatient Procedure: Limited Echo, Limited Color Doppler and Cardiac Doppler STAT ECHO Indications:    chest pain  History:        Patient has prior history of Echocardiogram examinations, most                 recent 11/22/2018. Chronic kidney disease. lupus.,                 Signs/Symptoms:Murmur; Risk Factors:Dyslipidemia.  Sonographer:    Delcie Roch Referring Phys: [id number] JENNIFER YATES IMPRESSIONS  1. Left ventricular ejection fraction, by estimation, is 65 to 70%. The left ventricle has normal function. The left ventricle has no regional wall motion abnormalities. There is mild-to-moderate concentric left ventricular hypertrophy. Left ventricular  diastolic parameters are consistent with Grade II diastolic dysfunction (pseudonormalization).  2. Right ventricular  systolic function is normal. The right ventricular size is normal.  3. A small pericardial effusion appears to be present around the RA although visualization is limited.  4. The mitral valve is normal in structure. Trivial mitral valve regurgitation.  5. The aortic valve is tricuspid. There is moderate calcification of the aortic valve. There is moderate thickening of the aortic valve. There is severe aortic valve stenosis with a mean gradient of , peak gradient 71.64mmHg, peak velocity 4.2 m/s,  AVA 0.9cm2 by continuity and DOI 0.26  6. The inferior vena cava is normal in size with greater than 50% respiratory variability, suggesting right atrial pressure of 3 mmHg. Comparison(s): Compared to prior echo on 11/22/2018, the aortic stenosis is now severe. FINDINGS  Left Ventricle: Left ventricular ejection fraction, by estimation, is 65 to 70%. The left ventricle has normal function. The left ventricle has no regional wall motion abnormalities. The left ventricular internal cavity size was normal in size. There is  mild-to-moderate concentric left ventricular hypertrophy. Left ventricular diastolic parameters are consistent with Grade II diastolic dysfunction (pseudonormalization). Right Ventricle: The right ventricular size is normal. Right ventricular systolic function is normal. Left Atrium: Left atrial size was normal in size. Right Atrium: Right atrial size was normal in size. Pericardium: A small pericardial effusion appears to be present around the RA although visualization is limited. Mitral Valve: The mitral valve is normal in structure. There is mild thickening of the mitral valve leaflet(s). There is mild calcification of the mitral valve leaflet(s). Mild mitral annular calcification. Trivial mitral valve regurgitation. Tricuspid Valve: The tricuspid valve is normal in structure. Tricuspid valve regurgitation is trivial. Aortic Valve: The aortic valve is tricuspid. There is moderate calcification of  the aortic valve. There is moderate thickening of the aortic valve. Severe aortic stenosis is present. Aortic valve mean gradient measures 46.0 mmHg. Aortic valve peak gradient measures 71.6 mmHg. Aortic  valve area, by VTI measures 0.90 cm. DOI 0.26 Pulmonic Valve: The pulmonic valve was normal in structure. Pulmonic valve regurgitation is not visualized. Aorta: The aortic root and ascending aorta are structurally normal, with no evidence of dilitation. Venous: The inferior vena cava is normal in size with greater than 50% respiratory variability, suggesting right atrial pressure of 3 mmHg. IAS/Shunts: No atrial level shunt detected by color flow Doppler. LEFT VENTRICLE PLAX 2D LVIDd:         4.80 cm LVIDs:         2.90 cm LV PW:         1.20 cm LV IVS:        1.10 cm LVOT diam:     2.10 cm LV SV:         83 LVOT Area:     3.46 cm  AORTIC VALVE AV Area (Vmax):    0.79 cm AV Area (VTI):     0.90 cm AV Vmax:           423.00 cm/s AV VTI:            0.923 m AV Peak Grad:      71.6 mmHg AV Mean Grad:      46.0 mmHg LVOT Vmax:         96.90 cm/s LVOT VTI:          0.241 m LVOT/AV VTI ratio: 0.26  AORTA Ao Root diam: 3.10 cm Ao Asc diam:  3.30 cm MV E velocity: 112.00 cm/s MV A velocity: 127.00 cm/s  SHUNTS MV E/A ratio:  0.88         Systemic VTI:  0.24 m                             Systemic Diam: 2.10 cm Laurance Flatten MD Electronically signed by Laurance Flatten MD Signature Date/Time: 01/17/2020/6:08:26 PM    Final     Medications:   . calcium carbonate  1 tablet Oral TID WC  . docusate sodium  100 mg Oral BID  . hydroxychloroquine  200 mg Oral BID  . labetalol  300 mg Oral BID  . levothyroxine  200 mcg Oral QAC breakfast  . multivitamin with minerals  1 tablet Oral BID  . pantoprazole  40 mg Oral BID  . PARoxetine  40 mg Oral q morning - 10a  . Ensure Max Protein  11 oz Oral Daily  . rosuvastatin  20 mg Oral QPM  . sodium chloride flush  3 mL Intravenous Q12H  . Warfarin - Pharmacist Dosing  Inpatient   Does not apply q1600   Continuous Infusions: . heparin 1,350 Units/hr (01/18/20 1107)  . lactated ringers 75 mL/hr at 01/18/20 1117     LOS: 1 day   Joseph Art  [facility]   How to contact the [facility] Attending or Consulting provider 7A - 7P or covering provider during after hours 7P -7A, for this patient?  1. Check the care team in [facility] and look for a) attending/consulting [facility] provider listed and b) the [facility] team listed 2. Log into [email or web address] and use [facility] universal password to access. If you do not have the password, please contact the hospital operator. 3. Locate the [facility] provider you are looking for under [facility] and page to a number that you can be directly reached. 4. If you still have difficulty reaching the provider, please page the [facility] (Director  on Call) for the Hospitalists listed on amion for assistance.  01/18/2020, 1:37 PM

## 2020-01-18 NOTE — Progress Notes (Signed)
ANTICOAGULATION CONSULT NOTE   Pharmacy Consult for heparin, coumadin Indication: DVT  Allergies  Allergen Reactions  . Lisinopril Cough  . Lovenox [Enoxaparin Sodium] Itching  . Moxifloxacin Other (See Comments)    REACTION: hallucinations  . Lovenox [Enoxaparin]     Itching     Patient Measurements: Height: 5\' 5"  (165.1 cm) Weight: (!) 142.7 kg (314 lb 9.5 oz) IBW/kg (Calculated) : 57 Heparin Dosing Weight: 92.7 kg  Vital Signs: Temp: 99.2 F (37.3 C) (12/15 1941) Temp Source: Oral (12/15 1941) BP: 134/80 (12/15 1941) Pulse Rate: 85 (12/15 1941)  Labs: Recent Labs    01/17/20 1002 01/17/20 1157 01/17/20 1509 01/18/20 0248 01/18/20 1056 01/18/20 1932  HGB 11.0*  --   --  10.0*  --   --   HCT 38.6  --   --  32.4*  --   --   PLT 209  --   --  PLATELET CLUMPS NOTED ON SMEAR, UNABLE TO ESTIMATE  --   --   LABPROT  --   --  15.4* 14.7  --   --   INR  --   --  1.3* 1.2  --   --   HEPARINUNFRC  --   --   --  0.32 0.20* 0.23*  CREATININE 1.03*  --   --  1.01*  --   --   TROPONINIHS 7 6  --   --   --   --     Estimated Creatinine Clearance: 92.8 mL/min (A) (by C-G formula based on SCr of 1.01 mg/dL (H)).   Medical History: Past Medical History:  Diagnosis Date  . Arthritis    Lupus - hands/knees  . 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 GERD (gastroesophageal reflux disease)    on prilosec r/t gastric sleeve surgery  . HLD (hyperlipidemia)   . Iron deficiency anemia    IV dextran [place]  . Nephritis and nephropathy, not specified as acute or chronic, with unspecified pathological lesion in kidney   . Nonspecific abnormal results of liver function study   . Obesity, unspecified   . Other specified acquired hypothyroidism   . Panic disorder without agoraphobia   . Personal history of venous thrombosis and embolism 2002   during pregnancy; ?factor 5  leiden (sees heme)  . Pleural effusion 2005   c/w lupus initial w/u; recurrent Right as pred tapered off July 2011  . Sleep apnea   . 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 Unspecified essential hypertension     Medications:  Facility-Administered Medications Prior to Admission  Medication Dose Route Frequency Provider Last Rate Last Admin  . cyanocobalamin ((VITAMIN B-12)) injection 1,000 mcg  1,000 mcg Intramuscular Q30 days [place], 02-13-1996 A, MD   1,000 mcg at 12/22/19 1151  . cyanocobalamin ((VITAMIN B-12)) injection 1,000 mcg  1,000 mcg Intramuscular Once 12/24/19, MD       Medications Prior to Admission  Medication Sig Dispense Refill Last Dose  . bismuth subsalicylate (PEPTO BISMOL) 262 MG/15ML suspension Take 30 mLs by mouth every 6 (six) hours as needed for indigestion or diarrhea or loose stools.   Past Week at Unknown time  . calcium carbonate (TUMS - DOSED IN MG ELEMENTAL CALCIUM) 500 MG chewable tablet Chew 1-2 tablets by mouth 3 (three) times daily  as needed for indigestion or heartburn.   01/16/2020 at Unknown time  . cyanocobalamin (,VITAMIN B-12,) 1000 MCG/ML injection Inject 1,000 mcg into the muscle every 30 (thirty) days.    Past Month at Unknown time  . FERREX 150 150 MG capsule TAKE 1 CAPSULE BY MOUTH EVERY DAY (Patient taking differently: Take 150 mg by mouth daily.) 90 capsule 0 Past Month at Unknown time  . hydroxychloroquine (PLAQUENIL) 200 MG tablet Take 200 mg by mouth 2 (two) times daily.   01/17/2020 at Unknown time  . labetalol (NORMODYNE) 200 MG tablet TAKE 1 AND 1/2 TABLETS BY MOUTH TWICE A DAY. (Patient taking differently: Take 300 mg by mouth in the morning and at bedtime.) 90 tablet 11 01/17/2020 at Unknown time  . levothyroxine (SYNTHROID) 200 MCG tablet TAKE 1 TABLET (200 MCG TOTAL) BY MOUTH DAILY BEFORE BREAKFAST. 90 tablet 1 01/17/2020 at Unknown time  . Multiple Vitamins-Minerals (BARIATRIC  MULTIVITAMINS/IRON) CAPS Take 1 tablet by mouth daily.   01/16/2020 at Unknown time  . omeprazole (PRILOSEC) 20 MG capsule TAKE 1 CAPSULE (20 MG TOTAL) BY MOUTH 2 (TWO) TIMES DAILY BEFORE A MEAL. 180 capsule 1 01/17/2020 at Unknown time  . PARoxetine (PAXIL) 40 MG tablet Take 1 tablet (40 mg total) by mouth every morning. (Patient taking differently: Take 40 mg by mouth daily.) 90 tablet 3 01/17/2020 at Unknown time  . rosuvastatin (CRESTOR) 20 MG tablet Take 1 tablet (20 mg total) by mouth daily. (Patient taking differently: Take 20 mg by mouth every evening.) 30 tablet 11 01/16/2020 at Unknown time  . warfarin (COUMADIN) 7.5 MG tablet TAKE 1 & 1/2 TABLETS DAILY EXCEPT 1 TABLET ON MON AND THURS OR AS DIRECTED BY CLINIC 90 DAY SUPPLY (Patient taking differently: Take 11.25-15 mg by mouth See admin instructions. Take 1.5 tablets (11.25 mg) by mouth on Mondays, Tuesdays, Wednesdays, Thursdays, Fridays, & Saturdays. 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 Hg down to 10, plt 209 (clumped today).  Heparin level tonight remains subtherapeutic, increased to 0.23 after rate increase earlier today. No overt bleeding or complications noted. No issues with IV infusion per RN.    PTA warfarin dosing 15 mg on Sundays, and 11.25 mg all other days.  Goal of Therapy:  Heparin level 0.3-0.7 units/ml Monitor platelets by anticoagulation protocol: Yes   Plan:  Increase IV heparin to 1650 units/hr Repeat heparin level in 6 hrs Monitor daily CBC, s/sx bleeding Asked by Dr. Rennis Golden to hold Coumadin tonight for possible cath tomorrow.   Leia Alf, PharmD, BCPS Please check AMION for all [facility] contact numbers Clinical Pharmacist 01/18/2020 8:10 PM

## 2020-01-18 NOTE — Progress Notes (Signed)
Initial Nutrition Assessment  DOCUMENTATION CODES:   Morbid obesity  INTERVENTION:   - Ensure Max po daily, each supplement provides 150 kcal and 30 grams of protein  - MVI with minerals BID  - Calcium carbonate 500 mg (1 tablet) TID with meals  NUTRITION DIAGNOSIS:   Increased nutrient needs related to acute illness as evidenced by estimated needs.  GOAL:   Patient will meet greater than or equal to 90% of their needs  MONITOR:   PO intake,Supplement acceptance,Labs,Weight trends  REASON FOR ASSESSMENT:   Consult Other ("nutritional goals")  ASSESSMENT:   54 year old female who presented to the ED on 12/14 with chest pain. PMH of lupus, recurrent empyema, gastric sleeve surgery, HTN, HLD, GERD, arthritis, OSA.   Spoke with pt at bedside. Pt reports that she did well with breakfast this morning and states that her appetite is normal. No meal completions documented. Pt denies any N/V at this time.  Pt reports undergoing a sleeve gastrectomy in 2010 or 2011. She states that she continues to take her bariatric MVI and follows up with her PCP. Pt typically eats 2-3 meals daily. A meal may include fruit and cottage cheese, yogurt with granola, chicken with a vegetable, or salmon with a vegetable. Pt is aware of the importance of protein and fluids. Pt amenable to receiving MVI and calcium carbonate supplementation during admission.  Pt reports that she is undergoing a work-up for esophageal issues. Pt has experienced vomiting after eating and significant GERD symptoms but reports lately these have not been as bad. Pt has not been vomiting after meals over the last several weeks.  In addition to vitamin/mineral regimen, pt amenable to consuming Ensure Max to aid in meeting protein needs. At home, pt puts protein powder in her coffee.  Medications reviewed and include: colace, protonix, warfarin, heparin IVF: LR @ 75 ml/hr  Labs reviewed: creatinine 1.01, hemoglobin  10.0  NUTRITION - FOCUSED PHYSICAL EXAM:  Flowsheet Row Most Recent Value  Orbital Region No depletion  Upper Arm Region No depletion  Thoracic and Lumbar Region No depletion  Buccal Region No depletion  Temple Region No depletion  Clavicle Bone Region No depletion  Clavicle and Acromion Bone Region No depletion  Scapular Bone Region No depletion  Dorsal Hand No depletion  Patellar Region No depletion  Anterior Thigh Region No depletion  Posterior Calf Region Moderate depletion  Edema (RD Assessment) Mild  [BLE]  Hair Reviewed  Eyes Reviewed  Mouth Reviewed  Skin Reviewed  Nails Reviewed       Diet Order:   Diet Order            Diet regular Room service appropriate? Yes; Fluid consistency: Thin  Diet effective now                 EDUCATION NEEDS:   Education needs have been addressed  Skin:  Skin Assessment: Reviewed RN Assessment  Last BM:  01/17/20  Height:   Ht Readings from Last 1 Encounters:  01/17/20 5\' 5"  (1.651 m)    Weight:   Wt Readings from Last 1 Encounters:  01/17/20 (!) 142.7 kg    Ideal Body Weight:  56.8 kg  BMI:  Body mass index is 52.35 kg/m.  Estimated Nutritional Needs:   Kcal:  1700-1900  Protein:  80-95 grams  Fluid:  1.7-1.9 L    01/19/20, MS, RD, LDN Inpatient Clinical Dietitian Please see [facility] for contact information.

## 2020-01-18 NOTE — Progress Notes (Signed)
RT note. Pt. Refusing CPAP at this time, pt. Will let RT know if change in mind of wearing CPAP.

## 2020-01-19 ENCOUNTER — Encounter (HOSPITAL_COMMUNITY): Payer: Self-pay | Admitting: Cardiovascular Disease

## 2020-01-19 ENCOUNTER — Encounter (HOSPITAL_COMMUNITY)
Admission: EM | Disposition: A | Discharge status: Home / Self Care | Payer: Self-pay | Source: Home / Self Care | Attending: Internal Medicine

## 2020-01-19 DIAGNOSIS — Z9989 Dependence on other enabling machines and devices: Secondary | ICD-10-CM

## 2020-01-19 DIAGNOSIS — G4733 Obstructive sleep apnea (adult) (pediatric): Secondary | ICD-10-CM

## 2020-01-19 HISTORY — PX: RIGHT/LEFT HEART CATH AND CORONARY ANGIOGRAPHY: CATH118266

## 2020-01-19 LAB — POCT I-STAT 7, (LYTES, BLD GAS, ICA,H+H)
Acid-Base Excess: 3 mmol/L — ABNORMAL HIGH (ref 0.0–2.0)
Bicarbonate: 29.7 mmol/L — ABNORMAL HIGH (ref 20.0–28.0)
Calcium, Ion: 1.27 mmol/L (ref 1.15–1.40)
HCT: 30 % — ABNORMAL LOW (ref 36.0–46.0)
Hemoglobin: 10.2 g/dL — ABNORMAL LOW (ref 12.0–15.0)
O2 Saturation: 99 %
Potassium: 4.3 mmol/L (ref 3.5–5.1)
Sodium: 143 mmol/L (ref 135–145)
TCO2: 31 mmol/L (ref 22–32)
pCO2 arterial: 53.6 mmHg — ABNORMAL HIGH (ref 32.0–48.0)
pH, Arterial: 7.352 (ref 7.350–7.450)
pO2, Arterial: 148 mmHg — ABNORMAL HIGH (ref 83.0–108.0)

## 2020-01-19 LAB — CBC
HCT: 33.4 % — ABNORMAL LOW (ref 36.0–46.0)
Hemoglobin: 9.7 g/dL — ABNORMAL LOW (ref 12.0–15.0)
MCH: 23.4 pg — ABNORMAL LOW (ref 26.0–34.0)
MCHC: 29 g/dL — ABNORMAL LOW (ref 30.0–36.0)
MCV: 80.5 fL (ref 80.0–100.0)
Platelets: UNDETERMINED 10*3/uL (ref 150–400)
RBC: 4.15 MIL/uL (ref 3.87–5.11)
RDW: 16.3 % — ABNORMAL HIGH (ref 11.5–15.5)
WBC: 4.1 10*3/uL (ref 4.0–10.5)
nRBC: 0 % (ref 0.0–0.2)

## 2020-01-19 LAB — POCT I-STAT EG7
Acid-Base Excess: 3 mmol/L — ABNORMAL HIGH (ref 0.0–2.0)
Acid-Base Excess: 3 mmol/L — ABNORMAL HIGH (ref 0.0–2.0)
Bicarbonate: 29.7 mmol/L — ABNORMAL HIGH (ref 20.0–28.0)
Bicarbonate: 29.8 mmol/L — ABNORMAL HIGH (ref 20.0–28.0)
Calcium, Ion: 1.31 mmol/L (ref 1.15–1.40)
Calcium, Ion: 1.29 mmol/L (ref 1.15–1.40)
HCT: 31 % — ABNORMAL LOW (ref 36.0–46.0)
HCT: 32 % — ABNORMAL LOW (ref 36.0–46.0)
Hemoglobin: 10.5 g/dL — ABNORMAL LOW (ref 12.0–15.0)
Hemoglobin: 10.9 g/dL — ABNORMAL LOW (ref 12.0–15.0)
O2 Saturation: 79 %
O2 Saturation: 79 %
Potassium: 4.3 mmol/L (ref 3.5–5.1)
Potassium: 4.4 mmol/L (ref 3.5–5.1)
Sodium: 143 mmol/L (ref 135–145)
Sodium: 144 mmol/L (ref 135–145)
TCO2: 31 mmol/L (ref 22–32)
TCO2: 32 mmol/L (ref 22–32)
pCO2, Ven: 54.7 mmHg (ref 44.0–60.0)
pCO2, Ven: 56.1 mmHg (ref 44.0–60.0)
pH, Ven: 7.342 (ref 7.250–7.430)
pH, Ven: 7.334 (ref 7.250–7.430)
pO2, Ven: 47 mmHg — ABNORMAL HIGH (ref 32.0–45.0)
pO2, Ven: 47 mmHg — ABNORMAL HIGH (ref 32.0–45.0)

## 2020-01-19 LAB — FERRITIN: Ferritin: 28 ng/mL (ref 11–307)

## 2020-01-19 LAB — HEPARIN LEVEL (UNFRACTIONATED): Heparin Unfractionated: 0.34 IU/mL (ref 0.30–0.70)

## 2020-01-19 LAB — IRON AND TIBC
Iron: 33 ug/dL (ref 28–170)
Saturation Ratios: 9 % — ABNORMAL LOW (ref 10.4–31.8)
TIBC: 349 ug/dL (ref 250–450)
UIBC: 316 ug/dL

## 2020-01-19 LAB — PROTIME-INR
INR: 1.2 (ref 0.8–1.2)
Prothrombin Time: 14.9 seconds (ref 11.4–15.2)

## 2020-01-19 SURGERY — RIGHT/LEFT HEART CATH AND CORONARY ANGIOGRAPHY
Anesthesia: LOCAL

## 2020-01-19 MED ORDER — HEPARIN SODIUM (PORCINE) 1000 UNIT/ML IJ SOLN
INTRAMUSCULAR | Status: AC
  Filled 2020-01-19: qty 1

## 2020-01-19 MED ORDER — FENTANYL CITRATE (PF) 100 MCG/2ML IJ SOLN
INTRAMUSCULAR | Status: AC
  Filled 2020-01-19: qty 2

## 2020-01-19 MED ORDER — SODIUM CHLORIDE 0.9 % IV SOLN: INTRAVENOUS | Status: AC

## 2020-01-19 MED ORDER — HYDRALAZINE HCL 20 MG/ML IJ SOLN: 10.0000 mg | INTRAMUSCULAR | Status: AC | PRN

## 2020-01-19 MED ORDER — LIDOCAINE HCL (PF) 1 % IJ SOLN
INTRAMUSCULAR | Status: DC | PRN
  Administered 2020-01-19 (×2): 2 mL via INTRADERMAL

## 2020-01-19 MED ORDER — HEPARIN (PORCINE) IN NACL 1000-0.9 UT/500ML-% IV SOLN
INTRAVENOUS | Status: AC
  Filled 2020-01-19: qty 1000

## 2020-01-19 MED ORDER — MORPHINE SULFATE (PF) 2 MG/ML IV SOLN: 2.0000 mg | INTRAVENOUS | Status: DC | PRN

## 2020-01-19 MED ORDER — MIDAZOLAM HCL 2 MG/2ML IJ SOLN
INTRAMUSCULAR | Status: DC | PRN
  Administered 2020-01-19: 1 mg via INTRAVENOUS

## 2020-01-19 MED ORDER — HEPARIN (PORCINE) 25000 UT/250ML-% IV SOLN
1850.0000 [IU]/h | INTRAVENOUS | Status: DC
  Administered 2020-01-19: 1650 [IU]/h via INTRAVENOUS
  Administered 2020-01-20: 1850 [IU]/h via INTRAVENOUS
  Filled 2020-01-19: qty 250

## 2020-01-19 MED ORDER — WARFARIN SODIUM 7.5 MG PO TABS
15.0000 mg | ORAL_TABLET | Freq: Once | ORAL | Status: AC
  Administered 2020-01-19: 15 mg via ORAL
  Filled 2020-01-19: qty 2

## 2020-01-19 MED ORDER — LABETALOL HCL 5 MG/ML IV SOLN: 10.0000 mg | INTRAVENOUS | Status: AC | PRN

## 2020-01-19 MED ORDER — MIDAZOLAM HCL 2 MG/2ML IJ SOLN
INTRAMUSCULAR | Status: AC
  Filled 2020-01-19: qty 2

## 2020-01-19 MED ORDER — LIDOCAINE HCL (PF) 1 % IJ SOLN
INTRAMUSCULAR | Status: AC
  Filled 2020-01-19: qty 30

## 2020-01-19 MED ORDER — METOPROLOL TARTRATE 25 MG PO TABS: 25.0000 mg | ORAL_TABLET | Freq: Once | ORAL | Status: DC | PRN

## 2020-01-19 MED ORDER — SODIUM CHLORIDE 0.9 % IV SOLN: 250.0000 mL | INTRAVENOUS | Status: DC | PRN

## 2020-01-19 MED ORDER — FENTANYL CITRATE (PF) 100 MCG/2ML IJ SOLN
INTRAMUSCULAR | Status: DC | PRN
  Administered 2020-01-19: 25 ug via INTRAVENOUS

## 2020-01-19 MED ORDER — ONDANSETRON HCL 4 MG/2ML IJ SOLN: 4.0000 mg | Freq: Four times a day (QID) | INTRAMUSCULAR | Status: DC | PRN

## 2020-01-19 MED ORDER — VERAPAMIL HCL 2.5 MG/ML IV SOLN: INTRA_ARTERIAL | Status: DC | PRN

## 2020-01-19 MED ORDER — HEPARIN SODIUM (PORCINE) 1000 UNIT/ML IJ SOLN
INTRAMUSCULAR | Status: DC | PRN
  Administered 2020-01-19: 7000 [IU] via INTRAVENOUS

## 2020-01-19 MED ORDER — SODIUM CHLORIDE 0.9% FLUSH: 3.0000 mL | Freq: Two times a day (BID) | INTRAVENOUS | Status: DC

## 2020-01-19 MED ORDER — HEPARIN (PORCINE) 25000 UT/250ML-% IV SOLN: 1650.0000 [IU]/h | INTRAVENOUS | Status: DC

## 2020-01-19 MED ORDER — FUROSEMIDE 10 MG/ML IJ SOLN
40.0000 mg | Freq: Once | INTRAMUSCULAR | Status: AC
  Administered 2020-01-19: 40 mg via INTRAVENOUS
  Filled 2020-01-19: qty 4

## 2020-01-19 MED ORDER — SODIUM CHLORIDE 0.9% FLUSH: 3.0000 mL | INTRAVENOUS | Status: DC | PRN

## 2020-01-19 MED ORDER — HEPARIN (PORCINE) IN NACL 1000-0.9 UT/500ML-% IV SOLN
INTRAVENOUS | Status: DC | PRN
  Administered 2020-01-19 (×2): 500 mL

## 2020-01-19 MED ORDER — VERAPAMIL HCL 2.5 MG/ML IV SOLN
INTRAVENOUS | Status: AC
  Filled 2020-01-19: qty 2

## 2020-01-19 MED ORDER — NITROGLYCERIN 1 MG/10 ML FOR IR/CATH LAB
INTRA_ARTERIAL | Status: AC
  Filled 2020-01-19: qty 10

## 2020-01-19 MED ORDER — HYDRALAZINE HCL 20 MG/ML IJ SOLN: 5.0000 mg | INTRAMUSCULAR | Status: DC | PRN

## 2020-01-19 MED ORDER — IOHEXOL 350 MG/ML SOLN
INTRAVENOUS | Status: DC | PRN
  Administered 2020-01-19: 50 mL

## 2020-01-19 MED ORDER — ACETAMINOPHEN 325 MG PO TABS: 650.0000 mg | ORAL_TABLET | ORAL | Status: DC | PRN

## 2020-01-19 SURGICAL SUPPLY — 15 items
CATH BALLN WEDGE 5F 110CM (CATHETERS) ×2 IMPLANT
CATH INFINITI JR4 5F (CATHETERS) ×2 IMPLANT
CATH OPTITORQUE TIG 4.0 5F (CATHETERS) ×2 IMPLANT
DEVICE RAD COMP TR BAND LRG (VASCULAR PRODUCTS) ×2 IMPLANT
GLIDESHEATH SLEND A-KIT 6F 22G (SHEATH) ×2 IMPLANT
GUIDEWIRE INQWIRE 1.5J.035X260 (WIRE) ×1 IMPLANT
INQWIRE 1.5J .035X260CM (WIRE) ×2
KIT HEART LEFT (KITS) ×2 IMPLANT
MAT PREVALON FULL STRYKER (MISCELLANEOUS) ×2 IMPLANT
PACK CARDIAC CATHETERIZATION (CUSTOM PROCEDURE TRAY) ×2 IMPLANT
SHEATH GLIDE SLENDER 4/5FR (SHEATH) ×2 IMPLANT
TRANSDUCER W/STOPCOCK (MISCELLANEOUS) ×2 IMPLANT
TUBING CIL FLEX 10 FLL-RA (TUBING) ×2 IMPLANT
WIRE EMERALD ST .035X150CM (WIRE) ×2 IMPLANT
WIRE HI TORQ VERSACORE J 260CM (WIRE) ×2 IMPLANT

## 2020-01-19 NOTE — Progress Notes (Addendum)
ANTICOAGULATION CONSULT NOTE   Pharmacy Consult for Heparin, warfarin  Indication: DVT  Allergies  Allergen Reactions  . Lisinopril Cough  . Lovenox [Enoxaparin Sodium] Itching  . Moxifloxacin Other (See Comments)    REACTION: hallucinations  . Lovenox [Enoxaparin]     Itching     Patient Measurements: Height: 5\' 5"  (165.1 cm) Weight: (!) 142.7 kg (314 lb 9.5 oz) IBW/kg (Calculated) : 57 Heparin Dosing Weight: 92.7 kg  Vital Signs: Temp: 98 F (36.7 C) (12/16 1128) Temp Source: Oral (12/16 1128) BP: 121/76 (12/16 1128) Pulse Rate: 0 (12/16 1116)  Labs: Recent Labs    01/17/20 1002 01/17/20 1002 01/17/20 1157 01/17/20 1509 01/18/20 0248 01/18/20 1056 01/18/20 1932 01/19/20 0354  HGB 11.0*  --   --   --  10.0*  --   --  9.7*  HCT 38.6  --   --   --  32.4*  --   --  33.4*  PLT 209  --   --   --  PLATELET CLUMPS NOTED ON SMEAR, UNABLE TO ESTIMATE  --   --  PLATELET CLUMPS NOTED ON SMEAR, UNABLE TO ESTIMATE  LABPROT  --   --   --  15.4* 14.7  --   --  14.9  INR  --   --   --  1.3* 1.2  --   --  1.2  HEPARINUNFRC  --    < >  --   --  0.32 0.20* 0.23* 0.34  CREATININE 1.03*  --   --   --  1.01*  --   --   --   TROPONINIHS 7  --  6  --   --   --   --   --    < > = values in this interval not displayed.    Estimated Creatinine Clearance: 92.8 mL/min (A) (by C-G formula based on SCr of 1.01 mg/dL (H)).   Medical History: Past Medical History:  Diagnosis Date  . Arthritis    Lupus - hands/knees  . 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 GERD (gastroesophageal reflux disease)    on prilosec r/t gastric sleeve surgery  . HLD (hyperlipidemia)   . Iron deficiency anemia    IV dextran [place]  . Nephritis and nephropathy, not specified as acute or chronic, with unspecified pathological lesion in kidney   . Nonspecific abnormal results of liver function study   .  Obesity, unspecified   . Other specified acquired hypothyroidism   . Panic disorder without agoraphobia   . Personal history of venous thrombosis and embolism 2002   during pregnancy; ?factor 5 leiden (sees heme)  . Pleural effusion 2005   c/w lupus initial w/u; recurrent Right as pred tapered off July 2011  . Sleep apnea   . 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 Unspecified essential hypertension     Medications:  Facility-Administered Medications Prior to Admission  Medication Dose Route Frequency Provider Last Rate Last Admin  . cyanocobalamin ((VITAMIN B-12)) injection 1,000 mcg  1,000 mcg Intramuscular Q30 days [place], 02-13-1995 A, MD   1,000 mcg at 12/22/19 1151  . cyanocobalamin ((VITAMIN B-12)) injection 1,000 mcg  1,000 mcg Intramuscular Once 12/24/19, MD       Medications Prior to Admission  Medication Sig Dispense Refill  Last Dose  . bismuth subsalicylate (PEPTO BISMOL) 262 MG/15ML suspension Take 30 mLs by mouth every 6 (six) hours as needed for indigestion or diarrhea or loose stools.   Past Week at Unknown time  . calcium carbonate (TUMS - DOSED IN MG ELEMENTAL CALCIUM) 500 MG chewable tablet Chew 1-2 tablets by mouth 3 (three) times daily as needed for indigestion or heartburn.   01/16/2020 at Unknown time  . cyanocobalamin (,VITAMIN B-12,) 1000 MCG/ML injection Inject 1,000 mcg into the muscle every 30 (thirty) days.    Past Month at Unknown time  . FERREX 150 150 MG capsule TAKE 1 CAPSULE BY MOUTH EVERY DAY (Patient taking differently: Take 150 mg by mouth daily.) 90 capsule 0 Past Month at Unknown time  . hydroxychloroquine (PLAQUENIL) 200 MG tablet Take 200 mg by mouth 2 (two) times daily.   01/17/2020 at Unknown time  . labetalol (NORMODYNE) 200 MG tablet TAKE 1 AND 1/2 TABLETS BY MOUTH TWICE A DAY. (Patient taking differently: Take 300 mg by mouth in the morning and at bedtime.) 90 tablet 11 01/17/2020 at Unknown time  .  levothyroxine (SYNTHROID) 200 MCG tablet TAKE 1 TABLET (200 MCG TOTAL) BY MOUTH DAILY BEFORE BREAKFAST. 90 tablet 1 01/17/2020 at Unknown time  . Multiple Vitamins-Minerals (BARIATRIC MULTIVITAMINS/IRON) CAPS Take 1 tablet by mouth daily.   01/16/2020 at Unknown time  . omeprazole (PRILOSEC) 20 MG capsule TAKE 1 CAPSULE (20 MG TOTAL) BY MOUTH 2 (TWO) TIMES DAILY BEFORE A MEAL. 180 capsule 1 01/17/2020 at Unknown time  . PARoxetine (PAXIL) 40 MG tablet Take 1 tablet (40 mg total) by mouth every morning. (Patient taking differently: Take 40 mg by mouth daily.) 90 tablet 3 01/17/2020 at Unknown time  . rosuvastatin (CRESTOR) 20 MG tablet Take 1 tablet (20 mg total) by mouth daily. (Patient taking differently: Take 20 mg by mouth every evening.) 30 tablet 11 01/16/2020 at Unknown time  . warfarin (COUMADIN) 7.5 MG tablet TAKE 1 & 1/2 TABLETS DAILY EXCEPT 1 TABLET ON MON AND THURS OR AS DIRECTED BY CLINIC 90 DAY SUPPLY (Patient taking differently: Take 11.25-15 mg by mouth See admin instructions. Take 1.5 tablets (11.25 mg) by mouth on Mondays, Tuesdays, Wednesdays, Thursdays, Fridays, & Saturdays. 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 She is now s/p cath with aortic stenosis. Heparin to restart 4 hours after sheath removal and warfarin to restart today -INR= 1.2  Home warfarin dose: 15mg  Sun, 11.25 mg all other days  Goal of Therapy:  Heparin level 0.3-0.7 units/ml Monitor platelets by anticoagulation protocol: Yes   Plan:  Restart heparin 1650 units/hr at 3:30pm Heparin level in 8 hours and daily wth CBC daily Warfarin 15mg  po today Daily PT/INR  , PharmD Clinical Pharmacist **Pharmacist phone directory can now be found on [email or web address] ([email or web address]).  Listed under [facility].

## 2020-01-19 NOTE — Progress Notes (Signed)
Progress Note    Cheryl DenverLori A Tri  ZOX:[id number] DOB: 11/23/1965  DOA: 01/17/2020 PCP: Judy Pimpleower, Marne A, MD    Brief Narrative:     Medical records reviewed and are as summarized below:  [name] is an 54 y.o. female with medical history significant of HTN; HLE; OSA; HLD;  VTE; panic d/o; hypothyroidism; morbid obesity presenting with chest pain.  This AM, she awoke to L-sided chest/breast pain.  It was consistent.  Her arm felt different, somewhat heavy.  The pain would feel like a twinge, lasting a minute or 2 and then dissipate but would come back every 5-7 minutes and kept bothering her.  s/p cath.  Found to have severe AS   Assessment/Plan:   Principal Problem:   Chest pain of uncertain etiology Active Problems:   Hypothyroidism   HYPERCHOLESTEROLEMIA   Morbid obesity (HCC)   Essential hypertension   Systemic lupus erythematosus (HCC)   OSA on CPAP   Factor V Leiden (HCC)   Aortic stenosis   Empyema lung (HCC)   Chest pain- GI vs cardiac - worsening AS seen on echo- cards consult- s/p echo-- await plans per cards re: valve follow up -PPI BID, was to follow up with General Surgery for: consideration of diverticulectomy or diverticulopexy-has GI follow up 12/23  L lung empyema- chronic and stable -pulmonary consult appreciated  SLE/Factor V Leiden -coumadin per pharmacy-- heparin bridge -daily INR -Continue Plaquenil for now  HTN -Continue Labetalol  HLD -Continue Crestor  Anxiety -Continue Paxil  Hypothyroidism -TSH WNL -Continue Synthroid at current dose for now  OSA on CPAP -Continue CPAP QHS  Obesity Body mass index is 52.35 kg/m.   Family Communication/Anticipated D/C date and plan/Code Status   DVT prophylaxis: heparin/coumadin Code Status: Full Code.  Disposition Plan: Status is: Inpatient  Remains inpatient appropriate because:Inpatient level of care appropriate due to severity of illness   Dispo: The patient is from:  Home              Anticipated d/c is to: Home              Anticipated d/c date is: 2 days- once INR > 2              Patient currently is not medically stable to d/c.         Medical Consultants:    pulm  cards     Subjective:   No current chest pain Glad cath over   Objective:    Vitals:   01/19/20 1111 01/19/20 1116 01/19/20 1128 01/19/20 1200  BP: (!) 155/100  121/76 120/79  Pulse: 79 (!) 0  78  Resp: 15 (!) 0 20 13  Temp:   98 F (36.7 C)   TempSrc:   Oral   SpO2: (!) 0% (!) 0% (!) 88%   Weight:      Height:        Intake/Output Summary (Last 24 hours) at 01/19/2020 1314 Last data filed at 01/19/2020 0941 Gross per 24 hour  Intake 2482.48 ml  Output --  Net 2482.48 ml   Filed Weights   01/17/20 1900 01/17/20 2046 01/18/20 1535  Weight: 136.1 kg (!) 142.7 kg (!) 142.7 kg    Exam:  General: Appearance:    Severely obese female in no acute distress     Lungs:     Clear to auscultation bilaterally, respirations unlabored  Heart:    Normal heart rate. Normal rhythm.  +murmur  MS:   All extremities are intact.   Neurologic:   Awake, alert, oriented x 3. No apparent focal neurological           defect.                     Data Reviewed:   I have personally reviewed following labs and imaging studies:  Labs: Labs show the following:   Basic Metabolic Panel: Recent Labs  Lab 01/17/20 1002 01/18/20 0248 01/19/20 1050 01/19/20 1101  NA 142 138 143 143  K 4.6 5.0 4.3 4.3  CL 104 104  --   --   CO2 24 24  --   --   GLUCOSE 95 102*  --   --   BUN 12 13  --   --   CREATININE 1.03* 1.01*  --   --   CALCIUM 9.9 8.7*  --   --    GFR Estimated Creatinine Clearance: 92.8 mL/min (A) (by C-G formula based on SCr of 1.01 mg/dL (H)). Liver Function Tests: No results for input(s): AST, ALT, ALKPHOS, BILITOT, PROT, ALBUMIN in the last 168 hours. No results for input(s): LIPASE, AMYLASE in the last 168 hours. No results for input(s):  AMMONIA in the last 168 hours. Coagulation profile Recent Labs  Lab 01/17/20 1509 01/18/20 0248 01/19/20 0354  INR 1.3* 1.2 1.2    CBC: Recent Labs  Lab 01/17/20 1002 01/18/20 0248 01/19/20 0354 01/19/20 1050 01/19/20 1101  WBC 4.1 4.5 4.1  --   --   HGB 11.0* 10.0* 9.7* 10.5* 10.2*  HCT 38.6 32.4* 33.4* 31.0* 30.0*  MCV 81.6 78.8* 80.5  --   --   PLT 209 PLATELET CLUMPS NOTED ON SMEAR, UNABLE TO ESTIMATE PLATELET CLUMPS NOTED ON SMEAR, UNABLE TO ESTIMATE  --   --    Cardiac Enzymes: No results for input(s): CKTOTAL, CKMB, CKMBINDEX, TROPONINI in the last 168 hours. BNP (last 3 results) No results for input(s): PROBNP in the last 8760 hours. CBG: No results for input(s): GLUCAP in the last 168 hours. D-Dimer: No results for input(s): DDIMER in the last 72 hours. Hgb A1c: No results for input(s): HGBA1C in the last 72 hours. Lipid Profile: No results for input(s): CHOL, HDL, LDLCALC, TRIG, CHOLHDL, LDLDIRECT in the last 72 hours. Thyroid function studies: Recent Labs    01/18/20 0248  TSH 0.420   Anemia work up: Recent Labs    01/19/20 0354  FERRITIN 28  TIBC 349  IRON 33   Sepsis Labs: Recent Labs  Lab 01/17/20 1002 01/18/20 0248 01/19/20 0354  WBC 4.1 4.5 4.1    Microbiology Recent Results (from the past 240 hour(s))  Resp Panel by RT-PCR (Flu A&B, Covid) Nasopharyngeal Swab     Status: None   Collection Time: 01/17/20  3:37 PM   Specimen: Nasopharyngeal Swab; Nasopharyngeal(NP) swabs in vial transport medium  Result Value Ref Range Status   SARS Coronavirus 2 by RT PCR NEGATIVE NEGATIVE Final    Comment: (NOTE) SARS-CoV-2 target nucleic acids are NOT DETECTED.  The SARS-CoV-2 RNA is generally detectable in upper respiratory specimens during the acute phase of infection. The lowest concentration of SARS-CoV-2 viral copies this assay can detect is 138 copies/mL. A negative result does not preclude SARS-Cov-2 infection and should not be used as  the sole basis for treatment or other patient management decisions. A negative result may occur with  improper specimen collection/handling, submission of specimen other than nasopharyngeal swab, presence of  viral mutation(s) within the areas targeted by this assay, and inadequate number of viral copies(<138 copies/mL). A negative result must be combined with clinical observations, patient history, and epidemiological information. The expected result is Negative.  Fact Sheet for Patients:  BloggerCourse.com  Fact Sheet for Healthcare Providers:  SeriousBroker.it  This test is no t yet approved or cleared by the [place] FDA and  has been authorized for detection and/or diagnosis of SARS-CoV-2 by FDA under an Emergency Use Authorization (EUA). This EUA will remain  in effect (meaning this test can be used) for the duration of the COVID-19 declaration under Section 564(b)(1) of the Act, 21 U.S.C.section 360bbb-3(b)(1), unless the authorization is terminated  or revoked sooner.       Influenza A by PCR NEGATIVE NEGATIVE Final   Influenza B by PCR NEGATIVE NEGATIVE Final    Comment: (NOTE) The Xpert Xpress SARS-CoV-2/FLU/RSV plus assay is intended as an aid in the diagnosis of influenza from Nasopharyngeal swab specimens and should not be used as a sole basis for treatment. Nasal washings and aspirates are unacceptable for Xpert Xpress SARS-CoV-2/FLU/RSV testing.  Fact Sheet for Patients: BloggerCourse.com  Fact Sheet for Healthcare Providers: SeriousBroker.it  This test is not yet approved or cleared by the [place] FDA and has been authorized for detection and/or diagnosis of SARS-CoV-2 by FDA under an Emergency Use Authorization (EUA). This EUA will remain in effect (meaning this test can be used) for the duration of the COVID-19 declaration under Section 564(b)(1) of  the Act, 21 U.S.C. section 360bbb-3(b)(1), unless the authorization is terminated or revoked.  Performed at [facility], [address] [place], [place] [address]   MRSA PCR Screening     Status: None   Collection Time: 01/17/20  8:49 PM   Specimen: Nasal Mucosa; Nasopharyngeal  Result Value Ref Range Status   MRSA by PCR NEGATIVE NEGATIVE Final    Comment:        The GeneXpert MRSA Assay (FDA approved for NASAL specimens only), is one component of a comprehensive MRSA colonization surveillance program. It is not intended to diagnose MRSA infection nor to guide or monitor treatment for MRSA infections. Performed at [facility], [address] [place], [place] [address]     Procedures and diagnostic studies:  CT Chest W Contrast  Result Date: 01/17/2020 CLINICAL DATA:  Left chest pain, left arm pain EXAM: CT CHEST WITH CONTRAST TECHNIQUE: Multidetector CT imaging of the chest was performed during intravenous contrast administration. CONTRAST:  72mL OMNIPAQUE IOHEXOL 300 MG/ML  SOLN COMPARISON:  Chest x-ray today FINDINGS: Cardiovascular: Heart is normal size. Aorta is normal caliber. Coronary artery and aortic atherosclerosis. Mediastinum/Nodes: No mediastinal, hilar, or axillary adenopathy. Thyroid unremarkable. Esophagus is fluid-filled. Small hiatal hernia. Lungs/Pleura: There is a loculated left pleural effusion noted at the left lower chest. This is stable dating back to 2011 except for rim calcifications now present compatible with chronic process. This measures 13.2 x 8.5 cm. Compressive atelectasis or scarring adjacent to the left effusion. No effusion on the right. Areas of scarring in the right mid and lower lung. Upper Abdomen: Imaging into the upper abdomen demonstrates no acute findings. Postoperative changes in the stomach from apparent prior gastric sleeve. Musculoskeletal: Chest wall soft tissues are unremarkable. No acute bony abnormality. IMPRESSION:  Chronic thick rimmed loculated left pleural effusion is stable since 2011. The thick rind is now partially calcified. Adjacent compressive atelectasis or scarring in the left lower lobe. Fluid-filled esophagus  which may be related to reflux or dysmotility. Small hiatal hernia. Coronary artery disease. Aortic Atherosclerosis (ICD10-I70.0). Electronically Signed   By: Charlett Nose M.D.   On: 01/17/2020 19:30   CARDIAC CATHETERIZATION  Result Date: 01/19/2020  Hemodynamic findings consistent with aortic valve stenosis.    ECHOCARDIOGRAM LIMITED  Result Date: 01/17/2020    ECHOCARDIOGRAM LIMITED REPORT   Patient Name:   [name] Date of Exam: 01/17/2020 Medical Rec #:  [id number]      Height: Accession #:    [id number]     Weight: Date of Birth:  Nov 26, 1965       BSA: Patient Age:    54 years       BP:           131/74 mmHg Patient Gender: F              HR:           79 bpm. Exam Location:  Inpatient Procedure: Limited Echo, Limited Color Doppler and Cardiac Doppler STAT ECHO Indications:    chest pain  History:        Patient has prior history of Echocardiogram examinations, most                 recent 11/22/2018. Chronic kidney disease. lupus.,                 Signs/Symptoms:Murmur; Risk Factors:Dyslipidemia.  Sonographer:    Delcie Roch Referring Phys: [id number] JENNIFER YATES IMPRESSIONS  1. Left ventricular ejection fraction, by estimation, is 65 to 70%. The left ventricle has normal function. The left ventricle has no regional wall motion abnormalities. There is mild-to-moderate concentric left ventricular hypertrophy. Left ventricular  diastolic parameters are consistent with Grade II diastolic dysfunction (pseudonormalization).  2. Right ventricular systolic function is normal. The right ventricular size is normal.  3. A small pericardial effusion appears to be present around the RA although visualization is limited.  4. The mitral valve is normal in structure. Trivial mitral valve regurgitation.   5. The aortic valve is tricuspid. There is moderate calcification of the aortic valve. There is moderate thickening of the aortic valve. There is severe aortic valve stenosis with a mean gradient of , peak gradient 71.51mmHg, peak velocity 4.2 m/s,  AVA 0.9cm2 by continuity and DOI 0.26  6. The inferior vena cava is normal in size with greater than 50% respiratory variability, suggesting right atrial pressure of 3 mmHg. Comparison(s): Compared to prior echo on 11/22/2018, the aortic stenosis is now severe. FINDINGS  Left Ventricle: Left ventricular ejection fraction, by estimation, is 65 to 70%. The left ventricle has normal function. The left ventricle has no regional wall motion abnormalities. The left ventricular internal cavity size was normal in size. There is  mild-to-moderate concentric left ventricular hypertrophy. Left ventricular diastolic parameters are consistent with Grade II diastolic dysfunction (pseudonormalization). Right Ventricle: The right ventricular size is normal. Right ventricular systolic function is normal. Left Atrium: Left atrial size was normal in size. Right Atrium: Right atrial size was normal in size. Pericardium: A small pericardial effusion appears to be present around the RA although visualization is limited. Mitral Valve: The mitral valve is normal in structure. There is mild thickening of the mitral valve leaflet(s). There is mild calcification of the mitral valve leaflet(s). Mild mitral annular calcification. Trivial mitral valve regurgitation. Tricuspid Valve: The tricuspid valve is normal in structure. Tricuspid valve regurgitation is trivial. Aortic Valve: The aortic valve is tricuspid. There is moderate  calcification of the aortic valve. There is moderate thickening of the aortic valve. Severe aortic stenosis is present. Aortic valve mean gradient measures 46.0 mmHg. Aortic valve peak gradient measures 71.6 mmHg. Aortic valve area, by VTI measures 0.90 cm. DOI 0.26  Pulmonic Valve: The pulmonic valve was normal in structure. Pulmonic valve regurgitation is not visualized. Aorta: The aortic root and ascending aorta are structurally normal, with no evidence of dilitation. Venous: The inferior vena cava is normal in size with greater than 50% respiratory variability, suggesting right atrial pressure of 3 mmHg. IAS/Shunts: No atrial level shunt detected by color flow Doppler. LEFT VENTRICLE PLAX 2D LVIDd:         4.80 cm LVIDs:         2.90 cm LV PW:         1.20 cm LV IVS:        1.10 cm LVOT diam:     2.10 cm LV SV:         83 LVOT Area:     3.46 cm  AORTIC VALVE AV Area (Vmax):    0.79 cm AV Area (VTI):     0.90 cm AV Vmax:           423.00 cm/s AV VTI:            0.923 m AV Peak Grad:      71.6 mmHg AV Mean Grad:      46.0 mmHg LVOT Vmax:         96.90 cm/s LVOT VTI:          0.241 m LVOT/AV VTI ratio: 0.26  AORTA Ao Root diam: 3.10 cm Ao Asc diam:  3.30 cm MV E velocity: 112.00 cm/s MV A velocity: 127.00 cm/s  SHUNTS MV E/A ratio:  0.88         Systemic VTI:  0.24 m                             Systemic Diam: 2.10 cm Laurance Flatten MD Electronically signed by Laurance Flatten MD Signature Date/Time: 01/17/2020/6:08:26 PM    Final     Medications:   . calcium carbonate  1 tablet Oral TID WC  . docusate sodium  100 mg Oral BID  . hydroxychloroquine  200 mg Oral BID  . labetalol  300 mg Oral BID  . levothyroxine  200 mcg Oral QAC breakfast  . multivitamin with minerals  1 tablet Oral BID  . pantoprazole  40 mg Oral BID  . PARoxetine  40 mg Oral q morning - 10a  . Ensure Max Protein  11 oz Oral Daily  . rosuvastatin  20 mg Oral QPM  . sodium chloride flush  3 mL Intravenous Q12H  . sodium chloride flush  3 mL Intravenous Q12H  . Warfarin - Pharmacist Dosing Inpatient   Does not apply q1600   Continuous Infusions: . sodium chloride 75 mL/hr at 01/19/20 1131  . sodium chloride    . heparin       LOS: 2 days   Joseph Art  [facility]   How to contact the [facility] Attending or Consulting provider 7A - 7P or covering provider during after hours 7P -7A, for this patient?  1. Check the care team in [facility] and look for a) attending/consulting [facility] provider listed and b) the [facility] team listed 2. Log into [email or web address] and use [place]'s universal password to access. If  you do not have the password, please contact the hospital operator. 3. Locate the [facility] provider you are looking for under [facility] and page to a number that you can be directly reached. 4. If you still have difficulty reaching the provider, please page the [facility] (Director on Call) for the Hospitalists listed on amion for assistance.  01/19/2020, 1:14 PM

## 2020-01-19 NOTE — Progress Notes (Addendum)
   Reviewed cath results with Dr. Flora Lipps. Normal coronaries. Mean gradient 52.14 mmHg. Cardiac output 12.1 L/min with an index of 5 L/min/m2 by Fick. LVEDp 20. Mean pulmonary wedge pressure 18. Will give dose of IV Lasix 40mg  today. Talked with , PA-C, will go ahead and get TAVR CTs today and Structural Heart Team will see tomorrow.  Carlean Jews, PA-C 01/19/2020 2:19 PM   Addendum:  CT cannot be done today due to need for contrast load. Will be done tomorrow instead. Called and updated patient.  01/21/2020, PA-C 01/19/2020 2:26 PM

## 2020-01-19 NOTE — Interval H&P Note (Signed)
Cath Lab Visit (complete for each Cath Lab visit)  Clinical Evaluation Leading to the Procedure:   ACS: Yes.    Non-ACS:    Anginal Classification: CCS II  Anti-ischemic medical therapy: No Therapy  Non-Invasive Test Results: No non-invasive testing performed  Prior CABG: No previous CABG      History and Physical Interval Note:  01/19/2020 10:35 AM  [name]  has presented today for surgery, with the diagnosis of aortic stenosis.  The various methods of treatment have been discussed with the patient and family. After consideration of risks, benefits and other options for treatment, the patient has consented to  Procedure(s): RIGHT/LEFT HEART CATH AND CORONARY ANGIOGRAPHY (N/A) as a surgical intervention.  [name] history has been reviewed, patient examined, no change in status, stable for surgery.  I have reviewed the patient's chart and labs.  Questions were answered to the patient's satisfaction.     Nanetta Batty

## 2020-01-19 NOTE — Progress Notes (Signed)
ANTICOAGULATION CONSULT NOTE   Pharmacy Consult for Heparin  Indication: DVT  Allergies  Allergen Reactions  . Lisinopril Cough  . Lovenox [Enoxaparin Sodium] Itching  . Moxifloxacin Other (See Comments)    REACTION: hallucinations  . Lovenox [Enoxaparin]     Itching     Patient Measurements: Height: 5\' 5"  (165.1 cm) Weight: (!) 142.7 kg (314 lb 9.5 oz) IBW/kg (Calculated) : 57 Heparin Dosing Weight: 92.7 kg  Vital Signs: Temp: 98.1 F (36.7 C) (12/15 2300) Temp Source: Oral (12/15 2300) BP: 137/83 (12/15 2300) Pulse Rate: 84 (12/15 2300)  Labs: Recent Labs    01/17/20 1002 01/17/20 1002 01/17/20 1157 01/17/20 1509 01/18/20 0248 01/18/20 1056 01/18/20 1932 01/19/20 0354  HGB 11.0*  --   --   --  10.0*  --   --  9.7*  HCT 38.6  --   --   --  32.4*  --   --  33.4*  PLT 209  --   --   --  PLATELET CLUMPS NOTED ON SMEAR, UNABLE TO ESTIMATE  --   --  PLATELET CLUMPS NOTED ON SMEAR, UNABLE TO ESTIMATE  LABPROT  --   --   --  15.4* 14.7  --   --  14.9  INR  --   --   --  1.3* 1.2  --   --  1.2  HEPARINUNFRC  --    < >  --   --  0.32 0.20* 0.23* 0.34  CREATININE 1.03*  --   --   --  1.01*  --   --   --   TROPONINIHS 7  --  6  --   --   --   --   --    < > = values in this interval not displayed.    Estimated Creatinine Clearance: 92.8 mL/min (A) (by C-G formula based on SCr of 1.01 mg/dL (H)).   Medical History: Past Medical History:  Diagnosis Date  . Arthritis    Lupus - hands/knees  . 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 GERD (gastroesophageal reflux disease)    on prilosec r/t gastric sleeve surgery  . HLD (hyperlipidemia)   . Iron deficiency anemia    IV dextran [place]  . Nephritis and nephropathy, not specified as acute or chronic, with unspecified pathological lesion in kidney   . Nonspecific abnormal results of liver function study   .  Obesity, unspecified   . Other specified acquired hypothyroidism   . Panic disorder without agoraphobia   . Personal history of venous thrombosis and embolism 2002   during pregnancy; ?factor 5 leiden (sees heme)  . Pleural effusion 2005   c/w lupus initial w/u; recurrent Right as pred tapered off July 2011  . Sleep apnea   . 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 Unspecified essential hypertension     Medications:  Facility-Administered Medications Prior to Admission  Medication Dose Route Frequency Provider Last Rate Last Admin  . cyanocobalamin ((VITAMIN B-12)) injection 1,000 mcg  1,000 mcg Intramuscular Q30 days [place], 02-13-1996 A, MD   1,000 mcg at 12/22/19 1151  . cyanocobalamin ((VITAMIN B-12)) injection 1,000 mcg  1,000 mcg Intramuscular Once 12/24/19, MD       Medications Prior to Admission  Medication Sig Dispense Refill Last  Dose  . bismuth subsalicylate (PEPTO BISMOL) 262 MG/15ML suspension Take 30 mLs by mouth every 6 (six) hours as needed for indigestion or diarrhea or loose stools.   Past Week at Unknown time  . calcium carbonate (TUMS - DOSED IN MG ELEMENTAL CALCIUM) 500 MG chewable tablet Chew 1-2 tablets by mouth 3 (three) times daily as needed for indigestion or heartburn.   01/16/2020 at Unknown time  . cyanocobalamin (,VITAMIN B-12,) 1000 MCG/ML injection Inject 1,000 mcg into the muscle every 30 (thirty) days.    Past Month at Unknown time  . FERREX 150 150 MG capsule TAKE 1 CAPSULE BY MOUTH EVERY DAY (Patient taking differently: Take 150 mg by mouth daily.) 90 capsule 0 Past Month at Unknown time  . hydroxychloroquine (PLAQUENIL) 200 MG tablet Take 200 mg by mouth 2 (two) times daily.   01/17/2020 at Unknown time  . labetalol (NORMODYNE) 200 MG tablet TAKE 1 AND 1/2 TABLETS BY MOUTH TWICE A DAY. (Patient taking differently: Take 300 mg by mouth in the morning and at bedtime.) 90 tablet 11 01/17/2020 at Unknown time  .  levothyroxine (SYNTHROID) 200 MCG tablet TAKE 1 TABLET (200 MCG TOTAL) BY MOUTH DAILY BEFORE BREAKFAST. 90 tablet 1 01/17/2020 at Unknown time  . Multiple Vitamins-Minerals (BARIATRIC MULTIVITAMINS/IRON) CAPS Take 1 tablet by mouth daily.   01/16/2020 at Unknown time  . omeprazole (PRILOSEC) 20 MG capsule TAKE 1 CAPSULE (20 MG TOTAL) BY MOUTH 2 (TWO) TIMES DAILY BEFORE A MEAL. 180 capsule 1 01/17/2020 at Unknown time  . PARoxetine (PAXIL) 40 MG tablet Take 1 tablet (40 mg total) by mouth every morning. (Patient taking differently: Take 40 mg by mouth daily.) 90 tablet 3 01/17/2020 at Unknown time  . rosuvastatin (CRESTOR) 20 MG tablet Take 1 tablet (20 mg total) by mouth daily. (Patient taking differently: Take 20 mg by mouth every evening.) 30 tablet 11 01/16/2020 at Unknown time  . warfarin (COUMADIN) 7.5 MG tablet TAKE 1 & 1/2 TABLETS DAILY EXCEPT 1 TABLET ON MON AND THURS OR AS DIRECTED BY CLINIC 90 DAY SUPPLY (Patient taking differently: Take 11.25-15 mg by mouth See admin instructions. Take 1.5 tablets (11.25 mg) by mouth on Mondays, Tuesdays, Wednesdays, Thursdays, Fridays, & Saturdays. 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 Hg down to 10, plt 209 (clumped today).  Heparin level tonight remains subtherapeutic, increased to 0.23 after rate increase earlier today. No overt bleeding or complications noted. No issues with IV infusion per RN.    PTA warfarin dosing 15 mg on Sundays, and 11.25 mg all other days.  12/16 AM update:  Heparin level therapeutic after rate increase Warfarin held for possible cath  Goal of Therapy:  Heparin level 0.3-0.7 units/ml Monitor platelets by anticoagulation protocol: Yes   Plan:  Cont heparin at 1650 units/hr 1200 heparin level  Abran Duke, PharmD, BCPS Clinical Pharmacist Phone:  [phone]

## 2020-01-19 NOTE — H&P (View-Only) (Signed)
DAILY PROGRESS NOTE   Patient Name: CHANCIE LAMPERT Date of Encounter: 01/19/2020 Cardiologist: Dietrich Pates, MD  Chief Complaint   No chest pain  Patient Profile   [name] is a 54 y.o. female with a history of moderate aortic stenosis on Echo in 11/2018, recurrent pleural effusion with superinfection on left s/p VATS and surgical pleurodesis in 2010, Factor V Leiden with prior PE/DVT on Coumadin, intracerebral hemorrhage, lupus with nephritis, hypertension, hyperlipidemia, GERD, hypothyroidism, and morbid obesity s/p gastric sleeve in 2012 who is being seen today for the evaluation of aortic stenosis at the request of Dr. Benjamine Mola.  Subjective   No chest pain overnight. Plan for [facility] today to evaluate coronaries and degree of aortic stenosis.  Objective   Vitals:   01/18/20 1610 01/18/20 1941 01/18/20 2218 01/18/20 2300  BP: (!) 141/83 134/80 135/90 137/83  Pulse: 84 85 87 84  Resp: (!) Temp: 98.7 F (37.1 C) 99.2 F (37.3 C)  98.1 F (36.7 C)  TempSrc: Oral Oral  Oral  SpO2:  94%  99%  Weight:      Height:        Intake/Output Summary (Last 24 hours) at 01/19/2020 0656 Last data filed at 01/19/2020 0600 Gross per 24 hour  Intake 2479.48 ml  Output --  Net 2479.48 ml   Filed Weights   01/17/20 1900 01/17/20 2046 01/18/20 1535  Weight: 136.1 kg (!) 142.7 kg (!) 142.7 kg    Physical Exam   General appearance: alert and no distress Neck: no carotid bruit, no JVD and thyroid not enlarged, symmetric, no tenderness/mass/nodules Lungs: clear to auscultation bilaterally Heart: regular rate and rhythm, S1, S2 normal and systolic murmur: systolic ejection 3/6, crescendo at 2nd right intercostal space Abdomen: soft, non-tender; bowel sounds normal; no masses,  no organomegaly Extremities: extremities normal, atraumatic, no cyanosis or edema Pulses: 2+ and symmetric Skin: Skin color, texture, turgor normal. No rashes or lesions Neurologic: Grossly  normal Psych: Pleasant  Inpatient Medications    Scheduled Meds: . calcium carbonate  1 tablet Oral TID WC  . docusate sodium  100 mg Oral BID  . hydroxychloroquine  200 mg Oral BID  . labetalol  300 mg Oral BID  . levothyroxine  200 mcg Oral QAC breakfast  . multivitamin with minerals  1 tablet Oral BID  . pantoprazole  40 mg Oral BID  . PARoxetine  40 mg Oral q morning - 10a  . Ensure Max Protein  11 oz Oral Daily  . rosuvastatin  20 mg Oral QPM  . sodium chloride flush  3 mL Intravenous Q12H  . sodium chloride flush  3 mL Intravenous Q12H  . Warfarin - Pharmacist Dosing Inpatient   Does not apply q1600    Continuous Infusions: . sodium chloride    . sodium chloride 1 mL/kg/hr (01/19/20 0521)  . heparin 1,650 Units/hr (01/19/20 0600)    PRN Meds: sodium chloride, acetaminophen **OR** acetaminophen, bisacodyl, hydrALAZINE, HYDROcodone-acetaminophen, morphine injection, ondansetron **OR** ondansetron (ZOFRAN) IV, polyethylene glycol, sodium chloride flush   Labs   Results for orders placed or performed during the hospital encounter of 01/17/20 (from the past 48 hour(s))  Basic metabolic panel     Status: Abnormal   Collection Time: 01/17/20 10:02 AM  Result Value Ref Range   Sodium 142 135 - 145 mmol/L   Potassium 4.6 3.5 - 5.1 mmol/L   Chloride 104 98 - 111 mmol/L   CO2 24 22 -  32 mmol/L   Glucose, Bld 95 70 - 99 mg/dL    Comment: Glucose reference range applies only to samples taken after fasting for at least 8 hours.   BUN 12 6 - 20 mg/dL   Creatinine, Ser 1.61 (H) 0.44 - 1.00 mg/dL   Calcium 9.9 8.9 - 09.6 mg/dL   GFR, Estimated >04 >54 mL/min    Comment: (NOTE) Calculated using the CKD-EPI Creatinine Equation (2021)    Anion gap 14 5 - 15    Comment: Performed at [facility], [address], [place], [place] [address]  CBC     Status: Abnormal   Collection Time: 01/17/20 10:02 AM  Result Value Ref Range   WBC 4.1 4.0 - 10.5 K/uL   RBC 4.73 3.87 -  5.11 MIL/uL   Hemoglobin 11.0 (L) 12.0 - 15.0 g/dL   HCT 91.4 78.2 - 95.6 %   MCV 81.6 80.0 - 100.0 fL   MCH 23.3 (L) 26.0 - 34.0 pg   MCHC 28.5 (L) 30.0 - 36.0 g/dL   RDW 21.3 (H) 08.6 - 57.8 %   Platelets 209 150 - 400 K/uL   nRBC 0.0 0.0 - 0.2 %    Comment: Performed at [facility], [address], [place], [place] [address]  Troponin I (High Sensitivity)     Status: None   Collection Time: 01/17/20 10:02 AM  Result Value Ref Range   Troponin I (High Sensitivity) 7 <18 ng/L    Comment: (NOTE) Elevated high sensitivity troponin I (hsTnI) values and significant  changes across serial measurements may suggest ACS but many other  chronic and acute conditions are known to elevate hsTnI results.  Refer to the "Links" section for chest pain algorithms and additional  guidance. Performed at [facility], [address], [place], [place] [address]   I-Stat beta hCG blood, ED     Status: Abnormal   Collection Time: 01/17/20 10:09 AM  Result Value Ref Range   I-stat hCG, quantitative 5.3 (H) <5 mIU/mL   Comment 3            Comment:   GEST. AGE      CONC.  (mIU/mL)   <=1 WEEK        5 - 50     2 WEEKS       50 - 500     3 WEEKS       100 - 10,000     4 WEEKS     1,000 - 30,000        FEMALE AND NON-PREGNANT FEMALE:     LESS THAN 5 mIU/mL   Troponin I (High Sensitivity)     Status: None   Collection Time: 01/17/20 11:57 AM  Result Value Ref Range   Troponin I (High Sensitivity) 6 <18 ng/L    Comment: (NOTE) Elevated high sensitivity troponin I (hsTnI) values and significant  changes across serial measurements may suggest ACS but many other  chronic and acute conditions are known to elevate hsTnI results.  Refer to the "Links" section for chest pain algorithms and additional  guidance. Performed at [facility], [address] [place], [place] [address]   Protime-INR     Status: Abnormal   Collection Time: 01/17/20  3:09 PM  Result Value Ref Range    Prothrombin Time 15.4 (H) 11.4 - 15.2 seconds   INR 1.3 (H) 0.8 - 1.2    Comment: (NOTE) INR goal  varies based on device and disease states. Performed at [facility], [address] [place], [place] [address]   Resp Panel by RT-PCR (Flu A&B, Covid) Nasopharyngeal Swab     Status: None   Collection Time: 01/17/20  3:37 PM   Specimen: Nasopharyngeal Swab; Nasopharyngeal(NP) swabs in vial transport medium  Result Value Ref Range   SARS Coronavirus 2 by RT PCR NEGATIVE NEGATIVE    Comment: (NOTE) SARS-CoV-2 target nucleic acids are NOT DETECTED.  The SARS-CoV-2 RNA is generally detectable in upper respiratory specimens during the acute phase of infection. The lowest concentration of SARS-CoV-2 viral copies this assay can detect is 138 copies/mL. A negative result does not preclude SARS-Cov-2 infection and should not be used as the sole basis for treatment or other patient management decisions. A negative result may occur with  improper specimen collection/handling, submission of specimen other than nasopharyngeal swab, presence of viral mutation(s) within the areas targeted by this assay, and inadequate number of viral copies(<138 copies/mL). A negative result must be combined with clinical observations, patient history, and epidemiological information. The expected result is Negative.  Fact Sheet for Patients:  BloggerCourse.com  Fact Sheet for Healthcare Providers:  SeriousBroker.it  This test is no t yet approved or cleared by the [place] FDA and  has been authorized for detection and/or diagnosis of SARS-CoV-2 by FDA under an Emergency Use Authorization (EUA). This EUA will remain  in effect (meaning this test can be used) for the duration of the COVID-19 declaration under Section 564(b)(1) of the Act, 21 U.S.C.section 360bbb-3(b)(1), unless the authorization is terminated  or revoked sooner.       Influenza A  by PCR NEGATIVE NEGATIVE   Influenza B by PCR NEGATIVE NEGATIVE    Comment: (NOTE) The Xpert Xpress SARS-CoV-2/FLU/RSV plus assay is intended as an aid in the diagnosis of influenza from Nasopharyngeal swab specimens and should not be used as a sole basis for treatment. Nasal washings and aspirates are unacceptable for Xpert Xpress SARS-CoV-2/FLU/RSV testing.  Fact Sheet for Patients: BloggerCourse.com  Fact Sheet for Healthcare Providers: SeriousBroker.it  This test is not yet approved or cleared by the [place] FDA and has been authorized for detection and/or diagnosis of SARS-CoV-2 by FDA under an Emergency Use Authorization (EUA). This EUA will remain in effect (meaning this test can be used) for the duration of the COVID-19 declaration under Section 564(b)(1) of the Act, 21 U.S.C. section 360bbb-3(b)(1), unless the authorization is terminated or revoked.  Performed at [facility], [address], [place], [place] [address]   MRSA PCR Screening     Status: None   Collection Time: 01/17/20  8:49 PM   Specimen: Nasal Mucosa; Nasopharyngeal  Result Value Ref Range   MRSA by PCR NEGATIVE NEGATIVE    Comment:        The GeneXpert MRSA Assay (FDA approved for NASAL specimens only), is one component of a comprehensive MRSA colonization surveillance program. It is not intended to diagnose MRSA infection nor to guide or monitor treatment for MRSA infections. Performed at [facility], [address], [place], [place] [address]   Basic metabolic panel     Status: Abnormal   Collection Time: 01/18/20  2:48 AM  Result Value Ref Range   Sodium 138 135 - 145 mmol/L   Potassium 5.0 3.5 - 5.1 mmol/L    Comment: SPECIMEN HEMOLYZED. HEMOLYSIS MAY AFFECT INTEGRITY OF RESULTS.   Chloride 104 98 - 111 mmol/L  CO2 24 22 - 32 mmol/L   Glucose, Bld 102 (H) 70 - 99 mg/dL    Comment: Glucose reference range applies only  to samples taken after fasting for at least 8 hours.   BUN 13 6 - 20 mg/dL   Creatinine, Ser 8.36 (H) 0.44 - 1.00 mg/dL   Calcium 8.7 (L) 8.9 - 10.3 mg/dL   GFR, Estimated >62 >94 mL/min    Comment: (NOTE) Calculated using the CKD-EPI Creatinine Equation (2021)    Anion gap 10 5 - 15    Comment: Performed at [facility], [address] [place], [place] [address]  Heparin level (unfractionated)     Status: None   Collection Time: 01/18/20  2:48 AM  Result Value Ref Range   Heparin Unfractionated 0.32 0.30 - 0.70 IU/mL    Comment: (NOTE) If heparin results are below expected values, and patient dosage has  been confirmed, suggest follow up testing of antithrombin III levels. Performed at [facility], [address], [place], [place] [address]   CBC     Status: Abnormal   Collection Time: 01/18/20  2:48 AM  Result Value Ref Range   WBC 4.5 4.0 - 10.5 K/uL   RBC 4.11 3.87 - 5.11 MIL/uL   Hemoglobin 10.0 (L) 12.0 - 15.0 g/dL   HCT 65.6 (L) 81.2 - 75.1 %   MCV 78.8 (L) 80.0 - 100.0 fL   MCH 24.3 (L) 26.0 - 34.0 pg   MCHC 30.9 30.0 - 36.0 g/dL   RDW 70.0 (H) 17.4 - 94.4 %   Platelets PLATELET CLUMPS NOTED ON SMEAR, UNABLE TO ESTIMATE 150 - 400 K/uL    Comment: PLATELET CLUMPING, SUGGEST RECOLLECTION OF SAMPLE IN CITRATE TUBE.   nRBC 0.0 0.0 - 0.2 %    Comment: Performed at [facility], [address] [place], [place] [address]  Protime-INR     Status: None   Collection Time: 01/18/20  2:48 AM  Result Value Ref Range   Prothrombin Time 14.7 11.4 - 15.2 seconds   INR 1.2 0.8 - 1.2    Comment: (NOTE) INR goal varies based on device and disease states. Performed at [facility], [address], [place], [place] [address]   HIV Antibody (routine testing w rflx)     Status: None   Collection Time: 01/18/20  2:48 AM  Result Value Ref Range   HIV Screen 4th Generation wRfx Non Reactive Non Reactive    Comment: Performed at [facility], [address] [place], [place] [address]  TSH     Status: None   Collection Time: 01/18/20  2:48 AM  Result Value Ref Range   TSH 0.420 0.350 - 4.500 uIU/mL    Comment: Performed by a 3rd Generation assay with a functional sensitivity of <=0.01 uIU/mL. Performed at [facility], [address], [place], [place] [address]   Heparin level (unfractionated)     Status: Abnormal   Collection Time: 01/18/20 10:56 AM  Result Value Ref Range   Heparin Unfractionated 0.20 (L) 0.30 - 0.70 IU/mL    Comment: (NOTE) If heparin results are below expected values, and patient dosage has  been confirmed, suggest follow up testing of antithrombin III levels. Performed at [facility], [address], [place], [place] [address]   Heparin level (unfractionated)     Status: Abnormal   Collection Time: 01/18/20  7:32 PM  Result Value Ref Range  Heparin Unfractionated 0.23 (L) 0.30 - 0.70 IU/mL    Comment: (NOTE) If heparin results are below expected values, and patient dosage has  been confirmed, suggest follow up testing of antithrombin III levels. Performed at [facility], [address] [place], [place] [address]   CBC     Status: Abnormal   Collection Time: 01/19/20  3:54 AM  Result Value Ref Range   WBC 4.1 4.0 - 10.5 K/uL    Comment: WHITE COUNT CONFIRMED ON SMEAR   RBC 4.15 3.87 - 5.11 MIL/uL   Hemoglobin 9.7 (L) 12.0 - 15.0 g/dL   HCT 65.7 (L) 84.6 - 96.2 %   MCV 80.5 80.0 - 100.0 fL   MCH 23.4 (L) 26.0 - 34.0 pg   MCHC 29.0 (L) 30.0 - 36.0 g/dL   RDW 95.2 (H) 84.1 - 32.4 %   Platelets PLATELET CLUMPS NOTED ON SMEAR, UNABLE TO ESTIMATE 150 - 400 K/uL   nRBC 0.0 0.0 - 0.2 %    Comment: Performed at [facility], [address], [place], [place] [address]  Protime-INR     Status: None   Collection Time: 01/19/20  3:54 AM  Result Value Ref Range   Prothrombin Time 14.9 11.4 - 15.2 seconds   INR 1.2 0.8 - 1.2    Comment: (NOTE) INR goal varies based on device and  disease states. Performed at [facility], [address], [place], [place] [address]   Ferritin     Status: None   Collection Time: 01/19/20  3:54 AM  Result Value Ref Range   Ferritin 28 11 - 307 ng/mL    Comment: Performed at [facility], [address] [place], [place] [address]  Iron and TIBC     Status: Abnormal   Collection Time: 01/19/20  3:54 AM  Result Value Ref Range   Iron 33 28 - 170 ug/dL   TIBC 474 259 - 563 ug/dL   Saturation Ratios 9 (L) 10.4 - 31.8 %   UIBC 316 ug/dL    Comment: Performed at [facility], [address], [place], [place] [address]  Heparin level (unfractionated)     Status: None   Collection Time: 01/19/20  3:54 AM  Result Value Ref Range   Heparin Unfractionated 0.34 0.30 - 0.70 IU/mL    Comment: (NOTE) If heparin results are below expected values, and patient dosage has  been confirmed, suggest follow up testing of antithrombin III levels. Performed at [facility], [address] [place], [place] [address]     ECG   N/A  Telemetry   Sinus rhythm - Personally Reviewed  Radiology    DG Chest 2 View  Result Date: 01/17/2020 CLINICAL DATA:  Left chest pain. EXAM: CHEST - 2 VIEW COMPARISON:  CT chest 09/20/2009.  Chest x-ray 09/25/2010. FINDINGS: Large pleural based density left posterior lung base similar but slightly smaller in size compared to the prior study. Mild calcification in the wall of this density has developed in the interval. Probable chronic loculated pleural effusion. There is compressive atelectasis in the left lower lobe Right lung clear.  Negative for heart failure or edema. IMPRESSION: Large loculated pleural fluid collection left posterior lung base with early calcification. Possible chronic empyema. CT chest with contrast may be helpful if the patient has acute symptoms. Electronically Signed   By: Marlan Palau M.D.   On: 01/17/2020 10:30   CT Chest W Contrast  Result Date:  01/17/2020 CLINICAL DATA:  Left chest pain, left arm pain EXAM: CT CHEST WITH CONTRAST TECHNIQUE: Multidetector CT imaging of the chest was performed during intravenous contrast administration. CONTRAST:  75mL OMNIPAQUE IOHEXOL 300 MG/ML  SOLN COMPARISON:  Chest x-ray today FINDINGS: Cardiovascular: Heart is normal size. Aorta is normal caliber. Coronary artery and aortic atherosclerosis. Mediastinum/Nodes: No mediastinal, hilar, or axillary adenopathy. Thyroid unremarkable. Esophagus is fluid-filled. Small hiatal hernia. Lungs/Pleura: There is a loculated left pleural effusion noted at the left lower chest. This is stable dating back to 2011 except for rim calcifications now present compatible with chronic process. This measures 13.2 x 8.5 cm. Compressive atelectasis or scarring adjacent to the left effusion. No effusion on the right. Areas of scarring in the right mid and lower lung. Upper Abdomen: Imaging into the upper abdomen demonstrates no acute findings. Postoperative changes in the stomach from apparent prior gastric sleeve. Musculoskeletal: Chest wall soft tissues are unremarkable. No acute bony abnormality. IMPRESSION: Chronic thick rimmed loculated left pleural effusion is stable since 2011. The thick rind is now partially calcified. Adjacent compressive atelectasis or scarring in the left lower lobe. Fluid-filled esophagus which may be related to reflux or dysmotility. Small hiatal hernia. Coronary artery disease. Aortic Atherosclerosis (ICD10-I70.0). Electronically Signed   By: Charlett Nose M.D.   On: 01/17/2020 19:30   ECHOCARDIOGRAM LIMITED  Result Date: 01/17/2020    ECHOCARDIOGRAM LIMITED REPORT   Patient Name:   TIFANY HIRSCH Date of Exam: 01/17/2020 Medical Rec #:  [id number]      Height: Accession #:    [id number]     Weight: Date of Birth:  1965-08-29       BSA: Patient Age:    54 years       BP:           131/74 mmHg Patient Gender: F              HR:           79 bpm. Exam Location:   Inpatient Procedure: Limited Echo, Limited Color Doppler and Cardiac Doppler STAT ECHO Indications:    chest pain  History:        Patient has prior history of Echocardiogram examinations, most                 recent 11/22/2018. Chronic kidney disease. lupus.,                 Signs/Symptoms:Murmur; Risk Factors:Dyslipidemia.  Sonographer:    Delcie Roch Referring Phys: [id number] JENNIFER YATES IMPRESSIONS  1. Left ventricular ejection fraction, by estimation, is 65 to 70%. The left ventricle has normal function. The left ventricle has no regional wall motion abnormalities. There is mild-to-moderate concentric left ventricular hypertrophy. Left ventricular  diastolic parameters are consistent with Grade II diastolic dysfunction (pseudonormalization).  2. Right ventricular systolic function is normal. The right ventricular size is normal.  3. A small pericardial effusion appears to be present around the RA although visualization is limited.  4. The mitral valve is normal in structure. Trivial mitral valve regurgitation.  5. The aortic valve is tricuspid. There is moderate calcification of the aortic valve. There is moderate thickening of the aortic valve. There is severe aortic valve stenosis with a mean gradient of , peak gradient 71.3mmHg, peak velocity 4.2 m/s,  AVA 0.9cm2 by continuity and DOI 0.26  6. The inferior vena cava is normal in size with greater than 50% respiratory variability, suggesting right atrial pressure  of 3 mmHg. Comparison(s): Compared to prior echo on 11/22/2018, the aortic stenosis is now severe. FINDINGS  Left Ventricle: Left ventricular ejection fraction, by estimation, is 65 to 70%. The left ventricle has normal function. The left ventricle has no regional wall motion abnormalities. The left ventricular internal cavity size was normal in size. There is  mild-to-moderate concentric left ventricular hypertrophy. Left ventricular diastolic parameters are consistent with Grade II  diastolic dysfunction (pseudonormalization). Right Ventricle: The right ventricular size is normal. Right ventricular systolic function is normal. Left Atrium: Left atrial size was normal in size. Right Atrium: Right atrial size was normal in size. Pericardium: A small pericardial effusion appears to be present around the RA although visualization is limited. Mitral Valve: The mitral valve is normal in structure. There is mild thickening of the mitral valve leaflet(s). There is mild calcification of the mitral valve leaflet(s). Mild mitral annular calcification. Trivial mitral valve regurgitation. Tricuspid Valve: The tricuspid valve is normal in structure. Tricuspid valve regurgitation is trivial. Aortic Valve: The aortic valve is tricuspid. There is moderate calcification of the aortic valve. There is moderate thickening of the aortic valve. Severe aortic stenosis is present. Aortic valve mean gradient measures 46.0 mmHg. Aortic valve peak gradient measures 71.6 mmHg. Aortic valve area, by VTI measures 0.90 cm. DOI 0.26 Pulmonic Valve: The pulmonic valve was normal in structure. Pulmonic valve regurgitation is not visualized. Aorta: The aortic root and ascending aorta are structurally normal, with no evidence of dilitation. Venous: The inferior vena cava is normal in size with greater than 50% respiratory variability, suggesting right atrial pressure of 3 mmHg. IAS/Shunts: No atrial level shunt detected by color flow Doppler. LEFT VENTRICLE PLAX 2D LVIDd:         4.80 cm LVIDs:         2.90 cm LV PW:         1.20 cm LV IVS:        1.10 cm LVOT diam:     2.10 cm LV SV:         83 LVOT Area:     3.46 cm  AORTIC VALVE AV Area (Vmax):    0.79 cm AV Area (VTI):     0.90 cm AV Vmax:           423.00 cm/s AV VTI:            0.923 m AV Peak Grad:      71.6 mmHg AV Mean Grad:      46.0 mmHg LVOT Vmax:         96.90 cm/s LVOT VTI:          0.241 m LVOT/AV VTI ratio: 0.26  AORTA Ao Root diam: 3.10 cm Ao Asc diam:  3.30  cm MV E velocity: 112.00 cm/s MV A velocity: 127.00 cm/s  SHUNTS MV E/A ratio:  0.88         Systemic VTI:  0.24 m                             Systemic Diam: 2.10 cm Laurance Flatten MD Electronically signed by Laurance Flatten MD Signature Date/Time: 01/17/2020/6:08:26 PM    Final     Cardiac Studies   See echo above  Assessment   1. Principal Problem: 2.   Chest pain of uncertain etiology 3. Active Problems: 4.   Hypothyroidism 5.   HYPERCHOLESTEROLEMIA 6.   Morbid obesity (HCC) 7.  Essential hypertension 8.   Systemic lupus erythematosus (HCC) 9.   OSA on CPAP 10.   Factor V Leiden (HCC) 11.   Empyema lung (HCC) 12.   Plan   1. No chest pain overnight- plan for [facility] today with Dr. Okey DupreEnd for coronary evaluation of chest pain and to assess severity of aortic stenosis.  Time Spent Directly with Patient:  I have spent a total of 25 minutes with the patient reviewing hospital notes, telemetry, EKGs, labs and examining the patient as well as establishing an assessment and plan that was discussed personally with the patient.  > 50% of time was spent in direct patient care.  Length of Stay:  LOS: 2 days   Chrystie NoseKenneth C. Hedwig Mcfall, MD, Mercy Hospital SpringfieldFACC, FACP  [place]  [facility]  Medical Director of the [facility]  [facility] Diplomate of the American Board of Clinical Lipidology Attending Cardiologist  Direct Dial: [phone]  Fax: [phone]  Website:  [email or web address]  Maddix Kliewer C Demarie Hyneman 01/19/2020, 6:56 AM

## 2020-01-19 NOTE — Progress Notes (Signed)
 DAILY PROGRESS NOTE   Patient Name: [name] Date of Encounter: 01/19/2020 Cardiologist: Paula Ross, MD  Chief Complaint   No chest pain  Patient Profile   [name] is a 54 y.o. female with a history of moderate aortic stenosis on Echo in 11/2018, recurrent pleural effusion with superinfection on left s/p VATS and surgical pleurodesis in 2010, Factor V Leiden with prior PE/DVT on Coumadin, intracerebral hemorrhage, lupus with nephritis, hypertension, hyperlipidemia, GERD, hypothyroidism, and morbid obesity s/p gastric sleeve in 2012 who is being seen today for the evaluation of aortic stenosis at the request of Dr. Vann.  Subjective   No chest pain overnight. Plan for R/LHC today to evaluate coronaries and degree of aortic stenosis.  Objective   Vitals:   01/18/20 1610 01/18/20 1941 01/18/20 2218 01/18/20 2300  BP: (!) 141/83 134/80 135/90 137/83  Pulse: 84 85 87 84  Resp: (!) 21 20  20  Temp: 98.7 F (37.1 C) 99.2 F (37.3 C)  98.1 F (36.7 C)  TempSrc: Oral Oral  Oral  SpO2:  94%  99%  Weight:      Height:        Intake/Output Summary (Last 24 hours) at 01/19/2020 0656 Last data filed at 01/19/2020 0600 Gross per 24 hour  Intake 2479.48 ml  Output --  Net 2479.48 ml   Filed Weights   01/17/20 1900 01/17/20 2046 01/18/20 1535  Weight: 136.1 kg (!) 142.7 kg (!) 142.7 kg    Physical Exam   General appearance: alert and no distress Neck: no carotid bruit, no JVD and thyroid not enlarged, symmetric, no tenderness/mass/nodules Lungs: clear to auscultation bilaterally Heart: regular rate and rhythm, S1, S2 normal and systolic murmur: systolic ejection 3/6, crescendo at 2nd right intercostal space Abdomen: soft, non-tender; bowel sounds normal; no masses,  no organomegaly Extremities: extremities normal, atraumatic, no cyanosis or edema Pulses: 2+ and symmetric Skin: Skin color, texture, turgor normal. No rashes or lesions Neurologic: Grossly  normal Psych: Pleasant  Inpatient Medications    Scheduled Meds: . calcium carbonate  1 tablet Oral TID WC  . docusate sodium  100 mg Oral BID  . hydroxychloroquine  200 mg Oral BID  . labetalol  300 mg Oral BID  . levothyroxine  200 mcg Oral QAC breakfast  . multivitamin with minerals  1 tablet Oral BID  . pantoprazole  40 mg Oral BID  . PARoxetine  40 mg Oral q morning - 10a  . Ensure Max Protein  11 oz Oral Daily  . rosuvastatin  20 mg Oral QPM  . sodium chloride flush  3 mL Intravenous Q12H  . sodium chloride flush  3 mL Intravenous Q12H  . Warfarin - Pharmacist Dosing Inpatient   Does not apply q1600    Continuous Infusions: . sodium chloride    . sodium chloride 1 mL/kg/hr (01/19/20 0521)  . heparin 1,650 Units/hr (01/19/20 0600)    PRN Meds: sodium chloride, acetaminophen **OR** acetaminophen, bisacodyl, hydrALAZINE, HYDROcodone-acetaminophen, morphine injection, ondansetron **OR** ondansetron (ZOFRAN) IV, polyethylene glycol, sodium chloride flush   Labs   Results for orders placed or performed during the hospital encounter of 01/17/20 (from the past 48 hour(s))  Basic metabolic panel     Status: Abnormal   Collection Time: 01/17/20 10:02 AM  Result Value Ref Range   Sodium 142 135 - 145 mmol/L   Potassium 4.6 3.5 - 5.1 mmol/L   Chloride 104 98 - 111 mmol/L   CO2 24 22 -   32 mmol/L   Glucose, Bld 95 70 - 99 mg/dL    Comment: Glucose reference range applies only to samples taken after fasting for at least 8 hours.   BUN 12 6 - 20 mg/dL   Creatinine, Ser 1.03 (H) 0.44 - 1.00 mg/dL   Calcium 9.9 8.9 - 10.3 mg/dL   GFR, Estimated >60 >60 mL/min    Comment: (NOTE) Calculated using the CKD-EPI Creatinine Equation (2021)    Anion gap 14 5 - 15    Comment: Performed at [facility], [address] [place], [place] [address]  CBC     Status: Abnormal   Collection Time: 01/17/20 10:02 AM  Result Value Ref Range   WBC 4.1 4.0 - 10.5 K/uL   RBC 4.73 3.87 -  5.11 MIL/uL   Hemoglobin 11.0 (L) 12.0 - 15.0 g/dL   HCT 38.6 36.0 - 46.0 %   MCV 81.6 80.0 - 100.0 fL   MCH 23.3 (L) 26.0 - 34.0 pg   MCHC 28.5 (L) 30.0 - 36.0 g/dL   RDW 16.0 (H) 11.5 - 15.5 %   Platelets 209 150 - 400 K/uL   nRBC 0.0 0.0 - 0.2 %    Comment: Performed at [facility], [address] [place], [place] [address]  Troponin I (High Sensitivity)     Status: None   Collection Time: 01/17/20 10:02 AM  Result Value Ref Range   Troponin I (High Sensitivity) 7 <18 ng/L    Comment: (NOTE) Elevated high sensitivity troponin I (hsTnI) values and significant  changes across serial measurements may suggest ACS but many other  chronic and acute conditions are known to elevate hsTnI results.  Refer to the "Links" section for chest pain algorithms and additional  guidance. Performed at [facility], [address] [place], [place] [address]   I-Stat beta hCG blood, ED     Status: Abnormal   Collection Time: 01/17/20 10:09 AM  Result Value Ref Range   I-stat hCG, quantitative 5.3 (H) <5 mIU/mL   Comment 3            Comment:   GEST. AGE      CONC.  (mIU/mL)   <=1 WEEK        5 - 50     2 WEEKS       50 - 500     3 WEEKS       100 - 10,000     4 WEEKS     1,000 - 30,000        FEMALE AND NON-PREGNANT FEMALE:     LESS THAN 5 mIU/mL   Troponin I (High Sensitivity)     Status: None   Collection Time: 01/17/20 11:57 AM  Result Value Ref Range   Troponin I (High Sensitivity) 6 <18 ng/L    Comment: (NOTE) Elevated high sensitivity troponin I (hsTnI) values and significant  changes across serial measurements may suggest ACS but many other  chronic and acute conditions are known to elevate hsTnI results.  Refer to the "Links" section for chest pain algorithms and additional  guidance. Performed at [facility], [address] [place], [place] [address]   Protime-INR     Status: Abnormal   Collection Time: 01/17/20  3:09 PM  Result Value Ref Range    Prothrombin Time 15.4 (H) 11.4 - 15.2 seconds   INR 1.3 (H) 0.8 - 1.2    Comment: (NOTE) INR goal   varies based on device and disease states. Performed at [facility], [address] [place], [place] [address]   Resp Panel by RT-PCR (Flu A&B, Covid) Nasopharyngeal Swab     Status: None   Collection Time: 01/17/20  3:37 PM   Specimen: Nasopharyngeal Swab; Nasopharyngeal(NP) swabs in vial transport medium  Result Value Ref Range   SARS Coronavirus 2 by RT PCR NEGATIVE NEGATIVE    Comment: (NOTE) SARS-CoV-2 target nucleic acids are NOT DETECTED.  The SARS-CoV-2 RNA is generally detectable in upper respiratory specimens during the acute phase of infection. The lowest concentration of SARS-CoV-2 viral copies this assay can detect is 138 copies/mL. A negative result does not preclude SARS-Cov-2 infection and should not be used as the sole basis for treatment or other patient management decisions. A negative result may occur with  improper specimen collection/handling, submission of specimen other than nasopharyngeal swab, presence of viral mutation(s) within the areas targeted by this assay, and inadequate number of viral copies(<138 copies/mL). A negative result must be combined with clinical observations, patient history, and epidemiological information. The expected result is Negative.  Fact Sheet for Patients:  https://www.fda.gov/media/152166/download  Fact Sheet for Healthcare Providers:  https://www.fda.gov/media/152162/download  This test is no t yet approved or cleared by the [place] FDA and  has been authorized for detection and/or diagnosis of SARS-CoV-2 by FDA under an Emergency Use Authorization (EUA). This EUA will remain  in effect (meaning this test can be used) for the duration of the COVID-19 declaration under Section 564(b)(1) of the Act, 21 U.S.C.section 360bbb-3(b)(1), unless the authorization is terminated  or revoked sooner.       Influenza A  by PCR NEGATIVE NEGATIVE   Influenza B by PCR NEGATIVE NEGATIVE    Comment: (NOTE) The Xpert Xpress SARS-CoV-2/FLU/RSV plus assay is intended as an aid in the diagnosis of influenza from Nasopharyngeal swab specimens and should not be used as a sole basis for treatment. Nasal washings and aspirates are unacceptable for Xpert Xpress SARS-CoV-2/FLU/RSV testing.  Fact Sheet for Patients: https://www.fda.gov/media/152166/download  Fact Sheet for Healthcare Providers: https://www.fda.gov/media/152162/download  This test is not yet approved or cleared by the [place] FDA and has been authorized for detection and/or diagnosis of SARS-CoV-2 by FDA under an Emergency Use Authorization (EUA). This EUA will remain in effect (meaning this test can be used) for the duration of the COVID-19 declaration under Section 564(b)(1) of the Act, 21 U.S.C. section 360bbb-3(b)(1), unless the authorization is terminated or revoked.  Performed at [facility], [address] [place], [place] [address]   MRSA PCR Screening     Status: None   Collection Time: 01/17/20  8:49 PM   Specimen: Nasal Mucosa; Nasopharyngeal  Result Value Ref Range   MRSA by PCR NEGATIVE NEGATIVE    Comment:        The GeneXpert MRSA Assay (FDA approved for NASAL specimens only), is one component of a comprehensive MRSA colonization surveillance program. It is not intended to diagnose MRSA infection nor to guide or monitor treatment for MRSA infections. Performed at  Hospital Lab, [address] [place], [place] [address]   Basic metabolic panel     Status: Abnormal   Collection Time: 01/18/20  2:48 AM  Result Value Ref Range   Sodium 138 135 - 145 mmol/L   Potassium 5.0 3.5 - 5.1 mmol/L    Comment: SPECIMEN HEMOLYZED. HEMOLYSIS MAY AFFECT INTEGRITY OF RESULTS.   Chloride 104 98 - 111 mmol/L     CO2 24 22 - 32 mmol/L   Glucose, Bld 102 (H) 70 - 99 mg/dL    Comment: Glucose reference range applies only  to samples taken after fasting for at least 8 hours.   BUN 13 6 - 20 mg/dL   Creatinine, Ser 1.01 (H) 0.44 - 1.00 mg/dL   Calcium 8.7 (L) 8.9 - 10.3 mg/dL   GFR, Estimated >60 >60 mL/min    Comment: (NOTE) Calculated using the CKD-EPI Creatinine Equation (2021)    Anion gap 10 5 - 15    Comment: Performed at [facility], [address] [place], [place] [address]  Heparin level (unfractionated)     Status: None   Collection Time: 01/18/20  2:48 AM  Result Value Ref Range   Heparin Unfractionated 0.32 0.30 - 0.70 IU/mL    Comment: (NOTE) If heparin results are below expected values, and patient dosage has  been confirmed, suggest follow up testing of antithrombin III levels. Performed at [facility], [address] [place], [place] [address]   CBC     Status: Abnormal   Collection Time: 01/18/20  2:48 AM  Result Value Ref Range   WBC 4.5 4.0 - 10.5 K/uL   RBC 4.11 3.87 - 5.11 MIL/uL   Hemoglobin 10.0 (L) 12.0 - 15.0 g/dL   HCT 32.4 (L) 36.0 - 46.0 %   MCV 78.8 (L) 80.0 - 100.0 fL   MCH 24.3 (L) 26.0 - 34.0 pg   MCHC 30.9 30.0 - 36.0 g/dL   RDW 16.2 (H) 11.5 - 15.5 %   Platelets PLATELET CLUMPS NOTED ON SMEAR, UNABLE TO ESTIMATE 150 - 400 K/uL    Comment: PLATELET CLUMPING, SUGGEST RECOLLECTION OF SAMPLE IN CITRATE TUBE.   nRBC 0.0 0.0 - 0.2 %    Comment: Performed at [facility], [address] [place], [place] [address]  Protime-INR     Status: None   Collection Time: 01/18/20  2:48 AM  Result Value Ref Range   Prothrombin Time 14.7 11.4 - 15.2 seconds   INR 1.2 0.8 - 1.2    Comment: (NOTE) INR goal varies based on device and disease states. Performed at [facility], [address] [place], [place] [address]   HIV Antibody (routine testing w rflx)     Status: None   Collection Time: 01/18/20  2:48 AM  Result Value Ref Range   HIV Screen 4th Generation wRfx Non Reactive Non Reactive    Comment: Performed at [facility], [address] [place], [place] [address]  TSH     Status: None   Collection Time: 01/18/20  2:48 AM  Result Value Ref Range   TSH 0.420 0.350 - 4.500 uIU/mL    Comment: Performed by a 3rd Generation assay with a functional sensitivity of <=0.01 uIU/mL. Performed at [facility], [address] [place], [place] [address]   Heparin level (unfractionated)     Status: Abnormal   Collection Time: 01/18/20 10:56 AM  Result Value Ref Range   Heparin Unfractionated 0.20 (L) 0.30 - 0.70 IU/mL    Comment: (NOTE) If heparin results are below expected values, and patient dosage has  been confirmed, suggest follow up testing of antithrombin III levels. Performed at [facility], [address] [place], [place] [address]   Heparin level (unfractionated)     Status: Abnormal   Collection Time: 01/18/20  7:32 PM  Result Value Ref Range     Heparin Unfractionated 0.23 (L) 0.30 - 0.70 IU/mL    Comment: (NOTE) If heparin results are below expected values, and patient dosage has  been confirmed, suggest follow up testing of antithrombin III levels. Performed at [facility], [address] [place], [place] [address]   CBC     Status: Abnormal   Collection Time: 01/19/20  3:54 AM  Result Value Ref Range   WBC 4.1 4.0 - 10.5 K/uL    Comment: WHITE COUNT CONFIRMED ON SMEAR   RBC 4.15 3.87 - 5.11 MIL/uL   Hemoglobin 9.7 (L) 12.0 - 15.0 g/dL   HCT 33.4 (L) 36.0 - 46.0 %   MCV 80.5 80.0 - 100.0 fL   MCH 23.4 (L) 26.0 - 34.0 pg   MCHC 29.0 (L) 30.0 - 36.0 g/dL   RDW 16.3 (H) 11.5 - 15.5 %   Platelets PLATELET CLUMPS NOTED ON SMEAR, UNABLE TO ESTIMATE 150 - 400 K/uL   nRBC 0.0 0.0 - 0.2 %    Comment: Performed at [facility], [address] [place], [place] [address]  Protime-INR     Status: None   Collection Time: 01/19/20  3:54 AM  Result Value Ref Range   Prothrombin Time 14.9 11.4 - 15.2 seconds   INR 1.2 0.8 - 1.2    Comment: (NOTE) INR goal varies based on device and  disease states. Performed at [facility], [address] [place], [place] [address]   Ferritin     Status: None   Collection Time: 01/19/20  3:54 AM  Result Value Ref Range   Ferritin 28 11 - 307 ng/mL    Comment: Performed at [facility], [address] [place], [place] [address]  Iron and TIBC     Status: Abnormal   Collection Time: 01/19/20  3:54 AM  Result Value Ref Range   Iron 33 28 - 170 ug/dL   TIBC 349 250 - 450 ug/dL   Saturation Ratios 9 (L) 10.4 - 31.8 %   UIBC 316 ug/dL    Comment: Performed at [facility], [address] [place], [place] [address]  Heparin level (unfractionated)     Status: None   Collection Time: 01/19/20  3:54 AM  Result Value Ref Range   Heparin Unfractionated 0.34 0.30 - 0.70 IU/mL    Comment: (NOTE) If heparin results are below expected values, and patient dosage has  been confirmed, suggest follow up testing of antithrombin III levels. Performed at [facility], [address] [place], [place] [address]     ECG   N/A  Telemetry   Sinus rhythm - Personally Reviewed  Radiology    DG Chest 2 View  Result Date: 01/17/2020 CLINICAL DATA:  Left chest pain. EXAM: CHEST - 2 VIEW COMPARISON:  CT chest 09/20/2009.  Chest x-ray 09/25/2010. FINDINGS: Large pleural based density left posterior lung base similar but slightly smaller in size compared to the prior study. Mild calcification in the wall of this density has developed in the interval. Probable chronic loculated pleural effusion. There is compressive atelectasis in the left lower lobe Right lung clear.  Negative for heart failure or edema. IMPRESSION: Large loculated pleural fluid collection left posterior lung base with early calcification. Possible chronic empyema. CT chest with contrast may be helpful if the patient has acute symptoms. Electronically Signed   By: Charles  Clark M.D.   On: 01/17/2020 10:30   CT Chest W Contrast    Result Date:  01/17/2020 CLINICAL DATA:  Left chest pain, left arm pain EXAM: CT CHEST WITH CONTRAST TECHNIQUE: Multidetector CT imaging of the chest was performed during intravenous contrast administration. CONTRAST:  75mL OMNIPAQUE IOHEXOL 300 MG/ML  SOLN COMPARISON:  Chest x-ray today FINDINGS: Cardiovascular: Heart is normal size. Aorta is normal caliber. Coronary artery and aortic atherosclerosis. Mediastinum/Nodes: No mediastinal, hilar, or axillary adenopathy. Thyroid unremarkable. Esophagus is fluid-filled. Small hiatal hernia. Lungs/Pleura: There is a loculated left pleural effusion noted at the left lower chest. This is stable dating back to 2011 except for rim calcifications now present compatible with chronic process. This measures 13.2 x 8.5 cm. Compressive atelectasis or scarring adjacent to the left effusion. No effusion on the right. Areas of scarring in the right mid and lower lung. Upper Abdomen: Imaging into the upper abdomen demonstrates no acute findings. Postoperative changes in the stomach from apparent prior gastric sleeve. Musculoskeletal: Chest wall soft tissues are unremarkable. No acute bony abnormality. IMPRESSION: Chronic thick rimmed loculated left pleural effusion is stable since 2011. The thick rind is now partially calcified. Adjacent compressive atelectasis or scarring in the left lower lobe. Fluid-filled esophagus which may be related to reflux or dysmotility. Small hiatal hernia. Coronary artery disease. Aortic Atherosclerosis (ICD10-I70.0). Electronically Signed   By: Kevin  Dover M.D.   On: 01/17/2020 19:30   ECHOCARDIOGRAM LIMITED  Result Date: 01/17/2020    ECHOCARDIOGRAM LIMITED REPORT   Patient Name:   Kimie A Meter Date of Exam: 01/17/2020 Medical Rec #:  [id number]      Height: Accession #:    [id number]     Weight: Date of Birth:  08/28/1965       BSA: Patient Age:    54 years       BP:           131/74 mmHg Patient Gender: F              HR:           79 bpm. Exam Location:   Inpatient Procedure: Limited Echo, Limited Color Doppler and Cardiac Doppler STAT ECHO Indications:    chest pain  History:        Patient has prior history of Echocardiogram examinations, most                 recent 11/22/2018. Chronic kidney disease. lupus.,                 Signs/Symptoms:Murmur; Risk Factors:Dyslipidemia.  Sonographer:    Lauren Pennington Referring Phys: [id number] JENNIFER YATES IMPRESSIONS  1. Left ventricular ejection fraction, by estimation, is 65 to 70%. The left ventricle has normal function. The left ventricle has no regional wall motion abnormalities. There is mild-to-moderate concentric left ventricular hypertrophy. Left ventricular  diastolic parameters are consistent with Grade II diastolic dysfunction (pseudonormalization).  2. Right ventricular systolic function is normal. The right ventricular size is normal.  3. A small pericardial effusion appears to be present around the RA although visualization is limited.  4. The mitral valve is normal in structure. Trivial mitral valve regurgitation.  5. The aortic valve is tricuspid. There is moderate calcification of the aortic valve. There is moderate thickening of the aortic valve. There is severe aortic valve stenosis with a mean gradient of 46mmHg, peak gradient 71.6mmHg, peak velocity 4.2 m/s,  AVA 0.9cm2 by continuity and DOI 0.26  6. The inferior vena cava is normal in size with greater than 50% respiratory variability, suggesting right atrial pressure   of 3 mmHg. Comparison(s): Compared to prior echo on 11/22/2018, the aortic stenosis is now severe. FINDINGS  Left Ventricle: Left ventricular ejection fraction, by estimation, is 65 to 70%. The left ventricle has normal function. The left ventricle has no regional wall motion abnormalities. The left ventricular internal cavity size was normal in size. There is  mild-to-moderate concentric left ventricular hypertrophy. Left ventricular diastolic parameters are consistent with Grade II  diastolic dysfunction (pseudonormalization). Right Ventricle: The right ventricular size is normal. Right ventricular systolic function is normal. Left Atrium: Left atrial size was normal in size. Right Atrium: Right atrial size was normal in size. Pericardium: A small pericardial effusion appears to be present around the RA although visualization is limited. Mitral Valve: The mitral valve is normal in structure. There is mild thickening of the mitral valve leaflet(s). There is mild calcification of the mitral valve leaflet(s). Mild mitral annular calcification. Trivial mitral valve regurgitation. Tricuspid Valve: The tricuspid valve is normal in structure. Tricuspid valve regurgitation is trivial. Aortic Valve: The aortic valve is tricuspid. There is moderate calcification of the aortic valve. There is moderate thickening of the aortic valve. Severe aortic stenosis is present. Aortic valve mean gradient measures 46.0 mmHg. Aortic valve peak gradient measures 71.6 mmHg. Aortic valve area, by VTI measures 0.90 cm. DOI 0.26 Pulmonic Valve: The pulmonic valve was normal in structure. Pulmonic valve regurgitation is not visualized. Aorta: The aortic root and ascending aorta are structurally normal, with no evidence of dilitation. Venous: The inferior vena cava is normal in size with greater than 50% respiratory variability, suggesting right atrial pressure of 3 mmHg. IAS/Shunts: No atrial level shunt detected by color flow Doppler. LEFT VENTRICLE PLAX 2D LVIDd:         4.80 cm LVIDs:         2.90 cm LV PW:         1.20 cm LV IVS:        1.10 cm LVOT diam:     2.10 cm LV SV:         83 LVOT Area:     3.46 cm  AORTIC VALVE AV Area (Vmax):    0.79 cm AV Area (VTI):     0.90 cm AV Vmax:           423.00 cm/s AV VTI:            0.923 m AV Peak Grad:      71.6 mmHg AV Mean Grad:      46.0 mmHg LVOT Vmax:         96.90 cm/s LVOT VTI:          0.241 m LVOT/AV VTI ratio: 0.26  AORTA Ao Root diam: 3.10 cm Ao Asc diam:  3.30  cm MV E velocity: 112.00 cm/s MV A velocity: 127.00 cm/s  SHUNTS MV E/A ratio:  0.88         Systemic VTI:  0.24 m                             Systemic Diam: 2.10 cm Heather Pemberton MD Electronically signed by Heather Pemberton MD Signature Date/Time: 01/17/2020/6:08:26 PM    Final     Cardiac Studies   See echo above  Assessment   1. Principal Problem: 2.   Chest pain of uncertain etiology 3. Active Problems: 4.   Hypothyroidism 5.   HYPERCHOLESTEROLEMIA 6.   Morbid obesity (HCC) 7.     Essential hypertension 8.   Systemic lupus erythematosus (HCC) 9.   OSA on CPAP 10.   Factor V Leiden (HCC) 11.   Empyema lung (HCC) 12.   Plan   1. No chest pain overnight- plan for L/RHC today with Dr. End for coronary evaluation of chest pain and to assess severity of aortic stenosis.  Time Spent Directly with Patient:  I have spent a total of 25 minutes with the patient reviewing hospital notes, telemetry, EKGs, labs and examining the patient as well as establishing an assessment and plan that was discussed personally with the patient.  > 50% of time was spent in direct patient care.  Length of Stay:  LOS: 2 days   Joye Wesenberg C. Tabetha Haraway, MD, FACC, FACP  [place]  [facility]  Medical Director of the [facility] Diplomate of the American Board of Clinical Lipidology Attending Cardiologist  Direct Dial: [phone]  Fax: [phone]  Website:  [email or web address]  Tishara Pizano C Dnyla Antonetti 01/19/2020, 6:56 AM   

## 2020-01-19 NOTE — Progress Notes (Signed)
ANTICOAGULATION CONSULT NOTE   Pharmacy Consult for Heparin, warfarin  Indication: DVT  Allergies  Allergen Reactions   Lisinopril Cough   Lovenox [Enoxaparin Sodium] Itching   Moxifloxacin Other (See Comments)    REACTION: hallucinations   Lovenox [Enoxaparin]     Itching     Patient Measurements: Height: 5\' 5"  (165.1 cm) Weight: (!) 142.7 kg (314 lb 9.5 oz) IBW/kg (Calculated) : 57 Heparin Dosing Weight: 92.7 kg  Vital Signs: Temp: 98.3 F (36.8 C) (12/16 1340) Temp Source: Oral (12/16 1340) BP: 134/84 (12/16 1340) Pulse Rate: 76 (12/16 1340)  Labs: Recent Labs    01/17/20 1002 01/17/20 1002 01/17/20 1157 01/17/20 1509 01/18/20 0248 01/18/20 1056 01/18/20 1932 01/19/20 0354 01/19/20 1050 01/19/20 1051 01/19/20 1101  HGB 11.0*  --   --   --  10.0*  --   --  9.7* 10.5* 10.9* 10.2*  HCT 38.6  --   --   --  32.4*  --   --  33.4* 31.0* 32.0* 30.0*  PLT 209  --   --   --  PLATELET CLUMPS NOTED ON SMEAR, UNABLE TO ESTIMATE  --   --  PLATELET CLUMPS NOTED ON SMEAR, UNABLE TO ESTIMATE  --   --   --   LABPROT  --   --   --  15.4* 14.7  --   --  14.9  --   --   --   INR  --   --   --  1.3* 1.2  --   --  1.2  --   --   --   HEPARINUNFRC  --    < >  --   --  0.32 0.20* 0.23* 0.34  --   --   --   CREATININE 1.03*  --   --   --  1.01*  --   --   --   --   --   --   TROPONINIHS 7  --  6  --   --   --   --   --   --   --   --    < > = values in this interval not displayed.    Estimated Creatinine Clearance: 92.8 mL/min (A) (by C-G formula based on SCr of 1.01 mg/dL (H)).   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    Loculated-chronic on Left-VanTright; s/p VATS 5/10   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   GERD (gastroesophageal reflux disease)    on prilosec r/t gastric sleeve surgery   HLD (hyperlipidemia)    Iron deficiency anemia    IV dextran [place]    Nephritis and nephropathy, not specified as acute or chronic, with unspecified pathological lesion in kidney    Nonspecific abnormal results of liver function study    Obesity, unspecified    Other specified acquired hypothyroidism    Panic disorder without agoraphobia    Personal history of venous thrombosis and embolism 2002   during pregnancy; ?factor 5 leiden (sees heme)   Pleural effusion 2005   c/w lupus initial w/u; recurrent Right as pred tapered off July 2011   Sleep apnea    Systemic lupus erythematosus (HCC)    renal GN, hx of pericardial eff in late 90's   Unspecified essential hypertension     Medications:  Facility-Administered Medications Prior to Admission  Medication Dose Route Frequency Provider Last Rate Last Admin   cyanocobalamin ((VITAMIN B-12)) injection 1,000 mcg  1,000 mcg Intramuscular Q30 days Tower, Audrie Gallus, MD   1,000 mcg at 12/22/19 1151   cyanocobalamin ((VITAMIN B-12)) injection 1,000 mcg  1,000 mcg Intramuscular Once Corwin Levins, MD       Medications Prior to Admission  Medication Sig Dispense Refill Last Dose   bismuth subsalicylate (PEPTO BISMOL) 262 MG/15ML suspension Take 30 mLs by mouth every 6 (six) hours as needed for indigestion or diarrhea or loose stools.   Past Week at Unknown time   calcium carbonate (TUMS - DOSED IN MG ELEMENTAL CALCIUM) 500 MG chewable tablet Chew 1-2 tablets by mouth 3 (three) times daily as needed for indigestion or heartburn.   01/16/2020 at Unknown time   cyanocobalamin (,VITAMIN B-12,) 1000 MCG/ML injection Inject 1,000 mcg into the muscle every 30 (thirty) days.    Past Month at Unknown time   FERREX 150 150 MG capsule TAKE 1 CAPSULE BY MOUTH EVERY DAY (Patient taking differently: Take 150 mg by mouth daily.) 90 capsule 0 Past Month at Unknown time   hydroxychloroquine (PLAQUENIL) 200 MG tablet Take 200 mg by mouth 2 (two) times daily.   01/17/2020 at Unknown time   labetalol (NORMODYNE) 200 MG  tablet TAKE 1 AND 1/2 TABLETS BY MOUTH TWICE A DAY. (Patient taking differently: Take 300 mg by mouth in the morning and at bedtime.) 90 tablet 11 01/17/2020 at Unknown time   levothyroxine (SYNTHROID) 200 MCG tablet TAKE 1 TABLET (200 MCG TOTAL) BY MOUTH DAILY BEFORE BREAKFAST. 90 tablet 1 01/17/2020 at Unknown time   Multiple Vitamins-Minerals (BARIATRIC MULTIVITAMINS/IRON) CAPS Take 1 tablet by mouth daily.   01/16/2020 at Unknown time   omeprazole (PRILOSEC) 20 MG capsule TAKE 1 CAPSULE (20 MG TOTAL) BY MOUTH 2 (TWO) TIMES DAILY BEFORE A MEAL. 180 capsule 1 01/17/2020 at Unknown time   PARoxetine (PAXIL) 40 MG tablet Take 1 tablet (40 mg total) by mouth every morning. (Patient taking differently: Take 40 mg by mouth daily.) 90 tablet 3 01/17/2020 at Unknown time   rosuvastatin (CRESTOR) 20 MG tablet Take 1 tablet (20 mg total) by mouth daily. (Patient taking differently: Take 20 mg by mouth every evening.) 30 tablet 11 01/16/2020 at Unknown time   warfarin (COUMADIN) 7.5 MG tablet TAKE 1 & 1/2 TABLETS DAILY EXCEPT 1 TABLET ON MON AND THURS OR AS DIRECTED BY CLINIC 90 DAY SUPPLY (Patient taking differently: Take 11.25-15 mg by mouth See admin instructions. Take 1.5 tablets (11.25 mg) by mouth on Mondays, Tuesdays, Wednesdays, Thursdays, Fridays, & Saturdays. 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 She is now s/p cath with aortic stenosis. Heparin to restart 4 hours after sheath removal and warfarin to restart today >> had bleed requiring air being added back to TR band. Discussed with cardiology and plan to restart heparin 2 hours after TR band removed (removed 12/16~1730). Okay to still continue with warfarin tonight.   -INR= 1.2  Home warfarin dose: 15mg  Sun, 11.25 mg all other days  Goal of Therapy:  Heparin level 0.3-0.7 units/ml Monitor platelets by  anticoagulation protocol: Yes   Plan:  Restart heparin 1650 units/hr on 12/16@2000  Heparin level in 8 hours and daily with CBC daily Warfarin 15mg  po today Daily PT/INR  1/17, PharmD, BCCCP Clinical  Pharmacist  Phone: [phone] 01/19/2020 5:46 PM  Please check AMION for all [facility] Pharmacy phone numbers After 10:00 PM, call Main Pharmacy [phone]

## 2020-01-19 NOTE — Plan of Care (Signed)

## 2020-01-20 ENCOUNTER — Other Ambulatory Visit (HOSPITAL_COMMUNITY): Payer: Self-pay | Admitting: Internal Medicine

## 2020-01-20 ENCOUNTER — Inpatient Hospital Stay (HOSPITAL_COMMUNITY): Payer: 59

## 2020-01-20 DIAGNOSIS — I350 Nonrheumatic aortic (valve) stenosis: Secondary | ICD-10-CM

## 2020-01-20 DIAGNOSIS — I160 Hypertensive urgency: Secondary | ICD-10-CM

## 2020-01-20 LAB — PROTIME-INR
INR: 1.2 (ref 0.8–1.2)
Prothrombin Time: 15 seconds (ref 11.4–15.2)

## 2020-01-20 LAB — CBC
HCT: 34.7 % — ABNORMAL LOW (ref 36.0–46.0)
Hemoglobin: 10 g/dL — ABNORMAL LOW (ref 12.0–15.0)
MCH: 23 pg — ABNORMAL LOW (ref 26.0–34.0)
MCHC: 28.8 g/dL — ABNORMAL LOW (ref 30.0–36.0)
MCV: 79.8 fL — ABNORMAL LOW (ref 80.0–100.0)
Platelets: 176 10*3/uL (ref 150–400)
RBC: 4.35 MIL/uL (ref 3.87–5.11)
RDW: 16.1 % — ABNORMAL HIGH (ref 11.5–15.5)
WBC: 4.3 10*3/uL (ref 4.0–10.5)
nRBC: 0 % (ref 0.0–0.2)

## 2020-01-20 LAB — BASIC METABOLIC PANEL
Anion gap: 9 (ref 5–15)
BUN: 10 mg/dL (ref 6–20)
CO2: 28 mmol/L (ref 22–32)
Calcium: 9.1 mg/dL (ref 8.9–10.3)
Chloride: 105 mmol/L (ref 98–111)
Creatinine, Ser: 1 mg/dL (ref 0.44–1.00)
GFR, Estimated: >60 mL/min (ref >60)
Glucose, Bld: 105 mg/dL — ABNORMAL HIGH (ref 70–99)
Potassium: 4 mmol/L (ref 3.5–5.1)
Sodium: 142 mmol/L (ref 135–145)

## 2020-01-20 LAB — HEPARIN LEVEL (UNFRACTIONATED): Heparin Unfractionated: 0.22 IU/mL — ABNORMAL LOW (ref 0.30–0.70)

## 2020-01-20 MED ORDER — ENOXAPARIN SODIUM 150 MG/ML ~~LOC~~ SOLN: 140.0000 mg | Freq: Two times a day (BID) | SUBCUTANEOUS | 0 refills | Status: DC

## 2020-01-20 MED ORDER — DIPHENHYDRAMINE HCL 25 MG PO CAPS: 25.0000 mg | ORAL_CAPSULE | Freq: Four times a day (QID) | ORAL | Status: DC | PRN

## 2020-01-20 MED ORDER — IOHEXOL 350 MG/ML SOLN
100.0000 mL | Freq: Once | INTRAVENOUS | Status: AC | PRN
  Administered 2020-01-20: 100 mL via INTRAVENOUS

## 2020-01-20 MED ORDER — WARFARIN SODIUM 7.5 MG PO TABS: 15.0000 mg | ORAL_TABLET | Freq: Once | ORAL | Status: DC

## 2020-01-20 MED ORDER — ENOXAPARIN SODIUM 150 MG/ML ~~LOC~~ SOLN
140.0000 mg | Freq: Two times a day (BID) | SUBCUTANEOUS | Status: DC
  Administered 2020-01-20: 140 mg via SUBCUTANEOUS
  Filled 2020-01-20 (×2): qty 0.94

## 2020-01-20 MED ORDER — ENOXAPARIN (LOVENOX) PATIENT EDUCATION KIT
PACK | Freq: Once | Status: AC
  Filled 2020-01-20: qty 1

## 2020-01-20 MED ORDER — ENOXAPARIN SODIUM 150 MG/ML ~~LOC~~ SOLN: 150.0000 mg | Freq: Two times a day (BID) | SUBCUTANEOUS | 0 refills | Status: DC

## 2020-01-20 MED FILL — ENOXAPARIN 150 MG/ML SYR: 150 | 7 days supply | Qty: 14 | Fill #0

## 2020-01-20 NOTE — Plan of Care (Signed)
  Problem: Education: Goal: Knowledge of General Education information will improve Description: Including pain rating scale, medication(s)/side effects and non-pharmacologic comfort measures Outcome: Adequate for Discharge   Problem: Health Behavior/Discharge Planning: Goal: Ability to manage health-related needs will improve Outcome: Adequate for Discharge   Problem: Clinical Measurements: Goal: Ability to maintain clinical measurements within normal limits will improve 01/20/2020 1658 by Gwynne Edinger, RN Outcome: Adequate for Discharge 01/20/2020 1410 by Gwynne Edinger, RN Outcome: Progressing   Problem: Clinical Measurements: Goal: Will remain free from infection 01/20/2020 1658 by Gwynne Edinger, RN Outcome: Adequate for Discharge 01/20/2020 1410 by Gwynne Edinger, RN Outcome: Progressing   Problem: Clinical Measurements: Goal: Diagnostic test results will improve Outcome: Adequate for Discharge   Problem: Clinical Measurements: Goal: Respiratory complications will improve Outcome: Adequate for Discharge   Problem: Clinical Measurements: Goal: Cardiovascular complication will be avoided Outcome: Adequate for Discharge   Problem: Activity: Goal: Risk for activity intolerance will decrease Outcome: Adequate for Discharge   Problem: Nutrition: Goal: Adequate nutrition will be maintained Outcome: Adequate for Discharge   Problem: Coping: Goal: Level of anxiety will decrease Outcome: Adequate for Discharge   Problem: Elimination: Goal: Will not experience complications related to bowel motility Outcome: Adequate for Discharge   Problem: Elimination: Goal: Will not experience complications related to urinary retention Outcome: Adequate for Discharge   Problem: Pain Managment: Goal: General experience of comfort will improve Outcome: Adequate for Discharge   Problem: Safety: Goal: Ability to remain free from injury will improve Outcome: Adequate  for Discharge   Problem: Nutrition: Goal: Adequate nutrition will be maintained Outcome: Adequate for Discharge   Problem: Coping: Goal: Level of anxiety will decrease Outcome: Adequate for Discharge   Problem: Elimination: Goal: Will not experience complications related to bowel motility Outcome: Adequate for Discharge   Problem: Elimination: Goal: Will not experience complications related to urinary retention Outcome: Adequate for Discharge   Problem: Pain Managment: Goal: General experience of comfort will improve Outcome: Adequate for Discharge   Problem: Safety: Goal: Ability to remain free from injury will improve Outcome: Adequate for Discharge   Problem: Skin Integrity: Goal: Risk for impaired skin integrity will decrease Outcome: Adequate for Discharge   Problem: Education: Goal: Understanding of CV disease, CV risk reduction, and recovery process will improve Outcome: Adequate for Discharge   Problem: Education: Goal: Individualized Educational Video(s) Outcome: Adequate for Discharge   Problem: Activity: Goal: Ability to return to baseline activity level will improve Outcome: Adequate for Discharge   Problem: Cardiovascular: Goal: Ability to achieve and maintain adequate cardiovascular perfusion will improve Outcome: Adequate for Discharge   Problem: Cardiovascular: Goal: Vascular access site(s) Level 0-1 will be maintained Outcome: Adequate for Discharge   Problem: Health Behavior/Discharge Planning: Goal: Ability to safely manage health-related needs after discharge will improve Outcome: Adequate for Discharge

## 2020-01-20 NOTE — Evaluation (Signed)
Physical Therapy Evaluation/Pre-TAVR assessment Patient Details Name: [name] MRN: [id number] DOB: [birth year] Today's Date: 01/20/2020   History of Present Illness  Pt adm with chest pain. Pt with severe aortic stenosis. Work up in progress for TAVR. PMH - lupus, Factor V Leiden, PE/DVT, ICH, HNT, morbid obesity.  Clinical Impression  Pre-TAVR assesment performed   6 Minute Walk Test:   Total Distance Walked:760 ft.    Did the pt need a rest break? No If yes, why? Pain:No; Fatigue:No; Dyspnea/O2 saturations: No   Pre-Test Post-Test  BP 131/85 145/97  HR 76 98  O2 saturations (indicated RA or L/min [place]) 96% on RA 97% on RA  Modified Borg Dyspnea Scale (0 none-10 maximal) 0 2  RPE (6 very light-10 very hard) 6 11    5  Meter Walk Test:  Trial 1 3.49 seconds  Trial 2 3.45 seconds  Trial 3 3.70 seconds  3 Trial Average/Gait Speed 3.55 seconds/4.62 ft/sec (<1.8 ft/sec indicates high fall risk)    Clinical Frailty Scale (1 very fit - 9 terminally ill): 3                 Follow Up Recommendations No PT follow up    Equipment Recommendations  None recommended by PT    Recommendations for Other Services       Precautions / Restrictions Precautions Precautions: None      Mobility  Bed Mobility Overal bed mobility: Independent                  Transfers Overall transfer level: Independent Equipment used: None                Ambulation/Gait Ambulation/Gait assistance: Independent Gait Distance (Feet): 1000 Feet Assistive device: None Gait Pattern/deviations: WFL(Within Functional Limits) Gait velocity: 4.62 ft/sec Gait velocity interpretation: >4.37 ft/sec, indicative of normal walking speed General Gait Details: Steady gait  Stairs            Wheelchair Mobility    Modified Rankin (Stroke Patients Only)       Balance Overall balance assessment: Independent;No apparent balance deficits (not formally assessed)                                            Pertinent Vitals/Pain Pain Assessment: No/denies pain    Home Living Family/patient expects to be discharged to:: Private residence Living Arrangements: Children                    Prior Function Level of Independence: Independent               Hand Dominance   Dominant Hand: Right    Extremity/Trunk Assessment   Upper Extremity Assessment Upper Extremity Assessment: Overall WFL for tasks assessed    Lower Extremity Assessment Lower Extremity Assessment: Overall WFL for tasks assessed       Communication   Communication: No difficulties  Cognition Arousal/Alertness: Awake/alert Behavior During Therapy: WFL for tasks assessed/performed Overall Cognitive Status: Within Functional Limits for tasks assessed                                        General Comments      Exercises     Assessment/Plan    PT Assessment Patent does not  need any further PT services  PT Problem List         PT Treatment Interventions      PT Goals (Current goals can be found in the Care Plan section)  Acute Rehab PT Goals PT Goal Formulation: All assessment and education complete, DC therapy    Frequency     Barriers to discharge        Co-evaluation               AM-PAC PT "6 Clicks" Mobility  Outcome Measure Help needed turning from your back to your side while in a flat bed without using bedrails?: None Help needed moving from lying on your back to sitting on the side of a flat bed without using bedrails?: None Help needed moving to and from a bed to a chair (including a wheelchair)?: None Help needed standing up from a chair using your arms (e.g., wheelchair or bedside chair)?: None Help needed to walk in hospital room?: None Help needed climbing 3-5 steps with a railing? : None 6 Click Score: 24    End of Session   Activity Tolerance: Patient tolerated treatment well Patient  left: in bed;with call bell/phone within reach   PT Visit Diagnosis: Other (comment) (pre tavr assessment)    Time: 9242-6834 PT Time Calculation (min) (ACUTE ONLY): 31 min   Charges:   PT Evaluation $PT Eval Low Complexity: 1 Low PT Treatments $Physical Performance Test: 8-22 mins        [facility] PT Acute Rehabilitation Services Pager [phone] Office [phone]   Angelina Ok [facility] 01/20/2020, 10:28 AM

## 2020-01-20 NOTE — Progress Notes (Signed)
Carotid completed   Please see CV Proc for preliminary results.   Mayreli Alden, RVT  

## 2020-01-20 NOTE — Progress Notes (Signed)
NURSING PROGRESS NOTE  [name] [id number] Discharge Data: 01/20/2020 5:00 PM Attending Provider: Geradine Girt, DO MBW:[id number], [name], MD     [name] to be D/C'd Home   per MD order.  Discussed with the patient the After Visit Summary and all questions fully answered. All IV's discontinued with no bleeding noted. All belongings returned to patient for patient to take home. Pt to be taken downstairs via wheelchair accompanied by a staff member.lovenox kit given to patient and pt education.  Last Vital Signs:  Blood pressure 111/74, pulse 75, temperature 98.3 F (36.8 C), temperature source Oral, resp. rate 18, height _0  (1.651 m), weight (!) 142.2 kg, last menstrual period 11/08/2010, SpO2 95 %.  Discharge Medication List Allergies as of 01/20/2020      Reactions   Lisinopril Cough   Lovenox [enoxaparin Sodium] Itching   Moxifloxacin Other (See Comments)   REACTION: hallucinations   Lovenox [enoxaparin]    Itching       Medication List    TAKE these medications   Bariatric Multivitamins/Iron Caps Take 1 tablet by mouth daily.   bismuth subsalicylate 935 TS/17BL suspension Commonly known as: PEPTO BISMOL Take 30 mLs by mouth every 6 (six) hours as needed for indigestion or diarrhea or loose stools.   calcium carbonate 500 MG chewable tablet Commonly known as: TUMS - dosed in mg elemental calcium Chew 1-2 tablets by mouth 3 (three) times daily as needed for indigestion or heartburn.   cyanocobalamin 1000 MCG/ML injection Commonly known as: (VITAMIN B-12) Inject 1,000 mcg into the muscle every 30 (thirty) days.   enoxaparin 150 MG/ML injection Commonly known as: LOVENOX Inject 1 mL (150 mg total) into the skin 2 (two) times daily for 7 days.   Ferrex 150 150 MG capsule Generic drug: iron polysaccharides TAKE 1 CAPSULE BY MOUTH EVERY DAY What changed: how much to take   hydroxychloroquine 200 MG tablet Commonly known as: PLAQUENIL Take 200 mg by  mouth 2 (two) times daily.   labetalol 200 MG tablet Commonly known as: NORMODYNE TAKE 1 AND 1/2 TABLETS BY MOUTH TWICE A DAY. What changed:   how much to take  how to take this  when to take this  additional instructions   levothyroxine 200 MCG tablet Commonly known as: SYNTHROID TAKE 1 TABLET (200 MCG TOTAL) BY MOUTH DAILY BEFORE BREAKFAST.   omeprazole 20 MG capsule Commonly known as: PRILOSEC TAKE 1 CAPSULE (20 MG TOTAL) BY MOUTH 2 (TWO) TIMES DAILY BEFORE A MEAL.   PARoxetine 40 MG tablet Commonly known as: PAXIL Take 1 tablet (40 mg total) by mouth every morning. What changed: when to take this   rosuvastatin 20 MG tablet Commonly known as: CRESTOR Take 1 tablet (20 mg total) by mouth daily. What changed: when to take this   warfarin 7.5 MG tablet Commonly known as: COUMADIN Take as directed. If you are unsure how to take this medication, talk to your nurse or doctor. Original instructions: TAKE 1 & 1/2 TABLETS DAILY EXCEPT 1 TABLET ON MON AND THURS OR AS DIRECTED BY CLINIC 90 DAY SUPPLY What changed:   how much to take  how to take this  when to take this  additional instructions

## 2020-01-20 NOTE — Progress Notes (Signed)
ANTICOAGULATION CONSULT NOTE  Pharmacy Consult for Heparin  Indication: DVT  Allergies  Allergen Reactions  . Lisinopril Cough  . Lovenox [Enoxaparin Sodium] Itching  . Moxifloxacin Other (See Comments)    REACTION: hallucinations  . Lovenox [Enoxaparin]     Itching     Patient Measurements: Height: 5\' 5"  (165.1 cm) Weight: (!) 142.7 kg (314 lb 9.5 oz) IBW/kg (Calculated) : 57 Heparin Dosing Weight: 92.7 kg  Vital Signs: Temp: 98.1 F (36.7 C) (12/16 2000) Temp Source: Oral (12/16 2000) BP: 118/77 (12/16 2000) Pulse Rate: 77 (12/16 2000)  Labs: Recent Labs    01/17/20 1002 01/17/20 1157 01/17/20 1509 01/18/20 0248 01/18/20 1056 01/18/20 1932 01/19/20 0354 01/19/20 1050 01/19/20 1051 01/19/20 1101 01/20/20 0156  HGB 11.0*  --   --  10.0*  --   --  9.7*   < > 10.9* 10.2* 10.0*  HCT 38.6  --   --  32.4*  --   --  33.4*   < > 32.0* 30.0* 34.7*  PLT 209  --   --  PLATELET CLUMPS NOTED ON SMEAR, UNABLE TO ESTIMATE  --   --  PLATELET CLUMPS NOTED ON SMEAR, UNABLE TO ESTIMATE  --   --   --  176  LABPROT  --   --    < > 14.7  --   --  14.9  --   --   --  15.0  INR  --   --    < > 1.2  --   --  1.2  --   --   --  1.2  HEPARINUNFRC  --   --   --  0.32   < > 0.23* 0.34  --   --   --  0.22*  CREATININE 1.03*  --   --  1.01*  --   --   --   --   --   --  1.00  TROPONINIHS 7 6  --   --   --   --   --   --   --   --   --    < > = values in this interval not displayed.    Estimated Creatinine Clearance: 93.8 mL/min (by C-G formula based on SCr of 1 mg/dL).   Assessment: 54 yo female with h/o DVT and factor 5 leiden deficiency s/p cath 12/16 for anticoagulation  Goal of Therapy:  Heparin level 0.3-0.7 units/ml Monitor platelets by anticoagulation protocol: Yes   Plan:  Increase Heparin 1850 units/hr Check heparin level in 8 hours.  Coumadin 15 mg today  1/17, PharmD, BCPS

## 2020-01-20 NOTE — Progress Notes (Addendum)
DAILY PROGRESS NOTE   Patient Name: [name] Date of Encounter: 01/20/2020 Cardiologist: Dietrich Pates, MD  Chief Complaint   No chest pain  Patient Profile   [name] is a 54 y.o. female with a history of moderate aortic stenosis on Echo in 11/2018, recurrent pleural effusion with superinfection on left s/p VATS and surgical pleurodesis in 2010, Factor V Leiden with prior PE/DVT on Coumadin, intracerebral hemorrhage, lupus with nephritis, hypertension, hyperlipidemia, GERD, hypothyroidism, and morbid obesity s/p gastric sleeve in 2012 who is being seen today for the evaluation of aortic stenosis at the request of Dr. Benjamine Mola.  Subjective   Cath yesterday showed no obstructive CAD - confirmed severe AS. Case discussed with the structural heart team. Will plan CT scans for TAVR work-up today. Given lasix with good urine output and improvement in symptoms.  Objective   Vitals:   01/19/20 1315 01/19/20 1340 01/19/20 2000 01/20/20 0522  BP: 129/86 134/84 118/77 115/68  Pulse: 75 76 77 73  Resp: 20 16 16 18   Temp:  98.3 F (36.8 C) 98.1 F (36.7 C) 97.9 F (36.6 C)  TempSrc:  Oral Oral Oral  SpO2:  97% 97%   Weight:    (!) 142.2 kg  Height:        Intake/Output Summary (Last 24 hours) at 01/20/2020 0855 Last data filed at 01/19/2020 1800 Gross per 24 hour  Intake 416.75 ml  Output --  Net 416.75 ml   Filed Weights   01/17/20 2046 01/18/20 1535 01/20/20 0522  Weight: (!) 142.7 kg (!) 142.7 kg (!) 142.2 kg    Physical Exam   General appearance: alert and no distress Neck: no carotid bruit, no JVD and thyroid not enlarged, symmetric, no tenderness/mass/nodules Lungs: clear to auscultation bilaterally Heart: regular rate and rhythm, S1, S2 normal and systolic murmur: systolic ejection 3/6, crescendo at 2nd right intercostal space Abdomen: soft, non-tender; bowel sounds normal; no masses,  no organomegaly Extremities: extremities normal, atraumatic, no cyanosis  or edema Pulses: 2+ and symmetric Skin: Skin color, texture, turgor normal. No rashes or lesions Neurologic: Grossly normal Psych: Pleasant  Inpatient Medications    Scheduled Meds: . calcium carbonate  1 tablet Oral TID WC  . docusate sodium  100 mg Oral BID  . hydroxychloroquine  200 mg Oral BID  . labetalol  300 mg Oral BID  . levothyroxine  200 mcg Oral QAC breakfast  . multivitamin with minerals  1 tablet Oral BID  . pantoprazole  40 mg Oral BID  . PARoxetine  40 mg Oral q morning - 10a  . Ensure Max Protein  11 oz Oral Daily  . rosuvastatin  20 mg Oral QPM  . sodium chloride flush  3 mL Intravenous Q12H  . sodium chloride flush  3 mL Intravenous Q12H  . warfarin  15 mg Oral ONCE-1600  . Warfarin - Pharmacist Dosing Inpatient   Does not apply q1600    Continuous Infusions: . sodium chloride    . heparin 1,850 Units/hr (01/20/20 0722)    PRN Meds: sodium chloride, acetaminophen **OR** acetaminophen, bisacodyl, hydrALAZINE, HYDROcodone-acetaminophen, metoprolol tartrate, morphine injection, ondansetron **OR** ondansetron (ZOFRAN) IV, polyethylene glycol, sodium chloride flush   Labs   Results for orders placed or performed during the hospital encounter of 01/17/20 (from the past 48 hour(s))  Heparin level (unfractionated)     Status: Abnormal   Collection Time: 01/18/20 10:56 AM  Result Value Ref Range   Heparin Unfractionated 0.20 (L) 0.30 -  0.70 IU/mL    Comment: (NOTE) If heparin results are below expected values, and patient dosage has  been confirmed, suggest follow up testing of antithrombin III levels. Performed at [facility], [address], [place], [place] [address]   Heparin level (unfractionated)     Status: Abnormal   Collection Time: 01/18/20  7:32 PM  Result Value Ref Range   Heparin Unfractionated 0.23 (L) 0.30 - 0.70 IU/mL    Comment: (NOTE) If heparin results are below expected values, and patient dosage has  been confirmed, suggest  follow up testing of antithrombin III levels. Performed at [facility], [address] [place], [place] [address]   CBC     Status: Abnormal   Collection Time: 01/19/20  3:54 AM  Result Value Ref Range   WBC 4.1 4.0 - 10.5 K/uL    Comment: WHITE COUNT CONFIRMED ON SMEAR   RBC 4.15 3.87 - 5.11 MIL/uL   Hemoglobin 9.7 (L) 12.0 - 15.0 g/dL   HCT 51.0 (L) 25.8 - 52.7 %   MCV 80.5 80.0 - 100.0 fL   MCH 23.4 (L) 26.0 - 34.0 pg   MCHC 29.0 (L) 30.0 - 36.0 g/dL   RDW 78.2 (H) 42.3 - 53.6 %   Platelets PLATELET CLUMPS NOTED ON SMEAR, UNABLE TO ESTIMATE 150 - 400 K/uL   nRBC 0.0 0.0 - 0.2 %    Comment: Performed at [facility], [address], [place], [place] [address]  Protime-INR     Status: None   Collection Time: 01/19/20  3:54 AM  Result Value Ref Range   Prothrombin Time 14.9 11.4 - 15.2 seconds   INR 1.2 0.8 - 1.2    Comment: (NOTE) INR goal varies based on device and disease states. Performed at [facility], [address] [place], [place] [address]   Ferritin     Status: None   Collection Time: 01/19/20  3:54 AM  Result Value Ref Range   Ferritin 28 11 - 307 ng/mL    Comment: Performed at [facility], [address], [place], [place] [address]  Iron and TIBC     Status: Abnormal   Collection Time: 01/19/20  3:54 AM  Result Value Ref Range   Iron 33 28 - 170 ug/dL   TIBC 509 326 - 712 ug/dL   Saturation Ratios 9 (L) 10.4 - 31.8 %   UIBC 316 ug/dL    Comment: Performed at [facility], [address] [place], [place] [address]  Heparin level (unfractionated)     Status: None   Collection Time: 01/19/20  3:54 AM  Result Value Ref Range   Heparin Unfractionated 0.34 0.30 - 0.70 IU/mL    Comment: (NOTE) If heparin results are below expected values, and patient dosage has  been confirmed, suggest follow up testing of antithrombin III levels. Performed at [facility], [address], [place], [place] [address]   POCT I-Stat EG7      Status: Abnormal   Collection Time: 01/19/20 10:50 AM  Result Value Ref Range   pH, Ven 7.342 7.250 - 7.430   pCO2, Ven 54.7 44.0 - 60.0 mmHg   pO2, Ven 47.0 (H) 32.0 - 45.0 mmHg   Bicarbonate 29.7 (H) 20.0 - 28.0 mmol/L   TCO2 31 22 - 32 mmol/L   O2 Saturation 79.0 %   Acid-Base Excess 3.0 (H) 0.0 - 2.0 mmol/L   Sodium 143 135 -  145 mmol/L   Potassium 4.3 3.5 - 5.1 mmol/L   Calcium, Ion 1.31 1.15 - 1.40 mmol/L   HCT 31.0 (L) 36.0 - 46.0 %   Hemoglobin 10.5 (L) 12.0 - 15.0 g/dL   Sample type MIXED VENOUS SAMPLE   POCT I-Stat EG7     Status: Abnormal   Collection Time: 01/19/20 10:51 AM  Result Value Ref Range   pH, Ven 7.334 7.250 - 7.430   pCO2, Ven 56.1 44.0 - 60.0 mmHg   pO2, Ven 47.0 (H) 32.0 - 45.0 mmHg   Bicarbonate 29.8 (H) 20.0 - 28.0 mmol/L   TCO2 32 22 - 32 mmol/L   O2 Saturation 79.0 %   Acid-Base Excess 3.0 (H) 0.0 - 2.0 mmol/L   Sodium 144 135 - 145 mmol/L   Potassium 4.4 3.5 - 5.1 mmol/L   Calcium, Ion 1.29 1.15 - 1.40 mmol/L   HCT 32.0 (L) 36.0 - 46.0 %   Hemoglobin 10.9 (L) 12.0 - 15.0 g/dL   Sample type MIXED VENOUS SAMPLE   I-STAT 7, (LYTES, BLD GAS, ICA, H+H)     Status: Abnormal   Collection Time: 01/19/20 11:01 AM  Result Value Ref Range   pH, Arterial 7.352 7.350 - 7.450   pCO2 arterial 53.6 (H) 32.0 - 48.0 mmHg   pO2, Arterial 148 (H) 83.0 - 108.0 mmHg   Bicarbonate 29.7 (H) 20.0 - 28.0 mmol/L   TCO2 31 22 - 32 mmol/L   O2 Saturation 99.0 %   Acid-Base Excess 3.0 (H) 0.0 - 2.0 mmol/L   Sodium 143 135 - 145 mmol/L   Potassium 4.3 3.5 - 5.1 mmol/L   Calcium, Ion 1.27 1.15 - 1.40 mmol/L   HCT 30.0 (L) 36.0 - 46.0 %   Hemoglobin 10.2 (L) 12.0 - 15.0 g/dL   Sample type ARTERIAL   CBC     Status: Abnormal   Collection Time: 01/20/20  1:56 AM  Result Value Ref Range   WBC 4.3 4.0 - 10.5 K/uL   RBC 4.35 3.87 - 5.11 MIL/uL   Hemoglobin 10.0 (L) 12.0 - 15.0 g/dL   HCT 16.134.7 (L) 09.636.0 - 04.546.0 %   MCV 79.8 (L) 80.0 - 100.0 fL   MCH 23.0 (L) 26.0 -  34.0 pg   MCHC 28.8 (L) 30.0 - 36.0 g/dL   RDW 40.916.1 (H) 81.111.5 - 91.415.5 %   Platelets 176 150 - 400 K/uL   nRBC 0.0 0.0 - 0.2 %    Comment: Performed at [facility], [address] [place], [place] [address]  Protime-INR     Status: None   Collection Time: 01/20/20  1:56 AM  Result Value Ref Range   Prothrombin Time 15.0 11.4 - 15.2 seconds   INR 1.2 0.8 - 1.2    Comment: (NOTE) INR goal varies based on device and disease states. Performed at [facility], [address] [place], [place] [address]   Basic metabolic panel     Status: Abnormal   Collection Time: 01/20/20  1:56 AM  Result Value Ref Range   Sodium 142 135 - 145 mmol/L   Potassium 4.0 3.5 - 5.1 mmol/L   Chloride 105 98 - 111 mmol/L   CO2 28 22 - 32 mmol/L   Glucose, Bld 105 (H) 70 - 99 mg/dL    Comment: Glucose reference range applies only to samples taken after fasting for at least 8 hours.   BUN 10 6 - 20 mg/dL   Creatinine, Ser  1.00 0.44 - 1.00 mg/dL   Calcium 9.1 8.9 - 91.6 mg/dL   GFR, Estimated >38 >46 mL/min    Comment: (NOTE) Calculated using the CKD-EPI Creatinine Equation (2021)    Anion gap 9 5 - 15    Comment: Performed at [facility], [address], [place], [place] [address]  Heparin level (unfractionated)     Status: Abnormal   Collection Time: 01/20/20  1:56 AM  Result Value Ref Range   Heparin Unfractionated 0.22 (L) 0.30 - 0.70 IU/mL    Comment: (NOTE) If heparin results are below expected values, and patient dosage has  been confirmed, suggest follow up testing of antithrombin III levels. Performed at [facility], [address] [place], [place] [address]     ECG   N/A  Telemetry   Sinus rhythm - Personally Reviewed  Radiology    CARDIAC CATHETERIZATION  Result Date: 01/19/2020  Hemodynamic findings consistent with aortic valve stenosis.     Cardiac Studies   See echo above  Assessment   Principal Problem:   Chest pain of uncertain  etiology Active Problems:   Hypothyroidism   HYPERCHOLESTEROLEMIA   Morbid obesity (HCC)   Essential hypertension   Systemic lupus erythematosus (HCC)   OSA on CPAP   Factor V Leiden (HCC)   Aortic stenosis   Empyema lung (HCC)   Plan   Feels well today- cath showed no obstructive CAD, but severe AS. Case presented to the structural heart team - will plan pre procedural CT scans today. From a cardiology standpoint, can be discharged afterward and follow-up as outpatient with the structural heart team and eventually her primary cardiologist. Dr. Tenny Craw.  She is on heparin since we held warfarin for cath - now re-establishing warfarin therapy. Unknown date for aortic valve replacement. She had remote DVT/PE but does have Factor V leiden - I could not find literature that I believe supports bridging in this scenario, however it sounds like she had some thrombotic complications including DVT/PE and intracerebral hemorrhage in the past. May be reasonable to bridge based on this and higher risk of decompensation due to severe AS.  Time Spent Directly with Patient:  I have spent a total of 25 minutes with the patient reviewing hospital notes, telemetry, EKGs, labs and examining the patient as well as establishing an assessment and plan that was discussed personally with the patient.  > 50% of time was spent in direct patient care.  Length of Stay:  LOS: 3 days   Chrystie Nose, MD, [facility], FACP  [place]  [facility]  Medical Director of the [facility]  Card[facility] Diplomate of the American Board of Clinical Lipidology Attending Cardiologist  Direct Dial: [phone]  Fax: [phone]  Website:  [email or web address] 01/20/2020, 8:55 AM

## 2020-01-20 NOTE — Discharge Instructions (Signed)

## 2020-01-20 NOTE — Progress Notes (Signed)
ANTICOAGULATION CONSULT NOTE   Pharmacy Consult for Heparin>lovenox, warfarin  Indication: DVT  Allergies  Allergen Reactions  . Lisinopril Cough  . Lovenox [Enoxaparin Sodium] Itching  . Moxifloxacin Other (See Comments)    REACTION: hallucinations  . Lovenox [Enoxaparin]     Itching     Patient Measurements: Height: 5\' 5"  (165.1 cm) Weight: (!) 142.2 kg (313 lb 9.6 oz) IBW/kg (Calculated) : 57 Heparin Dosing Weight: 92.7 kg  Vital Signs: Temp: 97.9 F (36.6 C) (12/17 0522) Temp Source: Oral (12/17 0522) BP: 127/88 (12/17 1051) Pulse Rate: 82 (12/17 1051)  Labs: Recent Labs    01/17/20 1157 01/17/20 1509 01/18/20 0248 01/18/20 1056 01/18/20 1932 01/19/20 0354 01/19/20 1050 01/19/20 1051 01/19/20 1101 01/20/20 0156  HGB  --    < > 10.0*  --   --  9.7*   < > 10.9* 10.2* 10.0*  HCT  --    < > 32.4*  --   --  33.4*   < > 32.0* 30.0* 34.7*  PLT  --   --  PLATELET CLUMPS NOTED ON SMEAR, UNABLE TO ESTIMATE  --   --  PLATELET CLUMPS NOTED ON SMEAR, UNABLE TO ESTIMATE  --   --   --  176  LABPROT  --    < > 14.7  --   --  14.9  --   --   --  15.0  INR  --    < > 1.2  --   --  1.2  --   --   --  1.2  HEPARINUNFRC  --   --  0.32   < > 0.23* 0.34  --   --   --  0.22*  CREATININE  --   --  1.01*  --   --   --   --   --   --  1.00  TROPONINIHS 6  --   --   --   --   --   --   --   --   --    < > = values in this interval not displayed.    Estimated Creatinine Clearance: 93.6 mL/min (by C-G formula based on SCr of 1 mg/dL).   Medical History: Past Medical History:  Diagnosis Date  . Arthritis    Lupus - hands/knees  . 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 GERD (gastroesophageal reflux disease)    on prilosec r/t gastric sleeve surgery  . HLD (hyperlipidemia)   . Iron deficiency anemia    IV dextran [place]  . Nephritis and nephropathy, not specified as acute  or chronic, with unspecified pathological lesion in kidney   . Nonspecific abnormal results of liver function study   . Obesity, unspecified   . Other specified acquired hypothyroidism   . Panic disorder without agoraphobia   . Personal history of venous thrombosis and embolism 2002   during pregnancy; ?factor 5 leiden (sees heme)  . Pleural effusion 2005   c/w lupus initial w/u; recurrent Right as pred tapered off July 2011  . Sleep apnea   . 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 Unspecified essential hypertension     Medications:  Facility-Administered Medications Prior to Admission  Medication Dose Route Frequency Provider Last Rate Last Admin  . cyanocobalamin ((VITAMIN B-12)) injection 1,000  mcg  1,000 mcg Intramuscular Q30 days Tower, Idamae Schuller A, MD   1,000 mcg at 12/22/19 1151  . cyanocobalamin ((VITAMIN B-12)) injection 1,000 mcg  1,000 mcg Intramuscular Once Corwin Levins, MD       Medications Prior to Admission  Medication Sig Dispense Refill Last Dose  . bismuth subsalicylate (PEPTO BISMOL) 262 MG/15ML suspension Take 30 mLs by mouth every 6 (six) hours as needed for indigestion or diarrhea or loose stools.   Past Week at Unknown time  . calcium carbonate (TUMS - DOSED IN MG ELEMENTAL CALCIUM) 500 MG chewable tablet Chew 1-2 tablets by mouth 3 (three) times daily as needed for indigestion or heartburn.   01/16/2020 at Unknown time  . cyanocobalamin (,VITAMIN B-12,) 1000 MCG/ML injection Inject 1,000 mcg into the muscle every 30 (thirty) days.    Past Month at Unknown time  . FERREX 150 150 MG capsule TAKE 1 CAPSULE BY MOUTH EVERY DAY (Patient taking differently: Take 150 mg by mouth daily.) 90 capsule 0 Past Month at Unknown time  . hydroxychloroquine (PLAQUENIL) 200 MG tablet Take 200 mg by mouth 2 (two) times daily.   01/17/2020 at Unknown time  . labetalol (NORMODYNE) 200 MG tablet TAKE 1 AND 1/2 TABLETS BY MOUTH TWICE A DAY.  (Patient taking differently: Take 300 mg by mouth in the morning and at bedtime.) 90 tablet 11 01/17/2020 at Unknown time  . levothyroxine (SYNTHROID) 200 MCG tablet TAKE 1 TABLET (200 MCG TOTAL) BY MOUTH DAILY BEFORE BREAKFAST. 90 tablet 1 01/17/2020 at Unknown time  . Multiple Vitamins-Minerals (BARIATRIC MULTIVITAMINS/IRON) CAPS Take 1 tablet by mouth daily.   01/16/2020 at Unknown time  . omeprazole (PRILOSEC) 20 MG capsule TAKE 1 CAPSULE (20 MG TOTAL) BY MOUTH 2 (TWO) TIMES DAILY BEFORE A MEAL. 180 capsule 1 01/17/2020 at Unknown time  . PARoxetine (PAXIL) 40 MG tablet Take 1 tablet (40 mg total) by mouth every morning. (Patient taking differently: Take 40 mg by mouth daily.) 90 tablet 3 01/17/2020 at Unknown time  . rosuvastatin (CRESTOR) 20 MG tablet Take 1 tablet (20 mg total) by mouth daily. (Patient taking differently: Take 20 mg by mouth every evening.) 30 tablet 11 01/16/2020 at Unknown time  . warfarin (COUMADIN) 7.5 MG tablet TAKE 1 & 1/2 TABLETS DAILY EXCEPT 1 TABLET ON MON AND THURS OR AS DIRECTED BY CLINIC 90 DAY SUPPLY (Patient taking differently: Take 11.25-15 mg by mouth See admin instructions. Take 1.5 tablets (11.25 mg) by mouth on Mondays, Tuesdays, Wednesdays, Thursdays, Fridays, & Saturdays. 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 She is now s/p cath with aortic stenosis. Heparin and warfarin restarted last night after cath.   -INR= 1.2 -Hgb stable 10, plt 176  Patient previously had itching with lovenox, but willing to try shots again to avoid hospitalization with heparin bridge. INR unchanged.   Home warfarin dose: 15mg  Sun, 11.25 mg all other days  Goal of Therapy:  INR goal 2.5-3.5 Heparin level 0.3-0.7 units/ml Monitor platelets by anticoagulation protocol: Yes   Plan:  Transition to enoxaparin this morning 1mg /kg q12 hours If  discharged today would recommend 15mg  of warfarin through the weekend Friday, Saturday, and Sunday then resume prior regimen Daily PT/INR while admitted INR check as outpatient in 4-5 days as able to be scheduled  Saturday PharmD., BCPS Clinical Pharmacist 01/20/2020 11:01 AM  Please check AMION for all [facility] Pharmacy phone numbers After 10:00 PM, call Main Pharmacy [phone]

## 2020-01-20 NOTE — Plan of Care (Signed)
  Problem: Clinical Measurements: Goal: Ability to maintain clinical measurements within normal limits will improve Outcome: Progressing   Problem: Clinical Measurements: Goal: Will remain free from infection Outcome: Progressing   

## 2020-01-20 NOTE — Discharge Summary (Signed)
Physician Discharge Summary  [name]:[id number] DOB: [birth year] DOA: 01/17/2020  PCP: Judy Pimple, MD  Admit date: 01/17/2020 Discharge date: 01/20/2020  Admitted From: home Discharge disposition: home   Recommendations for Outpatient Follow-Up:   1. lovenox bridge 2. INR check on Monday and further adjustment of coumadin   Discharge Diagnosis:   Principal Problem:   Chest pain of uncertain etiology Active Problems:   Hypothyroidism   HYPERCHOLESTEROLEMIA   Morbid obesity (HCC)   Essential hypertension   Systemic lupus erythematosus (HCC)   OSA on CPAP   Factor V Leiden (HCC)   Aortic stenosis   Empyema lung (HCC)    Discharge Condition: Improved.  Diet recommendation: Low sodium, heart healthy.    Wound care: None.  Code status: Full.   History of Present Illness:   [name] is a 54 y.o. female with medical history significant of HTN; HLE; OSA; HLD;  VTE; panic d/o; hypothyroidism; morbid obesity presenting with chest pain.  This AM, she awoke to L-sided chest/breast pain.  It was consistent.  Her arm felt different, somewhat heavy.  The pain would feel like a twinge, lasting a minute or 2 and then dissipate but would come back every 5-7 minutes and kept bothering her.  Not exertional, just on a consistent pattern.  She is still having the discomfort.  She does have occasional SOB over the last week, causing her to modify activity.  +occasional cough.  She was having post-prandial emesis for the last year and has a mild hiatal hernia.  No fevers.  No LAD, night sweats.  +fatigue, chronic.  She feels like her lupus has been pretty well controlled, no recent flares other than fatigue from overexertion.  She had repeat thoracentesis with eventual VATS/thoracotomy with talc 8-10 years ago with similar presentation, empyema.  At that time, she was having significant exertional dyspnea.   Also with pericardial effusion, none in 10+  years.    Hospital Course by Problem:   Chest pain- GI vs cardiac - worsening AS seen on echo- cards consult- s/p echo-- await plans per cards re: valve follow up -PPI BID, was to follow up with General Surgery for: consideration of diverticulectomy or diverticulopexy-has GI follow up 12/23  L lung empyema- chronic and stable -pulmonary consult appreciated  SLE/Factor V Leiden -coumadin with lovenox bridge -Continue Plaquenil for now  HTN -Continue Labetalol  HLD -Continue Crestor  Anxiety -Continue Paxil  Hypothyroidism -TSH WNL -Continue Synthroid at current dose for now  OSA on CPAP -Continue CPAP QHS  Obesity Body mass index is 52.35 kg/m.      Medical Consultants:   Cards pulm   Discharge Exam:   Vitals:   01/20/20 0522 01/20/20 1051  BP: 115/68 127/88  Pulse: 73 82  Resp: 18   Temp: 97.9 F (36.6 C)   SpO2:     Vitals:   01/19/20 1340 01/19/20 2000 01/20/20 0522 01/20/20 1051  BP: 134/84 118/77 115/68 127/88  Pulse: 76 77 73 82  Resp: 16 16 18    Temp: 98.3 F (36.8 C) 98.1 F (36.7 C) 97.9 F (36.6 C)   TempSrc: Oral Oral Oral   SpO2: 97% 97%    Weight:   (!) 142.2 kg   Height:        General exam: Appears calm and comfortable.  The results of significant diagnostics from this hospitalization (including imaging, microbiology, ancillary and laboratory) are listed below for reference.  Procedures and Diagnostic Studies:   DG Chest 2 View  Result Date: 01/17/2020 CLINICAL DATA:  Left chest pain. EXAM: CHEST - 2 VIEW COMPARISON:  CT chest 09/20/2009.  Chest x-ray 09/25/2010. FINDINGS: Large pleural based density left posterior lung base similar but slightly smaller in size compared to the prior study. Mild calcification in the wall of this density has developed in the interval. Probable chronic loculated pleural effusion. There is compressive atelectasis in the left lower lobe Right lung clear.  Negative for heart  failure or edema. IMPRESSION: Large loculated pleural fluid collection left posterior lung base with early calcification. Possible chronic empyema. CT chest with contrast may be helpful if the patient has acute symptoms. Electronically Signed   By: Marlan Palau M.D.   On: 01/17/2020 10:30   CT Chest W Contrast  Result Date: 01/17/2020 CLINICAL DATA:  Left chest pain, left arm pain EXAM: CT CHEST WITH CONTRAST TECHNIQUE: Multidetector CT imaging of the chest was performed during intravenous contrast administration. CONTRAST:  31mL OMNIPAQUE IOHEXOL 300 MG/ML  SOLN COMPARISON:  Chest x-ray today FINDINGS: Cardiovascular: Heart is normal size. Aorta is normal caliber. Coronary artery and aortic atherosclerosis. Mediastinum/Nodes: No mediastinal, hilar, or axillary adenopathy. Thyroid unremarkable. Esophagus is fluid-filled. Small hiatal hernia. Lungs/Pleura: There is a loculated left pleural effusion noted at the left lower chest. This is stable dating back to 2011 except for rim calcifications now present compatible with chronic process. This measures 13.2 x 8.5 cm. Compressive atelectasis or scarring adjacent to the left effusion. No effusion on the right. Areas of scarring in the right mid and lower lung. Upper Abdomen: Imaging into the upper abdomen demonstrates no acute findings. Postoperative changes in the stomach from apparent prior gastric sleeve. Musculoskeletal: Chest wall soft tissues are unremarkable. No acute bony abnormality. IMPRESSION: Chronic thick rimmed loculated left pleural effusion is stable since 2011. The thick rind is now partially calcified. Adjacent compressive atelectasis or scarring in the left lower lobe. Fluid-filled esophagus which may be related to reflux or dysmotility. Small hiatal hernia. Coronary artery disease. Aortic Atherosclerosis (ICD10-I70.0). Electronically Signed   By: Charlett Nose M.D.   On: 01/17/2020 19:30   ECHOCARDIOGRAM LIMITED  Result Date: 01/17/2020     ECHOCARDIOGRAM LIMITED REPORT   Patient Name:   [name] Date of Exam: 01/17/2020 Medical Rec #:  [id number]      Height: Accession #:    [id number]     Weight: Date of Birth:  March 03, 1965       BSA: Patient Age:    54 years       BP:           131/74 mmHg Patient Gender: F              HR:           79 bpm. Exam Location:  Inpatient Procedure: Limited Echo, Limited Color Doppler and Cardiac Doppler STAT ECHO Indications:    chest pain  History:        Patient has prior history of Echocardiogram examinations, most                 recent 11/22/2018. Chronic kidney disease. lupus.,                 Signs/Symptoms:Murmur; Risk Factors:Dyslipidemia.  Sonographer:    Delcie Roch Referring Phys: [id number] JENNIFER YATES IMPRESSIONS  1. Left ventricular ejection fraction, by estimation, is 65 to 70%. The left ventricle has normal function. The left  ventricle has no regional wall motion abnormalities. There is mild-to-moderate concentric left ventricular hypertrophy. Left ventricular  diastolic parameters are consistent with Grade II diastolic dysfunction (pseudonormalization).  2. Right ventricular systolic function is normal. The right ventricular size is normal.  3. A small pericardial effusion appears to be present around the RA although visualization is limited.  4. The mitral valve is normal in structure. Trivial mitral valve regurgitation.  5. The aortic valve is tricuspid. There is moderate calcification of the aortic valve. There is moderate thickening of the aortic valve. There is severe aortic valve stenosis with a mean gradient of 46mmHg, peak gradient 71.956mmHg, peak velocity 4.2 m/s,  AVA 0.9cm2 by continuity and DOI 0.26  6. The inferior vena cava is normal in size with greater than 50% respiratory variability, suggesting right atrial pressure of 3 mmHg. Comparison(s): Compared to prior echo on 11/22/2018, the aortic stenosis is now severe. FINDINGS  Left Ventricle: Left ventricular ejection fraction, by  estimation, is 65 to 70%. The left ventricle has normal function. The left ventricle has no regional wall motion abnormalities. The left ventricular internal cavity size was normal in size. There is  mild-to-moderate concentric left ventricular hypertrophy. Left ventricular diastolic parameters are consistent with Grade II diastolic dysfunction (pseudonormalization). Right Ventricle: The right ventricular size is normal. Right ventricular systolic function is normal. Left Atrium: Left atrial size was normal in size. Right Atrium: Right atrial size was normal in size. Pericardium: A small pericardial effusion appears to be present around the RA although visualization is limited. Mitral Valve: The mitral valve is normal in structure. There is mild thickening of the mitral valve leaflet(s). There is mild calcification of the mitral valve leaflet(s). Mild mitral annular calcification. Trivial mitral valve regurgitation. Tricuspid Valve: The tricuspid valve is normal in structure. Tricuspid valve regurgitation is trivial. Aortic Valve: The aortic valve is tricuspid. There is moderate calcification of the aortic valve. There is moderate thickening of the aortic valve. Severe aortic stenosis is present. Aortic valve mean gradient measures 46.0 mmHg. Aortic valve peak gradient measures 71.6 mmHg. Aortic valve area, by VTI measures 0.90 cm. DOI 0.26 Pulmonic Valve: The pulmonic valve was normal in structure. Pulmonic valve regurgitation is not visualized. Aorta: The aortic root and ascending aorta are structurally normal, with no evidence of dilitation. Venous: The inferior vena cava is normal in size with greater than 50% respiratory variability, suggesting right atrial pressure of 3 mmHg. IAS/Shunts: No atrial level shunt detected by color flow Doppler. LEFT VENTRICLE PLAX 2D LVIDd:         4.80 cm LVIDs:         2.90 cm LV PW:         1.20 cm LV IVS:        1.10 cm LVOT diam:     2.10 cm LV SV:         83 LVOT Area:      3.46 cm  AORTIC VALVE AV Area (Vmax):    0.79 cm AV Area (VTI):     0.90 cm AV Vmax:           423.00 cm/s AV VTI:            0.923 m AV Peak Grad:      71.6 mmHg AV Mean Grad:      46.0 mmHg LVOT Vmax:         96.90 cm/s LVOT VTI:          0.241 m LVOT/AV  VTI ratio: 0.26  AORTA Ao Root diam: 3.10 cm Ao Asc diam:  3.30 cm MV E velocity: 112.00 cm/s MV A velocity: 127.00 cm/s  SHUNTS MV E/A ratio:  0.88         Systemic VTI:  0.24 m                             Systemic Diam: 2.10 cm Laurance Flatten MD Electronically signed by Laurance Flatten MD Signature Date/Time: 01/17/2020/6:08:26 PM    Final      Labs:   Basic Metabolic Panel: Recent Labs  Lab 01/17/20 1002 01/18/20 0248 01/19/20 1050 01/19/20 1051 01/19/20 1101 01/20/20 0156  NA 142 138 143 144 143 142  K 4.6 5.0 4.3 4.4 4.3 4.0  CL 104 104  --   --   --  105  CO2 24 24  --   --   --  28  GLUCOSE 95 102*  --   --   --  105*  BUN 12 13  --   --   --  10  CREATININE 1.03* 1.01*  --   --   --  1.00  CALCIUM 9.9 8.7*  --   --   --  9.1   GFR Estimated Creatinine Clearance: 93.6 mL/min (by C-G formula based on SCr of 1 mg/dL). Liver Function Tests: No results for input(s): AST, ALT, ALKPHOS, BILITOT, PROT, ALBUMIN in the last 168 hours. No results for input(s): LIPASE, AMYLASE in the last 168 hours. No results for input(s): AMMONIA in the last 168 hours. Coagulation profile Recent Labs  Lab 01/17/20 1509 01/18/20 0248 01/19/20 0354 01/20/20 0156  INR 1.3* 1.2 1.2 1.2    CBC: Recent Labs  Lab 01/17/20 1002 01/18/20 0248 01/19/20 0354 01/19/20 1050 01/19/20 1051 01/19/20 1101 01/20/20 0156  WBC 4.1 4.5 4.1  --   --   --  4.3  HGB 11.0* 10.0* 9.7* 10.5* 10.9* 10.2* 10.0*  HCT 38.6 32.4* 33.4* 31.0* 32.0* 30.0* 34.7*  MCV 81.6 78.8* 80.5  --   --   --  79.8*  PLT 209 PLATELET CLUMPS NOTED ON SMEAR, UNABLE TO ESTIMATE PLATELET CLUMPS NOTED ON SMEAR, UNABLE TO ESTIMATE  --   --   --  176   Cardiac Enzymes: No  results for input(s): CKTOTAL, CKMB, CKMBINDEX, TROPONINI in the last 168 hours. BNP: Invalid input(s): POCBNP CBG: No results for input(s): GLUCAP in the last 168 hours. D-Dimer No results for input(s): DDIMER in the last 72 hours. Hgb A1c No results for input(s): HGBA1C in the last 72 hours. Lipid Profile No results for input(s): CHOL, HDL, LDLCALC, TRIG, CHOLHDL, LDLDIRECT in the last 72 hours. Thyroid function studies Recent Labs    01/18/20 0248  TSH 0.420   Anemia work up Recent Labs    01/19/20 0354  FERRITIN 28  TIBC 349  IRON 33   Microbiology Recent Results (from the past 240 hour(s))  Resp Panel by RT-PCR (Flu A&B, Covid) Nasopharyngeal Swab     Status: None   Collection Time: 01/17/20  3:37 PM   Specimen: Nasopharyngeal Swab; Nasopharyngeal(NP) swabs in vial transport medium  Result Value Ref Range Status   SARS Coronavirus 2 by RT PCR NEGATIVE NEGATIVE Final    Comment: (NOTE) SARS-CoV-2 target nucleic acids are NOT DETECTED.  The SARS-CoV-2 RNA is generally detectable in upper respiratory specimens during the acute phase of infection. The lowest concentration of SARS-CoV-2  viral copies this assay can detect is 138 copies/mL. A negative result does not preclude SARS-Cov-2 infection and should not be used as the sole basis for treatment or other patient management decisions. A negative result may occur with  improper specimen collection/handling, submission of specimen other than nasopharyngeal swab, presence of viral mutation(s) within the areas targeted by this assay, and inadequate number of viral copies(<138 copies/mL). A negative result must be combined with clinical observations, patient history, and epidemiological information. The expected result is Negative.  Fact Sheet for Patients:  BloggerCourse.com  Fact Sheet for Healthcare Providers:  SeriousBroker.it  This test is no t yet approved or  cleared by the [place] FDA and  has been authorized for detection and/or diagnosis of SARS-CoV-2 by FDA under an Emergency Use Authorization (EUA). This EUA will remain  in effect (meaning this test can be used) for the duration of the COVID-19 declaration under Section 564(b)(1) of the Act, 21 U.S.C.section 360bbb-3(b)(1), unless the authorization is terminated  or revoked sooner.       Influenza A by PCR NEGATIVE NEGATIVE Final   Influenza B by PCR NEGATIVE NEGATIVE Final    Comment: (NOTE) The Xpert Xpress SARS-CoV-2/FLU/RSV plus assay is intended as an aid in the diagnosis of influenza from Nasopharyngeal swab specimens and should not be used as a sole basis for treatment. Nasal washings and aspirates are unacceptable for Xpert Xpress SARS-CoV-2/FLU/RSV testing.  Fact Sheet for Patients: BloggerCourse.com  Fact Sheet for Healthcare Providers: SeriousBroker.it  This test is not yet approved or cleared by the [place] FDA and has been authorized for detection and/or diagnosis of SARS-CoV-2 by FDA under an Emergency Use Authorization (EUA). This EUA will remain in effect (meaning this test can be used) for the duration of the COVID-19 declaration under Section 564(b)(1) of the Act, 21 U.S.C. section 360bbb-3(b)(1), unless the authorization is terminated or revoked.  Performed at [facility], [address] [place], [place] [address]   MRSA PCR Screening     Status: None   Collection Time: 01/17/20  8:49 PM   Specimen: Nasal Mucosa; Nasopharyngeal  Result Value Ref Range Status   MRSA by PCR NEGATIVE NEGATIVE Final    Comment:        The GeneXpert MRSA Assay (FDA approved for NASAL specimens only), is one component of a comprehensive MRSA colonization surveillance program. It is not intended to diagnose MRSA infection nor to guide or monitor treatment for MRSA infections. Performed at [facility], [address], [place], [place] [address]      Discharge Instructions:   Discharge Instructions    Diet general   Complete by: As directed    Discharge instructions   Complete by: As directed     of warfarin daily through the weekend Friday, Saturday, and Sunday then resume prior regimen INR check on Monday   Increase activity slowly   Complete by: As directed      Allergies as of 01/20/2020      Reactions   Lisinopril Cough   Lovenox [enoxaparin Sodium] Itching   Moxifloxacin Other (See Comments)   REACTION: hallucinations   Lovenox [enoxaparin]    Itching       Medication List    TAKE these medications   Bariatric Multivitamins/Iron Caps Take 1 tablet by mouth daily.   bismuth subsalicylate 262 MG/15ML suspension Commonly known as: PEPTO BISMOL Take 30 mLs by mouth every 6 (six) hours as needed for indigestion or  diarrhea or loose stools.   calcium carbonate 500 MG chewable tablet Commonly known as: TUMS - dosed in mg elemental calcium Chew 1-2 tablets by mouth 3 (three) times daily as needed for indigestion or heartburn.   cyanocobalamin 1000 MCG/ML injection Commonly known as: (VITAMIN B-12) Inject 1,000 mcg into the muscle every 30 (thirty) days.   enoxaparin 150 MG/ML injection Commonly known as: LOVENOX Inject 0.94 mLs (140 mg total) into the skin 2 (two) times daily for 7 days.   Ferrex 150 150 MG capsule Generic drug: iron polysaccharides TAKE 1 CAPSULE BY MOUTH EVERY DAY What changed: how much to take   hydroxychloroquine 200 MG tablet Commonly known as: PLAQUENIL Take 200 mg by mouth 2 (two) times daily.   labetalol 200 MG tablet Commonly known as: NORMODYNE TAKE 1 AND 1/2 TABLETS BY MOUTH TWICE A DAY. What changed:   how much to take  how to take this  when to take this  additional instructions   levothyroxine 200 MCG tablet Commonly known as: SYNTHROID TAKE 1 TABLET (200 MCG TOTAL) BY MOUTH DAILY BEFORE BREAKFAST.    omeprazole 20 MG capsule Commonly known as: PRILOSEC TAKE 1 CAPSULE (20 MG TOTAL) BY MOUTH 2 (TWO) TIMES DAILY BEFORE A MEAL.   PARoxetine 40 MG tablet Commonly known as: PAXIL Take 1 tablet (40 mg total) by mouth every morning. What changed: when to take this   rosuvastatin 20 MG tablet Commonly known as: CRESTOR Take 1 tablet (20 mg total) by mouth daily. What changed: when to take this   warfarin 7.5 MG tablet Commonly known as: COUMADIN Take as directed. If you are unsure how to take this medication, talk to your nurse or doctor. Original instructions: TAKE 1 & 1/2 TABLETS DAILY EXCEPT 1 TABLET ON MON AND THURS OR AS DIRECTED BY CLINIC 90 DAY SUPPLY What changed:   how much to take  how to take this  when to take this  additional instructions       Follow-up Information    Alleen Borne, MD. Go on 02/15/2020.   Specialty: Cardiothoracic Surgery Why: @ 1pm to discuss your aortic valve Contact information: [address] [employer] [address] [place] [address] [phone]        Judy Pimple, MD Follow up in 1 week(s).   Specialties: Family Medicine, Radiology Contact information: [address] [place] [address] [phone]        Pricilla Riffle, MD .   Specialty: Cardiology Contact information: [address] [place] [address] [phone]                Time coordinating discharge: 35 min  Signed:  Joseph Art DO  [facility] 01/20/2020, 2:23 PM

## 2020-01-23 ENCOUNTER — Telehealth: Payer: Self-pay

## 2020-01-23 NOTE — Telephone Encounter (Signed)
Transition Care Management Follow-up Telephone Call  Date of discharge and from where: 01/20/2020, [facility]   How have you been since you were released from the hospital? Patient states that she is feeling better just tired.   Any questions or concerns? No  Items Reviewed:  Did the pt receive and understand the discharge instructions provided? Yes   Medications obtained and verified? Yes   Other? No   Any new allergies since your discharge? No  Dietary orders reviewed? Yes  Do you have support at home? Yes   Home Care and Equipment/Supplies: Were home health services ordered? not applicable If so, what is the name of the agency? N/A  Has the agency set up a time to come to the patient's home? not applicable Were any new equipment or medical supplies ordered?  No What is the name of the medical supply agency? N/A Were you able to get the supplies/equipment? not applicable Do you have any questions related to the use of the equipment or supplies? No  Functional Questionnaire: (I = Independent and D = Dependent) ADLs: I  Bathing/Dressing- I  Meal Prep- I  Eating- I  Maintaining continence- I  Transferring/Ambulation- I  Managing Meds- I  Follow up appointments reviewed:   PCP Hospital f/u appt confirmed? Yes  Scheduled to see Dr. Milinda Antis on 01/24/2020 @ 2 pm.  Specialist Hospital f/u appt confirmed? Yes  Scheduled to see cardiology   Are transportation arrangements needed? No   If their condition worsens, is the pt aware to call PCP or go to the Emergency Dept.? Yes  Was the patient provided with contact information for the PCP's office or ED? Yes  Was to pt encouraged to call back with questions or concerns? Yes

## 2020-01-24 ENCOUNTER — Ambulatory Visit: Payer: 59 | Admitting: Family Medicine

## 2020-01-25 ENCOUNTER — Other Ambulatory Visit: Payer: Self-pay | Admitting: Family Medicine

## 2020-01-25 NOTE — Telephone Encounter (Signed)
Hospital f/u scheduled tomorrow, will hold until appt incase PCP changes anything

## 2020-01-26 ENCOUNTER — Ambulatory Visit (INDEPENDENT_AMBULATORY_CARE_PROVIDER_SITE_OTHER): Payer: 59 | Admitting: General Practice

## 2020-01-26 ENCOUNTER — Other Ambulatory Visit: Payer: Self-pay

## 2020-01-26 ENCOUNTER — Ambulatory Visit (INDEPENDENT_AMBULATORY_CARE_PROVIDER_SITE_OTHER): Payer: 59 | Admitting: Family Medicine

## 2020-01-26 ENCOUNTER — Encounter: Payer: Self-pay | Admitting: Gastroenterology

## 2020-01-26 ENCOUNTER — Ambulatory Visit (INDEPENDENT_AMBULATORY_CARE_PROVIDER_SITE_OTHER): Payer: 59 | Admitting: Gastroenterology

## 2020-01-26 ENCOUNTER — Telehealth: Payer: Self-pay

## 2020-01-26 ENCOUNTER — Other Ambulatory Visit: Payer: Self-pay | Admitting: General Practice

## 2020-01-26 ENCOUNTER — Encounter: Payer: Self-pay | Admitting: Family Medicine

## 2020-01-26 VITALS — BP 128/66 | HR 80 | Temp 96.9°F | Ht 65.0 in | Wt 314.4 lb

## 2020-01-26 VITALS — BP 118/80 | HR 78 | Ht 65.0 in | Wt 314.0 lb

## 2020-01-26 DIAGNOSIS — E538 Deficiency of other specified B group vitamins: Secondary | ICD-10-CM | POA: Diagnosis not present

## 2020-01-26 DIAGNOSIS — J869 Pyothorax without fistula: Secondary | ICD-10-CM | POA: Diagnosis not present

## 2020-01-26 DIAGNOSIS — I350 Nonrheumatic aortic (valve) stenosis: Secondary | ICD-10-CM

## 2020-01-26 DIAGNOSIS — I10 Essential (primary) hypertension: Secondary | ICD-10-CM | POA: Diagnosis not present

## 2020-01-26 DIAGNOSIS — R933 Abnormal findings on diagnostic imaging of other parts of digestive tract: Secondary | ICD-10-CM | POA: Diagnosis not present

## 2020-01-26 DIAGNOSIS — R112 Nausea with vomiting, unspecified: Secondary | ICD-10-CM | POA: Diagnosis not present

## 2020-01-26 DIAGNOSIS — K224 Dyskinesia of esophagus: Secondary | ICD-10-CM | POA: Diagnosis not present

## 2020-01-26 DIAGNOSIS — Z86711 Personal history of pulmonary embolism: Secondary | ICD-10-CM | POA: Diagnosis not present

## 2020-01-26 DIAGNOSIS — D6851 Activated protein C resistance: Secondary | ICD-10-CM | POA: Diagnosis not present

## 2020-01-26 DIAGNOSIS — Z7901 Long term (current) use of anticoagulants: Secondary | ICD-10-CM

## 2020-01-26 DIAGNOSIS — Z9884 Bariatric surgery status: Secondary | ICD-10-CM

## 2020-01-26 DIAGNOSIS — R1319 Other dysphagia: Secondary | ICD-10-CM

## 2020-01-26 LAB — POCT INR: INR: 2.3 (ref 2.0–3.0)

## 2020-01-26 MED ORDER — NITROGLYCERIN 0.4 MG SL SUBL: SUBLINGUAL_TABLET | SUBLINGUAL | 3 refills | Status: DC

## 2020-01-26 MED ORDER — CYANOCOBALAMIN 1000 MCG/ML IJ SOLN
1000.0000 ug | Freq: Once | INTRAMUSCULAR | Status: AC
  Administered 2020-01-26: 1000 ug via INTRAMUSCULAR

## 2020-01-26 NOTE — Patient Instructions (Signed)
Pre visit review using our clinic review tool, if applicable. No additional management support is needed unless otherwise documented below in the visit note.  Take 2 tablets today and then change dosage and take  1 1/2 tablets daily except 2 tablets on  Sundays Tuesdays and Thursdays.  Re-check in 3 weeks.  Give B-12 as well.

## 2020-01-26 NOTE — Progress Notes (Signed)
Subjective:    Patient ID: [name], female    DOB: 1965-05-26, 54 y.o.   MRN: [id number]  This visit occurred during the SARS-CoV-2 public health emergency.  Safety protocols were in place, including screening questions prior to the visit, additional usage of staff PPE, and extensive cleaning of exam room while observing appropriate contact time as indicated for disinfecting solutions.    HPI Pt presents for hospital f/u  She was hosp from 12/14 to 01/20/20 for chest pain with stable chronic L lung empyema  She presented on day of admit with L sided chest pain and funny feeling in arm (not exertional) , with occ cough and occ sob over past week   hosp course by problem:   Chest pain- GI vs cardiac - worsening AS seen on echo- cards consult- s/p echo-- await plans per cards re: valve follow up -PPI BID, was to follow up with General Surgery for: consideration of diverticulectomy or diverticulopexy-has GI follow up 12/23  This keeps her nauseated    L lung empyema- chronic and stable -pulmonary consult appreciated  Neg flu and covid tests  Neg mrsa test   SLE/Factor V Leiden -coumadin with lovenox bridge -Continue Plaquenil for now  HTN -Continue Labetalol  HLD -Continue Crestor  Anxiety -Continue Paxil  Hypothyroidism -TSH WNL -Continue Synthroid at current dose for now  OSA on CPAP -Continue CPAP QHS  Obesity Body mass index is 52.35 kg/m.  CT chest IMPRESSION: Chronic thick rimmed loculated left pleural effusion is stable since 2011. The thick rind is now partially calcified. Adjacent compressive atelectasis or scarring in the left lower lobe. Fluid-filled esophagus which may be related to reflux or dysmotility. Small hiatal hernia. Coronary artery disease. Aortic Atherosclerosis (ICD10-I70.0).   Echocardiogram  Mild to mod concentric L ventricular hypertrophy Grade 2 diastolic dysfunction  Small pericardial effusion  Mod calc and thickening  of aortic valve  EF 65-70 %  Wt Readings from Last 3 Encounters:  01/26/20 (!) 314 lb 6 oz (142.6 kg)  01/26/20 (!) 314 lb (142.4 kg)  01/20/20 (!) 313 lb 9.6 oz (142.2 kg)   52.31 kg/m  BP Readings from Last 3 Encounters:  01/26/20 128/66  01/26/20 118/80  01/20/20 111/74   Pulse Readings from Last 3 Encounters:  01/26/20 80  01/26/20 78  01/20/20 75   She had f/u with GI today  (n/v)  for large pulsion diverticulum  ppi was inc to bid  Surgical ref planced to disc surg tx   Lab Results  Component Value Date   CREATININE 1.00 01/20/2020   BUN 10 01/20/2020   NA 142 01/20/2020   K 4.0 01/20/2020   CL 105 01/20/2020   CO2 28 01/20/2020   Lab Results  Component Value Date   ALT 18 05/27/2019   AST 22 05/27/2019   ALKPHOS 74 05/27/2019   BILITOT 0.3 05/27/2019   Lab Results  Component Value Date   WBC 4.3 01/20/2020   HGB 10.0 (L) 01/20/2020   HCT 34.7 (L) 01/20/2020   MCV 79.8 (L) 01/20/2020   PLT 176 01/20/2020    She has f/u with Dr Laneta Simmers and Dr Iver Nestle  (cardiothor and cardio) next mo  No longer has chest pain since last week Breathing is ok  Was tired   She did stop her lovenox - allergic to it (itching)  Lab Results  Component Value Date   INR 2.3 01/26/2020   INR 1.2 01/20/2020   INR 1.2 01/19/2020  PROTIME 15.6 (H) 12/06/2012   PROTIME 19.2 (H) 06/03/2012   Had covid vaccines and booster also   Patient Active Problem List   Diagnosis Date Noted  . Empyema lung (HCC) 01/17/2020  . Chest pain of uncertain etiology 01/17/2020  . Esophageal diverticulum   . Esophageal dysphagia   . Fatigue 05/20/2019  . 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 Aortic stenosis 08/15/2017  . Vitamin B12 deficiency 11/15/2015  . Nausea & vomiting 10/15/2015  . Allergic rhinitis 07/01/2015  . History of cerebral hemorrhage 07/01/2015  . Chronic daily headache 06/29/2015  . Screening for osteoporosis 11/04/2013  . Encounter  for therapeutic drug monitoring 03/25/2013  . CKD (chronic kidney disease) stage 2, GFR 60-89 ml/min 12/20/2012  . 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 Routine general medical examination at a health care facility 12/07/2012  . Steroid long-term use 12/07/2012  . History of HPV infection 12/07/2012  . Long term (current) use of anticoagulants 10/06/2012  . Chronic anticoagulation 04/22/2012  . S/P bariatric surgery 04/22/2012  . Factor V Leiden (HCC) 04/22/2012  . OSA on CPAP 11/20/2011  . Prediabetes 09/16/2010  . EDEMA 01/08/2010  . PLEURAL EFFUSION 08/27/2009  . ENLARGEMENT OF LYMPH NODES 08/13/2009  . History of pulmonary embolism 05/24/2009  . Morbid obesity (HCC) 12/08/2007  . Hypothyroidism 06/09/2006  . HYPERCHOLESTEROLEMIA 06/09/2006  . Generalized anxiety disorder 06/09/2006  . PANIC DISORDER 06/09/2006  . Essential hypertension 06/09/2006  . GLOMERULONEPHRITIS 06/09/2006  . Systemic lupus erythematosus (HCC) 06/09/2006  . DVT, HX OF 06/09/2006   Past Medical History:  Diagnosis Date  . Arthritis    Lupus - hands/knees  . 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 GERD (gastroesophageal reflux disease)    on prilosec r/t gastric sleeve surgery  . HLD (hyperlipidemia)   . Iron deficiency anemia    IV dextran [place]  . Nephritis and nephropathy, not specified as acute or chronic, with unspecified pathological lesion in kidney   . Nonspecific abnormal results of liver function study   . Obesity, unspecified   . Other specified acquired hypothyroidism   . Panic disorder without agoraphobia   . Personal history of venous thrombosis and embolism 2002   during pregnancy; ?factor 5 leiden (sees heme)  . Pleural effusion 2005   c/w lupus initial w/u; recurrent Right as pred tapered off July 2011  . Sleep apnea   . Systemic lupus erythematosus (HCC)    renal  GN, hx of pericardial eff in late 90's  . Unspecified essential hypertension    Past Surgical History:  Procedure Laterality Date  . BIOPSY  04/12/2019   Procedure: BIOPSY;  Surgeon: Tressia Danas, MD;  Location: [facility];  Service: Gastroenterology;;  . ESOPHAGEAL MANOMETRY N/A 07/15/2019   Procedure: ESOPHAGEAL MANOMETRY (EM);  Surgeon: Napoleon Form, MD;  Location: [facility];  Service: Endoscopy;  Laterality: N/A;  . ESOPHAGOGASTRODUODENOSCOPY (EGD) WITH PROPOFOL N/A 04/12/2019   Procedure: ESOPHAGOGASTRODUODENOSCOPY (EGD) WITH PROPOFOL;  Surgeon: Tressia Danas, MD;  Location: [facility];  Service: Gastroenterology;  Laterality: N/A;  . gastric sleeve  8/12   bariatric surgery Dr Adolphus Birchwood   . LAPAROSCOPIC TUBAL LIGATION  12/09/2010   Procedure: LAPAROSCOPIC TUBAL LIGATION;  Surgeon: Juluis Mire;  Location: [facility];  Service: Gynecology;  Laterality: Bilateral;  Attempted see Nursing note  . pleurocentesis  10/2004  .  Pleuryx catheter placement  9/11    VanTright----removed 9/11  . RENAL BIOPSY  09/24/2012  . RIGHT/LEFT HEART CATH AND CORONARY ANGIOGRAPHY N/A 01/19/2020   Procedure: RIGHT/LEFT HEART CATH AND CORONARY ANGIOGRAPHY;  Surgeon: Runell Gess, MD;  Location: [facility];  Service: Cardiovascular;  Laterality: N/A;  . svd     x 3  . THORACENTESIS  2010   with penumonia  . WISDOM TOOTH EXTRACTION     Social History   Tobacco Use  . Smoking status: Former Smoker    Packs/day: 0.50    Years: 10.00    Pack years: 5.00    Types: Cigarettes    Quit date: 05/04/2009    Years since quitting: 10.7  . Smokeless tobacco: Never Used  . Tobacco comment: Socially x10 years (1 pack/month)  Vaping Use  . Vaping Use: Never used  Substance Use Topics  . Alcohol use: Yes    Alcohol/week: 0.0 standard drinks    Comment: rare  . Drug use: No   Family History  Problem Relation Age of Onset  . Lupus Sister   . Hypertension Father   . Hyperlipidemia  Father   . Leukemia Sister   . Diabetes Other        GM  . Colon cancer Neg Hx   . Esophageal cancer Neg Hx   . Pancreatic cancer Neg Hx    Allergies  Allergen Reactions  . Lisinopril Cough  . Lovenox [Enoxaparin Sodium] Itching  . Moxifloxacin Other (See Comments)    REACTION: hallucinations  . Lovenox [Enoxaparin]     Itching    Current Outpatient Medications on File Prior to Visit  Medication Sig Dispense Refill  . bismuth subsalicylate (PEPTO BISMOL) 262 MG/15ML suspension Take 30 mLs by mouth every 6 (six) hours as needed for indigestion or diarrhea or loose stools.    . calcium carbonate (TUMS - DOSED IN MG ELEMENTAL CALCIUM) 500 MG chewable tablet Chew 1-2 tablets by mouth 3 (three) times daily as needed for indigestion or heartburn.    . cyanocobalamin (,VITAMIN B-12,) 1000 MCG/ML injection Inject 1,000 mcg into the muscle every 30 (thirty) days.     Marland Kitchen FERREX 150 150 MG capsule TAKE 1 CAPSULE BY MOUTH EVERY DAY (Patient taking differently: Take 150 mg by mouth daily.) 90 capsule 0  . hydroxychloroquine (PLAQUENIL) 200 MG tablet Take 200 mg by mouth 2 (two) times daily.    Marland Kitchen levothyroxine (SYNTHROID) 200 MCG tablet TAKE 1 TABLET (200 MCG TOTAL) BY MOUTH DAILY BEFORE BREAKFAST. 90 tablet 1  . Multiple Vitamins-Minerals (BARIATRIC MULTIVITAMINS/IRON) CAPS Take 1 tablet by mouth daily.    . nitroGLYCERIN (NITROSTAT) 0.4 MG SL tablet Take 1 tablet sublingual 10-15 minutes before meals, three times daily 50 tablet 3  . omeprazole (PRILOSEC) 20 MG capsule TAKE 1 CAPSULE (20 MG TOTAL) BY MOUTH 2 (TWO) TIMES DAILY BEFORE A MEAL. 180 capsule 1  . warfarin (COUMADIN) 7.5 MG tablet TAKE 1 & 1/2 TABLETS DAILY EXCEPT 1 TABLET ON MON AND THURS OR AS DIRECTED BY CLINIC 90 DAY SUPPLY (Patient taking differently: Take 11.25-15 mg by mouth See admin instructions. Take 15 MG 3 times a week. Rest of the week 1.5 tablet daily) 145 tablet 0  . labetalol (NORMODYNE) 200 MG tablet TAKE 1 AND 1/2 TABLETS  BY MOUTH TWICE A DAY 270 tablet 3  . PARoxetine (PAXIL) 40 MG tablet TAKE 1 TABLET BY MOUTH EVERY DAY IN THE MORNING 90 tablet 3  .  rosuvastatin (CRESTOR) 20 MG tablet TAKE 1 TABLET BY MOUTH EVERY DAY 90 tablet 3   No current facility-administered medications on file prior to visit.    Review of Systems  Constitutional: Positive for fatigue. Negative for activity change, appetite change, fever and unexpected weight change.  HENT: Positive for trouble swallowing. Negative for congestion, ear pain, rhinorrhea, sinus pressure and sore throat.   Eyes: Negative for pain, redness and visual disturbance.  Respiratory: Negative for cough, shortness of breath and wheezing.   Cardiovascular: Negative for chest pain and palpitations.       Chest pain is resolved  Gastrointestinal: Positive for nausea and vomiting. Negative for abdominal pain, blood in stool, constipation and diarrhea.       Foot intol  Endocrine: Negative for polydipsia and polyuria.  Genitourinary: Negative for dysuria, frequency and urgency.  Musculoskeletal: Negative for arthralgias, back pain and myalgias.  Skin: Negative for pallor and rash.  Allergic/Immunologic: Negative for environmental allergies.  Neurological: Negative for dizziness, syncope and headaches.  Hematological: Negative for adenopathy. Does not bruise/bleed easily.  Psychiatric/Behavioral: Negative for decreased concentration and dysphoric mood. The patient is not nervous/anxious.        Objective:   Physical Exam Constitutional:      General: She is not in acute distress.    Appearance: Normal appearance. She is well-developed and well-nourished. She is obese. She is not ill-appearing or diaphoretic.  HENT:     Head: Normocephalic and atraumatic.     Mouth/Throat:     Mouth: Oropharynx is clear and moist.  Eyes:     General: No scleral icterus.    Extraocular Movements: EOM normal.     Conjunctiva/sclera: Conjunctivae normal.     Pupils: Pupils are  equal, round, and reactive to light.  Neck:     Thyroid: No thyromegaly.     Vascular: No carotid bruit or JVD.  Cardiovascular:     Rate and Rhythm: Normal rate and regular rhythm.     Pulses: Intact distal pulses.     Heart sounds: Murmur heard.  No gallop.   Pulmonary:     Effort: Pulmonary effort is normal. No respiratory distress.     Breath sounds: Normal breath sounds. No wheezing or rales.     Comments: No crackles Abdominal:     General: Bowel sounds are normal. There is no distension or abdominal bruit.     Palpations: Abdomen is soft. There is no mass.     Tenderness: There is no abdominal tenderness. There is no guarding or rebound.  Musculoskeletal:        General: No edema.     Cervical back: Normal range of motion and neck supple.     Comments: Trace pedal edema (baseline)   Lymphadenopathy:     Cervical: No cervical adenopathy.  Skin:    General: Skin is warm and dry.     Coloration: Skin is not pale.     Findings: No erythema or rash.  Neurological:     Mental Status: She is alert.     Coordination: Coordination normal.     Deep Tendon Reflexes: Reflexes are normal and symmetric.  Psychiatric:        Mood and Affect: Mood and affect and mood normal.           Assessment & Plan:   Problem List Items Addressed This Visit      Cardiovascular and Mediastinum   Essential hypertension    bp in fair control at  this time  BP Readings from Last 1 Encounters:  01/26/20 128/66   No changes needed Most recent labs reviewed  Disc lifstyle change with low sodium diet and exercise  Plan to continue labetalol 200 mg 1 1/2 tabs bid      Aortic stenosis - Primary    Reviewed hospital records, lab results and studies in detail   Rev echo  Plan for f/u with cardiology No symptoms  Cp is resolved        Respiratory   Empyema lung [facility](HCC)    Reviewed hospital records, lab results and studies in detail  Stable  Not source of cp For f/u with cardiothoracic  surg as planned          Digestive   Nausea & vomiting    Likely due to large pulsion diverticulum  GI f/u was today and surg ref placed      Esophageal dysphagia    Due to large plusion diverticulum GI f/u today and surg consult was obt         Other   Morbid obesity (HCC)    S/p bariatric surgery  Issues with diverticula have prevented good eating  Hopes to have this addressed  Continues bid ppi  Small meals Exercise when tolerated      Chronic anticoagulation    Lab Results  Component Value Date   INR 2.3 01/26/2020   INR 1.2 01/20/2020   INR 1.2 01/19/2020   PROTIME 15.6 (H) 12/06/2012   PROTIME 19.2 (H) 06/03/2012

## 2020-01-26 NOTE — Telephone Encounter (Signed)
°  [facility]   MULTIDISCIPLINARY HEART VALVE TEAM  Multidisciplinary heart valve team reviewed the pt's 12/17 TAVR CT scans.  The CTA chest/abdomen/pelvis was non diagnostic for looking at TAVR access.  The team has requested that this test be repeated prior to the pt's evaluation with Dr Laneta Simmers on 02/15/20.  I have left the pt a message to contact me to arrange CT.

## 2020-01-26 NOTE — Telephone Encounter (Signed)
Pt returned my call and I have scheduled her for CTA on 12/28 at 10:45 AM.  Instructions for test given to pt by phone and sent through My Chart.   You are scheduled for CT on Tuesday, January 31, 2020 at [facility] (Main Entrance A, Valet Parking). Please arrive at 10:15 AM in Radiology (first floor) for check in.    . Drink plenty of water. Do not drink any water within one hour of the test. . Do not eat any food 4 hours prior to the test, nothing after 6:45 AM.  . You may take your regular medications prior to the test.

## 2020-01-26 NOTE — Patient Instructions (Addendum)
If you are age 53 or older, your body mass index should be between 23-30. Your Body mass index is 52.25 kg/m. If this is out of the aforementioned range listed, please consider follow up with your Primary Care Provider.  If you are age 70 or younger, your body mass index should be between 19-25. Your Body mass index is 52.25 kg/m. If this is out of the aformentioned range listed, please consider follow up with your Primary Care Provider.   Your esophageal manometry results show absent esophageal activity and that you are not moving a food bolus through your esophagus very well.  However, the etiology for these changes is still not clear.  Given these results I recommend:  -Continue on your omeprazole 40 mg twice daily -Trial of nitroglycerin 0.4 mg taken sublingual 10 to 15 minutes prior to meals.  Nitroglycerin may cause headaches, dizziness, lightheadedness, nausea, and flushing.  Headache is often a sign that this medication is working.  However, please let me know if any of these symptoms develop.   -I would like for you to see an esophageal specialist at [facility]  Have a [employer] Christmas.  Please contact me with any questions or concerns prior to your appointment at [facility].  Thank you for entrusting me with your care and choosing [facility].  Dr. Orvan Falconer

## 2020-01-26 NOTE — Patient Instructions (Signed)
So glad you are doing better  Thinks look stable  Follow up with the GI specialist and cardiology/cardiothoracic   If chest pain returns or you develop shortness of breath of cough let us know and get help where you are

## 2020-01-26 NOTE — Progress Notes (Signed)
Medical screening examination/treatment/procedure(s) were performed by non-physician practitioner and as supervising physician I was immediately available for consultation/collaboration. I agree with above. Maurene Hollin, MD   

## 2020-01-26 NOTE — Progress Notes (Signed)
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 Reflux may be contributing. However, esophageal manometry showed absent esophageal contractility with poor bolus clearance.  Achalasia is in the differential even though she has normal integrated relaxation pressure. Symptoms may also be due to the large pulsion diverticulum.  Dr. Doylene Canard was considering gastric bypass to treat her reflux and obesity but wanted additional evaluation of her stricture.  Will refer to Esophageal Clinic at [facility] to consider EndoFLIP. Trial of sublingual nitroglycerin 0.4 mg taken 10-15 minutes prior to meals recommended in the meantime. Reviewed side effects that may include dizziness, headache, lightheadedness, nausea, and flushing.       PLAN: - Continue Omperazole to 40 mg BID - Trial of nitroglycerin 0.4 mg SL taken 10-15 minutes prior to meals (#50 with 3 refills) - Referral to [facility] to see an esophagologist    HPI: [name] is a 54 y.o. female who returns in follow-up in the evaluation for nausea and vomiting. 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 She has a history of DVT/PE due to Factor V Leiden and is on warfarin through the [facility].   Gastric sleeve performed at [facility] around 2012.  No complications surrounding the procedure, and had an easy recovery with good weight loss.  Had immediate post-op care but has not followed up with a surgeon or bariatric clinic since that time.  She takes a a vitabrand general multivitamin daily, b12 injection monthly, uses iron supplements. Has required iron infusions on a few occassions since her surgery.    A few years after the operation she developed reflux and started omeprazole. Symptoms have been progressive since that time.   She continues to have intermittent post-prandial nausea and vomiting that's been occurring since her gastric sleeve. Worse over the last 3 years such that she has needed to increase her omeprazole.  Symptoms primarily start 10-15 minutes after eating and are often relieved after vomiting undigested food, although this has occurred in the middle of the night when she awakes with coughing. She also has intermittent bloating, regurgitation of food, and upper abdominal cramps. No dysphagia, odynophagia, or heartburn.  No improvement with higher doses of omeprazole, Tums PRN, and rare PeptoBismal for additional relief.  Evaluation has included: Labs 01/04/19: Normal CMP, B12 705, hgb 11.4, MCV 81.5, RDW 17.1, platelets 244, TSH 10.03, HgbA1c 5.7  Upper GI series 01/13/2019 showed a large portion diverticulum versus paraesophageal hernia in the distal esophagus with associated stricture, concurrent small hiatal hernia, marked stasis in the esophagus, and changes of sleeve gastrectomy.  EGD 04/12/2019 showed: - Dilated esophagus, most pronounced in  the distal esophagus. - Esophageal mucosal abnormality - ? diverticulum. Biopsied.  Biopsies also obtained from the mid and proximal esophagus.  All esophageal biopsies were normal. - A single hyperplastic gastric polyp.  Gastric biopsy showed mild reactive gastropathy and mild chronic gastritis.  There was no H. pylori. - Normal examined duodenum.  Biopsy showed prominent lymphoid aggregates. - No obvious source for iron deficiency identified. Await biopsy results.  Seen in consultation by Dr. Doylene Canardonner 06/22/2019 who recommended esophageal manometry.  She did not think simple diverticulopexy or diverticulectomy would be for sufficient given her sleeve anatomy and that she may ultimately require conversion to gastric bypass to treat her reflux and concurrent obesity.  Esophageal manometry 07/15/2019 showed: Normal resting EG junction pressure with complete relaxation after deglutition Esophageal contractions were 100% failed with pan esophageal pressurization No manometric evidence of hiatal hernia Normal basal and residual upper esophageal pressures Complete bolus clearance on 0/10 swallows  CT chest with contrast 01/17/20 to evaluate left chest pain: Chronic thick rimmed loculated left pleural effusion stable since 2011.  Fluid-filled esophagus.  Small hiatal hernia.  CT angio chest, abdomen and pelvis 01/20/2020: Limited study.  Hepatic steatosis.  Changes of sleeve gastrectomy.  Severe calcification of the aortic valve.  Mild cardiomegaly.  Aortic atherosclerosis.  Multiple nonspecific pulmonary nodules throughout both lungs.   Past Medical History:  Diagnosis Date  . Arthritis    Lupus - hands/knees  . 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 GERD (gastroesophageal reflux disease)    on prilosec r/t gastric sleeve surgery  . HLD (hyperlipidemia)   . Iron deficiency anemia    IV dextran [place]  . Nephritis and nephropathy, not specified as acute or chronic, with  unspecified pathological lesion in kidney   . Nonspecific abnormal results of liver function study   . Obesity, unspecified   . Other specified acquired hypothyroidism   . Panic disorder without agoraphobia   . Personal history of venous thrombosis and embolism 2002   during pregnancy; ?factor 5 leiden (sees heme)  . Pleural effusion 2005   c/w lupus initial w/u; recurrent Right as pred tapered off July 2011  . Sleep apnea   . 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 Unspecified essential hypertension     Past Surgical History:  Procedure Laterality Date  . BIOPSY  04/12/2019   Procedure: BIOPSY;  Surgeon: Tressia DanasBeavers, Chanah Tidmore, MD;  [facility];  Service: Gastroenterology;;  . ESOPHAGEAL MANOMETRY N/A 07/15/2019   Procedure: ESOPHAGEAL MANOMETRY (EM);  Surgeon: Napoleon FormNandigam, Kavitha V, MD;  Location: [facility];  Service: Endoscopy;  Laterality: N/A;  . ESOPHAGOGASTRODUODENOSCOPY (EGD) WITH PROPOFOL N/A 04/12/2019   Procedure: ESOPHAGOGASTRODUODENOSCOPY (EGD) WITH PROPOFOL;  Surgeon: Tressia DanasBeavers, Morelia Cassells, MD;  Location: [facility];  Service: Gastroenterology;  Laterality: N/A;  . gastric sleeve  8/12   bariatric surgery Dr Adolphus Birchwoodasher   . LAPAROSCOPIC TUBAL LIGATION  12/09/2010   Procedure: LAPAROSCOPIC TUBAL LIGATION;  Surgeon: Juluis MireJohn S McComb;  Location: [facility];  Service: Gynecology;  Laterality: Bilateral;  Attempted see Nursing note  . pleurocentesis  10/2004  . Pleuryx catheter placement  9/11    VanTright----removed 9/11  . RENAL BIOPSY  09/24/2012  . RIGHT/LEFT HEART CATH AND CORONARY ANGIOGRAPHY N/A 01/19/2020   Procedure: RIGHT/LEFT HEART CATH AND CORONARY ANGIOGRAPHY;  Surgeon: Runell GessBerry, Jonathan J,  MD;  Location: [facility];  Service: Cardiovascular;  Laterality: N/A;  . svd     x 3  . THORACENTESIS  2010   with penumonia  . WISDOM TOOTH EXTRACTION      Current Outpatient Medications  Medication Sig Dispense Refill  .  bismuth subsalicylate (PEPTO BISMOL) 262 MG/15ML suspension Take 30 mLs by mouth every 6 (six) hours as needed for indigestion or diarrhea or loose stools.    . calcium carbonate (TUMS - DOSED IN MG ELEMENTAL CALCIUM) 500 MG chewable tablet Chew 1-2 tablets by mouth 3 (three) times daily as needed for indigestion or heartburn.    . cyanocobalamin (,VITAMIN B-12,) 1000 MCG/ML injection Inject 1,000 mcg into the muscle every 30 (thirty) days.     Marland Kitchen FERREX 150 150 MG capsule TAKE 1 CAPSULE BY MOUTH EVERY DAY (Patient taking differently: Take 150 mg by mouth daily.) 90 capsule 0  . hydroxychloroquine (PLAQUENIL) 200 MG tablet Take 200 mg by mouth 2 (two) times daily.    Marland Kitchen labetalol (NORMODYNE) 200 MG tablet TAKE 1 AND 1/2 TABLETS BY MOUTH TWICE A DAY. (Patient taking differently: Take 300 mg by mouth in the morning and at bedtime.) 90 tablet 11  . levothyroxine (SYNTHROID) 200 MCG tablet TAKE 1 TABLET (200 MCG TOTAL) BY MOUTH DAILY BEFORE BREAKFAST. 90 tablet 1  . Multiple Vitamins-Minerals (BARIATRIC MULTIVITAMINS/IRON) CAPS Take 1 tablet by mouth daily.    Marland Kitchen omeprazole (PRILOSEC) 20 MG capsule TAKE 1 CAPSULE (20 MG TOTAL) BY MOUTH 2 (TWO) TIMES DAILY BEFORE A MEAL. 180 capsule 1  . PARoxetine (PAXIL) 40 MG tablet Take 1 tablet (40 mg total) by mouth every morning. (Patient taking differently: Take 40 mg by mouth daily.) 90 tablet 3  . rosuvastatin (CRESTOR) 20 MG tablet Take 1 tablet (20 mg total) by mouth daily. (Patient taking differently: Take 20 mg by mouth every evening.) 30 tablet 11  . warfarin (COUMADIN) 7.5 MG tablet TAKE 1 & 1/2 TABLETS DAILY EXCEPT 1 TABLET ON MON AND THURS OR AS DIRECTED BY CLINIC 90 DAY SUPPLY (Patient taking differently: Take 11.25-15 mg by mouth See admin instructions. Take 15 MG 3 times a week. Rest of the week 1.5 tablet daily) 145 tablet 0   No current facility-administered medications for this visit.    Allergies as of 01/26/2020 - Review Complete 01/26/2020   Allergen Reaction Noted  . Lisinopril Cough 12/07/2012  . Lovenox [enoxaparin sodium] Itching 12/06/2012  . Moxifloxacin Other (See Comments) 04/17/2008  . Lovenox [enoxaparin]  04/19/2019    Family History  Problem Relation Age of Onset  . Lupus Sister   . Hypertension Father   . Hyperlipidemia Father   . Leukemia Sister   . Diabetes Other        GM  . Colon cancer Neg Hx   . Esophageal cancer Neg Hx   . Pancreatic cancer Neg Hx     Social History   Socioeconomic History  . Marital status: Divorced    Spouse name: Not on file  . Number of children: 3  . Years of education: Not on file  . Highest education level: Not on file  Occupational History  . Occupation: Psychologist, educational at [employer]  . Smoking status: Former Smoker    Packs/day: 0.50    Years: 10.00    Pack years: 5.00    Types: Cigarettes    Quit date: 05/04/2009    Years since quitting: 10.7  . Smokeless tobacco:  Never Used  . Tobacco comment: Socially x10 years (1 pack/month)  Vaping Use  . Vaping Use: Never used  Substance and Sexual Activity  . Alcohol use: Yes    Alcohol/week: 0.0 standard drinks    Comment: rare  . Drug use: No  . Sexual activity: Not on file  Other Topics Concern  . Not on file  Social History Narrative  . Not on file   Social Determinants of Health   Financial Resource Strain: Not on file  Food Insecurity: Not on file  Transportation Needs: Not on file  Physical Activity: Not on file  Stress: Not on file  Social Connections: Not on file  Intimate Partner Violence: Not on file    Physical Exam: General:   Alert,  well-nourished, pleasant and cooperative in NAD Head:  Normocephalic and atraumatic. Eyes:  Sclera clear, no icterus.   Conjunctiva pink. Abdomen:  Soft, obese, nontender, nondistended, normal bowel sounds, no rebound or guarding. Well-healed laproscopic scars. No hepatosplenomegaly.   Neurologic:  Alert and  oriented x4;  grossly nonfocal Skin:   Intact without significant lesions or rashes. Psych:  Alert and cooperative. Normal mood and affect.  Ellianna Ruest L. Orvan Falconer, MD, MPH 01/26/2020, 10:55 AM

## 2020-01-29 NOTE — Assessment & Plan Note (Signed)
Due to large plusion diverticulum GI f/u today and surg consult was obt

## 2020-01-29 NOTE — Assessment & Plan Note (Signed)
Lab Results  Component Value Date   INR 2.3 01/26/2020   INR 1.2 01/20/2020   INR 1.2 01/19/2020   PROTIME 15.6 (H) 12/06/2012   PROTIME 19.2 (H) 06/03/2012

## 2020-01-29 NOTE — Assessment & Plan Note (Signed)
bp in fair control at this time  BP Readings from Last 1 Encounters:  01/26/20 128/66   No changes needed Most recent labs reviewed  Disc lifstyle change with low sodium diet and exercise  Plan to continue labetalol 200 mg 1 1/2 tabs bid

## 2020-01-29 NOTE — Assessment & Plan Note (Signed)
Likely due to large pulsion diverticulum  GI f/u was today and surg ref placed

## 2020-01-29 NOTE — Assessment & Plan Note (Signed)
S/p bariatric surgery  Issues with diverticula have prevented good eating  Hopes to have this addressed  Continues bid ppi  Small meals Exercise when tolerated

## 2020-01-29 NOTE — Assessment & Plan Note (Signed)
Reviewed hospital records, lab results and studies in detail  Stable  Not source of cp For f/u with cardiothoracic surg as planned

## 2020-01-29 NOTE — Assessment & Plan Note (Signed)
Reviewed hospital records, lab results and studies in detail   Rev echo  Plan for f/u with cardiology No symptoms  Cp is resolved

## 2020-01-31 ENCOUNTER — Ambulatory Visit (HOSPITAL_COMMUNITY)
Admission: RE | Admit: 2020-01-31 | Discharge: 2020-01-31 | Disposition: A | Discharge status: Home / Self Care | Payer: 59 | Source: Ambulatory Visit | Attending: Surgery | Admitting: Surgery

## 2020-01-31 ENCOUNTER — Other Ambulatory Visit: Payer: Self-pay

## 2020-01-31 DIAGNOSIS — I350 Nonrheumatic aortic (valve) stenosis: Secondary | ICD-10-CM | POA: Diagnosis not present

## 2020-01-31 MED ORDER — IOHEXOL 350 MG/ML SOLN
100.0000 mL | Freq: Once | INTRAVENOUS | Status: AC | PRN
  Administered 2020-01-31: 100 mL via INTRAVENOUS

## 2020-02-01 ENCOUNTER — Other Ambulatory Visit: Payer: Self-pay | Admitting: Physician Assistant

## 2020-02-01 ENCOUNTER — Telehealth: Payer: Self-pay | Admitting: Physician Assistant

## 2020-02-01 MED ORDER — CEFUROXIME AXETIL 500 MG PO TABS: 500.0000 mg | ORAL_TABLET | Freq: Two times a day (BID) | ORAL | 0 refills | Status: DC

## 2020-02-01 NOTE — Telephone Encounter (Signed)
°  [facility]   MULTIDISCIPLINARY HEART VALVE TEAM   Pt had pre TAVR CT scan yesterday which showed:  Interval development of what appears to be multilobar bronchopneumonia in the lungs bilaterally, most severe in the left upper lobe.  I have called and spoken to the pt who reports an ongoing cough but no fevers or chills. Also pt was recently admitted to hospital (discharge 12/17). I was initially going to call in Levaquin but given lupus on Plaqanil did not want to risk QT prolongation. D/w pharmacy who recommended Ceftin 500 mg BID x 10 days. This has been called into her pharmacy.   Cline Crock PA-C  [facility]

## 2020-02-08 ENCOUNTER — Telehealth: Payer: Self-pay | Admitting: Family Medicine

## 2020-02-08 DIAGNOSIS — Z7901 Long term (current) use of anticoagulants: Secondary | ICD-10-CM

## 2020-02-08 NOTE — Telephone Encounter (Signed)
Routing to our coumadin nurses

## 2020-02-08 NOTE — Telephone Encounter (Signed)
Patient is calling in needing a refill for her coumadin medication  Please send to the [facility] [address] [place] [facility]

## 2020-02-10 MED ORDER — WARFARIN SODIUM 7.5 MG PO TABS: ORAL_TABLET | ORAL | 0 refills | Status: DC

## 2020-02-10 NOTE — Addendum Note (Signed)
Addended by: Sherrie George on: 02/10/2020 10:43 AM   Modules accepted: Orders

## 2020-02-10 NOTE — Telephone Encounter (Signed)
Pt is compliant with coumadin management and sees Bailey Mech  RN for INR reading. Sent in script.

## 2020-02-14 ENCOUNTER — Encounter (HOSPITAL_COMMUNITY): Payer: Self-pay | Admitting: Cardiovascular Disease

## 2020-02-15 ENCOUNTER — Encounter: Payer: Self-pay | Admitting: Surgery

## 2020-02-15 ENCOUNTER — Institutional Professional Consult (permissible substitution) (INDEPENDENT_AMBULATORY_CARE_PROVIDER_SITE_OTHER): Payer: 59 | Admitting: Surgery

## 2020-02-15 ENCOUNTER — Other Ambulatory Visit: Payer: Self-pay

## 2020-02-15 VITALS — BP 106/68 | HR 81 | Resp 20 | Ht 65.0 in | Wt 313.0 lb

## 2020-02-15 DIAGNOSIS — I350 Nonrheumatic aortic (valve) stenosis: Secondary | ICD-10-CM | POA: Diagnosis not present

## 2020-02-15 NOTE — Progress Notes (Signed)
Patient ID: Cheryl DenverLori A Price, female   DOB: 05/23/1965, 55 y.o.   MRN: [id number]  [facility]  MULTIDISCIPLINARY HEART VALVE CLINIC  CARDIOTHORACIC SURGERY CONSULTATION REPORT  Referring Provider is Pricilla Riffleoss, Paula V, MD Primary Cardiologist is Dietrich PatesPaula Ross, MD PCP is Tower, Audrie GallusMarne A, MD  Chief Complaint  Patient presents with  . Consult    Initial surgical consult, TAVR vs. 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 Maren BeachVantrigt, OSA, and moderate aortic stenosis echocardiogram on 11/22/2018 had shown a mean gradient across aortic valve of 33 mmHg with a peak gradient of 54 mmHg.  Dimensionless index was 0.26.  Left ventricular ejection fraction was 60 to 65%.  She was admitted on 01/17/2020 after presenting with left-sided chest discomfort after awakening in the morning she described this as a twinge of pain that began in the chest and radiated to the left arm and shoulder lasting a couple minutes and then resolving and coming back it was not associated with exertion but was repetitive.  She ruled out for MI.  A repeat 2D echo on 01/17/2020 showed an increase in the mean gradient to 46 mmHg with a peak gradient of 72 mmHg.  Dimensionless index was 0.26 with a valve area of 0.9 cm.  Left ventricular ejection fraction was 65 to 70%.  She subsequently underwent cardiac catheterization on 01/19/2020 showing a peak to peak gradient across aortic valve of 58 mmHg with an LVEDP of 20 and a mean pulmonary wedge pressure of 18.  PA pressure was 42/14 with a mean of 30.  There is no significant coronary disease.  She says that since she went home she  has had no further chest discomfort.  She continues to have exertional fatigue.  She reports exertional shortness of breath but it is not always present.  She has occasional episodes of dizziness.  She has never had syncope.  She denies peripheral edema.  She has had no orthopnea  Past Medical History:  Diagnosis Date  . Arthritis    Lupus - hands/knees  . 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 GERD (gastroesophageal reflux disease)    on prilosec r/t gastric sleeve surgery  . HLD (hyperlipidemia)   . Iron deficiency anemia    IV dextran [place]  . Nephritis and nephropathy, not specified as acute or chronic, with unspecified pathological lesion in kidney   . Nonspecific abnormal results of liver function study   . Obesity, unspecified   . Other specified acquired hypothyroidism   . Panic disorder without agoraphobia   . Personal history of venous thrombosis and embolism 2002   during pregnancy; ?factor 5 leiden (sees heme)  . Pleural effusion 2005   c/w lupus initial w/u; recurrent Right as pred tapered off July 2011  . Sleep apnea   . 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 Unspecified essential hypertension     Past Surgical History:  Procedure Laterality Date  . BIOPSY  04/12/2019  Procedure: BIOPSY;  Surgeon: Tressia Danas, MD;  Location: [facility];  Service: Gastroenterology;;  . ESOPHAGEAL MANOMETRY N/A 07/15/2019   Procedure: ESOPHAGEAL MANOMETRY (EM);  Surgeon: Napoleon Form, MD;  Location: [facility];  Service: Endoscopy;  Laterality: N/A;  . ESOPHAGOGASTRODUODENOSCOPY (EGD) WITH PROPOFOL N/A 04/12/2019   Procedure: ESOPHAGOGASTRODUODENOSCOPY (EGD) WITH PROPOFOL;  Surgeon: Tressia Danas, MD;  Location: [facility];  Service: Gastroenterology;  Laterality: N/A;  . gastric sleeve  8/12   bariatric surgery  Dr Adolphus Birchwood   . LAPAROSCOPIC TUBAL LIGATION  12/09/2010   Procedure: LAPAROSCOPIC TUBAL LIGATION;  Surgeon: Juluis Mire;  Location: [facility];  Service: Gynecology;  Laterality: Bilateral;  Attempted see Nursing note  . pleurocentesis  10/2004  . Pleuryx catheter placement  9/11    VanTright----removed 9/11  . RENAL BIOPSY  09/24/2012  . RIGHT/LEFT HEART CATH AND CORONARY ANGIOGRAPHY N/A 01/19/2020   Procedure: RIGHT/LEFT HEART CATH AND CORONARY ANGIOGRAPHY;  Surgeon: Runell Gess, MD;  Location: [facility];  Service: Cardiovascular;  Laterality: N/A;  . svd     x 3  . THORACENTESIS  2010   with penumonia  . WISDOM TOOTH EXTRACTION      Family History  Problem Relation Age of Onset  . Lupus Sister   . Hypertension Father   . Hyperlipidemia Father   . Leukemia Sister   . Diabetes Other        GM  . Colon cancer Neg Hx   . Esophageal cancer Neg Hx   . Pancreatic cancer Neg Hx     Social History   Socioeconomic History  . Marital status: Divorced    Spouse name: Not on file  . Number of children: 3  . Years of education: Not on file  . Highest education level: Not on file  Occupational History  . Occupation: Psychologist, educational at [employer]  . Smoking status: Former Smoker    Packs/day: 0.50    Years: 10.00    Pack years: 5.00    Types: Cigarettes    Quit date: 05/04/2009    Years since quitting: 10.7  . Smokeless tobacco: Never Used  . Tobacco comment: Socially x10 years (1 pack/month)  Vaping Use  . Vaping Use: Never used  Substance and Sexual Activity  . Alcohol use: Yes    Alcohol/week: 0.0 standard drinks    Comment: rare  . Drug use: No  . Sexual activity: Not on file  Other Topics Concern  . Not on file  Social History Narrative  . Not on file   Social Determinants of Health   Financial Resource Strain: Not on file  Food Insecurity: Not on file  Transportation Needs: Not on file  Physical Activity: Not on file  Stress: Not on file   Social Connections: Not on file  Intimate Partner Violence: Not on file    Current Outpatient Medications  Medication Sig Dispense Refill  . bismuth subsalicylate (PEPTO BISMOL) 262 MG/15ML suspension Take 30 mLs by mouth every 6 (six) hours as needed for indigestion or diarrhea or loose stools.    . calcium carbonate (TUMS - DOSED IN MG ELEMENTAL CALCIUM) 500 MG chewable tablet Chew 1-2 tablets by mouth 3 (three) times daily as needed for indigestion or heartburn.    . cyanocobalamin (,VITAMIN B-12,) 1000 MCG/ML injection Inject 1,000 mcg into the muscle every 30 (thirty) days.     Marland Kitchen FERREX 150 150 MG capsule TAKE 1 CAPSULE BY MOUTH EVERY DAY (Patient  taking differently: Take 150 mg by mouth daily.) 90 capsule 0  . hydroxychloroquine (PLAQUENIL) 200 MG tablet Take 200 mg by mouth 2 (two) times daily.    Marland Kitchen labetalol (NORMODYNE) 200 MG tablet TAKE 1 AND 1/2 TABLETS BY MOUTH TWICE A DAY 270 tablet 3  . levothyroxine (SYNTHROID) 200 MCG tablet TAKE 1 TABLET (200 MCG TOTAL) BY MOUTH DAILY BEFORE BREAKFAST. 90 tablet 1  . Multiple Vitamins-Minerals (BARIATRIC MULTIVITAMINS/IRON) CAPS Take 1 tablet by mouth daily.    . nitroGLYCERIN (NITROSTAT) 0.4 MG SL tablet Take 1 tablet sublingual 10-15 minutes before meals, three times daily 50 tablet 3  . omeprazole (PRILOSEC) 20 MG capsule TAKE 1 CAPSULE (20 MG TOTAL) BY MOUTH 2 (TWO) TIMES DAILY BEFORE A MEAL. 180 capsule 1  . PARoxetine (PAXIL) 40 MG tablet TAKE 1 TABLET BY MOUTH EVERY DAY IN THE MORNING 90 tablet 3  . rosuvastatin (CRESTOR) 20 MG tablet TAKE 1 TABLET BY MOUTH EVERY DAY 90 tablet 3  . warfarin (COUMADIN) 7.5 MG tablet TAKE BY MOUTH 11.25 MG (1.5 TABLETS) MON, WED, FRI, SAT AND TAKE 15MG  (2 TABLETS) SUN, TUES, THURS OR AS INSTRUCTED BY COUMADIN CLINIC 145 tablet 0  . cefUROXime (CEFTIN) 500 MG tablet Take 1 tablet (500 mg total) by mouth 2 (two) times daily with a meal. 20 tablet 0   No current facility-administered medications for this  visit.    Allergies  Allergen Reactions  . Lisinopril Cough  . Lovenox [Enoxaparin Sodium] Itching  . Moxifloxacin Other (See Comments)    REACTION: hallucinations  . Lovenox [Enoxaparin]     Itching       Review of Systems:   General:  normal appetite, + decreased energy, no weight gain, no weight loss, no fever  Cardiac:  nochest pain with exertion, + chest pain at rest, +SOB with moderate exertion, no resting SOB, no PND, no orthopnea, no palpitations, no arrhythmia, no atrial fibrillation, no LE edema, + dizzy spells, no syncope  Respiratory:  + exertional shortness of breath, no home oxygen, no productive cough, no dry cough, no bronchitis, no wheezing, no hemoptysis, no asthma, no pain with inspiration or cough, + sleep apnea, no CPAP at night  GI:   no difficulty swallowing, + reflux, + frequent heartburn, no hiatal hernia, no abdominal pain, no constipation, no diarrhea, no hematochezia, no hematemesis, no melena  GU:   no dysuria,  no frequency, no urinary tract infection, no hematuria, no kidney stones, +lupus kidney disease  Vascular:  no pain suggestive of claudication, no pain in feet, + leg cramps, no varicose veins, + DVT, no non-healing foot ulcer  Neuro:   no stroke, no TIA's, no seizures, + headaches, no temporary blindness one eye,  no slurred speech, no peripheral neuropathy, + chronic pain, no instability of gait, no memory/cognitive dysfunction  Musculoskeletal: + arthritis,  joint swelling, no myalgias, no difficulty walking, normal mobility   Skin:   no rash, + itching, no skin infections, no pressure sores or ulcerations  Psych:   no anxiety, no depression, no nervousness, no unusual recent stress  Eyes:   no blurry vision, no floaters, no recent vision changes, + wears glasses or contacts  ENT:   no hearing loss, no loose or painful teeth, no dentures, has implants, last saw dentist Dec 25, 2019.  Hematologic:  no easy bruising, no abnormal bleeding, + clotting  disorder, no frequent epistaxis  Endocrine:  no diabetes, does not check CBG's at home  Physical Exam:   BP 106/68 (BP Location: Left Arm, Patient Position: Sitting)   Pulse 81   Resp 20   Ht 5\' 5"  (1.651 m)   Wt (!) 313 lb (142 kg)   LMP 11/08/2010   SpO2 91% Comment: RA with mask on  BMI 52.09 kg/m   General:  well-appearing  HEENT:  Unremarkable, NCAT, PERLA, EOMI  Neck:   no JVD, no bruits, no adenopathy   Chest:   clear to auscultation, symmetrical breath sounds, no wheezes, no rhonchi   CV:   RRR, grade lll/VI crescendo/decrescendo murmur heard best at RSB,  no diastolic murmur  Abdomen:  soft, non-tender, no masses   Extremities:  warm, well-perfused, pulses palpable at ankle, mild LE edema  Rectal/GU  Deferred  Neuro:   Grossly non-focal and symmetrical throughout  Skin:   Clean and dry, no rashes, no breakdown   Diagnostic Tests:  ECHOCARDIOGRAM LIMITED REPORT       Patient Name:  [name] Date of Exam: 01/17/2020  Medical Rec #: 01/19/2020   Height:  Accession #:  [id number]   Weight:  Date of Birth: [birth year]    BSA:  Patient Age:  54 years    BP:      131/74 mmHg  Patient Gender: F       HR:      79 bpm.  Exam Location: Inpatient   Procedure: Limited Echo, Limited Color Doppler and Cardiac Doppler   STAT ECHO   Indications:  chest pain    History:    Patient has prior history of Echocardiogram examinations,  most         recent 11/22/2018. Chronic kidney disease. lupus.,         Signs/Symptoms:Murmur; Risk Factors:Dyslipidemia.    Sonographer:  11/24/2018  Referring Phys: [id number] JENNIFER YATES   IMPRESSIONS    1. Left ventricular ejection fraction, by estimation, is 65 to 70%. The  left ventricle has normal function. The left ventricle has no regional  wall motion abnormalities. There is mild-to-moderate concentric left  ventricular hypertrophy. Left ventricular   diastolic parameters are consistent with Grade II diastolic dysfunction  (pseudonormalization).  2. Right ventricular systolic function is normal. The right ventricular  size is normal.  3. A small pericardial effusion appears to be present around the RA  although visualization is limited.  4. The mitral valve is normal in structure. Trivial mitral valve  regurgitation.  5. The aortic valve is tricuspid. There is moderate calcification of the  aortic valve. There is moderate thickening of the aortic valve. There is  severe aortic valve stenosis with a mean gradient of 2573, peak gradient  71.20mmHg, peak velocity 4.2 m/s,  AVA 0.9cm2 by continuity and DOI 0.26  6. The inferior vena cava is normal in size with greater than 50%  respiratory variability, suggesting right atrial pressure of 3 mmHg.   Comparison(s): Compared to prior echo on 11/22/2018, the aortic stenosis  is now severe.   FINDINGS  Left Ventricle: Left ventricular ejection fraction, by estimation, is 65  to 70%. The left ventricle has normal function. The left ventricle has no  regional wall motion abnormalities. The left ventricular internal cavity  size was normal in size. There is  mild-to-moderate concentric left ventricular hypertrophy. Left  ventricular diastolic parameters are consistent with Grade II diastolic  dysfunction (pseudonormalization).   Right Ventricle: The right ventricular size is normal. Right ventricular  systolic function is normal.   Left Atrium: Left  atrial size was normal in size.   Right Atrium: Right atrial size was normal in size.   Pericardium: A small pericardial effusion appears to be present around the  RA although visualization is limited.   Mitral Valve: The mitral valve is normal in structure. There is mild  thickening of the mitral valve leaflet(s). There is mild calcification of  the mitral valve leaflet(s). Mild mitral annular calcification. Trivial  mitral  valve regurgitation.   Tricuspid Valve: The tricuspid valve is normal in structure. Tricuspid  valve regurgitation is trivial.   Aortic Valve: The aortic valve is tricuspid. There is moderate  calcification of the aortic valve. There is moderate thickening of the  aortic valve. Severe aortic stenosis is present. Aortic valve mean  gradient measures 46.0 mmHg. Aortic valve peak  gradient measures 71.6 mmHg. Aortic valve area, by VTI measures 0.90 cm.  DOI 0.26   Pulmonic Valve: The pulmonic valve was normal in structure. Pulmonic valve  regurgitation is not visualized.   Aorta: The aortic root and ascending aorta are structurally normal, with  no evidence of dilitation.   Venous: The inferior vena cava is normal in size with greater than 50%  respiratory variability, suggesting right atrial pressure of 3 mmHg.   IAS/Shunts: No atrial level shunt detected by color flow Doppler.   LEFT VENTRICLE  PLAX 2D  LVIDd:     4.80 cm  LVIDs:     2.90 cm  LV PW:     1.20 cm  LV IVS:    1.10 cm  LVOT diam:   2.10 cm  LV SV:     83  LVOT Area:   3.46 cm     AORTIC VALVE  AV Area (Vmax):  0.79 cm  AV Area (VTI):   0.90 cm  AV Vmax:      423.00 cm/s  AV VTI:      0.923 m  AV Peak Grad:   71.6 mmHg  AV Mean Grad:   46.0 mmHg  LVOT Vmax:     96.90 cm/s  LVOT VTI:     0.241 m  LVOT/AV VTI ratio: 0.26    AORTA  Ao Root diam: 3.10 cm  Ao Asc diam: 3.30 cm   MV E velocity: 112.00 cm/s  MV A velocity: 127.00 cm/s SHUNTS  MV E/A ratio: 0.88     Systemic VTI: 0.24 m               Systemic Diam: 2.10 cm   Laurance Flatten MD  Electronically signed by Laurance Flatten MD  Signature Date/Time: 01/17/2020/6:08:26 PM      Final   Physicians  Panel Physicians Referring Physician Case Authorizing Physician  Runell Gess, MD (Primary)      Procedures  RIGHT/LEFT HEART CATH AND CORONARY  ANGIOGRAPHY   Conclusion    Hemodynamic findings consistent with aortic valve stenosis.   THALYA FOUCHE is a 55 y.o. female    [id number] LOCATION:  FACILITY: [facility]  PHYSICIAN: Nanetta Batty, M.D. 31-Jul-1965   DATE OF PROCEDURE:  02/14/2020  DATE OF DISCHARGE:     CARDIAC CATHETERIZATION     History obtained from chart review.Bentli A Parrottis a 55 y.o.femalewith a historyof moderate aortic stenosis on Echo in 11/2018, recurrent pleural effusion with superinfection on left s/p VATS and surgical pleurodesis in 2010, Factor V Leiden with prior PE/DVT on Coumadin, intracerebral hemorrhage, lupus with nephritis, hypertension, hyperlipidemia, GERD, hypothyroidism,and morbid obesity s/p gastric sleeve in 2012who is being  seen today for the evaluation ofaortic stenosisat the request of Dr. Benjamine Mola.  She presents today for right left heart cath to define her anatomy and physiology.   PROCEDURE DESCRIPTION:   The patient was brought to the second floor [facility] cath lab in the postabsorptive state.  She was premedicated .  Her right wrist and antecubital fossa Were prepped and shaved in usual sterile fashion. Xylocaine 1% was used for local anesthesia. A 6 French sheath was inserted into the right radial  artery using standard Seldinger technique.  A 5 French sheath was inserted into the right antecubital vein.  A 5 French balloontipped Swan-Ganz catheter was advanced through the right heart chambers obtaining sequential pressures and blood samples for the determination of Fick cardiac output.  S the patient received  4000 units  of heparin intravenously.  5 [place] TIG catheter right Judkins catheters were used for selective coronary angiography and obtaining left heart pressures.  Isovue dye was used for the entirety of the case.  Retrograde aorta, left ventricular and pullback pressures were recorded.  Radial cocktail was administered via the SideArm  sheath.   IMPRESSION: Ms. Sassi has severe AS with normal coronary arteries.  She is being evaluated by Dr. Laneta Simmers for aortic valve replacement.  Nanetta Batty. MD, [facility] 02/14/2020 4:38 PM       Indications  Aortic stenosis, severe [I35.0 (ICD-10-CM)]  Chest pain of uncertain etiology [R07.9 (ICD-10-CM)]   Procedural Details  Technical Details Estimated blood loss <50 mL.   During this procedure medications were administered to achieve and maintain moderate conscious sedation while the patient's heart rate, blood pressure, and oxygen saturation were continuously monitored and I was present face-to-face 100% of this time.   Medications (Filter: Administrations occurring from 1020 to 1113 on 01/19/20) (important) Continuous medications are totaled by the amount administered until 01/19/20 1113.    midazolam (VERSED) injection (mg) Total dose:  1 mg  Date/Time Rate/Dose/Volume Action   01/19/20 1034 1 mg Given    fentaNYL (SUBLIMAZE) injection (mcg) Total dose:  25 mcg  Date/Time Rate/Dose/Volume Action   01/19/20 1035 25 mcg Given    lidocaine (PF) (XYLOCAINE) 1 % injection (mL) Total volume:  4 mL  Date/Time Rate/Dose/Volume Action   01/19/20 1041 2 mL Canceled Entry   1041 2 mL Given   1049 2 mL Given    Radial Cocktail (Verapamil 5 mg, NTG, Lidocaine) Total dose:  Cannot be calculated*  *Administration dose not documented Date/Time Rate/Dose/Volume Action   01/19/20 1053  Given    heparin sodium (porcine) injection (Units) Total dose:  7,000 Units  Date/Time Rate/Dose/Volume Action   01/19/20 1054 7,000 Units Given    iohexol (OMNIPAQUE) 350 MG/ML injection (mL) Total volume:  50 mL  Date/Time Rate/Dose/Volume Action   01/19/20 1108 50 mL Given    Heparin (Porcine) in NaCl 1000-0.9 UT/500ML-% SOLN (mL) Total volume:  1,000 mL  Date/Time Rate/Dose/Volume Action   01/19/20 1104 500 mL Given   1104 500 mL Given    bisacodyl  (DULCOLAX) EC tablet 5 mg (mg) Total dose:  Cannot be calculated*  *Administration dose not documented Date/Time Rate/Dose/Volume Action   01/19/20 1026 *Not included in total MAR Hold    calcium carbonate (TUMS - dosed in mg elemental calcium) chewable tablet 200 mg of elemental calcium (mg of elemental calcium) Total dose:  Cannot be calculated* Dosing weight:  142.7  *Administration dose not documented Date/Time Rate/Dose/Volume Action   01/19/20 1026 *Not included in  total MAR Hold    docusate sodium (COLACE) capsule 100 mg (mg) Total dose:  Cannot be calculated*  *Administration dose not documented Date/Time Rate/Dose/Volume Action   01/19/20 1026 *Not included in total MAR Hold    hydrALAZINE (APRESOLINE) injection 5 mg (mg) Total dose:  Cannot be calculated*  *Administration dose not documented Date/Time Rate/Dose/Volume Action   01/19/20 1026 *Not included in total MAR Hold    HYDROcodone-acetaminophen (NORCO/VICODIN) 5-325 MG per tablet 1-2 tablet (tablet) Total dose:  Cannot be calculated*  *Administration dose not documented Date/Time Rate/Dose/Volume Action   01/19/20 1026 *Not included in total MAR Hold    hydroxychloroquine (PLAQUENIL) tablet 200 mg (mg) Total dose:  Cannot be calculated*  *Administration dose not documented Date/Time Rate/Dose/Volume Action   01/19/20 1026 *Not included in total MAR Hold    labetalol (NORMODYNE) tablet 300 mg (mg) Total dose:  Cannot be calculated*  *Administration dose not documented Date/Time Rate/Dose/Volume Action   01/19/20 1026 *Not included in total MAR Hold    levothyroxine (SYNTHROID) tablet 200 mcg (mcg) Total dose:  Cannot be calculated*  *Administration dose not documented Date/Time Rate/Dose/Volume Action   01/19/20 1026 *Not included in total MAR Hold    morphine 2 MG/ML injection 2 mg (mg) Total dose:  Cannot be calculated*  *Administration dose not documented Date/Time Rate/Dose/Volume Action    01/19/20 1026 *Not included in total MAR Hold    multivitamin with minerals tablet 1 tablet (tablet) Total dose:  Cannot be calculated* Dosing weight:  142.7  *Administration dose not documented Date/Time Rate/Dose/Volume Action   01/19/20 1026 *Not included in total MAR Hold    pantoprazole (PROTONIX) EC tablet 40 mg (mg) Total dose:  Cannot be calculated*  *Administration dose not documented Date/Time Rate/Dose/Volume Action   01/19/20 1026 *Not included in total MAR Hold    PARoxetine (PAXIL) tablet 40 mg (mg) Total dose:  Cannot be calculated*  *Administration dose not documented Date/Time Rate/Dose/Volume Action   01/19/20 1026 *Not included in total MAR Hold    polyethylene glycol (MIRALAX / GLYCOLAX) packet 17 g (g) Total dose:  Cannot be calculated*  *Administration dose not documented Date/Time Rate/Dose/Volume Action   01/19/20 1026 *Not included in total MAR Hold    protein supplement (ENSURE MAX) liquid (oz) Total dose:  Cannot be calculated* Dosing weight:  142.7  *Administration dose not documented Date/Time Rate/Dose/Volume Action   01/19/20 1026 *Not included in total MAR Hold    rosuvastatin (CRESTOR) tablet 20 mg (mg) Total dose:  Cannot be calculated*  *Administration dose not documented Date/Time Rate/Dose/Volume Action   01/19/20 1026 *Not included in total MAR Hold    sodium chloride flush (NS) 0.9 % injection 3 mL (mL) Total dose:  Cannot be calculated*  *Administration dose not documented Date/Time Rate/Dose/Volume Action   01/19/20 1026 *Not included in total MAR Hold    Warfarin - Pharmacist Dosing Inpatient Total dose:  Cannot be calculated* Dosing weight:  136.1  *Administration dose not documented Date/Time Rate/Dose/Volume Action   01/19/20 1026 *Not included in total MAR Hold    Sedation Time  Sedation Time Physician-1: 28 minutes 27 seconds   Contrast  Medication Name Total Dose  iohexol (OMNIPAQUE) 350 MG/ML  injection 50 mL    Radiation/Fluoro  Fluoro time: 4.5 (min) DAP: 37943 (mGycm2) Cumulative Air Kerma: 505 (mGy)   Coronary Findings   Diagnostic Dominance: Right  No diagnostic findings have been documented.  Intervention   No interventions have been documented.  Right Heart  Right Heart Pressures Hemodynamic findings consistent with aortic valve stenosis. Right atrial pressure-11/11 Right ventricular pressure-43/7 Pulmonary artery pressure-42/14, mean 30 Pulmonary wedge pressure-A-wave 22, V wave 24, mean 18 LVEDP-20 Cardiac output-12.1 L/min with an index of 5 L/min/m by Fick Aortic valve gradient peak to peak 58 mmHg Aortic valve area 1.46 cm with an index of 0.61 cm/m   Coronary Diagrams   Diagnostic Dominance: Right    Intervention    Implants    No implant documentation for this case.   Syngo Images  Show images for CARDIAC CATHETERIZATION  Images on Long Term Storage  Show images for [place], [place] to Procedure Log  Procedure Log     Hemo Data  Flowsheet Row Most Recent Value  Fick Cardiac Output 12.1 L/min  Fick Cardiac Output Index 5.04 (L/min)/BSA  Aortic Mean Gradient 52.14 mmHg  Aortic Peak Gradient 58 mmHg  Aortic Valve Area 1.46  Aortic Value Area Index 0.61 cm2/BSA  RA A Wave 11 mmHg  RA V Wave 11 mmHg  RA Mean 8 mmHg  RV Systolic Pressure 43 mmHg  RV Diastolic Pressure 7 mmHg  RV EDP 10 mmHg  PA Systolic Pressure 42 mmHg  PA Diastolic Pressure 14 mmHg  PA Mean 30 mmHg  PW A Wave 22 mmHg  PW V Wave 24 mmHg  PW Mean 18 mmHg  AO Systolic Pressure 127 mmHg  AO Diastolic Pressure 84 mmHg  AO Mean 103 mmHg  LV Systolic Pressure 191 mmHg  LV Diastolic Pressure 6 mmHg  LV EDP 20 mmHg  AOp Systolic Pressure 131 mmHg  AOp Diastolic Pressure 81 mmHg  AOp Mean Pressure 104 mmHg  LVp Systolic Pressure 189 mmHg  LVp Diastolic Pressure 2 mmHg  LVp EDP Pressure 18 mmHg  QP/QS 1  TPVR Index 5.95 HRUI  TSVR  Index 20.44 HRUI  PVR SVR Ratio 0.13  TPVR/TSVR Ratio 0.29    ADDENDUM REPORT: 01/20/2020 14:32  CLINICAL DATA:  Severe Aortic Stenosis.  EXAM: Cardiac TAVR CT  TECHNIQUE: The patient was scanned on a [employer]. A 120 kV retrospective scan was triggered in the descending thoracic aorta at 111 HU's. Gantry rotation speed was 250 msecs and collimation was .6 mm. No beta blockade or nitro were given. The 3D data set was reconstructed in 5% intervals of the R-R cycle. Systolic and diastolic phases were analyzed on a dedicated work station using MPR, MIP and VRT modes. The patient received 80 cc of contrast.  FINDINGS: Image quality: Average.  Noise artifact is: Significant signal to noise artifact (BMI 52).  Valve Morphology: The aortic valve is tricuspid. There is severe bulky calcification of the RCC and this leaflet is immobile. There is moderate calcification of the NCC and LCC.  Aortic Valve Calcium score: 1789  Aortic annular dimension:  Phase assessed: 35%  Annular area: 486 mm2  Annular perimeter: 80.7 mm  Max diameter: 28.8 mm  Min diameter: 22.0 mm  Annular and subannular calcification: None.  Optimal coplanar projection: LAO 17 CAU 1  Coronary Artery Height above Annulus:  Left Main: 16.3 mm  Right Coronary: 20.2 mm  Sinus of Valsalva Measurements:  Non-coronary: 30 mm  Right-coronary: 28 mm  Left-coronary: 30 mm  Sinus of Valsalva Height:  Non-coronary: 21.1 mm  Right-coronary: 25.3 mm  Left-coronary: 22.1 mm  Sinotubular Junction: 29 mm  Ascending Thoracic Aorta: 32 mm  Coronary Arteries: Normal coronary origin. Right dominance. The study was performed without use of NTG and  is insufficient for plaque evaluation. Please refer to recent cardiac catheterization for coronary assessment.  Cardiac Morphology:  Right Atrium: Right atrial size is within normal limits.  Right Ventricle:  The right ventricular cavity is within normal limits.  Left Atrium: Left atrial size is normal in size with no left atrial appendage filling defect.  Left Ventricle: The ventricular cavity size is within normal limits. There are no stigmata of prior infarction. There is no abnormal filling defect. Normal left ventricular function, 73%. No regional wall motion abnormalities.  Pulmonary arteries: Normal in size without proximal filling defect.  Pulmonary veins: Normal pulmonary venous drainage.  Pericardium: Normal thickness with no significant effusion or calcium present.  Mitral Valve: The mitral valve is normal structure with mild mitral annular calcification.  Extra-cardiac findings: See attached radiology report for non-cardiac structures.  IMPRESSION: 1. Difficult study due to obesity artifact (BMI 52).  2. Tricuspid aortic valve with severe aortic stenosis (aortic valve calcium score 1789).  3. Annular measurements appropriate for 26 mm Edwards S3 TAVR (486 mm2).  4. No significant annular or subannular calcifications.  5. Sufficient coronary to annulus distance.  6. Optimal Fluoroscopic Angle for Delivery:  LAO 17 CAU 1  Alderson T. Flora Lipps, MD   Electronically Signed   By: Lennie Odor   On: 01/20/2020 14:32  Impression:  This 55 year old woman has stage D, severe, symptomatic aortic stenosis with New York Heart Association class II symptoms of exertional fatigue and shortness of breath consistent with chronic diastolic congestive heart failure who recently presented with recurrent left chest discomfort.  I have personally reviewed her 2D echocardiogram, cardiac catheterization, and CTA studies.  Her echocardiogram shows a trileaflet aortic valve with heavy calcification and restricted mobility.  The mean gradient is 46 mmHg consistent with severe aortic stenosis.  Left ventricular ejection fraction is normal.  Cardiac catheterization confirmed  severe aortic stenosis and coronary angiography shows no coronary disease.  I agree that aortic valve replacement is indicated in this patient for relief of her symptoms and to prevent progressive left ventricular deterioration.  Given her young age I think open surgical aortic valve replacement is the best option for her.  I did discuss transcatheter aortic valve replacement with her and my recommendation not to perform that due to her young age and the high risk of structural valve deterioration.  She does have multiple comorbid risk factors including morbid obesity but is still active and I think she will make a good recovery following aortic valve replacement.  I have recommended that we use a bioprosthetic valve to avoid the need for lifelong anticoagulation with Coumadin.  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  She is in agreement with that. I discussed the operative procedure with the patient including alternatives, benefits and risks; including but not limited to bleeding, blood transfusion, infection, stroke, myocardial infarction, graft failure, heart block requiring a permanent pacemaker, organ dysfunction, and death.  [name] understands and agrees to proceed.  She would like to think about this further and discuss it with her mother to make arrangements for postoperative care.  I discussed the possibility of using a Lovenox bridge while we stop her Coumadin for 5 days preoperatively.  She said that she has had significant recurrent rash with Lovenox and is now listed as an allergy.  I think it should be fine to stop her Coumadin 5 days preoperatively.   Plan:  She will call our office to schedule open surgical aortic valve replacement using a bioprosthetic valve.  She will have Coumadin stopped 5 days  preoperatively.      Alleen Borne, MD 02/15/2020

## 2020-02-16 ENCOUNTER — Ambulatory Visit: Payer: 59

## 2020-02-28 ENCOUNTER — Other Ambulatory Visit (INDEPENDENT_AMBULATORY_CARE_PROVIDER_SITE_OTHER): Payer: 59 | Admitting: General Practice

## 2020-02-28 ENCOUNTER — Other Ambulatory Visit: Payer: Self-pay

## 2020-02-28 ENCOUNTER — Ambulatory Visit (INDEPENDENT_AMBULATORY_CARE_PROVIDER_SITE_OTHER): Payer: 59 | Admitting: General Practice

## 2020-02-28 DIAGNOSIS — D6851 Activated protein C resistance: Secondary | ICD-10-CM

## 2020-02-28 DIAGNOSIS — Z7901 Long term (current) use of anticoagulants: Secondary | ICD-10-CM

## 2020-02-28 DIAGNOSIS — E538 Deficiency of other specified B group vitamins: Secondary | ICD-10-CM

## 2020-02-28 DIAGNOSIS — Z86711 Personal history of pulmonary embolism: Secondary | ICD-10-CM | POA: Diagnosis not present

## 2020-02-28 LAB — POCT INR: INR: 2.5 (ref 2.0–3.0)

## 2020-02-28 MED ORDER — CYANOCOBALAMIN 1000 MCG/ML IJ SOLN
1000.0000 ug | Freq: Once | INTRAMUSCULAR | Status: AC
  Administered 2020-02-28: 1000 ug via INTRAMUSCULAR

## 2020-02-28 NOTE — Patient Instructions (Addendum)
Pre visit review using our clinic review tool, if applicable. No additional management support is needed unless otherwise documented below in the visit note. Continue to  take  1 1/2 tablets daily except 2 tablets on  Sundays Tuesdays and Thursdays.  Re-check in 4 weeks.  Give B-12 as well.

## 2020-02-28 NOTE — Progress Notes (Signed)
Medical screening examination/treatment/procedure(s) were performed by non-physician practitioner and as supervising physician I was immediately available for consultation/collaboration. I agree with above. Ryon Layton, MD   

## 2020-02-29 ENCOUNTER — Encounter: Payer: 59 | Admitting: Surgery

## 2020-03-01 ENCOUNTER — Ambulatory Visit (INDEPENDENT_AMBULATORY_CARE_PROVIDER_SITE_OTHER): Payer: 59 | Admitting: Physician Assistant

## 2020-03-01 ENCOUNTER — Other Ambulatory Visit: Payer: Self-pay | Admitting: *Deleted

## 2020-03-01 ENCOUNTER — Other Ambulatory Visit: Payer: Self-pay

## 2020-03-01 ENCOUNTER — Encounter: Payer: Self-pay | Admitting: Physician Assistant

## 2020-03-01 VITALS — BP 120/82 | HR 81 | Ht 65.0 in | Wt 316.2 lb

## 2020-03-01 DIAGNOSIS — E7800 Pure hypercholesterolemia, unspecified: Secondary | ICD-10-CM | POA: Diagnosis not present

## 2020-03-01 DIAGNOSIS — I350 Nonrheumatic aortic (valve) stenosis: Secondary | ICD-10-CM

## 2020-03-01 DIAGNOSIS — Z7901 Long term (current) use of anticoagulants: Secondary | ICD-10-CM

## 2020-03-01 DIAGNOSIS — D6851 Activated protein C resistance: Secondary | ICD-10-CM | POA: Diagnosis not present

## 2020-03-01 NOTE — Patient Instructions (Addendum)
Medication Instructions:  Your physician recommends that you continue on your current medications as directed. Please refer to the Current Medication list given to you today.  *If you need a refill on your cardiac medications before your next appointment, please call your pharmacy*   Lab Work: None ordered  If you have labs (blood work) drawn today and your tests are completely normal, you will receive your results only by: Marland Kitchen MyChart Message (if you have MyChart) OR . A paper copy in the mail If you have any lab test that is abnormal or we need to change your treatment, we will call you to review the results.   Testing/Procedures: None ordered   Follow-Up: At [facility], you and your health needs are our priority.  As part of our continuing mission to provide you with exceptional heart care, we have created designated Provider Care Teams.  These Care Teams include your primary Cardiologist (physician) and Advanced Practice Providers (APPs -  Physician Assistants and Nurse Practitioners) who all work together to provide you with the care you need, when you need it.  We recommend signing up for the patient portal called "MyChart".  Sign up information is provided on this After Visit Summary.  MyChart is used to connect with patients for Virtual Visits (Telemedicine).  Patients are able to view lab/test results, encounter notes, upcoming appointments, etc.  Non-urgent messages can be sent to your provider as well.   To learn more about what you can do with MyChart, go to [email or web address].    Your next appointment:   4 month(s)  The format for your next appointment:   In Person  Provider:   You may see Dietrich Pates, MD or one of the following Advanced Practice Providers on your designated Care Team:    Tereso Newcomer, PA-C  Chelsea Aus, [place]    Other Instructions

## 2020-03-01 NOTE — Progress Notes (Signed)
Cardiology Office Note:    Date:  03/01/2020   ID:  [name], DOB 11-04-1965, MRN [id number]  PCP:  Judy Pimple, MD  [facility] Cardiologist:  Dietrich Pates, MD  [facility] Electrophysiologist:  None   Chief Complaint: Hospital follow up   History of Present Illness:    [name] is a 55 y.o. 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 Abel Presto) HLD, HLD, recurrent pleural effusion with superinfection on left s/p VATS. Morbid obesity and aortic stenosis presents for follow up.   Echo in 11/2018 showed LVEF of 60-65% with mild LVH and moderate aortic stenosis with mean gradient of 33.1 mmHg.  Presented 01/2020 for chest pain. Cath with normal coronaries.  Mean gradient 52.14 mmHg. Cardiac output 12.1 L/min with an index of 5 L/min/m2 by Fick. LVEDp 20. Mean pulmonary wedge pressure 18. Finding consistent with severe AS. She was discharged with plan for outpatient TVAR vs AVR work up. CT of chest 12/28 showed multilobar bronchopneumonia in the lungs bilaterally, most severe in the left upper lobe. Treated with Ceftin.   Patient presented for follow-up.  She is thinking about surgery.  Denies syncope, palpitation, orthopnea, PND, lower extremity edema or melena.  She has shortness of breath with minimal activity which is gradually worsening.  Past Medical History:  Diagnosis Date  . Arthritis    Lupus - hands/knees  . 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 GERD (gastroesophageal reflux disease)    on prilosec r/t gastric sleeve surgery  . HLD (hyperlipidemia)   . Iron deficiency anemia    IV dextran [place]  . Nephritis and nephropathy, not specified as acute or chronic, with unspecified pathological lesion in kidney   . Nonspecific abnormal results of liver function study   . Obesity, unspecified    . Other specified acquired hypothyroidism   . Panic disorder without agoraphobia   . Personal history of venous thrombosis and embolism 2002   during pregnancy; ?factor 5 leiden (sees heme)  . Pleural effusion 2005   c/w lupus initial w/u; recurrent Right as pred tapered off July 2011  . Sleep apnea   . 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 Unspecified essential hypertension     Past Surgical History:  Procedure Laterality Date  . BIOPSY  04/12/2019   Procedure: BIOPSY;  Surgeon: Tressia Danas, MD;  Location: [facility];  Service: Gastroenterology;;  . ESOPHAGEAL MANOMETRY N/A 07/15/2019   Procedure: ESOPHAGEAL MANOMETRY (EM);  Surgeon: Napoleon Form, MD;  Location: [facility];  Service: Endoscopy;  Laterality: N/A;  . ESOPHAGOGASTRODUODENOSCOPY (EGD) WITH PROPOFOL N/A 04/12/2019   Procedure: ESOPHAGOGASTRODUODENOSCOPY (EGD) WITH PROPOFOL;  Surgeon: Tressia Danas, MD;  Location: [facility];  Service: Gastroenterology;  Laterality: N/A;  . gastric sleeve  8/12   bariatric surgery Dr Adolphus Birchwood   . LAPAROSCOPIC TUBAL LIGATION  12/09/2010   Procedure: LAPAROSCOPIC TUBAL LIGATION;  Surgeon: Juluis Mire;  Location: [facility];  Service: Gynecology;  Laterality: Bilateral;  Attempted see Nursing note  . pleurocentesis  10/2004  . Pleuryx catheter placement  9/11    VanTright----removed 9/11  . RENAL BIOPSY  09/24/2012  . RIGHT/LEFT HEART CATH AND CORONARY ANGIOGRAPHY N/A 01/19/2020   Procedure: RIGHT/LEFT HEART CATH AND  CORONARY ANGIOGRAPHY;  Surgeon: Runell Gess, MD;  Location: [facility] INVASIVE CV LAB;  Service: Cardiovascular;  Laterality: N/A;  . svd     x 3  . THORACENTESIS  2010   with penumonia  . WISDOM TOOTH EXTRACTION      Current Medications: No outpatient medications have been marked as taking for the 03/01/20 encounter (Appointment) with Manson Passey, PA.     Allergies:   Lisinopril, Lovenox [enoxaparin  sodium], Moxifloxacin, and Lovenox [enoxaparin]   Social History   Socioeconomic History  . Marital status: Divorced    Spouse name: Not on file  . Number of children: 3  . Years of education: Not on file  . Highest education level: Not on file  Occupational History  . Occupation: Psychologist, educational at [employer]  . Smoking status: Former Smoker    Packs/day: 0.50    Years: 10.00    Pack years: 5.00    Types: Cigarettes    Quit date: 05/04/2009    Years since quitting: 10.8  . Smokeless tobacco: Never Used  . Tobacco comment: Socially x10 years (1 pack/month)  Vaping Use  . Vaping Use: Never used  Substance and Sexual Activity  . Alcohol use: Yes    Alcohol/week: 0.0 standard drinks    Comment: rare  . Drug use: No  . Sexual activity: Not on file  Other Topics Concern  . Not on file  Social History Narrative  . Not on file   Social Determinants of Health   Financial Resource Strain: Not on file  Food Insecurity: Not on file  Transportation Needs: Not on file  Physical Activity: Not on file  Stress: Not on file  Social Connections: Not on file     Family History: [name] family history includes Diabetes in an other family member; Hyperlipidemia in her father; Hypertension in her father; Leukemia in her sister; Lupus in her sister. There is no history of Colon cancer, Esophageal cancer, or Pancreatic cancer.   ROS:   Please see the history of present illness.    All other systems reviewed and are negative.  EKGs/Labs/Other Studies Reviewed:    The following studies were reviewed today:  Carotid doppler 01/2020 Summary:  Right Carotid: Velocities in the right ICA are consistent with a 1-39%  stenosis.   Left Carotid: Velocities in the left ICA are consistent with a 1-39%  stenosis.   Echo 01/17/20 1. Left ventricular ejection fraction, by estimation, is 65 to 70%. The  left ventricle has normal function. The left ventricle has no regional   wall motion abnormalities. There is mild-to-moderate concentric left  ventricular hypertrophy. Left ventricular  diastolic parameters are consistent with Grade II diastolic dysfunction  (pseudonormalization).  2. Right ventricular systolic function is normal. The right ventricular  size is normal.  3. A small pericardial effusion appears to be present around the RA  although visualization is limited.  4. The mitral valve is normal in structure. Trivial mitral valve  regurgitation.  5. The aortic valve is tricuspid. There is moderate calcification of the  aortic valve. There is moderate thickening of the aortic valve. There is  severe aortic valve stenosis with a mean gradient of , peak gradient  71.78mmHg, peak velocity 4.2 m/s,  AVA 0.9cm2 by continuity and DOI 0.26  6. The inferior vena cava is normal in size with greater than 50%  respiratory variability, suggesting right atrial pressure of 3 mmHg.   RIGHT/LEFT HEART CATH AND CORONARY  ANGIOGRAPHY 01/19/20 Right Heart Pressures Hemodynamic findings consistent with aortic valve stenosis. Right atrial pressure-11/11 Right ventricular pressure-43/7 Pulmonary artery pressure-42/14, mean 30 Pulmonary wedge pressure-A-wave 22, V wave 24, mean 18 LVEDP-20 Cardiac output-12.1 L/min with an index of 5 L/min/m by Fick Aortic valve gradient peak to peak 58 mmHg Aortic valve area 1.46 cm with an index of 0.61 cm/m    IMPRESSION: [name] has severe AS with normal coronary arteries.  She is being evaluated by Dr. Laneta Simmers for aortic valve replacement.   EKG:  EKG is not ordered today.  Recent Labs: 05/27/2019: ALT 18 01/18/2020: TSH 0.420 01/20/2020: BUN 10; Creatinine, Ser 1.00; Hemoglobin 10.0; Platelets 176; Potassium 4.0; Sodium 142  Recent Lipid Panel    Component Value Date/Time   CHOL 206 (H) 01/04/2019 1615   TRIG 124.0 01/04/2019 1615   HDL 49.50 01/04/2019 1615   CHOLHDL 4 01/04/2019 1615   VLDL 24.8  01/04/2019 1615   LDLCALC 131 (H) 01/04/2019 1615   LDLDIRECT 203.7 06/03/2011 1433    Physical Exam:    VS:  LMP 11/08/2010     Wt Readings from Last 3 Encounters:  02/15/20 (!) 313 lb (142 kg)  01/26/20 (!) 314 lb 6 oz (142.6 kg)  01/26/20 (!) 314 lb (142.4 kg)     GEN: Well nourished, well developed in no acute distress HEENT: Normal NECK: No JVD; No carotid bruits LYMPHATICS: No lymphadenopathy CARDIAC:RRR, 3/6 SE murmurs, rubs, gallops RESPIRATORY:  Clear to auscultation without rales, wheezing or rhonchi  ABDOMEN: Soft, non-tender, non-distended MUSCULOSKELETAL:  No edema; No deformity  SKIN: Warm and dry NEUROLOGIC:  Alert and oriented x 3 PSYCHIATRIC:  Normal affect   ASSESSMENT AND PLAN:    1. Severe Aortic stenosis - Seen by Dr. Laneta Simmers 02/15/20 who recommended bioprosthetic AVR.  Patient will call surgeon office today to get scheduled.  2. Ffactor V leiden with prior hx of  PE/ DVT - Coumadin per pharmacy    3. Chronic Left Pleural Effusion with history of Recurrent Pleural Effusions with Superinfection on Left s/p VATS and Surgical Pleurodesis in 2010 - PCCM did not felt need of intervention during admission 01/2020  4. HLD - Continue statin     Medication Adjustments/Labs and Tests Ordered: Current medicines are reviewed at length with the patient today.  Concerns regarding medicines are outlined above.  No orders of the defined types were placed in this encounter.  No orders of the defined types were placed in this encounter.   There are no Patient Instructions on file for this visit.   Lorelei Pont, [place]  03/01/2020 9:59 AM    [facility]

## 2020-03-01 NOTE — Progress Notes (Signed)
Please sign

## 2020-03-02 ENCOUNTER — Encounter: Payer: Self-pay | Admitting: *Deleted

## 2020-03-22 ENCOUNTER — Other Ambulatory Visit: Payer: Self-pay | Admitting: Family Medicine

## 2020-03-27 ENCOUNTER — Ambulatory Visit (INDEPENDENT_AMBULATORY_CARE_PROVIDER_SITE_OTHER): Payer: 59 | Admitting: General Practice

## 2020-03-27 ENCOUNTER — Other Ambulatory Visit: Payer: Self-pay

## 2020-03-27 DIAGNOSIS — D6851 Activated protein C resistance: Secondary | ICD-10-CM | POA: Diagnosis not present

## 2020-03-27 DIAGNOSIS — E538 Deficiency of other specified B group vitamins: Secondary | ICD-10-CM

## 2020-03-27 DIAGNOSIS — Z86711 Personal history of pulmonary embolism: Secondary | ICD-10-CM

## 2020-03-27 DIAGNOSIS — Z7901 Long term (current) use of anticoagulants: Secondary | ICD-10-CM

## 2020-03-27 LAB — POCT INR: INR: 2.4 (ref 2.0–3.0)

## 2020-03-27 MED ORDER — CYANOCOBALAMIN 1000 MCG/ML IJ SOLN
1000.0000 ug | Freq: Once | INTRAMUSCULAR | Status: AC
  Administered 2020-03-27: 1000 ug via INTRAMUSCULAR

## 2020-03-27 NOTE — Patient Instructions (Addendum)
Pre visit review using our clinic review tool, if applicable. No additional management support is needed unless otherwise documented below in the visit note.  Continue to  take  1 1/2 tablets daily except 2 tablets on  Sundays Tuesdays and Thursdays.  Re-check in 4 weeks.    Patient is having heart surgery on 3/4.  Cardiology has given patient instructions for holding warfarin prior to procedure.

## 2020-03-27 NOTE — Progress Notes (Signed)
Medical screening examination/treatment/procedure(s) were performed by non-physician practitioner and as supervising physician I was immediately available for consultation/collaboration. I agree with above. Makila Colombe, MD   

## 2020-04-04 ENCOUNTER — Inpatient Hospital Stay (HOSPITAL_COMMUNITY)
Admission: RE | Admit: 2020-04-04 | Discharge: 2020-04-04 | Disposition: A | Discharge status: Home / Self Care | Payer: 59 | Source: Ambulatory Visit

## 2020-04-04 ENCOUNTER — Ambulatory Visit (HOSPITAL_COMMUNITY)
Admission: RE | Admit: 2020-04-04 | Discharge: 2020-04-04 | Disposition: A | Discharge status: Home / Self Care | Payer: 59 | Source: Ambulatory Visit | Attending: Surgery | Admitting: Surgery

## 2020-04-04 ENCOUNTER — Other Ambulatory Visit: Payer: Self-pay

## 2020-04-04 ENCOUNTER — Other Ambulatory Visit (HOSPITAL_COMMUNITY)
Admission: RE | Admit: 2020-04-04 | Discharge: 2020-04-04 | Disposition: A | Discharge status: Home / Self Care | Payer: 59 | Source: Ambulatory Visit | Attending: Surgery | Admitting: Surgery

## 2020-04-04 DIAGNOSIS — I10 Essential (primary) hypertension: Secondary | ICD-10-CM | POA: Diagnosis not present

## 2020-04-04 DIAGNOSIS — I350 Nonrheumatic aortic (valve) stenosis: Secondary | ICD-10-CM | POA: Insufficient documentation

## 2020-04-04 DIAGNOSIS — Z20822 Contact with and (suspected) exposure to covid-19: Secondary | ICD-10-CM | POA: Insufficient documentation

## 2020-04-04 LAB — SARS CORONAVIRUS 2 (TAT 6-24 HRS): SARS Coronavirus 2: NEGATIVE

## 2020-04-04 NOTE — Progress Notes (Signed)
Pre-AVR testing has been completed. Preliminary results can be found in CV Proc through chart review.  The carotid artery duplex was completed on 01/20/2020.  04/04/20 10:22 AM Olen Cordial RVT

## 2020-04-04 NOTE — Progress Notes (Signed)
Surgical Instructions    Your procedure is scheduled on 04/06/20.  Report to [facility] Main Entrance "A" at 05:30 A.M., then check in with the Admitting office.  Call this number if you have problems the morning of surgery:  [phone]   If you have any questions prior to your surgery date call [phone]: Open Monday-Friday 8am-4pm    Remember:  Do not eat or drink after midnight the night before your surgery     Take these medicines the morning of surgery with A SIP OF WATER  labetalol (NORMODYNE)  levothyroxine (SYNTHROID) omeprazole (PRILOSEC)  PARoxetine (PAXIL) rosuvastatin (CRESTOR)  As of today, STOP taking any Aspirin (unless otherwise instructed by your surgeon) Aleve, Naproxen, Ibuprofen, Motrin, Advil, Goody's, BC's, all herbal medications, fish oil, and all vitamins. This includeshydroxychloroquine (PLAQUENIL).   Please stop warfarin (COUMADIN) 5 days prior to surgery. Last dose will be 03/31/20.                     Do not wear jewelry, make up, or nail polish            Do not wear lotions, powders, perfumes/colognes, or deodorant.            Do not shave 48 hours prior to surgery.              Do not bring valuables to the hospital.            [facility] is not responsible for any belongings or valuables.  Do NOT Smoke (Tobacco/Vaping) or drink Alcohol 24 hours prior to your procedure If you use a CPAP at night, you may bring all equipment for your overnight stay.   Contacts, glasses, dentures or bridgework may not be worn into surgery, please bring cases for these belongings   For patients admitted to the hospital, discharge time will be determined by your treatment team.   Patients discharged the day of surgery will not be allowed to drive home, and someone needs to stay with them for 24 hours.    Special instructions:   [place]- Preparing For Surgery  Before surgery, you can play an important role. Because skin is not sterile, your skin needs to  be as free of germs as possible. You can reduce the number of germs on your skin by washing with CHG (chlorahexidine gluconate) Soap before surgery.  CHG is an antiseptic cleaner which kills germs and bonds with the skin to continue killing germs even after washing.    Oral Hygiene is also important to reduce your risk of infection.  Remember - BRUSH YOUR TEETH THE MORNING OF SURGERY WITH YOUR REGULAR TOOTHPASTE  Please do not use if you have an allergy to CHG or antibacterial soaps. If your skin becomes reddened/irritated stop using the CHG.  Do not shave (including legs and underarms) for at least 48 hours prior to first CHG shower. It is OK to shave your face.  Please follow these instructions carefully.   1. Shower the NIGHT BEFORE SURGERY and the MORNING OF SURGERY  2. If you chose to wash your hair, wash your hair first as usual with your normal shampoo.  3. After you shampoo, rinse your hair and body thoroughly to remove the shampoo.  4. Wash Face and genitals (private parts) with your normal soap.   5.  Shower the NIGHT BEFORE SURGERY and the MORNING OF SURGERY with CHG Soap.   6. Use CHG Soap as you would any  other liquid soap. You can apply CHG directly to the skin and wash gently with a scrungie or a clean washcloth.   7. Apply the CHG Soap to your body ONLY FROM THE NECK DOWN.  Do not use on open wounds or open sores. Avoid contact with your eyes, ears, mouth and genitals (private parts). Wash Face and genitals (private parts)  with your normal soap.   8. Wash thoroughly, paying special attention to the area where your surgery will be performed.  9. Thoroughly rinse your body with warm water from the neck down.  10. DO NOT shower/wash with your normal soap after using and rinsing off the CHG Soap.  11. Pat yourself dry with a CLEAN TOWEL.  12. Wear CLEAN PAJAMAS to bed the night before surgery  13. Place CLEAN SHEETS on your bed the night before your surgery  14. DO NOT  SLEEP WITH PETS.   Day of Surgery: Take a shower.  Wear Clean/Comfortable clothing the morning of surgery Do not apply any deodorants/lotions.   Remember to brush your teeth WITH YOUR REGULAR TOOTHPASTE.   Please read over the following fact sheets that you were given.

## 2020-04-05 ENCOUNTER — Other Ambulatory Visit: Payer: Self-pay

## 2020-04-05 ENCOUNTER — Encounter (HOSPITAL_COMMUNITY): Payer: Self-pay

## 2020-04-05 ENCOUNTER — Ambulatory Visit (HOSPITAL_COMMUNITY)
Admission: RE | Admit: 2020-04-05 | Discharge: 2020-04-05 | Disposition: A | Discharge status: Home / Self Care | Payer: 59 | Source: Ambulatory Visit | Attending: Surgery | Admitting: Surgery

## 2020-04-05 ENCOUNTER — Encounter (HOSPITAL_COMMUNITY)
Admission: RE | Admit: 2020-04-05 | Discharge: 2020-04-05 | Disposition: A | Discharge status: Home / Self Care | Payer: 59 | Source: Ambulatory Visit | Attending: Surgery | Admitting: Surgery

## 2020-04-05 DIAGNOSIS — Z01818 Encounter for other preprocedural examination: Secondary | ICD-10-CM

## 2020-04-05 DIAGNOSIS — I350 Nonrheumatic aortic (valve) stenosis: Secondary | ICD-10-CM | POA: Insufficient documentation

## 2020-04-05 LAB — COMPREHENSIVE METABOLIC PANEL
ALT: 23 U/L (ref 0–44)
AST: 52 U/L — ABNORMAL HIGH (ref 15–41)
Albumin: 3.5 g/dL (ref 3.5–5.0)
Alkaline Phosphatase: 77 U/L (ref 38–126)
Anion gap: 9 (ref 5–15)
BUN: 11 mg/dL (ref 6–20)
CO2: 25 mmol/L (ref 22–32)
Calcium: 9.1 mg/dL (ref 8.9–10.3)
Chloride: 104 mmol/L (ref 98–111)
Creatinine, Ser: 0.99 mg/dL (ref 0.44–1.00)
GFR, Estimated: >60 mL/min (ref >60)
Glucose, Bld: 95 mg/dL (ref 70–99)
Potassium: 4.2 mmol/L (ref 3.5–5.1)
Sodium: 138 mmol/L (ref 135–145)
Total Bilirubin: 0.5 mg/dL (ref 0.3–1.2)
Total Protein: 6.7 g/dL (ref 6.5–8.1)

## 2020-04-05 LAB — BLOOD GAS, ARTERIAL
Acid-Base Excess: 0.9 mmol/L (ref 0.0–2.0)
Bicarbonate: 25.5 mmol/L (ref 20.0–28.0)
Drawn by: 58793
FIO2: 21
O2 Saturation: 95.7 %
Patient temperature: 37
pCO2 arterial: 44.7 mmHg (ref 32.0–48.0)
pH, Arterial: 7.375 (ref 7.350–7.450)
pO2, Arterial: 83.5 mmHg (ref 83.0–108.0)

## 2020-04-05 LAB — CBC
HCT: 35.8 % — ABNORMAL LOW (ref 36.0–46.0)
Hemoglobin: 10.2 g/dL — ABNORMAL LOW (ref 12.0–15.0)
MCH: 22.9 pg — ABNORMAL LOW (ref 26.0–34.0)
MCHC: 28.5 g/dL — ABNORMAL LOW (ref 30.0–36.0)
MCV: 80.3 fL (ref 80.0–100.0)
Platelets: 187 10*3/uL (ref 150–400)
RBC: 4.46 MIL/uL (ref 3.87–5.11)
RDW: 16.6 % — ABNORMAL HIGH (ref 11.5–15.5)
WBC: 3 10*3/uL — ABNORMAL LOW (ref 4.0–10.5)
nRBC: 0 % (ref 0.0–0.2)

## 2020-04-05 LAB — TYPE AND SCREEN
ABO/RH(D): A POS
Antibody Screen: NEGATIVE

## 2020-04-05 LAB — PROTIME-INR
INR: 1 (ref 0.8–1.2)
Prothrombin Time: 13.2 seconds (ref 11.4–15.2)

## 2020-04-05 LAB — URINALYSIS, ROUTINE W REFLEX MICROSCOPIC
Bilirubin Urine: NEGATIVE
Glucose, UA: NEGATIVE mg/dL
Hgb urine dipstick: NEGATIVE
Ketones, ur: NEGATIVE mg/dL
Leukocytes,Ua: NEGATIVE
Nitrite: NEGATIVE
Protein, ur: NEGATIVE mg/dL
Specific Gravity, Urine: 1.014 (ref 1.005–1.030)
pH: 6 (ref 5.0–8.0)

## 2020-04-05 LAB — SURGICAL PCR SCREEN
MRSA, PCR: NEGATIVE
Staphylococcus aureus: NEGATIVE

## 2020-04-05 LAB — HEMOGLOBIN A1C
Hgb A1c MFr Bld: 5.7 % — ABNORMAL HIGH (ref 4.8–5.6)
Mean Plasma Glucose: 116.89 mg/dL

## 2020-04-05 LAB — APTT: aPTT: 24 seconds (ref 24–36)

## 2020-04-05 MED ORDER — EPINEPHRINE HCL 5 MG/250ML IV SOLN IN NS
0.0000 ug/min | INTRAVENOUS | Status: DC
  Filled 2020-04-05: qty 250

## 2020-04-05 MED ORDER — DEXMEDETOMIDINE HCL IN NACL 400 MCG/100ML IV SOLN
0.1000 ug/kg/h | INTRAVENOUS | Status: DC
  Filled 2020-04-05: qty 100

## 2020-04-05 MED ORDER — TRANEXAMIC ACID (OHS) BOLUS VIA INFUSION
15.0000 mg/kg | INTRAVENOUS | Status: DC
  Filled 2020-04-05: qty 2151

## 2020-04-05 MED ORDER — INSULIN REGULAR(HUMAN) IN NACL 100-0.9 UT/100ML-% IV SOLN
INTRAVENOUS | Status: DC
  Filled 2020-04-05: qty 100

## 2020-04-05 MED ORDER — SODIUM CHLORIDE 0.9 % IV SOLN
750.0000 mg | INTRAVENOUS | Status: DC
  Filled 2020-04-05: qty 750

## 2020-04-05 MED ORDER — NOREPINEPHRINE 4 MG/250ML-% IV SOLN
0.0000 ug/min | INTRAVENOUS | Status: DC
  Filled 2020-04-05: qty 250

## 2020-04-05 MED ORDER — VANCOMYCIN HCL 1500 MG/300ML IV SOLN
1500.0000 mg | INTRAVENOUS | Status: DC
  Filled 2020-04-05: qty 300

## 2020-04-05 MED ORDER — SODIUM CHLORIDE 0.9 % IV SOLN
INTRAVENOUS | Status: DC
  Filled 2020-04-05: qty 30

## 2020-04-05 MED ORDER — TRANEXAMIC ACID (OHS) PUMP PRIME SOLUTION
2.0000 mg/kg | INTRAVENOUS | Status: DC
  Filled 2020-04-05: qty 2.89

## 2020-04-05 MED ORDER — NITROGLYCERIN IN D5W 200-5 MCG/ML-% IV SOLN
2.0000 ug/min | INTRAVENOUS | Status: DC
  Filled 2020-04-05: qty 250

## 2020-04-05 MED ORDER — MAGNESIUM SULFATE 50 % IJ SOLN
40.0000 meq | INTRAMUSCULAR | Status: DC
  Filled 2020-04-05: qty 9.85

## 2020-04-05 MED ORDER — PLASMA-LYTE 148 IV SOLN
INTRAVENOUS | Status: DC
  Filled 2020-04-05: qty 2.5

## 2020-04-05 MED ORDER — MILRINONE LACTATE IN DEXTROSE 20-5 MG/100ML-% IV SOLN
0.3000 ug/kg/min | INTRAVENOUS | Status: DC
  Filled 2020-04-05: qty 100

## 2020-04-05 MED ORDER — TRANEXAMIC ACID 1000 MG/10ML IV SOLN
1.5000 mg/kg/h | INTRAVENOUS | Status: DC
  Filled 2020-04-05: qty 25

## 2020-04-05 MED ORDER — SODIUM CHLORIDE 0.9 % IV SOLN
1.5000 g | INTRAVENOUS | Status: DC
  Filled 2020-04-05: qty 1.5

## 2020-04-05 MED ORDER — POTASSIUM CHLORIDE 2 MEQ/ML IV SOLN
80.0000 meq | INTRAVENOUS | Status: DC
  Filled 2020-04-05: qty 40

## 2020-04-05 MED ORDER — TRANEXAMIC ACID (OHS) PUMP PRIME SOLUTION
2.0000 mg/kg | INTRAVENOUS | Status: DC
  Filled 2020-04-05: qty 2.87

## 2020-04-05 MED ORDER — PHENYLEPHRINE HCL-NACL 20-0.9 MG/250ML-% IV SOLN
30.0000 ug/min | INTRAVENOUS | Status: AC
  Administered 2020-04-06: 20 ug/min via INTRAVENOUS
  Filled 2020-04-05: qty 250

## 2020-04-05 MED ORDER — TRANEXAMIC ACID (OHS) BOLUS VIA INFUSION
15.0000 mg/kg | INTRAVENOUS | Status: AC
  Administered 2020-04-06: 2166 mg via INTRAVENOUS
  Filled 2020-04-05: qty 2166

## 2020-04-05 NOTE — Anesthesia Preprocedure Evaluation (Addendum)
Anesthesia Evaluation  Patient identified by MRN, date of birth, ID band Patient awake    Reviewed: Allergy & Precautions, NPO status , Patient's Chart, lab work & pertinent test results  History of Anesthesia Complications (+) DIFFICULT AIRWAY and history of anesthetic complications  Airway Mallampati: IV  TM Distance: <3 FB Neck ROM: Full  Mouth opening: Limited Mouth Opening  Dental  (+) Teeth Intact, Dental Advisory Given   Pulmonary neg shortness of breath, sleep apnea , neg COPD, neg recent URI, former smoker,  Covid-19 Nucleic Acid Test Results Lab Results      Component                Value               Date                      SARSCOV2NAA              NEGATIVE            04/04/2020                SARSCOV2NAA              NEGATIVE            01/17/2020                SARSCOV2NAA              NEGATIVE            07/12/2019                SARSCOV2NAA              NEGATIVE            04/08/2019              breath sounds clear to auscultation       Cardiovascular hypertension, Pt. on medications and Pt. on home beta blockers (-) angina(-) CAD and (-) Past MI + Valvular Problems/Murmurs  Rhythm:Regular + Systolic murmurs 1. Left ventricular ejection fraction, by estimation, is 65 to 70%. The  left ventricle has normal function. The left ventricle has no regional  wall motion abnormalities. There is mild-to-moderate concentric left  ventricular hypertrophy. Left ventricular  diastolic parameters are consistent with Grade II diastolic dysfunction  (pseudonormalization).  2. Right ventricular systolic function is normal. The right ventricular  size is normal.  3. A small pericardial effusion appears to be present around the RA  although visualization is limited.  4. The mitral valve is normal in structure. Trivial mitral valve  regurgitation.  5. The aortic valve is tricuspid. There is moderate calcification of the   aortic valve. There is moderate thickening of the aortic valve. There is  severe aortic valve stenosis with a mean gradient of , peak gradient  71.78mmHg, peak velocity 4.2 m/s,  AVA 0.9cm2 by continuity and DOI 0.26  6. The inferior vena cava is normal in size with greater than 50%  respiratory variability, suggesting right atrial pressure of 3 mmHg.   Cath 1/22:  Ms. Cenci has severe AS with normal coronary arteries.  She is being evaluated by Dr. Laneta Simmers for aortic valve replacement.   Neuro/Psych  Headaches, neg Seizures PSYCHIATRIC DISORDERS Anxiety    GI/Hepatic Neg liver ROS, GERD  Medicated,Patient states she has been getting worked up for esophageal diverticuli   Endo/Other  Hypothyroidism   Renal/GU Renal diseaseLab Results  Component                Value               Date                      CREATININE               0.99                04/05/2020                Musculoskeletal  (+) Arthritis ,   Abdominal   Peds  Hematology  (+) Blood dyscrasia, anemia , Lab Results      Component                Value               Date                      WBC                      3.0 (L)             04/05/2020                HGB                      10.2 (L)            04/05/2020                HCT                      35.8 (L)            04/05/2020                MCV                      80.3                04/05/2020                PLT                      187                 04/05/2020              Anesthesia Other Findings   Reproductive/Obstetrics                            Anesthesia Physical Anesthesia Plan  ASA: IV  Anesthesia Plan: General   Post-op Pain Management:    Induction: Intravenous  PONV Risk Score and Plan: 3 and Treatment may vary due to age or medical condition  Airway Management Planned: Oral ETT and Video Laryngoscope Planned  Additional Equipment: Arterial line, CVP, PA Cath, TEE and Ultrasound  Guidance Line Placement  Intra-op Plan:   Post-operative Plan: Post-operative intubation/ventilation  Informed Consent: I have reviewed the patients History and Physical, chart, labs and discussed the procedure including the risks, benefits and alternatives for the proposed anesthesia with the patient or authorized representative who has indicated his/her understanding and acceptance.     Dental advisory given  Plan Discussed with: CRNA and Surgeon  Anesthesia Plan Comments: (History of difficult intubation.  Per intubation note 12/09/2010:  Intubation Type: IV induction and Circoid Pressure applied Ventilation: Two handed mask ventilation required and Oral airway inserted - appropriate to patient size Laryngoscope size: Glide scope #4, then #3 withe better view. Grade View: Grade IV Tube type: Oral Number of attempts: 2 Airway Equipment and Method: patient positioned with wedge pillow,  stylet,  video-laryngoscopy and oral airway Placement Confirmation: ETT inserted through vocal cords under direct vision,  positive ETCO2,  CO2 detector and breath sounds checked- equal and bilateral Secured at: 24 cm Tube secured with: Tape (clear tape) Dental Injury: Teeth and Oropharynx as per pre-operative assessment  Difficulty Due To: Difficulty was anticipated Future Recommendations: Recommend- induction with short-acting agent, and alternative techniques readily available)       Anesthesia Quick Evaluation

## 2020-04-05 NOTE — Progress Notes (Addendum)
PCP - Dr. Milinda Antis Cardiologist - Dr. Tenny Craw  Chest x-ray - 04/05/20 EKG - 04/05/20 Stress Test -  ECHO - 01/17/20 Cardiac Cath - 01/19/20  Sleep Study - yes CPAP - no   Blood Thinner Instructions: pt last dose Coumadin 03/31/20 per pt Aspirin Instructions:   COVID TEST- 04/05/31 negative   Anesthesia review: yes  Patient denies shortness of breath, fever, cough and chest pain at PAT appointment   All instructions explained to the patient, with a verbal understanding of the material. Patient agrees to go over the instructions while at home for a better understanding. Patient also instructed to self quarantine after being tested for COVID-19. The opportunity to ask questions was provided.

## 2020-04-05 NOTE — H&P (Signed)
[address]       [place] [address]             [phone]      Cardiothoracic Surgery Admission History and Physical   Referring Provider is Pricilla Riffle, MD Primary Cardiologist is Dietrich Pates, MD PCP is Tower, Audrie Gallus, MD      Chief Complaint  Patient presents with  . 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 Maren Beach, OSA, and moderate aortic stenosis echocardiogram on 11/22/2018 had shown a mean gradient across aortic valve of 33 mmHg with a peak gradient of 54 mmHg.  Dimensionless index was 0.26.  Left ventricular ejection fraction was 60 to 65%.  She was admitted on 01/17/2020 after presenting with left-sided chest discomfort after awakening in the morning she described this as a twinge of pain that began in the chest and radiated to the left arm and shoulder lasting a couple minutes and then resolving and coming back it was not associated with exertion but was repetitive.  She ruled out for MI.  A repeat 2D echo on 01/17/2020 showed an increase in the mean gradient to 46 mmHg with a peak gradient of 72 mmHg.  Dimensionless index was 0.26 with a valve area of 0.9 cm.  Left ventricular ejection fraction was 65 to 70%.  She subsequently underwent cardiac catheterization on 01/19/2020 showing a peak to peak gradient across aortic valve of 58 mmHg with an LVEDP of 20 and a mean pulmonary wedge pressure of 18.  PA pressure was 42/14 with a mean of 30.  There is no significant coronary disease.  She says that since she went home she has had no further chest discomfort.  She continues to have exertional fatigue.  She reports exertional  shortness of breath but it is not always present.  She has occasional episodes of dizziness.  She has never had syncope.  She denies peripheral edema.  She has had no orthopnea      Past Medical History:  Diagnosis Date  . Arthritis    Lupus - hands/knees  . 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 GERD (gastroesophageal reflux disease)    on prilosec r/t gastric sleeve surgery  . HLD (hyperlipidemia)   . Iron deficiency anemia    IV dextran [place]  . Nephritis and nephropathy, not specified as acute or chronic, with unspecified pathological lesion in kidney   . Nonspecific abnormal results of liver function study   . Obesity, unspecified   . Other specified acquired hypothyroidism   . Panic disorder without agoraphobia   . Personal history of venous thrombosis and embolism 2002   during pregnancy; ?factor 5 leiden (sees heme)  . Pleural effusion 2005   c/w lupus initial w/u; recurrent Right as pred tapered off July 2011  . Sleep apnea   . 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 Unspecified essential hypertension          Past  Surgical History:  Procedure Laterality Date  . BIOPSY  04/12/2019   Procedure: BIOPSY;  Surgeon: Tressia Danas, MD;  Location: [facility];  Service: Gastroenterology;;  . ESOPHAGEAL MANOMETRY N/A 07/15/2019   Procedure: ESOPHAGEAL MANOMETRY (EM);  Surgeon: Napoleon Form, MD;  Location: [facility];  Service: Endoscopy;  Laterality: N/A;  . ESOPHAGOGASTRODUODENOSCOPY (EGD) WITH PROPOFOL N/A 04/12/2019   Procedure: ESOPHAGOGASTRODUODENOSCOPY (EGD) WITH PROPOFOL;  Surgeon: Tressia Danas, MD;  Location: [facility];  Service: Gastroenterology;  Laterality: N/A;  . gastric sleeve  8/12   bariatric surgery Dr Adolphus Birchwood   . LAPAROSCOPIC TUBAL LIGATION  12/09/2010    Procedure: LAPAROSCOPIC TUBAL LIGATION;  Surgeon: Juluis Mire;  Location: [facility];  Service: Gynecology;  Laterality: Bilateral;  Attempted see Nursing note  . pleurocentesis  10/2004  . Pleuryx catheter placement  9/11    VanTright----removed 9/11  . RENAL BIOPSY  09/24/2012  . RIGHT/LEFT HEART CATH AND CORONARY ANGIOGRAPHY N/A 01/19/2020   Procedure: RIGHT/LEFT HEART CATH AND CORONARY ANGIOGRAPHY;  Surgeon: Runell Gess, MD;  Location: [facility];  Service: Cardiovascular;  Laterality: N/A;  . svd     x 3  . THORACENTESIS  2010   with penumonia  . WISDOM TOOTH EXTRACTION           Family History  Problem Relation Age of Onset  . Lupus Sister   . Hypertension Father   . Hyperlipidemia Father   . Leukemia Sister   . Diabetes Other        GM  . Colon cancer Neg Hx   . Esophageal cancer Neg Hx   . Pancreatic cancer Neg Hx     Social History        Socioeconomic History  . Marital status: Divorced    Spouse name: Not on file  . Number of children: 3  . Years of education: Not on file  . Highest education level: Not on file  Occupational History  . Occupation: Psychologist, educational at [employer]  . Smoking status: Former Smoker    Packs/day: 0.50    Years: 10.00    Pack years: 5.00    Types: Cigarettes    Quit date: 05/04/2009    Years since quitting: 10.7  . Smokeless tobacco: Never Used  . Tobacco comment: Socially x10 years (1 pack/month)  Vaping Use  . Vaping Use: Never used  Substance and Sexual Activity  . Alcohol use: Yes    Alcohol/week: 0.0 standard drinks    Comment: rare  . Drug use: No  . Sexual activity: Not on file  Other Topics Concern  . Not on file  Social History Narrative  . Not on file   Social Determinants of Health   Financial Resource Strain: Not on file  Food Insecurity: Not on file  Transportation Needs: Not on file  Physical Activity: Not on file  Stress: Not on file   Social Connections: Not on file  Intimate Partner Violence: Not on file          Current Outpatient Medications  Medication Sig Dispense Refill  . bismuth subsalicylate (PEPTO BISMOL) 262 MG/15ML suspension Take 30 mLs by mouth every 6 (six) hours as needed for indigestion or diarrhea or loose stools.    . calcium carbonate (TUMS - DOSED IN MG ELEMENTAL CALCIUM) 500 MG chewable tablet Chew 1-2 tablets by mouth 3 (three) times daily as needed for indigestion or heartburn.    . cyanocobalamin (,VITAMIN B-12,) 1000 MCG/ML  injection Inject 1,000 mcg into the muscle every 30 (thirty) days.     Marland Kitchen FERREX 150 150 MG capsule TAKE 1 CAPSULE BY MOUTH EVERY DAY (Patient taking differently: Take 150 mg by mouth daily.) 90 capsule 0  . hydroxychloroquine (PLAQUENIL) 200 MG tablet Take 200 mg by mouth 2 (two) times daily.    Marland Kitchen labetalol (NORMODYNE) 200 MG tablet TAKE 1 AND 1/2 TABLETS BY MOUTH TWICE A DAY 270 tablet 3  . levothyroxine (SYNTHROID) 200 MCG tablet TAKE 1 TABLET (200 MCG TOTAL) BY MOUTH DAILY BEFORE BREAKFAST. 90 tablet 1  . Multiple Vitamins-Minerals (BARIATRIC MULTIVITAMINS/IRON) CAPS Take 1 tablet by mouth daily.    . nitroGLYCERIN (NITROSTAT) 0.4 MG SL tablet Take 1 tablet sublingual 10-15 minutes before meals, three times daily 50 tablet 3  . omeprazole (PRILOSEC) 20 MG capsule TAKE 1 CAPSULE (20 MG TOTAL) BY MOUTH 2 (TWO) TIMES DAILY BEFORE A MEAL. 180 capsule 1  . PARoxetine (PAXIL) 40 MG tablet TAKE 1 TABLET BY MOUTH EVERY DAY IN THE MORNING 90 tablet 3  . rosuvastatin (CRESTOR) 20 MG tablet TAKE 1 TABLET BY MOUTH EVERY DAY 90 tablet 3  . warfarin (COUMADIN) 7.5 MG tablet TAKE BY MOUTH 11.25 MG (1.5 TABLETS) MON, WED, FRI, SAT AND TAKE  (2 TABLETS) SUN, TUES, THURS OR AS INSTRUCTED BY COUMADIN CLINIC 145 tablet 0  . cefUROXime (CEFTIN) 500 MG tablet Take 1 tablet (500 mg total) by mouth 2 (two) times daily with a meal. 20 tablet 0   No current facility-administered  medications for this visit.         Allergies  Allergen Reactions  . Lisinopril Cough  . Lovenox [Enoxaparin Sodium] Itching  . Moxifloxacin Other (See Comments)    REACTION: hallucinations  . Lovenox [Enoxaparin]     Itching       Review of Systems:              General:                      normal appetite, + decreased energy, no weight gain, no weight loss, no fever             Cardiac:                       nochest pain with exertion, + chest pain at rest, +SOB with moderate exertion, no resting SOB, no PND, no orthopnea, no palpitations, no arrhythmia, no atrial fibrillation, no LE edema, + dizzy spells, no syncope             Respiratory:                 + exertional shortness of breath, no home oxygen, no productive cough, no dry cough, no bronchitis, no wheezing, no hemoptysis, no asthma, no pain with inspiration or cough, + sleep apnea, no CPAP at night             GI:                               no difficulty swallowing, + reflux, + frequent heartburn, no hiatal hernia, no abdominal pain, no constipation, no diarrhea, no hematochezia, no hematemesis, no melena             GU:  no dysuria,  no frequency, no urinary tract infection, no hematuria, no kidney stones, +lupus kidney disease             Vascular:                     no pain suggestive of claudication, no pain in feet, + leg cramps, no varicose veins, + DVT, no non-healing foot ulcer             Neuro:                         no stroke, no TIA's, no seizures, + headaches, no temporary blindness one eye,  no slurred speech, no peripheral neuropathy, + chronic pain, no instability of gait, no memory/cognitive dysfunction             Musculoskeletal:         + arthritis,  joint swelling, no myalgias, no difficulty walking, normal mobility              Skin:                            no rash, + itching, no skin infections, no pressure sores or ulcerations             Psych:                          no anxiety, no depression, no nervousness, no unusual recent stress             Eyes:                           no blurry vision, no floaters, no recent vision changes, + wears glasses or contacts             ENT:                            no hearing loss, no loose or painful teeth, no dentures, has implants, last saw dentist Dec 25, 2019.             Hematologic:               no easy bruising, no abnormal bleeding, + clotting disorder, no frequent epistaxis             Endocrine:                   no diabetes, does not check CBG's at home               Physical Exam:              BP 106/68 (BP Location: Left Arm, Patient Position: Sitting)   Pulse 81   Resp 20   Ht  (1.651 m)   Wt (!) 313 lb (142 kg)   LMP 11/08/2010   SpO2 91% Comment: RA with mask on  BMI 52.09 kg/m              General:                      well-appearing             HEENT:  Unremarkable, NCAT, PERLA, EOMI             Neck:                           no JVD, no bruits, no adenopathy              Chest:                          clear to auscultation, symmetrical breath sounds, no wheezes, no rhonchi              CV:                              RRR, grade lll/VI crescendo/decrescendo murmur heard best at RSB,  no diastolic murmur             Abdomen:                    soft, non-tender, no masses              Extremities:                 warm, well-perfused, pulses palpable at ankle, mild LE edema             Rectal/GU                   Deferred             Neuro:                         Grossly non-focal and symmetrical throughout             Skin:                            Clean and dry, no rashes, no breakdown   Diagnostic Tests:  ECHOCARDIOGRAM LIMITED REPORT       Patient Name:  [name] Date of Exam: 01/17/2020  Medical Rec #: [id number]   Height:  Accession #:  [id number]   Weight:  Date of Birth: [birth year]    BSA:  Patient Age:  54  years    BP:      131/74 mmHg  Patient Gender: F       HR:      79 bpm.  Exam Location: Inpatient   Procedure: Limited Echo, Limited Color Doppler and Cardiac Doppler   STAT ECHO   Indications:  chest pain    History:    Patient has prior history of Echocardiogram examinations,  most         recent 11/22/2018. Chronic kidney disease. lupus.,         Signs/Symptoms:Murmur; Risk Factors:Dyslipidemia.    Sonographer:  Delcie Roch  Referring Phys: [id number] JENNIFER YATES   IMPRESSIONS    1. Left ventricular ejection fraction, by estimation, is 65 to 70%. The  left ventricle has normal function. The left ventricle has no regional  wall motion abnormalities. There is mild-to-moderate concentric left  ventricular hypertrophy. Left ventricular  diastolic parameters are consistent with Grade II diastolic dysfunction  (pseudonormalization).  2. Right ventricular systolic function is normal. The right ventricular  size is normal.  3. A small pericardial effusion appears to be present around the RA  although  visualization is limited.  4. The mitral valve is normal in structure. Trivial mitral valve  regurgitation.  5. The aortic valve is tricuspid. There is moderate calcification of the  aortic valve. There is moderate thickening of the aortic valve. There is  severe aortic valve stenosis with a mean gradient of , peak gradient  71.90mmHg, peak velocity 4.2 m/s,  AVA 0.9cm2 by continuity and DOI 0.26  6. The inferior vena cava is normal in size with greater than 50%  respiratory variability, suggesting right atrial pressure of 3 mmHg.   Comparison(s): Compared to prior echo on 11/22/2018, the aortic stenosis  is now severe.   FINDINGS  Left Ventricle: Left ventricular ejection fraction, by estimation, is 65  to 70%. The left ventricle has normal function. The left ventricle has no  regional wall motion abnormalities.  The left ventricular internal cavity  size was normal in size. There is  mild-to-moderate concentric left ventricular hypertrophy. Left  ventricular diastolic parameters are consistent with Grade II diastolic  dysfunction (pseudonormalization).   Right Ventricle: The right ventricular size is normal. Right ventricular  systolic function is normal.   Left Atrium: Left atrial size was normal in size.   Right Atrium: Right atrial size was normal in size.   Pericardium: A small pericardial effusion appears to be present around the  RA although visualization is limited.   Mitral Valve: The mitral valve is normal in structure. There is mild  thickening of the mitral valve leaflet(s). There is mild calcification of  the mitral valve leaflet(s). Mild mitral annular calcification. Trivial  mitral valve regurgitation.   Tricuspid Valve: The tricuspid valve is normal in structure. Tricuspid  valve regurgitation is trivial.   Aortic Valve: The aortic valve is tricuspid. There is moderate  calcification of the aortic valve. There is moderate thickening of the  aortic valve. Severe aortic stenosis is present. Aortic valve mean  gradient measures 46.0 mmHg. Aortic valve peak  gradient measures 71.6 mmHg. Aortic valve area, by VTI measures 0.90 cm.  DOI 0.26   Pulmonic Valve: The pulmonic valve was normal in structure. Pulmonic valve  regurgitation is not visualized.   Aorta: The aortic root and ascending aorta are structurally normal, with  no evidence of dilitation.   Venous: The inferior vena cava is normal in size with greater than 50%  respiratory variability, suggesting right atrial pressure of 3 mmHg.   IAS/Shunts: No atrial level shunt detected by color flow Doppler.   LEFT VENTRICLE  PLAX 2D  LVIDd:     4.80 cm  LVIDs:     2.90 cm  LV PW:     1.20 cm  LV IVS:    1.10 cm  LVOT diam:   2.10 cm  LV SV:     83  LVOT Area:   3.46 cm     AORTIC  VALVE  AV Area (Vmax):  0.79 cm  AV Area (VTI):   0.90 cm  AV Vmax:      423.00 cm/s  AV VTI:      0.923 m  AV Peak Grad:   71.6 mmHg  AV Mean Grad:   46.0 mmHg  LVOT Vmax:     96.90 cm/s  LVOT VTI:     0.241 m  LVOT/AV VTI ratio: 0.26    AORTA  Ao Root diam: 3.10 cm  Ao Asc diam: 3.30 cm   MV E velocity: 112.00 cm/s  MV A velocity: 127.00 cm/s SHUNTS  MV E/A  ratio: 0.88     Systemic VTI: 0.24 m               Systemic Diam: 2.10 cm   Laurance Flatten MD  Electronically signed by Laurance Flatten MD  Signature Date/Time: 01/17/2020/6:08:26 PM      Final   Physicians  Panel Physicians Referring Physician Case Authorizing Physician  Runell Gess, MD (Primary)      Procedures  RIGHT/LEFT HEART CATH AND CORONARY ANGIOGRAPHY   Conclusion    Hemodynamic findings consistent with aortic valve stenosis.  Nychelle A Parrottis a 55 y.o.female   [id number] LOCATION: FACILITY: [facility]  PHYSICIAN: Nanetta Batty, M.D. 10-29-1965   DATE OF PROCEDURE: 02/14/2020  DATE OF DISCHARGE:     CARDIAC CATHETERIZATION    History obtained from chart review.Melora A Parrottis a 55 y.o.femalewith a historyof moderate aortic stenosis on Echo in 11/2018, recurrent pleural effusion with superinfection on left s/p VATS and surgical pleurodesis in 2010, Factor V Leiden with prior PE/DVT on Coumadin, intracerebral hemorrhage, lupus with nephritis, hypertension, hyperlipidemia, GERD, hypothyroidism,and morbid obesity s/p gastric sleeve in 2012who is being seen today for the evaluation ofaortic stenosisat the request of Dr. Benjamine Mola.She presents today for right left heart cath to define her anatomy and physiology.   PROCEDURE DESCRIPTION:  The patient was brought to the second floor [facility] cath lab in the postabsorptive state. She was premedicated . Her right wrist and antecubital  fossa Were prepped and shaved in usual sterile fashion. Xylocaine 1% was used for local anesthesia. A 6 French sheath was inserted into the right radial artery using standard Seldinger technique. A 5 French sheath was inserted into the right antecubital vein. A 5 French balloontipped Swan-Ganz catheter was advanced through the right heart chambers obtaining sequential pressures and blood samples for the determination of Fick cardiac output. S the patient received  4000 units of heparin intravenously. 5 [place] TIG catheter right Judkins catheters were used for selective coronary angiography and obtaining left heart pressures. Isovue dye was used for the entirety of the case. Retrograde aorta, left ventricular and pullback pressures were recorded. Radial cocktail was administered via the SideArm sheath.   IMPRESSION:Ms. Prevost has severe AS with normal coronary arteries. She is being evaluated by Dr. Laneta Simmers for aortic valve replacement.  Nanetta Batty. MD, [facility] 02/14/2020 4:38 PM       Indications  Aortic stenosis, severe [I35.0 (ICD-10-CM)]  Chest pain of uncertain etiology [R07.9 (ICD-10-CM)]   Procedural Details  Technical Details Estimated blood loss <50 mL.   During this procedure medications were administered to achieve and maintain moderate conscious sedation while the patient's heart rate, blood pressure, and oxygen saturation were continuously monitored and I was present face-to-face 100% of this time.   Medications (Filter: Administrations occurring from 1020 to 1113 on 01/19/20) (important) Continuous medications are totaled by the amount administered until 01/19/20 1113.    midazolam (VERSED) injection (mg) Total dose:  1 mg  Date/Time Rate/Dose/Volume Action   01/19/20 1034 1 mg Given    fentaNYL (SUBLIMAZE) injection (mcg) Total dose:  25 mcg  Date/Time Rate/Dose/Volume Action   01/19/20 1035 25 mcg Given    lidocaine (PF)  (XYLOCAINE) 1 % injection (mL) Total volume:  4 mL  Date/Time Rate/Dose/Volume Action   01/19/20 1041 2 mL Canceled Entry   1041 2 mL Given   1049 2 mL Given    Radial Cocktail (Verapamil 5 mg, NTG, Lidocaine) Total dose:  Cannot be calculated*  *Administration dose  not documented Date/Time Rate/Dose/Volume Action   01/19/20 1053  Given    heparin sodium (porcine) injection (Units) Total dose:  7,000 Units  Date/Time Rate/Dose/Volume Action   01/19/20 1054 7,000 Units Given    iohexol (OMNIPAQUE) 350 MG/ML injection (mL) Total volume:  50 mL  Date/Time Rate/Dose/Volume Action   01/19/20 1108 50 mL Given    Heparin (Porcine) in NaCl 1000-0.9 UT/500ML-% SOLN (mL) Total volume:  1,000 mL  Date/Time Rate/Dose/Volume Action   01/19/20 1104 500 mL Given   1104 500 mL Given    bisacodyl (DULCOLAX) EC tablet 5 mg (mg) Total dose:  Cannot be calculated*  *Administration dose not documented Date/Time Rate/Dose/Volume Action   01/19/20 1026 *Not included in total MAR Hold    calcium carbonate (TUMS - dosed in mg elemental calcium) chewable tablet 200 mg of elemental calcium (mg of elemental calcium) Total dose:  Cannot be calculated* Dosing weight:  142.7  *Administration dose not documented Date/Time Rate/Dose/Volume Action   01/19/20 1026 *Not included in total MAR Hold    docusate sodium (COLACE) capsule 100 mg (mg) Total dose:  Cannot be calculated*  *Administration dose not documented Date/Time Rate/Dose/Volume Action   01/19/20 1026 *Not included in total MAR Hold    hydrALAZINE (APRESOLINE) injection 5 mg (mg) Total dose:  Cannot be calculated*  *Administration dose not documented Date/Time Rate/Dose/Volume Action   01/19/20 1026 *Not included in total MAR Hold    HYDROcodone-acetaminophen (NORCO/VICODIN) 5-325 MG per tablet 1-2 tablet (tablet) Total dose:  Cannot be calculated*  *Administration dose not  documented Date/Time Rate/Dose/Volume Action   01/19/20 1026 *Not included in total MAR Hold    hydroxychloroquine (PLAQUENIL) tablet 200 mg (mg) Total dose:  Cannot be calculated*  *Administration dose not documented Date/Time Rate/Dose/Volume Action   01/19/20 1026 *Not included in total MAR Hold    labetalol (NORMODYNE) tablet 300 mg (mg) Total dose:  Cannot be calculated*  *Administration dose not documented Date/Time Rate/Dose/Volume Action   01/19/20 1026 *Not included in total MAR Hold    levothyroxine (SYNTHROID) tablet 200 mcg (mcg) Total dose:  Cannot be calculated*  *Administration dose not documented Date/Time Rate/Dose/Volume Action   01/19/20 1026 *Not included in total MAR Hold    morphine 2 MG/ML injection 2 mg (mg) Total dose:  Cannot be calculated*  *Administration dose not documented Date/Time Rate/Dose/Volume Action   01/19/20 1026 *Not included in total MAR Hold    multivitamin with minerals tablet 1 tablet (tablet) Total dose:  Cannot be calculated* Dosing weight:  142.7  *Administration dose not documented Date/Time Rate/Dose/Volume Action   01/19/20 1026 *Not included in total MAR Hold    pantoprazole (PROTONIX) EC tablet 40 mg (mg) Total dose:  Cannot be calculated*  *Administration dose not documented Date/Time Rate/Dose/Volume Action   01/19/20 1026 *Not included in total MAR Hold    PARoxetine (PAXIL) tablet 40 mg (mg) Total dose:  Cannot be calculated*  *Administration dose not documented Date/Time Rate/Dose/Volume Action   01/19/20 1026 *Not included in total MAR Hold    polyethylene glycol (MIRALAX / GLYCOLAX) packet 17 g (g) Total dose:  Cannot be calculated*  *Administration dose not documented Date/Time Rate/Dose/Volume Action   01/19/20 1026 *Not included in total MAR Hold    protein supplement (ENSURE MAX) liquid (oz) Total dose:  Cannot be calculated* Dosing weight:   142.7  *Administration dose not documented Date/Time Rate/Dose/Volume Action   01/19/20 1026 *Not included in total MAR Hold    rosuvastatin (CRESTOR) tablet  20 mg (mg) Total dose:  Cannot be calculated*  *Administration dose not documented Date/Time Rate/Dose/Volume Action   01/19/20 1026 *Not included in total MAR Hold    sodium chloride flush (NS) 0.9 % injection 3 mL (mL) Total dose:  Cannot be calculated*  *Administration dose not documented Date/Time Rate/Dose/Volume Action   01/19/20 1026 *Not included in total MAR Hold    Warfarin - Pharmacist Dosing Inpatient Total dose:  Cannot be calculated* Dosing weight:  136.1  *Administration dose not documented Date/Time Rate/Dose/Volume Action   01/19/20 1026 *Not included in total MAR Hold    Sedation Time  Sedation Time Physician-1: 28 minutes 27 seconds   Contrast  Medication Name Total Dose  iohexol (OMNIPAQUE) 350 MG/ML injection 50 mL    Radiation/Fluoro  Fluoro time: 4.5 (min) DAP: 37943 (mGycm2) Cumulative Air Kerma: 505 (mGy)   Coronary Findings   Diagnostic Dominance: Right  No diagnostic findings have been documented.  Intervention   No interventions have been documented.  Right Heart  Right Heart Pressures Hemodynamic findings consistent with aortic valve stenosis. Right atrial pressure-11/11 Right ventricular pressure-43/7 Pulmonary artery pressure-42/14, mean 30 Pulmonary wedge pressure-A-wave 22, V wave 24, mean 18 LVEDP-20 Cardiac output-12.1 L/min with an index of 5 L/min/m by Fick Aortic valve gradient peak to peak 58 mmHg Aortic valve area 1.46 cm with an index of 0.61 cm/m   Coronary Diagrams   Diagnostic Dominance: Right    Intervention    Implants       No implant documentation for this case.   Syngo Images  Show images for CARDIAC CATHETERIZATION  Images on Long Term Storage  Show images for Kynedi, Profitt to Procedure Log  Procedure Log     Hemo Data  Flowsheet Row Most Recent Value  Fick Cardiac Output 12.1 L/min  Fick Cardiac Output Index 5.04 (L/min)/BSA  Aortic Mean Gradient 52.14 mmHg  Aortic Peak Gradient 58 mmHg  Aortic Valve Area 1.46  Aortic Value Area Index 0.61 cm2/BSA  RA A Wave 11 mmHg  RA V Wave 11 mmHg  RA Mean 8 mmHg  RV Systolic Pressure 43 mmHg  RV Diastolic Pressure 7 mmHg  RV EDP 10 mmHg  PA Systolic Pressure 42 mmHg  PA Diastolic Pressure 14 mmHg  PA Mean 30 mmHg  PW A Wave 22 mmHg  PW V Wave 24 mmHg  PW Mean 18 mmHg  AO Systolic Pressure 127 mmHg  AO Diastolic Pressure 84 mmHg  AO Mean 103 mmHg  LV Systolic Pressure 191 mmHg  LV Diastolic Pressure 6 mmHg  LV EDP 20 mmHg  AOp Systolic Pressure 131 mmHg  AOp Diastolic Pressure 81 mmHg  AOp Mean Pressure 104 mmHg  LVp Systolic Pressure 189 mmHg  LVp Diastolic Pressure 2 mmHg  LVp EDP Pressure 18 mmHg  QP/QS 1  TPVR Index 5.95 HRUI  TSVR Index 20.44 HRUI  PVR SVR Ratio 0.13  TPVR/TSVR Ratio 0.29    ADDENDUM REPORT: 01/20/2020 14:32  CLINICAL DATA: Severe Aortic Stenosis.  EXAM: Cardiac TAVR CT  TECHNIQUE: The patient was scanned on a [employer]. A 120 kV retrospective scan was triggered in the descending thoracic aorta at 111 HU's. Gantry rotation speed was 250 msecs and collimation was .6 mm. No beta blockade or nitro were given. The 3D data set was reconstructed in 5% intervals of the R-R cycle. Systolic and diastolic phases were analyzed on a dedicated work station using MPR, MIP and VRT modes. The patient  received 80 cc of contrast.  FINDINGS: Image quality: Average.  Noise artifact is: Significant signal to noise artifact (BMI 52).  Valve Morphology: The aortic valve is tricuspid. There is severe bulky calcification of the RCC and this leaflet is immobile. There is moderate calcification of the NCC and LCC.  Aortic Valve Calcium score:  1789  Aortic annular dimension:  Phase assessed: 35%  Annular area: 486 mm2  Annular perimeter: 80.7 mm  Max diameter: 28.8 mm  Min diameter: 22.0 mm  Annular and subannular calcification: None.  Optimal coplanar projection: LAO 17 CAU 1  Coronary Artery Height above Annulus:  Left Main: 16.3 mm  Right Coronary: 20.2 mm  Sinus of Valsalva Measurements:  Non-coronary: 30 mm  Right-coronary: 28 mm  Left-coronary: 30 mm  Sinus of Valsalva Height:  Non-coronary: 21.1 mm  Right-coronary: 25.3 mm  Left-coronary: 22.1 mm  Sinotubular Junction: 29 mm  Ascending Thoracic Aorta: 32 mm  Coronary Arteries: Normal coronary origin. Right dominance. The study was performed without use of NTG and is insufficient for plaque evaluation. Please refer to recent cardiac catheterization for coronary assessment.  Cardiac Morphology:  Right Atrium: Right atrial size is within normal limits.  Right Ventricle: The right ventricular cavity is within normal limits.  Left Atrium: Left atrial size is normal in size with no left atrial appendage filling defect.  Left Ventricle: The ventricular cavity size is within normal limits. There are no stigmata of prior infarction. There is no abnormal filling defect. Normal left ventricular function, 73%. No regional wall motion abnormalities.  Pulmonary arteries: Normal in size without proximal filling defect.  Pulmonary veins: Normal pulmonary venous drainage.  Pericardium: Normal thickness with no significant effusion or calcium present.  Mitral Valve: The mitral valve is normal structure with mild mitral annular calcification.  Extra-cardiac findings: See attached radiology report for non-cardiac structures.  IMPRESSION: 1. Difficult study due to obesity artifact (BMI 52).  2. Tricuspid aortic valve with severe aortic stenosis (aortic valve calcium score 1789).  3. Annular measurements  appropriate for 26 mm Edwards S3 TAVR (486 mm2).  4. No significant annular or subannular calcifications.  5. Sufficient coronary to annulus distance.  6. Optimal Fluoroscopic Angle for Delivery: LAO 17 CAU 1  Cuney T. Flora Lipps, MD   Electronically Signed By: Lennie Odor On: 01/20/2020 14:32  Impression:  This 55 year old woman has stage D, severe, symptomatic aortic stenosis with New York Heart Association class II symptoms of exertional fatigue and shortness of breath consistent with chronic diastolic congestive heart failure who recently presented with recurrent left chest discomfort.  I have personally reviewed her 2D echocardiogram, cardiac catheterization, and CTA studies.  Her echocardiogram shows a trileaflet aortic valve with heavy calcification and restricted mobility.  The mean gradient is 46 mmHg consistent with severe aortic stenosis.  Left ventricular ejection fraction is normal.  Cardiac catheterization confirmed severe aortic stenosis and coronary angiography shows no coronary disease.  I agree that aortic valve replacement is indicated in this patient for relief of her symptoms and to prevent progressive left ventricular deterioration.  Given her young age I think open surgical aortic valve replacement is the best option for her.  I did discuss transcatheter aortic valve replacement with her and my recommendation not to perform that due to her young age and the high risk of structural valve deterioration.  She does have multiple comorbid risk factors including morbid obesity but is still active and I think she will make a good recovery following aortic  valve replacement.  I have recommended that we use a bioprosthetic valve to avoid the need for lifelong anticoagulation with Coumadin.  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  She is in agreement with that. I discussed the operative procedure with the patient including alternatives, benefits and risks; including but not limited to bleeding, blood transfusion, infection, stroke, myocardial infarction, graft failure, heart block requiring a permanent pacemaker, organ dysfunction, and death.  [name] understands and agrees to proceed.  She would like to think about this further and discuss it with her mother to make arrangements for postoperative care.  I discussed the possibility of using a Lovenox bridge while we stop her Coumadin for 5 days preoperatively.  She said that she has had significant recurrent rash with Lovenox and is now listed as an allergy.  I think it should be fine to stop her Coumadin 5 days preoperatively.   Plan:  Aortic valve replacement using a bioprosthetic valve.     Alleen Borne, MD

## 2020-04-05 NOTE — Progress Notes (Signed)
Your procedure is scheduled on April 06, 2020 Friday Report to [facility] Main Entrance "A" at 05:30 A.M., then check in with the Admitting office.  Call this number if you have problems the morning of surgery: [phone]   If you have any questions prior to your surgery date call [phone]: Open Monday-Friday 8am-4pm    Remember:  Do not eat or drink after midnight the night before your surgery     Take these medicines the morning of surgery with A SIP OF WATER  labetalol (NORMODYNE)  levothyroxine (SYNTHROID) omeprazole (PRILOSEC)  PARoxetine (PAXIL) rosuvastatin (CRESTOR)  As of today, STOP taking any Aspirin (unless otherwise instructed by your surgeon) Aleve, Naproxen, Ibuprofen, Motrin, Advil, Goody's, BC's, all herbal medications, fish oil, and all vitamins.   Please stop warfarin (COUMADIN) 5 days prior to surgery. Last dose will be 03/31/20.                     Do not wear jewelry, make up, or nail polish            Do not wear lotions, powders, perfumes, or deodorant.            Do not shave 48 hours prior to surgery.              Do not bring valuables to the hospital.            [facility] is not responsible for any belongings or valuables.  Do NOT Smoke (Tobacco/Vaping) or drink Alcohol 24 hours prior to your procedure If you use a CPAP at night, you may bring all equipment for your overnight stay.   Contacts, glasses, dentures or bridgework may not be worn into surgery, please bring cases for these belongings   For patients admitted to the hospital, discharge time will be determined by your treatment team.   Patients discharged the day of surgery will not be allowed to drive home, and someone needs to stay with them for 24 hours.    Special instructions:   [place]- Preparing For Surgery  Before surgery, you can play an important role. Because skin is not sterile, your skin needs to be as free of germs as possible. You can reduce the number of germs  on your skin by washing with CHG (chlorahexidine gluconate) Soap before surgery.  CHG is an antiseptic cleaner which kills germs and bonds with the skin to continue killing germs even after washing.    Oral Hygiene is also important to reduce your risk of infection.  Remember - BRUSH YOUR TEETH THE MORNING OF SURGERY WITH YOUR REGULAR TOOTHPASTE  Please do not use if you have an allergy to CHG or antibacterial soaps. If your skin becomes reddened/irritated stop using the CHG.  Do not shave (including legs and underarms) for at least 48 hours prior to first CHG shower. It is OK to shave your face.  Please follow these instructions carefully.   1. Shower the NIGHT BEFORE SURGERY and the MORNING OF SURGERY  2. If you chose to wash your hair, wash your hair first as usual with your normal shampoo.  3. After you shampoo, rinse your hair and body thoroughly to remove the shampoo.  4. Wash Face and genitals (private parts) with your normal soap.   5. Use CHG Soap as you would any other liquid soap. You can apply CHG directly to the skin and wash gently with a scrungie or a clean washcloth.   6.  Apply the CHG Soap to your body ONLY FROM THE NECK DOWN.  Do not use on open wounds or open sores. Avoid contact with your eyes, ears, mouth and genitals (private parts). Wash Face and genitals (private parts)  with your normal soap.   7. Wash thoroughly, paying special attention to the area where your surgery will be performed.  8. Thoroughly rinse your body with warm water from the neck down.  9. DO NOT shower/wash with your normal soap after using and rinsing off the CHG Soap.  10. Pat yourself dry with a CLEAN TOWEL.  11. Wear CLEAN PAJAMAS to bed the night before surgery  12. Place CLEAN SHEETS on your bed the night before your surgery  13. DO NOT SLEEP WITH PETS.   Day of Surgery: Take a shower with CHG soap that was provided Wear Clean/Comfortable clothing the morning of surgery Do not  apply any deodorants/lotions.   Remember to brush your teeth WITH YOUR REGULAR TOOTHPASTE.   Please read over the following fact sheets that you were given.

## 2020-04-06 ENCOUNTER — Inpatient Hospital Stay (HOSPITAL_COMMUNITY): Payer: 59

## 2020-04-06 ENCOUNTER — Inpatient Hospital Stay (HOSPITAL_COMMUNITY): Payer: 59 | Admitting: Physician Assistant

## 2020-04-06 ENCOUNTER — Encounter (HOSPITAL_COMMUNITY)
Admission: RE | Disposition: A | Discharge status: Home / Self Care | Payer: Self-pay | Source: Home / Self Care | Attending: Surgery

## 2020-04-06 ENCOUNTER — Inpatient Hospital Stay (HOSPITAL_COMMUNITY)
Admission: RE | Admit: 2020-04-06 | Discharge: 2020-04-11 | DRG: 220 | Disposition: A | Discharge status: Home / Self Care | Payer: 59 | Attending: Surgery | Admitting: Surgery

## 2020-04-06 ENCOUNTER — Encounter (HOSPITAL_COMMUNITY): Payer: Self-pay | Admitting: Surgery

## 2020-04-06 DIAGNOSIS — D6959 Other secondary thrombocytopenia: Secondary | ICD-10-CM | POA: Diagnosis not present

## 2020-04-06 DIAGNOSIS — Z20822 Contact with and (suspected) exposure to covid-19: Secondary | ICD-10-CM | POA: Diagnosis present

## 2020-04-06 DIAGNOSIS — Z9884 Bariatric surgery status: Secondary | ICD-10-CM

## 2020-04-06 DIAGNOSIS — Z953 Presence of xenogenic heart valve: Secondary | ICD-10-CM

## 2020-04-06 DIAGNOSIS — I5032 Chronic diastolic (congestive) heart failure: Secondary | ICD-10-CM | POA: Diagnosis present

## 2020-04-06 DIAGNOSIS — Z7901 Long term (current) use of anticoagulants: Secondary | ICD-10-CM

## 2020-04-06 DIAGNOSIS — I472 Ventricular tachycardia: Secondary | ICD-10-CM | POA: Diagnosis not present

## 2020-04-06 DIAGNOSIS — K219 Gastro-esophageal reflux disease without esophagitis: Secondary | ICD-10-CM | POA: Diagnosis present

## 2020-04-06 DIAGNOSIS — I350 Nonrheumatic aortic (valve) stenosis: Principal | ICD-10-CM | POA: Diagnosis present

## 2020-04-06 DIAGNOSIS — Z86711 Personal history of pulmonary embolism: Secondary | ICD-10-CM | POA: Diagnosis not present

## 2020-04-06 DIAGNOSIS — Z87891 Personal history of nicotine dependence: Secondary | ICD-10-CM

## 2020-04-06 DIAGNOSIS — I4891 Unspecified atrial fibrillation: Secondary | ICD-10-CM | POA: Diagnosis not present

## 2020-04-06 DIAGNOSIS — Z86718 Personal history of other venous thrombosis and embolism: Secondary | ICD-10-CM | POA: Diagnosis not present

## 2020-04-06 DIAGNOSIS — E785 Hyperlipidemia, unspecified: Secondary | ICD-10-CM | POA: Diagnosis present

## 2020-04-06 DIAGNOSIS — E038 Other specified hypothyroidism: Secondary | ICD-10-CM | POA: Diagnosis present

## 2020-04-06 DIAGNOSIS — I4892 Unspecified atrial flutter: Secondary | ICD-10-CM | POA: Diagnosis not present

## 2020-04-06 DIAGNOSIS — I110 Hypertensive heart disease with heart failure: Secondary | ICD-10-CM | POA: Diagnosis present

## 2020-04-06 DIAGNOSIS — Z8249 Family history of ischemic heart disease and other diseases of the circulatory system: Secondary | ICD-10-CM | POA: Diagnosis not present

## 2020-04-06 DIAGNOSIS — Z6841 Body Mass Index (BMI) 40.0 and over, adult: Secondary | ICD-10-CM | POA: Diagnosis not present

## 2020-04-06 DIAGNOSIS — D62 Acute posthemorrhagic anemia: Secondary | ICD-10-CM | POA: Diagnosis not present

## 2020-04-06 DIAGNOSIS — Z79899 Other long term (current) drug therapy: Secondary | ICD-10-CM | POA: Diagnosis not present

## 2020-04-06 DIAGNOSIS — J939 Pneumothorax, unspecified: Secondary | ICD-10-CM

## 2020-04-06 DIAGNOSIS — Z952 Presence of prosthetic heart valve: Secondary | ICD-10-CM

## 2020-04-06 DIAGNOSIS — Z79890 Hormone replacement therapy (postmenopausal): Secondary | ICD-10-CM | POA: Diagnosis not present

## 2020-04-06 DIAGNOSIS — G540 Brachial plexus disorders: Secondary | ICD-10-CM | POA: Diagnosis present

## 2020-04-06 DIAGNOSIS — G4730 Sleep apnea, unspecified: Secondary | ICD-10-CM | POA: Diagnosis present

## 2020-04-06 DIAGNOSIS — M329 Systemic lupus erythematosus, unspecified: Secondary | ICD-10-CM | POA: Diagnosis present

## 2020-04-06 DIAGNOSIS — D509 Iron deficiency anemia, unspecified: Secondary | ICD-10-CM | POA: Diagnosis present

## 2020-04-06 DIAGNOSIS — I2510 Atherosclerotic heart disease of native coronary artery without angina pectoris: Secondary | ICD-10-CM | POA: Diagnosis present

## 2020-04-06 DIAGNOSIS — D6851 Activated protein C resistance: Secondary | ICD-10-CM | POA: Diagnosis present

## 2020-04-06 DIAGNOSIS — F410 Panic disorder [episodic paroxysmal anxiety] without agoraphobia: Secondary | ICD-10-CM | POA: Diagnosis present

## 2020-04-06 HISTORY — PX: AORTIC VALVE REPLACEMENT: SHX41

## 2020-04-06 HISTORY — PX: TEE WITHOUT CARDIOVERSION: SHX5443

## 2020-04-06 LAB — CBC
HCT: 31.5 % — ABNORMAL LOW (ref 36.0–46.0)
HCT: 31.2 % — ABNORMAL LOW (ref 36.0–46.0)
Hemoglobin: 8.8 g/dL — ABNORMAL LOW (ref 12.0–15.0)
Hemoglobin: 9.4 g/dL — ABNORMAL LOW (ref 12.0–15.0)
MCH: 22.7 pg — ABNORMAL LOW (ref 26.0–34.0)
MCH: 24.4 pg — ABNORMAL LOW (ref 26.0–34.0)
MCHC: 27.9 g/dL — ABNORMAL LOW (ref 30.0–36.0)
MCHC: 30.1 g/dL (ref 30.0–36.0)
MCV: 81.2 fL (ref 80.0–100.0)
MCV: 80.8 fL (ref 80.0–100.0)
Platelets: 121 10*3/uL — ABNORMAL LOW (ref 150–400)
Platelets: 116 10*3/uL — ABNORMAL LOW (ref 150–400)
RBC: 3.88 MIL/uL (ref 3.87–5.11)
RBC: 3.86 MIL/uL — ABNORMAL LOW (ref 3.87–5.11)
RDW: 16.4 % — ABNORMAL HIGH (ref 11.5–15.5)
RDW: 16.6 % — ABNORMAL HIGH (ref 11.5–15.5)
WBC: 7.2 10*3/uL (ref 4.0–10.5)
WBC: 7.5 10*3/uL (ref 4.0–10.5)
nRBC: 0 % (ref 0.0–0.2)
nRBC: 0 % (ref 0.0–0.2)

## 2020-04-06 LAB — POCT I-STAT 7, (LYTES, BLD GAS, ICA,H+H)
Acid-Base Excess: 4 mmol/L — ABNORMAL HIGH (ref 0.0–2.0)
Acid-Base Excess: 2 mmol/L (ref 0.0–2.0)
Acid-Base Excess: 3 mmol/L — ABNORMAL HIGH (ref 0.0–2.0)
Acid-Base Excess: 3 mmol/L — ABNORMAL HIGH (ref 0.0–2.0)
Acid-Base Excess: 3 mmol/L — ABNORMAL HIGH (ref 0.0–2.0)
Acid-Base Excess: 0 mmol/L (ref 0.0–2.0)
Acid-Base Excess: 0 mmol/L (ref 0.0–2.0)
Acid-base deficit: 2 mmol/L (ref 0.0–2.0)
Bicarbonate: 30.9 mmol/L — ABNORMAL HIGH (ref 20.0–28.0)
Bicarbonate: 27.8 mmol/L (ref 20.0–28.0)
Bicarbonate: 28.7 mmol/L — ABNORMAL HIGH (ref 20.0–28.0)
Bicarbonate: 27.1 mmol/L (ref 20.0–28.0)
Bicarbonate: 28.1 mmol/L — ABNORMAL HIGH (ref 20.0–28.0)
Bicarbonate: 26.8 mmol/L (ref 20.0–28.0)
Bicarbonate: 24.8 mmol/L (ref 20.0–28.0)
Bicarbonate: 27 mmol/L (ref 20.0–28.0)
Calcium, Ion: 1.29 mmol/L (ref 1.15–1.40)
Calcium, Ion: 1.11 mmol/L — ABNORMAL LOW (ref 1.15–1.40)
Calcium, Ion: 1.15 mmol/L (ref 1.15–1.40)
Calcium, Ion: 1.15 mmol/L (ref 1.15–1.40)
Calcium, Ion: 1.17 mmol/L (ref 1.15–1.40)
Calcium, Ion: 1.19 mmol/L (ref 1.15–1.40)
Calcium, Ion: 1.22 mmol/L (ref 1.15–1.40)
Calcium, Ion: 1.24 mmol/L (ref 1.15–1.40)
HCT: 32 % — ABNORMAL LOW (ref 36.0–46.0)
HCT: 23 % — ABNORMAL LOW (ref 36.0–46.0)
HCT: 25 % — ABNORMAL LOW (ref 36.0–46.0)
HCT: 22 % — ABNORMAL LOW (ref 36.0–46.0)
HCT: 25 % — ABNORMAL LOW (ref 36.0–46.0)
HCT: 27 % — ABNORMAL LOW (ref 36.0–46.0)
HCT: 27 % — ABNORMAL LOW (ref 36.0–46.0)
HCT: 29 % — ABNORMAL LOW (ref 36.0–46.0)
Hemoglobin: 10.9 g/dL — ABNORMAL LOW (ref 12.0–15.0)
Hemoglobin: 7.8 g/dL — ABNORMAL LOW (ref 12.0–15.0)
Hemoglobin: 8.5 g/dL — ABNORMAL LOW (ref 12.0–15.0)
Hemoglobin: 7.5 g/dL — ABNORMAL LOW (ref 12.0–15.0)
Hemoglobin: 8.5 g/dL — ABNORMAL LOW (ref 12.0–15.0)
Hemoglobin: 9.2 g/dL — ABNORMAL LOW (ref 12.0–15.0)
Hemoglobin: 9.2 g/dL — ABNORMAL LOW (ref 12.0–15.0)
Hemoglobin: 9.9 g/dL — ABNORMAL LOW (ref 12.0–15.0)
O2 Saturation: 100 %
O2 Saturation: 100 %
O2 Saturation: 78 %
O2 Saturation: 100 %
O2 Saturation: 100 %
O2 Saturation: 98 %
O2 Saturation: 97 %
O2 Saturation: 99 %
Patient temperature: 35.6
Patient temperature: 36.1
Patient temperature: 36.4
Potassium: 4.7 mmol/L (ref 3.5–5.1)
Potassium: 4.4 mmol/L (ref 3.5–5.1)
Potassium: 4.5 mmol/L (ref 3.5–5.1)
Potassium: 4.9 mmol/L (ref 3.5–5.1)
Potassium: 4.9 mmol/L (ref 3.5–5.1)
Potassium: 4.6 mmol/L (ref 3.5–5.1)
Potassium: 4.9 mmol/L (ref 3.5–5.1)
Potassium: 5.2 mmol/L — ABNORMAL HIGH (ref 3.5–5.1)
Sodium: 141 mmol/L (ref 135–145)
Sodium: 141 mmol/L (ref 135–145)
Sodium: 142 mmol/L (ref 135–145)
Sodium: 139 mmol/L (ref 135–145)
Sodium: 141 mmol/L (ref 135–145)
Sodium: 142 mmol/L (ref 135–145)
Sodium: 142 mmol/L (ref 135–145)
Sodium: 141 mmol/L (ref 135–145)
TCO2: 33 mmol/L — ABNORMAL HIGH (ref 22–32)
TCO2: 29 mmol/L (ref 22–32)
TCO2: 30 mmol/L (ref 22–32)
TCO2: 28 mmol/L (ref 22–32)
TCO2: 30 mmol/L (ref 22–32)
TCO2: 28 mmol/L (ref 22–32)
TCO2: 26 mmol/L (ref 22–32)
TCO2: 29 mmol/L (ref 22–32)
pCO2 arterial: 60.4 mmHg — ABNORMAL HIGH (ref 32.0–48.0)
pCO2 arterial: 46.6 mmHg (ref 32.0–48.0)
pCO2 arterial: 49.4 mmHg — ABNORMAL HIGH (ref 32.0–48.0)
pCO2 arterial: 40.1 mmHg (ref 32.0–48.0)
pCO2 arterial: 48.3 mmHg — ABNORMAL HIGH (ref 32.0–48.0)
pCO2 arterial: 50.7 mmHg — ABNORMAL HIGH (ref 32.0–48.0)
pCO2 arterial: 51.6 mmHg — ABNORMAL HIGH (ref 32.0–48.0)
pCO2 arterial: 51 mmHg — ABNORMAL HIGH (ref 32.0–48.0)
pH, Arterial: 7.318 — ABNORMAL LOW (ref 7.350–7.450)
pH, Arterial: 7.357 (ref 7.350–7.450)
pH, Arterial: 7.398 (ref 7.350–7.450)
pH, Arterial: 7.374 (ref 7.350–7.450)
pH, Arterial: 7.438 (ref 7.350–7.450)
pH, Arterial: 7.324 — ABNORMAL LOW (ref 7.350–7.450)
pH, Arterial: 7.284 — ABNORMAL LOW (ref 7.350–7.450)
pH, Arterial: 7.329 — ABNORMAL LOW (ref 7.350–7.450)
pO2, Arterial: 407 mmHg — ABNORMAL HIGH (ref 83.0–108.0)
pO2, Arterial: 371 mmHg — ABNORMAL HIGH (ref 83.0–108.0)
pO2, Arterial: 45 mmHg — ABNORMAL LOW (ref 83.0–108.0)
pO2, Arterial: 393 mmHg — ABNORMAL HIGH (ref 83.0–108.0)
pO2, Arterial: 405 mmHg — ABNORMAL HIGH (ref 83.0–108.0)
pO2, Arterial: 109 mmHg — ABNORMAL HIGH (ref 83.0–108.0)
pO2, Arterial: 94 mmHg (ref 83.0–108.0)
pO2, Arterial: 129 mmHg — ABNORMAL HIGH (ref 83.0–108.0)

## 2020-04-06 LAB — POCT I-STAT, CHEM 8
BUN: 12 mg/dL (ref 6–20)
BUN: 13 mg/dL (ref 6–20)
BUN: 11 mg/dL (ref 6–20)
BUN: 12 mg/dL (ref 6–20)
BUN: 13 mg/dL (ref 6–20)
Calcium, Ion: 1.26 mmol/L (ref 1.15–1.40)
Calcium, Ion: 1.32 mmol/L (ref 1.15–1.40)
Calcium, Ion: 1.18 mmol/L (ref 1.15–1.40)
Calcium, Ion: 1.17 mmol/L (ref 1.15–1.40)
Calcium, Ion: 1.2 mmol/L (ref 1.15–1.40)
Chloride: 104 mmol/L (ref 98–111)
Chloride: 106 mmol/L (ref 98–111)
Chloride: 106 mmol/L (ref 98–111)
Chloride: 105 mmol/L (ref 98–111)
Chloride: 105 mmol/L (ref 98–111)
Creatinine, Ser: 0.9 mg/dL (ref 0.44–1.00)
Creatinine, Ser: 1 mg/dL (ref 0.44–1.00)
Creatinine, Ser: 0.8 mg/dL (ref 0.44–1.00)
Creatinine, Ser: 0.8 mg/dL (ref 0.44–1.00)
Creatinine, Ser: 0.9 mg/dL (ref 0.44–1.00)
Glucose, Bld: 96 mg/dL (ref 70–99)
Glucose, Bld: 99 mg/dL (ref 70–99)
Glucose, Bld: 120 mg/dL — ABNORMAL HIGH (ref 70–99)
Glucose, Bld: 146 mg/dL — ABNORMAL HIGH (ref 70–99)
Glucose, Bld: 148 mg/dL — ABNORMAL HIGH (ref 70–99)
HCT: 25 % — ABNORMAL LOW (ref 36.0–46.0)
HCT: 29 % — ABNORMAL LOW (ref 36.0–46.0)
HCT: 23 % — ABNORMAL LOW (ref 36.0–46.0)
HCT: 23 % — ABNORMAL LOW (ref 36.0–46.0)
HCT: 23 % — ABNORMAL LOW (ref 36.0–46.0)
Hemoglobin: 8.5 g/dL — ABNORMAL LOW (ref 12.0–15.0)
Hemoglobin: 9.9 g/dL — ABNORMAL LOW (ref 12.0–15.0)
Hemoglobin: 7.8 g/dL — ABNORMAL LOW (ref 12.0–15.0)
Hemoglobin: 7.8 g/dL — ABNORMAL LOW (ref 12.0–15.0)
Hemoglobin: 7.8 g/dL — ABNORMAL LOW (ref 12.0–15.0)
Potassium: 4.4 mmol/L (ref 3.5–5.1)
Potassium: 4.7 mmol/L (ref 3.5–5.1)
Potassium: 4.7 mmol/L (ref 3.5–5.1)
Potassium: 4.9 mmol/L (ref 3.5–5.1)
Potassium: 5.3 mmol/L — ABNORMAL HIGH (ref 3.5–5.1)
Sodium: 140 mmol/L (ref 135–145)
Sodium: 142 mmol/L (ref 135–145)
Sodium: 140 mmol/L (ref 135–145)
Sodium: 139 mmol/L (ref 135–145)
Sodium: 140 mmol/L (ref 135–145)
TCO2: 27 mmol/L (ref 22–32)
TCO2: 30 mmol/L (ref 22–32)
TCO2: 28 mmol/L (ref 22–32)
TCO2: 26 mmol/L (ref 22–32)
TCO2: 28 mmol/L (ref 22–32)

## 2020-04-06 LAB — GLUCOSE, CAPILLARY
Glucose-Capillary: 135 mg/dL — ABNORMAL HIGH (ref 70–99)
Glucose-Capillary: 101 mg/dL — ABNORMAL HIGH (ref 70–99)
Glucose-Capillary: 112 mg/dL — ABNORMAL HIGH (ref 70–99)
Glucose-Capillary: 119 mg/dL — ABNORMAL HIGH (ref 70–99)
Glucose-Capillary: 142 mg/dL — ABNORMAL HIGH (ref 70–99)

## 2020-04-06 LAB — BASIC METABOLIC PANEL
Anion gap: 6 (ref 5–15)
BUN: 12 mg/dL (ref 6–20)
CO2: 24 mmol/L (ref 22–32)
Calcium: 8.4 mg/dL — ABNORMAL LOW (ref 8.9–10.3)
Chloride: 107 mmol/L (ref 98–111)
Creatinine, Ser: 0.92 mg/dL (ref 0.44–1.00)
GFR, Estimated: >60 mL/min (ref >60)
Glucose, Bld: 127 mg/dL — ABNORMAL HIGH (ref 70–99)
Potassium: 4.8 mmol/L (ref 3.5–5.1)
Sodium: 137 mmol/L (ref 135–145)

## 2020-04-06 LAB — HEMOGLOBIN AND HEMATOCRIT, BLOOD
HCT: 25.4 % — ABNORMAL LOW (ref 36.0–46.0)
Hemoglobin: 7.4 g/dL — ABNORMAL LOW (ref 12.0–15.0)

## 2020-04-06 LAB — PLATELET COUNT: Platelets: 137 10*3/uL — ABNORMAL LOW (ref 150–400)

## 2020-04-06 LAB — MAGNESIUM: Magnesium: 2.7 mg/dL — ABNORMAL HIGH (ref 1.7–2.4)

## 2020-04-06 LAB — PROTIME-INR
INR: 1.4 — ABNORMAL HIGH (ref 0.8–1.2)
Prothrombin Time: 16.6 seconds — ABNORMAL HIGH (ref 11.4–15.2)

## 2020-04-06 LAB — APTT: aPTT: 27 seconds (ref 24–36)

## 2020-04-06 SURGERY — REPLACEMENT, AORTIC VALVE, OPEN
Anesthesia: General | Site: Chest

## 2020-04-06 MED ORDER — SODIUM CHLORIDE 0.9 % IV SOLN: INTRAVENOUS | Status: DC

## 2020-04-06 MED ORDER — SODIUM CHLORIDE 0.9% FLUSH: 3.0000 mL | INTRAVENOUS | Status: DC | PRN

## 2020-04-06 MED ORDER — PROTAMINE SULFATE 10 MG/ML IV SOLN
INTRAVENOUS | Status: AC
  Filled 2020-04-06: qty 50

## 2020-04-06 MED ORDER — FENTANYL CITRATE (PF) 250 MCG/5ML IJ SOLN
INTRAMUSCULAR | Status: AC
  Filled 2020-04-06: qty 25

## 2020-04-06 MED ORDER — THROMBIN 20000 UNITS EX SOLR
OROMUCOSAL | Status: DC | PRN
  Administered 2020-04-06 (×3): 4 mL via TOPICAL

## 2020-04-06 MED ORDER — THROMBIN 20000 UNITS EX SOLR
CUTANEOUS | Status: DC | PRN
  Administered 2020-04-06: 20000 [IU] via TOPICAL

## 2020-04-06 MED ORDER — METOPROLOL TARTRATE 5 MG/5ML IV SOLN: 2.5000 mg | INTRAVENOUS | Status: DC | PRN

## 2020-04-06 MED ORDER — ONDANSETRON HCL 4 MG/2ML IJ SOLN: 4.0000 mg | Freq: Four times a day (QID) | INTRAMUSCULAR | Status: DC | PRN

## 2020-04-06 MED ORDER — METOPROLOL TARTRATE 12.5 MG HALF TABLET: 12.5000 mg | ORAL_TABLET | Freq: Once | ORAL | Status: DC

## 2020-04-06 MED ORDER — NITROGLYCERIN IN D5W 200-5 MCG/ML-% IV SOLN: 0.0000 ug/min | INTRAVENOUS | Status: DC

## 2020-04-06 MED ORDER — ASPIRIN EC 325 MG PO TBEC
325.0000 mg | DELAYED_RELEASE_TABLET | Freq: Every day | ORAL | Status: DC
  Administered 2020-04-07: 325 mg via ORAL
  Filled 2020-04-06: qty 1

## 2020-04-06 MED ORDER — MIDAZOLAM HCL 2 MG/2ML IJ SOLN: 2.0000 mg | INTRAMUSCULAR | Status: DC | PRN

## 2020-04-06 MED ORDER — INSULIN ASPART 100 UNIT/ML ~~LOC~~ SOLN
0.0000 [IU] | SUBCUTANEOUS | Status: DC
  Administered 2020-04-06 – 2020-04-08 (×5): 2 [IU] via SUBCUTANEOUS

## 2020-04-06 MED ORDER — PLASMA-LYTE 148 IV SOLN
INTRAVENOUS | Status: DC | PRN
  Administered 2020-04-06: 500 mL via INTRAVASCULAR

## 2020-04-06 MED ORDER — BARIATRIC MULTIVITAMINS/IRON PO CAPS: 1.0000 | ORAL_CAPSULE | Freq: Every day | ORAL | Status: DC

## 2020-04-06 MED ORDER — HEPARIN SODIUM (PORCINE) 1000 UNIT/ML IJ SOLN
INTRAMUSCULAR | Status: AC
  Filled 2020-04-06: qty 1

## 2020-04-06 MED ORDER — PROPOFOL 10 MG/ML IV BOLUS
INTRAVENOUS | Status: DC | PRN
Start: 2020-04-06 — End: 2020-04-06
  Administered 2020-04-06: 50 mg via INTRAVENOUS
  Administered 2020-04-06: 40 mg via INTRAVENOUS
  Administered 2020-04-06: 60 mg via INTRAVENOUS
  Administered 2020-04-06: 20 mg via INTRAVENOUS

## 2020-04-06 MED ORDER — ASPIRIN 81 MG PO CHEW: 324.0000 mg | CHEWABLE_TABLET | Freq: Every day | ORAL | Status: DC

## 2020-04-06 MED ORDER — LACTATED RINGERS IV SOLN: INTRAVENOUS | Status: DC

## 2020-04-06 MED ORDER — SUCCINYLCHOLINE CHLORIDE 200 MG/10ML IV SOSY
PREFILLED_SYRINGE | INTRAVENOUS | Status: AC
  Filled 2020-04-06: qty 10

## 2020-04-06 MED ORDER — PANTOPRAZOLE SODIUM 40 MG PO TBEC
40.0000 mg | DELAYED_RELEASE_TABLET | Freq: Every day | ORAL | Status: DC
  Administered 2020-04-08 – 2020-04-11 (×4): 40 mg via ORAL
  Filled 2020-04-06 (×4): qty 1

## 2020-04-06 MED ORDER — LACTATED RINGERS IV SOLN: INTRAVENOUS | Status: DC | PRN

## 2020-04-06 MED ORDER — FENTANYL CITRATE (PF) 250 MCG/5ML IJ SOLN
INTRAMUSCULAR | Status: DC | PRN
  Administered 2020-04-06: 150 ug via INTRAVENOUS
  Administered 2020-04-06: 250 ug via INTRAVENOUS
  Administered 2020-04-06: 50 ug via INTRAVENOUS
  Administered 2020-04-06 (×2): 100 ug via INTRAVENOUS
  Administered 2020-04-06: 50 ug via INTRAVENOUS
  Administered 2020-04-06: 100 ug via INTRAVENOUS
  Administered 2020-04-06 (×2): 50 ug via INTRAVENOUS
  Administered 2020-04-06 (×2): 100 ug via INTRAVENOUS
  Administered 2020-04-06: 150 ug via INTRAVENOUS

## 2020-04-06 MED ORDER — OXYCODONE HCL 5 MG PO TABS
5.0000 mg | ORAL_TABLET | ORAL | Status: DC | PRN
  Administered 2020-04-07 – 2020-04-08 (×2): 5 mg via ORAL
  Administered 2020-04-08: 10 mg via ORAL
  Administered 2020-04-08: 5 mg via ORAL
  Filled 2020-04-06: qty 2
  Filled 2020-04-06 (×3): qty 1

## 2020-04-06 MED ORDER — SODIUM CHLORIDE 0.9% FLUSH
3.0000 mL | Freq: Two times a day (BID) | INTRAVENOUS | Status: DC
  Administered 2020-04-07: 10 mL via INTRAVENOUS
  Administered 2020-04-07 – 2020-04-08 (×2): 3 mL via INTRAVENOUS

## 2020-04-06 MED ORDER — DEXMEDETOMIDINE HCL IN NACL 400 MCG/100ML IV SOLN
INTRAVENOUS | Status: DC | PRN
  Administered 2020-04-06: .7 ug/kg/h via INTRAVENOUS

## 2020-04-06 MED ORDER — MIDAZOLAM HCL (PF) 10 MG/2ML IJ SOLN
INTRAMUSCULAR | Status: AC
  Filled 2020-04-06: qty 2

## 2020-04-06 MED ORDER — METOPROLOL TARTRATE 12.5 MG HALF TABLET
ORAL_TABLET | ORAL | Status: AC
  Filled 2020-04-06: qty 1

## 2020-04-06 MED ORDER — ACETAMINOPHEN 160 MG/5ML PO SOLN: 1000.0000 mg | Freq: Four times a day (QID) | ORAL | Status: DC

## 2020-04-06 MED ORDER — ARTIFICIAL TEARS OPHTHALMIC OINT
TOPICAL_OINTMENT | OPHTHALMIC | Status: DC | PRN
  Administered 2020-04-06: 1 via OPHTHALMIC

## 2020-04-06 MED ORDER — NITROGLYCERIN IN D5W 200-5 MCG/ML-% IV SOLN
INTRAVENOUS | Status: DC | PRN
  Administered 2020-04-06: 16.6 ug/min via INTRAVENOUS

## 2020-04-06 MED ORDER — LACTATED RINGERS IV SOLN: 500.0000 mL | Freq: Once | INTRAVENOUS | Status: DC | PRN

## 2020-04-06 MED ORDER — PAROXETINE HCL 20 MG PO TABS
40.0000 mg | ORAL_TABLET | ORAL | Status: DC
  Administered 2020-04-07 – 2020-04-11 (×5): 40 mg via ORAL
  Filled 2020-04-06 (×5): qty 2

## 2020-04-06 MED ORDER — MIDAZOLAM HCL 5 MG/5ML IJ SOLN
INTRAMUSCULAR | Status: DC | PRN
  Administered 2020-04-06: 2 mg via INTRAVENOUS
  Administered 2020-04-06: 1 mg via INTRAVENOUS
  Administered 2020-04-06: 2 mg via INTRAVENOUS
  Administered 2020-04-06: 1 mg via INTRAVENOUS
  Administered 2020-04-06: 2 mg via INTRAVENOUS
  Administered 2020-04-06 (×2): 1 mg via INTRAVENOUS

## 2020-04-06 MED ORDER — ROSUVASTATIN CALCIUM 20 MG PO TABS
20.0000 mg | ORAL_TABLET | Freq: Every evening | ORAL | Status: DC
  Administered 2020-04-07 – 2020-04-10 (×4): 20 mg via ORAL
  Filled 2020-04-06 (×4): qty 1

## 2020-04-06 MED ORDER — SODIUM CHLORIDE (PF) 0.9 % IJ SOLN
INTRAMUSCULAR | Status: AC
  Filled 2020-04-06: qty 10

## 2020-04-06 MED ORDER — ACETAMINOPHEN 160 MG/5ML PO SOLN: 650.0000 mg | Freq: Once | ORAL | Status: AC

## 2020-04-06 MED ORDER — ROCURONIUM BROMIDE 10 MG/ML (PF) SYRINGE
PREFILLED_SYRINGE | INTRAVENOUS | Status: DC | PRN
  Administered 2020-04-06 (×2): 50 mg via INTRAVENOUS
  Administered 2020-04-06: 20 mg via INTRAVENOUS
  Administered 2020-04-06 (×2): 50 mg via INTRAVENOUS

## 2020-04-06 MED ORDER — CHLORHEXIDINE GLUCONATE CLOTH 2 % EX PADS
6.0000 | MEDICATED_PAD | Freq: Every day | CUTANEOUS | Status: DC
  Administered 2020-04-06 – 2020-04-07 (×2): 6 via TOPICAL

## 2020-04-06 MED ORDER — ACETAMINOPHEN 500 MG PO TABS
1000.0000 mg | ORAL_TABLET | Freq: Four times a day (QID) | ORAL | Status: DC
  Administered 2020-04-07 – 2020-04-11 (×16): 1000 mg via ORAL
  Filled 2020-04-06 (×16): qty 2

## 2020-04-06 MED ORDER — MORPHINE SULFATE (PF) 2 MG/ML IV SOLN
1.0000 mg | INTRAVENOUS | Status: DC | PRN
  Administered 2020-04-06 – 2020-04-07 (×2): 2 mg via INTRAVENOUS
  Filled 2020-04-06: qty 2
  Filled 2020-04-06: qty 1

## 2020-04-06 MED ORDER — SUCCINYLCHOLINE CHLORIDE 200 MG/10ML IV SOSY
PREFILLED_SYRINGE | INTRAVENOUS | Status: DC | PRN
  Administered 2020-04-06: 180 mg via INTRAVENOUS

## 2020-04-06 MED ORDER — METOPROLOL TARTRATE 12.5 MG HALF TABLET: 12.5000 mg | ORAL_TABLET | Freq: Two times a day (BID) | ORAL | Status: DC

## 2020-04-06 MED ORDER — VANCOMYCIN HCL IN DEXTROSE 1-5 GM/200ML-% IV SOLN
1000.0000 mg | Freq: Once | INTRAVENOUS | Status: AC
  Administered 2020-04-06: 1000 mg via INTRAVENOUS
  Filled 2020-04-06: qty 200

## 2020-04-06 MED ORDER — FAMOTIDINE IN NACL 20-0.9 MG/50ML-% IV SOLN
20.0000 mg | Freq: Two times a day (BID) | INTRAVENOUS | Status: AC
  Administered 2020-04-06 (×2): 20 mg via INTRAVENOUS
  Filled 2020-04-06: qty 50

## 2020-04-06 MED ORDER — CHLORHEXIDINE GLUCONATE CLOTH 2 % EX PADS: 6.0000 | MEDICATED_PAD | Freq: Every day | CUTANEOUS | Status: DC

## 2020-04-06 MED ORDER — 0.9 % SODIUM CHLORIDE (POUR BTL) OPTIME
TOPICAL | Status: DC | PRN
  Administered 2020-04-06: 6000 mL

## 2020-04-06 MED ORDER — PHENYLEPHRINE HCL-NACL 20-0.9 MG/250ML-% IV SOLN
0.0000 ug/min | INTRAVENOUS | Status: DC
  Administered 2020-04-07: 20 ug/min via INTRAVENOUS
  Filled 2020-04-06: qty 250

## 2020-04-06 MED ORDER — SODIUM CHLORIDE 0.9 % IV SOLN
1.5000 g | Freq: Two times a day (BID) | INTRAVENOUS | Status: AC
  Administered 2020-04-06 – 2020-04-08 (×4): 1.5 g via INTRAVENOUS
  Filled 2020-04-06 (×4): qty 1.5

## 2020-04-06 MED ORDER — METOPROLOL TARTRATE 25 MG/10 ML ORAL SUSPENSION: 12.5000 mg | Freq: Two times a day (BID) | ORAL | Status: DC

## 2020-04-06 MED ORDER — HYDROXYCHLOROQUINE SULFATE 200 MG PO TABS
200.0000 mg | ORAL_TABLET | Freq: Two times a day (BID) | ORAL | Status: DC
  Administered 2020-04-08 – 2020-04-11 (×7): 200 mg via ORAL
  Filled 2020-04-06 (×7): qty 1

## 2020-04-06 MED ORDER — SODIUM CHLORIDE 0.9 % IV SOLN: 250.0000 mL | INTRAVENOUS | Status: DC

## 2020-04-06 MED ORDER — ARTIFICIAL TEARS OPHTHALMIC OINT
TOPICAL_OINTMENT | OPHTHALMIC | Status: AC
  Filled 2020-04-06: qty 3.5

## 2020-04-06 MED ORDER — LEVOTHYROXINE SODIUM 100 MCG PO TABS
200.0000 ug | ORAL_TABLET | Freq: Every day | ORAL | Status: DC
  Administered 2020-04-07 – 2020-04-11 (×5): 200 ug via ORAL
  Filled 2020-04-06: qty 2
  Filled 2020-04-06: qty 1
  Filled 2020-04-06: qty 2
  Filled 2020-04-06: qty 1
  Filled 2020-04-06 (×3): qty 2

## 2020-04-06 MED ORDER — SODIUM CHLORIDE 0.45 % IV SOLN: INTRAVENOUS | Status: DC | PRN

## 2020-04-06 MED ORDER — DOCUSATE SODIUM 100 MG PO CAPS
200.0000 mg | ORAL_CAPSULE | Freq: Every day | ORAL | Status: DC
  Administered 2020-04-07 – 2020-04-11 (×5): 200 mg via ORAL
  Filled 2020-04-06 (×5): qty 2

## 2020-04-06 MED ORDER — MICROFIBRILLAR COLL HEMOSTAT EX PADS
MEDICATED_PAD | CUTANEOUS | Status: DC | PRN
  Administered 2020-04-06: 1 via TOPICAL

## 2020-04-06 MED ORDER — PHENYLEPHRINE 40 MCG/ML (10ML) SYRINGE FOR IV PUSH (FOR BLOOD PRESSURE SUPPORT)
PREFILLED_SYRINGE | INTRAVENOUS | Status: AC
  Filled 2020-04-06: qty 10

## 2020-04-06 MED ORDER — PROPOFOL 10 MG/ML IV BOLUS
INTRAVENOUS | Status: AC
  Filled 2020-04-06: qty 20

## 2020-04-06 MED ORDER — THROMBIN (RECOMBINANT) 20000 UNITS EX SOLR
CUTANEOUS | Status: AC
  Filled 2020-04-06: qty 20000

## 2020-04-06 MED ORDER — CHLORHEXIDINE GLUCONATE 0.12 % MT SOLN
15.0000 mL | OROMUCOSAL | Status: AC
  Administered 2020-04-06: 15 mL via OROMUCOSAL

## 2020-04-06 MED ORDER — CHLORHEXIDINE GLUCONATE 4 % EX LIQD: 30.0000 mL | CUTANEOUS | Status: DC

## 2020-04-06 MED ORDER — TRANEXAMIC ACID 1000 MG/10ML IV SOLN
INTRAVENOUS | Status: DC | PRN
  Administered 2020-04-06 (×2): 1.5 mg/kg/h via INTRAVENOUS

## 2020-04-06 MED ORDER — INSULIN REGULAR(HUMAN) IN NACL 100-0.9 UT/100ML-% IV SOLN: INTRAVENOUS | Status: DC

## 2020-04-06 MED ORDER — SODIUM CHLORIDE 0.9 % IV SOLN
INTRAVENOUS | Status: DC | PRN
  Administered 2020-04-06: 1.5 g via INTRAVENOUS
  Administered 2020-04-06: .75 g via INTRAVENOUS

## 2020-04-06 MED ORDER — ALBUMIN HUMAN 5 % IV SOLN
250.0000 mL | INTRAVENOUS | Status: AC | PRN
  Administered 2020-04-06: 12.5 g via INTRAVENOUS

## 2020-04-06 MED ORDER — PROTAMINE SULFATE 10 MG/ML IV SOLN
INTRAVENOUS | Status: DC | PRN
  Administered 2020-04-06: 30 mg via INTRAVENOUS
  Administered 2020-04-06: 470 mg via INTRAVENOUS

## 2020-04-06 MED ORDER — DEXMEDETOMIDINE HCL IN NACL 400 MCG/100ML IV SOLN
0.0000 ug/kg/h | INTRAVENOUS | Status: DC
  Filled 2020-04-06: qty 100

## 2020-04-06 MED ORDER — HEPARIN SODIUM (PORCINE) 1000 UNIT/ML IJ SOLN
INTRAMUSCULAR | Status: DC | PRN
  Administered 2020-04-06: 50000 [IU] via INTRAVENOUS

## 2020-04-06 MED ORDER — ACETAMINOPHEN 650 MG RE SUPP
650.0000 mg | Freq: Once | RECTAL | Status: AC
  Administered 2020-04-06: 650 mg via RECTAL

## 2020-04-06 MED ORDER — CHLORHEXIDINE GLUCONATE 0.12 % MT SOLN
15.0000 mL | Freq: Once | OROMUCOSAL | Status: AC
  Administered 2020-04-07: 15 mL via OROMUCOSAL

## 2020-04-06 MED ORDER — TRAMADOL HCL 50 MG PO TABS
50.0000 mg | ORAL_TABLET | ORAL | Status: DC | PRN
  Administered 2020-04-07 – 2020-04-08 (×2): 100 mg via ORAL
  Filled 2020-04-06 (×2): qty 2

## 2020-04-06 MED ORDER — MAGNESIUM SULFATE 4 GM/100ML IV SOLN
4.0000 g | Freq: Once | INTRAVENOUS | Status: AC
  Administered 2020-04-06: 4 g via INTRAVENOUS
  Filled 2020-04-06: qty 100

## 2020-04-06 MED ORDER — DEXTROSE 50 % IV SOLN: 0.0000 mL | INTRAVENOUS | Status: DC | PRN

## 2020-04-06 MED ORDER — POTASSIUM CHLORIDE 10 MEQ/50ML IV SOLN
10.0000 meq | INTRAVENOUS | Status: AC
Start: 2020-04-06 — End: 2020-04-06

## 2020-04-06 MED ORDER — INSULIN REGULAR(HUMAN) IN NACL 100-0.9 UT/100ML-% IV SOLN
INTRAVENOUS | Status: DC | PRN
  Administered 2020-04-06: .6 [IU]/h via INTRAVENOUS

## 2020-04-06 MED ORDER — BISACODYL 10 MG RE SUPP: 10.0000 mg | Freq: Every day | RECTAL | Status: DC

## 2020-04-06 MED ORDER — TRANEXAMIC ACID 1000 MG/10ML IV SOLN
1.5000 mg/kg/h | INTRAVENOUS | Status: DC
  Filled 2020-04-06: qty 25

## 2020-04-06 MED ORDER — CHLORHEXIDINE GLUCONATE 0.12 % MT SOLN
OROMUCOSAL | Status: AC
  Administered 2020-04-06: 15 mL via OROMUCOSAL
  Filled 2020-04-06: qty 15

## 2020-04-06 MED ORDER — ROCURONIUM BROMIDE 10 MG/ML (PF) SYRINGE
PREFILLED_SYRINGE | INTRAVENOUS | Status: AC
  Filled 2020-04-06: qty 20

## 2020-04-06 MED ORDER — VANCOMYCIN HCL 1000 MG IV SOLR
INTRAVENOUS | Status: DC | PRN
  Administered 2020-04-06: 1500 mg via INTRAVENOUS

## 2020-04-06 MED ORDER — BISACODYL 5 MG PO TBEC
10.0000 mg | DELAYED_RELEASE_TABLET | Freq: Every day | ORAL | Status: DC
  Administered 2020-04-07 – 2020-04-09 (×3): 10 mg via ORAL
  Filled 2020-04-06 (×4): qty 2

## 2020-04-06 SURGICAL SUPPLY — 71 items
ADAPTER CARDIO PERF ANTE/RETRO (ADAPTER) ×3 IMPLANT
BAG DECANTER FOR FLEXI CONT (MISCELLANEOUS) ×3 IMPLANT
BLADE CLIPPER SURG (BLADE) ×3 IMPLANT
BLADE STERNUM SYSTEM 6 (BLADE) ×3 IMPLANT
BLADE SURG 15 STRL LF DISP TIS (BLADE) ×2 IMPLANT
BLADE SURG 15 STRL SS (BLADE) ×3
CANISTER SUCT 3000ML PPV (MISCELLANEOUS) ×3 IMPLANT
CANNULA ARTERIAL NVNT 3/8 24FR (CANNULA) ×3 IMPLANT
CANNULA GUNDRY RCSP 15FR (MISCELLANEOUS) ×3 IMPLANT
CATH HEART VENT LEFT (CATHETERS) ×2 IMPLANT
CATH ROBINSON RED A/P 18FR (CATHETERS) ×9 IMPLANT
CATH THORACIC 36FR (CATHETERS) ×3 IMPLANT
CATH THORACIC 36FR RT ANG (CATHETERS) ×3 IMPLANT
CNTNR URN SCR LID CUP LEK RST (MISCELLANEOUS) ×2 IMPLANT
CONT SPEC 4OZ STRL OR WHT (MISCELLANEOUS) ×3
COVER SURGICAL LIGHT HANDLE (MISCELLANEOUS) ×3 IMPLANT
DRAPE CARDIOVASCULAR INCISE (DRAPES) ×3
DRAPE SLUSH/WARMER DISC (DRAPES) ×3 IMPLANT
DRAPE SRG 135X102X78XABS (DRAPES) ×2 IMPLANT
DRESSING AQUACEL AG SP 3.5X10 (GAUZE/BANDAGES/DRESSINGS) ×2 IMPLANT
DRSG AQUACEL AG SP 3.5X10 (GAUZE/BANDAGES/DRESSINGS) ×3
DRSG COVADERM 4X14 (GAUZE/BANDAGES/DRESSINGS) ×3 IMPLANT
DRSG COVADERM 4X8 (GAUZE/BANDAGES/DRESSINGS) ×3 IMPLANT
ELECT CAUTERY BLADE 6.4 (BLADE) ×3 IMPLANT
ELECT REM PT RETURN 9FT ADLT (ELECTROSURGICAL) ×6
ELECTRODE REM PT RTRN 9FT ADLT (ELECTROSURGICAL) ×4 IMPLANT
FELT TEFLON 1X6 (MISCELLANEOUS) ×6 IMPLANT
GAUZE SPONGE 4X4 12PLY STRL (GAUZE/BANDAGES/DRESSINGS) ×3 IMPLANT
GLOVE BIO SURGEON STRL SZ 6 (GLOVE) IMPLANT
GLOVE BIO SURGEON STRL SZ 6.5 (GLOVE) IMPLANT
GLOVE BIO SURGEON STRL SZ7 (GLOVE) IMPLANT
GLOVE BIO SURGEON STRL SZ7.5 (GLOVE) IMPLANT
GLOVE TRIUMPH SURG SIZE 7.0 (KITS) ×6 IMPLANT
GOWN STRL REUS W/ TWL LRG LVL3 (GOWN DISPOSABLE) ×16 IMPLANT
GOWN STRL REUS W/ TWL XL LVL3 (GOWN DISPOSABLE) ×2 IMPLANT
GOWN STRL REUS W/TWL LRG LVL3 (GOWN DISPOSABLE) ×24
GOWN STRL REUS W/TWL XL LVL3 (GOWN DISPOSABLE) ×3
HEMOSTAT POWDER SURGIFOAM 1G (HEMOSTASIS) ×9 IMPLANT
HEMOSTAT SURGICEL 2X14 (HEMOSTASIS) ×3 IMPLANT
KIT BASIN OR (CUSTOM PROCEDURE TRAY) ×3 IMPLANT
KIT CATH CPB BARTLE (MISCELLANEOUS) ×3 IMPLANT
KIT SUCTION CATH 14FR (SUCTIONS) ×3 IMPLANT
KIT TURNOVER KIT B (KITS) ×3 IMPLANT
LINE VENT (MISCELLANEOUS) ×3 IMPLANT
NS IRRIG 1000ML POUR BTL (IV SOLUTION) ×18 IMPLANT
PACK E OPEN HEART (SUTURE) ×3 IMPLANT
PACK OPEN HEART (CUSTOM PROCEDURE TRAY) ×3 IMPLANT
PAD ARMBOARD 7.5X6 YLW CONV (MISCELLANEOUS) ×6 IMPLANT
POSITIONER HEAD DONUT 9IN (MISCELLANEOUS) ×3 IMPLANT
SET CARDIOPLEGIA MPS 5001102 (MISCELLANEOUS) ×3 IMPLANT
SUT BONE WAX W31G (SUTURE) ×3 IMPLANT
SUT EB EXC GRN/WHT 2-0 V-5 (SUTURE) ×6 IMPLANT
SUT ETHIBOND 2 0 SH (SUTURE) ×3
SUT ETHIBOND 2 0 SH 36X2 (SUTURE) ×2 IMPLANT
SUT ETHIBOND V-5 VALVE (SUTURE) ×12 IMPLANT
SUT PROLENE 3 0 SH DA (SUTURE) IMPLANT
SUT PROLENE 3 0 SH1 36 (SUTURE) ×6 IMPLANT
SUT PROLENE 4 0 RB 1 (SUTURE) ×9
SUT PROLENE 4-0 RB1 .5 CRCL 36 (SUTURE) ×6 IMPLANT
SUT STEEL 6MS V (SUTURE) IMPLANT
SUT STEEL SZ 6 DBL 3X14 BALL (SUTURE) ×9 IMPLANT
SUT VIC AB 1 CTX 36 (SUTURE) ×15
SUT VIC AB 1 CTX36XBRD ANBCTR (SUTURE) ×10 IMPLANT
SYSTEM SAHARA CHEST DRAIN ATS (WOUND CARE) ×3 IMPLANT
TOWEL GREEN STERILE (TOWEL DISPOSABLE) ×3 IMPLANT
TOWEL GREEN STERILE FF (TOWEL DISPOSABLE) ×3 IMPLANT
TRAY FOLEY SLVR 16FR TEMP STAT (SET/KITS/TRAYS/PACK) ×3 IMPLANT
UNDERPAD 30X36 HEAVY ABSORB (UNDERPADS AND DIAPERS) ×3 IMPLANT
VALVE AORTIC SZ21 INSP/RESIL (Valve) ×3 IMPLANT
VENT LEFT HEART 12002 (CATHETERS) ×3
WATER STERILE IRR 1000ML POUR (IV SOLUTION) ×6 IMPLANT

## 2020-04-06 NOTE — Anesthesia Procedure Notes (Signed)
Arterial Line Insertion Start/End3/05/2020 7:25 AM, 04/06/2020 7:40 AM Performed by: Val Eagle, MD, anesthesiologist  Patient location: Pre-op. Preanesthetic checklist: patient identified, IV checked, site marked, risks and benefits discussed, surgical consent, monitors and equipment checked, pre-op evaluation, timeout performed and anesthesia consent Lidocaine 1% used for infiltration Left, radial was placed Catheter size: 20 G Hand hygiene performed  and maximum sterile barriers used   Attempts: 1 Procedure performed using ultrasound guided technique. Following insertion, dressing applied and Biopatch. Post procedure assessment: normal and unchanged  Patient tolerated the procedure well with no immediate complications. Additional procedure comments: Patient difficult a line with attempts bilaterally by CRNAs. Attempt x1 of right radial under US guidance but unable to advance catheter. Attempt x 1 Left radial under US guidance. Wire and catheter visualized in artery with initial absence of flow. Pulsatile flow spontaneously resumed and easy aspiration noted. Secured per protocol. Marland Kitchen

## 2020-04-06 NOTE — Discharge Instructions (Signed)

## 2020-04-06 NOTE — Anesthesia Procedure Notes (Addendum)
Arterial Line Insertion Start/End3/05/2020 7:15 AM, 04/06/2020 7:30 AM Performed by: Val Eagle, MD, anesthesiologist  Patient location: Pre-op. Preanesthetic checklist: patient identified, IV checked, site marked, risks and benefits discussed, surgical consent, monitors and equipment checked, pre-op evaluation, timeout performed and anesthesia consent Lidocaine 1% used for infiltration Left, radial was placed Catheter size: 20 Fr Hand hygiene performed  and maximum sterile barriers used   Attempts: 3 Procedure performed using ultrasound guided technique. Ultrasound Notes:anatomy identified, needle tip was noted to be adjacent to the nerve/plexus identified and no ultrasound evidence of intravascular and/or intraneural injection Following insertion, dressing applied and Biopatch. Post procedure assessment: normal and unchanged  Patient tolerated the procedure well with no immediate complications.

## 2020-04-06 NOTE — Transfer of Care (Signed)
Immediate Anesthesia Transfer of Care Note  Patient: [name]  Procedure(s) Performed: AORTIC VALVE REPLACEMENT (AVR) USING INSPIRIS 21 MM AORTIC VALVE (N/A Chest) TRANSESOPHAGEAL ECHOCARDIOGRAM (TEE) (N/A )  Patient Location: ICU  Anesthesia Type:General  Level of Consciousness: Patient remains intubated per anesthesia plan  Airway & Oxygen Therapy: Patient remains intubated per anesthesia plan and Patient placed on Ventilator (see vital sign flow sheet for setting)  Post-op Assessment: Report given to RN and Post -op Vital signs reviewed and stable  Post vital signs: Reviewed and stable  Last Vitals:  Vitals Value Taken Time  BP 116/66 04/06/20 1236  Temp    Pulse 90 04/06/20 1236  Resp 17 04/06/20 1236  SpO2 99 % 04/06/20 1236    Last Pain:  Vitals:   04/06/20 0602  TempSrc:   PainSc: 0-No pain         Complications: No complications documented.

## 2020-04-06 NOTE — Op Note (Signed)
CARDIOVASCULAR SURGERY OPERATIVE NOTE  04/06/2020 [name] [id number]  Surgeon:  Alleen Borne, MD  First Assistant: Doree Fudge,  PA-C   Preoperative Diagnosis:  Severe aortic stenosis   Postoperative Diagnosis:  Same   Procedure:  1. Median Sternotomy 2. Extracorporeal circulation 3.   Aortic valve replacement using a 21 mm Edwards INSPIRIS RESILIA pericardial valve.  Anesthesia:  General Endotracheal   Clinical History/Surgical Indication:  This 55 year old woman has stage D, severe, symptomatic aortic stenosis with New York Heart Association class II symptoms of exertional fatigue and shortness of breath consistent with chronic diastolic congestive heart failure who recently presented with recurrent left chest discomfort. I have personally reviewed her 2D echocardiogram, cardiac catheterization, and CTA studies. Her echocardiogram shows a trileaflet aortic valve with heavy calcification and restricted mobility. The mean gradient is 46 mmHg consistent with severe aortic stenosis. Left ventricular ejection fraction is normal. Cardiac catheterization confirmed severe aortic stenosis and coronary angiography shows no coronary disease. I agree that aortic valve replacement is indicated in this patient for relief of her symptoms and to prevent progressive left ventricular deterioration. Given her young age I think open surgical aortic valve replacement is the best option for her. I did discuss transcatheter aortic valve replacement with her and my recommendation not to perform that due to her young age and the high risk of structural valve deterioration. She does have multiple comorbid risk factors including morbid obesity but is still active and I think she will make a good recovery following aortic valve replacement. I have recommended that we use a bioprosthetic valve to avoid the need for lifelong anticoagulation with Coumadin. 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 She is in agreement with that. I discussed the operative procedure with the patient including alternatives, benefits and risks; including but not limited to bleeding, blood transfusion, infection, stroke, myocardial infarction, graft failure, heart block requiring a permanent pacemaker, organ dysfunction, and death.Athalia A Parrottunderstands and agrees to proceed.  Preparation:  The patient was seen in the preoperative holding area and the correct patient, correct operation were confirmed with the patient after reviewing the medical record and catheterization. The consent was signed by me. Preoperative antibiotics were given. A pulmonary arterial line and radial arterial line were placed by the anesthesia team. The patient was taken back to the operating room and positioned supine on the operating room table. After being placed under general endotracheal anesthesia by the anesthesia team a foley catheter was placed. The neck, chest, abdomen, and both legs were prepped with betadine soap and solution and draped in the usual sterile manner. A surgical time-out was taken and the correct patient and operative procedure were confirmed with the nursing and anesthesia staff.   Pre-bypass TEE:   Complete TEE assessment was performed by Dr. Maple Hudson. This showed severe AS with a mean gradient of 48 mm Hg, mild AI, normal LV systolic function.    Post-bypass TEE:   Normal functioning prosthetic aortic valve with no perivalvular leak or regurgitation through the valve. Mean gradient 13 mm Hg. Left ventricular function preserved. No mitral regurgitation.    Cardiopulmonary Bypass:  A median sternotomy was performed. The pericardium was opened in the midline. Right ventricular function appeared normal. The ascending aorta was  of normal size and had no palpable plaque. There were  no contraindications to aortic cannulation or cross-clamping. The patient was fully systemically heparinized and the ACT was maintained > 400 sec. The proximal aortic arch was cannulated with a 22 F aortic cannula for arterial inflow. Venous cannulation was performed via the right atrial appendage using a two-staged venous cannula. An antegrade cardioplegia/vent cannula was inserted into the mid-ascending aorta. A left ventricular vent was placed via the right superior pulmonary vein. A retrograde cardioplegia cannnula was placed into the coronary sinus via the right atrium. Aortic occlusion was performed with a single cross-clamp. Systemic cooling to 32 degrees Centigrade and topical cooling of the heart with iced saline were used. Hyperkalemic antegrade cold blood cardioplegia was used to induce diastolic arrest and then cold blood retrograde cardioplegia was given at about 20 minute intervals throughout the period of arrest to maintain myocardial temperature at or below 10 degrees centigrade. A temperature probe was inserted into the interventricular septum and an insulating pad was placed in the pericardium. Carbon dioxide was insufflated into the pericardium at 5L/min throughout the procedure to minimize intracardiac air.   Aortic Valve Replacement:   A transverse aortotomy was performed 1 cm above the take-off of the right coronary artery. The native valve was tricuspid with calcified leaflets and moderate annular calcification. The ostia of the coronary arteries were in normal position. There was heavy calcification around the right coronary ostium but it was not obstructed. The native valve leaflets were excised and the annulus was decalcified with rongeurs. Her annulus, aorta and heart were relatively small for her BSA. There was no way to get a larger valve in her without replacing the root with a porcine root but given her other comorbidities and  super morbid obesity with extensive calcification around the RCA ostium I did not think that was wise. There was calcification in the non-coronary sinus which would make root enlargement hazardous. Care was taken to remove all particulate debris. The left ventricle was directly inspected for debris and then irrigated with ice saline solution. The annulus was sized and a size 21 mm Edwards INSPIRIS RESILIA valve was chosen. The model number was [id number] and the serial number was [id number]. While the valve was being prepared 2-0 Ethibond pledgeted horizontal mattress sutures were placed around the annulus with the pledgets in a sub-annular position. The sutures were placed through the sewing ring and the valve lowered into place. The sutures were tied sequentially. The valve seated nicely and the coronary ostia were not obstructed. The prosthetic valve leaflets moved normally and there was no sub-valvular obstruction. The aortotomy was closed using 4-0 Prolene suture in 2 layers with felt strips to reinforce the closure.  Completion:  The patient was rewarmed to 37 degrees Centigrade. De-airing maneuvers were performed and the head placed in trendelenburg position. The crossclamp was removed with a time of 77 minutes. There was spontaneous return of sinus rhythm. The aortotomy was checked for hemostasis. Two temporary epicardial pacing wires were placed on the right atrium and two on the right ventricle. The left ventricular vent and retrograde cardioplegia cannulas were removed. The patient was weaned from CPB without difficulty on no inotropes. CPB time was 96 minutes. Cardiac output was 7 LPM. Heparin was fully reversed with protamine and the aortic and venous cannulas removed. Hemostasis was achieved. Mediastinal drainage tubes were placed. The sternum was closed with double #6 stainless steel wires. The fascia was closed with continuous # 1 vicryl suture. The subcutaneous tissue was closed with 2-0 vicryl  continuous suture.  The skin was closed with 3-0 vicryl subcuticular suture. All sponge, needle, and instrument counts were reported correct at the end of the case. Dry sterile dressings were placed over the incisions and around the chest tubes which were connected to pleurevac suction. The patient was then transported to the surgical intensive care unit in stable condition.

## 2020-04-06 NOTE — Anesthesia Procedure Notes (Addendum)
Procedure Name: Intubation Date/Time: 04/06/2020 8:28 AM Performed by: Alvera Novel, CRNA Pre-anesthesia Checklist: Patient identified, Emergency Drugs available, Suction available and Patient being monitored Patient Re-evaluated:Patient Re-evaluated prior to induction Oxygen Delivery Method: Circle System Utilized Preoxygenation: Pre-oxygenation with 100% oxygen Induction Type: IV induction Ventilation: Mask ventilation without difficulty Laryngoscope Size: Glidescope and 3 Grade View: Grade I Tube type: Oral Tube size: 7.0 mm Number of attempts: 1 Airway Equipment and Method: Stylet and Oral airway Placement Confirmation: ETT inserted through vocal cords under direct vision,  positive ETCO2 and breath sounds checked- equal and bilateral Secured at: 24 cm Tube secured with: Tape Dental Injury: Teeth and Oropharynx as per pre-operative assessment  Difficulty Due To: Difficulty was anticipated Comments: Elective glidescope due to difficulty in the past. Grade 1 view with glidescope 3.

## 2020-04-06 NOTE — Anesthesia Procedure Notes (Signed)
Central Venous Catheter Insertion Performed by: Oleta Mouse, MD, anesthesiologist Start/End3/05/2020 7:15 AM, 04/06/2020 7:25 AM Patient location: Pre-op. Preanesthetic checklist: patient identified, IV checked, site marked, risks and benefits discussed, surgical consent, monitors and equipment checked, pre-op evaluation, timeout performed and anesthesia consent Position: supine Lidocaine 1% used for infiltration and patient sedated Hand hygiene performed  and maximum sterile barriers used  Catheter size: 9 Fr Central line was placed.MAC introducer Procedure performed using ultrasound guided technique. Ultrasound Notes:anatomy identified, needle tip was noted to be adjacent to the nerve/plexus identified, no ultrasound evidence of intravascular and/or intraneural injection and image(s) printed for medical record Attempts: 1 Following insertion, line sutured, dressing applied and Biopatch. Post procedure assessment: blood return through all ports, free fluid flow and no air  Patient tolerated the procedure well with no immediate complications.

## 2020-04-06 NOTE — Procedures (Signed)
Extubation Procedure Note  Patient Details:   Name: [name] DOB: 1965-11-16 MRN: [id number]   Airway Documentation:    Vent end date: 04/06/20 Vent end time: 1812   Evaluation  O2 sats: stable throughout Complications: No apparent complications Patient did tolerate procedure well. Bilateral Breath Sounds: Clear,Diminished   Pt extubated to 4L [place] per rapid wean protocol. NIF was -25 cmH2O and VC was 800 ml. Pt had positive cuff leak prior to extubation. No stridor noted.  Guss Bunde 04/06/2020, 6:13 PM

## 2020-04-06 NOTE — Hospital Course (Signed)
Hospital Course:

## 2020-04-06 NOTE — Brief Op Note (Signed)
04/06/2020  11:02 AM  PATIENT:  [name]  55 y.o. female  PRE-OPERATIVE DIAGNOSIS:  SEVERE AS  POST-OPERATIVE DIAGNOSIS:  SEVERE AS  PROCEDURE:  TRANSESOPHAGEAL ECHOCARDIOGRAM (TEE), AORTIC VALVE REPLACEMENT (AVR) USING INSPIRIS BIOPROSTHETIC VALVE Model # 11500A,  Size 21 MM AORTIC VALVE, and SERIAL # [id number])  SURGEON:  Surgeon(s) and Role:    Alleen Borne, MD - Primary  PHYSICIAN ASSISTANT: Doree Fudge Pa-C  ASSISTANTS: Benay Spice RNFA  ANESTHESIA:   general  EBL: Per anesthesia and perfusion record  DRAINS: Chest tubes placed in the mediastinal and pleural spaces   SPECIMEN:  Source of Specimen:  Native AV leaflets  DISPOSITION OF SPECIMEN:  PATHOLOGY  COUNTS CORRECT:  YES  DICTATION: .Dragon Dictation  PLAN OF CARE: Admit to inpatient   PATIENT DISPOSITION:  ICU - intubated and hemodynamically stable.   Delay start of Pharmacological VTE agent (>24hrs) due to surgical blood loss or risk of bleeding: yes  BASELINE WEIGHT: 144.2 kg

## 2020-04-06 NOTE — Discharge Summary (Incomplete)
Physician Discharge Summary       [address] [employer] [place]       [name] [id number]             [phone]    Patient ID: [name] MRN: [id number] DOB/AGE: [birth year] 55 y.o.  Admit date: 04/06/2020 Discharge date: 04/11/2020  Admission Diagnoses: Severe aortic stenosis  Discharge Diagnoses:  1. S/p AVR 2. Expected post op blood loss anemia 3. Post op atrial fibrillation/flutter with RVR History of the following: Arthritis     Lupus - hands/knees  . 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 GERD (gastroesophageal reflux disease)    on prilosec r/t gastric sleeve surgery  . HLD (hyperlipidemia)   . Iron deficiency anemia    IV dextran [place]  . Nephritis and nephropathy, not specified as acute or chronic, with unspecified pathological lesion in kidney   . Nonspecific abnormal results of liver function study   . Obesity, unspecified   . Other specified acquired hypothyroidism   . Panic disorder without agoraphobia   . Personal history of venous thrombosis and embolism 2002   during pregnancy; ?factor 5 leiden (sees heme)  . Pleural effusion 2005   c/w lupus initial w/u; recurrent Right as pred tapered off July 2011  . Sleep apnea   . 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 Unspecified essential hypertension   History of tobacco abuse   Consults: None  Procedure (s):   CARDIOVASCULAR SURGERY OPERATIVE NOTE  04/06/2020 [name] [id number]  Surgeon:  Cheryl Borne, MD  First Assistant: Cheryl Fudge,  PA-C  Preoperative Diagnosis:  Severe aortic stenosis  Postoperative Diagnosis:  Same  Procedure:  1. Median Sternotomy 2. Extracorporeal circulation 3.   Aortic valve replacement using a 21 mm Edwards INSPIRIS RESILIA pericardial valve.  Anesthesia:  General  Endotracheal   Clinical History/Surgical Indication:  This 55 year old woman has stage D, severe, symptomatic aortic stenosis with New York Heart Association class II symptoms of exertional fatigue and shortness of breath consistent with chronic diastolic congestive heart failure who recently presented with recurrent left chest discomfort. I have personally reviewed her 2D echocardiogram, cardiac catheterization, and CTA studies. Her echocardiogram shows a trileaflet aortic valve with heavy calcification and restricted mobility. The mean gradient is 46 mmHg consistent with severe aortic stenosis. Left ventricular ejection fraction is normal. Cardiac catheterization confirmed severe aortic stenosis and coronary angiography shows no coronary disease. I agree that aortic valve replacement is indicated in this patient for relief of her symptoms and to prevent progressive left ventricular deterioration. Given her young age I think open surgical aortic valve replacement is the best option for her. I did discuss transcatheter aortic valve replacement with her and my recommendation not to perform that due to her young age and the high risk of structural valve deterioration. She does have multiple comorbid risk factors including morbid obesity but is still active and I think she will make a good recovery following aortic valve replacement. I have recommended that we use a bioprosthetic valve to avoid the need for lifelong anticoagulation with Coumadin. 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 She  is in agreement with that. I discussed the operative procedure with the patient including alternatives, benefits and risks; including but not limited to bleeding, blood transfusion, infection, stroke, myocardial infarction, graft failure, heart  block requiring a permanent pacemaker, organ dysfunction, and death.Cheryl A Parrottunderstands and agrees to proceed.   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 Dimensionless index was 0.26. Left ventricular ejection fraction was 60 to 65%. She was admitted on 01/17/2020 after presenting with left-sided chest discomfort after awakening in the morning she described this as a twinge of pain that began in the chest and radiated to the left arm and shoulder lasting a couple minutes and then resolving and coming back it was not associated with exertion but was repetitive. She ruled out for MI. A repeat 2D echo on 01/17/2020 showed an increase in the mean gradient to 46 mmHg with a peak gradient of 72 mmHg. Dimensionless index was 0.26 with a valve area of 0.9 cm. Left ventricular ejection fraction was 65 to 70%. She subsequently underwent cardiac catheterization on 01/19/2020 showing a peak to peak gradient across aortic valve of 58 mmHg with an LVEDP of 20 and a mean pulmonary wedge pressure of 18. PA pressure was 42/14 with a mean of 30. There is no significant coronary disease.She says that since she went home she has had no further chest discomfort. She continues to have exertional fatigue. She reports exertional shortness of breath but it is not always present. She has occasional episodes of dizziness. She has never had syncope. She denies peripheral edema. She has had no orthopnea.  [name]  personally reviewed her 2D echocardiogram, cardiac catheterization, and CTA studies. Her echocardiogram shows a trileaflet aortic valve with heavy calcification and restricted mobility. The mean gradient is 46 mmHg consistent with severe aortic stenosis. Left ventricular ejection fraction is normal. Cardiac catheterization confirmed severe aortic stenosis and coronary angiography shows no coronary disease. [name] agrees that aortic valve replacement is indicated in this patient for relief of her symptoms and to prevent progressive left ventricular deterioration. Given her young age, [name] felt open surgical aortic valve replacement is the best option for her. He did discuss transcatheter aortic valve replacement with her and my recommendation not to perform that due to her young age and the high risk of structural valve deterioration. She does have multiple comorbid risk factors including morbid obesity but is still active and [name] thinks she will make a good recovery following aortic valve replacement. [name] recommended that we use a bioprosthetic valve to avoid the need for lifelong anticoagulation with Coumadin. 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 She is in agreement with that. Potential risks, benefits, and complications of the surgery were discussed with the patient and she agreed to proceed with surgery.   Brief Hospital Course:  [name] was admitted for elective surgery on 04/06/2020 and taken  to the OR where aortic valve replacement was accomplished utilizing a 21 mm Edwards Inspiris Reslila bovine pericardial tissue valve.  Following the procedure, she separated from cardiopulmonary bypass without any difficulty.  She did not require any inotropic support.  She was transferred to the surgical ICU  in stable condition.  Her respiratory status and hemodynamics remained stable.  She was weaned from the ventilator and extubated routinely by 630 PM on the day of surgery.  The chest tubes were removed along with the monitoring lines on postop day 1.  She was mobilized.  She was noted to have some numbness in her right hand with no motor deficits.  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  She was diuresed for expected perioperative volume excess.  By the second postoperative day, the numbness in her right hand was improving.  Diuresis was continued with oral Lasix and Metolazone was added. She was restarted on Coumadin for Liden Factor V. PT and INR were checked daily. As of the day of discharge, she is on Coumadin 7.5 mg daily and her INR is up to 1.3. As discussed with Dr. Laneta Simmers, will be on Coumadin 7.5 mg daily or as directed. She went into a fib/flutter with RVR the am of 03/07. She was given several IV Amiodarone boluses, IV Lopressor, oral Amiodarone, and Labetalol was changed to oral Lopressor. She later converted to sinus rhythm and remained in sinus rhythm, except one run of NSVT. Epicardial pacing wires were removed on 03/07. Of note, nurse informed me that only 3 tips (instead of 4) were present so there might be the tip of retained wire in the patient. Patient informed of this. She has been tolerating a diet and had a bowel movement. All wounds are clean, dry and healing without signs of infection. She is felt surgically stable for discharge today.   Latest Vital Signs: Blood pressure 131/75, pulse 75, temperature 98 F (36.7 C), temperature source Oral, resp. rate 14, height 5\' 5"  (1.651 m), weight (!) 143.7 kg, last menstrual period 11/08/2010, SpO2 100 %.  Physical Exam: Cardiovascular: RRR, no murmur Pulmonary: Slightly diminished bibasilar breath sounds Abdomen: Soft, non tender, bowel sounds present. Extremities: Trace bilateral lower extremity edema. Venous stasis  changes Wounds: Clean and dry.  No erythema or signs of infection.   Discharge Condition: Stable and discharged to home.  Recent laboratory studies:  Lab Results  Component Value Date   WBC 7.6 04/09/2020   HGB 9.0 (L) 04/09/2020   HCT 30.3 (L) 04/09/2020   MCV 80.6 04/09/2020   PLT 112 (L) 04/09/2020   Lab Results  Component Value Date   NA 136 04/09/2020   K 4.3 04/09/2020   CL 103 04/09/2020   CO2 24 04/09/2020   CREATININE 1.02 (H) 04/09/2020   GLUCOSE 104 (H) 04/09/2020      Diagnostic Studies: DG Chest 2 View  Result Date: 04/09/2020 CLINICAL DATA:  Chest pain.  Recent aortic valve replacement EXAM: CHEST - 2 VIEW COMPARISON:  April 08, 2020. FINDINGS: Cordis has been removed. No appreciable pneumothorax. There is a persistent left pleural effusion with areas of airspace consolidation throughout much of the left lower lobe. Calcification in this area may be indicative of previous empyema in this area. There is atelectatic change in the medial right base. There is cardiomegaly with pulmonary vascularity normal. Patient is status post aortic valve replacement. No adenopathy no bone lesions. IMPRESSION: No pneumothorax. Left pleural effusion with airspace opacity in portions of left  lower lobe. Question prior empyema given calcification in the periphery of the left lower lobe region, a stable appearance. There is right base atelectasis. Stable cardiomegaly.  Status post aortic valve replacement. Electronically Signed   By: Bretta Bang III M.D.   On: 04/09/2020 08:01   DG Chest 2 View  Result Date: 04/06/2020 CLINICAL DATA:  Aortic valvular dysfunction, left lung empyema EXAM: CHEST - 2 VIEW COMPARISON:  09/25/2010, CT 01/31/2020 FINDINGS: Rim calcified loculated left basilar pleural effusion, better assessed on prior CT examination, is grossly unchanged. Resultant left basilar opacification. No superimposed focal pulmonary infiltrate. Tiny right pleural effusion is present. No  pneumothorax. Cardiac size is within normal limits. No acute bone abnormality. IMPRESSION: Interval development of tiny right pleural effusion. Stable rim calcified left basilar loculated pleural effusion and resultant left basilar opacification, better assessed on prior CT examination and compatible with given history of chronic empyema. Electronically Signed   By: Helyn Numbers MD   On: 04/06/2020 00:25   DG Chest Port 1 View  Result Date: 04/08/2020 CLINICAL DATA:  Status post aortic valve replacement EXAM: PORTABLE CHEST 1 VIEW COMPARISON:  April 07, 2020 FINDINGS: Stable cardiomediastinal silhouette. Effusion and underlying opacity in the left mid lower lung is stable. Support apparatus is been removed with the exception of a right IJ sheath. No pneumothorax. No other acute abnormalities. IMPRESSION: 1. Support apparatus is been removed with the exception of a right IJ sheath. No pneumothorax. 2. Persistent effusion and opacity in the left mid and lower lung. 3. No other changes. Electronically Signed   By: Gerome Sam III M.D   On: 04/08/2020 07:51   DG Chest Port 1 View  Result Date: 04/07/2020 CLINICAL DATA:  Status post aortic valve replacement. EXAM: PORTABLE CHEST 1 VIEW COMPARISON:  April 06, 2020 FINDINGS: No pneumothorax. The PA catheter is in good position. Chest tubes are stable. Stable cardiomediastinal silhouette. Bibasilar opacities remain, unchanged. No other interval changes. IMPRESSION: Bibasilar opacities are stable and may represent atelectasis. Recommend continued attention on follow-up. Support apparatus as above.  No pneumothorax. Electronically Signed   By: Gerome Sam III M.D   On: 04/07/2020 08:57   DG Chest Port 1 View  Result Date: 04/06/2020 CLINICAL DATA:  Postop open heart surgery. EXAM: PORTABLE CHEST 1 VIEW COMPARISON:  Radiographs 04/05/2020 and 01/17/2020. FINDINGS: 1302 hours. Endotracheal tube tip is in the mid trachea. Enteric tube projects below the  diaphragm, side hole near the gastroesophageal junction. There is a right IJ Swan-Ganz catheter with its tip projecting over the proximal right pulmonary artery. Mediastinal drains are in place. The heart size and mediastinal contours are stable status post interval median sternotomy and aortic valve replacement. There is mildly increased atelectasis at both lung bases. Chronic partially calcified left fibrothorax unchanged. No pneumothorax or acute osseous abnormality. IMPRESSION: 1. Mildly increased bibasilar atelectasis. No evidence of pneumothorax or other complication. 2. Support system as above. Electronically Signed   By: Carey Bullocks M.D.   On: 04/06/2020 13:32   ECHO INTRAOPERATIVE TEE  Result Date: 04/09/2020  *INTRAOPERATIVE TRANSESOPHAGEAL REPORT *  Patient Name:   [name] Date of Exam: 04/06/2020 Medical Rec #:  [id number]      Height:       65.0 in Accession #:    [id number]     Weight:       318.0 lb Date of Birth:  [birth year]       BSA:  2.41 m Patient Age:    54 years       BP:           120/64 mmHg Patient Gender: F              HR:           86 bpm. Exam Location:  Inpatient Transesophogeal exam was perform intraoperatively during surgical procedure. Patient was closely monitored under general anesthesia during the entirety of examination. Indications:     Aortic valve replacement Performing Phys: [id number] [name] Diagnosing Phys: Val Eagle MD Complications: No known complications during this procedure. POST-OP IMPRESSIONS - Left Ventricle: has hyperdynamic systolic function, with an ejection fraction of 70%. The cavity size was normal. The wall motion is normal. - Right Ventricle: The right ventricle appears unchanged from pre-bypass. - Aortic Valve: A bioprosthetic bioprosthetic valve was placed, leaflets are freely mobile and leaflets thin Size; 31mm. No regurgitation post repair. No perivalvular leak noted. - Mitral Valve: There is moderate regurgitation. -  Tricuspid Valve: The tricuspid valve appears unchanged from pre-bypass. There is moderate regurgitation. - Interatrial Septum: The interatrial septum appears unchanged from pre-bypass. PRE-OP FINDINGS  Left Ventricle: The left ventricle has hyperdynamic systolic function, with an ejection fraction of >65%. The cavity size was mildly dilated. There is moderately increased left ventricular wall thickness. No evidence of left ventricular regional wall motion abnormalities. There is moderate concentric left ventricular hypertrophy. Right Ventricle: The right ventricle has normal systolic function. The cavity was dialated. There is no increase in right ventricular wall thickness. There is no aneurysm seen. Left Atrium: Left atrial size was normal in size. No left atrial/left atrial appendage thrombus was detected. The left atrial appendage is well visualized and there is no evidence of thrombus present. Right Atrium: Right atrial size was normal in size. Interatrial Septum: No atrial level shunt detected by color flow Doppler. The interatrial septum appears to be lipomatous. There is no evidence of a patent foramen ovale. Pericardium: A small pericardial effusion is present. The pericardial effusion is posterior to the left ventricle. Mitral Valve: The mitral valve is normal in structure. Mitral valve regurgitation is trivial by color flow Doppler. The MR jet is centrally-directed. There is No evidence of mitral stenosis. There is mild thickening and mild calcification present on the mitral valve anterior cusp with normal mobility and there is mild thickening and mild calcification present on the mitral valve posterior cusp with normal mobility. Tricuspid Valve: The tricuspid valve was dilated in appearance. Tricuspid valve regurgitation is moderate by color flow Doppler. The jet is directed centrally. No evidence of tricuspid stenosis is present. Aortic Valve: The aortic valve is tricuspid Aortic valve regurgitation is  trivial by color flow Doppler. The jet is centrally-directed. There is severe stenosis of the aortic valve. There is moderate aortic annular calcification noted. There is moderate thickening and moderate calcification present on the aortic valve right coronary cusp with severely decreased mobility and there is moderate thickening and moderate calcification present on the aortic valve left coronary cusp with moderately decreased mobility. Pulmonic Valve: The pulmonic valve was normal in structure. Pulmonic valve regurgitation is trivial by color flow Doppler. Aorta: The aortic root and ascending aorta are normal in size and structure. There is evidence of plaque in the descending aorta; Grade I, measuring 1-5mm in size. There is evidence of a dissection in the no aortic dissection. +-------------+---------++ AORTIC VALVE           +-------------+---------++  AV Mean Grad:48.0 mmHg +-------------+---------++  Val Eagle MD Electronically signed by Val Eagle MD Signature Date/Time: 04/09/2020/9:38:03 AM    Final    VAS US DOPPLER PRE CABG  Result Date: 04/04/2020 PREOPERATIVE VASCULAR EVALUATION  Indications:      Pre-AVR. Risk Factors:     Hypertension. Other Factors:    Carotid artery duplex was completed on 01/20/2020. Comparison Study: No prior studies. Performing Technologist: Olen Cordial RVT  Examination Guidelines: A complete evaluation includes B-mode imaging, spectral Doppler, color Doppler, and power Doppler as needed of all accessible portions of each vessel. Bilateral testing is considered an integral part of a complete examination. Limited examinations for reoccurring indications may be performed as noted.  ABI Findings: +--------+------------------+-----+---------+--------+ Right   Rt Pressure (mmHg)IndexWaveform Comment  +--------+------------------+-----+---------+--------+ [id number]                    triphasic          +--------+------------------+-----+---------+--------+ +--------+------------------+-----+---------+-------+ Left    Lt Pressure (mmHg)IndexWaveform Comment +--------+------------------+-----+---------+-------+ [id number]                    triphasic        +--------+------------------+-----+---------+-------+  Right Doppler Findings: +--------+--------+-----+---------+--------+ Site    PressureIndexDoppler  Comments +--------+--------+-----+---------+--------+ [id number]          triphasic         +--------+--------+-----+---------+--------+ Radial               triphasic         +--------+--------+-----+---------+--------+ Ulnar                triphasic         +--------+--------+-----+---------+--------+  Left Doppler Findings: +--------+--------+-----+---------+--------+ Site    PressureIndexDoppler  Comments +--------+--------+-----+---------+--------+ [email or web address]          triphasic         +--------+--------+-----+---------+--------+ Radial               triphasic         +--------+--------+-----+---------+--------+ Ulnar                triphasic         +--------+--------+-----+---------+--------+  Right Upper Extremity: Doppler waveforms decrease 50% with right radial compression. Doppler waveforms decrease 50% with right ulnar compression. Left Upper Extremity: Doppler waveform obliterate with left radial compression. Doppler waveform obliterate with left ulnar compression.  Electronically signed by Coral Else MD on 04/04/2020 at 9:25:26 PM.   Final      Discharge Medications: Allergies as of 04/11/2020      Reactions   Lisinopril Cough   Lovenox [enoxaparin Sodium] Itching   Moxifloxacin Other (See Comments)   REACTION: hallucinations   Lovenox [enoxaparin]    Itching       Medication List    STOP taking these medications   labetalol 200 MG tablet Commonly known as: NORMODYNE   nitroGLYCERIN 0.4 MG SL tablet Commonly known  as: Nitrostat     TAKE these medications   amiodarone 200 MG tablet Commonly known as: PACERONE Take 1 tablet (200 mg total) by mouth daily. For 10 days then take 200 mg daily thereafter   aspirin 81 MG EC tablet Take 1 tablet (81 mg total) by mouth daily. Swallow whole.   Bariatric Multivitamins/Iron Caps Take 1 tablet by mouth daily.   bismuth subsalicylate 262 MG/15ML suspension Commonly known as: PEPTO BISMOL Take 30 mLs by mouth every 6 (six) hours as needed for indigestion or diarrhea or loose stools.  calcium carbonate 500 MG chewable tablet Commonly known as: TUMS - dosed in mg elemental calcium Chew 1-2 tablets by mouth 3 (three) times daily as needed for indigestion or heartburn.   cyanocobalamin 1000 MCG/ML injection Commonly known as: (VITAMIN B-12) Inject 1,000 mcg into the muscle every 30 (thirty) days.   furosemide 40 MG tablet Commonly known as: LASIX Take 1 tablet (40 mg total) by mouth daily. For 4 days then stop   hydroxychloroquine 200 MG tablet Commonly known as: PLAQUENIL Take 200 mg by mouth 2 (two) times daily.   levothyroxine 200 MCG tablet Commonly known as: SYNTHROID TAKE 1 TABLET (200 MCG TOTAL) BY MOUTH DAILY BEFORE BREAKFAST.   metoprolol tartrate 50 MG tablet Commonly known as: LOPRESSOR Take 1 tablet (50 mg total) by mouth 2 (two) times daily.   omeprazole 20 MG capsule Commonly known as: PRILOSEC TAKE 1 CAPSULE (20 MG TOTAL) BY MOUTH 2 (TWO) TIMES DAILY BEFORE A MEAL.   PARoxetine 40 MG tablet Commonly known as: PAXIL TAKE 1 TABLET BY MOUTH EVERY DAY IN THE MORNING What changed: See the new instructions.   potassium chloride SA 20 MEQ tablet Commonly known as: KLOR-CON Take 1 tablet (20 mEq total) by mouth daily. For 4 days then stop.   rosuvastatin 20 MG tablet Commonly known as: CRESTOR TAKE 1 TABLET BY MOUTH EVERY DAY What changed: when to take this   traMADol 50 MG tablet Commonly known as: ULTRAM Take 1 tablet (50 mg  total) by mouth every 6 (six) hours as needed for moderate pain.   warfarin 7.5 MG tablet Commonly known as: COUMADIN Take as directed. If you are unsure how to take this medication, talk to your nurse or doctor. Original instructions: Daily or as directed What changed: additional instructions      The patient has been discharged on:   1.Beta Blocker:  Yes [  x ]                              No   [   ]                              If No, reason:  2.Ace Inhibitor/ARB: Yes [   ]                                     No  [  x  ]                                     If No, reason:Labile BP  3.Statin:   Yes [  x]                  No  [   ]                  If No, reason:  4.Ecasa:  Yes  [ x  ]                  No   [   ]                  If No, reason:  Follow Up Appointments:  Follow-up Information    [place],  Payton Doughty, MD. Go on 05/16/2020.   Specialty: Cardiothoracic Surgery Why: PA/LAT CXR to be taken (at [facility] which is in the same building as Dr. Sharee Pimple office) on 04/13 at 9:30 am;Appointment time is at 10:00 am Contact information: [address] [place] [address] [phone]        [facility]. Go on 04/20/2020.   Specialty: Cardiothoracic Surgery Why: Appointment is with nurse only for chest tube suture removal. Appointment time is at 11:30 am Contact information: [address] [place], [address] [place] [address] [phone]       Tereso Newcomer T, [place]. Go on 04/30/2020.   Specialties: Cardiology, Physician Assistant Why: Appointment time is at 11:15 am Contact information: [address] [place] [address] [phone]        [facility]. Go on 05/21/2020.   Specialty: Cardiology Why: Appointment time is at 9:20 am Contact information: [address] [id number] m[place] [address] [phone]        [facility] at [place]. Go on 04/12/2020.   Why: Appointment is for PT/INR (as on Coumadin) to be drawn and appointment time is at 1:00 pm Contact information: [address] [place], [place] [address] [phone]              Signed: Lelon Huh [facility] 04/11/2020, 8:44 AM

## 2020-04-06 NOTE — Anesthesia Procedure Notes (Signed)
Central Venous Catheter Insertion Performed by: Val Eagle, MD, anesthesiologist Start/End3/05/2020 7:15 AM, 04/06/2020 7:25 AM Patient location: Pre-op. Preanesthetic checklist: patient identified, IV checked, site marked, risks and benefits discussed, surgical consent, monitors and equipment checked, pre-op evaluation, timeout performed and anesthesia consent Hand hygiene performed  and maximum sterile barriers used  PA cath was placed.Swan type:thermodilution Procedure performed without using ultrasound guided technique. Attempts: 1 Patient tolerated the procedure well with no immediate complications.

## 2020-04-06 NOTE — Progress Notes (Signed)
  Echocardiogram Echocardiogram Transesophageal has been performed.  Gerda Diss 04/06/2020, 8:55 AM

## 2020-04-06 NOTE — Interval H&P Note (Signed)
History and Physical Interval Note:  04/06/2020 7:23 AM  [name]  has presented today for surgery, with the diagnosis of SEVERE AS.  The various methods of treatment have been discussed with the patient and family. After consideration of risks, benefits and other options for treatment, the patient has consented to  Procedure(s): AORTIC VALVE REPLACEMENT (AVR) (N/A) TRANSESOPHAGEAL ECHOCARDIOGRAM (TEE) (N/A) as a surgical intervention.  [name] history has been reviewed, patient examined, no change in status, stable for surgery.  I have reviewed the patient's chart and labs.  Questions were answered to the patient's satisfaction.     Alleen Borne

## 2020-04-07 ENCOUNTER — Inpatient Hospital Stay (HOSPITAL_COMMUNITY): Payer: 59

## 2020-04-07 LAB — BASIC METABOLIC PANEL
Anion gap: 6 (ref 5–15)
Anion gap: 7 (ref 5–15)
BUN: 10 mg/dL (ref 6–20)
BUN: 15 mg/dL (ref 6–20)
CO2: 24 mmol/L (ref 22–32)
CO2: 24 mmol/L (ref 22–32)
Calcium: 8.4 mg/dL — ABNORMAL LOW (ref 8.9–10.3)
Calcium: 8.9 mg/dL (ref 8.9–10.3)
Chloride: 107 mmol/L (ref 98–111)
Chloride: 107 mmol/L (ref 98–111)
Creatinine, Ser: 0.89 mg/dL (ref 0.44–1.00)
Creatinine, Ser: 1.14 mg/dL — ABNORMAL HIGH (ref 0.44–1.00)
GFR, Estimated: >60 mL/min (ref >60)
GFR, Estimated: 58 mL/min — ABNORMAL LOW (ref >60)
Glucose, Bld: 123 mg/dL — ABNORMAL HIGH (ref 70–99)
Glucose, Bld: 114 mg/dL — ABNORMAL HIGH (ref 70–99)
Potassium: 4.5 mmol/L (ref 3.5–5.1)
Potassium: 4.2 mmol/L (ref 3.5–5.1)
Sodium: 137 mmol/L (ref 135–145)
Sodium: 138 mmol/L (ref 135–145)

## 2020-04-07 LAB — MAGNESIUM
Magnesium: 2.3 mg/dL (ref 1.7–2.4)
Magnesium: 2.2 mg/dL (ref 1.7–2.4)

## 2020-04-07 LAB — CBC
HCT: 29.9 % — ABNORMAL LOW (ref 36.0–46.0)
HCT: 30.4 % — ABNORMAL LOW (ref 36.0–46.0)
Hemoglobin: 8.9 g/dL — ABNORMAL LOW (ref 12.0–15.0)
Hemoglobin: 9 g/dL — ABNORMAL LOW (ref 12.0–15.0)
MCH: 24.3 pg — ABNORMAL LOW (ref 26.0–34.0)
MCH: 23.9 pg — ABNORMAL LOW (ref 26.0–34.0)
MCHC: 29.8 g/dL — ABNORMAL LOW (ref 30.0–36.0)
MCHC: 29.6 g/dL — ABNORMAL LOW (ref 30.0–36.0)
MCV: 81.5 fL (ref 80.0–100.0)
MCV: 80.9 fL (ref 80.0–100.0)
Platelets: 109 10*3/uL — ABNORMAL LOW (ref 150–400)
Platelets: 115 10*3/uL — ABNORMAL LOW (ref 150–400)
RBC: 3.67 MIL/uL — ABNORMAL LOW (ref 3.87–5.11)
RBC: 3.76 MIL/uL — ABNORMAL LOW (ref 3.87–5.11)
RDW: 16.6 % — ABNORMAL HIGH (ref 11.5–15.5)
RDW: 16.8 % — ABNORMAL HIGH (ref 11.5–15.5)
WBC: 7.9 10*3/uL (ref 4.0–10.5)
WBC: 8.9 10*3/uL (ref 4.0–10.5)
nRBC: 0 % (ref 0.0–0.2)
nRBC: 0 % (ref 0.0–0.2)

## 2020-04-07 LAB — POCT I-STAT 7, (LYTES, BLD GAS, ICA,H+H)
Acid-base deficit: 1 mmol/L (ref 0.0–2.0)
Bicarbonate: 25.7 mmol/L (ref 20.0–28.0)
Calcium, Ion: 1.27 mmol/L (ref 1.15–1.40)
HCT: 29 % — ABNORMAL LOW (ref 36.0–46.0)
Hemoglobin: 9.9 g/dL — ABNORMAL LOW (ref 12.0–15.0)
O2 Saturation: 99 %
Patient temperature: 36.6
Potassium: 5.1 mmol/L (ref 3.5–5.1)
Sodium: 141 mmol/L (ref 135–145)
TCO2: 27 mmol/L (ref 22–32)
pCO2 arterial: 53.7 mmHg — ABNORMAL HIGH (ref 32.0–48.0)
pH, Arterial: 7.285 — ABNORMAL LOW (ref 7.350–7.450)
pO2, Arterial: 133 mmHg — ABNORMAL HIGH (ref 83.0–108.0)

## 2020-04-07 LAB — GLUCOSE, CAPILLARY
Glucose-Capillary: 106 mg/dL — ABNORMAL HIGH (ref 70–99)
Glucose-Capillary: 111 mg/dL — ABNORMAL HIGH (ref 70–99)
Glucose-Capillary: 114 mg/dL — ABNORMAL HIGH (ref 70–99)
Glucose-Capillary: 116 mg/dL — ABNORMAL HIGH (ref 70–99)
Glucose-Capillary: 119 mg/dL — ABNORMAL HIGH (ref 70–99)
Glucose-Capillary: 123 mg/dL — ABNORMAL HIGH (ref 70–99)
Glucose-Capillary: 127 mg/dL — ABNORMAL HIGH (ref 70–99)

## 2020-04-07 MED ORDER — CALCIUM CARBONATE ANTACID 500 MG PO CHEW
1.0000 | CHEWABLE_TABLET | Freq: Three times a day (TID) | ORAL | Status: DC | PRN
  Administered 2020-04-07 – 2020-04-08 (×2): 400 mg via ORAL
  Filled 2020-04-07 (×2): qty 2

## 2020-04-07 MED ORDER — LABETALOL HCL 300 MG PO TABS
300.0000 mg | ORAL_TABLET | Freq: Two times a day (BID) | ORAL | Status: DC
  Administered 2020-04-07 – 2020-04-08 (×4): 300 mg via ORAL
  Filled 2020-04-07 (×5): qty 1

## 2020-04-07 MED ORDER — FUROSEMIDE 10 MG/ML IJ SOLN
40.0000 mg | Freq: Once | INTRAMUSCULAR | Status: AC
  Administered 2020-04-07: 40 mg via INTRAVENOUS
  Filled 2020-04-07: qty 4

## 2020-04-07 MED ORDER — BISMUTH SUBSALICYLATE 262 MG/15ML PO SUSP
30.0000 mL | Freq: Four times a day (QID) | ORAL | Status: DC | PRN
  Administered 2020-04-07: 30 mL via ORAL
  Filled 2020-04-07: qty 236

## 2020-04-07 NOTE — Progress Notes (Signed)
1 Day Post-Op Procedure(s) (LRB): AORTIC VALVE REPLACEMENT (AVR) USING INSPIRIS 21 MM AORTIC VALVE (N/A) TRANSESOPHAGEAL ECHOCARDIOGRAM (TEE) (N/A) Subjective: Some incisional pain. C/o numbness right hand  Objective: Vital signs in last 24 hours: Temp:  [96.08 F (35.6 C)-99.14 F (37.3 C)] 98.96 F (37.2 C) (03/05 0845) Pulse Rate:  [87-104] 95 (03/05 0845) Cardiac Rhythm: Normal sinus rhythm (03/05 0800) Resp:  [11-33] 17 (03/05 0845) BP: (96-150)/(56-111) 128/75 (03/05 0845) SpO2:  [96 %-100 %] 97 % (03/05 0845) Arterial Line BP: (63-147)/(49-125) 131/117 (03/05 0015) FiO2 (%):  [40 %-50 %] 40 % (03/04 1620)  Hemodynamic parameters for last 24 hours: PAP: (18-42)/(7-19) 20/10 CO:  [5.4 L/min-7.4 L/min] 7.4 L/min CI:  [2.3 L/min/m2-3.1 L/min/m2] 3.1 L/min/m2  Intake/Output from previous day: 03/04 0701 - 03/05 0700 In: 4286.7 [I.V.:3010.7; Blood:725; IV Piggyback:551] Out: 3495 [Urine:1680; Blood:1500; Chest Tube:315] Intake/Output this shift: Total I/O In: 287.9 [P.O.:240; I.V.:47.9] Out: 130 [Urine:80; Chest Tube:50]  General appearance: alert, cooperative and no distress Neurologic: motor intact, decreased sensation right hand Heart: regular rate and rhythm Lungs: diminished breath sounds bibasilar Abdomen: normal findings: soft, non-tender Extremities: edema 2+  Lab Results: Recent Labs    04/06/20 2210 04/07/20 0400  WBC 7.5 7.9  HGB 9.4* 8.9*  HCT 31.2* 29.9*  PLT 116* 109*   BMET:  Recent Labs    04/06/20 2210 04/07/20 0400  NA 137 137  K 4.8 4.5  CL 107 107  CO2 24 24  GLUCOSE 127* 123*  BUN 12 10  CREATININE 0.92 0.89  CALCIUM 8.4* 8.4*    PT/INR:  Recent Labs    04/06/20 1245  LABPROT 16.6*  INR 1.4*   ABG    Component Value Date/Time   PHART 7.285 (L) 04/06/2020 1904   HCO3 25.7 04/06/2020 1904   TCO2 27 04/06/2020 1904   ACIDBASEDEF 1.0 04/06/2020 1904   O2SAT 99.0 04/06/2020 1904   CBG (last 3)  Recent Labs     04/07/20 0019 04/07/20 0400 04/07/20 0651  GLUCAP 119* 123* 114*    Assessment/Plan: S/P Procedure(s) (LRB): AORTIC VALVE REPLACEMENT (AVR) USING INSPIRIS 21 MM AORTIC VALVE (N/A) TRANSESOPHAGEAL ECHOCARDIOGRAM (TEE) (N/A) - POD # 1 AVR  CV- good hemodynamics- dc Swan, Aline already out  Will resume labetalol and stop metoprolol  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

## 2020-04-07 NOTE — Progress Notes (Signed)
      [address]       [place] [address]             [phone]      POD # 1  Resting BP 113/65   Pulse 92   Temp 97.8 F (36.6 C)   Resp 17   Ht 5\' 5"  (1.651 m)   Wt (!) 144.2 kg   LMP 11/08/2010   SpO2 94%   BMI 52.92 kg/m  RA 97% sat No drips  Intake/Output Summary (Last 24 hours) at 04/07/2020 1820 Last data filed at 04/07/2020 1800 Gross per 24 hour  Intake 1954.06 ml  Output 1345 ml  Net 609.06 ml   K= 4.2, creatinine 1.14 Hct= 30 CBG well controlled  Will give another dose of IV Lasix this evening  06/07/2020 C. Viviann Spare, MD [facility] [phone]

## 2020-04-07 NOTE — Plan of Care (Signed)
°  Problem: Education: Goal: Knowledge of General Education information will improve Description: Including pain rating scale, medication(s)/side effects and non-pharmacologic comfort measures Outcome: Progressing   Problem: Clinical Measurements: Goal: Ability to maintain clinical measurements within normal limits will improve Outcome: Progressing Goal: Will remain free from infection Outcome: Adequate for Discharge Goal: Diagnostic test results will improve Outcome: Progressing Goal: Respiratory complications will improve Outcome: Progressing Goal: Cardiovascular complication will be avoided Outcome: Progressing   Problem: Nutrition: Goal: Adequate nutrition will be maintained Outcome: Not Progressing   Problem: Elimination: Goal: Will not experience complications related to urinary retention Outcome: Progressing   Problem: Pain Managment: Goal: General experience of comfort will improve Outcome: Progressing   Problem: Safety: Goal: Ability to remain free from injury will improve Outcome: Adequate for Discharge   Problem: Skin Integrity: Goal: Risk for impaired skin integrity will decrease Outcome: Progressing   Problem: Education: Goal: Knowledge of disease or condition will improve Outcome: Progressing

## 2020-04-07 NOTE — Plan of Care (Signed)
  Problem: Education: Goal: Knowledge of General Education information will improve Description: Including pain rating scale, medication(s)/side effects and non-pharmacologic comfort measures Outcome: Progressing   Problem: Health Behavior/Discharge Planning: Goal: Ability to manage health-related needs will improve Outcome: Progressing   Problem: Clinical Measurements: Goal: Ability to maintain clinical measurements within normal limits will improve Outcome: Progressing Goal: Will remain free from infection Outcome: Progressing Goal: Diagnostic test results will improve Outcome: Progressing Goal: Respiratory complications will improve Outcome: Progressing Goal: Cardiovascular complication will be avoided Outcome: Progressing   Problem: Activity: Goal: Risk for activity intolerance will decrease Outcome: Progressing   Problem: Nutrition: Goal: Adequate nutrition will be maintained Outcome: Progressing   Problem: Coping: Goal: Level of anxiety will decrease Outcome: Progressing   Problem: Elimination: Goal: Will not experience complications related to bowel motility Outcome: Progressing Goal: Will not experience complications related to urinary retention Outcome: Progressing   Problem: Pain Managment: Goal: General experience of comfort will improve Outcome: Progressing   Problem: Safety: Goal: Ability to remain free from injury will improve Outcome: Progressing   Problem: Skin Integrity: Goal: Risk for impaired skin integrity will decrease Outcome: Progressing   Problem: Education: Goal: Will demonstrate proper wound care and an understanding of methods to prevent future damage Outcome: Progressing Goal: Knowledge of disease or condition will improve Outcome: Progressing Goal: Knowledge of the prescribed therapeutic regimen will improve Outcome: Progressing Goal: Individualized Educational Video(s) Outcome: Progressing   

## 2020-04-07 NOTE — Progress Notes (Signed)
RN removed Left Radial A-line d/t malpositioning/dampended waveforms showing inaccurate blood pressures. RN was unable to pull back blood from the A-line. After multiple attempts to fix adjust the A-Line, there was no success. Gauze and Tegaderm dressing placed over the site after maintaining hemostasis.

## 2020-04-08 ENCOUNTER — Inpatient Hospital Stay (HOSPITAL_COMMUNITY): Payer: 59

## 2020-04-08 LAB — PROTIME-INR
INR: 1.2 (ref 0.8–1.2)
Prothrombin Time: 14.3 seconds (ref 11.4–15.2)

## 2020-04-08 LAB — GLUCOSE, CAPILLARY
Glucose-Capillary: 121 mg/dL — ABNORMAL HIGH (ref 70–99)
Glucose-Capillary: 136 mg/dL — ABNORMAL HIGH (ref 70–99)
Glucose-Capillary: 135 mg/dL — ABNORMAL HIGH (ref 70–99)
Glucose-Capillary: 108 mg/dL — ABNORMAL HIGH (ref 70–99)
Glucose-Capillary: 140 mg/dL — ABNORMAL HIGH (ref 70–99)

## 2020-04-08 LAB — BASIC METABOLIC PANEL
Anion gap: 9 (ref 5–15)
BUN: 16 mg/dL (ref 6–20)
CO2: 26 mmol/L (ref 22–32)
Calcium: 9.2 mg/dL (ref 8.9–10.3)
Chloride: 103 mmol/L (ref 98–111)
Creatinine, Ser: 1.08 mg/dL — ABNORMAL HIGH (ref 0.44–1.00)
GFR, Estimated: >60 mL/min (ref >60)
Glucose, Bld: 129 mg/dL — ABNORMAL HIGH (ref 70–99)
Potassium: 4.2 mmol/L (ref 3.5–5.1)
Sodium: 138 mmol/L (ref 135–145)

## 2020-04-08 LAB — CBC
HCT: 30 % — ABNORMAL LOW (ref 36.0–46.0)
Hemoglobin: 8.7 g/dL — ABNORMAL LOW (ref 12.0–15.0)
MCH: 23.3 pg — ABNORMAL LOW (ref 26.0–34.0)
MCHC: 29 g/dL — ABNORMAL LOW (ref 30.0–36.0)
MCV: 80.4 fL (ref 80.0–100.0)
Platelets: 104 10*3/uL — ABNORMAL LOW (ref 150–400)
RBC: 3.73 MIL/uL — ABNORMAL LOW (ref 3.87–5.11)
RDW: 17.1 % — ABNORMAL HIGH (ref 11.5–15.5)
WBC: 8.6 10*3/uL (ref 4.0–10.5)
nRBC: 0 % (ref 0.0–0.2)

## 2020-04-08 MED ORDER — WARFARIN - PHARMACIST DOSING INPATIENT: Freq: Every day | Status: DC

## 2020-04-08 MED ORDER — INSULIN ASPART 100 UNIT/ML ~~LOC~~ SOLN
0.0000 [IU] | Freq: Three times a day (TID) | SUBCUTANEOUS | Status: DC
  Administered 2020-04-08: 2 [IU] via SUBCUTANEOUS

## 2020-04-08 MED ORDER — CHLORHEXIDINE GLUCONATE CLOTH 2 % EX PADS
6.0000 | MEDICATED_PAD | Freq: Every day | CUTANEOUS | Status: DC
  Administered 2020-04-08: 6 via TOPICAL

## 2020-04-08 MED ORDER — SODIUM CHLORIDE 0.9% FLUSH
3.0000 mL | Freq: Two times a day (BID) | INTRAVENOUS | Status: DC
  Administered 2020-04-08 – 2020-04-10 (×5): 3 mL via INTRAVENOUS

## 2020-04-08 MED ORDER — MAGNESIUM HYDROXIDE 400 MG/5ML PO SUSP: 30.0000 mL | Freq: Every day | ORAL | Status: DC | PRN

## 2020-04-08 MED ORDER — WARFARIN SODIUM 7.5 MG PO TABS
7.5000 mg | ORAL_TABLET | Freq: Once | ORAL | Status: AC
  Administered 2020-04-08: 7.5 mg via ORAL
  Filled 2020-04-08: qty 1

## 2020-04-08 MED ORDER — SODIUM CHLORIDE 0.9 % IV SOLN: 250.0000 mL | INTRAVENOUS | Status: DC | PRN

## 2020-04-08 MED ORDER — POTASSIUM CHLORIDE CRYS ER 10 MEQ PO TBCR
20.0000 meq | EXTENDED_RELEASE_TABLET | Freq: Every day | ORAL | Status: DC
  Administered 2020-04-08 – 2020-04-10 (×3): 20 meq via ORAL
  Filled 2020-04-08 (×6): qty 2

## 2020-04-08 MED ORDER — ASPIRIN EC 81 MG PO TBEC
81.0000 mg | DELAYED_RELEASE_TABLET | Freq: Every day | ORAL | Status: DC
  Administered 2020-04-08 – 2020-04-11 (×4): 81 mg via ORAL
  Filled 2020-04-08 (×4): qty 1

## 2020-04-08 MED ORDER — FUROSEMIDE 40 MG PO TABS
40.0000 mg | ORAL_TABLET | Freq: Every day | ORAL | Status: DC
  Administered 2020-04-08 – 2020-04-09 (×2): 40 mg via ORAL
  Filled 2020-04-08 (×2): qty 1

## 2020-04-08 MED ORDER — ~~LOC~~ CARDIAC SURGERY, PATIENT & FAMILY EDUCATION: Freq: Once | Status: AC

## 2020-04-08 MED ORDER — SODIUM CHLORIDE 0.9% FLUSH: 3.0000 mL | INTRAVENOUS | Status: DC | PRN

## 2020-04-08 NOTE — Progress Notes (Signed)
Pt arrived from [facility] via wheelchair.  Placed on telemetry. CCMD called, VSS. Pt oriented to unit. Encouraged to use incentive spirometer. Pt aware of mobility goals. Pt resting with call bell within reach.  Will continue to monitor.

## 2020-04-08 NOTE — Progress Notes (Signed)
2 Days Post-Op Procedure(s) (LRB): AORTIC VALVE REPLACEMENT (AVR) USING INSPIRIS 21 MM AORTIC VALVE (N/A) TRANSESOPHAGEAL ECHOCARDIOGRAM (TEE) (N/A) Subjective: Feels better today, numbness in R hand improving  Objective: Vital signs in last 24 hours: Temp:  [97.8 F (36.6 C)-98.6 F (37 C)] 98.6 F (37 C) (03/06 0744) Pulse Rate:  [81-116] 94 (03/06 0800) Cardiac Rhythm: Normal sinus rhythm (03/06 0800) Resp:  [12-34] 21 (03/06 0800) BP: (73-141)/(48-83) 117/73 (03/06 0800) SpO2:  [85 %-97 %] 93 % (03/06 0800) Weight:  [147 kg] 147 kg (03/06 0500)  Hemodynamic parameters for last 24 hours:    Intake/Output from previous day: 03/05 0701 - 03/06 0700 In: 1627.2 [P.O.:680; I.V.:747.2; IV Piggyback:200] Out: 1455 [Urine:1385; Chest Tube:70] Intake/Output this shift: Total I/O In: 40.1 [I.V.:40.1] Out: 35 [Urine:35]  General appearance: alert, cooperative and no distress Neurologic: motor intact Heart: regular rate and rhythm Lungs: diminished breath sounds bibasilar Abdomen: normal findings: soft, non-tender Extremities: R hand cool, brisk cap refill  Lab Results: Recent Labs    04/07/20 1534 04/08/20 0303  WBC 8.9 8.6  HGB 9.0* 8.7*  HCT 30.4* 30.0*  PLT 115* 104*   BMET:  Recent Labs    04/07/20 1534 04/08/20 0303  NA 138 138  K 4.2 4.2  CL 107 103  CO2 24 26  GLUCOSE 114* 129*  BUN 15 16  CREATININE 1.14* 1.08*  CALCIUM 8.9 9.2    PT/INR:  Recent Labs    04/08/20 0303  LABPROT 14.3  INR 1.2   ABG    Component Value Date/Time   PHART 7.285 (L) 04/06/2020 1904   HCO3 25.7 04/06/2020 1904   TCO2 27 04/06/2020 1904   ACIDBASEDEF 1.0 04/06/2020 1904   O2SAT 99.0 04/06/2020 1904   CBG (last 3)  Recent Labs    04/07/20 2349 04/08/20 0348 04/08/20 0742  GLUCAP 106* 121* 136*    Assessment/Plan: S/P Procedure(s) (LRB): AORTIC VALVE REPLACEMENT (AVR) USING INSPIRIS 21 MM AORTIC VALVE (N/A) TRANSESOPHAGEAL ECHOCARDIOGRAM (TEE) (N/A) -  Doing well POD # 2 CV- in SR, continue ASA, change to 81 mg as coumadin to restart  On Labetalol RESP- chronic changes on CXR, o/w OK RENAL- creatinine and lytes oK  PO lasix, K, dc Foley ENDO- CBG well controlled- change to AC and HS GI- tolerating diet HEME- Anemia secondary to ABL  Thrombocytopenia- stable  Factor V Leiden- restart warfarin today Continue cardiac rehab  LOS: 2 days    Loreli Slot 04/08/2020

## 2020-04-08 NOTE — Progress Notes (Signed)
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 Lisinopril Cough  . Lovenox [Enoxaparin Sodium] Itching  . Moxifloxacin Other (See Comments)    REACTION: hallucinations  . Lovenox [Enoxaparin]     Itching     Patient Measurements: Height: 5\' 5"  (165.1 cm) Weight: (!) 147 kg (324 lb 1.2 oz) IBW/kg (Calculated) : 57  Vital Signs: Temp: 98.6 F (37 C) (03/06 0744) Temp Source: Oral (03/06 0345) BP: 132/80 (03/06 0900) Pulse Rate: 99 (03/06 0900)  Labs: Recent Labs    04/06/20 1245 04/06/20 1300 04/07/20 0400 04/07/20 1534 04/08/20 0303  HGB 8.8*   < > 8.9* 9.0* 8.7*  HCT 31.5*   < > 29.9* 30.4* 30.0*  PLT 121*   < > 109* 115* 104*  APTT 27  --   --   --   --   LABPROT 16.6*  --   --   --  14.3  INR 1.4*  --   --   --  1.2  CREATININE  --    < > 0.89 1.14* 1.08*   < > = values in this interval not displayed.    Estimated Creatinine Clearance: 88.4 mL/min (A) (by C-G formula based on SCr of 1.08 mg/dL (H)).   Medical History: Past Medical History:  Diagnosis Date  . Arthritis    Lupus - hands/knees  . 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 GERD (gastroesophageal reflux disease)    on prilosec r/t gastric sleeve surgery  . HLD (hyperlipidemia)   . Iron deficiency anemia    IV dextran Coladonato  . Nephritis and nephropathy, not specified as acute or chronic, with unspecified pathological lesion in kidney   . Nonspecific abnormal results of liver function study   . Obesity, unspecified   . Other specified acquired hypothyroidism   . Panic disorder without agoraphobia   . Personal history of venous thrombosis and embolism 2002   during pregnancy; ?factor 5 leiden (sees heme)  . Pleural effusion 2005   c/w  lupus initial w/u; recurrent Right as pred tapered off July 2011  . Sleep apnea   . 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 Unspecified essential hypertension    Assessment: 55 year old female pod 2 for bAVR. 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 New orders received by surgery today to restart her warfarin. INR is currently normal at 1.2, hgb stable in 8s and plt count low normal at 104.   Prior to admission warfarin regimen was 15 mg (7.5 mg x 2) every Sun, Tue, Thu; 11.25 mg (7.5 mg x 1.5) all other days  Goal of Therapy:  INR goal 2.5-3.5 per outpatient records Monitor platelets by anticoagulation protocol: Yes   Plan:  Warfarin 7.5 mg tonight Daily INR for now  [birth year]-29-1989 PharmD., BCPS Clinical Pharmacist 04/08/2020 9:37 AM

## 2020-04-08 NOTE — Plan of Care (Signed)
  Problem: Education: Goal: Knowledge of General Education information will improve Description: Including pain rating scale, medication(s)/side effects and non-pharmacologic comfort measures Outcome: Progressing   Problem: Health Behavior/Discharge Planning: Goal: Ability to manage health-related needs will improve Outcome: Progressing   Problem: Clinical Measurements: Goal: Ability to maintain clinical measurements within normal limits will improve Outcome: Progressing Goal: Will remain free from infection Outcome: Progressing Goal: Diagnostic test results will improve Outcome: Progressing Goal: Respiratory complications will improve Outcome: Progressing Goal: Cardiovascular complication will be avoided Outcome: Progressing   Problem: Activity: Goal: Risk for activity intolerance will decrease Outcome: Progressing   Problem: Nutrition: Goal: Adequate nutrition will be maintained Outcome: Progressing   Problem: Coping: Goal: Level of anxiety will decrease Outcome: Progressing   Problem: Elimination: Goal: Will not experience complications related to bowel motility Outcome: Progressing Goal: Will not experience complications related to urinary retention Outcome: Progressing   Problem: Pain Managment: Goal: General experience of comfort will improve Outcome: Progressing   Problem: Safety: Goal: Ability to remain free from injury will improve Outcome: Progressing   Problem: Skin Integrity: Goal: Risk for impaired skin integrity will decrease Outcome: Progressing   Problem: Education: Goal: Will demonstrate proper wound care and an understanding of methods to prevent future damage Outcome: Progressing Goal: Knowledge of disease or condition will improve Outcome: Progressing Goal: Knowledge of the prescribed therapeutic regimen will improve Outcome: Progressing Goal: Individualized Educational Video(s) Outcome: Progressing   

## 2020-04-09 ENCOUNTER — Encounter (HOSPITAL_COMMUNITY): Payer: Self-pay | Admitting: Surgery

## 2020-04-09 ENCOUNTER — Inpatient Hospital Stay (HOSPITAL_COMMUNITY): Payer: 59

## 2020-04-09 LAB — CBC
HCT: 30.3 % — ABNORMAL LOW (ref 36.0–46.0)
Hemoglobin: 9 g/dL — ABNORMAL LOW (ref 12.0–15.0)
MCH: 23.9 pg — ABNORMAL LOW (ref 26.0–34.0)
MCHC: 29.7 g/dL — ABNORMAL LOW (ref 30.0–36.0)
MCV: 80.6 fL (ref 80.0–100.0)
Platelets: 112 10*3/uL — ABNORMAL LOW (ref 150–400)
RBC: 3.76 MIL/uL — ABNORMAL LOW (ref 3.87–5.11)
RDW: 17.3 % — ABNORMAL HIGH (ref 11.5–15.5)
WBC: 7.6 10*3/uL (ref 4.0–10.5)
nRBC: 0.3 % — ABNORMAL HIGH (ref 0.0–0.2)

## 2020-04-09 LAB — BASIC METABOLIC PANEL
Anion gap: 9 (ref 5–15)
BUN: 18 mg/dL (ref 6–20)
CO2: 24 mmol/L (ref 22–32)
Calcium: 8.9 mg/dL (ref 8.9–10.3)
Chloride: 103 mmol/L (ref 98–111)
Creatinine, Ser: 1.02 mg/dL — ABNORMAL HIGH (ref 0.44–1.00)
GFR, Estimated: >60 mL/min (ref >60)
Glucose, Bld: 104 mg/dL — ABNORMAL HIGH (ref 70–99)
Potassium: 4.3 mmol/L (ref 3.5–5.1)
Sodium: 136 mmol/L (ref 135–145)

## 2020-04-09 LAB — ECHO INTRAOPERATIVE TEE
AV Mean grad: 48 mmHg
Height: 65 in
Weight: 5088 oz

## 2020-04-09 LAB — PROTIME-INR
INR: 1.2 (ref 0.8–1.2)
Prothrombin Time: 14.3 seconds (ref 11.4–15.2)

## 2020-04-09 LAB — GLUCOSE, CAPILLARY
Glucose-Capillary: 88 mg/dL (ref 70–99)
Glucose-Capillary: 99 mg/dL (ref 70–99)
Glucose-Capillary: 104 mg/dL — ABNORMAL HIGH (ref 70–99)

## 2020-04-09 LAB — SURGICAL PATHOLOGY

## 2020-04-09 MED ORDER — METOPROLOL TARTRATE 5 MG/5ML IV SOLN
5.0000 mg | INTRAVENOUS | Status: DC | PRN
  Administered 2020-04-09 (×2): 5 mg via INTRAVENOUS
  Filled 2020-04-09: qty 5

## 2020-04-09 MED ORDER — AMIODARONE HCL 200 MG PO TABS
400.0000 mg | ORAL_TABLET | Freq: Every day | ORAL | Status: DC
  Administered 2020-04-09 – 2020-04-11 (×3): 400 mg via ORAL
  Filled 2020-04-09 (×3): qty 2

## 2020-04-09 MED ORDER — METOPROLOL TARTRATE 5 MG/5ML IV SOLN
5.0000 mg | Freq: Once | INTRAVENOUS | Status: AC
  Administered 2020-04-09: 5 mg via INTRAVENOUS
  Filled 2020-04-09: qty 5

## 2020-04-09 MED ORDER — AMIODARONE IV BOLUS ONLY 150 MG/100ML
150.0000 mg | Freq: Once | INTRAVENOUS | Status: AC
  Administered 2020-04-09: 150 mg via INTRAVENOUS
  Filled 2020-04-09: qty 100

## 2020-04-09 MED ORDER — METOLAZONE 5 MG PO TABS
2.5000 mg | ORAL_TABLET | Freq: Once | ORAL | Status: AC
  Administered 2020-04-09: 2.5 mg via ORAL
  Filled 2020-04-09: qty 1

## 2020-04-09 MED ORDER — WARFARIN SODIUM 7.5 MG PO TABS
7.5000 mg | ORAL_TABLET | Freq: Once | ORAL | Status: AC
  Administered 2020-04-09: 7.5 mg via ORAL
  Filled 2020-04-09: qty 1

## 2020-04-09 MED ORDER — METOPROLOL TARTRATE 50 MG PO TABS
50.0000 mg | ORAL_TABLET | Freq: Two times a day (BID) | ORAL | Status: DC
  Administered 2020-04-09 – 2020-04-11 (×4): 50 mg via ORAL
  Filled 2020-04-09 (×4): qty 1

## 2020-04-09 MED FILL — Thrombin (Recombinant) For Soln 20000 Unit: CUTANEOUS | Qty: 1 | Status: AC

## 2020-04-09 MED FILL — Magnesium Sulfate Inj 50%: INTRAMUSCULAR | Qty: 10 | Status: AC

## 2020-04-09 MED FILL — Heparin Sodium (Porcine) Inj 1000 Unit/ML: INTRAMUSCULAR | Qty: 30 | Status: AC

## 2020-04-09 MED FILL — Potassium Chloride Inj 2 mEq/ML: INTRAVENOUS | Qty: 40 | Status: AC

## 2020-04-09 NOTE — Progress Notes (Signed)
   04/09/20 0846  Assess: MEWS Score  Temp 98.7 F (37.1 C)  BP 105/69  Pulse Rate (!) 146  ECG Heart Rate (!) 146  Resp (!) 27  Level of Consciousness Alert  O2 Device Room Air  Assess: MEWS Score  MEWS Temp 0  MEWS Systolic 0  MEWS Pulse 3  MEWS RR 2  MEWS LOC 0  MEWS Score 5  MEWS Score Color Red  Assess: if the MEWS score is Yellow or Red  Were vital signs taken at a resting state? No  Focused Assessment Change from prior assessment (see assessment flowsheet)  Early Detection of Sepsis Score *See Row Information* Low  MEWS guidelines implemented *See Row Information* Yes  Treat  MEWS Interventions Escalated (See documentation below)  Pain Scale 0-10  Pain Score 4  Pain Type Surgical pain  Pain Location Chest  Pain Orientation Anterior  Pain Descriptors / Indicators Aching  Pain Onset On-going  Take Vital Signs  Increase Vital Sign Frequency  Red: Q 1hr X 4 then Q 4hr X 4, if remains red, continue Q 4hrs  Notify: Charge Nurse/RN  Name of Charge Nurse/RN Notified Marcelino Duster  Date Charge Nurse/RN Notified 04/09/20  Time Charge Nurse/RN Notified [id number]  Notify: Provider  Provider Name/Title Bartle/Tessa  Date Provider Notified 04/09/20  Time Provider Notified [phone]  Notification Type Face-to-face  Notification Reason Change in status  Provider response See new orders  Date of Provider Response 04/09/20  Time of Provider Response [phone]

## 2020-04-09 NOTE — Progress Notes (Signed)
Pt HR continues to be 130s. BPs are stable. Not severely symptomatic and feels fine @ rest. Gave PO lopressor early. Will continue to monitor.

## 2020-04-09 NOTE — Anesthesia Postprocedure Evaluation (Signed)
Anesthesia Post Note  Patient: [name]  Procedure(s) Performed: AORTIC VALVE REPLACEMENT (AVR) USING INSPIRIS 21 MM AORTIC VALVE (N/A Chest) TRANSESOPHAGEAL ECHOCARDIOGRAM (TEE) (N/A )     Patient location during evaluation: SICU Anesthesia Type: General Level of consciousness: sedated Pain management: pain level controlled Vital Signs Assessment: post-procedure vital signs reviewed and stable Respiratory status: patient remains intubated per anesthesia plan Cardiovascular status: stable Postop Assessment: no apparent nausea or vomiting Anesthetic complications: no   No complications documented.  Last Vitals:  Vitals:   04/09/20 0357 04/09/20 0820  BP: 130/80 (!) 103/57  Pulse: 83 88  Resp: 16 18  Temp: 36.5 C 36.7 C  SpO2: 94% 99%    Last Pain:  Vitals:   04/09/20 0820  TempSrc: Oral  PainSc:                  Yasamin Karel

## 2020-04-09 NOTE — Progress Notes (Signed)
Patient still in atrial fibrillation with RVR. Will give another IV Amiodarone bolus and IV one time Lopressor. Will also stop Labetalol and start Lopressor, as discussed with Dr. Laneta Simmers.

## 2020-04-09 NOTE — Progress Notes (Addendum)
Converted to afib/RVR @ 838 am during ambulation.  PA notified & new orders pending.   EKG completed

## 2020-04-09 NOTE — Progress Notes (Addendum)
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 Lisinopril Cough  . Lovenox [Enoxaparin Sodium] Itching  . Moxifloxacin Other (See Comments)    REACTION: hallucinations  . Lovenox [Enoxaparin]     Itching     Patient Measurements: Height: 5\' 5"  (165.1 cm) Weight: (!) 147.8 kg (325 lb 13.4 oz) IBW/kg (Calculated) : 57  Vital Signs: Temp: 98.1 F (36.7 C) (03/07 0820) Temp Source: Oral (03/07 0820) BP: 103/57 (03/07 0820) Pulse Rate: 88 (03/07 0820)  Labs: Recent Labs    04/06/20 1245 04/06/20 1300 04/07/20 1534 04/08/20 0303 04/09/20 0142  HGB 8.8*   < > 9.0* 8.7* 9.0*  HCT 31.5*   < > 30.4* 30.0* 30.3*  PLT 121*   < > 115* 104* 112*  APTT 27  --   --   --   --   LABPROT 16.6*  --   --  14.3 14.3  INR 1.4*  --   --  1.2 1.2  CREATININE  --    < > 1.14* 1.08* 1.02*   < > = values in this interval not displayed.    Estimated Creatinine Clearance: 93.9 mL/min (A) (by C-G formula based on SCr of 1.02 mg/dL (H)).   Medical History: Past Medical History:  Diagnosis Date  . Arthritis    Lupus - hands/knees  . 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 GERD (gastroesophageal reflux disease)    on prilosec r/t gastric sleeve surgery  . HLD (hyperlipidemia)   . Iron deficiency anemia    IV dextran Coladonato  . Nephritis and nephropathy, not specified as acute or chronic, with unspecified pathological lesion in kidney   . Nonspecific abnormal results of liver function study   . Obesity, unspecified   . Other specified acquired hypothyroidism   . Panic disorder without agoraphobia   . Personal history of venous thrombosis and embolism 2002   during pregnancy; ?factor 5 leiden (sees heme)  . Pleural effusion 2005   c/w lupus initial  w/u; recurrent Right as pred tapered off July 2011  . Sleep apnea   . 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 Unspecified essential hypertension    Assessment: 63 yoF on warfarin PTA for hx factor V Leiden deficiency and hx DVT/PE with IVC in place admitted for bAVR placement 3/4. Warfarin started per pharmacy 3/6.   INR today subtherapeutic as expected at 1.2, CBC is low but stable. Will give conservative doses for now as pt is POD#3 and new amiodarone starting for POAF.  **Prior to admission warfarin regimen was 15 mg (7.5 mg x 2) every Sun, Tue, Thu; 11.25 mg (7.5 mg x 1.5) all other days  Goal of Therapy:  INR goal 2.5-3.5 per outpatient records Monitor platelets by anticoagulation protocol: Yes   Plan:  -Warfarin 7.5mg  PO x1 tonight -Daily protime   04-29-1990, PharmD, BCPS, [facility] Clinical Pharmacist [phone] Please check AMION for all [facility] Pharmacy numbers 04/09/2020

## 2020-04-09 NOTE — Progress Notes (Signed)
Patient ID: [name], female   DOB: 1965-08-15, 55 y.o.   MRN: [id number] Developed atrial fib with RVR this am and started on oral amio with IV bolus. Labetalol switched to Lopressor.  She is still in atrial fib, ? Flutter at times 130's. I tried to RAP multiple times but could not convert. I ordered another bolus of amio and will continue oral dose and po metoprolol. Added prn IV metoprolol. She feels ok.

## 2020-04-09 NOTE — Progress Notes (Signed)
Mobility Specialist: Progress Note   04/09/20 1603  Mobility  Activity Ambulated in room  Level of Assistance Independent  Assistive Device None  Distance Ambulated (ft) 60 ft  Mobility Response Tolerated well  Mobility performed by Mobility specialist  $Mobility charge 1 Mobility   Pre-Mobility: 125 HR During Mobility: 149 HR Post-Mobility: 128 HR  Pt c/o feeling SOB towards end of ambulation and needed to sit EOB to recover. Pt was coached through pursed lip breathing, recovered quickly. Pt back to bed after walk with call bell at her side.   [facility] Kennette Cuthrell Mobility Specialist Mobility Specialist Phone: [phone]

## 2020-04-09 NOTE — Progress Notes (Signed)
3 Days Post-Op Procedure(s) (LRB): AORTIC VALVE REPLACEMENT (AVR) USING INSPIRIS 21 MM AORTIC VALVE (N/A) TRANSESOPHAGEAL ECHOCARDIOGRAM (TEE) (N/A) Subjective:No complaints. Ambulating  No BM yet but feels like she will today.  Objective: Vital signs in last 24 hours: Temp:  [97.6 F (36.4 C)-98.6 F (37 C)] 97.7 F (36.5 C) (03/07 0357) Pulse Rate:  [83-99] 83 (03/07 0357) Cardiac Rhythm: Normal sinus rhythm (03/06 1901) Resp:  [13-22] 16 (03/07 0357) BP: (112-133)/(70-85) 130/80 (03/07 0357) SpO2:  [93 %-99 %] 94 % (03/07 0357) Weight:  [147.8 kg] 147.8 kg (03/07 0400)  Hemodynamic parameters for last 24 hours:    Intake/Output from previous day: 03/06 0701 - 03/07 0700 In: 160.1 [P.O.:120; I.V.:40.1] Out: 410 [Urine:410] Intake/Output this shift: No intake/output data recorded.  General appearance: alert and cooperative Neurologic: intact Heart: regular rate and rhythm, S1, S2 normal, no murmur Lungs: diminished breath sounds LLL Extremities: edema mild Wound: incision ok  Lab Results: Recent Labs    04/08/20 0303 04/09/20 0142  WBC 8.6 7.6  HGB 8.7* 9.0*  HCT 30.0* 30.3*  PLT 104* 112*   BMET:  Recent Labs    04/08/20 0303 04/09/20 0142  NA 138 136  K 4.2 4.3  CL 103 103  CO2 26 24  GLUCOSE 129* 104*  BUN 16 18  CREATININE 1.08* 1.02*  CALCIUM 9.2 8.9    PT/INR:  Recent Labs    04/09/20 0142  LABPROT 14.3  INR 1.2   ABG    Component Value Date/Time   PHART 7.285 (L) 04/06/2020 1904   HCO3 25.7 04/06/2020 1904   TCO2 27 04/06/2020 1904   ACIDBASEDEF 1.0 04/06/2020 1904   O2SAT 99.0 04/06/2020 1904   CBG (last 3)  Recent Labs    04/08/20 1610 04/08/20 2133 04/09/20 0615  GLUCAP 108* 140* 88   CXR: stable bilateral atelectasis. Chronic loculated left pleural collection.  Assessment/Plan: S/P Procedure(s) (LRB): AORTIC VALVE REPLACEMENT (AVR) USING INSPIRIS 21 MM AORTIC VALVE (N/A) TRANSESOPHAGEAL ECHOCARDIOGRAM (TEE)  (N/A)  POD 3  Hemodynamically stable in sinus rhythm. Continue Labetalol that she was on at home.  Volume excess: wt is 8 lbs over preop. Continue diuresis. Will add a dose of metolazone to her lasix today.  Glucose under good control.  Coumadin per pharmacy for Factor V Leiden.  DC pacing wires today.  Continue IS, ambulation.   LOS: 3 days    Alleen Borne 04/09/2020

## 2020-04-09 NOTE — Progress Notes (Signed)
CARDIAC REHAB PHASE I   PRE:  Rate/Rhythm: 87 SR  BP:  Supine:   Sitting: 103/57  Standing:    SaO2: 97%RA  MODE:  Ambulation: 100 ft   POST:  Rate/Rhythm: 160's afib  BP:  Supine: 105/69  Sitting:   Standing:    SaO2: 98%RA hall to 88% in room   Put on 2L 0800-0900 Came to see pt while eating breakfast. Asked that I return in 15 minutes to walk. Pt in NSR prior to walk. Pt stopped three times due to SOB, and I checked sats and HR at 150-160s afib on third rest stop.. Had pt sit on rollator and we rolled pt back to room and assisted to bed. Pt had walked 100 ft, stopping three times and using pursed lip breathing prior to returning to room.. Put pt on 2L in room. C/o palpitations. RN aware and in to see pt.   Luetta Nutting, RN BSN  04/09/2020 8:57 AM

## 2020-04-10 LAB — PROTIME-INR
INR: 1.2 (ref 0.8–1.2)
Prothrombin Time: 15 seconds (ref 11.4–15.2)

## 2020-04-10 MED ORDER — ASPIRIN 81 MG PO TBEC: 81.0000 mg | DELAYED_RELEASE_TABLET | Freq: Every day | ORAL | 11 refills | Status: AC

## 2020-04-10 MED ORDER — FUROSEMIDE 10 MG/ML IJ SOLN
40.0000 mg | Freq: Once | INTRAMUSCULAR | Status: AC
  Administered 2020-04-10: 40 mg via INTRAVENOUS
  Filled 2020-04-10: qty 4

## 2020-04-10 MED ORDER — WARFARIN SODIUM 10 MG PO TABS
10.0000 mg | ORAL_TABLET | Freq: Once | ORAL | Status: AC
  Administered 2020-04-10: 10 mg via ORAL
  Filled 2020-04-10: qty 1

## 2020-04-10 MED ORDER — FUROSEMIDE 40 MG PO TABS: 40.0000 mg | ORAL_TABLET | Freq: Every day | ORAL | Status: DC

## 2020-04-10 MED FILL — Sodium Chloride IV Soln 0.9%: INTRAVENOUS | Qty: 3000 | Status: AC

## 2020-04-10 MED FILL — Electrolyte-R (PH 7.4) Solution: INTRAVENOUS | Qty: 3000 | Status: AC

## 2020-04-10 MED FILL — Mannitol IV Soln 20%: INTRAVENOUS | Qty: 500 | Status: AC

## 2020-04-10 MED FILL — Sodium Bicarbonate IV Soln 8.4%: INTRAVENOUS | Qty: 50 | Status: AC

## 2020-04-10 MED FILL — Heparin Sodium (Porcine) Inj 1000 Unit/ML: INTRAMUSCULAR | Qty: 20 | Status: AC

## 2020-04-10 MED FILL — Lidocaine HCl Local Soln Prefilled Syringe 100 MG/5ML (2%): INTRAMUSCULAR | Qty: 10 | Status: AC

## 2020-04-10 NOTE — Progress Notes (Addendum)
Doree Fudge [facility] made aware of wire removal and only 3 ends intact.   No new orders received at this time. Will continue to monitor patient. Mical Brun, Randall An RN

## 2020-04-10 NOTE — Progress Notes (Signed)
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 Lisinopril Cough  . Lovenox [Enoxaparin Sodium] Itching  . Moxifloxacin Other (See Comments)    REACTION: hallucinations  . Lovenox [Enoxaparin]     Itching     Patient Measurements: Height: 5\' 5"  (165.1 cm) Weight: (!) 145.1 kg (319 lb 14.2 oz) IBW/kg (Calculated) : 57  Vital Signs: Temp: 97.9 F (36.6 C) (03/08 0725) Temp Source: Oral (03/08 0725) BP: 149/97 (03/08 0725) Pulse Rate: 82 (03/08 0817)  Labs: Recent Labs    04/07/20 1534 04/08/20 0303 04/09/20 0142 04/10/20 0230  HGB 9.0* 8.7* 9.0*  --   HCT 30.4* 30.0* 30.3*  --   PLT 115* 104* 112*  --   LABPROT  --  14.3 14.3 15.0  INR  --  1.2 1.2 1.2  CREATININE 1.14* 1.08* 1.02*  --     Estimated Creatinine Clearance: 92.8 mL/min (A) (by C-G formula based on SCr of 1.02 mg/dL (H)).   Medical History: Past Medical History:  Diagnosis Date  . Arthritis    Lupus - hands/knees  . 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 GERD (gastroesophageal reflux disease)    on prilosec r/t gastric sleeve surgery  . HLD (hyperlipidemia)   . Iron deficiency anemia    IV dextran Coladonato  . Nephritis and nephropathy, not specified as acute or chronic, with unspecified pathological lesion in kidney   . Nonspecific abnormal results of liver function study   . Obesity, unspecified   . Other specified acquired hypothyroidism   . Panic disorder without agoraphobia   . Personal history of venous thrombosis and embolism 2002   during pregnancy; ?factor 5 leiden (sees heme)  . Pleural effusion 2005   c/w lupus initial w/u; recurrent Right as pred tapered off July 2011  . Sleep apnea   . Systemic lupus erythematosus (HCC)    renal GN, hx of  pericardial eff in late 90's  . 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 Warfarin started per pharmacy 3/6.   INR today remains stagnant at 1.2. AFib RVR noted yesterday and amiodarone started. Will give higher warfarin dose tonight to facilitate INR increase.  **Prior to admission warfarin regimen was 15 mg (7.5 mg x 2) every Sun, Tue, Thu; 11.25 mg (7.5 mg x 1.5) all other days  Goal of Therapy:  INR goal 2.5-3.5 per outpatient records Monitor platelets by anticoagulation protocol: Yes   Plan:  -Warfarin 10mg  PO x1 tonight -Daily protime   04-29-1989, PharmD, BCPS, [facility] Clinical Pharmacist [phone] Please check AMION for all [facility] numbers 04/10/2020

## 2020-04-10 NOTE — Progress Notes (Signed)
Epicardial pacing wires removed. End of 3 wires intact. Pt tolerated well. Site clean and dry. VS stable. Bedrest x1hr.  Brooke Pace, RN 04/10/20 12:06 PM

## 2020-04-10 NOTE — Progress Notes (Signed)
Mobility Specialist: Progress Note   04/10/20 1624  Mobility  Activity Ambulated in hall  Level of Assistance Modified independent, requires aide device or extra time  Assistive Device Four wheel walker  Distance Ambulated (ft) 320 ft  Mobility Response Tolerated well  Mobility performed by Mobility specialist  $Mobility charge 1 Mobility   Pre-Mobility: 72 HR, BP, SpO2 During Mobility: 94 HR, SpO2 Post-Mobility: 84 HR, 147/88 BP, 100% SpO2  Pt stopped to take two brief standing breaks during ambulation due to feeling SOB. Pt otherwise asx. Pt is sitting EOB per request with call bell at her side.   [facility] Antoinette Haskett Mobility Specialist Mobility Specialist Phone: [phone]

## 2020-04-10 NOTE — Progress Notes (Signed)
CARDIAC REHAB PHASE I   PRE:  Rate/Rhythm: 76 SR  BP:  Supine:   Sitting: 137/84  Standing:    SaO2: 97%RA  MODE:  Ambulation: 240 ft   POST:  Rate/Rhythm: 91 SR  BP:  Supine:   Sitting: 143/91  Standing:    SaO2: 99%RA 0938-1005 Pt walked 240 ft on RA with rolling walker with steady gait. Stopped many times to rest during walk. HR remained in NSR and stable. Encouraged pursed lip breathing. Generalized weakness. To sitting on side of bed after walk. Tolerated well.  Sats good on RA.   Luetta Nutting, RN BSN  04/10/2020 10:03 AM

## 2020-04-10 NOTE — Progress Notes (Signed)
At approx. 0145 pt converted to NSR. Will continue to monitor.

## 2020-04-10 NOTE — Progress Notes (Addendum)
      [address]       [place] [address]             [phone]        4 Days Post-Op Procedure(s) (LRB): AORTIC VALVE REPLACEMENT (AVR) USING INSPIRIS 21 MM AORTIC VALVE (N/A) TRANSESOPHAGEAL ECHOCARDIOGRAM (TEE) (N/A)  Subjective: Patient walked several times yesterday. She had a bowel movement this am. She has no specific complaint this am  Objective: Vital signs in last 24 hours: Temp:  [98.1 F (36.7 C)-98.8 F (37.1 C)] 98.8 F (37.1 C) (03/08 0615) Pulse Rate:  [80-157] 80 (03/08 0615) Cardiac Rhythm: Atrial flutter (03/07 1929) Resp:  [16-27] 16 (03/08 0615) BP: (103-147)/(57-91) 147/87 (03/08 0615) SpO2:  [96 %-100 %] 100 % (03/08 0615) Weight:  [145.1 kg] 145.1 kg (03/08 0500)  Pre op weight 144.2  kg Current Weight  04/10/20 (!) 145.1 kg       Intake/Output from previous day: 03/07 0701 - 03/08 0700 In: 335.7 [I.V.:335.7] Out: -    Physical Exam:  Cardiovascular: RRR, no murmur Pulmonary: Diminished bibasilar breath sounds Abdomen: Soft, non tender, bowel sounds present. Extremities: Mild bilateral lower extremity edema. Wounds: Clean and dry.  No erythema or signs of infection.  Lab Results: CBC: Recent Labs    04/08/20 0303 04/09/20 0142  WBC 8.6 7.6  HGB 8.7* 9.0*  HCT 30.0* 30.3*  PLT 104* 112*   BMET:  Recent Labs    04/08/20 0303 04/09/20 0142  NA 138 136  K 4.2 4.3  CL 103 103  CO2 26 24  GLUCOSE 129* 104*  BUN 16 18  CREATININE 1.08* 1.02*  CALCIUM 9.2 8.9    PT/INR:  Lab Results  Component Value Date   INR 1.2 04/10/2020   INR 1.2 04/09/2020   INR 1.2 04/08/2020   PROTIME 15.6 (H) 12/06/2012   PROTIME 19.2 (H) 06/03/2012   ABG:  INR: Will add last result for INR, ABG once components are confirmed Will add last 4 CBG results once components are confirmed  Assessment/Plan:  1. CV - She went into a fib with RVR, a flutter yesterday. She received several IV Amiodarone boluses, IV  Lopressor, oral Amiodarone, and oral Labetalol was changed to oral Lopressor. Dr. Laneta Simmers also tried to rapid A pace her. INR this am 1.2 and she is on Coumadin for Liden Factor V . Continue with Amiodarone 400 mg bid, Lopressor 50 mg bid,Coumadin 7.5 mg daily. 2.  Pulmonary - On room air. Encourage incentive spirometer. 3. Volume Overload - On Lasix 40 mg daily 4.  Expected post op acute blood loss anemia - H and H yesterday stable at 9 and 30.3 5. Mild thrombocytopenia-platelets yesterday 112,000 6. Remove EPW 7. History of hypothyroidism-continue Levothyroxine 200 mcg daily 8. Possible discharge 1-2 days, depending on rhythm control  Cheryl M ZimmermanPA-C 04/10/2020,7:02 AM   Chart reviewed, patient examined, agree with above.   Converted to sinus early this morning. Continue present regimen with Coumadin. Wt is about down to preop. She may be able to go home tomorrow if no changes.

## 2020-04-11 ENCOUNTER — Other Ambulatory Visit: Payer: Self-pay | Admitting: *Deleted

## 2020-04-11 LAB — PROTIME-INR
INR: 1.3 — ABNORMAL HIGH (ref 0.8–1.2)
Prothrombin Time: 15.3 s — ABNORMAL HIGH (ref 11.4–15.2)

## 2020-04-11 MED ORDER — POTASSIUM CHLORIDE CRYS ER 20 MEQ PO TBCR: 20.0000 meq | EXTENDED_RELEASE_TABLET | Freq: Every day | ORAL | 0 refills | Status: DC

## 2020-04-11 MED ORDER — AMIODARONE HCL 200 MG PO TABS: 200.0000 mg | ORAL_TABLET | Freq: Every day | ORAL | 1 refills | Status: DC

## 2020-04-11 MED ORDER — TRAMADOL HCL 50 MG PO TABS: 50.0000 mg | ORAL_TABLET | Freq: Four times a day (QID) | ORAL | 0 refills | Status: DC | PRN

## 2020-04-11 MED ORDER — METOPROLOL TARTRATE 50 MG PO TABS: 50.0000 mg | ORAL_TABLET | Freq: Two times a day (BID) | ORAL | 1 refills | Status: DC

## 2020-04-11 MED ORDER — FUROSEMIDE 40 MG PO TABS: 40.0000 mg | ORAL_TABLET | Freq: Every day | ORAL | 0 refills | Status: DC

## 2020-04-11 MED ORDER — AMIODARONE HCL 200 MG PO TABS: 200.0000 mg | ORAL_TABLET | Freq: Two times a day (BID) | ORAL | 1 refills | Status: DC

## 2020-04-11 MED ORDER — WARFARIN SODIUM 7.5 MG PO TABS: ORAL_TABLET | ORAL | 0 refills | Status: DC

## 2020-04-11 NOTE — Progress Notes (Signed)
[id number] Education completed with pt who voiced understanding. Reviewed staying in the tube and sternal precautions. Encouraged IS, gave walking instructions for ex, heart healthy diet and encouraged to watch sodium, and CRP 2. Referral sent to [facility] program. Pt does not feel that she needs rolling walker for home.  Luetta Nutting RN BSN 04/11/2020 8:52 AM

## 2020-04-11 NOTE — Progress Notes (Signed)
Patient given discharge instructions, medication list with next dose due with prescriptions sent to personal pharmacy. Patient also given  follow up appointments. Patient IV and tele were dcd. Will discharge home as ordered all questions answered. Transported to exit via wheel chair and hospital staff. Cloer, Randall An RN

## 2020-04-11 NOTE — Progress Notes (Signed)
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 Lisinopril Cough  . Lovenox [Enoxaparin Sodium] Itching  . Moxifloxacin Other (See Comments)    REACTION: hallucinations  . Lovenox [Enoxaparin]     Itching     Patient Measurements: Height: 5\' 5"  (165.1 cm) Weight: (!) 143.7 kg (316 lb 11.2 oz) IBW/kg (Calculated) : 57  Vital Signs: Temp: 98 F (36.7 C) (03/09 0754) Temp Source: Oral (03/09 0754) BP: 131/75 (03/09 0754) Pulse Rate: 75 (03/09 0754)  Labs: Recent Labs    04/09/20 0142 04/10/20 0230 04/11/20 0041  HGB 9.0*  --   --   HCT 30.3*  --   --   PLT 112*  --   --   LABPROT 14.3 15.0 15.3*  INR 1.2 1.2 1.3*  CREATININE 1.02*  --   --     Estimated Creatinine Clearance: 92.3 mL/min (A) (by C-G formula based on SCr of 1.02 mg/dL (H)).   Medical History: Past Medical History:  Diagnosis Date  . Arthritis    Lupus - hands/knees  . 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 GERD (gastroesophageal reflux disease)    on prilosec r/t gastric sleeve surgery  . HLD (hyperlipidemia)   . Iron deficiency anemia    IV dextran Coladonato  . Nephritis and nephropathy, not specified as acute or chronic, with unspecified pathological lesion in kidney   . Nonspecific abnormal results of liver function study   . Obesity, unspecified   . Other specified acquired hypothyroidism   . Panic disorder without agoraphobia   . Personal history of venous thrombosis and embolism 2002   during pregnancy; ?factor 5 leiden (sees heme)  . Pleural effusion 2005   c/w lupus initial w/u; recurrent Right as pred tapered off July 2011  . Sleep apnea   . 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 Warfarin started per pharmacy 3/6.   INR today remains stagnant at 1.3. AFib RVR noted 3/7 and amiodarone started. Surgery recommending 7.5mg  of warfarin daily at discharge. Patient on warfarin prior to admit, will provide re-education prior to discharge.   **Prior to admission warfarin regimen was 15 mg (7.5 mg x 2) every Sun, Tue, Thu; 11.25 mg (7.5 mg x 1.5) all other days  Goal of Therapy:  INR goal 2.5-3.5 per outpatient records Monitor platelets by anticoagulation protocol: Yes   Plan:  Warfarin 7.5mg  daily ordered at discharge

## 2020-04-11 NOTE — Progress Notes (Addendum)
      [address]       [place] [address]             [phone]        5 Days Post-Op Procedure(s) (LRB): AORTIC VALVE REPLACEMENT (AVR) USING INSPIRIS 21 MM AORTIC VALVE (N/A) TRANSESOPHAGEAL ECHOCARDIOGRAM (TEE) (N/A)  Subjective: Patient eating breakfast and no complaints this am. She hopes to go home.  Objective: Vital signs in last 24 hours: Temp:  [97.6 F (36.4 C)-98.3 F (36.8 C)] 98.2 F (36.8 C) (03/09 0336) Pulse Rate:  [73-82] 74 (03/09 0336) Cardiac Rhythm: Normal sinus rhythm (03/08 2100) Resp:  [16-20] 20 (03/09 0336) BP: (94-149)/(60-97) 104/60 (03/09 0336) SpO2:  [95 %-100 %] 95 % (03/09 0336) Weight:  [143.7 kg] 143.7 kg (03/09 0336)  Pre op weight 144.2  kg Current Weight  04/11/20 (!) 143.7 kg       Intake/Output from previous day: No intake/output data recorded.   Physical Exam:  Cardiovascular: RRR, no murmur Pulmonary: Slightly diminished bibasilar breath sounds Abdomen: Soft, non tender, bowel sounds present. Extremities: Trace bilateral lower extremity edema. Venous stasis changes Wounds: Clean and dry.  No erythema or signs of infection.  Lab Results: CBC: Recent Labs    04/09/20 0142  WBC 7.6  HGB 9.0*  HCT 30.3*  PLT 112*   BMET:  Recent Labs    04/09/20 0142  NA 136  K 4.3  CL 103  CO2 24  GLUCOSE 104*  BUN 18  CREATININE 1.02*  CALCIUM 8.9    PT/INR:  Lab Results  Component Value Date   INR 1.3 (H) 04/11/2020   INR 1.2 04/10/2020   INR 1.2 04/09/2020   PROTIME 15.6 (H) 12/06/2012   PROTIME 19.2 (H) 06/03/2012   ABG:  INR: Will add last result for INR, ABG once components are confirmed Will add last 4 CBG results once components are confirmed  Assessment/Plan:  1. CV - She went into a fib with RVR, a flutter 03/07. She received several IV Amiodarone boluses, IV Lopressor, oral Amiodarone, and oral Labetalol was changed to oral Lopressor. Dr. Laneta Simmers also tried to rapid A pace her.  She was SR yesterday. She had a brief run of NSVT earlier this am. Maintaining SR since then. INR this am 1.3 and she is on Coumadin for Liden Factor V . Continue with Amiodarone 400 mg bid, Lopressor 50 mg bid,Coumadin. As discussed with Dr. Laneta Simmers, will give Coumadin 7.5 mg daily. 2.  Pulmonary - On room air. Encourage incentive spirometer. 3. Volume Overload - Will hold Lasix today as SBP decreased but will give for 4 more days at discharge for mild volume overload 4.  Expected post op acute blood loss anemia - H and H yesterday stable at 9 and 30.3 5. Mild thrombocytopenia-platelets yesterday 112,000 6. History of hypothyroidism-continue Levothyroxine 200 mcg daily 7. Discharge  Lelon Huh [facility] 04/11/2020,7:03 AM    Chart reviewed, patient examined, agree with above. She looks good and maintaining sinus rhythm. Plan home today.

## 2020-04-12 ENCOUNTER — Other Ambulatory Visit: Payer: Self-pay

## 2020-04-12 ENCOUNTER — Ambulatory Visit (INDEPENDENT_AMBULATORY_CARE_PROVIDER_SITE_OTHER): Payer: 59 | Admitting: General Practice

## 2020-04-12 DIAGNOSIS — Z7901 Long term (current) use of anticoagulants: Secondary | ICD-10-CM

## 2020-04-12 DIAGNOSIS — D6851 Activated protein C resistance: Secondary | ICD-10-CM | POA: Diagnosis not present

## 2020-04-12 DIAGNOSIS — Z86711 Personal history of pulmonary embolism: Secondary | ICD-10-CM | POA: Diagnosis not present

## 2020-04-12 LAB — POCT INR: INR: 1.7 — AB (ref 2.0–3.0)

## 2020-04-12 NOTE — Progress Notes (Signed)
Medical treatment/procedure(s) were performed by non-physician practitioner and as supervising physician I was immediately available for consultation/collaboration. I agree with above. Donovan Persley A Allura Doepke, MD  

## 2020-04-12 NOTE — Patient Instructions (Addendum)
Pre visit review using our clinic review tool, if applicable. No additional management support is needed unless otherwise documented below in the visit note.  Decrease dosage to 1 1/2 tablets daily until next visit due to amiodarone.  Re-check in 1 week.   Pt started taking amiodarone and will take 2 tablets daily until 3/19.  *Once amiodarone is decreased we will look at increasing warfarin back to prior dosage.

## 2020-04-20 ENCOUNTER — Other Ambulatory Visit: Payer: Self-pay

## 2020-04-20 ENCOUNTER — Ambulatory Visit (INDEPENDENT_AMBULATORY_CARE_PROVIDER_SITE_OTHER): Payer: Self-pay | Admitting: *Deleted

## 2020-04-20 DIAGNOSIS — Z4802 Encounter for removal of sutures: Secondary | ICD-10-CM

## 2020-04-20 NOTE — Progress Notes (Signed)
Patient arrived for nurse visit to remove sutures post-AVR 04/06/20 by Dr. Laneta Simmers. Two sutures removed with no signs or symptoms of infection noted.  Incisions well approximated.  Patient tolerated suture removal well.  Patient instructed to keep the incision site clean and dry. Patient acknowledged instructions given.  All questions answered.

## 2020-04-24 ENCOUNTER — Ambulatory Visit (INDEPENDENT_AMBULATORY_CARE_PROVIDER_SITE_OTHER): Payer: 59 | Admitting: General Practice

## 2020-04-24 ENCOUNTER — Other Ambulatory Visit: Payer: Self-pay

## 2020-04-24 DIAGNOSIS — Z86711 Personal history of pulmonary embolism: Secondary | ICD-10-CM

## 2020-04-24 DIAGNOSIS — D6851 Activated protein C resistance: Secondary | ICD-10-CM

## 2020-04-24 DIAGNOSIS — E538 Deficiency of other specified B group vitamins: Secondary | ICD-10-CM

## 2020-04-24 DIAGNOSIS — Z7901 Long term (current) use of anticoagulants: Secondary | ICD-10-CM | POA: Diagnosis not present

## 2020-04-24 LAB — POCT INR: INR: 5.5 — AB (ref 2.0–3.0)

## 2020-04-24 MED ORDER — CYANOCOBALAMIN 1000 MCG/ML IJ SOLN
1000.0000 ug | Freq: Once | INTRAMUSCULAR | Status: AC
Start: 2020-04-24 — End: 2020-04-24
  Administered 2020-04-24: 1000 ug via INTRAMUSCULAR

## 2020-04-24 NOTE — Progress Notes (Signed)
Medical screening examination/treatment/procedure(s) were performed by non-physician practitioner and as supervising physician I was immediately available for consultation/collaboration. I agree with above. Biannca Scantlin, MD   

## 2020-04-24 NOTE — Patient Instructions (Addendum)
Pre visit review using our clinic review tool, if applicable. No additional management support is needed unless otherwise documented below in the visit note.  Hold coumadin today and tomorrow and then continue to take 1 1/2 tablets daily.  Re-check in 1 week.  Patient is taking 1 (200 mg) tablet of amiodarone daily.  Re-check in 1 week.  3/19.

## 2020-04-29 ENCOUNTER — Other Ambulatory Visit: Payer: Self-pay | Admitting: Gastroenterology

## 2020-04-29 NOTE — Progress Notes (Signed)
Cardiology Office Note:    Date:  04/30/2020   ID:  [name], DOB Oct 28, 1965, MRN [id number]  PCP:  Judy Pimple, MD   [facility]  Cardiologist:  Dietrich Pates, MD   Electrophysiologist:  None       Referring MD: Judy Pimple, MD   Chief Complaint:  Hospitalization Follow-up (S/p AVR)    Patient Profile:    LENETTE RAU is a 55 y.o. female with:   Aortic stenosis   S/p bioprosthetic AVR in 04/2020 (Dr. Laneta Simmers)  Post-op atrial fibrillation >> Amiodarone   Hx of recurrent B/L pleural effusion w superinfection s/p L VATS and surgical pleurodesis (2010)  Factor V Leiden  Hx of DVT/pulmonary embolism   S/p IVC filter   Hx of intracerebral hemorrhage   SLE with nephritis (Dr. Abel Presto)  Hypertension   Hyperlipidemia   Hypothyroidism   GERD   Morbid obesity s/p gastric sleeve in 2012   Prior CV studies: Carotid US 01/20/20 Bilateral ICA 1-39  Cardiac catheterization 01/19/20 Normal coronary arteries   Echocardiogram 01/17/20 EF 65-70, no RWMA, mild to mod LVH, Gr 2 DD, normal RVSF, small pericardial effusion, trivial MR, severe AS (mean 46 mmHg)     History of Present Illness:    Ms. Monterrosa was admitted 3/4-3/9 with stage D severe symptomatic aortic stenosis and underwent bioprosthetic AVR by Dr. Laneta Simmers.  Post op course was notable for atrial fibrillation with rapid rate and she converted to normal sinus rhythm with Amiodarone.  She returns for f/u.  She is here alone. She has been doing well since DC.  She still has some chest soreness.  She takes acetaminophen to manage this.  She is increasing her walking.  She has not had significant shortness of breath.  She has not had syncope, near syncope.  She has not had orthopnea.  Her chronic leg edema is stable and at baseline.           Past Medical History:  Diagnosis Date  . Arthritis    Lupus - hands/knees  . 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    Loculated-chronic on  Left-VanTright; s/p VATS 5/10  . 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 GERD (gastroesophageal reflux disease)    on prilosec r/t gastric sleeve surgery  . HLD (hyperlipidemia)   . Iron deficiency anemia    IV dextran [place]  . Nephritis and nephropathy, not specified as acute or chronic, with unspecified pathological lesion in kidney   . Nonspecific abnormal results of liver function study   . Obesity, unspecified   . Other specified acquired hypothyroidism   . Panic disorder without agoraphobia   . Personal history of venous thrombosis and embolism 2002   during pregnancy; ?factor 5 leiden (sees heme)  . Pleural effusion 2005   c/w lupus initial w/u; recurrent Right as pred tapered off July 2011  . Sleep apnea   . 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 Unspecified essential hypertension     Current Medications: Current Meds  Medication Sig  . amiodarone (PACERONE) 200 MG tablet Take 200 mg by mouth daily.  Marland Kitchen amoxicillin (AMOXIL) 500 MG tablet Take 4 tabs (2000 mg) 30-60 min before dental procedures  . aspirin EC 81 MG EC tablet Take 1 tablet (81 mg total) by mouth daily. Swallow whole.  . bismuth subsalicylate (PEPTO BISMOL) 262 MG/15ML suspension Take  30 mLs by mouth every 6 (six) hours as needed for indigestion or diarrhea or loose stools.  . calcium carbonate (TUMS - DOSED IN MG ELEMENTAL CALCIUM) 500 MG chewable tablet Chew 1-2 tablets by mouth 3 (three) times daily as needed for indigestion or heartburn.  . cyanocobalamin (,VITAMIN B-12,) 1000 MCG/ML injection Inject 1,000 mcg into the muscle every 30 (thirty) days.   . hydroxychloroquine (PLAQUENIL) 200 MG tablet Take 200 mg by mouth 2 (two) times daily.  Marland Kitchen levothyroxine (SYNTHROID) 200 MCG tablet TAKE 1 TABLET (200 MCG TOTAL) BY MOUTH DAILY BEFORE BREAKFAST.  . metoprolol tartrate (LOPRESSOR) 50 MG tablet Take 1 tablet (50 mg total) by mouth 2 (two) times daily.   . Multiple Vitamins-Minerals (BARIATRIC MULTIVITAMINS/IRON) CAPS Take 1 tablet by mouth daily.  Marland Kitchen omeprazole (PRILOSEC) 20 MG capsule TAKE 1 CAPSULE (20 MG TOTAL) BY MOUTH 2 (TWO) TIMES DAILY BEFORE A MEAL.  Marland Kitchen PARoxetine (PAXIL) 40 MG tablet TAKE 1 TABLET BY MOUTH EVERY DAY IN THE MORNING  . rosuvastatin (CRESTOR) 20 MG tablet TAKE 1 TABLET BY MOUTH EVERY DAY  . traMADol (ULTRAM) 50 MG tablet Take 1 tablet (50 mg total) by mouth every 6 (six) hours as needed for moderate pain.  Marland Kitchen warfarin (COUMADIN) 7.5 MG tablet Daily or as directed     Allergies:   Lisinopril, Lovenox [enoxaparin sodium], Moxifloxacin, and Lovenox [enoxaparin]   Social History   Tobacco Use  . Smoking status: Former Smoker    Packs/day: 0.50    Years: 10.00    Pack years: 5.00    Types: Cigarettes    Quit date: 05/04/2009    Years since quitting: 10.9  . Smokeless tobacco: Never Used  . Tobacco comment: Socially x10 years (1 pack/month)  Vaping Use  . Vaping Use: Never used  Substance Use Topics  . Alcohol use: Not Currently    Alcohol/week: 0.0 standard drinks    Comment: rare  . Drug use: No     Family Hx: [name] family history includes Diabetes in an other family member; Hyperlipidemia in her father; Hypertension in her father; Leukemia in her sister; Lupus in her sister. There is no history of Colon cancer, Esophageal cancer, or Pancreatic cancer.  ROS   EKGs/Labs/Other Test Reviewed:    EKG:  EKG is  ordered today.  The ekg ordered today demonstrates normal sinus rhythm, HR 66, normal axis, TW inversions aVL, V4-6, QTc 461 ms  Recent Labs: 01/18/2020: TSH 0.420 04/05/2020: ALT 23 04/07/2020: Magnesium 2.2 04/09/2020: BUN 18; Creatinine, Ser 1.02; Hemoglobin 9.0; Platelets 112; Potassium 4.3; Sodium 136   Recent Lipid Panel Lab Results  Component Value Date/Time   CHOL 206 (H) 01/04/2019 04:15 PM   TRIG 124.0 01/04/2019 04:15 PM   HDL 49.50 01/04/2019 04:15 PM   CHOLHDL 4 01/04/2019 04:15 PM    LDLCALC 131 (H) 01/04/2019 04:15 PM   LDLDIRECT 203.7 06/03/2011 02:33 PM      Risk Assessment/Calculations:    CHA2DS2-VASc Score = 2  This indicates a 2.2% annual risk of stroke. [name] score is based upon: CHF History: No HTN History: Yes Diabetes History: No Stroke History: No Vascular Disease History: No Age Score: 0 Gender Score: 1      Physical Exam:    VS:  BP 118/80   Pulse 66   Ht 5\' 5"  (1.651 m)   Wt (!) 306 lb 3.2 oz (138.9 kg)   LMP 11/08/2010   SpO2 97%   BMI 50.95 kg/m  Wt Readings from Last 3 Encounters:  04/30/20 (!) 306 lb 3.2 oz (138.9 kg)  04/11/20 (!) 316 lb 11.2 oz (143.7 kg)  04/05/20 (!) 318 lb 4.8 oz (144.4 kg)     Constitutional:      Appearance: Healthy appearance. Not in distress.  Neck:     Vascular: No JVR. JVD normal.  Pulmonary:     Effort: Pulmonary effort is normal.     Breath sounds: No wheezing. No rales.  Cardiovascular:     Normal rate. Regular rhythm. Normal S1. Normal S2.     Murmurs: There is a grade 2/6 crescendo-decrescendo systolic murmur at the URSB.     Comments: Median sternotomy wound well healed without erythema or d/c Edema:    Pretibial: bilateral 1+ edema of the pretibial area. Abdominal:     Palpations: Abdomen is soft. There is no hepatomegaly.  Skin:    General: Skin is warm and dry.  Neurological:     Mental Status: Alert and oriented to person, place and time.     Cranial Nerves: Cranial nerves are intact.          ASSESSMENT & PLAN:    1. Nonrheumatic aortic valve stenosis 2. S/P AVR (aortic valve replacement) She is progressing well since her AVR. She sees Dr. Laneta Simmers for f/u 4/13 and has her f/u echocardiogram planned for 4/18.  We discussed the need for SBE prophylaxis for dental procedures.  I will send in a Rx for Amoxicillin so she has this on hand for the future. She should be able to start cardiac rehabilitation after seeing Dr. Laneta Simmers for f/u.  F/u with Dr. Tenny Craw in June as  planned.   3. Postoperative atrial fibrillation (HCC) Maintaining normal sinus rhythm. She will need to remain on Amiodarone for 3 mos post op.  We should be able to stop this at f/u.    4. Factor V Leiden [facility]) She is on chronic anticoagulation and this is followed by primary care.    5. Essential hypertension [name] blood pressure is controlled on her current regimen.  Continue current therapy.    6. 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HCC) She is followed by nephrology.  Recent SCr was 1.02.      Dispo:  Return in 2 months (on 07/06/2020) for Scheduled Follow Up, w/ Dr. Tenny Craw.   Medication Adjustments/Labs and Tests Ordered: Current medicines are reviewed at length with the patient today.  Concerns regarding medicines are outlined above.  Tests Ordered: Orders Placed This Encounter  Procedures  . EKG 12-Lead   Medication Changes: Meds ordered this encounter  Medications  . amoxicillin (AMOXIL) 500 MG tablet    Sig: Take 4 tabs (2000 mg) 30-60 min before dental procedures    Dispense:  4 tablet    Refill:  1    Pt will call when ready to fill    Order Specific Question:   Supervising Provider    Answer:   Lewayne Bunting [[id number]]    Signed, Tereso Newcomer, PA-C  04/30/2020 11:53 AM    [facility] [address] [place], [place], [place]  [address] Phone: [phone]; Fax: [phone]

## 2020-04-30 ENCOUNTER — Ambulatory Visit (INDEPENDENT_AMBULATORY_CARE_PROVIDER_SITE_OTHER): Payer: 59 | Admitting: Physician Assistant

## 2020-04-30 ENCOUNTER — Other Ambulatory Visit: Payer: Self-pay

## 2020-04-30 ENCOUNTER — Encounter: Payer: Self-pay | Admitting: Physician Assistant

## 2020-04-30 VITALS — BP 118/80 | HR 66 | Ht 65.0 in | Wt 306.2 lb

## 2020-04-30 DIAGNOSIS — Z952 Presence of prosthetic heart valve: Secondary | ICD-10-CM | POA: Diagnosis not present

## 2020-04-30 DIAGNOSIS — I350 Nonrheumatic aortic (valve) stenosis: Secondary | ICD-10-CM

## 2020-04-30 DIAGNOSIS — I9789 Other postprocedural complications and disorders of the circulatory system, not elsewhere classified: Secondary | ICD-10-CM

## 2020-04-30 DIAGNOSIS — I4891 Unspecified atrial fibrillation: Secondary | ICD-10-CM

## 2020-04-30 DIAGNOSIS — D6851 Activated protein C resistance: Secondary | ICD-10-CM | POA: Diagnosis not present

## 2020-04-30 DIAGNOSIS — M3214 Glomerular disease in systemic lupus erythematosus: Secondary | ICD-10-CM

## 2020-04-30 DIAGNOSIS — I10 Essential (primary) hypertension: Secondary | ICD-10-CM

## 2020-04-30 MED ORDER — AMOXICILLIN 500 MG PO TABS: ORAL_TABLET | ORAL | 1 refills | Status: DC

## 2020-04-30 NOTE — Patient Instructions (Signed)
Medication Instructions:  Your physician recommends that you continue on your current medications as directed. Please refer to the Current Medication list given to you today.  *If you need a refill on your cardiac medications before your next appointment, please call your pharmacy*   Lab Work: None ordered  If you have labs (blood work) drawn today and your tests are completely normal, you will receive your results only by: Marland Kitchen MyChart Message (if you have MyChart) OR . A paper copy in the mail If you have any lab test that is abnormal or we need to change your treatment, we will call you to review the results.   Testing/Procedures: None ordered   Follow-Up: At [facility], you and your health needs are our priority.  As part of our continuing mission to provide you with exceptional heart care, we have created designated Provider Care Teams.  These Care Teams include your primary Cardiologist (physician) and Advanced Practice Providers (APPs -  Physician Assistants and Nurse Practitioners) who all work together to provide you with the care you need, when you need it.  We recommend signing up for the patient portal called "MyChart".  Sign up information is provided on this After Visit Summary.  MyChart is used to connect with patients for Virtual Visits (Telemedicine).  Patients are able to view lab/test results, encounter notes, upcoming appointments, etc.  Non-urgent messages can be sent to your provider as well.   To learn more about what you can do with MyChart, go to [email or web address].    Your next appointment:   In June as scheduled  The format for your next appointment:   In Person  Provider:   Dietrich Pates, MD   Other Instructions

## 2020-05-03 ENCOUNTER — Ambulatory Visit (INDEPENDENT_AMBULATORY_CARE_PROVIDER_SITE_OTHER): Payer: 59 | Admitting: General Practice

## 2020-05-03 ENCOUNTER — Other Ambulatory Visit: Payer: Self-pay | Admitting: Physician Assistant

## 2020-05-03 ENCOUNTER — Other Ambulatory Visit: Payer: Self-pay

## 2020-05-03 DIAGNOSIS — D6851 Activated protein C resistance: Secondary | ICD-10-CM

## 2020-05-03 DIAGNOSIS — Z86711 Personal history of pulmonary embolism: Secondary | ICD-10-CM

## 2020-05-03 DIAGNOSIS — Z7901 Long term (current) use of anticoagulants: Secondary | ICD-10-CM

## 2020-05-03 LAB — POCT INR: INR: 5.1 — AB (ref 2.0–3.0)

## 2020-05-03 NOTE — Progress Notes (Signed)
Medical screening examination/treatment/procedure(s) were performed by non-physician practitioner and as supervising physician I was immediately available for consultation/collaboration. I agree with above. Kamel Haven, MD   

## 2020-05-03 NOTE — Patient Instructions (Signed)
Pre visit review using our clinic review tool, if applicable. No additional management support is needed unless otherwise documented below in the visit note.  Hold dosage today and tomorrow (3/31 and 4/1) and then change dosage and take 1 1/2 tablets daily except take 1 tablet on Monday Wed and Friday.  Re-check in 12 days.    Re-check in 1 week.  Patient is taking 1 (200 mg) tablet of amiodarone daily.

## 2020-05-15 ENCOUNTER — Other Ambulatory Visit: Payer: Self-pay

## 2020-05-15 ENCOUNTER — Ambulatory Visit (INDEPENDENT_AMBULATORY_CARE_PROVIDER_SITE_OTHER): Payer: 59 | Admitting: General Practice

## 2020-05-15 DIAGNOSIS — E538 Deficiency of other specified B group vitamins: Secondary | ICD-10-CM

## 2020-05-15 DIAGNOSIS — Z86711 Personal history of pulmonary embolism: Secondary | ICD-10-CM

## 2020-05-15 DIAGNOSIS — D6851 Activated protein C resistance: Secondary | ICD-10-CM | POA: Diagnosis not present

## 2020-05-15 DIAGNOSIS — Z7901 Long term (current) use of anticoagulants: Secondary | ICD-10-CM | POA: Diagnosis not present

## 2020-05-15 LAB — POCT INR: INR: 3.1 — AB (ref 2.0–3.0)

## 2020-05-15 MED ORDER — CYANOCOBALAMIN 1000 MCG/ML IJ SOLN
1000.0000 ug | Freq: Once | INTRAMUSCULAR | Status: AC
  Administered 2020-05-15: 1000 ug via INTRAMUSCULAR

## 2020-05-15 NOTE — Patient Instructions (Addendum)
Pre visit review using our clinic review tool, if applicable. No additional management support is needed unless otherwise documented below in the visit note.  Continue to take 1 1/2 tablets daily except take 1 tablet on Monday Wed and Friday.  Re-check in 4 days.   Re-check in 4 weeks.

## 2020-05-15 NOTE — Progress Notes (Signed)
Medical screening examination/treatment/procedure(s) were performed by non-physician practitioner and as supervising physician I was immediately available for consultation/collaboration. I agree with above. Byrne Capek, MD   

## 2020-05-16 ENCOUNTER — Other Ambulatory Visit: Payer: Self-pay | Admitting: Surgery

## 2020-05-16 ENCOUNTER — Ambulatory Visit: Payer: 59 | Admitting: Surgery

## 2020-05-16 DIAGNOSIS — Z953 Presence of xenogenic heart valve: Secondary | ICD-10-CM

## 2020-05-17 ENCOUNTER — Encounter: Payer: Self-pay | Admitting: Surgery

## 2020-05-17 ENCOUNTER — Ambulatory Visit
Admission: RE | Admit: 2020-05-17 | Discharge: 2020-05-17 | Disposition: A | Discharge status: Home / Self Care | Payer: 59 | Source: Ambulatory Visit | Attending: Surgery | Admitting: Surgery

## 2020-05-17 ENCOUNTER — Other Ambulatory Visit: Payer: Self-pay

## 2020-05-17 ENCOUNTER — Ambulatory Visit (INDEPENDENT_AMBULATORY_CARE_PROVIDER_SITE_OTHER): Payer: Self-pay | Admitting: Surgery

## 2020-05-17 VITALS — BP 110/78 | HR 60 | Temp 97.7°F | Resp 20 | Ht 65.0 in | Wt 302.3 lb

## 2020-05-17 DIAGNOSIS — Z953 Presence of xenogenic heart valve: Secondary | ICD-10-CM

## 2020-05-17 NOTE — Progress Notes (Signed)
HPI: Patient returns for routine postoperative follow-up having undergone aortic valve replacement using a 21 mm Edwards pericardial valve on 04/06/2020. [name] early postoperative recovery while in the hospital was notable for an uncomplicated postoperative course.  She did develop postoperative atrial fibrillation and was converted with amiodarone. Since hospital discharge the patient reports that she has been feeling well.  She has walking daily without chest pain or shortness of breath.  Her stamina is improving.   Current Outpatient Medications  Medication Sig Dispense Refill  . amiodarone (PACERONE) 200 MG tablet Take 200 mg by mouth daily.    Marland Kitchen amoxicillin (AMOXIL) 500 MG tablet Take 4 tabs (2000 mg) 30-60 min before dental procedures 4 tablet 1  . aspirin EC 81 MG EC tablet Take 1 tablet (81 mg total) by mouth daily. Swallow whole. 30 tablet 11  . bismuth subsalicylate (PEPTO BISMOL) 262 MG/15ML suspension Take 30 mLs by mouth every 6 (six) hours as needed for indigestion or diarrhea or loose stools.    . calcium carbonate (TUMS - DOSED IN MG ELEMENTAL CALCIUM) 500 MG chewable tablet Chew 1-2 tablets by mouth 3 (three) times daily as needed for indigestion or heartburn.    . cyanocobalamin (,VITAMIN B-12,) 1000 MCG/ML injection Inject 1,000 mcg into the muscle every 30 (thirty) days.     . hydroxychloroquine (PLAQUENIL) 200 MG tablet Take 200 mg by mouth 2 (two) times daily.    Marland Kitchen levothyroxine (SYNTHROID) 200 MCG tablet TAKE 1 TABLET (200 MCG TOTAL) BY MOUTH DAILY BEFORE BREAKFAST. 90 tablet 1  . metoprolol tartrate (LOPRESSOR) 50 MG tablet Take 1 tablet (50 mg total) by mouth 2 (two) times daily. 60 tablet 1  . Multiple Vitamins-Minerals (BARIATRIC MULTIVITAMINS/IRON) CAPS Take 1 tablet by mouth daily.    Marland Kitchen omeprazole (PRILOSEC) 20 MG capsule TAKE 1 CAPSULE (20 MG TOTAL) BY MOUTH 2 (TWO) TIMES DAILY BEFORE A MEAL. 180 capsule 1  . PARoxetine (PAXIL) 40 MG tablet TAKE 1 TABLET  BY MOUTH EVERY DAY IN THE MORNING 90 tablet 3  . rosuvastatin (CRESTOR) 20 MG tablet TAKE 1 TABLET BY MOUTH EVERY DAY 90 tablet 3  . traMADol (ULTRAM) 50 MG tablet Take 1 tablet (50 mg total) by mouth every 6 (six) hours as needed for moderate pain. 28 tablet 0  . warfarin (COUMADIN) 7.5 MG tablet Daily or as directed 145 tablet 0   No current facility-administered medications for this visit.    Physical Exam: BP 110/78 (BP Location: Left Arm, Patient Position: Sitting, Cuff Size: Large)   Pulse 60   Temp 97.7 F (36.5 C) (Skin)   Resp 20   Ht 5\' 5"  (1.651 m)   Wt (!) 302 lb 4.8 oz (137.1 kg)   LMP 11/08/2010   SpO2 93% Comment: RA  BMI 50.31 kg/m  She looks well. Cardiac exam shows a regular rate and rhythm with a 1/6 systolic murmur along the right sternal border.  There is no diastolic murmur. Lungs are clear. Chest incision is healing well and sternum is stable. There is no peripheral edema.  Diagnostic Tests:  Narrative & Impression  CLINICAL DATA:  55 year old female status post aortic valve replacement last month. History of chronic left lung empyema, fibrothorax.  EXAM: CHEST - 2 VIEW  COMPARISON:  Chest radiographs 04/09/2020 and earlier, including chest CTA 04/03/2019.  FINDINGS: Improved lung volumes and bibasilar ventilation since 04/09/2020. Chronic calcified left lung pleural collection is unchanged. Prosthetic aortic valve. Mediastinal contour within normal limits. No  pneumothorax, pulmonary edema, pleural effusion or acute pulmonary opacity.  Stable sternotomy. No acute osseous abnormality identified. Negative visible bowel gas pattern.  IMPRESSION: Satisfactory postoperative appearance of the chest. Chronic left lung calcified pleural collection. No acute cardiopulmonary abnormality.   Electronically Signed   By: Odessa Fleming M.D.   On: 05/17/2020 10:07      Impression:  Overall she is doing very well following aortic valve  replacement surgery.  I encouraged her to continue walking as much as possible.  She is planning on participating in cardiac rehab.  I told her she could drive a car but should refrain from lifting anything heavier than 10 pounds for 3 months postoperatively.  Cardiology is planning on discontinuing her amiodarone after 3 months.  Plan:  She will continue to follow-up with Dr. Milinda Antis and Dr. Tenny Craw.  She will return to see me if she has any problems with her incisions.   Alleen Borne, MD [facility] [phone]

## 2020-05-21 ENCOUNTER — Ambulatory Visit (HOSPITAL_COMMUNITY): Payer: 59 | Attending: Cardiology

## 2020-05-21 ENCOUNTER — Encounter: Payer: Self-pay | Admitting: Physician Assistant

## 2020-05-21 ENCOUNTER — Other Ambulatory Visit: Payer: Self-pay

## 2020-05-21 ENCOUNTER — Other Ambulatory Visit: Payer: Self-pay | Admitting: Physician Assistant

## 2020-05-21 DIAGNOSIS — Z952 Presence of prosthetic heart valve: Secondary | ICD-10-CM

## 2020-05-21 LAB — ECHOCARDIOGRAM COMPLETE
AR max vel: 1.42 cm2
AV Area VTI: 1.57 cm2
AV Area mean vel: 1.47 cm2
AV Mean grad: 17 mmHg
AV Peak grad: 31.5 mmHg
Ao pk vel: 2.81 m/s
Area-P 1/2: 3.37 cm2
S' Lateral: 2.6 cm

## 2020-05-25 ENCOUNTER — Telehealth: Payer: Self-pay

## 2020-05-25 NOTE — Telephone Encounter (Signed)
STD form completed and faxed to [employer] group benefits @ [phone]. Beginning leave 04/06/20 through approx. RTW date 07/02/20.

## 2020-06-03 ENCOUNTER — Other Ambulatory Visit: Payer: Self-pay | Admitting: Physician Assistant

## 2020-06-04 ENCOUNTER — Other Ambulatory Visit: Payer: Self-pay | Admitting: Physician Assistant

## 2020-06-04 ENCOUNTER — Other Ambulatory Visit: Payer: Self-pay | Admitting: *Deleted

## 2020-06-04 MED ORDER — METOPROLOL TARTRATE 50 MG PO TABS: 50.0000 mg | ORAL_TABLET | Freq: Two times a day (BID) | ORAL | 3 refills | Status: DC

## 2020-06-12 ENCOUNTER — Other Ambulatory Visit: Payer: Self-pay | Admitting: General Practice

## 2020-06-12 ENCOUNTER — Ambulatory Visit (INDEPENDENT_AMBULATORY_CARE_PROVIDER_SITE_OTHER): Payer: 59 | Admitting: General Practice

## 2020-06-12 ENCOUNTER — Other Ambulatory Visit: Payer: Self-pay

## 2020-06-12 DIAGNOSIS — D6851 Activated protein C resistance: Secondary | ICD-10-CM

## 2020-06-12 DIAGNOSIS — Z7901 Long term (current) use of anticoagulants: Secondary | ICD-10-CM | POA: Diagnosis not present

## 2020-06-12 DIAGNOSIS — E538 Deficiency of other specified B group vitamins: Secondary | ICD-10-CM

## 2020-06-12 DIAGNOSIS — Z86711 Personal history of pulmonary embolism: Secondary | ICD-10-CM

## 2020-06-12 LAB — POCT INR: INR: 1.8 — AB (ref 2.0–3.0)

## 2020-06-12 MED ORDER — CYANOCOBALAMIN 1000 MCG/ML IJ SOLN
1000.0000 ug | Freq: Once | INTRAMUSCULAR | Status: AC
  Administered 2020-06-12: 1000 ug via INTRAMUSCULAR

## 2020-06-12 NOTE — Patient Instructions (Signed)
Pre visit review using our clinic review tool, if applicable. No additional management support is needed unless otherwise documented below in the visit note.  Take extra 1/2 tablet today, tomorrow and Thursday.  On Friday continue to take 1 1/2 tablets daily except take 1 tablet on Monday Wed and Friday.  Re-check in 3 weeks.

## 2020-06-12 NOTE — Progress Notes (Signed)
Medical screening examination/treatment/procedure(s) were performed by non-physician practitioner and as supervising physician I was immediately available for consultation/collaboration. I agree with above. Sueko Dimichele, MD   

## 2020-06-18 ENCOUNTER — Ambulatory Visit (INDEPENDENT_AMBULATORY_CARE_PROVIDER_SITE_OTHER): Payer: 59 | Admitting: Family Medicine

## 2020-06-18 ENCOUNTER — Other Ambulatory Visit: Payer: Self-pay

## 2020-06-18 ENCOUNTER — Encounter: Payer: Self-pay | Admitting: Family Medicine

## 2020-06-18 VITALS — BP 116/72 | HR 68 | Temp 96.0°F | Ht 65.0 in | Wt 309.5 lb

## 2020-06-18 DIAGNOSIS — I10 Essential (primary) hypertension: Secondary | ICD-10-CM | POA: Diagnosis not present

## 2020-06-18 DIAGNOSIS — E890 Postprocedural hypothyroidism: Secondary | ICD-10-CM | POA: Diagnosis not present

## 2020-06-18 DIAGNOSIS — D509 Iron deficiency anemia, unspecified: Secondary | ICD-10-CM | POA: Insufficient documentation

## 2020-06-18 DIAGNOSIS — D508 Other iron deficiency anemias: Secondary | ICD-10-CM

## 2020-06-18 DIAGNOSIS — Z8679 Personal history of other diseases of the circulatory system: Secondary | ICD-10-CM

## 2020-06-18 DIAGNOSIS — I350 Nonrheumatic aortic (valve) stenosis: Secondary | ICD-10-CM

## 2020-06-18 DIAGNOSIS — R5382 Chronic fatigue, unspecified: Secondary | ICD-10-CM

## 2020-06-18 DIAGNOSIS — J301 Allergic rhinitis due to pollen: Secondary | ICD-10-CM

## 2020-06-18 NOTE — Assessment & Plan Note (Signed)
Likely multi factorial  Cbc, iron studies and tsh today On amiodarone now  Recent aortic valve replacement

## 2020-06-18 NOTE — Assessment & Plan Note (Signed)
In pt with h/o bariatric surgery (also b12 def)  Also h/o renal dz from SLE Recent valve replacement  Check cbc and iron studies today  Taking mvi with iron and ferrous sulfate once daily  May need iron infusion if lower (has had in distant past)

## 2020-06-18 NOTE — Patient Instructions (Addendum)
Zyrtec 10 mg daily is ok to take for allergies  flonase is ok also  Let me know it this help   Wear mask outside if you have to   Labs today for anemia and thyroid

## 2020-06-18 NOTE — Progress Notes (Signed)
Subjective:    Patient ID: [name], female    DOB: 10-08-1965, 55 y.o.   MRN: [id number]  This visit occurred during the SARS-CoV-2 public health emergency.  Safety protocols were in place, including screening questions prior to the visit, additional usage of staff PPE, and extensive cleaning of exam room while observing appropriate contact time as indicated for disinfecting solutions.    HPI Pt presents to discuss anemia  Also seasonal allergies   Wt Readings from Last 3 Encounters:  06/18/20 (!) 309 lb 8 oz (140.4 kg)  05/17/20 (!) 302 lb 4.8 oz (137.1 kg)  04/30/20 (!) 306 lb 3.2 oz (138.9 kg)   51.50 kg/m  Outside over the weekend and then developed runny/itchy nose and eyes  Eyes are puffy  Clear mucous  No cough    anemia  Takes some iron supplements 1 ferrous sulfate  Sometimes does not tolerate and vomits  Takes mvi for bariatric - it has iron in it   Has had IV iron 3-4 times  Her renal doctor  Feeling tired   Lupus has been stable  Taking plaquenil   Lab Results  Component Value Date   WBC 7.6 04/09/2020   HGB 9.0 (L) 04/09/2020   HCT 30.3 (L) 04/09/2020   MCV 80.6 04/09/2020   PLT 112 (L) 04/09/2020   Lab Results  Component Value Date   IRON 33 01/19/2020   TIBC 349 01/19/2020   FERRITIN 28 01/19/2020    Had recent heart valve surgery complicated by afib and now taking  amiodarong  H/o bariatric surgery   Lab Results  Component Value Date   TSH 0.420 01/18/2020    On amiodarone now   Patient Active Problem List   Diagnosis Date Noted  . Iron deficiency anemia 06/18/2020  . History of atrial fibrillation 06/18/2020  . S/P aortic valve replacement with bioprosthetic valve 04/06/2020  . Coronary artery disease 04/06/2020  . Empyema lung ([place]) 01/17/2020  . Chest pain of uncertain etiology 81/85/6314  . Esophageal diverticulum   . Esophageal dysphagia   . Fatigue 05/20/2019  . Systemic lupus erythematosus with lung involvement  ([place]) 01/04/2019  . Aortic stenosis 08/15/2017  . Vitamin B12 deficiency 11/15/2015  . Nausea & vomiting 10/15/2015  . Allergic rhinitis 07/01/2015  . History of cerebral hemorrhage 07/01/2015  . Chronic daily headache 06/29/2015  . Screening for osteoporosis 11/04/2013  . Encounter for therapeutic drug monitoring 03/25/2013  . CKD (chronic kidney disease) stage 2, GFR 60-89 ml/min 12/20/2012  . 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[place]) 12/20/2012  . Routine general medical examination at a health care facility 12/07/2012  . Steroid long-term use 12/07/2012  . History of HPV infection 12/07/2012  . Long term (current) use of anticoagulants 10/06/2012  . Chronic anticoagulation 04/22/2012  . S/P bariatric surgery 04/22/2012  . Factor V Leiden ([place]) 04/22/2012  . OSA on CPAP 11/20/2011  . Prediabetes 09/16/2010  . EDEMA 01/08/2010  . PLEURAL EFFUSION 08/27/2009  . ENLARGEMENT OF LYMPH NODES 08/13/2009  . History of pulmonary embolism 05/24/2009  . Morbid obesity ([place]) 12/08/2007  . Hypothyroidism 06/09/2006  . HYPERCHOLESTEROLEMIA 06/09/2006  . Generalized anxiety disorder 06/09/2006  . PANIC DISORDER 06/09/2006  . Essential hypertension 06/09/2006  . GLOMERULONEPHRITIS 06/09/2006  . Systemic lupus erythematosus ([place]) 06/09/2006  . DVT, HX OF 06/09/2006   Past Medical History:  Diagnosis Date  . Aortic stenosis    s/p AVR // Echocardiogram 4/22: EF 60-65, no RWMA, mild LVH, Gr 2 DD,  normal RVSF, RVSP 22.4, mod MAC, AVR with mean 17 mmHg  . Arthritis    Lupus - hands/knees  . 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 GERD (gastroesophageal reflux disease)    on prilosec r/t gastric sleeve surgery  . HLD (hyperlipidemia)   . Iron deficiency anemia    IV dextran [place]  . Nephritis and nephropathy, not specified as acute or chronic, with unspecified pathological lesion in kidney   .  Nonspecific abnormal results of liver function study   . Obesity, unspecified   . Other specified acquired hypothyroidism   . Panic disorder without agoraphobia   . Personal history of venous thrombosis and embolism 2002   during pregnancy; ?factor 5 leiden (sees heme)  . Pleural effusion 2005   c/w lupus initial w/u; recurrent Right as pred tapered off July 2011  . Sleep apnea   . 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 Unspecified essential hypertension    Past Surgical History:  Procedure Laterality Date  . AORTIC VALVE REPLACEMENT N/A 04/06/2020   Procedure: AORTIC VALVE REPLACEMENT (AVR) USING INSPIRIS 21 MM AORTIC VALVE;  Surgeon: [name], MD;  Location: [facility];  Service: Open Heart Surgery;  Laterality: N/A;  . BIOPSY  04/12/2019   Procedure: BIOPSY;  Surgeon: Thornton Park, MD;  Location: [facility];  Service: Gastroenterology;;  . ESOPHAGEAL MANOMETRY N/A 07/15/2019   Procedure: ESOPHAGEAL MANOMETRY (EM);  Surgeon: Mauri Pole, MD;  Location: [facility];  Service: Endoscopy;  Laterality: N/A;  . ESOPHAGOGASTRODUODENOSCOPY (EGD) WITH PROPOFOL N/A 04/12/2019   Procedure: ESOPHAGOGASTRODUODENOSCOPY (EGD) WITH PROPOFOL;  Surgeon: Thornton Park, MD;  Location: [facility];  Service: Gastroenterology;  Laterality: N/A;  . gastric sleeve  8/12   bariatric surgery Dr Evorn Gong   . LAPAROSCOPIC TUBAL LIGATION  12/09/2010   Procedure: LAPAROSCOPIC TUBAL LIGATION;  Surgeon: Darlyn Chamber;  Location: [facility];  Service: Gynecology;  Laterality: Bilateral;  Attempted see Nursing note  . pleurocentesis  10/2004  . Pleuryx catheter placement  9/11    VanTright----removed 9/11  . RENAL BIOPSY  09/24/2012  . RIGHT/LEFT HEART CATH AND CORONARY ANGIOGRAPHY N/A 01/19/2020   Procedure: RIGHT/LEFT HEART CATH AND CORONARY ANGIOGRAPHY;  Surgeon: Lorretta Harp, MD;  Location: [facility];  Service: Cardiovascular;  Laterality:  N/A;  . svd     x 3  . TEE WITHOUT CARDIOVERSION N/A 04/06/2020   Procedure: TRANSESOPHAGEAL ECHOCARDIOGRAM (TEE);  Surgeon: [name], MD;  Location: [facility];  Service: Open Heart Surgery;  Laterality: N/A;  . THORACENTESIS  2010   with penumonia  . WISDOM TOOTH EXTRACTION     Social History   Tobacco Use  . Smoking status: Former Smoker    Packs/day: 0.50    Years: 10.00    Pack years: 5.00    Types: Cigarettes    Quit date: 05/04/2009    Years since quitting: 11.1  . Smokeless tobacco: Never Used  . Tobacco comment: Socially x10 years (1 pack/month)  Vaping Use  . Vaping Use: Never used  Substance Use Topics  . Alcohol use: Not Currently    Alcohol/week: 0.0 standard drinks    Comment: rare  . Drug use: No   Family History  Problem Relation Age of Onset  . Lupus Sister   . Hypertension  Father   . Hyperlipidemia Father   . Leukemia Sister   . Diabetes Other        GM  . Colon cancer Neg Hx   . Esophageal cancer Neg Hx   . Pancreatic cancer Neg Hx    Allergies  Allergen Reactions  . Lisinopril Cough  . Lovenox [Enoxaparin Sodium] Itching  . Moxifloxacin Other (See Comments)    REACTION: hallucinations  . Lovenox [Enoxaparin]     Itching    Current Outpatient Medications on File Prior to Visit  Medication Sig Dispense Refill  . amiodarone (PACERONE) 200 MG tablet Take 200 mg by mouth daily.    Marland Kitchen amoxicillin (AMOXIL) 500 MG tablet Take 4 tabs (2000 mg) 30-60 min before dental procedures 4 tablet 1  . aspirin EC 81 MG EC tablet Take 1 tablet (81 mg total) by mouth daily. Swallow whole. 30 tablet 11  . bismuth subsalicylate (PEPTO BISMOL) 262 MG/15ML suspension Take 30 mLs by mouth every 6 (six) hours as needed for indigestion or diarrhea or loose stools.    . calcium carbonate (TUMS - DOSED IN MG ELEMENTAL CALCIUM) 500 MG chewable tablet Chew 1-2 tablets by mouth 3 (three) times daily as needed for indigestion or heartburn.    . cyanocobalamin (,VITAMIN B-12,)  1000 MCG/ML injection Inject 1,000 mcg into the muscle every 30 (thirty) days.     . hydroxychloroquine (PLAQUENIL) 200 MG tablet Take 200 mg by mouth 2 (two) times daily.    Marland Kitchen levothyroxine (SYNTHROID) 200 MCG tablet TAKE 1 TABLET (200 MCG TOTAL) BY MOUTH DAILY BEFORE BREAKFAST. 90 tablet 1  . metoprolol tartrate (LOPRESSOR) 50 MG tablet Take 1 tablet (50 mg total) by mouth 2 (two) times daily. 180 tablet 3  . Multiple Vitamins-Minerals (BARIATRIC MULTIVITAMINS/IRON) CAPS Take 1 tablet by mouth daily.    Marland Kitchen omeprazole (PRILOSEC) 20 MG capsule TAKE 1 CAPSULE (20 MG TOTAL) BY MOUTH 2 (TWO) TIMES DAILY BEFORE A MEAL. 180 capsule 1  . PARoxetine (PAXIL) 40 MG tablet TAKE 1 TABLET BY MOUTH EVERY DAY IN THE MORNING 90 tablet 3  . rosuvastatin (CRESTOR) 20 MG tablet TAKE 1 TABLET BY MOUTH EVERY DAY 90 tablet 3  . warfarin (COUMADIN) 7.5 MG tablet Daily or as directed 145 tablet 0  . [DISCONTINUED] enoxaparin (LOVENOX) 150 MG/ML injection Inject 1 mL (150 mg total) into the skin 2 (two) times daily for 7 days. (Patient not taking: Reported on 01/26/2020) 14 mL 0   No current facility-administered medications on file prior to visit.    Review of Systems  Constitutional: Positive for fatigue. Negative for activity change, appetite change, fever and unexpected weight change.  HENT: Positive for congestion, postnasal drip, rhinorrhea and sneezing. Negative for ear pain, sinus pressure, sore throat, trouble swallowing and voice change.   Eyes: Negative for pain, redness and visual disturbance.  Respiratory: Negative for cough, shortness of breath and wheezing.   Cardiovascular: Negative for chest pain and palpitations.  Gastrointestinal: Negative for abdominal pain, blood in stool, constipation and diarrhea.  Endocrine: Negative for polydipsia and polyuria.  Genitourinary: Negative for dysuria, frequency and urgency.  Musculoskeletal: Negative for arthralgias, back pain and myalgias.  Skin: Negative for  pallor and rash.       Healing incision on chest- tingles   Allergic/Immunologic: Negative for environmental allergies.  Neurological: Negative for dizziness, syncope and headaches.  Hematological: Negative for adenopathy. Does not bruise/bleed easily.  Psychiatric/Behavioral: Negative for decreased concentration and dysphoric mood. The patient is not  nervous/anxious.        Objective:   Physical Exam Constitutional:      General: She is not in acute distress.    Appearance: Normal appearance. She is well-developed. She is obese. She is not ill-appearing or diaphoretic.  HENT:     Head: Normocephalic and atraumatic.     Nose: Congestion present.     Comments: Sniffling pnd  Eyes:     Conjunctiva/sclera: Conjunctivae normal.     Pupils: Pupils are equal, round, and reactive to light.     Comments: Conj are slightly pale  Neck:     Thyroid: No thyromegaly.     Vascular: No carotid bruit or JVD.  Cardiovascular:     Rate and Rhythm: Normal rate and regular rhythm.     Heart sounds: Murmur heard.  No gallop.      Comments: Systolic M Pulmonary:     Effort: Pulmonary effort is normal. No respiratory distress.     Breath sounds: Normal breath sounds. No wheezing or rales.  Abdominal:     General: Bowel sounds are normal. There is no distension or abdominal bruit.     Palpations: Abdomen is soft. There is no mass.     Tenderness: There is no abdominal tenderness.  Musculoskeletal:     Cervical back: Normal range of motion and neck supple.     Right lower leg: No edema.     Left lower leg: No edema.  Lymphadenopathy:     Cervical: No cervical adenopathy.  Skin:    General: Skin is warm and dry.     Coloration: Skin is not pale.     Findings: No rash.     Comments: Mid line sternal incision is pink and healed  Neurological:     Mental Status: She is alert.     Cranial Nerves: No cranial nerve deficit.     Coordination: Coordination normal.     Deep Tendon Reflexes:  Reflexes are normal and symmetric.  Psychiatric:        Mood and Affect: Mood normal.           Assessment & Plan:

## 2020-06-18 NOTE — Assessment & Plan Note (Signed)
Runny nose/sneezing and eye itch when outdoors in summer  Wanted to know what is safe to take with warfarin  Recommend Zyrtec 10 mg daily  flonase prn  Wear mask outdoors if needed/allergen avoidance

## 2020-06-18 NOTE — Assessment & Plan Note (Signed)
This occurred after recent aortic valve replacement  Now on amiodarone and in nl rhythm For cardiology f/u Taking warfarin

## 2020-06-18 NOTE — Assessment & Plan Note (Signed)
Doing well s/p valve surgery/replacement  Some fatigue  Handicapped parking form done today  Had some a fib after surgery, now in NSR and still on amiodarone

## 2020-06-18 NOTE — Assessment & Plan Note (Signed)
Taking 200 mcg daily  TSH today  Taking amiodarone now-this may affect  Some fatigue

## 2020-06-19 LAB — CBC WITH DIFFERENTIAL/PLATELET
Basophils Absolute: 0.1 10*3/uL (ref 0.0–0.1)
Basophils Relative: 1.4 % (ref 0.0–3.0)
Eosinophils Absolute: 0.3 10*3/uL (ref 0.0–0.7)
Eosinophils Relative: 7.3 % — ABNORMAL HIGH (ref 0.0–5.0)
HCT: 31.5 % — ABNORMAL LOW (ref 36.0–46.0)
Hemoglobin: 9.9 g/dL — ABNORMAL LOW (ref 12.0–15.0)
Lymphocytes Relative: 35.4 % (ref 12.0–46.0)
Lymphs Abs: 1.5 10*3/uL (ref 0.7–4.0)
MCHC: 31.4 g/dL (ref 30.0–36.0)
MCV: 71.5 fl — ABNORMAL LOW (ref 78.0–100.0)
Monocytes Absolute: 0.4 10*3/uL (ref 0.1–1.0)
Monocytes Relative: 8.8 % (ref 3.0–12.0)
Neutro Abs: 2 10*3/uL (ref 1.4–7.7)
Neutrophils Relative %: 47.1 % (ref 43.0–77.0)
Platelets: 259 10*3/uL (ref 150.0–400.0)
RBC: 4.4 Mil/uL (ref 3.87–5.11)
RDW: 17.1 % — ABNORMAL HIGH (ref 11.5–15.5)
WBC: 4.2 10*3/uL (ref 4.0–10.5)

## 2020-06-19 LAB — BASIC METABOLIC PANEL
BUN: 16 mg/dL (ref 6–23)
CO2: 29 mEq/L (ref 19–32)
Calcium: 9 mg/dL (ref 8.4–10.5)
Chloride: 102 mEq/L (ref 96–112)
Creatinine, Ser: 1.05 mg/dL (ref 0.40–1.20)
GFR: 60.51 mL/min (ref >60.00)
Glucose, Bld: 86 mg/dL (ref 70–99)
Potassium: 5 mEq/L (ref 3.5–5.1)
Sodium: 138 mEq/L (ref 135–145)

## 2020-06-19 LAB — IRON: Iron: 26 ug/dL — ABNORMAL LOW (ref 42–145)

## 2020-06-19 LAB — FERRITIN: Ferritin: 17.8 ng/mL (ref 10.0–291.0)

## 2020-06-19 LAB — TSH: TSH: 0.17 u[IU]/mL — ABNORMAL LOW (ref 0.35–4.50)

## 2020-06-20 ENCOUNTER — Telehealth: Payer: Self-pay | Admitting: *Deleted

## 2020-06-20 DIAGNOSIS — D508 Other iron deficiency anemias: Secondary | ICD-10-CM

## 2020-06-20 MED ORDER — LEVOTHYROXINE SODIUM 175 MCG PO TABS: 175.0000 ug | ORAL_TABLET | Freq: Every day | ORAL | 3 refills | Status: DC

## 2020-06-20 NOTE — Telephone Encounter (Signed)
Referral done

## 2020-06-20 NOTE — Telephone Encounter (Signed)
-----   Message from Judy Pimple, MD sent at 06/19/2020  9:12 PM EDT ----- Hb is 9.9 and iron is low at 26 (despite oral iron supplementation)  I recommend ref to hematology for iron infusion -let me know if agreeable  TSH is off (may be from the amiodarone) She takes 200 mcg levothyroxine daily  Please change to 175 mcg daily #30 3 refills  Re check TSH in about a month

## 2020-06-20 NOTE — Telephone Encounter (Signed)
Pt notified of lab results and Dr. Royden Purl comments. Pt also viewed results via mychart. Rx sent to pharmacy and f/u lab appt scheduled  Pt agrees with hematologist referral and would like to see someone in [place], I advise pt PCP will put referral in and our [facility] will call to schedule. Please order

## 2020-06-25 ENCOUNTER — Telehealth: Payer: Self-pay | Admitting: Hematology and Oncology

## 2020-06-25 NOTE — Telephone Encounter (Signed)
Received a new hem referral from Dr. Milinda Antis for IDA. MS. Lafave has been cld and scheduled to see Dr. Al Pimple on 5/26 at 1pm. Pt aware to arrive 20 minutes early.

## 2020-06-26 ENCOUNTER — Encounter (HOSPITAL_COMMUNITY): Payer: Self-pay

## 2020-06-26 ENCOUNTER — Telehealth (HOSPITAL_COMMUNITY): Payer: Self-pay

## 2020-06-26 NOTE — Telephone Encounter (Signed)
Attempted to call patient in regards to Cardiac Rehab - LM on VM Mailed letter 

## 2020-06-27 ENCOUNTER — Other Ambulatory Visit: Payer: Self-pay | Admitting: Physician Assistant

## 2020-06-28 ENCOUNTER — Other Ambulatory Visit: Payer: Self-pay

## 2020-06-28 ENCOUNTER — Encounter: Payer: Self-pay | Admitting: Hematology and Oncology

## 2020-06-28 ENCOUNTER — Inpatient Hospital Stay: Payer: 59 | Attending: Hematology and Oncology | Admitting: Hematology and Oncology

## 2020-06-28 ENCOUNTER — Inpatient Hospital Stay: Payer: 59

## 2020-06-28 ENCOUNTER — Telehealth: Payer: Self-pay | Admitting: Hematology and Oncology

## 2020-06-28 DIAGNOSIS — I350 Nonrheumatic aortic (valve) stenosis: Secondary | ICD-10-CM | POA: Diagnosis not present

## 2020-06-28 DIAGNOSIS — D6851 Activated protein C resistance: Secondary | ICD-10-CM | POA: Diagnosis not present

## 2020-06-28 DIAGNOSIS — I10 Essential (primary) hypertension: Secondary | ICD-10-CM | POA: Diagnosis not present

## 2020-06-28 DIAGNOSIS — Z7982 Long term (current) use of aspirin: Secondary | ICD-10-CM | POA: Diagnosis not present

## 2020-06-28 DIAGNOSIS — E669 Obesity, unspecified: Secondary | ICD-10-CM | POA: Diagnosis not present

## 2020-06-28 DIAGNOSIS — E785 Hyperlipidemia, unspecified: Secondary | ICD-10-CM | POA: Insufficient documentation

## 2020-06-28 DIAGNOSIS — K219 Gastro-esophageal reflux disease without esophagitis: Secondary | ICD-10-CM | POA: Insufficient documentation

## 2020-06-28 DIAGNOSIS — Z79899 Other long term (current) drug therapy: Secondary | ICD-10-CM | POA: Diagnosis not present

## 2020-06-28 DIAGNOSIS — Z9884 Bariatric surgery status: Secondary | ICD-10-CM | POA: Insufficient documentation

## 2020-06-28 DIAGNOSIS — D508 Other iron deficiency anemias: Secondary | ICD-10-CM | POA: Diagnosis not present

## 2020-06-28 DIAGNOSIS — D509 Iron deficiency anemia, unspecified: Secondary | ICD-10-CM | POA: Diagnosis not present

## 2020-06-28 DIAGNOSIS — Z87891 Personal history of nicotine dependence: Secondary | ICD-10-CM | POA: Diagnosis not present

## 2020-06-28 DIAGNOSIS — Z7901 Long term (current) use of anticoagulants: Secondary | ICD-10-CM | POA: Insufficient documentation

## 2020-06-28 DIAGNOSIS — G4730 Sleep apnea, unspecified: Secondary | ICD-10-CM | POA: Diagnosis not present

## 2020-06-28 NOTE — Progress Notes (Signed)
[place] NOTE  Patient Care Team: Tower, [name], MD as PCP - General [name], MD as PCP - Cardiology (Cardiology) [name], MD (Pulmonary Disease) [name], MD as Consulting Physician (Oncology) [name], MD as Consulting Physician (Nephrology) [name], MD as Consulting Physician (Rheumatology)  CHIEF COMPLAINTS/PURPOSE OF CONSULTATION:  Iron def anemia, initial consultation  ASSESSMENT & PLAN:   Iron deficiency anemia This is a very pleasant patient with IDA,  We have discussed common causes of iron deficiency include blood loss, reduced iron absorption because of previous surgeries, dietary restrictions or other malabsorption issues, medications that reduce gastric acidity are due to inherited disorders such as IRIDA due to TMPRSS6 mutation.  We generally treat iron deficiency anemia with oral supplements if this can be tolerated.  IV iron is appropriate for patients are unable to tolerate due to gastrointestinal side effects or for patients with severe/ongoing blood loss, history of gastric bypass which reduces gastric acid and henceforth will impair intestinal absorption of oral iron, malabsorption syndromes are occasional in pregnancy. There are several formularies of intravenous iron available in the market.  We have discussed about risk of allergic/infusion reactions including potentially life-threatening anaphylaxis with intravenous iron however these serious allergic reactions are exceedingly rare and overestimated.  In contrast to serious allergic reactions, IV iron may be associated with nonallergic infusion reactions including self-limited urticaria, palpitations, dizziness, neck and back spasm which again occur in less than 1% of the individuals and do not progress to more serious reactions. We will proceed with Venofer given moderate anemia and Hb less than 10.  Homozygous Factor V Leiden mutation ([place]) 55  yr old female patient with homozygous factor V leiden mutation referred to hematology for anticoagulation recommendations. Given spontaneous brain bleed, at least two episodes of VTE, it is best she continue with coumadin as anticoagulation. Again, as with any blood thinner, there is increased risk of bleeding with coumadin but reversal agents may be more readily available. She is agreeable to the recommendations.  No orders of the defined types were placed in this encounter.    HISTORY OF PRESENTING ILLNESS:   [name] 55 y.o. female is here because of IDA.  This is a very pleasant 55 yr old female patient with iron deficiency anemia, s.p last iron infusion about 3 yrs ago, multiple DVT/PE and homozygous factor V leiden mutation, aortic stenosis status post aortic valve replacement, SLE, obesity referred to hematology for evaluation of anemia as well as given her history of homozygous factor V Leiden mutation.  Patient arrived to the appointment today by herself.  She most recently had aortic valve replacement and is recovering well.  She continues on chronic anticoagulation with Coumadin.  She was initially on Lovenox, had an allergic reaction to Lovenox which she describes as a generalized itch, so switched to Coumadin.  She had a spontaneous brain bleed about 3 years ago, currently continues on Coumadin.  She tells me that intermittently she needs intravenous iron despite being on oral iron supplementation, last iron infusion was almost 3 years ago.  She denies any blood loss per rectum, hematochezia or melena or hematuria.  No known malabsorption syndromes.  No gastric surgery. She feels well today.  No concerning complaints.  Rest of the pertinent review of systems reviewed and negative.   MEDICAL HISTORY:  Past Medical History:  Diagnosis Date  . Aortic stenosis    s/p AVR // Echocardiogram 4/22: EF 60-65, no  RWMA, mild LVH, Gr 2 DD, normal RVSF, RVSP 22.4, mod MAC, AVR with mean 17  mmHg  . Arthritis    Lupus - hands/knees  . 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 GERD (gastroesophageal reflux disease)    on prilosec r/t gastric sleeve surgery  . HLD (hyperlipidemia)   . Iron deficiency anemia    IV dextran Coladonato  . Nephritis and nephropathy, not specified as acute or chronic, with unspecified pathological lesion in kidney   . Nonspecific abnormal results of liver function study   . Obesity, unspecified   . Other specified acquired hypothyroidism   . Panic disorder without agoraphobia   . Personal history of venous thrombosis and embolism 2002   during pregnancy; ?factor 5 leiden (sees heme)  . Pleural effusion 2005   c/w lupus initial w/u; recurrent Right as pred tapered off July 2011  . Sleep apnea   . 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 Unspecified essential hypertension     SURGICAL HISTORY: Past Surgical History:  Procedure Laterality Date  . AORTIC VALVE REPLACEMENT N/A 04/06/2020   Procedure: AORTIC VALVE REPLACEMENT (AVR) USING INSPIRIS 21 MM AORTIC VALVE;  Surgeon: [name], MD;  Location: [facility];  Service: Open Heart Surgery;  Laterality: N/A;  . BIOPSY  04/12/2019   Procedure: BIOPSY;  Surgeon: Thornton Park, MD;  Location: [facility];  Service: Gastroenterology;;  . ESOPHAGEAL MANOMETRY N/A 07/15/2019   Procedure: ESOPHAGEAL MANOMETRY (EM);  Surgeon: Mauri Pole, MD;  [facility]: [facility];  Service: Endoscopy;  Laterality: N/A;  . ESOPHAGOGASTRODUODENOSCOPY (EGD) WITH PROPOFOL N/A 04/12/2019   Procedure: ESOPHAGOGASTRODUODENOSCOPY (EGD) WITH PROPOFOL;  Surgeon: Thornton Park, MD;  [facility];  Service: Gastroenterology;  Laterality: N/A;  . gastric sleeve  8/12   bariatric surgery Dr Evorn Gong   . LAPAROSCOPIC TUBAL LIGATION  12/09/2010   Procedure: LAPAROSCOPIC  TUBAL LIGATION;  Surgeon: Darlyn Chamber;  Location: [facility];  Service: Gynecology;  Laterality: Bilateral;  Attempted see Nursing note  . pleurocentesis  10/2004  . Pleuryx catheter placement  9/11    VanTright----removed 9/11  . RENAL BIOPSY  09/24/2012  . RIGHT/LEFT HEART CATH AND CORONARY ANGIOGRAPHY N/A 01/19/2020   Procedure: RIGHT/LEFT HEART CATH AND CORONARY ANGIOGRAPHY;  Surgeon: Lorretta Harp, MD;  Location: [facility];  Service: Cardiovascular;  Laterality: N/A;  . svd     x 3  . TEE WITHOUT CARDIOVERSION N/A 04/06/2020   Procedure: TRANSESOPHAGEAL ECHOCARDIOGRAM (TEE);  Surgeon: [name], MD;  Location: [facility];  Service: Open Heart Surgery;  Laterality: N/A;  . THORACENTESIS  2010   with penumonia  . WISDOM TOOTH EXTRACTION      SOCIAL HISTORY: Social History   Socioeconomic History  . Marital status: Divorced    Spouse name: Not on file  . Number of children: 3  . Years of education: Not on file  . Highest education level: Not on file  Occupational History  . Occupation: Clinical research associate at [employer]  . Smoking status: Former Smoker    Packs/day: 0.50    Years: 10.00    Pack years: 5.00    Types: Cigarettes    Quit date: 05/04/2009    Years since quitting: 11.1  . Smokeless tobacco: Never Used  . Tobacco  comment: Socially x10 years (1 pack/month)  Vaping Use  . Vaping Use: Never used  Substance and Sexual Activity  . Alcohol use: Not Currently    Alcohol/week: 0.0 standard drinks    Comment: rare  . Drug use: No  . Sexual activity: Not on file  Other Topics Concern  . Not on file  Social History Narrative  . Not on file   Social Determinants of Health   Financial Resource Strain: Not on file  Food Insecurity: Not on file  Transportation Needs: Not on file  Physical Activity: Not on file  Stress: Not on file  Social Connections: Not on file  Intimate Partner Violence: Not on file    FAMILY HISTORY: Family History  Problem  Relation Age of Onset  . Lupus Sister   . Hypertension Father   . Hyperlipidemia Father   . Leukemia Sister   . Diabetes Other        GM  . Colon cancer Neg Hx   . Esophageal cancer Neg Hx   . Pancreatic cancer Neg Hx     ALLERGIES:  is allergic to lisinopril, lovenox [enoxaparin sodium], moxifloxacin, and lovenox [enoxaparin].  MEDICATIONS:  Current Outpatient Medications  Medication Sig Dispense Refill  . amiodarone (PACERONE) 200 MG tablet Take 200 mg by mouth daily.    Marland Kitchen amoxicillin (AMOXIL) 500 MG tablet Take 4 tabs (2000 mg) 30-60 min before dental procedures 4 tablet 1  . aspirin EC 81 MG EC tablet Take 1 tablet (81 mg total) by mouth daily. Swallow whole. 30 tablet 11  . bismuth subsalicylate (PEPTO BISMOL) 262 MG/15ML suspension Take 30 mLs by mouth every 6 (six) hours as needed for indigestion or diarrhea or loose stools.    . calcium carbonate (TUMS - DOSED IN MG ELEMENTAL CALCIUM) 500 MG chewable tablet Chew 1-2 tablets by mouth 3 (three) times daily as needed for indigestion or heartburn.    . cyanocobalamin (,VITAMIN B-12,) 1000 MCG/ML injection Inject 1,000 mcg into the muscle every 30 (thirty) days.     . hydroxychloroquine (PLAQUENIL) 200 MG tablet Take 200 mg by mouth 2 (two) times daily.    . metoprolol tartrate (LOPRESSOR) 50 MG tablet Take 1 tablet (50 mg total) by mouth 2 (two) times daily. 180 tablet 3  . Multiple Vitamins-Minerals (BARIATRIC MULTIVITAMINS/IRON) CAPS Take 1 tablet by mouth daily.    Marland Kitchen omeprazole (PRILOSEC) 20 MG capsule TAKE 1 CAPSULE (20 MG TOTAL) BY MOUTH 2 (TWO) TIMES DAILY BEFORE A MEAL. 180 capsule 1  . PARoxetine (PAXIL) 40 MG tablet TAKE 1 TABLET BY MOUTH EVERY DAY IN THE MORNING 90 tablet 3  . rosuvastatin (CRESTOR) 20 MG tablet TAKE 1 TABLET BY MOUTH EVERY DAY 90 tablet 3  . warfarin (COUMADIN) 7.5 MG tablet Daily or as directed 145 tablet 0  . levothyroxine (SYNTHROID) 175 MCG tablet Take 1 tablet (175 mcg total) by mouth daily before  breakfast. (Patient taking differently: Take 200 mcg by mouth daily before breakfast.) 30 tablet 3   No current facility-administered medications for this visit.    PHYSICAL EXAMINATION:  ECOG PERFORMANCE STATUS: 0 - Asymptomatic  Vitals:   06/28/20 1305  BP: 111/75  Pulse: 82  Resp: 19  Temp: 97.8 F (36.6 C)  SpO2: 98%   Filed Weights   06/28/20 1305  Weight: (!) 312 lb 3.2 oz (141.6 kg)   GENERAL:alert, no distress and comfortable SKIN: skin color, texture, turgor are normal, no rashes or significant lesions EYES: normal, conjunctiva  are pink and non-injected, sclera clear OROPHARYNX:no exudate, no erythema and lips, buccal mucosa, and tongue normal  NECK: supple, thyroid normal size, non-tender, without nodularity LYMPH:  no palpable lymphadenopathy in the cervical, axillary  LUNGS: clear to auscultation and percussion with normal breathing effort HEART: ES murmur,  ABDOMEN:abdomen soft, non-tender and normal bowel sounds Musculoskeletal:no cyanosis of digits and no clubbing  PSYCH: alert & oriented x 3 with fluent speech NEURO: no focal motor/sensory deficits  LABORATORY DATA:  I have reviewed the data as listed Lab Results  Component Value Date   WBC 4.2 06/18/2020   HGB 9.9 (L) 06/18/2020   HCT 31.5 (L) 06/18/2020   MCV 71.5 (L) 06/18/2020   PLT 259.0 06/18/2020     Chemistry      Component Value Date/Time   NA 138 06/18/2020 1434   NA 140 12/06/2012 1444   K 5.0 06/18/2020 1434   K 5.1 12/06/2012 1444   CL 102 06/18/2020 1434   CO2 29 06/18/2020 1434   CO2 23 12/06/2012 1444   BUN 16 06/18/2020 1434   BUN 21.4 12/06/2012 1444   CREATININE 1.05 06/18/2020 1434   CREATININE 1.1 12/06/2012 1444      Component Value Date/Time   CALCIUM 9.0 06/18/2020 1434   CALCIUM 8.7 12/06/2012 1444   ALKPHOS 77 04/05/2020 0856   ALKPHOS 57 12/06/2012 1444   AST 52 (H) 04/05/2020 0856   AST 24 12/06/2012 1444   ALT 23 04/05/2020 0856   ALT 13 12/06/2012 1444    BILITOT 0.5 04/05/2020 0856   BILITOT <0.20 12/06/2012 1444     RADIOGRAPHIC STUDIES: I have personally reviewed the radiological images as listed and agreed with the findings in the report. No results found.  All questions were answered. The patient knows to call the clinic with any problems, questions or concerns. I spent 45 minutes in the care of this patient including H and P, review of records, counseling and coordination of care. We have reviewed our recommendations for homozygous factor V Leiden mutation, choice of anticoagulation, reviewed causes of iron deficiency anemia, role of intravenous iron supplementation if she continues to have persistent iron deficiency anemia despite oral supplementation, adverse effects from intravenous iron and age-appropriate cancer screening.    Benay Pike, MD 06/28/2020 4:39 PM

## 2020-06-28 NOTE — Telephone Encounter (Signed)
Scheduled follow-up appointment per 5/26 los. Patient is aware. 

## 2020-06-28 NOTE — Assessment & Plan Note (Addendum)
This is a very pleasant patient with IDA,  We have discussed common causes of iron deficiency include blood loss, reduced iron absorption because of previous surgeries, dietary restrictions or other malabsorption issues, medications that reduce gastric acidity are due to inherited disorders such as IRIDA due to TMPRSS6 mutation.  We generally treat iron deficiency anemia with oral supplements if this can be tolerated.  IV iron is appropriate for patients are unable to tolerate due to gastrointestinal side effects or for patients with severe/ongoing blood loss, history of gastric bypass which reduces gastric acid and henceforth will impair intestinal absorption of oral iron, malabsorption syndromes are occasional in pregnancy. There are several formularies of intravenous iron available in the market.  We have discussed about risk of allergic/infusion reactions including potentially life-threatening anaphylaxis with intravenous iron however these serious allergic reactions are exceedingly rare and overestimated.  In contrast to serious allergic reactions, IV iron may be associated with nonallergic infusion reactions including self-limited urticaria, palpitations, dizziness, neck and back spasm which again occur in less than 1% of the individuals and do not progress to more serious reactions. We will proceed with Venofer given moderate anemia and Hb less than 10.

## 2020-06-28 NOTE — Assessment & Plan Note (Signed)
55 yr old female patient with homozygous factor V leiden mutation referred to hematology for anticoagulation recommendations. Given spontaneous brain bleed, at least two episodes of VTE, it is best she continue with coumadin as anticoagulation. Again, as with any blood thinner, there is increased risk of bleeding with coumadin but reversal agents may be more readily available. She is agreeable to the recommendations.

## 2020-06-29 ENCOUNTER — Telehealth: Payer: Self-pay | Admitting: Hematology and Oncology

## 2020-06-29 NOTE — Telephone Encounter (Signed)
Scheduled appts per 5/26 sch msg. Pt aware.  

## 2020-07-03 ENCOUNTER — Ambulatory Visit (INDEPENDENT_AMBULATORY_CARE_PROVIDER_SITE_OTHER): Payer: 59 | Admitting: General Practice

## 2020-07-03 ENCOUNTER — Other Ambulatory Visit: Payer: Self-pay

## 2020-07-03 DIAGNOSIS — E538 Deficiency of other specified B group vitamins: Secondary | ICD-10-CM | POA: Diagnosis not present

## 2020-07-03 DIAGNOSIS — Z7901 Long term (current) use of anticoagulants: Secondary | ICD-10-CM

## 2020-07-03 DIAGNOSIS — Z86711 Personal history of pulmonary embolism: Secondary | ICD-10-CM | POA: Diagnosis not present

## 2020-07-03 DIAGNOSIS — D6851 Activated protein C resistance: Secondary | ICD-10-CM | POA: Diagnosis not present

## 2020-07-03 LAB — POCT INR: INR: 1.5 — AB (ref 2.0–3.0)

## 2020-07-03 MED ORDER — CYANOCOBALAMIN 1000 MCG/ML IJ SOLN
1000.0000 ug | Freq: Once | INTRAMUSCULAR | Status: AC
  Administered 2020-07-03: 1000 ug via INTRAMUSCULAR

## 2020-07-03 NOTE — Patient Instructions (Addendum)
Pre visit review using our clinic review tool, if applicable. No additional management support is needed unless otherwise documented below in the visit note.  Take 3 tablets (15 mg) today, tomorrow and Thursday.  On Friday continue to take 1 1/2 tablets daily except take 1 tablet on Mon Wed and Friday.  Re-check in 2 weeks.

## 2020-07-03 NOTE — Progress Notes (Signed)
Medical screening examination/treatment/procedure(s) were performed by non-physician practitioner and as supervising physician I was immediately available for consultation/collaboration. I agree with above. Liv Rallis, MD   

## 2020-07-06 ENCOUNTER — Other Ambulatory Visit: Payer: Self-pay

## 2020-07-06 ENCOUNTER — Encounter: Payer: Self-pay | Admitting: Internal Medicine

## 2020-07-06 ENCOUNTER — Ambulatory Visit (INDEPENDENT_AMBULATORY_CARE_PROVIDER_SITE_OTHER): Payer: 59 | Admitting: Internal Medicine

## 2020-07-06 VITALS — BP 118/70 | HR 70 | Ht 65.0 in | Wt 310.0 lb

## 2020-07-06 DIAGNOSIS — E7800 Pure hypercholesterolemia, unspecified: Secondary | ICD-10-CM

## 2020-07-06 NOTE — Progress Notes (Signed)
Cardiology Office Note   Date:  07/06/2020   ID:  [name], DOB Apr 08, 1965, MRN [id number]  PCP:  Abner Greenspan, MD  Cardiologist:   [name], MD    Pt presents for f/u of AV dz     History of Present Illness: [name] is a 55 y.o. female with a history of HTN, HL, OSA, lupus,, Factor V Leiden, DVT/PE, L Lung empyema, intracerbral hemorrhage   She presented to [facility] in Dec 2021 for CP   Pain occurred at rest   ALso with increased SOB   Pt underwent L heart cath which showed normal coronary artieres.   Mean gradient across AV was 52 mm.  Echo showed normal LVEF   Mean gradient 46 mm Hg    CHest CT showed multilobar bronchopneumonia bilaterally    SHe was discharged home [facility]  for further follow up    In March 2022 she was readmitted and underent AVR with 21 mm Inspiris bioprosthesis.  Post op had atrial fibrillation   Placed on amiodarne.   The pt was seen in f/u by [facility] n March.  Doing well   Echo in APril 2022 showed prosthesis functioning well   Mean gradient 17 mm Hg.  Since seen the pt denies CP   Breathing is OK    No palpitations     About to start cardiac rehab   Current Meds  Medication Sig  . amoxicillin (AMOXIL) 500 MG tablet Take 4 tabs (2000 mg) 30-60 min before dental procedures  . aspirin EC 81 MG EC tablet Take 1 tablet (81 mg total) by mouth daily. Swallow whole.  . bismuth subsalicylate (PEPTO BISMOL) 262 MG/15ML suspension Take 30 mLs by mouth every 6 (six) hours as needed for indigestion or diarrhea or loose stools.  . calcium carbonate (TUMS - DOSED IN MG ELEMENTAL CALCIUM) 500 MG chewable tablet Chew 1-2 tablets by mouth 3 (three) times daily as needed for indigestion or heartburn.  . cyanocobalamin (,VITAMIN B-12,) 1000 MCG/ML injection Inject 1,000 mcg into the muscle every 30 (thirty) days.   . hydroxychloroquine (PLAQUENIL) 200 MG tablet Take 200 mg by mouth 2 (two) times daily.  Marland Kitchen levothyroxine (SYNTHROID) 200 MCG tablet Take 200  mcg by mouth daily before breakfast.  . metoprolol tartrate (LOPRESSOR) 50 MG tablet Take 1 tablet (50 mg total) by mouth 2 (two) times daily.  . Multiple Vitamins-Minerals (BARIATRIC MULTIVITAMINS/IRON) CAPS Take 1 tablet by mouth daily.  Marland Kitchen omeprazole (PRILOSEC) 20 MG capsule TAKE 1 CAPSULE (20 MG TOTAL) BY MOUTH 2 (TWO) TIMES DAILY BEFORE A MEAL.  Marland Kitchen PARoxetine (PAXIL) 40 MG tablet TAKE 1 TABLET BY MOUTH EVERY DAY IN THE MORNING  . rosuvastatin (CRESTOR) 20 MG tablet TAKE 1 TABLET BY MOUTH EVERY DAY  . warfarin (COUMADIN) 7.5 MG tablet Daily or as directed  . [DISCONTINUED] amiodarone (PACERONE) 200 MG tablet Take 200 mg by mouth daily.     Allergies:   Lisinopril, Lovenox [enoxaparin sodium], Moxifloxacin, and Lovenox [enoxaparin]   Past Medical History:  Diagnosis Date  . Aortic stenosis    s/p AVR // Echocardiogram 4/22: EF 60-65, no RWMA, mild LVH, Gr 2 DD, normal RVSF, RVSP 22.4, mod MAC, AVR with mean 17 mmHg  . Arthritis    Lupus - hands/knees  . 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  GERD (gastroesophageal reflux disease)    on prilosec r/t gastric sleeve surgery  . HLD (hyperlipidemia)   . Iron deficiency anemia    IV dextran Coladonato  . Nephritis and nephropathy, not specified as acute or chronic, with unspecified pathological lesion in kidney   . Nonspecific abnormal results of liver function study   . Obesity, unspecified   . Other specified acquired hypothyroidism   . Panic disorder without agoraphobia   . Personal history of venous thrombosis and embolism 2002   during pregnancy; ?factor 5 leiden (sees heme)  . Pleural effusion 2005   c/w lupus initial w/u; recurrent Right as pred tapered off July 2011  . Sleep apnea   . 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 Unspecified essential hypertension     Past Surgical  History:  Procedure Laterality Date  . AORTIC VALVE REPLACEMENT N/A 04/06/2020   Procedure: AORTIC VALVE REPLACEMENT (AVR) USING INSPIRIS 21 MM AORTIC VALVE;  Surgeon: [name], MD;  Location: [facility];  Service: Open Heart Surgery;  Laterality: N/A;  . BIOPSY  04/12/2019   Procedure: BIOPSY;  Surgeon: Thornton Park, MD;  [facility];  Service: Gastroenterology;;  . ESOPHAGEAL MANOMETRY N/A 07/15/2019   Procedure: ESOPHAGEAL MANOMETRY (EM);  Surgeon: Mauri Pole, MD;  [facility];  Service: Endoscopy;  Laterality: N/A;  . ESOPHAGOGASTRODUODENOSCOPY (EGD) WITH PROPOFOL N/A 04/12/2019   Procedure: ESOPHAGOGASTRODUODENOSCOPY (EGD) WITH PROPOFOL;  Surgeon: Thornton Park, MD;  [facility];  Service: Gastroenterology;  Laterality: N/A;  . gastric sleeve  8/12   bariatric surgery Dr Evorn Gong   . LAPAROSCOPIC TUBAL LIGATION  12/09/2010   Procedure: LAPAROSCOPIC TUBAL LIGATION;  Surgeon: Darlyn Chamber;  Location: [facility];  Service: Gynecology;  Laterality: Bilateral;  Attempted see Nursing note  . pleurocentesis  10/2004  . Pleuryx catheter placement  9/11    VanTright----removed 9/11  . RENAL BIOPSY  09/24/2012  . RIGHT/LEFT HEART CATH AND CORONARY ANGIOGRAPHY N/A 01/19/2020   Procedure: RIGHT/LEFT HEART CATH AND CORONARY ANGIOGRAPHY;  Surgeon: Lorretta Harp, MD;  Location: [facility];  Service: Cardiovascular;  Laterality: N/A;  . svd     x 3  . TEE WITHOUT CARDIOVERSION N/A 04/06/2020   Procedure: TRANSESOPHAGEAL ECHOCARDIOGRAM (TEE);  Surgeon: [name], MD;  Location: [facility];  Service: Open Heart Surgery;  Laterality: N/A;  . THORACENTESIS  2010   with penumonia  . WISDOM TOOTH EXTRACTION       Social History:  The patient  reports that she quit smoking about 11 years ago. Her smoking use included cigarettes. She has a 5.00 pack-year smoking history. She has never used smokeless tobacco. She reports previous alcohol use. She reports  that she does not use drugs.   Family History:  [name] family history includes Diabetes in an other family member; Hyperlipidemia in her father; Hypertension in her father; Leukemia in her sister; Lupus in her sister.    ROS:  Please see the history of present illness. All other systems are reviewed and  Negative to the above problem except as noted.    PHYSICAL EXAM: VS:  BP 118/70   Pulse 70   Ht _0  (1.651 m)   Wt (!) 310 lb (140.6 kg)   LMP 11/08/2010   SpO2 96%   BMI 51.59 kg/m   GEN: Morbidly obese 55 yo in no acute distress  HEENT: normal  Neck:  no JVD, carotid bruits Cardiac: RRR; Gr II/VI systolic murmur base   No LE  edema  Respiratory:  clear to auscultation bilaterally, normal work of breathing GI: soft, nontender, nondistended, + BS  No hepatomegaly  MS: no deformity Moving all extremities   Skin: warm and dry, no rash Neuro:  Strength and sensation are intact Psych: euthymic mood, full affect   EKG:  EKG is not ordered today.   Lipid Panel    Component Value Date/Time   CHOL 206 (H) 01/04/2019 1615   TRIG 124.0 01/04/2019 1615   HDL 49.50 01/04/2019 1615   CHOLHDL 4 01/04/2019 1615   VLDL 24.8 01/04/2019 1615   LDLCALC 131 (H) 01/04/2019 1615   LDLDIRECT 203.7 06/03/2011 1433      Wt Readings from Last 3 Encounters:  07/06/20 (!) 310 lb (140.6 kg)  06/28/20 (!) 312 lb 3.2 oz (141.6 kg)  06/18/20 (!) 309 lb 8 oz (140.4 kg)      ASSESSMENT AND PLAN:  1  AV dz   Pt is s/p AVR in March 2022 with 21 mm bioprosthesis.   Mean gradient 28m on echo   WIll continue to follow    SBE prophylaxis pror to dental work  2  Post op atrial fibrllation   No symptomatic recurrence   Stop amiodarone  3  Hx Facto V Leiden   COntinue on anticoagulation    INR followed in clinic   Note has been difficult probably due to amio and post op  4  HTN  BP is OK   FOllw  5  Hx SLE    Continue Plaquenil  6   HL  Keep on Crestor  7  Anemia   Seen by heme   WIll  have f/u for infusion of FE soon  Plan for f/u in the fall   Current medicines are reviewed at length with the patient today.  The patient does not have concerns regarding medicines.  Signed, PDorris Carnes MD  07/06/2020 8:23 PM    [email or web address]  [id number]: [phone] Fax: [phone]

## 2020-07-06 NOTE — Patient Instructions (Signed)
Medication Instructions:  Your physician has recommended you make the following change in your medication:  1.) stop amiodarone  *If you need a refill on your cardiac medications before your next appointment, please call your pharmacy*   Lab Work: Today: LIPIDS  If you have labs (blood work) drawn today and your tests are completely normal, you will receive your results only by: Marland Kitchen MyChart Message (if you have MyChart) OR . A paper copy in the mail If you have any lab test that is abnormal or we need to change your treatment, we will call you to review the results.   Testing/Procedures: none   Follow-Up: At [facility], you and your health needs are our priority.  As part of our continuing mission to provide you with exceptional heart care, we have created designated Provider Care Teams.  These Care Teams include your primary Cardiologist (physician) and Advanced Practice Providers (APPs -  Physician Assistants and Nurse Practitioners) who all work together to provide you with the care you need, when you need it.   Your next appointment:   4 month(s)  The format for your next appointment:   In Person  Provider:   You may see Dietrich Pates, MD or one of the following Advanced Practice Providers on your designated Care Team:    Tereso Newcomer, PA-C  Chelsea Aus, [place]  Other Instructions

## 2020-07-07 LAB — LIPID PANEL
Chol/HDL Ratio: 3.2 ratio (ref 0.0–4.4)
Cholesterol, Total: 185 mg/dL (ref 100–199)
HDL: 57 mg/dL (ref >39)
LDL Chol Calc (NIH): 109 mg/dL — ABNORMAL HIGH (ref 0–99)
Triglycerides: 105 mg/dL (ref 0–149)
VLDL Cholesterol Cal: 19 mg/dL (ref 5–40)

## 2020-07-11 ENCOUNTER — Telehealth: Payer: Self-pay | Admitting: *Deleted

## 2020-07-11 DIAGNOSIS — E7800 Pure hypercholesterolemia, unspecified: Secondary | ICD-10-CM

## 2020-07-11 MED ORDER — EZETIMIBE 10 MG PO TABS: 10.0000 mg | ORAL_TABLET | Freq: Every day | ORAL | 3 refills | Status: DC

## 2020-07-11 NOTE — Telephone Encounter (Signed)
Sent information to pt via MyChart as we discussed on the phone yesterday.

## 2020-07-11 NOTE — Telephone Encounter (Signed)
-----   Message from Dietrich Pates V, MD sent at 07/10/2020  8:46 PM EDT ----- Add Zetia to regimen 10mg    Decreases fat absorption from gut    Lowers cholesterol Keep on Crestor  F/U lipids in 8 wks

## 2020-07-13 NOTE — Telephone Encounter (Signed)
No response from pt.  Closed referral  

## 2020-07-17 ENCOUNTER — Ambulatory Visit: Payer: 59

## 2020-07-17 ENCOUNTER — Inpatient Hospital Stay: Payer: 59 | Attending: Hematology and Oncology

## 2020-07-17 ENCOUNTER — Other Ambulatory Visit: Payer: Self-pay

## 2020-07-17 DIAGNOSIS — D509 Iron deficiency anemia, unspecified: Secondary | ICD-10-CM | POA: Insufficient documentation

## 2020-07-17 DIAGNOSIS — Z79899 Other long term (current) drug therapy: Secondary | ICD-10-CM | POA: Insufficient documentation

## 2020-07-17 MED ORDER — SODIUM CHLORIDE 0.9 % IV SOLN
200.0000 mg | Freq: Once | INTRAVENOUS | Status: AC
  Administered 2020-07-17: 200 mg via INTRAVENOUS
  Filled 2020-07-17: qty 200

## 2020-07-17 MED ORDER — SODIUM CHLORIDE 0.9 % IV SOLN
Freq: Once | INTRAVENOUS | Status: DC
  Filled 2020-07-17: qty 250

## 2020-07-17 NOTE — Patient Instructions (Signed)

## 2020-07-17 NOTE — Progress Notes (Signed)
Pt waited full 30 minute wait time post iron observation with no issues VSS

## 2020-07-19 ENCOUNTER — Other Ambulatory Visit: Payer: Self-pay

## 2020-07-19 ENCOUNTER — Inpatient Hospital Stay: Payer: 59

## 2020-07-19 VITALS — BP 122/71 | HR 75 | Temp 98.6°F | Resp 18

## 2020-07-19 DIAGNOSIS — D509 Iron deficiency anemia, unspecified: Secondary | ICD-10-CM | POA: Diagnosis not present

## 2020-07-19 MED ORDER — SODIUM CHLORIDE 0.9 % IV SOLN
200.0000 mg | Freq: Once | INTRAVENOUS | Status: AC
  Administered 2020-07-19: 200 mg via INTRAVENOUS
  Filled 2020-07-19: qty 200

## 2020-07-19 MED ORDER — SODIUM CHLORIDE 0.9 % IV SOLN
Freq: Once | INTRAVENOUS | Status: AC
  Filled 2020-07-19: qty 250

## 2020-07-19 NOTE — Progress Notes (Signed)
Declined to stay for 30 min observation post iron infusion. VSS and pt discharged in stable condition, ambulatory to lobby. Declined AVS

## 2020-07-19 NOTE — Patient Instructions (Signed)

## 2020-07-20 MED ORDER — DENOSUMAB 60 MG/ML ~~LOC~~ SOSY
PREFILLED_SYRINGE | SUBCUTANEOUS | Status: AC
  Filled 2020-07-20: qty 1

## 2020-07-23 ENCOUNTER — Inpatient Hospital Stay: Payer: 59

## 2020-07-23 ENCOUNTER — Other Ambulatory Visit: Payer: Self-pay

## 2020-07-23 VITALS — BP 145/76 | HR 68 | Temp 97.7°F | Resp 18

## 2020-07-23 DIAGNOSIS — D509 Iron deficiency anemia, unspecified: Secondary | ICD-10-CM

## 2020-07-23 MED ORDER — SODIUM CHLORIDE 0.9 % IV SOLN
200.0000 mg | Freq: Once | INTRAVENOUS | Status: AC
Start: 2020-07-23 — End: 2020-07-23
  Administered 2020-07-23: 200 mg via INTRAVENOUS
  Filled 2020-07-23: qty 200

## 2020-07-23 MED ORDER — SODIUM CHLORIDE 0.9 % IV SOLN
Freq: Once | INTRAVENOUS | Status: AC
Start: 2020-07-23 — End: 2020-07-23
  Filled 2020-07-23: qty 250

## 2020-07-23 NOTE — Patient Instructions (Signed)
Iron Dextran injection What is this medication? IRON DEXTRAN (AHY ern DEX tran) is an iron complex. Iron is used to make healthy red blood cells, which carry oxygen and nutrients through the body. This medicine is used to treat people who cannot take iron by mouth and havelow levels of iron in the blood. This medicine may be used for other purposes; ask your health care provider orpharmacist if you have questions. COMMON BRAND NAME(S): Dexferrum, INFeD What should I tell my care team before I take this medication? They need to know if you have any of these conditions: anemia not caused by low iron levels heart disease high levels of iron in the blood kidney disease liver disease an unusual or allergic reaction to iron, other medicines, foods, dyes, or preservatives pregnant or trying to get pregnant breast-feeding How should I use this medication? This medicine is for injection into a vein or a muscle. It is given by a healthcare professional in a hospital or clinic setting. Talk to your pediatrician regarding the use of this medicine in children. While this drug may be prescribed for children as young as 4 months old for selectedconditions, precautions do apply. Overdosage: If you think you have taken too much of this medicine contact apoison control center or emergency room at once. NOTE: This medicine is only for you. Do not share this medicine with others. What if I miss a dose? It is important not to miss your dose. Call your doctor or health careprofessional if you are unable to keep an appointment. What may interact with this medication? Do not take this medicine with any of the following medications: deferoxamine dimercaprol other iron products This medicine may also interact with the following medications: chloramphenicol deferasirox This list may not describe all possible interactions. Give your health care provider a list of all the medicines, herbs, non-prescription drugs,  or dietary supplements you use. Also tell them if you smoke, drink alcohol, or use illegaldrugs. Some items may interact with your medicine. What should I watch for while using this medication? Visit your doctor or health care professional regularly. Tell your doctor if your symptoms do not start to get better or if they get worse. You may needblood work done while you are taking this medicine. You may need to follow a special diet. Talk to your doctor. Foods that contain iron include: whole grains/cereals, dried fruits, beans, or peas, leafy greenvegetables, and organ meats (liver, kidney). Long-term use of this medicine may increase your risk of some cancers. Talk toyour doctor about how to limit your risk. What side effects may I notice from receiving this medication? Side effects that you should report to your doctor or health care professionalas soon as possible: allergic reactions like skin rash, itching or hives, swelling of the face, lips, or tongue blue lips, nails, or skin breathing problems changes in blood pressure chest pain confusion fast, irregular heartbeat feeling faint or lightheaded, falls fever or chills flushing, sweating, or hot feelings joint or muscle aches or pains pain, tingling, numbness in the hands or feet seizures unusually weak or tired Side effects that usually do not require medical attention (report to yourdoctor or health care professional if they continue or are bothersome): change in taste (metallic taste) diarrhea headache irritation at site where injected nausea, vomiting stomach upset This list may not describe all possible side effects. Call your doctor for medical advice about side effects. You may report side effects to FDA at1-800-FDA-1088. Where should I keep my   medication? This drug is given in a hospital or clinic and will not be stored at home. NOTE: This sheet is a summary. It may not cover all possible information. If you have questions  about this medicine, talk to your doctor, pharmacist, orhealth care provider.  2022 Elsevier/Gold Standard (2007-06-08 16:59:50)  

## 2020-07-25 ENCOUNTER — Other Ambulatory Visit: Payer: Self-pay

## 2020-07-25 ENCOUNTER — Telehealth: Payer: Self-pay | Admitting: Family Medicine

## 2020-07-25 ENCOUNTER — Inpatient Hospital Stay: Payer: 59

## 2020-07-25 VITALS — BP 123/52 | HR 80 | Temp 98.8°F | Resp 18

## 2020-07-25 DIAGNOSIS — D509 Iron deficiency anemia, unspecified: Secondary | ICD-10-CM | POA: Diagnosis not present

## 2020-07-25 DIAGNOSIS — E890 Postprocedural hypothyroidism: Secondary | ICD-10-CM

## 2020-07-25 MED ORDER — SODIUM CHLORIDE 0.9 % IV SOLN
Freq: Once | INTRAVENOUS | Status: AC
  Filled 2020-07-25: qty 250

## 2020-07-25 MED ORDER — SODIUM CHLORIDE 0.9 % IV SOLN
200.0000 mg | Freq: Once | INTRAVENOUS | Status: AC
Start: 2020-07-25 — End: 2020-07-25
  Administered 2020-07-25: 200 mg via INTRAVENOUS
  Filled 2020-07-25: qty 200

## 2020-07-25 NOTE — Patient Instructions (Signed)

## 2020-07-25 NOTE — Telephone Encounter (Signed)
-----   Message from Alvina Chou sent at 07/09/2020  4:13 PM EDT ----- Regarding: Lab orders for Thursday, 6.23.22 Lab orders, thanks

## 2020-07-26 ENCOUNTER — Other Ambulatory Visit: Payer: 59

## 2020-07-27 ENCOUNTER — Ambulatory Visit: Payer: 59

## 2020-07-27 ENCOUNTER — Telehealth: Payer: Self-pay

## 2020-07-27 NOTE — Telephone Encounter (Signed)
This RN attempted to reach patient and left a voicemail regarding her 3:15pm appointment today. Patient not seen in infusion clinic for scheduled iron infusion.

## 2020-07-30 ENCOUNTER — Telehealth: Payer: Self-pay | Admitting: Hematology and Oncology

## 2020-07-30 NOTE — Telephone Encounter (Signed)
R/s appt per 6/23 sch msg. Pt aware.

## 2020-08-01 ENCOUNTER — Inpatient Hospital Stay: Payer: 59

## 2020-08-01 ENCOUNTER — Other Ambulatory Visit: Payer: Self-pay

## 2020-08-01 VITALS — BP 132/75 | HR 61 | Temp 98.1°F | Resp 15

## 2020-08-01 DIAGNOSIS — D509 Iron deficiency anemia, unspecified: Secondary | ICD-10-CM | POA: Diagnosis not present

## 2020-08-01 MED ORDER — SODIUM CHLORIDE 0.9 % IV SOLN
Freq: Once | INTRAVENOUS | Status: AC
  Filled 2020-08-01: qty 250

## 2020-08-01 MED ORDER — SODIUM CHLORIDE 0.9 % IV SOLN
200.0000 mg | Freq: Once | INTRAVENOUS | Status: AC
  Administered 2020-08-01: 200 mg via INTRAVENOUS
  Filled 2020-08-01: qty 200

## 2020-08-01 NOTE — Patient Instructions (Signed)
[facility]  Discharge Instructions: Thank you for choosing [facility] to provide your oncology and hematology care.   If you have a lab appointment with the Cancer Center, please go directly to the Cancer Center and check in at the registration area.   Wear comfortable clothing and clothing appropriate for easy access to any Portacath or PICC line.   We strive to give you quality time with your provider. You may need to reschedule your appointment if you arrive late (15 or more minutes).  Arriving late affects you and other patients whose appointments are after yours.  Also, if you miss three or more appointments without notifying the office, you may be dismissed from the clinic at the provider's discretion.      For prescription refill requests, have your pharmacy contact our office and allow 72 hours for refills to be completed.    Today you received the following medication - Venofer      To help prevent nausea and vomiting after your treatment, we encourage you to take your nausea medication as directed.  BELOW ARE SYMPTOMS THAT SHOULD BE REPORTED IMMEDIATELY: *FEVER GREATER THAN 100.4 F (38 C) OR HIGHER *CHILLS OR SWEATING *NAUSEA AND VOMITING THAT IS NOT CONTROLLED WITH YOUR NAUSEA MEDICATION *UNUSUAL SHORTNESS OF BREATH *UNUSUAL BRUISING OR BLEEDING *URINARY PROBLEMS (pain or burning when urinating, or frequent urination) *BOWEL PROBLEMS (unusual diarrhea, constipation, pain near the anus) TENDERNESS IN MOUTH AND THROAT WITH OR WITHOUT PRESENCE OF ULCERS (sore throat, sores in mouth, or a toothache) UNUSUAL RASH, SWELLING OR PAIN  UNUSUAL VAGINAL DISCHARGE OR ITCHING   Items with * indicate a potential emergency and should be followed up as soon as possible or go to the Emergency Department if any problems should occur.  Please show the CHEMOTHERAPY ALERT CARD or IMMUNOTHERAPY ALERT CARD at check-in to the Emergency Department and  triage nurse.  Should you have questions after your visit or need to cancel or reschedule your appointment, please contact [facility]  Dept: [phone]  and follow the prompts.  Office hours are 8:00 a.m. to 4:30 p.m. Monday - Friday. Please note that voicemails left after 4:00 p.m. may not be returned until the following business day.  We are closed weekends and major holidays. You have access to a nurse at all times for urgent questions. Please call the main number to the clinic Dept: [phone] and follow the prompts.   For any non-urgent questions, you may also contact your provider using MyChart. We now offer e-Visits for anyone 18 and older to request care online for non-urgent symptoms. For details visit [email or web address].   Also download the MyChart app! Go to the app store, search "MyChart", open the app, select [facility], and log in with your MyChart username and password.  Due to Covid, a mask is required upon entering the hospital/clinic. If you do not have a mask, one will be given to you upon arrival. For doctor visits, patients may have 1 support person aged 18 or older with them. For treatment visits, patients cannot have anyone with them due to current Covid guidelines and our immunocompromised population.   Iron Sucrose injection What is this medication? IRON SUCROSE (AHY ern SOO krohs) is an iron complex. Iron is used to make healthy red blood cells, which carry oxygen and nutrients throughout the body. This medicine is used to treat iron deficiency anemia in people with chronickidney disease. This medicine may   be used for other purposes; ask your health care provider orpharmacist if you have questions. COMMON BRAND NAME(S): Venofer What should I tell my care team before I take this medication? They need to know if you have any of these conditions: anemia not caused by low iron levels heart disease high levels of iron in the  blood kidney disease liver disease an unusual or allergic reaction to iron, other medicines, foods, dyes, or preservatives pregnant or trying to get pregnant breast-feeding How should I use this medication? This medicine is for infusion into a vein. It is given by a health careprofessional in a hospital or clinic setting. Talk to your pediatrician regarding the use of this medicine in children. While this drug may be prescribed for children as young as 2 years for selectedconditions, precautions do apply. Overdosage: If you think you have taken too much of this medicine contact apoison control center or emergency room at once. NOTE: This medicine is only for you. Do not share this medicine with others. What if I miss a dose? It is important not to miss your dose. Call your doctor or health careprofessional if you are unable to keep an appointment. What may interact with this medication? Do not take this medicine with any of the following medications: deferoxamine dimercaprol other iron products This medicine may also interact with the following medications: chloramphenicol deferasirox This list may not describe all possible interactions. Give your health care provider a list of all the medicines, herbs, non-prescription drugs, or dietary supplements you use. Also tell them if you smoke, drink alcohol, or use illegaldrugs. Some items may interact with your medicine. What should I watch for while using this medication? Visit your doctor or healthcare professional regularly. Tell your doctor or healthcare professional if your symptoms do not start to get better or if theyget worse. You may need blood work done while you are taking this medicine. You may need to follow a special diet. Talk to your doctor. Foods that contain iron include: whole grains/cereals, dried fruits, beans, or peas, leafy greenvegetables, and organ meats (liver, kidney). What side effects may I notice from receiving this  medication? Side effects that you should report to your doctor or health care professionalas soon as possible: allergic reactions like skin rash, itching or hives, swelling of the face, lips, or tongue breathing problems changes in blood pressure cough fast, irregular heartbeat feeling faint or lightheaded, falls fever or chills flushing, sweating, or hot feelings joint or muscle aches/pains seizures swelling of the ankles or feet unusually weak or tired Side effects that usually do not require medical attention (report to yourdoctor or health care professional if they continue or are bothersome): diarrhea feeling achy headache irritation at site where injected nausea, vomiting stomach upset tiredness This list may not describe all possible side effects. Call your doctor for medical advice about side effects. You may report side effects to FDA at1-800-FDA-1088. Where should I keep my medication? This drug is given in a hospital or clinic and will not be stored at home. NOTE: This sheet is a summary. It may not cover all possible information. If you have questions about this medicine, talk to your doctor, pharmacist, orhealth care provider.  2022 Elsevier/Gold Standard (2010-10-31 17:14:35)   

## 2020-08-03 ENCOUNTER — Other Ambulatory Visit: Payer: Self-pay

## 2020-08-03 ENCOUNTER — Other Ambulatory Visit (INDEPENDENT_AMBULATORY_CARE_PROVIDER_SITE_OTHER): Payer: 59

## 2020-08-03 DIAGNOSIS — E890 Postprocedural hypothyroidism: Secondary | ICD-10-CM | POA: Diagnosis not present

## 2020-08-03 LAB — TSH: TSH: 0.66 u[IU]/mL (ref 0.35–5.50)

## 2020-08-09 ENCOUNTER — Encounter: Payer: Self-pay | Admitting: *Deleted

## 2020-08-13 ENCOUNTER — Encounter: Payer: Self-pay | Admitting: Emergency Medicine

## 2020-08-13 ENCOUNTER — Other Ambulatory Visit: Payer: Self-pay

## 2020-08-13 ENCOUNTER — Ambulatory Visit (INDEPENDENT_AMBULATORY_CARE_PROVIDER_SITE_OTHER): Payer: 59

## 2020-08-13 ENCOUNTER — Ambulatory Visit
Admission: EM | Admit: 2020-08-13 | Discharge: 2020-08-13 | Disposition: A | Discharge status: Home / Self Care | Payer: 59 | Attending: Family Medicine | Admitting: Family Medicine

## 2020-08-13 ENCOUNTER — Telehealth: Payer: Self-pay | Admitting: *Deleted

## 2020-08-13 DIAGNOSIS — U071 COVID-19: Secondary | ICD-10-CM | POA: Diagnosis not present

## 2020-08-13 DIAGNOSIS — R0602 Shortness of breath: Secondary | ICD-10-CM

## 2020-08-13 DIAGNOSIS — R059 Cough, unspecified: Secondary | ICD-10-CM | POA: Diagnosis not present

## 2020-08-13 DIAGNOSIS — Z8709 Personal history of other diseases of the respiratory system: Secondary | ICD-10-CM | POA: Diagnosis not present

## 2020-08-13 DIAGNOSIS — R062 Wheezing: Secondary | ICD-10-CM

## 2020-08-13 MED ORDER — PROMETHAZINE-DM 6.25-15 MG/5ML PO SYRP: 5.0000 mL | ORAL_SOLUTION | Freq: Four times a day (QID) | ORAL | 0 refills | Status: DC | PRN

## 2020-08-13 MED ORDER — ALBUTEROL SULFATE HFA 108 (90 BASE) MCG/ACT IN AERS: 1.0000 | INHALATION_SPRAY | Freq: Four times a day (QID) | RESPIRATORY_TRACT | 0 refills | Status: DC | PRN

## 2020-08-13 NOTE — ED Triage Notes (Signed)
Friday morning started having cold symptoms, 2 at-home covid tests positive. Began having wheezing this morning, unable to get in at PCP. They told her to come in for a chest x-ray. 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 No audible wheezing or visible distress during triage.

## 2020-08-13 NOTE — Telephone Encounter (Signed)
Patient called stating that she tested positive for covid 08/10/20.  Patient stated that she started with a runny nose and sore throat. Patient stated that she has been taking Mucinex. Patient stated now her throat is more sore and she is having body aches and chills, but denies a fever. Patient stated that she is wheezing some  when she has a bad coughing spell. Patient was advised since she is wheezing she should have a face to face evaluation so that someone can listen to her lungs. Patient was given information on the [facility] at [facility]. Patient stated that she will get dressed and head to the UC now. Patient was also given ER precautions.

## 2020-08-13 NOTE — ED Provider Notes (Signed)
[facility]    CSN: [id number] Arrival date & time: 08/13/20  1122      History   Chief Complaint Chief Complaint  Patient presents with   Wheezing    HPI [name] is a 55 y.o. female.   Patient presenting today with 3-day history of cough, congestion, sore throat and now this morning started with wheezing, chest tightness, shortness of breath.  Denies known fever but is having chills.  Denies chest pain, abdominal pain, nausea vomiting or diarrhea, weakness, dizziness, severe headache.  So far using over-the-counter cold and congestion medications with minimal relief.  PCP told her to come to get a chest x-ray at urgent care given her history of empyema and a large pleural effusion.  She is also had a past history of PE and factor V Leiden.   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    Loculated-chronic on Left-VanTright; s/p VATS 5/10   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   GERD (gastroesophageal reflux disease)    on prilosec r/t gastric sleeve surgery   HLD (hyperlipidemia)    Iron deficiency anemia    IV dextran [place]   Nephritis and nephropathy, not specified as acute or chronic, with unspecified pathological lesion in kidney    Nonspecific abnormal results of liver function study    Obesity, unspecified    Other specified acquired hypothyroidism    Panic disorder without agoraphobia    Personal history of venous thrombosis and embolism 2002   during pregnancy; ?factor 5 leiden (sees heme)   Pleural effusion 2005   c/w lupus initial w/u; recurrent Right as pred tapered off July 2011   Sleep apnea    Systemic lupus erythematosus ([place])    renal GN, hx of pericardial eff in late 90's   Unspecified essential hypertension      Patient Active Problem List   Diagnosis Date Noted   IDA (iron deficiency anemia) 06/28/2020   Iron deficiency anemia 06/18/2020   History of atrial fibrillation 06/18/2020   S/P aortic valve replacement with bioprosthetic valve 04/06/2020   Coronary artery disease 04/06/2020   Empyema lung ([place]) 01/17/2020   Chest pain of uncertain etiology 46/80/3212   Esophageal diverticulum    Esophageal dysphagia    Fatigue 05/20/2019   Systemic lupus erythematosus with lung involvement ([place]) 01/04/2019   Aortic stenosis 08/15/2017   Vitamin B12 deficiency 11/15/2015   Nausea & vomiting 10/15/2015   Allergic rhinitis 07/01/2015   History of cerebral hemorrhage 07/01/2015   Chronic daily headache 06/29/2015   Screening for osteoporosis 11/04/2013   Encounter for therapeutic drug monitoring 03/25/2013   CKD (chronic kidney disease) stage 2, GFR 60-89 ml/min 12/20/2012   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   Routine general medical examination at a health care facility 12/07/2012   Steroid long-term use 12/07/2012   History of HPV infection 12/07/2012   Long term (current) use of anticoagulants 10/06/2012   Chronic anticoagulation 04/22/2012   S/P bariatric surgery 04/22/2012   Homozygous Factor V Leiden mutation ([place]) 04/22/2012   OSA on CPAP 11/20/2011   Prediabetes 09/16/2010   EDEMA 01/08/2010   PLEURAL EFFUSION 08/27/2009   ENLARGEMENT OF LYMPH NODES 08/13/2009  History of pulmonary embolism 05/24/2009   Morbid obesity ([place]) 12/08/2007   Hypothyroidism 06/09/2006   HYPERCHOLESTEROLEMIA 06/09/2006   Generalized anxiety disorder 06/09/2006   PANIC DISORDER 06/09/2006   Essential hypertension 06/09/2006   GLOMERULONEPHRITIS 06/09/2006   Systemic lupus erythematosus ([place]) 06/09/2006   DVT, HX OF 06/09/2006    Past Surgical History:  Procedure Laterality Date   AORTIC VALVE REPLACEMENT N/A 04/06/2020   Procedure: AORTIC VALVE REPLACEMENT (AVR) USING INSPIRIS 21 MM AORTIC  VALVE;  Surgeon: [name], MD;  Location: [facility];  Service: Open Heart Surgery;  Laterality: N/A;   BIOPSY  04/12/2019   Procedure: BIOPSY;  Surgeon: Thornton Park, MD;  Location: [facility];  Service: Gastroenterology;;   ESOPHAGEAL MANOMETRY N/A 07/15/2019   Procedure: ESOPHAGEAL MANOMETRY (EM);  Surgeon: Mauri Pole, MD;  Location: [facility];  Service: Endoscopy;  Laterality: N/A;   ESOPHAGOGASTRODUODENOSCOPY (EGD) WITH PROPOFOL N/A 04/12/2019   Procedure: ESOPHAGOGASTRODUODENOSCOPY (EGD) WITH PROPOFOL;  Surgeon: Thornton Park, MD;  Location: [facility];  Service: Gastroenterology;  Laterality: N/A;   gastric sleeve  8/12   bariatric surgery Dr Evorn Gong    LAPAROSCOPIC TUBAL LIGATION  12/09/2010   Procedure: LAPAROSCOPIC TUBAL LIGATION;  Surgeon: Darlyn Chamber;  Location: [facility];  Service: Gynecology;  Laterality: Bilateral;  Attempted see Nursing note   pleurocentesis  10/2004   Pleuryx catheter placement  9/11    VanTright----removed 9/11   RENAL BIOPSY  09/24/2012   RIGHT/LEFT HEART CATH AND CORONARY ANGIOGRAPHY N/A 01/19/2020   Procedure: RIGHT/LEFT HEART CATH AND CORONARY ANGIOGRAPHY;  Surgeon: Lorretta Harp, MD;  Location: [facility];  Service: Cardiovascular;  Laterality: N/A;   svd     x 3   TEE WITHOUT CARDIOVERSION N/A 04/06/2020   Procedure: TRANSESOPHAGEAL ECHOCARDIOGRAM (TEE);  Surgeon: [name], MD;  Location: [facility];  Service: Open Heart Surgery;  Laterality: N/A;   THORACENTESIS  2010   with penumonia   WISDOM TOOTH EXTRACTION      OB History   No obstetric history on file.      Home Medications    Prior to Admission medications   Medication Sig Start Date End Date Taking? Authorizing Provider  albuterol (VENTOLIN HFA) 108 (90 Base) MCG/ACT inhaler Inhale 1-2 puffs into the lungs every 6 (six) hours as needed for wheezing or shortness of breath. 08/13/20  Yes Volney American, PA-C  promethazine-dextromethorphan  (PROMETHAZINE-DM) 6.25-15 MG/5ML syrup Take 5 mLs by mouth 4 (four) times daily as needed for cough. 08/13/20  Yes Volney American, PA-C  amoxicillin (AMOXIL) 500 MG tablet Take 4 tabs (2000 mg) 30-60 min before dental procedures 04/30/20   Richardson Dopp T, PA-C  aspirin EC 81 MG EC tablet Take 1 tablet (81 mg total) by mouth daily. Swallow whole. 04/11/20   Nani Skillern, PA-C  bismuth subsalicylate (PEPTO BISMOL) 262 MG/15ML suspension Take 30 mLs by mouth every 6 (six) hours as needed for indigestion or diarrhea or loose stools.    [provider]  calcium carbonate (TUMS - DOSED IN MG ELEMENTAL CALCIUM) 500 MG chewable tablet Chew 1-2 tablets by mouth 3 (three) times daily as needed for indigestion or heartburn.    [provider]  cyanocobalamin (,VITAMIN B-12,) 1000 MCG/ML injection Inject 1,000 mcg into the muscle every 30 (thirty) days.  10/06/19   [provider]  ezetimibe (ZETIA) 10 MG tablet Take 1 tablet (10 mg total) by mouth daily. 07/11/20   [name],  MD  hydroxychloroquine (PLAQUENIL) 200 MG tablet Take 200 mg by mouth 2 (two) times daily.    [provider]  levothyroxine (SYNTHROID) 200 MCG tablet Take 200 mcg by mouth daily before breakfast.    [provider]  metoprolol tartrate (LOPRESSOR) 50 MG tablet Take 1 tablet (50 mg total) by mouth 2 (two) times daily. 06/04/20   [name], MD  Multiple Vitamins-Minerals (BARIATRIC MULTIVITAMINS/IRON) CAPS Take 1 tablet by mouth daily.    [provider]  omeprazole (PRILOSEC) 20 MG capsule TAKE 1 CAPSULE (20 MG TOTAL) BY MOUTH 2 (TWO) TIMES DAILY BEFORE A MEAL. 04/29/20   Thornton Park, MD  PARoxetine (PAXIL) 40 MG tablet TAKE 1 TABLET BY MOUTH EVERY DAY IN THE MORNING 01/26/20   Tower, [name], MD  rosuvastatin (CRESTOR) 20 MG tablet TAKE 1 TABLET BY MOUTH EVERY DAY 01/26/20   Tower, [name], MD  warfarin (COUMADIN) 7.5 MG tablet Daily or as directed 04/11/20    Nani Skillern, PA-C  enoxaparin (LOVENOX) 150 MG/ML injection Inject 1 mL (150 mg total) into the skin 2 (two) times daily for 7 days. Patient not taking: Reported on 01/26/2020 01/20/20 01/26/20  Geradine Girt, DO    Family History Family History  Problem Relation Age of Onset   Lupus Sister    Hypertension Father    Hyperlipidemia Father    Leukemia Sister    Diabetes Other        GM   Colon cancer Neg Hx    Esophageal cancer Neg Hx    Pancreatic cancer Neg Hx     Social History Social History   Tobacco Use   Smoking status: Former    Packs/day: 0.50    Years: 10.00    Pack years: 5.00    Types: Cigarettes    Quit date: 05/04/2009    Years since quitting: 11.2   Smokeless tobacco: Never   Tobacco comments:    Socially x10 years (1 pack/month)  Vaping Use   Vaping Use: Never used  Substance Use Topics   Alcohol use: Not Currently    Alcohol/week: 0.0 standard drinks    Comment: rare   Drug use: No     Allergies   Lisinopril, Lovenox [enoxaparin sodium], Moxifloxacin, and Lovenox [enoxaparin]   Review of Systems Review of Systems Per HPI  Physical Exam Triage Vital Signs ED Triage Vitals [08/13/20 1307]  Enc Vitals Group     BP 130/78     Pulse Rate 65     Resp 12     Temp 98.4 F (36.9 C)     Temp Source Oral     SpO2 93 %     Weight      Height      Head Circumference      Peak Flow      Pain Score      Pain Loc      Pain Edu?      Excl. in [place]?    No data found.  Updated Vital Signs BP 130/78 (BP Location: Left Arm)   Pulse 65   Temp 98.4 F (36.9 C) (Oral)   Resp 12   LMP 11/08/2010   SpO2 93%   Visual Acuity Right Eye Distance:   Left Eye Distance:   Bilateral Distance:    Right Eye Near:   Left Eye Near:    Bilateral Near:     Physical Exam Vitals and nursing note reviewed.  Constitutional:  Appearance: Normal appearance. She is not ill-appearing.  HENT:     Head: Atraumatic.     Right Ear: Tympanic  membrane normal.     Left Ear: Tympanic membrane normal.     Nose: Rhinorrhea present.     Mouth/Throat:     Mouth: Mucous membranes are moist.     Pharynx: Posterior oropharyngeal erythema present. No oropharyngeal exudate.  Eyes:     Extraocular Movements: Extraocular movements intact.     Conjunctiva/sclera: Conjunctivae normal.     Pupils: Pupils are equal, round, and reactive to light.  Cardiovascular:     Rate and Rhythm: Normal rate and regular rhythm.     Heart sounds: Normal heart sounds.  Pulmonary:     Effort: Pulmonary effort is normal. No respiratory distress.     Breath sounds: Normal breath sounds. No wheezing.     Comments: Mildly decreased breath sounds left lower lung Abdominal:     General: Bowel sounds are normal. There is no distension.     Palpations: Abdomen is soft.     Tenderness: There is no abdominal tenderness. There is no guarding.  Musculoskeletal:        General: Normal range of motion.     Cervical back: Normal range of motion and neck supple.  Skin:    General: Skin is warm and dry.     Findings: No rash.  Neurological:     Mental Status: She is alert and oriented to person, place, and time.     Motor: No weakness.     Gait: Gait normal.  Psychiatric:        Mood and Affect: Mood normal.        Thought Content: Thought content normal.        Judgment: Judgment normal.     UC Treatments / Results  Labs (all labs ordered are listed, but only abnormal results are displayed) Labs Reviewed - No data to display  EKG   Radiology DG Chest 2 View  Result Date: 08/13/2020 CLINICAL DATA:  Cough, shortness of breath, COVID-19 positive EXAM: CHEST - 2 VIEW COMPARISON:  05/17/2020 FINDINGS: Prior median sternotomy and cardiac valve replacement. Stable cardiomediastinal contours. Large peripherally left-sided pleural fluid collection is unchanged. No new airspace consolidation is identified. No pleural effusion or pneumothorax. IMPRESSION: No  new/acute cardiopulmonary findings. Unchanged large left-sided pleural fluid collection. Electronically Signed   By: Davina Poke D.O.   On: 08/13/2020 14:05    Procedures Procedures (including critical care time)  Medications Ordered in UC Medications - No data to display  Initial Impression / Assessment and Plan / UC Course  I have reviewed the triage vital signs and the nursing notes.  Pertinent labs & imaging results that were available during my care of the patient were reviewed by me and considered in my medical decision making (see chart for details).     Normal EKG normal EKG vital signs overall reassuring today, oxygen saturation is 93% on room air and she is in no acute distress including when ambulating.  Chest x-ray today showing stable large left pleural effusion which appears to be chronic looking back at previous x-rays from her empyema event.  She does not currently have a pulse oximeter at home but plans to go get one on her way home so that she can monitor her oxygen saturations.  Discussed worrisome ranges with that, will give albuterol inhaler, Phenergan DM and continue plain Mucinex for supportive medications and discussed good home care.  Close PCP follow-up recommended and knows to go to the emergency department if symptoms worsen at any point time.  Patient verbalizes understanding and is agreeable to plan.  Final Clinical Impressions(s) / UC Diagnoses   Final diagnoses:  COVID-19  Cough  Wheezing  Hx of pleural empyema  Hx of pleural effusion   Discharge Instructions   None    ED Prescriptions     Medication Sig Dispense Auth. Provider   promethazine-dextromethorphan (PROMETHAZINE-DM) 6.25-15 MG/5ML syrup Take 5 mLs by mouth 4 (four) times daily as needed for cough. 100 mL Volney American, PA-C   albuterol (VENTOLIN HFA) 108 (90 Base) MCG/ACT inhaler Inhale 1-2 puffs into the lungs every 6 (six) hours as needed for wheezing or shortness of breath.  18 g Volney American, [place]      PDMP not reviewed this encounter.   Volney American, [place] 08/13/20 1516

## 2020-08-17 ENCOUNTER — Telehealth (HOSPITAL_COMMUNITY): Payer: Self-pay

## 2020-08-17 NOTE — Telephone Encounter (Signed)
Pt called and stated she is now interested in CR, adv pt of backlog of 1-3 months.

## 2020-08-21 NOTE — Telephone Encounter (Signed)
I want her to wait 5 more days and re test again Glad she is feeling better

## 2020-08-21 NOTE — Telephone Encounter (Signed)
Pt left v/m that she tested + for covid on 08/10/20; per pt symptoms started on 08/10/20. No fever but had chills on 08/11/20 & 08/12/20. Pt was seen at [facility] and got cxr on 08/13/20. Pt said she had had improvement in symptoms but when tested recently for covid still results as +. Pt has new grandbaby that lives in same house  and pt continues to quarantine but pt wants to know when can she come off quarantine and see her grandbaby and also how long will covid results remain +.sending note to Dr Milinda Antis.

## 2020-08-21 NOTE — Telephone Encounter (Signed)
Before I call pt please answer last 2 questions on message:  Pt has new grandbaby that lives in same house  and pt continues to quarantine but pt wants to know when can she come off quarantine and see her grandbaby and also how long will covid results remain +.sending note to Dr Milinda Antis.

## 2020-08-21 NOTE — Telephone Encounter (Signed)
We don't know exactly how long one stays contagious with a positive rapid test.  Usually tests turn negative after about 10 days but given her immunocomp status it may take longer for her to fight it.  I would continue to quarantine another 5 days and then re test and let us know then what the result is and how she is feeling (as long as she has symptoms she should quarantine anyway)  It sounds like she is feeling better   If she must come out of quarantine then please wear high quality mask like KN95 or better

## 2020-08-22 ENCOUNTER — Encounter: Payer: Self-pay | Admitting: Hematology and Oncology

## 2020-08-22 ENCOUNTER — Telehealth (HOSPITAL_COMMUNITY): Payer: Self-pay

## 2020-08-22 NOTE — Telephone Encounter (Signed)
Pt insurance is active and benefits verified through [place]. Co-pay $0.00, DED $1,600.00/$1,600.00 met, out of pocket $3,250.00/$3,250.00 met, co-insurance 10%. No pre-authorization required. Passport, 08/22/20 @ 8:48AM, VCB#[id number]

## 2020-08-22 NOTE — Telephone Encounter (Signed)
Left VM requesting pt to call the office back 

## 2020-08-23 ENCOUNTER — Telehealth (HOSPITAL_COMMUNITY): Payer: Self-pay

## 2020-08-23 ENCOUNTER — Other Ambulatory Visit: Payer: 59

## 2020-08-23 ENCOUNTER — Ambulatory Visit: Payer: 59 | Admitting: Hematology and Oncology

## 2020-08-23 NOTE — Telephone Encounter (Signed)
Called patient to see if she was interested in participating in the Cardiac Rehab Program. Patient stated yes. Patient will come in for orientation on 09/20/2020@9 :00am and will attend the 3:15pm exercise class.   Pensions consultant.

## 2020-08-29 NOTE — Telephone Encounter (Signed)
Left 2nd VM requesting pt to call the office back  

## 2020-08-30 NOTE — Telephone Encounter (Signed)
Called pt again and no answer, will close note and if pt calls back will address then

## 2020-08-31 ENCOUNTER — Other Ambulatory Visit: Payer: Self-pay | Admitting: Physician Assistant

## 2020-08-31 DIAGNOSIS — Z7901 Long term (current) use of anticoagulants: Secondary | ICD-10-CM

## 2020-09-04 NOTE — Telephone Encounter (Signed)
Called  pt to see if she would like to come in sooner for cardiac rehab. LMTCB

## 2020-09-05 ENCOUNTER — Ambulatory Visit: Payer: 59 | Admitting: Hematology and Oncology

## 2020-09-05 ENCOUNTER — Other Ambulatory Visit: Payer: 59

## 2020-09-07 ENCOUNTER — Encounter: Payer: Self-pay | Admitting: Hematology and Oncology

## 2020-09-07 ENCOUNTER — Other Ambulatory Visit: Payer: 59 | Admitting: *Deleted

## 2020-09-07 ENCOUNTER — Other Ambulatory Visit: Payer: Self-pay

## 2020-09-07 DIAGNOSIS — E7800 Pure hypercholesterolemia, unspecified: Secondary | ICD-10-CM

## 2020-09-07 LAB — LIPID PANEL
Chol/HDL Ratio: 2.9 ratio (ref 0.0–4.4)
Cholesterol, Total: 163 mg/dL (ref 100–199)
HDL: 57 mg/dL (ref >39)
LDL Chol Calc (NIH): 91 mg/dL (ref 0–99)
Triglycerides: 79 mg/dL (ref 0–149)
VLDL Cholesterol Cal: 15 mg/dL (ref 5–40)

## 2020-09-19 ENCOUNTER — Telehealth (HOSPITAL_COMMUNITY): Payer: Self-pay | Admitting: *Deleted

## 2020-09-19 NOTE — Telephone Encounter (Signed)
Called [name] to confirm that she was coming to her CR orientation appointment. I was unable to reach her but left her a message for her to return my call.

## 2020-09-20 ENCOUNTER — Encounter (HOSPITAL_COMMUNITY): Payer: Self-pay

## 2020-09-20 ENCOUNTER — Encounter (HOSPITAL_COMMUNITY)
Admission: RE | Admit: 2020-09-20 | Discharge: 2020-09-20 | Disposition: A | Discharge status: Home / Self Care | Payer: 59 | Source: Ambulatory Visit | Attending: Internal Medicine | Admitting: Internal Medicine

## 2020-09-20 ENCOUNTER — Telehealth: Payer: Self-pay | Admitting: *Deleted

## 2020-09-20 ENCOUNTER — Other Ambulatory Visit: Payer: Self-pay

## 2020-09-20 VITALS — BP 108/74 | HR 71 | Ht 66.25 in | Wt 314.4 lb

## 2020-09-20 DIAGNOSIS — Z48812 Encounter for surgical aftercare following surgery on the circulatory system: Secondary | ICD-10-CM | POA: Insufficient documentation

## 2020-09-20 DIAGNOSIS — Z952 Presence of prosthetic heart valve: Secondary | ICD-10-CM

## 2020-09-20 DIAGNOSIS — E7800 Pure hypercholesterolemia, unspecified: Secondary | ICD-10-CM

## 2020-09-20 MED ORDER — ROSUVASTATIN CALCIUM 40 MG PO TABS: 40.0000 mg | ORAL_TABLET | Freq: Every day | ORAL | 3 refills | Status: DC

## 2020-09-20 NOTE — Telephone Encounter (Signed)
Patient voices understanding and agreement with lab results and recommendations.

## 2020-09-20 NOTE — Progress Notes (Signed)
Cardiac Individual Treatment Plan  Patient Details  Name: [name] MRN: [id number] Date of Birth: 1965-10-03 Referring Provider:   Flowsheet Row CARDIAC REHAB PHASE II ORIENTATION from 09/20/2020 in [place]  Referring Provider Dr. [name], MD       Initial Encounter Date:  [place] PHASE II ORIENTATION from 09/20/2020 in [place]  Date 09/20/20       Visit Diagnosis: 04/06/20 S/P AVR (aortic valve replacement)  Patient's Home Medications on Admission:  Current Outpatient Medications:    albuterol (VENTOLIN HFA) 108 (90 Base) MCG/ACT inhaler, Inhale 1-2 puffs into the lungs every 6 (six) hours as needed for wheezing or shortness of breath., Disp: 18 g, Rfl: 0   amoxicillin (AMOXIL) 500 MG tablet, Take 4 tabs (2000 mg) 30-60 min before dental procedures, Disp: 4 tablet, Rfl: 1   aspirin EC 81 MG EC tablet, Take 1 tablet (81 mg total) by mouth daily. Swallow whole., Disp: 30 tablet, Rfl: 11   bismuth subsalicylate (PEPTO BISMOL) 262 MG/15ML suspension, Take 30 mLs by mouth every 6 (six) hours as needed for indigestion or diarrhea or loose stools., Disp: , Rfl:    calcium carbonate (TUMS - DOSED IN MG ELEMENTAL CALCIUM) 500 MG chewable tablet, Chew 1-2 tablets by mouth 3 (three) times daily as needed for indigestion or heartburn., Disp: , Rfl:    cyanocobalamin (,VITAMIN B-12,) 1000 MCG/ML injection, Inject 1,000 mcg into the muscle every 30 (thirty) days. , Disp: , Rfl:    ezetimibe (ZETIA) 10 MG tablet, Take 1 tablet (10 mg total) by mouth daily., Disp: 90 tablet, Rfl: 3   hydroxychloroquine (PLAQUENIL) 200 MG tablet, Take 200 mg by mouth 2 (two) times daily., Disp: , Rfl:    levothyroxine (SYNTHROID) 200 MCG tablet, Take 200 mcg by mouth daily before breakfast., Disp: , Rfl:    metoprolol tartrate (LOPRESSOR) 50 MG tablet, Take 1 tablet (50 mg total) by mouth 2 (two) times daily., Disp: 180  tablet, Rfl: 3   Multiple Vitamins-Minerals (BARIATRIC MULTIVITAMINS/IRON) CAPS, Take 1 tablet by mouth daily., Disp: , Rfl:    omeprazole (PRILOSEC) 20 MG capsule, TAKE 1 CAPSULE (20 MG TOTAL) BY MOUTH 2 (TWO) TIMES DAILY BEFORE A MEAL., Disp: 180 capsule, Rfl: 1   PARoxetine (PAXIL) 40 MG tablet, TAKE 1 TABLET BY MOUTH EVERY DAY IN THE MORNING, Disp: 90 tablet, Rfl: 3   rosuvastatin (CRESTOR) 20 MG tablet, TAKE 1 TABLET BY MOUTH EVERY DAY, Disp: 90 tablet, Rfl: 3   warfarin (COUMADIN) 7.5 MG tablet, Daily or as directed (Patient taking differently: Take 7.5-11.25 mg by mouth See admin instructions. Take 1 and 1/2 tablets on Monday, Wednesday and Friday then take 1 tablet all the other days), Disp: 145 tablet, Rfl: 0   promethazine-dextromethorphan (PROMETHAZINE-DM) 6.25-15 MG/5ML syrup, Take 5 mLs by mouth 4 (four) times daily as needed for cough. 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   GERD (gastroesophageal reflux disease)  on prilosec r/t gastric sleeve surgery   HLD (hyperlipidemia)    Iron deficiency anemia    IV dextran [place]   Nephritis and nephropathy, not specified as acute or chronic, with unspecified pathological lesion in kidney    Nonspecific abnormal results of liver function study    Obesity, unspecified    Other specified acquired hypothyroidism    Panic disorder without agoraphobia    Personal history of venous thrombosis and embolism 2002   during pregnancy; ?factor 5 leiden (sees heme)   Pleural effusion 2005   c/w lupus initial w/u; recurrent Right as pred  tapered off July 2011   Sleep apnea    Systemic lupus erythematosus ([place])    renal GN, hx of pericardial eff in late 90's   Unspecified essential hypertension     Tobacco Use: Social History   Tobacco Use  Smoking Status Former   Packs/day: 0.50   Years: 10.00   Pack years: 5.00   Types: Cigarettes   Quit date: 05/04/2009   Years since quitting: 11.3  Smokeless Tobacco Never  Tobacco Comments   Socially x10 years (1 pack/month)    Labs: Recent Review Flowsheet Data     Labs for ITP Cardiac and Pulmonary Rehab Latest Ref Rng & Units 04/06/2020 04/06/2020 04/06/2020 07/06/2020 09/07/2020   Cholestrol 100 - 199 mg/dL - - - 185 163   LDLCALC 0 - 99 mg/dL - - - 109(H) 91   LDLDIRECT mg/dL - - - - -   HDL >39 mg/dL - - - 57 57   Trlycerides 0 - 149 mg/dL - - - 105 79   Hemoglobin A1c 4.8 - 5.6 % - - - - -   PHART 7.350 - 7.450 7.284(L) 7.329(L) 7.285(L) - -   PCO2ART 32.0 - 48.0 mmHg 51.6(H) 51.0(H) 53.7(H) - -   HCO3 20.0 - 28.0 mmol/L 24.8 27.0 25.7 - -   TCO2 22 - 32 mmol/L _0 - -   ACIDBASEDEF 0.0 - 2.0 mmol/L 2.0 - 1.0 - -   O2SAT % 97.0 99.0 99.0 - -       Capillary Blood Glucose: Lab Results  Component Value Date   GLUCAP 104 (H) 04/09/2020   GLUCAP 99 04/09/2020   GLUCAP 88 04/09/2020   GLUCAP 140 (H) 04/08/2020   GLUCAP 108 (H) 04/08/2020     Exercise Target Goals: Exercise Program Goal: Individual exercise prescription set using results from initial 6 min walk test and THRR while considering  patient's activity barriers and safety.   Exercise Prescription Goal: Starting with aerobic activity 30 plus minutes a day, 3 days per week for initial exercise prescription. Provide home exercise prescription and guidelines that participant acknowledges understanding prior to discharge.  Activity Barriers & Risk Stratification:  Activity Barriers & Cardiac Risk Stratification - 09/20/20 1058       Activity Barriers & Cardiac Risk Stratification   Activity Barriers  Balance Concerns;Deconditioning;History of Falls;Muscular Weakness    Cardiac Risk Stratification High             6 Minute Walk:  6 Minute Walk     Row Name 09/20/20 1056         6 Minute Walk   Phase Initial     Distance 857 feet     Walk Time 6 minutes     # of Rest Breaks 0     MPH 1.62     METS 1.72     RPE 13  Perceived Dyspnea  0     VO2 Peak 6.02     Symptoms Yes (comment)     Comments Left leg muscle fatigue     Resting HR 71 bpm     Resting BP 108/74     Resting Oxygen Saturation  95 %     Exercise Oxygen Saturation  during 6 min walk 90 %     Max Ex. HR 96 bpm     Max Ex. BP 140/78     2 Minute Post BP 112/68              Oxygen Initial Assessment:   Oxygen Re-Evaluation:   Oxygen Discharge (Final Oxygen Re-Evaluation):   Initial Exercise Prescription:  Initial Exercise Prescription - 09/20/20 1100       Date of Initial Exercise RX and Referring Provider   Date 09/20/20    Referring Provider Dr. [name], MD    Expected Discharge Date 11/16/20      T5 Nustep   Level 1    SPM 70    Minutes 20    METs 1.4      Intensity   THRR 40-80% of Max Heartrate 66-133    Ratings of Perceived Exertion 11-13    Perceived Dyspnea 0-4      Progression   Progression Continue progressive overload as per policy without signs/symptoms or physical distress.      Resistance Training   Training Prescription Yes    Weight 3    Reps 10-15             Perform Capillary Blood Glucose checks as needed.  Exercise Prescription Changes:   Exercise Comments:   Exercise Goals and Review:   Exercise Goals     Row Name 09/20/20 1103             Exercise Goals   Increase Physical Activity Yes       Intervention Provide advice, education, support and counseling about physical activity/exercise needs.;Develop an individualized exercise prescription for aerobic and resistive training based on initial evaluation findings, risk  stratification, comorbidities and participant's personal goals.       Expected Outcomes Short Term: Attend rehab on a regular basis to increase amount of physical activity.;Long Term: Add in home exercise to make exercise part of routine and to increase amount of physical activity.;Long Term: Exercising regularly at least 3-5 days a week.       Increase Strength and Stamina Yes       Intervention Provide advice, education, support and counseling about physical activity/exercise needs.;Develop an individualized exercise prescription for aerobic and resistive training based on initial evaluation findings, risk stratification, comorbidities and participant's personal goals.       Expected Outcomes Short Term: Increase workloads from initial exercise prescription for resistance, speed, and METs.;Short Term: Perform resistance training exercises routinely during rehab and add in resistance training at home;Long Term: Improve cardiorespiratory fitness, muscular endurance and strength as measured by increased METs and functional capacity (6MWT)       Able to understand and use rate of perceived exertion (RPE) scale Yes       Intervention Provide education and explanation on how to use RPE scale       Expected Outcomes Short Term: Able to use RPE daily in rehab to express subjective intensity level;Long Term:  Able to use RPE to guide intensity level when exercising independently       Knowledge and understanding of Target Heart Rate Range (  THRR) Yes       Intervention Provide education and explanation of THRR including how the numbers were predicted and where they are located for reference       Expected Outcomes Short Term: Able to state/look up THRR;Long Term: Able to use THRR to govern intensity when exercising independently;Short Term: Able to use daily as guideline for intensity in rehab       Understanding of Exercise Prescription Yes       Intervention Provide education, explanation, and written  materials on patient's individual exercise prescription       Expected Outcomes Short Term: Able to explain program exercise prescription;Long Term: Able to explain home exercise prescription to exercise independently                Exercise Goals Re-Evaluation :    Discharge Exercise Prescription (Final Exercise Prescription Changes):   Nutrition:  Target Goals: Understanding of nutrition guidelines, daily intake of sodium <1550m, cholesterol <2055m calories 30% from fat and 7% or less from saturated fats, daily to have 5 or more servings of fruits and vegetables.  Biometrics:  Pre Biometrics - 09/20/20 1058       Pre Biometrics   Waist Circumference 60.5 inches    Hip Circumference 61.5 inches    Waist to Hip Ratio 0.98 %    Triceps Skinfold 47 mm    % Body Fat 61.7 %    Grip Strength 28 kg    Flexibility 5 in    Single Leg Stand 16.1 seconds              Nutrition Therapy Plan and Nutrition Goals:   Nutrition Assessments:  MEDIFICTS Score Key: ?70 Need to make dietary changes  40-70 Heart Healthy Diet ? 40 Therapeutic Level Cholesterol Diet   Picture Your Plate Scores: <4<99nhealthy dietary pattern with much room for improvement. 41-50 Dietary pattern unlikely to meet recommendations for good health and room for improvement. 51-60 More healthful dietary pattern, with some room for improvement.  >60 Healthy dietary pattern, although there may be some specific behaviors that could be improved.    Nutrition Goals Re-Evaluation:   Nutrition Goals Discharge (Final Nutrition Goals Re-Evaluation):   Psychosocial: Target Goals: Acknowledge presence or absence of significant depression and/or stress, maximize coping skills, provide positive support system. Participant is able to verbalize types and ability to use techniques and skills needed for reducing stress and depression.  Initial Review & Psychosocial Screening:  Initial Psych Review & Screening -  09/20/20 1000       Initial Review   Current issues with Current Stress Concerns    Source of Stress Concerns Family    Comments [name]'s daughter and infant grandson live with her, history of panic disorder      Family Dynamics   Good Support System? Yes   [name] with her daughter and has 2 son's for support     Barriers   Psychosocial barriers to participate in program The patient should benefit from training in stress management and relaxation.      Screening Interventions   Interventions Encouraged to exercise;Provide feedback about the scores to participant;To provide support and resources with identified psychosocial needs    Expected Outcomes Long Term Goal: Stressors or current issues are controlled or eliminated.;Short Term goal: Identification and review with participant of any Quality of Life or Depression concerns found by scoring the questionnaire.             Quality of  Life Scores:  Quality of Life - 09/20/20 1105       Quality of Life   Select Quality of Life      Quality of Life Scores   Health/Function Pre 18.17 %    Socioeconomic Pre 18.86 %    Psych/Spiritual Pre 20.79 %    Family Pre 15.6 %    GLOBAL Pre 18.47 %            Scores of 19 and below usually indicate a poorer quality of life in these areas.  A difference of  2-3 points is a clinically meaningful difference.  A difference of 2-3 points in the total score of the Quality of Life Index has been associated with significant improvement in overall quality of life, self-image, physical symptoms, and general health in studies assessing change in quality of life.  PHQ-9: Recent Review Flowsheet Data     Depression screen [facility] 2/9 09/20/2020 01/04/2019 11/16/2017 10/03/2016   Decreased Interest 0 0 1 0   Down, Depressed, Hopeless 0 0 0 0   PHQ - 2 Score 0 0 1 0   Altered sleeping - 1 - 0   Tired, decreased energy - 1 - 2   Change in appetite - 1 - 1   Feeling bad or failure about yourself  -  1 - 0   Trouble concentrating - 0 - 0   Moving slowly or fidgety/restless - 0 - 0   Suicidal thoughts - 0 - 0   PHQ-9 Score - 4 - 3      Interpretation of Total Score  Total Score Depression Severity:  1-4 = Minimal depression, 5-9 = Mild depression, 10-14 = Moderate depression, 15-19 = Moderately severe depression, 20-27 = Severe depression   Psychosocial Evaluation and Intervention:   Psychosocial Re-Evaluation:   Psychosocial Discharge (Final Psychosocial Re-Evaluation):   Vocational Rehabilitation: Provide vocational rehab assistance to qualifying candidates.   Vocational Rehab Evaluation & Intervention:  Vocational Rehab - 09/20/20 1001       Initial Vocational Rehab Evaluation & Intervention   Assessment shows need for Vocational Rehabilitation No   [name] works full time and does not need vocational rehab at this time            Education: Education Goals: Education classes will be provided on a weekly basis, covering required topics. Participant will state understanding/return demonstration of topics presented.  Learning Barriers/Preferences:  Learning Barriers/Preferences - 09/20/20 1106       Learning Barriers/Preferences   Learning Barriers Sight   Pt wears contacts   Learning Preferences Audio;Computer/Internet;Group Instruction;Individual Instruction;Pictoral;Skilled Demonstration;Verbal Instruction;Video;Written Material             Education Topics: Hypertension, Hypertension Reduction -Define heart disease and high blood pressure. Discus how high blood pressure affects the body and ways to reduce high blood pressure.   Exercise and Your Heart -Discuss why it is important to exercise, the FITT principles of exercise, normal and abnormal responses to exercise, and how to exercise safely.   Angina -Discuss definition of angina, causes of angina, treatment of angina, and how to decrease risk of having angina.   Cardiac Medications -Review  what the following cardiac medications are used for, how they affect the body, and side effects that may occur when taking the medications.  Medications include Aspirin, Beta blockers, calcium channel blockers, ACE Inhibitors, angiotensin receptor blockers, diuretics, digoxin, and antihyperlipidemics.   Congestive Heart Failure -Discuss the definition of CHF, how to live with CHF,  the signs and symptoms of CHF, and how keep track of weight and sodium intake.   Heart Disease and Intimacy -Discus the effect sexual activity has on the heart, how changes occur during intimacy as we age, and safety during sexual activity.   Smoking Cessation / COPD -Discuss different methods to quit smoking, the health benefits of quitting smoking, and the definition of COPD.   Nutrition I: Fats -Discuss the types of cholesterol, what cholesterol does to the heart, and how cholesterol levels can be controlled.   Nutrition II: Labels -Discuss the different components of food labels and how to read food label   Heart Parts/Heart Disease and PAD -Discuss the anatomy of the heart, the pathway of blood circulation through the heart, and these are affected by heart disease.   Stress I: Signs and Symptoms -Discuss the causes of stress, how stress may lead to anxiety and depression, and ways to limit stress.   Stress II: Relaxation -Discuss different types of relaxation techniques to limit stress.   Warning Signs of Stroke / TIA -Discuss definition of a stroke, what the signs and symptoms are of a stroke, and how to identify when someone is having stroke.   Knowledge Questionnaire Score:  Knowledge Questionnaire Score - 09/20/20 1106       Knowledge Questionnaire Score   Pre Score 23/24             Core Components/Risk Factors/Patient Goals at Admission:  Personal Goals and Risk Factors at Admission - 09/20/20 1108       Core Components/Risk Factors/Patient Goals on Admission    Weight  Management Yes;Weight Loss    Intervention Weight Management: Develop a combined nutrition and exercise program designed to reach desired caloric intake, while maintaining appropriate intake of nutrient and fiber, sodium and fats, and appropriate energy expenditure required for the weight goal.;Weight Management: Provide education and appropriate resources to help participant work on and attain dietary goals.;Weight Management/Obesity: Establish reasonable short term and long term weight goals.;Obesity: Provide education and appropriate resources to help participant work on and attain dietary goals.    Admit Weight 314 lb 6 oz (142.6 kg)    Expected Outcomes Short Term: Continue to assess and modify interventions until short term weight is achieved;Long Term: Adherence to nutrition and physical activity/exercise program aimed toward attainment of established weight goal;Weight Loss: Understanding of general recommendations for a balanced deficit meal plan, which promotes 1-2 lb weight loss per week and includes a negative energy balance of [phone] kcal/d;Understanding recommendations for meals to include 15-35% energy as protein, 25-35% energy from fat, 35-60% energy from carbohydrates, less than 269m of dietary cholesterol, 20-35 gm of total fiber daily;Understanding of distribution of calorie intake throughout the day with the consumption of 4-5 meals/snacks    Hypertension Yes    Intervention Provide education on lifestyle modifcations including regular physical activity/exercise, weight management, moderate sodium restriction and increased consumption of fresh fruit, vegetables, and low fat dairy, alcohol moderation, and smoking cessation.;Monitor prescription use compliance.    Expected Outcomes Short Term: Continued assessment and intervention until BP is < 140/955mHG in hypertensive participants. < 130/8031mG in hypertensive participants with diabetes, heart failure or chronic kidney disease.;Long  Term: Maintenance of blood pressure at goal levels.    Lipids Yes    Intervention Provide education and support for participant on nutrition & aerobic/resistive exercise along with prescribed medications to achieve LDL <39m15mDL >40mg50m Expected Outcomes Short Term: Participant states understanding of  desired cholesterol values and is compliant with medications prescribed. Participant is following exercise prescription and nutrition guidelines.;Long Term: Cholesterol controlled with medications as prescribed, with individualized exercise RX and with personalized nutrition plan. Value goals: LDL < 60m, HDL > 40 mg.    Stress Yes    Intervention Offer individual and/or small group education and counseling on adjustment to heart disease, stress management and health-related lifestyle change. Teach and support self-help strategies.;Refer participants experiencing significant psychosocial distress to appropriate mental health specialists for further evaluation and treatment. When possible, include family members and significant others in education/counseling sessions.    Expected Outcomes Short Term: Participant demonstrates changes in health-related behavior, relaxation and other stress management skills, ability to obtain effective social support, and compliance with psychotropic medications if prescribed.;Long Term: Emotional wellbeing is indicated by absence of clinically significant psychosocial distress or social isolation.    Personal Goal Other Yes    Personal Goal Short term: Create a home exercise plan. Long term: Rollar skate, increase mobility, lose wt    Intervention Will continue to monitor pt and progress workloads as tolerated without sign or symptom    Expected Outcomes Pt will achieve her goals             Core Components/Risk Factors/Patient Goals Review:    Core Components/Risk Factors/Patient Goals at Discharge (Final Review):    ITP Comments:  ITP Comments     Row Name  09/20/20 0959           ITP Comments Dr TFransico HimMD, Medical Director                Comments: LCecille Rubinattended orientation on 09/20/2020 to review rules and guidelines for program.  Completed 6 minute walk test, Intitial ITP, and exercise prescription.  VSS. Telemetry-Sinus Rhythm. Oxygen saturation pre walk test 95% on room air. Post 6 minute walk test 90% on room air. Exit oxygen saturation 96% on room air . [name] deconditioned and did report left leg fatigue. Denied shortness of breath. Lung fields clear upon ausculation.   Safety measures and social distancing in place per CDC guidelines. Will monitor oxygen saturations during exercise next week.MBarnet Pall RN,BSN 09/20/2020 12:17 PM

## 2020-09-20 NOTE — Progress Notes (Addendum)
Cardiac Rehab Medication Review by a Nurse  Does the patient  feel that his/her medications are working for him/her?  YES   Has the patient been experiencing any side effects to the medications prescribed?   NO  Does the patient measure his/her own blood pressure or blood glucose at home?  YES   Does the patient have any problems obtaining medications due to transportation or finances?    NO  Understanding of regimen: good Understanding of indications: good Potential of compliance: good    Nurse comments: [name] is taking her medications as prescribed and has a good understanding of what they are for. [name] has a BP monitor and she monitors her BP's at home.    [name] [facility] RN 09/20/2020 11:54 AM

## 2020-09-20 NOTE — Telephone Encounter (Signed)
-----   Message from Pricilla Riffle, MD sent at 09/08/2020  3:56 PM EDT ----- Pt has evidence of atheroscleroitic plaquing   Given that goal of LDL is less than 70  I would recomm increasing Crestor to 40 mg    F/U lipdis in 12 wks    Watch fatty food    limit processed foods and sweets.

## 2020-09-24 ENCOUNTER — Encounter (HOSPITAL_COMMUNITY)
Admission: RE | Admit: 2020-09-24 | Discharge: 2020-09-24 | Disposition: A | Discharge status: Home / Self Care | Payer: 59 | Source: Ambulatory Visit | Attending: Internal Medicine | Admitting: Internal Medicine

## 2020-09-24 ENCOUNTER — Other Ambulatory Visit: Payer: Self-pay

## 2020-09-24 ENCOUNTER — Other Ambulatory Visit: Payer: Self-pay | Admitting: Physician Assistant

## 2020-09-24 DIAGNOSIS — Z48812 Encounter for surgical aftercare following surgery on the circulatory system: Secondary | ICD-10-CM | POA: Diagnosis not present

## 2020-09-24 DIAGNOSIS — Z7901 Long term (current) use of anticoagulants: Secondary | ICD-10-CM

## 2020-09-24 DIAGNOSIS — Z952 Presence of prosthetic heart valve: Secondary | ICD-10-CM

## 2020-09-24 DIAGNOSIS — D509 Iron deficiency anemia, unspecified: Secondary | ICD-10-CM

## 2020-09-24 NOTE — Progress Notes (Signed)
Daily Session Note  Patient Details  Name: [name] MRN: [id number] Date of Birth: 1965/12/01 Referring Provider:   Flowsheet Row CARDIAC REHAB PHASE II ORIENTATION from 09/20/2020 in [place]  Referring Provider Dr. [name], MD       Encounter Date: 09/24/2020  Check In:  Session Check In - 09/24/20 1518       Check-In   Supervising physician immediately available to respond to emergencies [facility] immediately available    Physician(s) Dr. Mal Misty    Location [facility]    Staff Present Barnet Pall, RN, BSN;Jetta Walker BS, ACSM EP-C, Exercise Physiologist;Olinty Celesta Aver, MS, ACSM CEP, Exercise Physiologist;David Lilyan Punt, MS, ACSM-CEP, CCRP, Exercise Physiologist;Jessica Hassell Done, MS, ACSM-CEP, Exercise Physiologist    Virtual Visit No    Medication changes reported     No    Fall or balance concerns reported    No    Tobacco Cessation No Change    Current number of cigarettes/nicotine per day     0    Warm-up and Cool-down Performed on first and last piece of equipment    Resistance Training Performed Yes    VAD Patient? No    PAD/SET Patient? No      Pain Assessment   Currently in Pain? No/denies    Pain Score 0-No pain    Multiple Pain Sites No             Capillary Blood Glucose: No results found for this or any previous visit (from the past 24 hour(s)).   Exercise Prescription Changes - 09/24/20 1624       Response to Exercise   Blood Pressure (Admit) 122/82    Blood Pressure (Exercise) 149/80    Blood Pressure (Exit) 135/86    Heart Rate (Admit) 87 bpm    Heart Rate (Exercise) 84 bpm    Heart Rate (Exit) 70 bpm    Oxygen Saturation (Exercise) 94 %    Rating of Perceived Exertion (Exercise) 11    Perceived Dyspnea (Exercise) 0    Symptoms 0    Comments Pt first day of exercise    Duration Progress to 30 minutes of  aerobic without signs/symptoms of physical distress    Intensity  THRR unchanged      Progression   Progression Continue to progress workloads to maintain intensity without signs/symptoms of physical distress.    Average METs 1.9      Resistance Training   Training Prescription Yes    Weight 3    Reps 10-15      T5 Nustep   Level 1    SPM 70    Minutes 20    METs 1.9             Social History   Tobacco Use  Smoking Status Former   Packs/day: 0.50   Years: 10.00   Pack years: 5.00   Types: Cigarettes   Quit date: 05/04/2009   Years since quitting: 11.4  Smokeless Tobacco Never  Tobacco Comments   Socially x10 years (1 pack/month)    Goals Met:  Exercise tolerated well No report of cardiac concerns or symptoms Strength training completed today  Goals Unmet:  Not Applicable  Comments: Pt started cardiac rehab today.  Pt tolerated light exercise without difficulty for her fitness  level as [name] is somewhat deconditioned. VSS, telemetry-Sinus Rhythm, asymptomatic.  Medication list reconciled. Pt denies barriers to medicaiton compliance.  PSYCHOSOCIAL ASSESSMENT:  PHQ-0. Pt  exhibits positive coping skills, hopeful outlook with supportive family. Will review quality of life questionnaire in the upcoming weeks.    Pt enjoys painting and spending time with her grand kids.   Pt oriented to exercise equipment and routine.    Understanding verbalized. Barnet Pall, RN,BSN 09/24/2020 4:57 PM    Dr. Fransico Him is Medical Director for Cardiac Rehab at [facility].

## 2020-09-25 ENCOUNTER — Inpatient Hospital Stay: Payer: 59

## 2020-09-25 ENCOUNTER — Inpatient Hospital Stay: Payer: 59 | Admitting: Hematology and Oncology

## 2020-09-26 ENCOUNTER — Other Ambulatory Visit: Payer: Self-pay

## 2020-09-26 ENCOUNTER — Encounter (HOSPITAL_COMMUNITY)
Admission: RE | Admit: 2020-09-26 | Discharge: 2020-09-26 | Disposition: A | Discharge status: Home / Self Care | Payer: 59 | Source: Ambulatory Visit | Attending: Internal Medicine | Admitting: Internal Medicine

## 2020-09-26 DIAGNOSIS — Z952 Presence of prosthetic heart valve: Secondary | ICD-10-CM

## 2020-09-26 DIAGNOSIS — Z48812 Encounter for surgical aftercare following surgery on the circulatory system: Secondary | ICD-10-CM | POA: Diagnosis not present

## 2020-09-26 NOTE — Progress Notes (Signed)
QUALITY OF LIFE SCORE REVIEW  [name] completed Quality of Life survey as a participant in Cardiac Rehab.  Scores 21.0 or below are considered low.  Pt score very low in several areas Overall 18.7, Health and Function 18.17, socioeconomic 18.86, physiological and spiritual 20.79, family 15.60. Patient quality of life slightly altered by physical constraints which limits ability to perform as prior to recent cardiac illness.[name] reports being slightly dissatisfied with her health due to her weight.[name] [name] emotional support and reassurance.  Will continue to monitor and intervene as necessary. [name] hopes that participating in phase 2 cardiac rehab will her increase her endurance. [name] denies being depressed currently and reports already having a little more energy since starting cardiac rehab this week.Gladstone Lighter, RN,BSN 09/26/2020 4:37 PM

## 2020-09-27 ENCOUNTER — Inpatient Hospital Stay: Payer: 59 | Attending: Hematology and Oncology

## 2020-09-27 ENCOUNTER — Encounter: Payer: Self-pay | Admitting: Hematology and Oncology

## 2020-09-27 ENCOUNTER — Inpatient Hospital Stay (HOSPITAL_BASED_OUTPATIENT_CLINIC_OR_DEPARTMENT_OTHER): Payer: 59 | Admitting: Hematology and Oncology

## 2020-09-27 DIAGNOSIS — I350 Nonrheumatic aortic (valve) stenosis: Secondary | ICD-10-CM | POA: Diagnosis not present

## 2020-09-27 DIAGNOSIS — E785 Hyperlipidemia, unspecified: Secondary | ICD-10-CM | POA: Insufficient documentation

## 2020-09-27 DIAGNOSIS — Z87891 Personal history of nicotine dependence: Secondary | ICD-10-CM | POA: Diagnosis not present

## 2020-09-27 DIAGNOSIS — Z79899 Other long term (current) drug therapy: Secondary | ICD-10-CM | POA: Insufficient documentation

## 2020-09-27 DIAGNOSIS — D6851 Activated protein C resistance: Secondary | ICD-10-CM

## 2020-09-27 DIAGNOSIS — I10 Essential (primary) hypertension: Secondary | ICD-10-CM | POA: Diagnosis not present

## 2020-09-27 DIAGNOSIS — Z7982 Long term (current) use of aspirin: Secondary | ICD-10-CM | POA: Diagnosis not present

## 2020-09-27 DIAGNOSIS — Z86718 Personal history of other venous thrombosis and embolism: Secondary | ICD-10-CM | POA: Diagnosis not present

## 2020-09-27 DIAGNOSIS — D509 Iron deficiency anemia, unspecified: Secondary | ICD-10-CM

## 2020-09-27 DIAGNOSIS — Z7901 Long term (current) use of anticoagulants: Secondary | ICD-10-CM | POA: Insufficient documentation

## 2020-09-27 DIAGNOSIS — Z86711 Personal history of pulmonary embolism: Secondary | ICD-10-CM | POA: Diagnosis not present

## 2020-09-27 DIAGNOSIS — G4730 Sleep apnea, unspecified: Secondary | ICD-10-CM | POA: Insufficient documentation

## 2020-09-27 DIAGNOSIS — R5383 Other fatigue: Secondary | ICD-10-CM | POA: Insufficient documentation

## 2020-09-27 DIAGNOSIS — K219 Gastro-esophageal reflux disease without esophagitis: Secondary | ICD-10-CM | POA: Diagnosis not present

## 2020-09-27 LAB — CBC WITH DIFFERENTIAL (CANCER CENTER ONLY)
Abs Immature Granulocytes: 0.01 10*3/uL (ref 0.00–0.07)
Basophils Absolute: 0 10*3/uL (ref 0.0–0.1)
Basophils Relative: 1 %
Eosinophils Absolute: 0.2 10*3/uL (ref 0.0–0.5)
Eosinophils Relative: 3 %
HCT: 40 % (ref 36.0–46.0)
Hemoglobin: 12.1 g/dL (ref 12.0–15.0)
Immature Granulocytes: 0 %
Lymphocytes Relative: 15 %
Lymphs Abs: 1.1 10*3/uL (ref 0.7–4.0)
MCH: 25.1 pg — ABNORMAL LOW (ref 26.0–34.0)
MCHC: 30.3 g/dL (ref 30.0–36.0)
MCV: 82.8 fL (ref 80.0–100.0)
Monocytes Absolute: 0.5 10*3/uL (ref 0.1–1.0)
Monocytes Relative: 7 %
Neutro Abs: 5 10*3/uL (ref 1.7–7.7)
Neutrophils Relative %: 74 %
Platelet Count: 173 10*3/uL (ref 150–400)
RBC: 4.83 MIL/uL (ref 3.87–5.11)
RDW: 18.6 % — ABNORMAL HIGH (ref 11.5–15.5)
WBC Count: 6.8 10*3/uL (ref 4.0–10.5)
nRBC: 0 % (ref 0.0–0.2)

## 2020-09-27 LAB — IRON AND TIBC
Iron: 32 ug/dL — ABNORMAL LOW (ref 41–142)
Saturation Ratios: 10 % — ABNORMAL LOW (ref 21–57)
TIBC: 315 ug/dL (ref 236–444)
UIBC: 283 ug/dL (ref 120–384)

## 2020-09-27 LAB — FERRITIN: Ferritin: 107 ng/mL (ref 11–307)

## 2020-09-27 NOTE — Progress Notes (Signed)
[place] CONSULT NOTE  Patient Care Team: Tower, [name], MD as PCP - General [name], MD as PCP - Cardiology (Cardiology) [name], MD (Pulmonary Disease) [name], MD as Consulting Physician (Oncology) [name], MD as Consulting Physician (Nephrology) [name], MD as Consulting Physician (Rheumatology)  CHIEF COMPLAINTS/PURPOSE OF CONSULTATION:  Iron def anemia, follow up after iron infusion  ASSESSMENT & PLAN:   IDA (iron deficiency anemia) This is a very pleasant 55 year old female patient with past medical history significant for iron deficiency anemia status post iron infusion who is here for follow-up lab work and additional recommendations.  She tolerated iron infusions very well, her fatigue and ice craving has significantly improved. Repeat labs today showed improvement in hemoglobin and iron levels.  We will repeat labs in 6 months and discuss any additional recommendations.  Homozygous Factor V Leiden mutation [facility]) She continues on Coumadin for anticoagulation.  She was advised to continue this indefinitely at this time.  We also once again discussed about talking with her family members especially if she has daughters who might be considering birth control. She expressed understanding of the recommendations.  No orders of the defined types were placed in this encounter.    HISTORY OF PRESENTING ILLNESS:   [name] 55 y.o. female is here because of IDA.  This is a very pleasant 55 yr old female patient with iron deficiency anemia, s.p last iron infusion about 3 yrs ago, multiple DVT/PE and homozygous factor V leiden mutation, aortic stenosis status post aortic valve replacement, SLE, obesity referred to hematology for evaluation of anemia as well as given her history of homozygous factor V Leiden mutation.    She is here for follow-up.  She has been doing quite well since her iron infusion.  Her fatigue  is better and no more ice craving.  She tolerated iron infusion well.  Since last visit she denies any new health complaints.  She continues to follow-up with cardiology and is currently undergoing cardiology rehab for the next 8 weeks.  She is on Coumadin given multiple DVTs and PEs and she is tolerating this very well. Rest of the pertinent 10 point ROS reviewed and negative.  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    Loculated-chronic on Left-VanTright; s/p VATS 5/10   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   GERD (gastroesophageal reflux disease)    on prilosec r/t gastric sleeve surgery   HLD (hyperlipidemia)    Iron deficiency anemia    IV dextran Coladonato   Nephritis and nephropathy, not specified as acute or chronic, with unspecified pathological lesion in kidney    Nonspecific abnormal results of liver function study    Obesity, unspecified    Other specified acquired hypothyroidism    Panic disorder without agoraphobia    Personal history of venous thrombosis and embolism 2002   during pregnancy; ?factor 5 leiden (sees heme)   Pleural effusion 2005   c/w lupus initial w/u; recurrent Right as pred tapered off July 2011   Sleep apnea    Systemic lupus erythematosus ([place])    renal GN, hx of pericardial eff in late  90's   Unspecified essential hypertension     SURGICAL HISTORY: Past Surgical History:  Procedure Laterality Date   AORTIC VALVE REPLACEMENT N/A 04/06/2020   Procedure: AORTIC VALVE REPLACEMENT (AVR) USING INSPIRIS 21 MM AORTIC VALVE;  Surgeon: [name], MD;  Location: [facility];  Service: Open Heart Surgery;  Laterality: N/A;   BIOPSY  04/12/2019   Procedure: BIOPSY;  Surgeon: Thornton Park, MD;  Location: [facility];  Service: Gastroenterology;;   CARDIAC CATHETERIZATION     ESOPHAGEAL MANOMETRY N/A 07/15/2019   Procedure: ESOPHAGEAL MANOMETRY (EM);  Surgeon: Mauri Pole, MD;  Location: [facility];  Service: Endoscopy;  Laterality: N/A;   ESOPHAGOGASTRODUODENOSCOPY (EGD) WITH PROPOFOL N/A 04/12/2019   Procedure: ESOPHAGOGASTRODUODENOSCOPY (EGD) WITH PROPOFOL;  Surgeon: Thornton Park, MD;  Location: [facility];  Service: Gastroenterology;  Laterality: N/A;   gastric sleeve  09/04/2010   bariatric surgery Dr Evorn Gong    LAPAROSCOPIC TUBAL LIGATION  12/09/2010   Procedure: LAPAROSCOPIC TUBAL LIGATION;  Surgeon: Darlyn Chamber;  Location: [facility];  Service: Gynecology;  Laterality: Bilateral;  Attempted see Nursing note   pleurocentesis  10/04/2004   Pleuryx catheter placement  10/04/2009   VanTright----removed 9/11   RENAL BIOPSY  09/24/2012   RIGHT/LEFT HEART CATH AND CORONARY ANGIOGRAPHY N/A 01/19/2020   Procedure: RIGHT/LEFT HEART CATH AND CORONARY ANGIOGRAPHY;  Surgeon: Lorretta Harp, MD;  Location: [facility];  Service: Cardiovascular;  Laterality: N/A;   svd     x 3   TEE WITHOUT CARDIOVERSION N/A 04/06/2020   Procedure: TRANSESOPHAGEAL ECHOCARDIOGRAM (TEE);  Surgeon: [name], MD;  Location: [facility];  Service: Open Heart Surgery;  Laterality: N/A;   THORACENTESIS  02/04/2008   with penumonia   WISDOM TOOTH EXTRACTION      SOCIAL HISTORY: Social History   Socioeconomic History   Marital status: Divorced    Spouse name: Not on file   Number of children: 3   Years of education: 12   Highest education level: Some college, no degree  Occupational History   Occupation: Clinical research associate at [employer]   Smoking status: Former    Packs/day: 0.50    Years: 10.00    Pack years: 5.00    Types: Cigarettes    Quit date: 05/04/2009    Years since quitting: 11.4   Smokeless tobacco: Never   Tobacco comments:    Socially x10 years (1 pack/month)   Vaping Use   Vaping Use: Never used  Substance and Sexual Activity   Alcohol use: Not Currently    Alcohol/week: 0.0 standard drinks    Comment: rare   Drug use: No   Sexual activity: Not on file  Other Topics Concern   Not on file  Social History Narrative   Not on file   Social Determinants of Health   Financial Resource Strain: Not on file  Food Insecurity: Not on file  Transportation Needs: Not on file  Physical Activity: Not on file  Stress: Not on file  Social Connections: Not on file  Intimate Partner Violence: Not on file    FAMILY HISTORY: Family History  Problem Relation Age of Onset   Lupus Sister    Hypertension Father    Hyperlipidemia Father    Leukemia Sister    Diabetes Other        GM   Colon cancer Neg Hx    Esophageal cancer Neg Hx    Pancreatic cancer Neg Hx  ALLERGIES:  is allergic to lisinopril, lovenox [enoxaparin sodium], moxifloxacin, and lovenox [enoxaparin].  MEDICATIONS:  Current Outpatient Medications  Medication Sig Dispense Refill   albuterol (VENTOLIN HFA) 108 (90 Base) MCG/ACT inhaler Inhale 1-2 puffs into the lungs every 6 (six) hours as needed for wheezing or shortness of breath. 18 g 0   amoxicillin (AMOXIL) 500 MG tablet Take 4 tabs (2000 mg) 30-60 min before dental procedures 4 tablet 1   aspirin EC 81 MG EC tablet Take 1 tablet (81 mg total) by mouth daily. Swallow whole. 30 tablet 11   bismuth subsalicylate (PEPTO BISMOL) 262 MG/15ML suspension Take 30 mLs by mouth every 6 (six) hours as needed for indigestion or diarrhea or loose stools.     calcium carbonate (TUMS - DOSED IN MG ELEMENTAL CALCIUM) 500 MG chewable tablet Chew 1-2 tablets by mouth 3 (three) times daily as needed for indigestion or heartburn.     cyanocobalamin (,VITAMIN B-12,) 1000 MCG/ML injection Inject 1,000 mcg into the muscle every 30 (thirty) days.      ezetimibe (ZETIA) 10 MG tablet Take 1 tablet (10 mg total) by mouth daily. 90 tablet 3    hydroxychloroquine (PLAQUENIL) 200 MG tablet Take 200 mg by mouth 2 (two) times daily.     levothyroxine (SYNTHROID) 200 MCG tablet Take 200 mcg by mouth daily before breakfast.     metoprolol tartrate (LOPRESSOR) 50 MG tablet Take 1 tablet (50 mg total) by mouth 2 (two) times daily. 180 tablet 3   Multiple Vitamins-Minerals (BARIATRIC MULTIVITAMINS/IRON) CAPS Take 1 tablet by mouth daily.     omeprazole (PRILOSEC) 20 MG capsule TAKE 1 CAPSULE (20 MG TOTAL) BY MOUTH 2 (TWO) TIMES DAILY BEFORE A MEAL. 180 capsule 1   PARoxetine (PAXIL) 40 MG tablet TAKE 1 TABLET BY MOUTH EVERY DAY IN THE MORNING 90 tablet 3   promethazine-dextromethorphan (PROMETHAZINE-DM) 6.25-15 MG/5ML syrup Take 5 mLs by mouth 4 (four) times daily as needed for cough. (Patient not taking: Reported on 09/19/2020) 100 mL 0   rosuvastatin (CRESTOR) 40 MG tablet Take 1 tablet (40 mg total) by mouth daily. 90 tablet 3   warfarin (COUMADIN) 7.5 MG tablet Daily or as directed (Patient taking differently: Take 7.5-11.25 mg by mouth See admin instructions. Take 1 and 1/2 tablets on Monday, Wednesday and Friday then take 1 tablet all the other days) 145 tablet 0   No current facility-administered medications for this visit.    PHYSICAL EXAMINATION:  ECOG PERFORMANCE STATUS: 0 - Asymptomatic  Vitals:   09/27/20 1428  BP: (!) 142/83  Pulse: 78  Resp: 18  Temp: 98.4 F (36.9 C)  SpO2: 100%   Filed Weights   09/27/20 1428  Weight: (!) 314 lb 12.8 oz (142.8 kg)   GENERAL:alert, no distress and comfortable SKIN: skin color, texture, turgor are normal, no rashes or significant lesions EYES: normal, conjunctiva are pink and non-injected, sclera clear OROPHARYNX:no exudate, no erythema and lips, buccal mucosa, and tongue normal  NECK: supple, thyroid normal size, non-tender, without nodularity LYMPH:  no palpable lymphadenopathy in the cervical, axillary  LUNGS: clear to auscultation and percussion with normal breathing  effort HEART: ES murmur,  ABDOMEN:abdomen soft, non-tender and normal bowel sounds Musculoskeletal:no cyanosis of digits and no clubbing.  Chronic venous stasis changes bilateral lower extremities. PSYCH: alert & oriented x 3 with fluent speech NEURO: no focal motor/sensory deficits   LABORATORY DATA:  I have reviewed the data as listed Lab Results  Component Value Date  WBC 6.8 09/27/2020   HGB 12.1 09/27/2020   HCT 40.0 09/27/2020   MCV 82.8 09/27/2020   PLT 173 09/27/2020     Chemistry      Component Value Date/Time   NA 138 06/18/2020 1434   NA 140 12/06/2012 1444   K 5.0 06/18/2020 1434   K 5.1 12/06/2012 1444   CL 102 06/18/2020 1434   CO2 29 06/18/2020 1434   CO2 23 12/06/2012 1444   BUN 16 06/18/2020 1434   BUN 21.4 12/06/2012 1444   CREATININE 1.05 06/18/2020 1434   CREATININE 1.1 12/06/2012 1444      Component Value Date/Time   CALCIUM 9.0 06/18/2020 1434   CALCIUM 8.7 12/06/2012 1444   ALKPHOS 77 04/05/2020 0856   ALKPHOS 57 12/06/2012 1444   AST 52 (H) 04/05/2020 0856   AST 24 12/06/2012 1444   ALT 23 04/05/2020 0856   ALT 13 12/06/2012 1444   BILITOT 0.5 04/05/2020 0856   BILITOT <0.20 12/06/2012 1444     Have reviewed his CBC from today.  Hemoglobin is normal at 12.1 g/dL.  Ferritin improved from 17 207.  RADIOGRAPHIC STUDIES: I have personally reviewed the radiological images as listed and agreed with the findings in the report. No results found.  All questions were answered. The patient knows to call the clinic with any problems, questions or concerns.     Benay Pike, MD 09/27/2020 5:07 PM

## 2020-09-27 NOTE — Assessment & Plan Note (Signed)
This is a very pleasant 55 year old female patient with past medical history significant for iron deficiency anemia status post iron infusion who is here for follow-up lab work and additional recommendations.  She tolerated iron infusions very well, her fatigue and ice craving has significantly improved. Repeat labs today showed improvement in hemoglobin and iron levels.  We will repeat labs in 6 months and discuss any additional recommendations.

## 2020-09-27 NOTE — Assessment & Plan Note (Signed)
She continues on Coumadin for anticoagulation.  She was advised to continue this indefinitely at this time.  We also once again discussed about talking with her family members especially if she has daughters who might be considering birth control. She expressed understanding of the recommendations.

## 2020-09-28 ENCOUNTER — Other Ambulatory Visit: Payer: Self-pay

## 2020-09-28 ENCOUNTER — Encounter (HOSPITAL_COMMUNITY)
Admission: RE | Admit: 2020-09-28 | Discharge: 2020-09-28 | Disposition: A | Discharge status: Home / Self Care | Payer: 59 | Source: Ambulatory Visit | Attending: Internal Medicine | Admitting: Internal Medicine

## 2020-09-28 DIAGNOSIS — Z48812 Encounter for surgical aftercare following surgery on the circulatory system: Secondary | ICD-10-CM | POA: Diagnosis not present

## 2020-09-28 DIAGNOSIS — Z952 Presence of prosthetic heart valve: Secondary | ICD-10-CM

## 2020-09-28 NOTE — Progress Notes (Addendum)
[name] 55 y.o. 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    Loculated-chronic on Left-VanTright; s/p VATS 5/10   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   GERD (gastroesophageal reflux disease)    on prilosec r/t gastric sleeve surgery   HLD (hyperlipidemia)    Iron deficiency anemia    IV dextran Coladonato   Nephritis and nephropathy, not specified as acute or chronic, with unspecified pathological lesion in kidney    Nonspecific abnormal results of liver function study    Obesity, unspecified    Other specified acquired hypothyroidism    Panic disorder without agoraphobia    Personal history of venous thrombosis and embolism 2002   during pregnancy; ?factor 5 leiden (sees heme)   Pleural effusion 2005   c/w lupus initial w/u; recurrent Right as pred tapered off July 2011   Sleep apnea    Systemic lupus erythematosus ([place])    renal GN, hx of pericardial eff in late 90's   Unspecified essential hypertension      Medications reviewed.   Current Outpatient Medications:    albuterol (VENTOLIN HFA) 108 (90 Base) MCG/ACT inhaler, Inhale 1-2 puffs into the lungs every 6 (six) hours as needed for wheezing or shortness of breath., Disp: 18 g, Rfl: 0   amoxicillin (AMOXIL) 500 MG tablet, Take 4 tabs (2000 mg) 30-60 min before dental procedures, Disp: 4 tablet, Rfl: 1   aspirin EC 81 MG EC tablet, Take 1 tablet (81 mg total) by mouth daily. Swallow whole., Disp: 30 tablet, Rfl: 11   bismuth subsalicylate (PEPTO BISMOL) 262 MG/15ML suspension, Take 30 mLs by mouth every 6 (six) hours as needed for indigestion or diarrhea or loose stools., Disp: , Rfl:    calcium carbonate (TUMS - DOSED IN MG ELEMENTAL CALCIUM) 500 MG  chewable tablet, Chew 1-2 tablets by mouth 3 (three) times daily as needed for indigestion or heartburn., Disp: , Rfl:    cyanocobalamin (,VITAMIN B-12,) 1000 MCG/ML injection, Inject 1,000 mcg into the muscle every 30 (thirty) days. , Disp: , Rfl:    ezetimibe (ZETIA) 10 MG tablet, Take 1 tablet (10 mg total) by mouth daily., Disp: 90 tablet, Rfl: 3   hydroxychloroquine (PLAQUENIL) 200 MG tablet, Take 200 mg by mouth 2 (two) times daily., Disp: , Rfl:    levothyroxine (SYNTHROID) 200 MCG tablet, Take 200 mcg by mouth daily before breakfast., Disp: , Rfl:    metoprolol tartrate (LOPRESSOR) 50 MG tablet, Take 1 tablet (50 mg total) by mouth 2 (two) times daily., Disp: 180 tablet, Rfl: 3   Multiple Vitamins-Minerals (BARIATRIC MULTIVITAMINS/IRON) CAPS, Take 1 tablet by mouth daily., Disp: , Rfl:    omeprazole (PRILOSEC) 20 MG capsule, TAKE 1 CAPSULE (20 MG TOTAL) BY MOUTH 2 (TWO) TIMES DAILY BEFORE A MEAL., Disp: 180 capsule, Rfl: 1   PARoxetine (PAXIL) 40 MG tablet, TAKE 1 TABLET BY MOUTH EVERY DAY IN THE MORNING, Disp: 90 tablet, Rfl: 3   promethazine-dextromethorphan (PROMETHAZINE-DM) 6.25-15 MG/5ML syrup, Take 5 mLs by mouth 4 (four) times daily as needed for cough. (Patient not taking: Reported on 09/19/2020), Disp: 100 mL, Rfl: 0   rosuvastatin (CRESTOR) 40 MG  tablet, Take 1 tablet (40 mg total) by mouth daily., Disp: 90 tablet, Rfl: 3   warfarin (COUMADIN) 7.5 MG tablet, Daily or as directed (Patient taking differently: Take 7.5-11.25 mg by mouth See admin instructions. Take 1 and 1/2 tablets on Monday, Wednesday and Friday then take 1 tablet all the other days), Disp: 145 tablet, Rfl: 0   Ht Readings from Last 1 Encounters:  09/27/20 5' 6.25" (1.683 m)     Wt Readings from Last 3 Encounters:  09/27/20 (!) 314 lb 12.8 oz (142.8 kg)  09/20/20 (!) 314 lb 6 oz (142.6 kg)  07/06/20 (!) 310 lb (140.6 kg)     There is no height or weight on file to calculate BMI.   Social History   Tobacco  Use  Smoking Status Former   Packs/day: 0.50   Years: 10.00   Pack years: 5.00   Types: Cigarettes   Quit date: 05/04/2009   Years since quitting: 11.4  Smokeless Tobacco Never  Tobacco Comments   Socially x10 years (1 pack/month)     Lab Results  Component Value Date   CHOL 163 09/07/2020   Lab Results  Component Value Date   HDL 57 09/07/2020   Lab Results  Component Value Date   LDLCALC 91 09/07/2020   Lab Results  Component Value Date   TRIG 79 09/07/2020     Lab Results  Component Value Date   HGBA1C 5.7 (H) 04/05/2020     CBG (last 3)  No results for input(s): GLUCAP in the last 72 hours.   Nutrition Note  Spoke with pt. Nutrition Plan and Nutrition Survey goals reviewed with pt. Pt is following a Heart Healthy diet. Pt wants to lose wt. She had bariatric surgery in the past and lost 100 lbs following sx. She has maintained a 60 lb weight loss. Continues taking B12 supplement. She is well educated on strategies for weight loss. She has not been exercising recently due to difficulties with SOB prior to surgery. However, she has a stationary bike and enjoys walking and plans to start exercising again. She has already started attending cardiac rehab exercise sessions.   Reviewed sodium intake -  pt does use the salt shaker and she also reads labels. Minimal canned or processed pantry foods.  She has been eating more fast food recently due to a daughter/grandchild living with her. She is aware these are high sodium foods. She and her daughter split the cooking. She follows plate method at home. She does not always get a 1/2 plate of veggies. She is struggling with a esophageal hernia which causes more reflux. She tries not to overeat.   Reviewed coumadin/vitamin k interaction. She maintains a consistent intake of vitamin k rich roods.  Pt A1c in prediabetes range. Reviewed diabetes prevention. Diet recall: Breakfast: coffee with sugar free creamer, collagen powder.  Activia yogurt with small scoop granola. Snack: grapes, puffed corn Lunch: chicken breast, broc, couscous (1/4 plate of each) Snack: apple and peanut butter Dinner: chicken, potato, green beans  Pt expressed understanding of the information reviewed.    Nutrition Diagnosis Obese   III = 40+ related to sedentary lifestyle and lack of exercise as evidenced by a BMI 50.43 kg/m2 and pt desire to lose weight   Nutrition Intervention Pt's individual nutrition plan reviewed with pt. Benefits of adopting Heart Healthy diet discussed when Picture Your Plate reviewed.   Continue client-centered nutrition education by RD, as part of interdisciplinary care.  Goal(s)  Pt  to identify food quantities necessary to achieve weight loss of 6-24 lb at graduation from cardiac rehab.  Pt to build a healthy plate including vegetables, fruits, whole grains, and low-fat dairy products in a heart healthy meal plan. Pt to exercise at home  Plan:  Will provide client-centered nutrition education as part of interdisciplinary care Monitor and evaluate progress toward nutrition goal with team.   Michaele Offer, MS, RDN, LDN, CDCES

## 2020-10-01 ENCOUNTER — Other Ambulatory Visit: Payer: Self-pay

## 2020-10-01 ENCOUNTER — Encounter (HOSPITAL_COMMUNITY)
Admission: RE | Admit: 2020-10-01 | Discharge: 2020-10-01 | Disposition: A | Discharge status: Home / Self Care | Payer: 59 | Source: Ambulatory Visit | Attending: Internal Medicine | Admitting: Internal Medicine

## 2020-10-01 DIAGNOSIS — Z952 Presence of prosthetic heart valve: Secondary | ICD-10-CM

## 2020-10-01 DIAGNOSIS — Z48812 Encounter for surgical aftercare following surgery on the circulatory system: Secondary | ICD-10-CM | POA: Diagnosis not present

## 2020-10-03 ENCOUNTER — Telehealth: Payer: Self-pay | Admitting: Family Medicine

## 2020-10-03 ENCOUNTER — Other Ambulatory Visit: Payer: Self-pay

## 2020-10-03 ENCOUNTER — Encounter (HOSPITAL_COMMUNITY)
Admission: RE | Admit: 2020-10-03 | Discharge: 2020-10-03 | Disposition: A | Discharge status: Home / Self Care | Payer: 59 | Source: Ambulatory Visit | Attending: Internal Medicine | Admitting: Internal Medicine

## 2020-10-03 DIAGNOSIS — Z48812 Encounter for surgical aftercare following surgery on the circulatory system: Secondary | ICD-10-CM | POA: Diagnosis not present

## 2020-10-03 DIAGNOSIS — Z952 Presence of prosthetic heart valve: Secondary | ICD-10-CM

## 2020-10-03 DIAGNOSIS — Z7901 Long term (current) use of anticoagulants: Secondary | ICD-10-CM

## 2020-10-03 MED ORDER — WARFARIN SODIUM 7.5 MG PO TABS
ORAL_TABLET | ORAL | 0 refills | Status: DC
Start: 2020-10-03 — End: 2020-10-23

## 2020-10-03 NOTE — Telephone Encounter (Signed)
Contacted pt because she has not had a coumadin clinic apt since 07/03/20. She said time got away from her. Scheduled an apt for tomorrow. She said she also used to get a B12 inj from [place]. Advised if that is ok with PCP that can also be done at that apt. Advised script would be sent in. Verified dose she is taking now is same as in chart form 07/03/20 Sent msg to PCP regarding B12 inj due to last lab for B12 was done on 01/04/19.  Sent in script

## 2020-10-03 NOTE — Telephone Encounter (Signed)
Pt needs a refill on warfarin (COUMADIN) 7.5 MG tablet sent to [facility]

## 2020-10-03 NOTE — Telephone Encounter (Signed)
Will route to coumadin nurse for review  

## 2020-10-04 ENCOUNTER — Ambulatory Visit (INDEPENDENT_AMBULATORY_CARE_PROVIDER_SITE_OTHER): Payer: 59

## 2020-10-04 DIAGNOSIS — Z7901 Long term (current) use of anticoagulants: Secondary | ICD-10-CM | POA: Diagnosis not present

## 2020-10-04 DIAGNOSIS — E538 Deficiency of other specified B group vitamins: Secondary | ICD-10-CM | POA: Diagnosis not present

## 2020-10-04 LAB — POCT INR: INR: 2.3 (ref 2.0–3.0)

## 2020-10-04 MED ORDER — CYANOCOBALAMIN 1000 MCG/ML IJ SOLN
1000.0000 ug | Freq: Once | INTRAMUSCULAR | Status: AC
  Administered 2020-10-04: 1000 ug via INTRAMUSCULAR

## 2020-10-04 NOTE — Progress Notes (Signed)
Patient ID: [name], female   DOB: 11/17/1965, 55 y.o.   MRN: [id number] ° °Medical screening examination/treatment/procedure(s) were performed by non-physician practitioner and as supervising physician I was immediately available for consultation/collaboration.  I agree with above. Donell Sliwinski, MD ° °

## 2020-10-04 NOTE — Progress Notes (Signed)
Pt requested B12 at visit. Last B12 lab was 01/04/19. Per Dr. Milinda Antis, schedule pt a f/u apt with her for OV and labs at same visit and ok to give B12 at this visit. Per orders of Dr. Jonny Ruiz in Dr. Royden Purl absence, injection of B12 given in R deltoid by Sherrie George. Patient tolerated injection well.

## 2020-10-04 NOTE — Patient Instructions (Addendum)
Pre visit review using our clinic review tool, if applicable. No additional management support is needed unless otherwise documented below in the visit note.  Increase dose today to 18.75mg  and then continue to take 1 1/2 tablets daily except take 1 tablet on Mon Wed and Friday.  Re-check in 4 weeks.

## 2020-10-05 ENCOUNTER — Other Ambulatory Visit: Payer: Self-pay

## 2020-10-05 ENCOUNTER — Encounter (HOSPITAL_COMMUNITY)
Admission: RE | Admit: 2020-10-05 | Discharge: 2020-10-05 | Disposition: A | Discharge status: Home / Self Care | Payer: 59 | Source: Ambulatory Visit | Attending: Internal Medicine | Admitting: Internal Medicine

## 2020-10-05 DIAGNOSIS — Z952 Presence of prosthetic heart valve: Secondary | ICD-10-CM | POA: Diagnosis not present

## 2020-10-10 ENCOUNTER — Telehealth (HOSPITAL_COMMUNITY): Payer: Self-pay | Admitting: Family Medicine

## 2020-10-10 ENCOUNTER — Encounter (HOSPITAL_COMMUNITY): Payer: 59

## 2020-10-12 ENCOUNTER — Encounter (HOSPITAL_COMMUNITY)
Admission: RE | Admit: 2020-10-12 | Discharge: 2020-10-12 | Disposition: A | Discharge status: Home / Self Care | Payer: 59 | Source: Ambulatory Visit | Attending: Internal Medicine | Admitting: Internal Medicine

## 2020-10-12 ENCOUNTER — Other Ambulatory Visit: Payer: Self-pay

## 2020-10-12 DIAGNOSIS — Z952 Presence of prosthetic heart valve: Secondary | ICD-10-CM | POA: Diagnosis not present

## 2020-10-12 NOTE — Progress Notes (Signed)
CARDIAC REHAB PHASE 2   Reviewed home Ex Rx with patient today. Patient is tolerating exercise well. She will exercise on her own at home using her recumbent bike 3 additional days for 15-30 minutes per session. Encouraged warm up, cool down and stretches. Advised of temperature precautions and s/s to stop exercise and when to call MD vs 911. Reviewed THRR, RPE and NTG usage. Pt verbalized understanding, all questions were answered and pt was given a copy.   Harrie Jeans ACSM-EP 10/12/2020 4:39 PM

## 2020-10-15 ENCOUNTER — Encounter (HOSPITAL_COMMUNITY): Payer: 59

## 2020-10-15 ENCOUNTER — Telehealth (HOSPITAL_COMMUNITY): Payer: Self-pay | Admitting: Family Medicine

## 2020-10-16 NOTE — Progress Notes (Signed)
Cardiac Individual Treatment Plan  Patient Details  Name: ALAHIA WHICKER MRN: [id number] Date of Birth: 06/27/1965 Referring Provider:   Flowsheet Row CARDIAC REHAB PHASE II ORIENTATION from 09/20/2020 in [facility]  Referring Provider Dr. [name], MD       Initial Encounter Date:  [place] PHASE II ORIENTATION from 09/20/2020 in [facility]  Date 09/20/20       Visit Diagnosis: 04/06/20 S/P AVR (aortic valve replacement)  Patient's Home Medications on Admission:  Current Outpatient Medications:    albuterol (VENTOLIN HFA) 108 (90 Base) MCG/ACT inhaler, Inhale 1-2 puffs into the lungs every 6 (six) hours as needed for wheezing or shortness of breath., Disp: 18 g, Rfl: 0   amoxicillin (AMOXIL) 500 MG tablet, Take 4 tabs (2000 mg) 30-60 min before dental procedures, Disp: 4 tablet, Rfl: 1   aspirin EC 81 MG EC tablet, Take 1 tablet (81 mg total) by mouth daily. Swallow whole., Disp: 30 tablet, Rfl: 11   bismuth subsalicylate (PEPTO BISMOL) 262 MG/15ML suspension, Take 30 mLs by mouth every 6 (six) hours as needed for indigestion or diarrhea or loose stools., Disp: , Rfl:    calcium carbonate (TUMS - DOSED IN MG ELEMENTAL CALCIUM) 500 MG chewable tablet, Chew 1-2 tablets by mouth 3 (three) times daily as needed for indigestion or heartburn., Disp: , Rfl:    cyanocobalamin (,VITAMIN B-12,) 1000 MCG/ML injection, Inject 1,000 mcg into the muscle every 30 (thirty) days. , Disp: , Rfl:    ezetimibe (ZETIA) 10 MG tablet, Take 1 tablet (10 mg total) by mouth daily., Disp: 90 tablet, Rfl: 3   hydroxychloroquine (PLAQUENIL) 200 MG tablet, Take 200 mg by mouth 2 (two) times daily., Disp: , Rfl:    levothyroxine (SYNTHROID) 200 MCG tablet, Take 200 mcg by mouth daily before breakfast., Disp: , Rfl:    metoprolol tartrate (LOPRESSOR) 50 MG tablet, Take 1 tablet (50 mg total) by mouth 2 (two) times daily., Disp: 180  tablet, Rfl: 3   Multiple Vitamins-Minerals (BARIATRIC MULTIVITAMINS/IRON) CAPS, Take 1 tablet by mouth daily., Disp: , Rfl:    omeprazole (PRILOSEC) 20 MG capsule, TAKE 1 CAPSULE (20 MG TOTAL) BY MOUTH 2 (TWO) TIMES DAILY BEFORE A MEAL., Disp: 180 capsule, Rfl: 1   PARoxetine (PAXIL) 40 MG tablet, TAKE 1 TABLET BY MOUTH EVERY DAY IN THE MORNING, Disp: 90 tablet, Rfl: 3   promethazine-dextromethorphan (PROMETHAZINE-DM) 6.25-15 MG/5ML syrup, Take 5 mLs by mouth 4 (four) times daily as needed for cough. (Patient not taking: Reported on 09/19/2020), Disp: 100 mL, Rfl: 0   rosuvastatin (CRESTOR) 40 MG tablet, Take 1 tablet (40 mg total) by mouth daily., Disp: 90 tablet, Rfl: 3   warfarin (COUMADIN) 7.5 MG tablet, TAKE 1 1/2 TABLETS (11.25MG) DAILY BY MOUTH EXCEPT TAKE 1 TABLET (7.5MG) ON MONDAY, WEDNESDAY, AND FRIDAY OR AS DIRECTED BY ANTICOAGULATION CLINIC, Disp: 40 tablet, Rfl: 0  Past Medical History: 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    Loculated-chronic on Left-VanTright; s/p VATS 5/10   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   GERD (gastroesophageal reflux disease)    on prilosec r/t gastric sleeve surgery  HLD (hyperlipidemia)    Iron deficiency anemia    IV dextran [place]   Nephritis and nephropathy, not specified as acute or chronic, with unspecified pathological lesion in kidney    Nonspecific abnormal results of liver function study    Obesity, unspecified    Other specified acquired hypothyroidism    Panic disorder without agoraphobia    Personal history of venous thrombosis and embolism 2002   during pregnancy; ?factor 5 leiden (sees heme)   Pleural effusion 2005   c/w lupus initial w/u; recurrent Right as pred tapered off July 2011   Sleep apnea     Systemic lupus erythematosus ([place])    renal GN, hx of pericardial eff in late 90's   Unspecified essential hypertension     Tobacco Use: Social History   Tobacco Use  Smoking Status Former   Packs/day: 0.50   Years: 10.00   Pack years: 5.00   Types: Cigarettes   Quit date: 05/04/2009   Years since quitting: 11.4  Smokeless Tobacco Never  Tobacco Comments   Socially x10 years (1 pack/month)    Labs: Recent Review Flowsheet Data     Labs for ITP Cardiac and Pulmonary Rehab Latest Ref Rng & Units 04/06/2020 04/06/2020 04/06/2020 07/06/2020 09/07/2020   Cholestrol 100 - 199 mg/dL - - - 185 163   LDLCALC 0 - 99 mg/dL - - - 109(H) 91   LDLDIRECT mg/dL - - - - -   HDL >39 mg/dL - - - 57 57   Trlycerides 0 - 149 mg/dL - - - 105 79   Hemoglobin A1c 4.8 - 5.6 % - - - - -   PHART 7.350 - 7.450 7.284(L) 7.329(L) 7.285(L) - -   PCO2ART 32.0 - 48.0 mmHg 51.6(H) 51.0(H) 53.7(H) - -   HCO3 20.0 - 28.0 mmol/L 24.8 27.0 25.7 - -   TCO2 22 - 32 mmol/L _0 - -   ACIDBASEDEF 0.0 - 2.0 mmol/L 2.0 - 1.0 - -   O2SAT % 97.0 99.0 99.0 - -       Capillary Blood Glucose: Lab Results  Component Value Date   GLUCAP 104 (H) 04/09/2020   GLUCAP 99 04/09/2020   GLUCAP 88 04/09/2020   GLUCAP 140 (H) 04/08/2020   GLUCAP 108 (H) 04/08/2020     Exercise Target Goals: Exercise Program Goal: Individual exercise prescription set using results from initial 6 min walk test and THRR while considering  patient's activity barriers and safety.   Exercise Prescription Goal: Starting with aerobic activity 30 plus minutes a day, 3 days per week for initial exercise prescription. Provide home exercise prescription and guidelines that participant acknowledges understanding prior to discharge.  Activity Barriers & Risk Stratification:  Activity Barriers & Cardiac Risk Stratification - 09/20/20 1058       Activity Barriers & Cardiac Risk Stratification   Activity Barriers Balance  Concerns;Deconditioning;History of Falls;Muscular Weakness    Cardiac Risk Stratification High             6 Minute Walk:  6 Minute Walk     Row Name 09/20/20 1056         6 Minute Walk   Phase Initial     Distance 857 feet     Walk Time 6 minutes     # of Rest Breaks 0     MPH 1.62     METS 1.72     RPE 13     Perceived Dyspnea  0  VO2 Peak 6.02     Symptoms Yes (comment)     Comments Left leg muscle fatigue     Resting HR 71 bpm     Resting BP 108/74     Resting Oxygen Saturation  95 %     Exercise Oxygen Saturation  during 6 min walk 90 %     Max Ex. HR 96 bpm     Max Ex. BP 140/78     2 Minute Post BP 112/68              Oxygen Initial Assessment:   Oxygen Re-Evaluation:   Oxygen Discharge (Final Oxygen Re-Evaluation):   Initial Exercise Prescription:  Initial Exercise Prescription - 09/20/20 1100       Date of Initial Exercise RX and Referring Provider   Date 09/20/20    Referring Provider Dr. [name], MD    Expected Discharge Date 11/16/20      T5 Nustep   Level 1    SPM 70    Minutes 20    METs 1.4      Intensity   THRR 40-80% of Max Heartrate 66-133    Ratings of Perceived Exertion 11-13    Perceived Dyspnea 0-4      Progression   Progression Continue progressive overload as per policy without signs/symptoms or physical distress.      Resistance Training   Training Prescription Yes    Weight 3    Reps 10-15             Perform Capillary Blood Glucose checks as needed.  Exercise Prescription Changes:   Exercise Prescription Changes     Row Name 09/24/20 1624 10/12/20 1600           Response to Exercise   Blood Pressure (Admit) 122/82 102/60      Blood Pressure (Exercise) 149/80 121/74      Blood Pressure (Exit) 135/86 112/68      Heart Rate (Admit) 87 bpm 76 bpm      Heart Rate (Exercise) 84 bpm 97 bpm      Heart Rate (Exit) 70 bpm 60 bpm      Oxygen Saturation (Exercise) 94 % --      Rating of  Perceived Exertion (Exercise) 11 12      Perceived Dyspnea (Exercise) 0 0      Symptoms 0 0      Comments Pt first day of exercise Reviewed MET's, goals and home Ex Rx      Duration Progress to 30 minutes of  aerobic without signs/symptoms of physical distress Progress to 30 minutes of  aerobic without signs/symptoms of physical distress      Intensity THRR unchanged THRR unchanged             Progression   Progression Continue to progress workloads to maintain intensity without signs/symptoms of physical distress. Continue to progress workloads to maintain intensity without signs/symptoms of physical distress.      Average METs 1.9 2             Resistance Training   Training Prescription Yes Yes      Weight 3 3      Reps 10-15 10-15             T5 Nustep   Level 1 1      SPM 70 70      Minutes 20 20      METs 1.9 2  Home Exercise Plan   Plans to continue exercise at -- Home (comment)      Frequency -- Add 3 additional days to program exercise sessions.      Initial Home Exercises Provided -- 10/12/20               Exercise Comments:   Exercise Comments     Row Name 09/24/20 1629 10/12/20 1638         Exercise Comments Pt first day in the CRP2 program. Pt tolerated exercise well without sign or symptom Reviewed MET's goals and home Ex Rx. Pt is tolerating exercise well with an average MET level of 2.0. Pt states that she can tell the program is helping her gain more mobility. Her ADL's have become easier since starting and she is able to do more things she loves like holding and rocking her grandchild for longer periods of time. Another goals was to create a home exercise program, which we did today. She will exercise on her recumbant bike 3 additional days for 15-30 minutes per session.               Exercise Goals and Review:   Exercise Goals     Row Name 09/20/20 1103             Exercise Goals   Increase Physical Activity Yes        Intervention Provide advice, education, support and counseling about physical activity/exercise needs.;Develop an individualized exercise prescription for aerobic and resistive training based on initial evaluation findings, risk stratification, comorbidities and participant's personal goals.       Expected Outcomes Short Term: Attend rehab on a regular basis to increase amount of physical activity.;Long Term: Add in home exercise to make exercise part of routine and to increase amount of physical activity.;Long Term: Exercising regularly at least 3-5 days a week.       Increase Strength and Stamina Yes       Intervention Provide advice, education, support and counseling about physical activity/exercise needs.;Develop an individualized exercise prescription for aerobic and resistive training based on initial evaluation findings, risk stratification, comorbidities and participant's personal goals.       Expected Outcomes Short Term: Increase workloads from initial exercise prescription for resistance, speed, and METs.;Short Term: Perform resistance training exercises routinely during rehab and add in resistance training at home;Long Term: Improve cardiorespiratory fitness, muscular endurance and strength as measured by increased METs and functional capacity (6MWT)       Able to understand and use rate of perceived exertion (RPE) scale Yes       Intervention Provide education and explanation on how to use RPE scale       Expected Outcomes Short Term: Able to use RPE daily in rehab to express subjective intensity level;Long Term:  Able to use RPE to guide intensity level when exercising independently       Knowledge and understanding of Target Heart Rate Range (THRR) Yes       Intervention Provide education and explanation of THRR including how the numbers were predicted and where they are located for reference       Expected Outcomes Short Term: Able to state/look up THRR;Long Term: Able to use THRR to govern  intensity when exercising independently;Short Term: Able to use daily as guideline for intensity in rehab       Understanding of Exercise Prescription Yes       Intervention Provide education, explanation, and written materials on patient's individual  exercise prescription       Expected Outcomes Short Term: Able to explain program exercise prescription;Long Term: Able to explain home exercise prescription to exercise independently                Exercise Goals Re-Evaluation :  Exercise Goals Re-Evaluation     Row Name 09/24/20 1627 10/12/20 1633           Exercise Goal Re-Evaluation   Exercise Goals Review Increase Physical Activity;Able to understand and use rate of perceived exertion (RPE) scale;Increase Strength and Stamina;Knowledge and understanding of Target Heart Rate Range (THRR);Understanding of Exercise Prescription Increase Physical Activity;Able to understand and use rate of perceived exertion (RPE) scale;Increase Strength and Stamina;Knowledge and understanding of Target Heart Rate Range (THRR);Understanding of Exercise Prescription      Comments Pt first day in the CRP2 program. Pt tolerated exercise well. Pt understands RPE, THRR and exercise Rx. Reviewed MET's goals and home Ex Rx. Pt is tolerating exercise well with an average MET level of 2.0. Pt states that she can tell the program is helping her gain more mobility. Her ADL's have become easier since starting and she is able to do more things she loves like holding and rocking her grandchild for longer periods of time. Another goals was to create a home exercise program, which we did today. She will exercise on her recumbant bike 3 additional days for 15-30 minutes per session.      Expected Outcomes Will continue to monitor pt and progress workloads as tolerated without sign or symptom. Pt will add in 3 days of exercise on her recumbant bike for 15-30 minutes. Will continue to monitor pt and progress workloads as tolerated  without sign or symptom.                Discharge Exercise Prescription (Final Exercise Prescription Changes):  Exercise Prescription Changes - 10/12/20 1600       Response to Exercise   Blood Pressure (Admit) 102/60    Blood Pressure (Exercise) 121/74    Blood Pressure (Exit) 112/68    Heart Rate (Admit) 76 bpm    Heart Rate (Exercise) 97 bpm    Heart Rate (Exit) 60 bpm    Rating of Perceived Exertion (Exercise) 12    Perceived Dyspnea (Exercise) 0    Symptoms 0    Comments Reviewed MET's, goals and home Ex Rx    Duration Progress to 30 minutes of  aerobic without signs/symptoms of physical distress    Intensity THRR unchanged      Progression   Progression Continue to progress workloads to maintain intensity without signs/symptoms of physical distress.    Average METs 2      Resistance Training   Training Prescription Yes    Weight 3    Reps 10-15      T5 Nustep   Level 1    SPM 70    Minutes 20    METs 2      Home Exercise Plan   Plans to continue exercise at Home (comment)    Frequency Add 3 additional days to program exercise sessions.    Initial Home Exercises Provided 10/12/20             Nutrition:  Target Goals: Understanding of nutrition guidelines, daily intake of sodium <1531m, cholesterol <2054m calories 30% from fat and 7% or less from saturated fats, daily to have 5 or more servings of fruits and vegetables.  Biometrics:  Pre Biometrics -  09/20/20 1058       Pre Biometrics   Waist Circumference 60.5 inches    Hip Circumference 61.5 inches    Waist to Hip Ratio 0.98 %    Triceps Skinfold 47 mm    % Body Fat 61.7 %    Grip Strength 28 kg    Flexibility 5 in    Single Leg Stand 16.1 seconds              Nutrition Therapy Plan and Nutrition Goals:  Nutrition Therapy & Goals - 10/01/20 1100       Nutrition Therapy   Diet TLC    Drug/Food Interactions Statins/Certain Fruits;Coumadin/Vit K      Personal Nutrition Goals    Nutrition Goal Pt to identify food quantities necessary to achieve weight loss of 6-24 lb at graduation from cardiac rehab.    Personal Goal #2 Pt to build a healthy plate including vegetables, fruits, whole grains, and low-fat dairy products in a heart healthy meal plan.    Personal Goal #3 Pt to exercise at home      Intervention Plan   Intervention Prescribe, educate and counsel regarding individualized specific dietary modifications aiming towards targeted core components such as weight, hypertension, lipid management, diabetes, heart failure and other comorbidities.;Nutrition handout(s) given to patient.    Expected Outcomes Short Term Goal: A plan has been developed with personal nutrition goals set during dietitian appointment.;Long Term Goal: Adherence to prescribed nutrition plan.             Nutrition Assessments:  MEDIFICTS Score Key: ?70 Need to make dietary changes  40-70 Heart Healthy Diet ? 40 Therapeutic Level Cholesterol Diet  Flowsheet Row CARDIAC REHAB PHASE II EXERCISE from 09/24/2020 in [facility]  Picture Your Plate Total Score on Admission 65      Picture Your Plate Scores: <01 Unhealthy dietary pattern with much room for improvement. 41-50 Dietary pattern unlikely to meet recommendations for good health and room for improvement. 51-60 More healthful dietary pattern, with some room for improvement.  >60 Healthy dietary pattern, although there may be some specific behaviors that could be improved.    Nutrition Goals Re-Evaluation:  Nutrition Goals Re-Evaluation     [place] Name 10/01/20 1100 10/15/20 1009           Goals   Current Weight 314 lb (142.4 kg) 315 lb 14.7 oz (143.3 kg)      Nutrition Goal -- Pt to identify food quantities necessary to achieve weight loss of 6-24 lb at graduation from cardiac rehab.             Personal Goal #2 Re-Evaluation   Personal Goal #2 -- Pt to build a healthy plate including  vegetables, fruits, whole grains, and low-fat dairy products in a heart healthy meal plan.             Personal Goal #3 Re-Evaluation   Personal Goal #3 -- Pt to exercise at home               Nutrition Goals Discharge (Final Nutrition Goals Re-Evaluation):  Nutrition Goals Re-Evaluation - 10/15/20 1009       Goals   Current Weight 315 lb 14.7 oz (143.3 kg)    Nutrition Goal Pt to identify food quantities necessary to achieve weight loss of 6-24 lb at graduation from cardiac rehab.      Personal Goal #2 Re-Evaluation   Personal Goal #2 Pt to build a healthy  plate including vegetables, fruits, whole grains, and low-fat dairy products in a heart healthy meal plan.      Personal Goal #3 Re-Evaluation   Personal Goal #3 Pt to exercise at home             Psychosocial: Target Goals: Acknowledge presence or absence of significant depression and/or stress, maximize coping skills, provide positive support system. Participant is able to verbalize types and ability to use techniques and skills needed for reducing stress and depression.  Initial Review & Psychosocial Screening:  Initial Psych Review & Screening - 09/20/20 1000       Initial Review   Current issues with Current Stress Concerns    Source of Stress Concerns Family    Comments Riki's daughter and infant grandson live with her, history of panic disorder      Family Dynamics   Good Support System? Yes   Ray lives with her daughter and has 2 son's for support     Barriers   Psychosocial barriers to participate in program The patient should benefit from training in stress management and relaxation.      Screening Interventions   Interventions Encouraged to exercise;Provide feedback about the scores to participant;To provide support and resources with identified psychosocial needs    Expected Outcomes Long Term Goal: Stressors or current issues are controlled or eliminated.;Short Term goal: Identification and review  with participant of any Quality of Life or Depression concerns found by scoring the questionnaire.             Quality of Life Scores:  Quality of Life - 09/20/20 1105       Quality of Life   Select Quality of Life      Quality of Life Scores   Health/Function Pre 18.17 %    Socioeconomic Pre 18.86 %    Psych/Spiritual Pre 20.79 %    Family Pre 15.6 %    GLOBAL Pre 18.47 %            Scores of 19 and below usually indicate a poorer quality of life in these areas.  A difference of  2-3 points is a clinically meaningful difference.  A difference of 2-3 points in the total score of the Quality of Life Index has been associated with significant improvement in overall quality of life, self-image, physical symptoms, and general health in studies assessing change in quality of life.  PHQ-9: Recent Review Flowsheet Data     Depression screen [facility] 2/9 09/20/2020 01/04/2019 11/16/2017 10/03/2016   Decreased Interest 0 0 1 0   Down, Depressed, Hopeless 0 0 0 0   PHQ - 2 Score 0 0 1 0   Altered sleeping - 1 - 0   Tired, decreased energy - 1 - 2   Change in appetite - 1 - 1   Feeling bad or failure about yourself  - 1 - 0   Trouble concentrating - 0 - 0   Moving slowly or fidgety/restless - 0 - 0   Suicidal thoughts - 0 - 0   PHQ-9 Score - 4 - 3      Interpretation of Total Score  Total Score Depression Severity:  1-4 = Minimal depression, 5-9 = Mild depression, 10-14 = Moderate depression, 15-19 = Moderately severe depression, 20-27 = Severe depression   Psychosocial Evaluation and Intervention:   Psychosocial Re-Evaluation:  Psychosocial Re-Evaluation     Row Name 10/16/20 [phone]  Psychosocial Re-Evaluation   Current issues with Current Stress Concerns       Comments Marlet's quality of life was reviewed. Evanna denies being depressed. Emilene continues to have family concerns as Adlene helps take care of her infant grandson       Interventions Stress management  education;Encouraged to attend Cardiac Rehabilitation for the exercise       Continue Psychosocial Services  No Follow up required               Initial Review   Source of Stress Concerns Family       Comments Will continue to monitor and offer support as needed.                Psychosocial Discharge (Final Psychosocial Re-Evaluation):  Psychosocial Re-Evaluation - 10/16/20 1629       Psychosocial Re-Evaluation   Current issues with Current Stress Concerns    Comments Kinzy's quality of life was reviewed. Chakara denies being depressed. Samiyyah continues to have family concerns as Almarie helps take care of her infant grandson    Interventions Stress management education;Encouraged to attend Cardiac Rehabilitation for the exercise    Continue Psychosocial Services  No Follow up required      Initial Review   Source of Stress Concerns Family    Comments Will continue to monitor and offer support as needed.             Vocational Rehabilitation: Provide vocational rehab assistance to qualifying candidates.   Vocational Rehab Evaluation & Intervention:  Vocational Rehab - 09/20/20 1001       Initial Vocational Rehab Evaluation & Intervention   Assessment shows need for Vocational Rehabilitation No   Kazoua works full time and does not need vocational rehab at this time            Education: Education Goals: Education classes will be provided on a weekly basis, covering required topics. Participant will state understanding/return demonstration of topics presented.  Learning Barriers/Preferences:  Learning Barriers/Preferences - 09/20/20 1106       Learning Barriers/Preferences   Learning Barriers Sight   Pt wears contacts   Learning Preferences Audio;Computer/Internet;Group Instruction;Individual Instruction;Pictoral;Skilled Demonstration;Verbal Instruction;Video;Written Material             Education Topics: Hypertension, Hypertension Reduction -Define heart  disease and high blood pressure. Discus how high blood pressure affects the body and ways to reduce high blood pressure.   Exercise and Your Heart -Discuss why it is important to exercise, the FITT principles of exercise, normal and abnormal responses to exercise, and how to exercise safely.   Angina -Discuss definition of angina, causes of angina, treatment of angina, and how to decrease risk of having angina.   Cardiac Medications -Review what the following cardiac medications are used for, how they affect the body, and side effects that may occur when taking the medications.  Medications include Aspirin, Beta blockers, calcium channel blockers, ACE Inhibitors, angiotensin receptor blockers, diuretics, digoxin, and antihyperlipidemics.   Congestive Heart Failure -Discuss the definition of CHF, how to live with CHF, the signs and symptoms of CHF, and how keep track of weight and sodium intake.   Heart Disease and Intimacy -Discus the effect sexual activity has on the heart, how changes occur during intimacy as we age, and safety during sexual activity.   Smoking Cessation / COPD -Discuss different methods to quit smoking, the health benefits of quitting smoking, and the definition of COPD.   Nutrition I: Fats -Discuss  the types of cholesterol, what cholesterol does to the heart, and how cholesterol levels can be controlled.   Nutrition II: Labels -Discuss the different components of food labels and how to read food label   Heart Parts/Heart Disease and PAD -Discuss the anatomy of the heart, the pathway of blood circulation through the heart, and these are affected by heart disease.   Stress I: Signs and Symptoms -Discuss the causes of stress, how stress may lead to anxiety and depression, and ways to limit stress.   Stress II: Relaxation -Discuss different types of relaxation techniques to limit stress.   Warning Signs of Stroke / TIA -Discuss definition of a stroke,  what the signs and symptoms are of a stroke, and how to identify when someone is having stroke.   Knowledge Questionnaire Score:  Knowledge Questionnaire Score - 09/20/20 1106       Knowledge Questionnaire Score   Pre Score 23/24             Core Components/Risk Factors/Patient Goals at Admission:  Personal Goals and Risk Factors at Admission - 09/20/20 1108       Core Components/Risk Factors/Patient Goals on Admission    Weight Management Yes;Weight Loss    Intervention Weight Management: Develop a combined nutrition and exercise program designed to reach desired caloric intake, while maintaining appropriate intake of nutrient and fiber, sodium and fats, and appropriate energy expenditure required for the weight goal.;Weight Management: Provide education and appropriate resources to help participant work on and attain dietary goals.;Weight Management/Obesity: Establish reasonable short term and long term weight goals.;Obesity: Provide education and appropriate resources to help participant work on and attain dietary goals.    Admit Weight 314 lb 6 oz (142.6 kg)    Expected Outcomes Short Term: Continue to assess and modify interventions until short term weight is achieved;Long Term: Adherence to nutrition and physical activity/exercise program aimed toward attainment of established weight goal;Weight Loss: Understanding of general recommendations for a balanced deficit meal plan, which promotes 1-2 lb weight loss per week and includes a negative energy balance of [phone] kcal/d;Understanding recommendations for meals to include 15-35% energy as protein, 25-35% energy from fat, 35-60% energy from carbohydrates, less than 251m of dietary cholesterol, 20-35 gm of total fiber daily;Understanding of distribution of calorie intake throughout the day with the consumption of 4-5 meals/snacks    Hypertension Yes    Intervention Provide education on lifestyle modifcations including regular  physical activity/exercise, weight management, moderate sodium restriction and increased consumption of fresh fruit, vegetables, and low fat dairy, alcohol moderation, and smoking cessation.;Monitor prescription use compliance.    Expected Outcomes Short Term: Continued assessment and intervention until BP is < 140/968mHG in hypertensive participants. < 130/8054mG in hypertensive participants with diabetes, heart failure or chronic kidney disease.;Long Term: Maintenance of blood pressure at goal levels.    Lipids Yes    Intervention Provide education and support for participant on nutrition & aerobic/resistive exercise along with prescribed medications to achieve LDL <102m51mDL >40mg78m Expected Outcomes Short Term: Participant states understanding of desired cholesterol values and is compliant with medications prescribed. Participant is following exercise prescription and nutrition guidelines.;Long Term: Cholesterol controlled with medications as prescribed, with individualized exercise RX and with personalized nutrition plan. Value goals: LDL < 102mg,68m > 40 mg.    Stress Yes    Intervention Offer individual and/or small group education and counseling on adjustment to heart disease, stress management and health-related lifestyle change. Teach and  support self-help strategies.;Refer participants experiencing significant psychosocial distress to appropriate mental health specialists for further evaluation and treatment. When possible, include family members and significant others in education/counseling sessions.    Expected Outcomes Short Term: Participant demonstrates changes in health-related behavior, relaxation and other stress management skills, ability to obtain effective social support, and compliance with psychotropic medications if prescribed.;Long Term: Emotional wellbeing is indicated by absence of clinically significant psychosocial distress or social isolation.    Personal Goal Other Yes     Personal Goal Short term: Create a home exercise plan. Long term: Rollar skate, increase mobility, lose wt    Intervention Will continue to monitor pt and progress workloads as tolerated without sign or symptom    Expected Outcomes Pt will achieve her goals             Core Components/Risk Factors/Patient Goals Review:   Goals and Risk Factor Review     Row Name 09/25/20 0759 10/16/20 1632           Core Components/Risk Factors/Patient Goals Review   Personal Goals Review Weight Management/Obesity;Hypertension;Lipids;Stress Weight Management/Obesity;Hypertension;Lipids;Stress      Review Trissa started exercise on 09/24/20 and did well with exercise for her fitness level.VSS Rose-Marie has been doing well with exercise for her fitness level. Vital signs have been stable. Average met level around 2.0      Expected Outcomes Jahniyah will continue to participate in phase 2 cardiac rehab for exercise, nutrition and risk factor modification Thana will continue to participate in phase 2 cardiac rehab for exercise, nutrition and risk factor modification               Core Components/Risk Factors/Patient Goals at Discharge (Final Review):   Goals and Risk Factor Review - 10/16/20 1632       Core Components/Risk Factors/Patient Goals Review   Personal Goals Review Weight Management/Obesity;Hypertension;Lipids;Stress    Review Erian has been doing well with exercise for her fitness level. Vital signs have been stable. Average met level around 2.0    Expected Outcomes Khaleah will continue to participate in phase 2 cardiac rehab for exercise, nutrition and risk factor modification             ITP Comments:  ITP Comments     Row Name 09/20/20 0959 09/25/20 0756 10/16/20 1626       ITP Comments Dr Fransico Him MD, Medical Director 30 Day ITP Review. Alyse started exercise at cardiac rehab on 09/25/20 and did well with exercise for her fitness level 30 Day ITP Review. Aryona has good participation  in phase 2 cardiac rehab when in attendance.              Comments: See ITP comments.Harrell Gave RN BSN

## 2020-10-17 ENCOUNTER — Other Ambulatory Visit: Payer: Self-pay

## 2020-10-17 ENCOUNTER — Encounter (HOSPITAL_COMMUNITY)
Admission: RE | Admit: 2020-10-17 | Discharge: 2020-10-17 | Disposition: A | Discharge status: Home / Self Care | Payer: 59 | Source: Ambulatory Visit | Attending: Internal Medicine | Admitting: Internal Medicine

## 2020-10-17 DIAGNOSIS — Z952 Presence of prosthetic heart valve: Secondary | ICD-10-CM | POA: Diagnosis not present

## 2020-10-19 ENCOUNTER — Other Ambulatory Visit: Payer: Self-pay

## 2020-10-19 ENCOUNTER — Encounter (HOSPITAL_COMMUNITY)
Admission: RE | Admit: 2020-10-19 | Discharge: 2020-10-19 | Disposition: A | Discharge status: Home / Self Care | Payer: 59 | Source: Ambulatory Visit | Attending: Internal Medicine | Admitting: Internal Medicine

## 2020-10-19 DIAGNOSIS — Z952 Presence of prosthetic heart valve: Secondary | ICD-10-CM | POA: Diagnosis not present

## 2020-10-22 ENCOUNTER — Encounter (HOSPITAL_COMMUNITY): Payer: 59

## 2020-10-22 ENCOUNTER — Telehealth (HOSPITAL_COMMUNITY): Payer: Self-pay | Admitting: Family Medicine

## 2020-10-23 ENCOUNTER — Ambulatory Visit (INDEPENDENT_AMBULATORY_CARE_PROVIDER_SITE_OTHER): Payer: 59 | Admitting: Family Medicine

## 2020-10-23 ENCOUNTER — Encounter: Payer: Self-pay | Admitting: Family Medicine

## 2020-10-23 ENCOUNTER — Other Ambulatory Visit: Payer: Self-pay

## 2020-10-23 VITALS — BP 128/82 | HR 79 | Temp 97.4°F | Ht 65.0 in | Wt 318.4 lb

## 2020-10-23 DIAGNOSIS — Z23 Encounter for immunization: Secondary | ICD-10-CM

## 2020-10-23 DIAGNOSIS — E538 Deficiency of other specified B group vitamins: Secondary | ICD-10-CM

## 2020-10-23 DIAGNOSIS — I10 Essential (primary) hypertension: Secondary | ICD-10-CM | POA: Diagnosis not present

## 2020-10-23 DIAGNOSIS — N182 Chronic kidney disease, stage 2 (mild): Secondary | ICD-10-CM

## 2020-10-23 DIAGNOSIS — I350 Nonrheumatic aortic (valve) stenosis: Secondary | ICD-10-CM

## 2020-10-23 DIAGNOSIS — E7800 Pure hypercholesterolemia, unspecified: Secondary | ICD-10-CM

## 2020-10-23 DIAGNOSIS — Z7901 Long term (current) use of anticoagulants: Secondary | ICD-10-CM

## 2020-10-23 DIAGNOSIS — F411 Generalized anxiety disorder: Secondary | ICD-10-CM

## 2020-10-23 DIAGNOSIS — D508 Other iron deficiency anemias: Secondary | ICD-10-CM

## 2020-10-23 MED ORDER — OMEPRAZOLE 20 MG PO CPDR: 20.0000 mg | DELAYED_RELEASE_CAPSULE | Freq: Two times a day (BID) | ORAL | 1 refills | Status: DC

## 2020-10-23 MED ORDER — WARFARIN SODIUM 7.5 MG PO TABS: ORAL_TABLET | ORAL | 3 refills | Status: DC

## 2020-10-23 NOTE — Assessment & Plan Note (Signed)
bp in fair control at this time  BP Readings from Last 1 Encounters:  10/23/20 128/82   No changes needed Most recent labs reviewed  Disc lifstyle change with low sodium diet and exercise  Plan to continue metoprolol 50 mg bid and cardiology care  Doing well s/p valve replacement

## 2020-10-23 NOTE — Assessment & Plan Note (Signed)
Taking warfarin  Past h/o hypercoag state and DVT Lab Results  Component Value Date   INR 2.3 10/04/2020   INR 1.5 (A) 07/03/2020   INR 1.8 (A) 06/12/2020   PROTIME 15.6 (H) 12/06/2012   PROTIME 19.2 (H) 06/03/2012

## 2020-10-23 NOTE — Progress Notes (Signed)
Subjective:    Patient ID: [name], female    DOB: 12-Feb-1965, 55 y.o.   MRN: [id number]  This visit occurred during the SARS-CoV-2 public health emergency.  Safety protocols were in place, including screening questions prior to the visit, additional usage of staff PPE, and extensive cleaning of exam room while observing appropriate contact time as indicated for disinfecting solutions.   HPI Pt presents for f/u of chronic medical problems   Wt Readings from Last 3 Encounters:  10/23/20 (!) 318 lb 6 oz (144.4 kg)  09/27/20 (!) 314 lb 12.8 oz (142.8 kg)  09/20/20 (!) 314 lb 6 oz (142.6 kg)   52.98 kg/m  Doing ok overall  Cardiac rehab since her valve replacement  Getting stronger overall  (but overdid it last week)   HTN bp is stable today  No cp or palpitations or headaches or edema  No side effects to medicines  BP Readings from Last 3 Encounters:  10/23/20 128/82  09/27/20 (!) 142/83  09/20/20 108/74  Metoprolol 50 mg bid   Sees cardiology  Takes coumadin -chronic anticoag with DVT in the past  Had valve replacement also   Was at coumadin clinic at [facility] and last INR was normal  May come back to [place] in 3 months   Lab Results  Component Value Date   INR 2.3 10/04/2020   INR 1.5 (A) 07/03/2020   INR 1.8 (A) 06/12/2020   PROTIME 15.6 (H) 12/06/2012   PROTIME 19.2 (H) 06/03/2012     Hypothyroidism Lab Results  Component Value Date   TSH 0.66 08/03/2020   Taking levothyroxine 200 mcg daily   Vit B12 def  Takes B12 inj every 30 days  Lab Results  Component Value Date   VITAMINB12 705 01/04/2019   Makes a difference and feels better with it  Had a shot on 10/04/20  No missed shots      Prediabetes Lab Results  Component Value Date   HGBA1C 5.7 (H) 04/05/2020    Hyperlipidemia Lab Results  Component Value Date   CHOL 163 09/07/2020   HDL 57 09/07/2020   LDLCALC 91 09/07/2020   LDLDIRECT 203.7 06/03/2011   TRIG 79  09/07/2020   CHOLHDL 2.9 09/07/2020   Zetia 10 mg daily  Crestor 40 mg daily  Paxil for GAD Still works for her and mood is better  Would like to wean off when ready   Anemia Lab Results  Component Value Date   WBC 6.8 09/27/2020   HGB 12.1 09/27/2020   HCT 40.0 09/27/2020   MCV 82.8 09/27/2020   PLT 173 09/27/2020   Lab Results  Component Value Date   IRON 32 (L) 09/27/2020   TIBC 315 09/27/2020   FERRITIN 107 09/27/2020    No recent iron infusion  Not craving ice   Renal function has been stable Lab Results  Component Value Date   CREATININE 1.05 06/18/2020   BUN 16 06/18/2020   NA 138 06/18/2020   K 5.0 06/18/2020   CL 102 06/18/2020   CO2 29 06/18/2020    [facility]   Patient Active Problem List   Diagnosis Date Noted   IDA (iron deficiency anemia) 06/28/2020   Iron deficiency anemia 06/18/2020   History of atrial fibrillation 06/18/2020   S/P aortic valve replacement with bioprosthetic valve 04/06/2020   Coronary artery disease 04/06/2020   Chest pain of uncertain etiology 63/14/9702  Esophageal diverticulum    Esophageal dysphagia    Fatigue 05/20/2019   Systemic lupus erythematosus with lung involvement ([place]) 01/04/2019   Aortic stenosis 08/15/2017   Vitamin B12 deficiency 11/15/2015   Nausea & vomiting 10/15/2015   Allergic rhinitis 07/01/2015   History of cerebral hemorrhage 07/01/2015   Chronic daily headache 06/29/2015   Screening for osteoporosis 11/04/2013   Encounter for therapeutic drug monitoring 03/25/2013   CKD (chronic kidney disease) stage 2, GFR 60-89 ml/min 12/20/2012   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[place]) 12/20/2012   Routine general medical examination at a health care facility 12/07/2012   Steroid long-term use 12/07/2012   History of HPV infection 12/07/2012   Long term (current) use of anticoagulants 10/06/2012   Chronic anticoagulation 04/22/2012   S/P bariatric surgery 04/22/2012    Homozygous Factor V Leiden mutation ([place]) 04/22/2012   OSA on CPAP 11/20/2011   Prediabetes 09/16/2010   EDEMA 01/08/2010   PLEURAL EFFUSION 08/27/2009   ENLARGEMENT OF LYMPH NODES 08/13/2009   History of pulmonary embolism 05/24/2009   Morbid obesity ([place]) 12/08/2007   Hypothyroidism 06/09/2006   HYPERCHOLESTEROLEMIA 06/09/2006   Generalized anxiety disorder 06/09/2006   PANIC DISORDER 06/09/2006   Essential hypertension 06/09/2006   GLOMERULONEPHRITIS 06/09/2006   Systemic lupus erythematosus ([place]) 06/09/2006   DVT, HX OF 06/09/2006   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    Loculated-chronic on Left-VanTright; s/p VATS 5/10   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   GERD (gastroesophageal reflux disease)    on prilosec r/t gastric sleeve surgery   HLD (hyperlipidemia)    Iron deficiency anemia    IV dextran [place]   Nephritis and nephropathy, not specified as acute or chronic, with unspecified pathological lesion in kidney    Nonspecific abnormal results of liver function study    Obesity, unspecified    Other specified acquired hypothyroidism    Panic disorder without agoraphobia    Personal history of venous thrombosis and embolism 2002   during pregnancy; ?factor 5 leiden (sees heme)   Pleural effusion 2005   c/w lupus initial w/u; recurrent Right as pred tapered off July 2011   Sleep apnea    Systemic lupus erythematosus ([place])    renal GN, hx of pericardial eff in late 90's   Unspecified essential hypertension    Past Surgical History:  Procedure Laterality Date   AORTIC VALVE REPLACEMENT N/A 04/06/2020   Procedure: AORTIC VALVE REPLACEMENT (AVR) USING INSPIRIS 21 MM AORTIC VALVE;  Surgeon: [name], MD;  Location: [facility];   Service: Open Heart Surgery;  Laterality: N/A;   BIOPSY  04/12/2019   Procedure: BIOPSY;  Surgeon: Thornton Park, MD;  Location: [facility];  Service: Gastroenterology;;   CARDIAC CATHETERIZATION     ESOPHAGEAL MANOMETRY N/A 07/15/2019   Procedure: ESOPHAGEAL MANOMETRY (EM);  Surgeon: Mauri Pole, MD;  Location: [facility];  Service: Endoscopy;  Laterality: N/A;   ESOPHAGOGASTRODUODENOSCOPY (EGD) WITH PROPOFOL N/A 04/12/2019   Procedure: ESOPHAGOGASTRODUODENOSCOPY (EGD) WITH PROPOFOL;  Surgeon: Thornton Park, MD;  Location: [facility];  Service: Gastroenterology;  Laterality: N/A;   gastric sleeve  09/04/2010   bariatric surgery Dr Evorn Gong    LAPAROSCOPIC TUBAL LIGATION  12/09/2010  Procedure: LAPAROSCOPIC TUBAL LIGATION;  Surgeon: Darlyn Chamber;  Location: [facility];  Service: Gynecology;  Laterality: Bilateral;  Attempted see Nursing note   pleurocentesis  10/04/2004   Pleuryx catheter placement  10/04/2009   VanTright----removed 9/11   RENAL BIOPSY  09/24/2012   RIGHT/LEFT HEART CATH AND CORONARY ANGIOGRAPHY N/A 01/19/2020   Procedure: RIGHT/LEFT HEART CATH AND CORONARY ANGIOGRAPHY;  Surgeon: Lorretta Harp, MD;  Location: [facility];  Service: Cardiovascular;  Laterality: N/A;   svd     x 3   TEE WITHOUT CARDIOVERSION N/A 04/06/2020   Procedure: TRANSESOPHAGEAL ECHOCARDIOGRAM (TEE);  Surgeon: [name], MD;  Location: [place] [facility];  Service: Open Heart Surgery;  Laterality: N/A;   THORACENTESIS  02/04/2008   with penumonia   WISDOM TOOTH EXTRACTION     Social History   Tobacco Use   Smoking status: Former    Packs/day: 0.50    Years: 10.00    Pack years: 5.00    Types: Cigarettes    Quit date: 05/04/2009    Years since quitting: 11.4   Smokeless tobacco: Never   Tobacco comments:    Socially x10 years (1 pack/month)  Vaping Use   Vaping Use: Never used  Substance Use Topics   Alcohol use: Not Currently    Alcohol/week: 0.0 standard drinks     Comment: rare   Drug use: No   Family History  Problem Relation Age of Onset   Lupus Sister    Hypertension Father    Hyperlipidemia Father    Leukemia Sister    Diabetes Other        GM   Colon cancer Neg Hx    Esophageal cancer Neg Hx    Pancreatic cancer Neg Hx    Allergies  Allergen Reactions   Lisinopril Cough   Lovenox [Enoxaparin Sodium] Itching   Moxifloxacin Other (See Comments)    REACTION: hallucinations   Lovenox [Enoxaparin]     Itching    Current Outpatient Medications on File Prior to Visit  Medication Sig Dispense Refill   albuterol (VENTOLIN HFA) 108 (90 Base) MCG/ACT inhaler Inhale 1-2 puffs into the lungs every 6 (six) hours as needed for wheezing or shortness of breath. 18 g 0   amoxicillin (AMOXIL) 500 MG tablet Take 4 tabs (2000 mg) 30-60 min before dental procedures 4 tablet 1   aspirin EC 81 MG EC tablet Take 1 tablet (81 mg total) by mouth daily. Swallow whole. 30 tablet 11   bismuth subsalicylate (PEPTO BISMOL) 262 MG/15ML suspension Take 30 mLs by mouth every 6 (six) hours as needed for indigestion or diarrhea or loose stools.     calcium carbonate (TUMS - DOSED IN MG ELEMENTAL CALCIUM) 500 MG chewable tablet Chew 1-2 tablets by mouth 3 (three) times daily as needed for indigestion or heartburn.     cyanocobalamin (,VITAMIN B-12,) 1000 MCG/ML injection Inject 1,000 mcg into the muscle every 30 (thirty) days.      ezetimibe (ZETIA) 10 MG tablet Take 1 tablet (10 mg total) by mouth daily. 90 tablet 3   hydroxychloroquine (PLAQUENIL) 200 MG tablet Take 200 mg by mouth 2 (two) times daily.     levothyroxine (SYNTHROID) 200 MCG tablet Take 200 mcg by mouth daily before breakfast.     metoprolol tartrate (LOPRESSOR) 50 MG tablet Take 1 tablet (50 mg total) by mouth 2 (two) times daily. 180 tablet 3   Multiple Vitamins-Minerals (BARIATRIC MULTIVITAMINS/IRON) CAPS Take 1 tablet by mouth daily.  PARoxetine (PAXIL) 40 MG tablet TAKE 1 TABLET BY MOUTH EVERY DAY  IN THE MORNING 90 tablet 3   rosuvastatin (CRESTOR) 40 MG tablet Take 1 tablet (40 mg total) by mouth daily. 90 tablet 3   No current facility-administered medications on file prior to visit.      Review of Systems  Constitutional:  Positive for fatigue. Negative for activity change, appetite change, fever and unexpected weight change.  HENT:  Negative for congestion, ear pain, rhinorrhea, sinus pressure and sore throat.   Eyes:  Negative for pain, redness and visual disturbance.  Respiratory:  Negative for cough, shortness of breath and wheezing.   Cardiovascular:  Negative for chest pain and palpitations.  Gastrointestinal:  Negative for abdominal pain, blood in stool, constipation and diarrhea.  Endocrine: Negative for polydipsia and polyuria.  Genitourinary:  Negative for dysuria, frequency and urgency.  Musculoskeletal:  Negative for arthralgias, back pain and myalgias.  Skin:  Negative for pallor and rash.  Allergic/Immunologic: Negative for environmental allergies.  Neurological:  Negative for dizziness, syncope and headaches.  Hematological:  Negative for adenopathy. Does not bruise/bleed easily.  Psychiatric/Behavioral:  Negative for decreased concentration and dysphoric mood. The patient is not nervous/anxious.       Objective:   Physical Exam Constitutional:      General: She is not in acute distress.    Appearance: Normal appearance. She is well-developed. She is obese. She is not ill-appearing or diaphoretic.  HENT:     Head: Normocephalic and atraumatic.  Eyes:     Conjunctiva/sclera: Conjunctivae normal.     Pupils: Pupils are equal, round, and reactive to light.  Neck:     Thyroid: No thyromegaly.     Vascular: No carotid bruit or JVD.  Cardiovascular:     Rate and Rhythm: Normal rate and regular rhythm.     Heart sounds: Murmur heard.    No gallop.     Comments: Systolic M Pulmonary:     Effort: Pulmonary effort is normal. No respiratory distress.      Breath sounds: Normal breath sounds. No wheezing or rales.  Abdominal:     General: There is no distension or abdominal bruit.     Tenderness: There is no abdominal tenderness.  Musculoskeletal:     Cervical back: Normal range of motion and neck supple.     Right lower leg: No edema.     Left lower leg: No edema.     Comments: Edema is no change from baseline  Lymphadenopathy:     Cervical: No cervical adenopathy.  Skin:    General: Skin is warm and dry.     Coloration: Skin is not pale.     Findings: No erythema or rash.  Neurological:     Mental Status: She is alert.     Coordination: Coordination normal.     Deep Tendon Reflexes: Reflexes are normal and symmetric. Reflexes normal.  Psychiatric:        Mood and Affect: Mood normal.        Cognition and Memory: Cognition and memory normal.     Comments: Pleasant  Good mood          Assessment & Plan:   Problem List Items Addressed This Visit       Cardiovascular and Mediastinum   Essential hypertension    bp in fair control at this time  BP Readings from Last 1 Encounters:  10/23/20 128/82  No changes needed Most recent labs reviewed  Disc  lifstyle change with low sodium diet and exercise  Plan to continue metoprolol 50 mg bid and cardiology care  Doing well s/p valve replacement      Relevant Medications   warfarin (COUMADIN) 7.5 MG tablet   Other Relevant Orders   CBC with Differential/Platelet   Basic metabolic panel   Aortic stenosis    Doing well in cardiac rehab s/p valve replacement      Relevant Medications   warfarin (COUMADIN) 7.5 MG tablet     Genitourinary   CKD (chronic kidney disease) stage 2, GFR 60-89 ml/min    With SLE Sees nephrology  bmet ordered  Enc good fluid intake        Other   HYPERCHOLESTEROLEMIA    Disc goals for lipids and reasons to control them Rev last labs with pt Rev low sat fat diet in detail Rev labs from august Good control with zetia 10 mg daily and  crestor 40 mg daily      Relevant Medications   warfarin (COUMADIN) 7.5 MG tablet   Morbid obesity ([place])    Discussed how this problem influences overall health and the risks it imposes  Reviewed plan for weight loss with lower calorie diet (via better food choices and also portion control or program like weight watchers) and exercise building up to or more than 30 minutes 5 days per week including some aerobic activity   Enc to stay on the diet from bariatric surgery      Generalized anxiety disorder    paxil continues to work well for her  Remains motivated      Chronic anticoagulation    Taking warfarin  Past h/o hypercoag state and DVT Lab Results  Component Value Date   INR 2.3 10/04/2020   INR 1.5 (A) 07/03/2020   INR 1.8 (A) 06/12/2020   PROTIME 15.6 (H) 12/06/2012   PROTIME 19.2 (H) 06/03/2012         Relevant Medications   warfarin (COUMADIN) 7.5 MG tablet   Long term (current) use of anticoagulants    Warfarin  Used to get INR at [facility] but may start coming to [facility] after we return to the office in the winter      Vitamin B12 deficiency - Primary    Level today  Last B12 shot on 9/1 (monthly)       Relevant Orders   Vitamin B12   Iron deficiency anemia    Labs today  Improved in august  Not craving ice  Last iron infusion was not renal      Other Visit Diagnoses     Need for influenza vaccination       Relevant Orders   Flu Vaccine QUAD 6+ mos PF IM (Fluarix Quad PF) (Completed)

## 2020-10-23 NOTE — Assessment & Plan Note (Signed)
Level today  Last B12 shot on 9/1 (monthly)

## 2020-10-23 NOTE — Assessment & Plan Note (Signed)
Discussed how this problem influences overall health and the risks it imposes  Reviewed plan for weight loss with lower calorie diet (via better food choices and also portion control or program like weight watchers) and exercise building up to or more than 30 minutes 5 days per week including some aerobic activity   Enc to stay on the diet from bariatric surgery

## 2020-10-23 NOTE — Patient Instructions (Addendum)
Labs today  Continue current medications    When you get your mammogram ask the office to send [place] a copy   Eat healthy  Continue the cardiac rehab

## 2020-10-23 NOTE — Assessment & Plan Note (Signed)
Labs today  Improved in august  Not craving ice  Last iron infusion was not renal

## 2020-10-23 NOTE — Assessment & Plan Note (Signed)
Disc goals for lipids and reasons to control them Rev last labs with pt Rev low sat fat diet in detail Rev labs from august Good control with zetia 10 mg daily and crestor 40 mg daily

## 2020-10-23 NOTE — Assessment & Plan Note (Signed)
Warfarin  Used to get INR at [facility] but may start coming to [facility] after we return to the office in the winter

## 2020-10-23 NOTE — Assessment & Plan Note (Signed)
Doing well in cardiac rehab s/p valve replacement

## 2020-10-23 NOTE — Assessment & Plan Note (Signed)
paxil continues to work well for her  Remains motivated

## 2020-10-23 NOTE — Assessment & Plan Note (Signed)
With SLE Sees nephrology  bmet ordered  Enc good fluid intake

## 2020-10-24 ENCOUNTER — Encounter (HOSPITAL_COMMUNITY)
Admission: RE | Admit: 2020-10-24 | Discharge: 2020-10-24 | Disposition: A | Discharge status: Home / Self Care | Payer: 59 | Source: Ambulatory Visit | Attending: Internal Medicine | Admitting: Internal Medicine

## 2020-10-24 DIAGNOSIS — Z952 Presence of prosthetic heart valve: Secondary | ICD-10-CM | POA: Diagnosis not present

## 2020-10-24 LAB — CBC WITH DIFFERENTIAL/PLATELET
Basophils Absolute: 0 10*3/uL (ref 0.0–0.1)
Basophils Relative: 1 % (ref 0.0–3.0)
Eosinophils Absolute: 0.4 10*3/uL (ref 0.0–0.7)
Eosinophils Relative: 8.2 % — ABNORMAL HIGH (ref 0.0–5.0)
HCT: 36.3 % (ref 36.0–46.0)
Hemoglobin: 11.4 g/dL — ABNORMAL LOW (ref 12.0–15.0)
Lymphocytes Relative: 32.6 % (ref 12.0–46.0)
Lymphs Abs: 1.5 10*3/uL (ref 0.7–4.0)
MCHC: 31.3 g/dL (ref 30.0–36.0)
MCV: 81.4 fl (ref 78.0–100.0)
Monocytes Absolute: 0.4 10*3/uL (ref 0.1–1.0)
Monocytes Relative: 9.8 % (ref 3.0–12.0)
Neutro Abs: 2.2 10*3/uL (ref 1.4–7.7)
Neutrophils Relative %: 48.4 % (ref 43.0–77.0)
Platelets: 180 10*3/uL (ref 150.0–400.0)
RBC: 4.46 Mil/uL (ref 3.87–5.11)
RDW: 17.6 % — ABNORMAL HIGH (ref 11.5–15.5)
WBC: 4.5 10*3/uL (ref 4.0–10.5)

## 2020-10-24 LAB — VITAMIN B12: Vitamin B-12: 616 pg/mL (ref 211–911)

## 2020-10-24 LAB — BASIC METABOLIC PANEL
BUN: 14 mg/dL (ref 6–23)
CO2: 30 mEq/L (ref 19–32)
Calcium: 8.9 mg/dL (ref 8.4–10.5)
Chloride: 105 mEq/L (ref 96–112)
Creatinine, Ser: 1.02 mg/dL (ref 0.40–1.20)
GFR: 62.5 mL/min (ref >60.00)
Glucose, Bld: 89 mg/dL (ref 70–99)
Potassium: 4.8 mEq/L (ref 3.5–5.1)
Sodium: 142 mEq/L (ref 135–145)

## 2020-10-26 ENCOUNTER — Other Ambulatory Visit: Payer: Self-pay | Admitting: Family Medicine

## 2020-10-26 ENCOUNTER — Other Ambulatory Visit: Payer: Self-pay

## 2020-10-26 ENCOUNTER — Encounter (HOSPITAL_COMMUNITY)
Admission: RE | Admit: 2020-10-26 | Discharge: 2020-10-26 | Disposition: A | Discharge status: Home / Self Care | Payer: 59 | Source: Ambulatory Visit | Attending: Internal Medicine | Admitting: Internal Medicine

## 2020-10-26 DIAGNOSIS — Z7901 Long term (current) use of anticoagulants: Secondary | ICD-10-CM

## 2020-10-26 DIAGNOSIS — Z952 Presence of prosthetic heart valve: Secondary | ICD-10-CM | POA: Diagnosis not present

## 2020-10-26 NOTE — Telephone Encounter (Signed)
Pt compliant with PCP and coumadin clinic apts. Sent in script for warfarin.

## 2020-10-28 ENCOUNTER — Other Ambulatory Visit: Payer: Self-pay | Admitting: Family Medicine

## 2020-10-29 ENCOUNTER — Encounter (HOSPITAL_COMMUNITY)
Admission: RE | Admit: 2020-10-29 | Discharge: 2020-10-29 | Disposition: A | Discharge status: Home / Self Care | Payer: 59 | Source: Ambulatory Visit | Attending: Internal Medicine | Admitting: Internal Medicine

## 2020-10-29 ENCOUNTER — Encounter: Payer: Self-pay | Admitting: *Deleted

## 2020-10-29 ENCOUNTER — Other Ambulatory Visit: Payer: Self-pay

## 2020-10-29 DIAGNOSIS — Z952 Presence of prosthetic heart valve: Secondary | ICD-10-CM

## 2020-10-31 ENCOUNTER — Telehealth (HOSPITAL_COMMUNITY): Payer: Self-pay | Admitting: Family Medicine

## 2020-10-31 ENCOUNTER — Encounter (HOSPITAL_COMMUNITY): Payer: 59

## 2020-11-01 ENCOUNTER — Ambulatory Visit: Payer: 59

## 2020-11-02 ENCOUNTER — Encounter (HOSPITAL_COMMUNITY): Payer: 59

## 2020-11-05 ENCOUNTER — Encounter (HOSPITAL_COMMUNITY): Payer: 59

## 2020-11-05 ENCOUNTER — Telehealth (HOSPITAL_COMMUNITY): Payer: Self-pay

## 2020-11-07 ENCOUNTER — Telehealth: Payer: Self-pay

## 2020-11-07 ENCOUNTER — Encounter (HOSPITAL_COMMUNITY): Payer: 59

## 2020-11-07 NOTE — Telephone Encounter (Signed)
Pt missed coumadin clinic apt on 9/29. LVM for pt to contact office to speak with this nurse to RS her coumadin clinic apt.

## 2020-11-09 ENCOUNTER — Encounter (HOSPITAL_COMMUNITY): Payer: 59

## 2020-11-12 ENCOUNTER — Other Ambulatory Visit: Payer: Self-pay

## 2020-11-12 ENCOUNTER — Encounter (HOSPITAL_COMMUNITY)
Admission: RE | Admit: 2020-11-12 | Discharge: 2020-11-12 | Disposition: A | Discharge status: Home / Self Care | Payer: 59 | Source: Ambulatory Visit | Attending: Internal Medicine | Admitting: Internal Medicine

## 2020-11-12 DIAGNOSIS — Z952 Presence of prosthetic heart valve: Secondary | ICD-10-CM | POA: Insufficient documentation

## 2020-11-12 NOTE — Progress Notes (Signed)
Cardiac Individual Treatment Plan  Patient Details  Name: [name] MRN: [id number] Date of Birth: 1965-08-19 Referring Provider:   Flowsheet Row CARDIAC REHAB PHASE II ORIENTATION from 09/20/2020 in [place]  Referring Provider Dr. [name], MD       Initial Encounter Date:  [place] PHASE II ORIENTATION from 09/20/2020 in [place]  Date 09/20/20       Visit Diagnosis: 04/06/20 S/P AVR (aortic valve replacement)  Patient's Home Medications on Admission:  Current Outpatient Medications:    albuterol (VENTOLIN HFA) 108 (90 Base) MCG/ACT inhaler, Inhale 1-2 puffs into the lungs every 6 (six) hours as needed for wheezing or shortness of breath., Disp: 18 g, Rfl: 0   amoxicillin (AMOXIL) 500 MG tablet, Take 4 tabs (2000 mg) 30-60 min before dental procedures, Disp: 4 tablet, Rfl: 1   aspirin EC 81 MG EC tablet, Take 1 tablet (81 mg total) by mouth daily. Swallow whole., Disp: 30 tablet, Rfl: 11   bismuth subsalicylate (PEPTO BISMOL) 262 MG/15ML suspension, Take 30 mLs by mouth every 6 (six) hours as needed for indigestion or diarrhea or loose stools., Disp: , Rfl:    calcium carbonate (TUMS - DOSED IN MG ELEMENTAL CALCIUM) 500 MG chewable tablet, Chew 1-2 tablets by mouth 3 (three) times daily as needed for indigestion or heartburn., Disp: , Rfl:    cyanocobalamin (,VITAMIN B-12,) 1000 MCG/ML injection, Inject 1,000 mcg into the muscle every 30 (thirty) days. , Disp: , Rfl:    ezetimibe (ZETIA) 10 MG tablet, Take 1 tablet (10 mg total) by mouth daily., Disp: 90 tablet, Rfl: 3   hydroxychloroquine (PLAQUENIL) 200 MG tablet, Take 200 mg by mouth 2 (two) times daily., Disp: , Rfl:    levothyroxine (SYNTHROID) 200 MCG tablet, TAKE 1 TABLET (200 MCG TOTAL) BY MOUTH DAILY BEFORE BREAKFAST., Disp: 90 tablet, Rfl: 1   metoprolol tartrate (LOPRESSOR) 50 MG tablet, Take 1 tablet (50 mg total) by mouth 2 (two)  times daily., Disp: 180 tablet, Rfl: 3   Multiple Vitamins-Minerals (BARIATRIC MULTIVITAMINS/IRON) CAPS, Take 1 tablet by mouth daily., Disp: , Rfl:    omeprazole (PRILOSEC) 20 MG capsule, Take 1 capsule (20 mg total) by mouth 2 (two) times daily before a meal., Disp: 180 capsule, Rfl: 1   PARoxetine (PAXIL) 40 MG tablet, TAKE 1 TABLET BY MOUTH EVERY DAY IN THE MORNING, Disp: 90 tablet, Rfl: 3   rosuvastatin (CRESTOR) 40 MG tablet, Take 1 tablet (40 mg total) by mouth daily., Disp: 90 tablet, Rfl: 3   warfarin (COUMADIN) 7.5 MG tablet, TAKE 1&1/2 TABLETS DAILY, EXCEPT ON MON, WED, & FRI TAKE 1 TABLET OR AS DIRECTED BY CLINIC, Disp: 45 tablet, Rfl: 3  Past Medical History: 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    Loculated-chronic on Left-Van[place]right; s/p VATS 5/10   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   GERD (gastroesophageal reflux disease)    on prilosec r/t gastric sleeve surgery   HLD (hyperlipidemia)    Iron deficiency anemia    IV dextran [place]   Nephritis and nephropathy, not specified as acute or chronic, with unspecified pathological lesion in kidney  Nonspecific abnormal results of liver function study    Obesity, unspecified    Other specified acquired hypothyroidism    Panic disorder without agoraphobia    Personal history of venous thrombosis and embolism 2002   during pregnancy; ?factor 5 leiden (sees heme)   Pleural effusion 2005   c/w lupus initial w/u; recurrent Right as pred tapered off July 2011   Sleep apnea    Systemic lupus erythematosus ([place])    renal GN, hx of pericardial eff in late 90's   Unspecified essential hypertension     Tobacco Use: Social History   Tobacco Use  Smoking Status Former   Packs/day: 0.50   Years:  10.00   Pack years: 5.00   Types: Cigarettes   Quit date: 05/04/2009   Years since quitting: 11.5  Smokeless Tobacco Never  Tobacco Comments   Socially x10 years (1 pack/month)    Labs: Recent Review Flowsheet Data     Labs for ITP Cardiac and Pulmonary Rehab Latest Ref Rng & Units 04/06/2020 04/06/2020 04/06/2020 07/06/2020 09/07/2020   Cholestrol 100 - 199 mg/dL - - - 185 163   LDLCALC 0 - 99 mg/dL - - - 109(H) 91   LDLDIRECT mg/dL - - - - -   HDL >39 mg/dL - - - 57 57   Trlycerides 0 - 149 mg/dL - - - 105 79   Hemoglobin A1c 4.8 - 5.6 % - - - - -   PHART 7.350 - 7.450 7.284(L) 7.329(L) 7.285(L) - -   PCO2ART 32.0 - 48.0 mmHg 51.6(H) 51.0(H) 53.7(H) - -   HCO3 20.0 - 28.0 mmol/L 24.8 27.0 25.7 - -   TCO2 22 - 32 mmol/L _0 - -   ACIDBASEDEF 0.0 - 2.0 mmol/L 2.0 - 1.0 - -   O2SAT % 97.0 99.0 99.0 - -       Capillary Blood Glucose: Lab Results  Component Value Date   GLUCAP 104 (H) 04/09/2020   GLUCAP 99 04/09/2020   GLUCAP 88 04/09/2020   GLUCAP 140 (H) 04/08/2020   GLUCAP 108 (H) 04/08/2020     Exercise Target Goals: Exercise Program Goal: Individual exercise prescription set using results from initial 6 min walk test and THRR while considering  patient's activity barriers and safety.   Exercise Prescription Goal: Initial exercise prescription builds to 30-45 minutes a day of aerobic activity, 2-3 days per week.  Home exercise guidelines will be given to patient during program as part of exercise prescription that the participant will acknowledge.  Activity Barriers & Risk Stratification:  Activity Barriers & Cardiac Risk Stratification - 09/20/20 1058       Activity Barriers & Cardiac Risk Stratification   Activity Barriers Balance Concerns;Deconditioning;History of Falls;Muscular Weakness    Cardiac Risk Stratification High             6 Minute Walk:  6 Minute Walk     Row Name 09/20/20 1056         6 Minute Walk   Phase Initial     Distance 857  feet     Walk Time 6 minutes     # of Rest Breaks 0     MPH 1.62     METS 1.72     RPE 13     Perceived Dyspnea  0     VO2 Peak 6.02     Symptoms Yes (comment)     Comments Left leg muscle fatigue     Resting HR  71 bpm     Resting BP 108/74     Resting Oxygen Saturation  95 %     Exercise Oxygen Saturation  during 6 min walk 90 %     Max Ex. HR 96 bpm     Max Ex. BP 140/78     2 Minute Post BP 112/68              Oxygen Initial Assessment:   Oxygen Re-Evaluation:   Oxygen Discharge (Final Oxygen Re-Evaluation):   Initial Exercise Prescription:  Initial Exercise Prescription - 09/20/20 1100       Date of Initial Exercise RX and Referring Provider   Date 09/20/20    Referring Provider Dr. [name], MD    Expected Discharge Date 11/16/20      T5 Nustep   Level 1    SPM 70    Minutes 20    METs 1.4      Intensity   THRR 40-80% of Max Heartrate 66-133    Ratings of Perceived Exertion 11-13    Perceived Dyspnea 0-4      Progression   Progression Continue progressive overload as per policy without signs/symptoms or physical distress.      Resistance Training   Training Prescription Yes    Weight 3    Reps 10-15             Perform Capillary Blood Glucose checks as needed.  Exercise Prescription Changes:   Exercise Prescription Changes     Row Name 09/24/20 1624 10/12/20 1600 10/24/20 1649 11/12/20 1638       Response to Exercise   Blood Pressure (Admit) 122/82 102/60 122/64 110/64    Blood Pressure (Exercise) 149/80 121/74 118/70 120/64    Blood Pressure (Exit) 135/86 112/68 122/68 120/68    Heart Rate (Admit) 87 bpm 76 bpm 90 bpm 83 bpm    Heart Rate (Exercise) 84 bpm 97 bpm 96 bpm 80 bpm    Heart Rate (Exit) 70 bpm 60 bpm 68 bpm 63 bpm    Oxygen Saturation (Exercise) 94 % -- -- --    Rating of Perceived Exertion (Exercise) _0 Perceived Dyspnea (Exercise) 0 0 0 0    Symptoms 0 0 0 0    Comments Pt first day of exercise  Reviewed MET's, goals and home Ex Rx Reviewed MET's Reviewed MET's and goals    Duration Progress to 30 minutes of  aerobic without signs/symptoms of physical distress Progress to 30 minutes of  aerobic without signs/symptoms of physical distress Progress to 30 minutes of  aerobic without signs/symptoms of physical distress Progress to 30 minutes of  aerobic without signs/symptoms of physical distress    Intensity THRR unchanged THRR unchanged THRR unchanged THRR unchanged         Progression   Progression Continue to progress workloads to maintain intensity without signs/symptoms of physical distress. Continue to progress workloads to maintain intensity without signs/symptoms of physical distress. Continue to progress workloads to maintain intensity without signs/symptoms of physical distress. Continue to progress workloads to maintain intensity without signs/symptoms of physical distress.    Average METs 1.9 2 2.2 2         Resistance Training   Training Prescription Yes Yes No Yes    Weight 3 3 -- 3    Reps 10-15 10-15 -- 10-15    Time -- -- -- 10 Minutes         T5 Nustep  Level _0 SPM 70 70 70 70    Minutes _1 METs 1.9 2 2.2 2         Home Exercise Plan   Plans to continue exercise at -- Home (comment) Home (comment) Home (comment)    Frequency -- Add 3 additional days to program exercise sessions. Add 3 additional days to program exercise sessions. Add 3 additional days to program exercise sessions.    Initial Home Exercises Provided -- 10/12/20 10/12/20 10/12/20             Exercise Comments:   Exercise Comments     Row Name 09/24/20 1629 10/12/20 1638 10/24/20 1650 11/12/20 1644     Exercise Comments Pt first day in the CRP2 program. Pt tolerated exercise well without sign or symptom Reviewed MET's goals and home Ex Rx. Pt is tolerating exercise well with an average MET level of 2.0. Pt states that she can tell the program is helping her gain more  mobility. Her ADL's have become easier since starting and she is able to do more things she loves like holding and rocking her grandchild for longer periods of time. Another goals was to create a home exercise program, which we did today. She will exercise on her recumbant bike 3 additional days for 15-30 minutes per session. Reviewed MET's with pt today. Pt is tolerating exercise well with an average MET level of 2.2. Will continue to monitor pt and progress workloads as tolerated without sign or symptom Reviewed MET's and goals. Pt is tolerating exercise well with an average MET level of 2.0. Pt has been absent, but is eagerly starting back today. Pt can tell her ADL's are getting easier. She is having to take less breaks on her walks and her mobilty has increased. She is still working on adding in home exercise, she says she knows what to do, she just needs to start. Offered encouragment.             Exercise Goals and Review:   Exercise Goals     Row Name 09/20/20 1103             Exercise Goals   Increase Physical Activity Yes       Intervention Provide advice, education, support and counseling about physical activity/exercise needs.;Develop an individualized exercise prescription for aerobic and resistive training based on initial evaluation findings, risk stratification, comorbidities and participant's personal goals.       Expected Outcomes Short Term: Attend rehab on a regular basis to increase amount of physical activity.;Long Term: Add in home exercise to make exercise part of routine and to increase amount of physical activity.;Long Term: Exercising regularly at least 3-5 days a week.       Increase Strength and Stamina Yes       Intervention Provide advice, education, support and counseling about physical activity/exercise needs.;Develop an individualized exercise prescription for aerobic and resistive training based on initial evaluation findings, risk stratification,  comorbidities and participant's personal goals.       Expected Outcomes Short Term: Increase workloads from initial exercise prescription for resistance, speed, and METs.;Short Term: Perform resistance training exercises routinely during rehab and add in resistance training at home;Long Term: Improve cardiorespiratory fitness, muscular endurance and strength as measured by increased METs and functional capacity (6MWT)       Able to understand and use rate of perceived exertion (RPE) scale Yes  Intervention Provide education and explanation on how to use RPE scale       Expected Outcomes Short Term: Able to use RPE daily in rehab to express subjective intensity level;Long Term:  Able to use RPE to guide intensity level when exercising independently       Knowledge and understanding of Target Heart Rate Range (THRR) Yes       Intervention Provide education and explanation of THRR including how the numbers were predicted and where they are located for reference       Expected Outcomes Short Term: Able to state/look up THRR;Long Term: Able to use THRR to govern intensity when exercising independently;Short Term: Able to use daily as guideline for intensity in rehab       Understanding of Exercise Prescription Yes       Intervention Provide education, explanation, and written materials on patient's individual exercise prescription       Expected Outcomes Short Term: Able to explain program exercise prescription;Long Term: Able to explain home exercise prescription to exercise independently                Exercise Goals Re-Evaluation :  Exercise Goals Re-Evaluation     Row Name 09/24/20 1627 10/12/20 1633 11/12/20 1640         Exercise Goal Re-Evaluation   Exercise Goals Review Increase Physical Activity;Able to understand and use rate of perceived exertion (RPE) scale;Increase Strength and Stamina;Knowledge and understanding of Target Heart Rate Range (THRR);Understanding of Exercise  Prescription Increase Physical Activity;Able to understand and use rate of perceived exertion (RPE) scale;Increase Strength and Stamina;Knowledge and understanding of Target Heart Rate Range (THRR);Understanding of Exercise Prescription Increase Physical Activity;Able to understand and use rate of perceived exertion (RPE) scale;Increase Strength and Stamina;Knowledge and understanding of Target Heart Rate Range (THRR);Understanding of Exercise Prescription     Comments Pt first day in the CRP2 program. Pt tolerated exercise well. Pt understands RPE, THRR and exercise Rx. Reviewed MET's goals and home Ex Rx. Pt is tolerating exercise well with an average MET level of 2.0. Pt states that she can tell the program is helping her gain more mobility. Her ADL's have become easier since starting and she is able to do more things she loves like holding and rocking her grandchild for longer periods of time. Another goals was to create a home exercise program, which we did today. She will exercise on her recumbant bike 3 additional days for 15-30 minutes per session. Reviewed MET's and goals. Pt is tolerating exercise well with an average MET level of 2.0. Pt has been absent, but is eagerly starting back today. Pt can tell her ADL's are getting easier. She is having to take less breaks on her walks and her mobilty has increased. She is still working on adding in home exercise, she says she knows what to do, she just needs to start. Offered encouragment.     Expected Outcomes Will continue to monitor pt and progress workloads as tolerated without sign or symptom. Pt will add in 3 days of exercise on her recumbant bike for 15-30 minutes. Will continue to monitor pt and progress workloads as tolerated without sign or symptom. Will continue to monitor pt and progress workloads as tolerated without sign or symptom.              Discharge Exercise Prescription (Final Exercise Prescription Changes):  Exercise Prescription  Changes - 11/12/20 1638       Response to Exercise   Blood  Pressure (Admit) 110/64    Blood Pressure (Exercise) 120/64    Blood Pressure (Exit) 120/68    Heart Rate (Admit) 83 bpm    Heart Rate (Exercise) 80 bpm    Heart Rate (Exit) 63 bpm    Rating of Perceived Exertion (Exercise) 12    Perceived Dyspnea (Exercise) 0    Symptoms 0    Comments Reviewed MET's and goals    Duration Progress to 30 minutes of  aerobic without signs/symptoms of physical distress    Intensity THRR unchanged      Progression   Progression Continue to progress workloads to maintain intensity without signs/symptoms of physical distress.    Average METs 2      Resistance Training   Training Prescription Yes    Weight 3    Reps 10-15    Time 10 Minutes      T5 Nustep   Level 2    SPM 70    Minutes 20    METs 2      Home Exercise Plan   Plans to continue exercise at Home (comment)    Frequency Add 3 additional days to program exercise sessions.    Initial Home Exercises Provided 10/12/20             Nutrition:  Target Goals: Understanding of nutrition guidelines, daily intake of sodium <1530m, cholesterol <2049m calories 30% from fat and 7% or less from saturated fats, daily to have 5 or more servings of fruits and vegetables.  Biometrics:  Pre Biometrics - 09/20/20 1058       Pre Biometrics   Waist Circumference 60.5 inches    Hip Circumference 61.5 inches    Waist to Hip Ratio 0.98 %    Triceps Skinfold 47 mm    % Body Fat 61.7 %    Grip Strength 28 kg    Flexibility 5 in    Single Leg Stand 16.1 seconds              Nutrition Therapy Plan and Nutrition Goals:  Nutrition Therapy & Goals - 10/01/20 1100       Nutrition Therapy   Diet TLC    Drug/Food Interactions Statins/Certain Fruits;Coumadin/Vit K      Personal Nutrition Goals   Nutrition Goal Pt to identify food quantities necessary to achieve weight loss of 6-24 lb at graduation from cardiac rehab.    Personal  Goal #2 Pt to build a healthy plate including vegetables, fruits, whole grains, and low-fat dairy products in a heart healthy meal plan.    Personal Goal #3 Pt to exercise at home      Intervention Plan   Intervention Prescribe, educate and counsel regarding individualized specific dietary modifications aiming towards targeted core components such as weight, hypertension, lipid management, diabetes, heart failure and other comorbidities.;Nutrition handout(s) given to patient.    Expected Outcomes Short Term Goal: A plan has been developed with personal nutrition goals set during dietitian appointment.;Long Term Goal: Adherence to prescribed nutrition plan.             Nutrition Assessments:  MEDIFICTS Score Key: ?70 Need to make dietary changes  40-70 Heart Healthy Diet ? 40 Therapeutic Level Cholesterol Diet   Flowsheet Row CARDIAC REHAB [facility]ASE [facility] on [facility] 65      Picture Your Plate Scores: <4<42nhealthy dietary pattern with much room for improvement. 41-50 Dietary pattern  unlikely to meet recommendations for good health and room for improvement. 51-60 More healthful dietary pattern, with some room for improvement.  >60 Healthy dietary pattern, although there may be some specific behaviors that could be improved.    Nutrition Goals Re-Evaluation:  Nutrition Goals Re-Evaluation     [place] Name 10/01/20 1100 10/15/20 1009           Goals   Current Weight 314 lb (142.4 kg) 315 lb 14.7 oz (143.3 kg)      Nutrition Goal -- Pt to identify food quantities necessary to achieve weight loss of 6-24 lb at graduation from cardiac rehab.             Personal Goal #2 Re-Evaluation   Personal Goal #2 -- Pt to build a healthy plate including vegetables, fruits, whole grains, and low-fat dairy products in a heart healthy meal plan.             Personal Goal #3 Re-Evaluation    Personal Goal #3 -- Pt to exercise at home               Nutrition Goals Re-Evaluation:  Nutrition Goals Re-Evaluation     [place] Name 10/01/20 1100 10/15/20 1009           Goals   Current Weight 314 lb (142.4 kg) 315 lb 14.7 oz (143.3 kg)      Nutrition Goal -- Pt to identify food quantities necessary to achieve weight loss of 6-24 lb at graduation from cardiac rehab.             Personal Goal #2 Re-Evaluation   Personal Goal #2 -- Pt to build a healthy plate including vegetables, fruits, whole grains, and low-fat dairy products in a heart healthy meal plan.             Personal Goal #3 Re-Evaluation   Personal Goal #3 -- Pt to exercise at home               Nutrition Goals Discharge (Final Nutrition Goals Re-Evaluation):  Nutrition Goals Re-Evaluation - 10/15/20 1009       Goals   Current Weight 315 lb 14.7 oz (143.3 kg)    Nutrition Goal Pt to identify food quantities necessary to achieve weight loss of 6-24 lb at graduation from cardiac rehab.      Personal Goal #2 Re-Evaluation   Personal Goal #2 Pt to build a healthy plate including vegetables, fruits, whole grains, and low-fat dairy products in a heart healthy meal plan.      Personal Goal #3 Re-Evaluation   Personal Goal #3 Pt to exercise at home             Psychosocial: Target Goals: Acknowledge presence or absence of significant depression and/or stress, maximize coping skills, provide positive support system. Participant is able to verbalize types and ability to use techniques and skills needed for reducing stress and depression.  Initial Review & Psychosocial Screening:  Initial Psych Review & Screening - 09/20/20 1000       Initial Review   Current issues with Current Stress Concerns    Source of Stress Concerns Family    Comments [name]'s daughter and infant grandson live with her, history of panic disorder      Family Dynamics   Good Support System? Yes   [name] lives with her daughter and  has 2 son's for support     Barriers   Psychosocial barriers to participate in program The patient  should benefit from training in stress management and relaxation.      Screening Interventions   Interventions Encouraged to exercise;Provide feedback about the scores to participant;To provide support and resources with identified psychosocial needs    Expected Outcomes Long Term Goal: Stressors or current issues are controlled or eliminated.;Short Term goal: Identification and review with participant of any Quality of Life or Depression concerns found by scoring the questionnaire.             Quality of Life Scores:  Quality of Life - 09/20/20 1105       Quality of Life   Select Quality of Life      Quality of Life Scores   Health/Function Pre 18.17 %    Socioeconomic Pre 18.86 %    Psych/Spiritual Pre 20.79 %    Family Pre 15.6 %    GLOBAL Pre 18.47 %            Scores of 19 and below usually indicate a poorer quality of life in these areas.  A difference of  2-3 points is a clinically meaningful difference.  A difference of 2-3 points in the total score of the Quality of Life Index has been associated with significant improvement in overall quality of life, self-image, physical symptoms, and general health in studies assessing change in quality of life.  PHQ-9: Recent Review Flowsheet Data     Depression screen [facility] 2/9 09/20/2020 01/04/2019 11/16/2017 10/03/2016   Decreased Interest 0 0 1 0   Down, Depressed, Hopeless 0 0 0 0   PHQ - 2 Score 0 0 1 0   Altered sleeping - 1 - 0   Tired, decreased energy - 1 - 2   Change in appetite - 1 - 1   Feeling bad or failure about yourself  - 1 - 0   Trouble concentrating - 0 - 0   Moving slowly or fidgety/restless - 0 - 0   Suicidal thoughts - 0 - 0   PHQ-9 Score - 4 - 3      Interpretation of Total Score  Total Score Depression Severity:  1-4 = Minimal depression, 5-9 = Mild depression, 10-14 = Moderate depression, 15-19 =  Moderately severe depression, 20-27 = Severe depression   Psychosocial Evaluation and Intervention:   Psychosocial Re-Evaluation:  Psychosocial Re-Evaluation     Row Name 10/16/20 1629 11/09/20 1819           Psychosocial Re-Evaluation   Current issues with Current Stress Concerns Current Stress Concerns      Comments [name]'s quality of life was reviewed. [name] denies being depressed. [name] continues to have family concerns as [name] helps take care of her infant grandson [name] denies being depressed. [place] continues to have family concerns as [name] helps take care of her infant grandson. [name]'s last day of exercise was on 10/29/20. Amalya is currently out due to work.      Interventions Stress management education;Encouraged to attend Cardiac Rehabilitation for the exercise Stress management education;Encouraged to attend Cardiac Rehabilitation for the exercise      Continue Psychosocial Services  No Follow up required No Follow up required             Initial Review   Source of Stress Concerns Family Family      Comments Will continue to monitor and offer support as needed. Will continue to monitor and offer support as needed.  Psychosocial Discharge (Final Psychosocial Re-Evaluation):  Psychosocial Re-Evaluation - 11/09/20 1819       Psychosocial Re-Evaluation   Current issues with Current Stress Concerns    Comments [name] denies being depressed. [name] continues to have family concerns as [name] helps take care of her infant grandson. [name]'s last day of exercise was on 10/29/20. [name] is currently out due to work.    Interventions Stress management education;Encouraged to attend Cardiac Rehabilitation for the exercise    Continue Psychosocial Services  No Follow up required      Initial Review   Source of Stress Concerns Family    Comments Will continue to monitor and offer support as needed.             Vocational Rehabilitation: Provide vocational rehab  assistance to qualifying candidates.   Vocational Rehab Evaluation & Intervention:  Vocational Rehab - 09/20/20 1001       Initial Vocational Rehab Evaluation & Intervention   Assessment shows need for Vocational Rehabilitation No   Janylah works full time and does not need vocational rehab at this time            Education: Education Goals: Education classes will be provided on a weekly basis, covering required topics. Participant will state understanding/return demonstration of topics presented.  Learning Barriers/Preferences:  Learning Barriers/Preferences - 09/20/20 1106       Learning Barriers/Preferences   Learning Barriers Sight   Pt wears contacts   Learning Preferences Audio;Computer/Internet;Group Instruction;Individual Instruction;Pictoral;Skilled Demonstration;Verbal Instruction;Video;Written Material             Education Topics: Count Your Pulse:  -Group instruction provided by verbal instruction, demonstration, patient participation and written materials to support subject.  Instructors address importance of being able to find your pulse and how to count your pulse when at home without a heart monitor.  Patients get hands on experience counting their pulse with staff help and individually.   Heart Attack, Angina, and Risk Factor Modification:  -Group instruction provided by verbal instruction, video, and written materials to support subject.  Instructors address signs and symptoms of angina and heart attacks.    Also discuss risk factors for heart disease and how to make changes to improve heart health risk factors.   Functional Fitness:  -Group instruction provided by verbal instruction, demonstration, patient participation, and written materials to support subject.  Instructors address safety measures for doing things around the house.  Discuss how to get up and down off the floor, how to pick things up properly, how to safely get out of a chair without  assistance, and balance training.   Meditation and Mindfulness:  -Group instruction provided by verbal instruction, patient participation, and written materials to support subject.  Instructor addresses importance of mindfulness and meditation practice to help reduce stress and improve awareness.  Instructor also leads participants through a meditation exercise.    Stretching for Flexibility and Mobility:  -Group instruction provided by verbal instruction, patient participation, and written materials to support subject.  Instructors lead participants through series of stretches that are designed to increase flexibility thus improving mobility.  These stretches are additional exercise for major muscle groups that are typically performed during regular warm up and cool down.   Hands Only CPR:  -Group verbal, video, and participation provides a basic overview of AHA guidelines for community CPR. Role-play of emergencies allow participants the opportunity to practice calling for help and chest compression technique with discussion of AED use.   Hypertension: -Group verbal  and written instruction that provides a basic overview of hypertension including the most recent diagnostic guidelines, risk factor reduction with self-care instructions and medication management.    Nutrition I class: Heart Healthy Eating:  -Group instruction provided by PowerPoint slides, verbal discussion, and written materials to support subject matter. The instructor gives an explanation and review of the Therapeutic Lifestyle Changes diet recommendations, which includes a discussion on lipid goals, dietary fat, sodium, fiber, plant stanol/sterol esters, sugar, and the components of a well-balanced, healthy diet.   Nutrition II class: Lifestyle Skills:  -Group instruction provided by PowerPoint slides, verbal discussion, and written materials to support subject matter. The instructor gives an explanation and review of  label reading, grocery shopping for heart health, heart healthy recipe modifications, and ways to make healthier choices when eating out.   Diabetes Question & Answer:  -Group instruction provided by PowerPoint slides, verbal discussion, and written materials to support subject matter. The instructor gives an explanation and review of diabetes co-morbidities, pre- and post-prandial blood glucose goals, pre-exercise blood glucose goals, signs, symptoms, and treatment of hypoglycemia and hyperglycemia, and foot care basics.   Diabetes Blitz:  -Group instruction provided by PowerPoint slides, verbal discussion, and written materials to support subject matter. The instructor gives an explanation and review of the physiology behind type 1 and type 2 diabetes, diabetes medications and rational behind using different medications, pre- and post-prandial blood glucose recommendations and Hemoglobin A1c goals, diabetes diet, and exercise including blood glucose guidelines for exercising safely.    Portion Distortion:  -Group instruction provided by PowerPoint slides, verbal discussion, written materials, and food models to support subject matter. The instructor gives an explanation of serving size versus portion size, changes in portions sizes over the last 20 years, and what consists of a serving from each food group.   Stress Management:  -Group instruction provided by verbal instruction, video, and written materials to support subject matter.  Instructors review role of stress in heart disease and how to cope with stress positively.     Exercising on Your Own:  -Group instruction provided by verbal instruction, power point, and written materials to support subject.  Instructors discuss benefits of exercise, components of exercise, frequency and intensity of exercise, and end points for exercise.  Also discuss use of nitroglycerin and activating EMS.  Review options of places to exercise outside of  rehab.  Review guidelines for sex with heart disease.   Cardiac Drugs I:  -Group instruction provided by verbal instruction and written materials to support subject.  Instructor reviews cardiac drug classes: antiplatelets, anticoagulants, beta blockers, and statins.  Instructor discusses reasons, side effects, and lifestyle considerations for each drug class.   Cardiac Drugs II:  -Group instruction provided by verbal instruction and written materials to support subject.  Instructor reviews cardiac drug classes: angiotensin converting enzyme inhibitors (ACE-I), angiotensin II receptor blockers (ARBs), nitrates, and calcium channel blockers.  Instructor discusses reasons, side effects, and lifestyle considerations for each drug class.   Anatomy and Physiology of the Circulatory System:  Group verbal and written instruction and models provide basic cardiac anatomy and physiology, with the coronary electrical and arterial systems. Review of: AMI, Angina, Valve disease, Heart Failure, Peripheral Artery Disease, Cardiac Arrhythmia, Pacemakers, and the ICD.   Other Education:  -Group or individual verbal, written, or video instructions that support the educational goals of the cardiac rehab program.   Holiday Eating Survival Tips:  -Group instruction provided by PowerPoint slides, verbal discussion, and written materials  to support subject matter. The instructor gives patients tips, tricks, and techniques to help them not only survive but enjoy the holidays despite the onslaught of food that accompanies the holidays.   Knowledge Questionnaire Score:  Knowledge Questionnaire Score - 09/20/20 1106       Knowledge Questionnaire Score   Pre Score 23/24             Core Components/Risk Factors/Patient Goals at Admission:  Personal Goals and Risk Factors at Admission - 09/20/20 1108       Core Components/Risk Factors/Patient Goals on Admission    Weight Management Yes;Weight Loss     Intervention Weight Management: Develop a combined nutrition and exercise program designed to reach desired caloric intake, while maintaining appropriate intake of nutrient and fiber, sodium and fats, and appropriate energy expenditure required for the weight goal.;Weight Management: Provide education and appropriate resources to help participant work on and attain dietary goals.;Weight Management/Obesity: Establish reasonable short term and long term weight goals.;Obesity: Provide education and appropriate resources to help participant work on and attain dietary goals.    Admit Weight 314 lb 6 oz (142.6 kg)    Expected Outcomes Short Term: Continue to assess and modify interventions until short term weight is achieved;Long Term: Adherence to nutrition and physical activity/exercise program aimed toward attainment of established weight goal;Weight Loss: Understanding of general recommendations for a balanced deficit meal plan, which promotes 1-2 lb weight loss per week and includes a negative energy balance of [phone] kcal/d;Understanding recommendations for meals to include 15-35% energy as protein, 25-35% energy from fat, 35-60% energy from carbohydrates, less than 252m of dietary cholesterol, 20-35 gm of total fiber daily;Understanding of distribution of calorie intake throughout the day with the consumption of 4-5 meals/snacks    Hypertension Yes    Intervention Provide education on lifestyle modifcations including regular physical activity/exercise, weight management, moderate sodium restriction and increased consumption of fresh fruit, vegetables, and low fat dairy, alcohol moderation, and smoking cessation.;Monitor prescription use compliance.    Expected Outcomes Short Term: Continued assessment and intervention until BP is < 140/945mHG in hypertensive participants. < 130/8019mG in hypertensive participants with diabetes, heart failure or chronic kidney disease.;Long Term: Maintenance of blood  pressure at goal levels.    Lipids Yes    Intervention Provide education and support for participant on nutrition & aerobic/resistive exercise along with prescribed medications to achieve LDL <19m74mDL >40mg51m Expected Outcomes Short Term: Participant states understanding of desired cholesterol values and is compliant with medications prescribed. Participant is following exercise prescription and nutrition guidelines.;Long Term: Cholesterol controlled with medications as prescribed, with individualized exercise RX and with personalized nutrition plan. Value goals: LDL < 19mg,15m > 40 mg.    Stress Yes    Intervention Offer individual and/or small group education and counseling on adjustment to heart disease, stress management and health-related lifestyle change. Teach and support self-help strategies.;Refer participants experiencing significant psychosocial distress to appropriate mental health specialists for further evaluation and treatment. When possible, include family members and significant others in education/counseling sessions.    Expected Outcomes Short Term: Participant demonstrates changes in health-related behavior, relaxation and other stress management skills, ability to obtain effective social support, and compliance with psychotropic medications if prescribed.;Long Term: Emotional wellbeing is indicated by absence of clinically significant psychosocial distress or social isolation.    Personal Goal Other Yes    Personal Goal Short term: Create a home exercise plan. Long term: Rollar skate, increase mobility, lose  wt    Intervention Will continue to monitor pt and progress workloads as tolerated without sign or symptom    Expected Outcomes Pt will achieve her goals             Core Components/Risk Factors/Patient Goals Review:   Goals and Risk Factor Review     Row Name 09/25/20 0759 10/16/20 1632 11/09/20 1820         Core Components/Risk Factors/Patient Goals Review    Personal Goals Review Weight Management/Obesity;Hypertension;Lipids;Stress Weight Management/Obesity;Hypertension;Lipids;Stress Weight Management/Obesity;Hypertension;Lipids;Stress     Review Min started exercise on 09/24/20 and did well with exercise for her fitness level.VSS [name] has been doing well with exercise for her fitness level. Vital signs have been stable. Average met level around 2.0 [name] has been doing well with exercise for her fitness level. Vital signs have been stable. [name] attendace has been sporadic due to work/ family obligations.     Expected Outcomes [name] will continue to participate in phase 2 cardiac rehab for exercise, nutrition and risk factor modification [name] will continue to participate in phase 2 cardiac rehab for exercise, nutrition and risk factor modification [name] will continue to participate in phase 2 cardiac rehab for exercise, nutrition and risk factor modification when in attendance. [name] is currently out due to work.              Core Components/Risk Factors/Patient Goals at Discharge (Final Review):   Goals and Risk Factor Review - 11/09/20 1820       Core Components/Risk Factors/Patient Goals Review   Personal Goals Review Weight Management/Obesity;Hypertension;Lipids;Stress    Review [name] has been doing well with exercise for her fitness level. Vital signs have been stable. [name] attendace has been sporadic due to work/ family obligations.    Expected Outcomes [name] will continue to participate in phase 2 cardiac rehab for exercise, nutrition and risk factor modification when in attendance. [name] is currently out due to work.             ITP Comments:  ITP Comments     Row Name 09/20/20 0959 09/25/20 0756 10/16/20 1626 11/09/20 1818     ITP Comments Dr Fransico Him MD, Medical Director 30 Day ITP Review. Lockie started exercise at cardiac rehab on 09/25/20 and did well with exercise for her fitness level 30 Day ITP Review. Viktoriya has good participation  in phase 2 cardiac rehab when in attendance. 30 Day ITP Review. Kayda has good participation in phase 2 cardiac rehab when in attendance. Ahtziry has been absent due to work and family obligations             Comments: See ITP comments.

## 2020-11-12 NOTE — Telephone Encounter (Signed)
Pt has been RS for 10/13

## 2020-11-14 ENCOUNTER — Telehealth (HOSPITAL_COMMUNITY): Payer: Self-pay | Admitting: Family Medicine

## 2020-11-14 ENCOUNTER — Encounter (HOSPITAL_COMMUNITY): Payer: 59

## 2020-11-15 ENCOUNTER — Ambulatory Visit (INDEPENDENT_AMBULATORY_CARE_PROVIDER_SITE_OTHER): Payer: 59

## 2020-11-15 ENCOUNTER — Other Ambulatory Visit: Payer: Self-pay

## 2020-11-15 DIAGNOSIS — Z7901 Long term (current) use of anticoagulants: Secondary | ICD-10-CM

## 2020-11-15 DIAGNOSIS — E538 Deficiency of other specified B group vitamins: Secondary | ICD-10-CM

## 2020-11-15 LAB — POCT INR: INR: 2.4 (ref 2.0–3.0)

## 2020-11-15 MED ORDER — CYANOCOBALAMIN 1000 MCG/ML IJ SOLN
1000.0000 ug | Freq: Once | INTRAMUSCULAR | Status: AC
  Administered 2020-11-15: 1000 ug via INTRAMUSCULAR

## 2020-11-15 NOTE — Patient Instructions (Addendum)
Pre visit review using our clinic review tool, if applicable. No additional management support is needed unless otherwise documented below in the visit note.  Increase dose today to 15 mg and then change weekly dose to take 1 1/2 tablets daily except take 1 tablet on Mon and Thurs.  Re-check in 4 weeks.

## 2020-11-15 NOTE — Progress Notes (Signed)
Per orders of Dr. Tower, injection of B12 in R deltoid given by Sohum Delillo. Patient tolerated injection well.  

## 2020-11-16 ENCOUNTER — Encounter (HOSPITAL_COMMUNITY): Payer: 59

## 2020-11-18 NOTE — Progress Notes (Signed)
Cardiology Office Note   Date:  11/19/2020   ID:  [name], DOB July 08, 1965, MRN [id number]  PCP:  Cheryl Greenspan, MD  Cardiologist:   [name], MD    Pt presents for f/u of AV dz     History of Present Illness: [name] is a 55 y.o. female with a history of HTN, HL, OSA, lupus,, Factor V Leiden, DVT/PE, L Lung empyema, intracerbral hemorrhage   She presented to [facility] in Dec 2021 for CP   Pain occurred at rest   ALso with increased SOB   Pt underwent L heart cath which showed normal coronary artieres.   Mean gradient across AV was 52 mm.  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  Post op had atrial fibrillation   Placed on amiodarone The pt was seen in f/u by [employer] n March.  Doing well   Echo in APril 2022 showed prosthesis functioning well   Mean gradient 17 mm Hg.  I last saw the pt in clinic this summer    Since then she hasa started cardiac rehab.   She is working   Says she is ablve to walk a lot at work without SOB or having to stop    Current Meds  Medication Sig   albuterol (VENTOLIN HFA) 108 (90 Base) MCG/ACT inhaler Inhale 1-2 puffs into the lungs every 6 (six) hours as needed for wheezing or shortness of breath.   amoxicillin (AMOXIL) 500 MG tablet Take 4 tabs (2000 mg) 30-60 min before dental procedures   aspirin EC 81 MG EC tablet Take 1 tablet (81 mg total) by mouth daily. Swallow whole.   bismuth subsalicylate (PEPTO BISMOL) 262 MG/15ML suspension Take 30 mLs by mouth every 6 (six) hours as needed for indigestion or diarrhea or loose stools.   calcium carbonate (TUMS - DOSED IN MG ELEMENTAL CALCIUM) 500 MG chewable tablet Chew 1-2 tablets by mouth 3 (three) times daily as needed for indigestion or heartburn.   cyanocobalamin  (,VITAMIN B-12,) 1000 MCG/ML injection Inject 1,000 mcg into the muscle every 30 (thirty) days.    ezetimibe (ZETIA) 10 MG tablet Take 1 tablet (10 mg total) by mouth daily.   hydroxychloroquine (PLAQUENIL) 200 MG tablet Take 200 mg by mouth 2 (two) times daily.   levothyroxine (SYNTHROID) 200 MCG tablet TAKE 1 TABLET (200 MCG TOTAL) BY MOUTH DAILY BEFORE BREAKFAST.   metoprolol tartrate (LOPRESSOR) 50 MG tablet Take 1 tablet (50 mg total) by mouth 2 (two) times daily.   Multiple Vitamins-Minerals (BARIATRIC MULTIVITAMINS/IRON) CAPS Take 1 tablet by mouth daily.   omeprazole (PRILOSEC) 20 MG capsule Take 1 capsule (20 mg total) by mouth 2 (two) times daily before a meal.   PARoxetine (PAXIL) 40 MG tablet TAKE 1 TABLET BY MOUTH EVERY DAY IN THE MORNING   rosuvastatin (CRESTOR) 40 MG tablet Take 1 tablet (40 mg total) by mouth daily.   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    Loculated-chronic on Left-VanTright; s/p VATS 5/10   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   GERD (gastroesophageal reflux disease)    on prilosec r/t gastric sleeve surgery   HLD (hyperlipidemia)    Iron deficiency anemia    IV dextran [place]   Nephritis and nephropathy, not specified as acute or chronic, with unspecified pathological lesion in kidney    Nonspecific abnormal results of liver function study    Obesity, unspecified    Other specified acquired hypothyroidism    Panic disorder without agoraphobia    Personal history of venous thrombosis and embolism 2002    during pregnancy; ?factor 5 leiden (sees heme)   Pleural effusion 2005   c/w lupus initial w/u; recurrent Right as pred tapered off July 2011   Sleep apnea    Systemic lupus erythematosus ([place])    renal GN, hx of pericardial eff in late 90's   Unspecified essential hypertension     Past Surgical History:  Procedure Laterality Date   AORTIC VALVE REPLACEMENT N/A 04/06/2020   Procedure: AORTIC VALVE REPLACEMENT (AVR) USING INSPIRIS 21 MM AORTIC VALVE;  Surgeon: Cheryl Pollack, MD;  Location: [facility];  Service: Open Heart Surgery;  Laterality: N/A;   BIOPSY  04/12/2019   Procedure: BIOPSY;  Surgeon: Thornton Park, MD;  Location: [facility];  Service: Gastroenterology;;   CARDIAC CATHETERIZATION     ESOPHAGEAL MANOMETRY N/A 07/15/2019   Procedure: ESOPHAGEAL MANOMETRY (EM);  Surgeon: Cheryl Pole, MD;  Location: [facility];  Service: Endoscopy;  Laterality: N/A;   ESOPHAGOGASTRODUODENOSCOPY (EGD) WITH PROPOFOL N/A 04/12/2019   Procedure: ESOPHAGOGASTRODUODENOSCOPY (EGD) WITH PROPOFOL;  Surgeon: Thornton Park, MD;  Location: [facility];  Service: Gastroenterology;  Laterality: N/A;   gastric sleeve  09/04/2010   bariatric surgery Dr Evorn Gong    LAPAROSCOPIC TUBAL LIGATION  12/09/2010   Procedure: LAPAROSCOPIC TUBAL LIGATION;  Surgeon: [name];  Location: [facility];  Service: Gynecology;  Laterality: Bilateral;  Attempted see Nursing note   pleurocentesis  10/04/2004   Pleuryx catheter placement  10/04/2009   VanTright----removed 9/11   RENAL BIOPSY  09/24/2012   RIGHT/LEFT HEART CATH AND CORONARY ANGIOGRAPHY N/A 01/19/2020   Procedure: RIGHT/LEFT HEART CATH AND CORONARY ANGIOGRAPHY;  Surgeon: Cheryl Harp, MD;  Location: [facility];  Service: Cardiovascular;  Laterality: N/A;   svd     x 3   TEE WITHOUT CARDIOVERSION N/A 04/06/2020   Procedure: TRANSESOPHAGEAL ECHOCARDIOGRAM (TEE);  Surgeon: Cheryl Pollack, MD;  Location: [facility];  Service: Open Heart  Surgery;  Laterality: N/A;   THORACENTESIS  02/04/2008   with penumonia   WISDOM TOOTH EXTRACTION       Social History:  The patient  reports that she quit smoking about 11 years ago. Her smoking use included cigarettes. She has a 5.00 pack-year smoking history. She has never used smokeless tobacco. She reports that she does not currently use alcohol. She reports that she does not use drugs.   Family History:  [name] family history includes Diabetes in an other family member; Hyperlipidemia in her father; Hypertension in her father; Leukemia in her sister; Lupus in her sister.    ROS:  Please see the history of present  illness. All other systems are reviewed and  Negative to the above problem except as noted.    PHYSICAL EXAM: VS:  BP 120/80 (BP Location: Left Arm, Patient Position: Sitting, Cuff Size: Normal)   Pulse 72   Ht _0  (1.651 m)   Wt (!) 321 lb (145.6 kg)   LMP 11/08/2010   SpO2 95%   BMI 53.42 kg/m   GEN: Morbidly obese 55 yo in no acute distress  HEENT: normal  Neck: no JVD, no carotid bruits Cardiac: RRR; Gr II/VI systolic murmur base   No LE  edema  Respiratory:  clear to auscultation bilaterally, normal work of breathing GI: soft, nontender, nondistended, + BS  No hepatomegaly  MS: no deformity Moving all extremities   Skin: warm and dry, no rash Neuro:  Strength and sensation are intact Psych: euthymic mood, full affect   EKG:  EKG is not ordered today.   Lipid Panel    Component Value Date/Time   CHOL 163 09/07/2020 0750   TRIG 79 09/07/2020 0750   HDL 57 09/07/2020 0750   CHOLHDL 2.9 09/07/2020 0750   CHOLHDL 4 01/04/2019 1615   VLDL 24.8 01/04/2019 1615   LDLCALC 91 09/07/2020 0750   LDLDIRECT 203.7 06/03/2011 1433      Wt Readings from Last 3 Encounters:  11/19/20 (!) 321 lb (145.6 kg)  10/23/20 (!) 318 lb 6 oz (144.4 kg)  09/27/20 (!) 314 lb 12.8 oz (142.8 kg)      ASSESSMENT AND PLAN:  1  AV dz   Pt is s/p AVR in March 2022  with 21 mm bioprosthesis.   Mean gradient 93m on echo earlier this year   DOIng good.    WIll continue to follow    SBE prophylaxis pror to dental work  2  Post op atrial fibrllation   No symptomatic recurrence     3  Hx Facto V Leiden   COntinue on anticoagulation    INR followed in clinic   Note has been difficult probably due to amio and post op  4  HTN  BP is good     FOllw  5  Hx SLE    Continue Plaquenil  6   HL  Keep on Crestor  7  Anemia  Follows in heme     Plan for f/u in the spring  Current medicines are reviewed at length with the patient today.  The patient does not have concerns regarding medicines.  Signed, PDorris Carnes MD  11/19/2020 8:32 PM    [email or web address] [place]  [id number]: [phone] Fax: [phone]

## 2020-11-19 ENCOUNTER — Ambulatory Visit (INDEPENDENT_AMBULATORY_CARE_PROVIDER_SITE_OTHER): Payer: 59 | Admitting: Internal Medicine

## 2020-11-19 ENCOUNTER — Encounter (HOSPITAL_COMMUNITY): Admission: RE | Admit: 2020-11-19 | Payer: 59 | Source: Ambulatory Visit

## 2020-11-19 ENCOUNTER — Other Ambulatory Visit: Payer: Self-pay

## 2020-11-19 VITALS — BP 120/80 | HR 72 | Ht 65.0 in | Wt 321.0 lb

## 2020-11-19 DIAGNOSIS — I359 Nonrheumatic aortic valve disorder, unspecified: Secondary | ICD-10-CM

## 2020-11-19 NOTE — Patient Instructions (Signed)
Medication Instructions:  °Your physician recommends that you continue on your current medications as directed. Please refer to the Current Medication list given to you today. ° °*If you need a refill on your cardiac medications before your next appointment, please call your pharmacy* ° ° °Lab Work: °none °If you have labs (blood work) drawn today and your tests are completely normal, you will receive your results only by: °MyChart Message (if you have MyChart) OR °A paper copy in the mail °If you have any lab test that is abnormal or we need to change your treatment, we will call you to review the results. ° ° °Testing/Procedures: °none ° ° °Follow-Up: °At [facility], you and your health needs are our priority.  As part of our continuing mission to provide you with exceptional heart care, we have created designated Provider Care Teams.  These Care Teams include your primary Cardiologist (physician) and Advanced Practice Providers (APPs -  Physician Assistants and Nurse Practitioners) who all work together to provide you with the care you need, when you need it. ° °We recommend signing up for the patient portal called "MyChart".  Sign up information is provided on this After Visit Summary.  MyChart is used to connect with patients for Virtual Visits (Telemedicine).  Patients are able to view lab/test results, encounter notes, upcoming appointments, etc.  Non-urgent messages can be sent to your provider as well.   °To learn more about what you can do with MyChart, go to [email or web address].   ° °Your next appointment:   °6 month(s) ° °The format for your next appointment:   °In Person ° °Provider:   °Paula Ross, MD   ° ° °Other Instructions °  °

## 2020-11-21 ENCOUNTER — Encounter (HOSPITAL_COMMUNITY): Payer: 59

## 2020-11-23 ENCOUNTER — Encounter (HOSPITAL_COMMUNITY): Payer: Self-pay | Admitting: *Deleted

## 2020-11-23 ENCOUNTER — Encounter (HOSPITAL_COMMUNITY): Payer: 59

## 2020-11-23 DIAGNOSIS — Z952 Presence of prosthetic heart valve: Secondary | ICD-10-CM

## 2020-11-23 NOTE — Progress Notes (Addendum)
Discharge Progress Report  Patient Details  Name: [name] MRN: [id number] Date of Birth: 02-19-1965 Referring Provider:   Flowsheet Row CARDIAC REHAB PHASE II ORIENTATION from 09/20/2020 in [place]  Referring Provider Dr. [name], MD        Number of Visits: 13  Reason for Discharge:  Early Exit:  Lack of attendance  Smoking History:  Social History   Tobacco Use  Smoking Status Former   Packs/day: 0.50   Years: 10.00   Pack years: 5.00   Types: Cigarettes   Quit date: 05/04/2009   Years since quitting: 11.5  Smokeless Tobacco Never  Tobacco Comments   Socially x10 years (1 pack/month)    Diagnosis:  04/06/20 S/P AVR (aortic valve replacement)  ADL UCSD:   Initial Exercise Prescription:   Discharge Exercise Prescription (Final Exercise Prescription Changes):  Exercise Prescription Changes - 11/12/20 1638       Response to Exercise   Blood Pressure (Admit) 110/64    Blood Pressure (Exercise) 120/64    Blood Pressure (Exit) 120/68    Heart Rate (Admit) 83 bpm    Heart Rate (Exercise) 80 bpm    Heart Rate (Exit) 63 bpm    Rating of Perceived Exertion (Exercise) 12    Perceived Dyspnea (Exercise) 0    Symptoms 0    Comments Reviewed MET's and goals    Duration Progress to 30 minutes of  aerobic without signs/symptoms of physical distress    Intensity THRR unchanged      Progression   Progression Continue to progress workloads to maintain intensity without signs/symptoms of physical distress.    Average METs 2      Resistance Training   Training Prescription Yes    Weight 3    Reps 10-15    Time 10 Minutes      T5 Nustep   Level 2    SPM 70    Minutes 20    METs 2      Home Exercise Plan   Plans to continue exercise at Home (comment)    Frequency Add 3 additional days to program exercise sessions.    Initial Home Exercises Provided 10/12/20             Functional Capacity:   Psychological, QOL,  Others - Outcomes: PHQ 2/9: Depression screen [facility] 2/9 09/20/2020 01/04/2019 11/16/2017 10/03/2016  Decreased Interest 0 0 1 0  Down, Depressed, Hopeless 0 0 0 0  PHQ - 2 Score 0 0 1 0  Altered sleeping - 1 - 0  Tired, decreased energy - 1 - 2  Change in appetite - 1 - 1  Feeling bad or failure about yourself  - 1 - 0  Trouble concentrating - 0 - 0  Moving slowly or fidgety/restless - 0 - 0  Suicidal thoughts - 0 - 0  PHQ-9 Score - 4 - 3  Some recent data might be hidden    Quality of Life:   Personal Goals: Goals established at orientation with interventions provided to work toward goal.    Personal Goals Discharge:  Goals and Risk Factor Review     Row Name 09/25/20 0759 10/16/20 1632 11/09/20 1820         Core Components/Risk Factors/Patient Goals Review   Personal Goals Review Weight Management/Obesity;Hypertension;Lipids;Stress Weight Management/Obesity;Hypertension;Lipids;Stress Weight Management/Obesity;Hypertension;Lipids;Stress     Review [name] started exercise on 09/24/20 and did well with exercise for her fitness level.VSS [name] has been doing well  with exercise for her fitness level. Vital signs have been stable. Average met level around 2.0 [name] has been doing well with exercise for her fitness level. Vital signs have been stable. [name] attendace has been sporadic due to work/ family obligations.     Expected Outcomes [name] will continue to participate in phase 2 cardiac rehab for exercise, nutrition and risk factor modification [name] will continue to participate in phase 2 cardiac rehab for exercise, nutrition and risk factor modification [name] will continue to participate in phase 2 cardiac rehab for exercise, nutrition and risk factor modification when in attendance. [name] is currently out due to work.              Exercise Goals and Review:   Exercise Goals Re-Evaluation:  Exercise Goals Re-Evaluation     Row Name 09/24/20 1627 10/12/20 1633 11/12/20 1640          Exercise Goal Re-Evaluation   Exercise Goals Review Increase Physical Activity;Able to understand and use rate of perceived exertion (RPE) scale;Increase Strength and Stamina;Knowledge and understanding of Target Heart Rate Range (THRR);Understanding of Exercise Prescription Increase Physical Activity;Able to understand and use rate of perceived exertion (RPE) scale;Increase Strength and Stamina;Knowledge and understanding of Target Heart Rate Range (THRR);Understanding of Exercise Prescription Increase Physical Activity;Able to understand and use rate of perceived exertion (RPE) scale;Increase Strength and Stamina;Knowledge and understanding of Target Heart Rate Range (THRR);Understanding of Exercise Prescription     Comments Pt first day in the CRP2 program. Pt tolerated exercise well. Pt understands RPE, THRR and exercise Rx. Reviewed MET's goals and home Ex Rx. Pt is tolerating exercise well with an average MET level of 2.0. Pt states that she can tell the program is helping her gain more mobility. Her ADL's have become easier since starting and she is able to do more things she loves like holding and rocking her grandchild for longer periods of time. Another goals was to create a home exercise program, which we did today. She will exercise on her recumbant bike 3 additional days for 15-30 minutes per session. Reviewed MET's and goals. Pt is tolerating exercise well with an average MET level of 2.0. Pt has been absent, but is eagerly starting back today. Pt can tell her ADL's are getting easier. She is having to take less breaks on her walks and her mobilty has increased. She is still working on adding in home exercise, she says she knows what to do, she just needs to start. Offered encouragment.     Expected Outcomes Will continue to monitor pt and progress workloads as tolerated without sign or symptom. Pt will add in 3 days of exercise on her recumbant bike for 15-30 minutes. Will continue to monitor pt  and progress workloads as tolerated without sign or symptom. Will continue to monitor pt and progress workloads as tolerated without sign or symptom.              Nutrition & Weight - Outcomes:    Nutrition:  Nutrition Therapy & Goals - 10/01/20 1100       Nutrition Therapy   Diet TLC    Drug/Food Interactions Statins/Certain Fruits;Coumadin/Vit K      Personal Nutrition Goals   Nutrition Goal Pt to identify food quantities necessary to achieve weight loss of 6-24 lb at graduation from cardiac rehab.    Personal Goal #2 Pt to build a healthy plate including vegetables, fruits, whole grains, and low-fat dairy products in a heart healthy meal plan.  Personal Goal #3 Pt to exercise at home      Intervention Plan   Intervention Prescribe, educate and counsel regarding individualized specific dietary modifications aiming towards targeted core components such as weight, hypertension, lipid management, diabetes, heart failure and other comorbidities.;Nutrition handout(s) given to patient.    Expected Outcomes Short Term Goal: A plan has been developed with personal nutrition goals set during dietitian appointment.;Long Term Goal: Adherence to prescribed nutrition plan.             Nutrition Discharge:   Education Questionnaire Score:   Pt  completed of 13 exercise sessions in Phase II between 09/24/20- 11/12/20 . Pt maintained fair to poor attendance and progressed nicely during here participation in rehab as evidenced by increased MET level. Lacrisha did not come on a consistent basis due to work schedule and child care as Floy took care of her grand child. Stuti's averge mets were 2.0. We hope that Kennadi will continue exercise on her own.Barnet Pall, RN,BSN 11/30/2020 12:26 PM

## 2020-12-13 ENCOUNTER — Ambulatory Visit: Payer: 59

## 2020-12-13 ENCOUNTER — Telehealth: Payer: Self-pay

## 2020-12-13 NOTE — Telephone Encounter (Signed)
Pt NS coumadin clinic apt today. LVM for pt to return call to RS apt.

## 2020-12-17 NOTE — Telephone Encounter (Signed)
Pt LVM to RS coumadin clinic apt.

## 2020-12-18 ENCOUNTER — Other Ambulatory Visit: Payer: Self-pay

## 2020-12-18 ENCOUNTER — Ambulatory Visit (INDEPENDENT_AMBULATORY_CARE_PROVIDER_SITE_OTHER): Payer: 59

## 2020-12-18 DIAGNOSIS — Z7901 Long term (current) use of anticoagulants: Secondary | ICD-10-CM

## 2020-12-18 DIAGNOSIS — E538 Deficiency of other specified B group vitamins: Secondary | ICD-10-CM

## 2020-12-18 LAB — POCT INR: INR: 3.7 — AB (ref 2.0–3.0)

## 2020-12-18 MED ORDER — CYANOCOBALAMIN 1000 MCG/ML IJ SOLN
1000.0000 ug | Freq: Once | INTRAMUSCULAR | Status: AC
  Administered 2020-12-18: 1000 ug via INTRAMUSCULAR

## 2020-12-18 NOTE — Patient Instructions (Addendum)
Pre visit review using our clinic review tool, if applicable. No additional management support is needed unless otherwise documented below in the visit note.  Decrease dose today to 1/2 tablet and then continue 1 1/2 tablets daily except take 1 tablet on Mon and Thurs.  Re-check in 4 weeks.

## 2020-12-18 NOTE — Progress Notes (Signed)
Patient ID: [name], female   DOB: 01/10/1966, 55 y.o.   MRN: [id number] ° °Medical screening examination/treatment/procedure(s) were performed by non-physician practitioner and as supervising physician I was immediately available for consultation/collaboration.  I agree with above. Yessica Putnam, MD ° °

## 2020-12-18 NOTE — Progress Notes (Signed)
Decrease dose today to 1/2 tablet and then continue 1 1/2 tablets daily except take 1 tablet on Mon and Thurs.  Re-check in 4 weeks.   Pt requested B12 injection. Administered B12 inj in R deltoid. Pt tolerated well.   Per orders of Dr. Jonny Ruiz, injection of B12 given in R deltoid by Sherrie George. Patient tolerated injection well.

## 2020-12-21 ENCOUNTER — Other Ambulatory Visit: Payer: 59

## 2021-01-08 ENCOUNTER — Other Ambulatory Visit: Payer: Self-pay | Admitting: Physician Assistant

## 2021-01-08 ENCOUNTER — Telehealth: Payer: Self-pay

## 2021-01-08 DIAGNOSIS — Z9884 Bariatric surgery status: Secondary | ICD-10-CM | POA: Insufficient documentation

## 2021-01-08 NOTE — Telephone Encounter (Signed)
Pt called requesting apt for 12/8 due to apt for dental apt on 12/14. She wants to make sure her INR is back in range since she was hypertherapeutic the last apt. Dentist wants her INR 2.5 or less for the dental implant, so she will also keep her apt on 12/13 for INR check. Scheduled pt for 12/8. Pt appreciative.

## 2021-01-10 ENCOUNTER — Ambulatory Visit (INDEPENDENT_AMBULATORY_CARE_PROVIDER_SITE_OTHER): Payer: 59

## 2021-01-10 ENCOUNTER — Other Ambulatory Visit: Payer: Self-pay

## 2021-01-10 DIAGNOSIS — Z7901 Long term (current) use of anticoagulants: Secondary | ICD-10-CM

## 2021-01-10 LAB — POCT INR: INR: 3 (ref 2.0–3.0)

## 2021-01-10 NOTE — Patient Instructions (Addendum)
Pre visit review using our clinic review tool, if applicable. No additional management support is needed unless otherwise documented below in the visit note.  Continue 1 1/2 tablets daily except take 1 tablet on Mon and Thurs, except on Mon, 12/12, only take 1/2 tablet.  Do not take any coumadin on 12/13 until INR re-check at 2:00pm. Follow dentist instructions for restarting coumadin. Contact me for further dosing instructions after restarting coumadin after the procedure. Number is [phone].

## 2021-01-10 NOTE — Progress Notes (Signed)
Continue 1 1/2 tablets daily except take 1 tablet on Mon and Thurs, except on Mon 12/12 only take 1/2 tablet.  Do not take any coumadin on 12/13 until re-check at 2:00pm. Follow dentist instructions for restarting coumadin. Contact me for further dosing instructions after restarting coumadin. Number is [phone].  Will increase dose after procedure if bleeding is controlled. Pt will call for instructions.

## 2021-01-15 ENCOUNTER — Other Ambulatory Visit: Payer: Self-pay

## 2021-01-15 ENCOUNTER — Ambulatory Visit (INDEPENDENT_AMBULATORY_CARE_PROVIDER_SITE_OTHER): Payer: 59

## 2021-01-15 DIAGNOSIS — Z7901 Long term (current) use of anticoagulants: Secondary | ICD-10-CM | POA: Diagnosis not present

## 2021-01-15 DIAGNOSIS — E538 Deficiency of other specified B group vitamins: Secondary | ICD-10-CM

## 2021-01-15 LAB — POCT INR: INR: 2.3 (ref 2.0–3.0)

## 2021-01-15 MED ORDER — CYANOCOBALAMIN 1000 MCG/ML IJ SOLN
1000.0000 ug | Freq: Once | INTRAMUSCULAR | Status: AC
  Administered 2021-01-15: 1000 ug via INTRAMUSCULAR

## 2021-01-15 NOTE — Progress Notes (Addendum)
Hold dose today and restart warfarin tomorrow if advised by dentists. On 12/15 take 1 1/2 tablets and on 12/16 take 2 tablets and then continue 1 1/2 tablets daily except take 1 tablet on Mon and Thurs. Recheck 5 wks.   Per orders of Dr. Jonny Ruiz, injection of B12 given in R deltoid by Sherrie George. Patient tolerated injection well.

## 2021-01-15 NOTE — Progress Notes (Signed)
Patient ID: [name], female   DOB: 02/08/1965, 55 y.o.   MRN: [id number]  Medical screening examination/treatment/procedure(s) were performed by non-physician practitioner and as supervising physician I was immediately available for consultation/collaboration.  I agree with above. Oliver Barre, MD

## 2021-01-15 NOTE — Patient Instructions (Addendum)
Pre visit review using our clinic review tool, if applicable. No additional management support is needed unless otherwise documented below in the visit note.  Hold dose today and restart warfarin tomorrow if advised by dentists. On 12/15 take 1 1/2 tablets and on 12/16 take 2 tablets and then continue 1 1/2 tablets daily except take 1 tablet on Mon and Thurs. Recheck 5 wks.

## 2021-02-09 ENCOUNTER — Other Ambulatory Visit: Payer: Self-pay | Admitting: Family Medicine

## 2021-02-19 ENCOUNTER — Ambulatory Visit (INDEPENDENT_AMBULATORY_CARE_PROVIDER_SITE_OTHER): Payer: 59

## 2021-02-19 ENCOUNTER — Other Ambulatory Visit: Payer: Self-pay

## 2021-02-19 DIAGNOSIS — Z7901 Long term (current) use of anticoagulants: Secondary | ICD-10-CM

## 2021-02-19 DIAGNOSIS — E538 Deficiency of other specified B group vitamins: Secondary | ICD-10-CM

## 2021-02-19 LAB — POCT INR: INR: 2.5 (ref 2.0–3.0)

## 2021-02-19 MED ORDER — CYANOCOBALAMIN 1000 MCG/ML IJ SOLN
1000.0000 ug | Freq: Once | INTRAMUSCULAR | Status: AC
  Administered 2021-02-19: 1000 ug via INTRAMUSCULAR

## 2021-02-19 NOTE — Progress Notes (Addendum)
Continue 1 1/2 tablets daily except take 1 tablet on Monday and Wednesday. Recheck in 5 weeks.   Per orders of Dr. Jonny Ruiz, injection of B12 given in R deltoid by Sherrie George. Patient tolerated injection well.

## 2021-02-19 NOTE — Patient Instructions (Addendum)
Pre visit review using our clinic review tool, if applicable. No additional management support is needed unless otherwise documented below in the visit note.  Continue 1 1/2 tablets daily except take 1 tablet on Monday and Wednesday. Recheck in 5 weeks.

## 2021-02-19 NOTE — Progress Notes (Signed)
Patient ID: [name], female   DOB: 02/08/1965, 56 y.o.   MRN: [id number]  Medical screening examination/treatment/procedure(s) were performed by non-physician practitioner and as supervising physician I was immediately available for consultation/collaboration.  I agree with above. Oliver Barre, MD

## 2021-02-20 ENCOUNTER — Other Ambulatory Visit: Payer: Self-pay | Admitting: Family Medicine

## 2021-02-20 DIAGNOSIS — Z7901 Long term (current) use of anticoagulants: Secondary | ICD-10-CM

## 2021-02-20 NOTE — Telephone Encounter (Signed)
Pt is compliant with coumadin management and PCP apts. Sent in refill.  

## 2021-03-15 ENCOUNTER — Telehealth: Payer: Self-pay | Admitting: Family Medicine

## 2021-03-15 DIAGNOSIS — Z029 Encounter for administrative examinations, unspecified: Secondary | ICD-10-CM

## 2021-03-15 NOTE — Telephone Encounter (Signed)
Type of forms received: FMLA  Routed to: (placed in [place]'s box for pick-up  Paperwork received by :  Amy  Patient is requesting the [employer] forms be faxed and give her a call so that she can pick-up forms at [phone]  Patient informed of 5-7 business day turnaround, forms were originally due today, but she was provided a 10 day extension since she just received them.

## 2021-03-18 NOTE — Telephone Encounter (Signed)
I have reviewed her paperwork and it looks like she may need several different sets of FMLA papers  One for lupus related conditions One for cardiac conditions One for anemia/iron deficiency   These would need to be done by each specialist who covers that set of problems   Then I would do one for the factor V/ coumadin clinic since she does that with [place]   I can make blank copies and then fill out mine for the once monthly (average) coumadin clinic. Please ask her how much work she usually misses per coumadin clinic visit, thanks

## 2021-03-26 ENCOUNTER — Ambulatory Visit (INDEPENDENT_AMBULATORY_CARE_PROVIDER_SITE_OTHER): Payer: 59

## 2021-03-26 ENCOUNTER — Other Ambulatory Visit: Payer: Self-pay

## 2021-03-26 DIAGNOSIS — E538 Deficiency of other specified B group vitamins: Secondary | ICD-10-CM

## 2021-03-26 DIAGNOSIS — Z7901 Long term (current) use of anticoagulants: Secondary | ICD-10-CM | POA: Diagnosis not present

## 2021-03-26 LAB — POCT INR: INR: 2.3 (ref 2.0–3.0)

## 2021-03-26 MED ORDER — CYANOCOBALAMIN 1000 MCG/ML IJ SOLN
1000.0000 ug | Freq: Once | INTRAMUSCULAR | Status: AC
  Administered 2021-03-26: 1000 ug via INTRAMUSCULAR

## 2021-03-26 NOTE — Progress Notes (Addendum)
Increase dose today to take 2 1/2 tablets and then continue 1 1/2 tablets daily except take 1 tablet on Monday and Wednesday. Recheck in 5 weeks.   Per orders of Dr. Jonny Ruiz, injection of B12 given in R deltoid per pt request, given by Sherrie George. Patient tolerated injection well.

## 2021-03-26 NOTE — Telephone Encounter (Signed)
Left VM requesting pt to call the office back 

## 2021-03-26 NOTE — Progress Notes (Signed)
Patient ID: [name], female   DOB: 02/08/1965, 56 y.o.   MRN: [id number]  Medical screening examination/treatment/procedure(s) were performed by non-physician practitioner and as supervising physician I was immediately available for consultation/collaboration.  I agree with above. Oliver Barre, MD

## 2021-03-26 NOTE — Patient Instructions (Addendum)
Pre visit review using our clinic review tool, if applicable. No additional management support is needed unless otherwise documented below in the visit note.  Increase dose today to take 2 1/2 tablets and then continue 1 1/2 tablets daily except take 1 tablet on Monday and Wednesday. Recheck in 5 weeks.

## 2021-03-28 NOTE — Telephone Encounter (Signed)
Pt returning your call. Pt is asking has Dr Milinda Antis completed her FMLA papers. Please advise.

## 2021-03-29 NOTE — Telephone Encounter (Signed)
Mrs. [name] calling back returning shapale phone call

## 2021-04-01 NOTE — Telephone Encounter (Signed)
Patient notified as instructed by telephone and verbalized understanding. Patient stated that she will get the specialist to fill out what they need to. Patient stated that she would probably miss about 30 minutes every month for coumadin check.

## 2021-04-02 ENCOUNTER — Inpatient Hospital Stay: Payer: 59 | Attending: Hematology and Oncology | Admitting: Hematology and Oncology

## 2021-04-02 ENCOUNTER — Encounter: Payer: Self-pay | Admitting: Hematology and Oncology

## 2021-04-02 ENCOUNTER — Inpatient Hospital Stay: Payer: 59

## 2021-04-02 ENCOUNTER — Other Ambulatory Visit: Payer: Self-pay

## 2021-04-02 VITALS — BP 140/77 | HR 65 | Temp 97.3°F | Resp 18 | Wt 327.0 lb

## 2021-04-02 DIAGNOSIS — D6851 Activated protein C resistance: Secondary | ICD-10-CM | POA: Insufficient documentation

## 2021-04-02 DIAGNOSIS — D509 Iron deficiency anemia, unspecified: Secondary | ICD-10-CM | POA: Diagnosis not present

## 2021-04-02 DIAGNOSIS — R0602 Shortness of breath: Secondary | ICD-10-CM | POA: Diagnosis not present

## 2021-04-02 DIAGNOSIS — G4730 Sleep apnea, unspecified: Secondary | ICD-10-CM | POA: Diagnosis not present

## 2021-04-02 DIAGNOSIS — Z806 Family history of leukemia: Secondary | ICD-10-CM | POA: Diagnosis not present

## 2021-04-02 DIAGNOSIS — Z87891 Personal history of nicotine dependence: Secondary | ICD-10-CM | POA: Insufficient documentation

## 2021-04-02 DIAGNOSIS — Z7901 Long term (current) use of anticoagulants: Secondary | ICD-10-CM | POA: Diagnosis not present

## 2021-04-02 DIAGNOSIS — K219 Gastro-esophageal reflux disease without esophagitis: Secondary | ICD-10-CM | POA: Diagnosis not present

## 2021-04-02 DIAGNOSIS — Z79899 Other long term (current) drug therapy: Secondary | ICD-10-CM | POA: Insufficient documentation

## 2021-04-02 DIAGNOSIS — Z0279 Encounter for issue of other medical certificate: Secondary | ICD-10-CM

## 2021-04-02 DIAGNOSIS — I350 Nonrheumatic aortic (valve) stenosis: Secondary | ICD-10-CM | POA: Insufficient documentation

## 2021-04-02 DIAGNOSIS — Z86718 Personal history of other venous thrombosis and embolism: Secondary | ICD-10-CM | POA: Insufficient documentation

## 2021-04-02 DIAGNOSIS — I10 Essential (primary) hypertension: Secondary | ICD-10-CM | POA: Insufficient documentation

## 2021-04-02 DIAGNOSIS — Z7982 Long term (current) use of aspirin: Secondary | ICD-10-CM | POA: Diagnosis not present

## 2021-04-02 DIAGNOSIS — M129 Arthropathy, unspecified: Secondary | ICD-10-CM | POA: Insufficient documentation

## 2021-04-02 DIAGNOSIS — E785 Hyperlipidemia, unspecified: Secondary | ICD-10-CM | POA: Diagnosis not present

## 2021-04-02 LAB — CBC WITH DIFFERENTIAL/PLATELET
Abs Immature Granulocytes: 0.01 10*3/uL (ref 0.00–0.07)
Basophils Absolute: 0 10*3/uL (ref 0.0–0.1)
Basophils Relative: 1 %
Eosinophils Absolute: 0.4 10*3/uL (ref 0.0–0.5)
Eosinophils Relative: 8 %
HCT: 39.7 % (ref 36.0–46.0)
Hemoglobin: 12.3 g/dL (ref 12.0–15.0)
Immature Granulocytes: 0 %
Lymphocytes Relative: 27 %
Lymphs Abs: 1.3 10*3/uL (ref 0.7–4.0)
MCH: 25.8 pg — ABNORMAL LOW (ref 26.0–34.0)
MCHC: 31 g/dL (ref 30.0–36.0)
MCV: 83.2 fL (ref 80.0–100.0)
Monocytes Absolute: 0.4 10*3/uL (ref 0.1–1.0)
Monocytes Relative: 8 %
Neutro Abs: 2.6 10*3/uL (ref 1.7–7.7)
Neutrophils Relative %: 56 %
Platelets: 206 10*3/uL (ref 150–400)
RBC: 4.77 MIL/uL (ref 3.87–5.11)
RDW: 14.4 % (ref 11.5–15.5)
WBC: 4.7 10*3/uL (ref 4.0–10.5)
nRBC: 0 % (ref 0.0–0.2)

## 2021-04-02 LAB — IRON AND IRON BINDING CAPACITY (CC-WL,HP ONLY)
Iron: 54 ug/dL (ref 28–170)
Saturation Ratios: 14 % (ref 10.4–31.8)
TIBC: 400 ug/dL (ref 250–450)
UIBC: 346 ug/dL (ref 148–442)

## 2021-04-02 LAB — FERRITIN: Ferritin: 82 ng/mL (ref 11–307)

## 2021-04-02 NOTE — Telephone Encounter (Signed)
Done and in IN box Please add coumadin visit dates

## 2021-04-02 NOTE — Assessment & Plan Note (Addendum)
Continue Coumadin indefinitely.  We have discussed about testing family members during her last visit.  She should avoid any form of hormone replacement therapy.

## 2021-04-02 NOTE — Progress Notes (Signed)
[facility] CONSULT NOTE  Patient Care Team: Tower, [name], MD as PCP - General [name], MD as PCP - Cardiology (Cardiology) [name], MD (Pulmonary Disease) [name], MD as Consulting Physician (Oncology) [name], MD as Consulting Physician (Nephrology) [name], MD as Consulting Physician (Rheumatology)  CHIEF COMPLAINTS/PURPOSE OF CONSULTATION:  Iron def anemia, follow up after iron infusion  ASSESSMENT & PLAN:   IDA (iron deficiency anemia) This is a very pleasant 56 year old female patient with past medical history significant for iron deficiency anemia status post iron infusion who is here for follow-up lab work and additional recommendations. Since her last iron infusion, she felt quite well, pica has resolved instantly.  Shortness of breath significantly improved. Physical examination today unremarkable. We will repeat labs today. If she continues to have iron deficiency anemia, we will try to repeat intravenous iron again.  Most likely her IDA secondary to malabsorption because of gastric sleeve surgery in the past.  Homozygous Factor V Leiden mutation ([place]) Continue Coumadin indefinitely.  We have discussed about testing family members during her last visit.  She should avoid any form of hormone replacement therapy. Age-appropriate cancer screening advised.  She is up-to-date  Orders Placed This Encounter  Procedures   CBC with Differential/Platelet    Standing Status:   Standing    Number of Occurrences:   22    Standing Expiration Date:   04/02/2022   Iron and Iron Binding Capacity ([facility] only)    Standing Status:   Future    Standing Expiration Date:   04/02/2022   Ferritin    Standing Status:   Future    Standing Expiration Date:   04/02/2022      HISTORY OF PRESENTING ILLNESS:   [name] 56 y.o. female is here because of IDA.  This is a very pleasant 55 yr old female patient with iron  deficiency anemia, s.p last iron infusion about 3 yrs ago, multiple DVT/PE and homozygous factor V leiden mutation, aortic stenosis status post aortic valve replacement, SLE, obesity referred to hematology for evaluation of anemia as well as given her history of homozygous factor V Leiden mutation.    She is here for follow-up.  After her iron infusion in August 2022, she felt quite well.  Her pica instantly resolved.  Her energy was better.  She is now starting to chew ice again, feels more tired.  Some shortness of breath with exertion but not overt.  No change in bowel habits or urinary habits.  She has been taking Coumadin as recommended.  No issues.  INR therapeutic. Rest of the pertinent 10 point ROS reviewed and negative. 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    Loculated-chronic on Left-VanTright; s/p VATS 5/10   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   GERD (gastroesophageal reflux disease)    on prilosec r/t gastric sleeve surgery   HLD (hyperlipidemia)    Iron deficiency anemia    IV dextran [place]   Nephritis and nephropathy, not specified as acute or chronic, with unspecified pathological lesion in kidney    Nonspecific abnormal results of liver function study  Obesity, unspecified    Other specified acquired hypothyroidism    Panic disorder without agoraphobia    Personal history of venous thrombosis and embolism 2002   during pregnancy; ?factor 5 leiden (sees heme)   Pleural effusion 2005   c/w lupus initial w/u; recurrent Right as pred tapered off July 2011   Sleep apnea    Systemic lupus erythematosus ([place])    renal GN, hx of pericardial eff in late 90's   Unspecified essential hypertension     SURGICAL HISTORY: Past  Surgical History:  Procedure Laterality Date   AORTIC VALVE REPLACEMENT N/A 04/06/2020   Procedure: AORTIC VALVE REPLACEMENT (AVR) USING INSPIRIS 21 MM AORTIC VALVE;  Surgeon: [name], MD;  Location: [facility];  Service: Open Heart Surgery;  Laterality: N/A;   BIOPSY  04/12/2019   Procedure: BIOPSY;  Surgeon: Thornton Park, MD;  Location: [facility];  Service: Gastroenterology;;   CARDIAC CATHETERIZATION     ESOPHAGEAL MANOMETRY N/A 07/15/2019   Procedure: ESOPHAGEAL MANOMETRY (EM);  Surgeon: Mauri Pole, MD;  Location: [facility];  Service: Endoscopy;  Laterality: N/A;   ESOPHAGOGASTRODUODENOSCOPY (EGD) WITH PROPOFOL N/A 04/12/2019   Procedure: ESOPHAGOGASTRODUODENOSCOPY (EGD) WITH PROPOFOL;  Surgeon: Thornton Park, MD;  Location[facility];  Service: Gastroenterology;  Laterality: N/A;   gastric sleeve  09/04/2010   bariatric surgery Dr Evorn Gong    LAPAROSCOPIC TUBAL LIGATION  12/09/2010   Procedure: LAPAROSCOPIC TUBAL LIGATION;  Surgeon: Darlyn Chamber;  Location: [facility];  Service: Gynecology;  Laterality: Bilateral;  Attempted see Nursing note   pleurocentesis  10/04/2004   Pleuryx catheter placement  10/04/2009   VanTright----removed 9/11   RENAL BIOPSY  09/24/2012   RIGHT/LEFT HEART CATH AND CORONARY ANGIOGRAPHY N/A 01/19/2020   Procedure: RIGHT/LEFT HEART CATH AND CORONARY ANGIOGRAPHY;  Surgeon: Lorretta Harp, MD;  Location: [facility];  Service: Cardiovascular;  Laterality: N/A;   svd     x 3   TEE WITHOUT CARDIOVERSION N/A 04/06/2020   Procedure: TRANSESOPHAGEAL ECHOCARDIOGRAM (TEE);  Surgeon: [name], MD;  Location: [facility];  Service: Open Heart Surgery;  Laterality: N/A;   THORACENTESIS  02/04/2008   with penumonia   WISDOM TOOTH EXTRACTION      SOCIAL HISTORY: Social History   Socioeconomic History   Marital status: Divorced    Spouse name: Not on file   Number of children: 3   Years of education: 12   Highest education  level: Some college, no degree  Occupational History   Occupation: Clinical research associate at [employer] Use   Smoking status: Former    Packs/day: 0.50    Years: 10.00    Pack years: 5.00    Types: Cigarettes    Quit date: 05/04/2009    Years since quitting: 11.9   Smokeless tobacco: Never   Tobacco comments:    Socially x10 years (1 pack/month)  Vaping Use   Vaping Use: Never used  Substance and Sexual Activity   Alcohol use: Not Currently    Alcohol/week: 0.0 standard drinks    Comment: rare   Drug use: No   Sexual activity: Not on file  Other Topics Concern   Not on file  Social History Narrative   Not on file   Social Determinants of Health   Financial Resource Strain: Not on file  Food Insecurity: Not on file  Transportation Needs: Not on file  Physical Activity: Not on file  Stress: Not on file  Social Connections: Not on  file  Intimate Partner Violence: Not on file    FAMILY HISTORY: Family History  Problem Relation Age of Onset   Lupus Sister    Hypertension Father    Hyperlipidemia Father    Leukemia Sister    Diabetes Other        GM   Colon cancer Neg Hx    Esophageal cancer Neg Hx    Pancreatic cancer Neg Hx     ALLERGIES:  is allergic to lisinopril, lovenox [enoxaparin sodium], moxifloxacin, and lovenox [enoxaparin].  MEDICATIONS:  Current Outpatient Medications  Medication Sig Dispense Refill   albuterol (VENTOLIN HFA) 108 (90 Base) MCG/ACT inhaler Inhale 1-2 puffs into the lungs every 6 (six) hours as needed for wheezing or shortness of breath. 18 g 0   amoxicillin (AMOXIL) 500 MG tablet TAKE 4 TABS (2000 MG) 30-60 MIN BEFORE DENTAL PROCEDURES 4 tablet 1   aspirin EC 81 MG EC tablet Take 1 tablet (81 mg total) by mouth daily. Swallow whole. 30 tablet 11   bismuth subsalicylate (PEPTO BISMOL) 262 MG/15ML suspension Take 30 mLs by mouth every 6 (six) hours as needed for indigestion or diarrhea or loose stools.     calcium carbonate (TUMS - DOSED  IN MG ELEMENTAL CALCIUM) 500 MG chewable tablet Chew 1-2 tablets by mouth 3 (three) times daily as needed for indigestion or heartburn.     cyanocobalamin (,VITAMIN B-12,) 1000 MCG/ML injection Inject 1,000 mcg into the muscle every 30 (thirty) days.      ezetimibe (ZETIA) 10 MG tablet Take 1 tablet (10 mg total) by mouth daily. 90 tablet 3   hydroxychloroquine (PLAQUENIL) 200 MG tablet Take 200 mg by mouth 2 (two) times daily.     levothyroxine (SYNTHROID) 200 MCG tablet TAKE 1 TABLET (200 MCG TOTAL) BY MOUTH DAILY BEFORE BREAKFAST. 90 tablet 1   metoprolol tartrate (LOPRESSOR) 50 MG tablet Take 1 tablet (50 mg total) by mouth 2 (two) times daily. 180 tablet 3   Multiple Vitamins-Minerals (BARIATRIC MULTIVITAMINS/IRON) CAPS Take 1 tablet by mouth daily.     omeprazole (PRILOSEC) 20 MG capsule Take 1 capsule (20 mg total) by mouth 2 (two) times daily before a meal. 180 capsule 1   PARoxetine (PAXIL) 40 MG tablet TAKE 1 TABLET BY MOUTH EVERY DAY IN THE MORNING 90 tablet 1   rosuvastatin (CRESTOR) 40 MG tablet Take 1 tablet (40 mg total) by mouth daily. 90 tablet 3   warfarin (COUMADIN) 7.5 MG tablet TAKE 1&1/2 TABLETS BY MOUTH DAILY EXCEPT TAKE 1 TABLET ON MONDAYS AND THURSDAYS OR AS DIRECTED BY ANTICOAGULATION CLINIC 135 tablet 1   No current facility-administered medications for this visit.    PHYSICAL EXAMINATION:  ECOG PERFORMANCE STATUS: 0 - Asymptomatic  Vitals:   04/02/21 0940  BP: 140/77  Pulse: 65  Resp: 18  Temp: (!) 97.3 F (36.3 C)  SpO2: 96%   Filed Weights   04/02/21 0940  Weight: (!) 327 lb (148.3 kg)   Physical Exam Constitutional:      Appearance: Normal appearance.  HENT:     Head: Normocephalic and atraumatic.  Cardiovascular:     Rate and Rhythm: Normal rate and regular rhythm.  Pulmonary:     Effort: Pulmonary effort is normal.     Breath sounds: Normal breath sounds.  Abdominal:     General: Abdomen is flat.     Palpations: Abdomen is soft.   Musculoskeletal:        General: No swelling or tenderness.  Cervical back: Normal range of motion and neck supple. No rigidity.  Lymphadenopathy:     Cervical: No cervical adenopathy.  Skin:    General: Skin is warm and dry.  Neurological:     General: No focal deficit present.     Mental Status: She is alert.      LABORATORY DATA:  I have reviewed the data as listed Lab Results  Component Value Date   WBC 4.5 10/23/2020   HGB 11.4 (L) 10/23/2020   HCT 36.3 10/23/2020   MCV 81.4 10/23/2020   PLT 180.0 10/23/2020     Chemistry      Component Value Date/Time   NA 142 10/23/2020 1523   NA 140 12/06/2012 1444   K 4.8 10/23/2020 1523   K 5.1 12/06/2012 1444   CL 105 10/23/2020 1523   CO2 30 10/23/2020 1523   CO2 23 12/06/2012 1444   BUN 14 10/23/2020 1523   BUN 21.4 12/06/2012 1444   CREATININE 1.02 10/23/2020 1523   CREATININE 1.1 12/06/2012 1444      Component Value Date/Time   CALCIUM 8.9 10/23/2020 1523   CALCIUM 8.7 12/06/2012 1444   ALKPHOS 77 04/05/2020 0856   ALKPHOS 57 12/06/2012 1444   AST 52 (H) 04/05/2020 0856   AST 24 12/06/2012 1444   ALT 23 04/05/2020 0856   ALT 13 12/06/2012 1444   BILITOT 0.5 04/05/2020 0856   BILITOT <0.20 12/06/2012 1444       RADIOGRAPHIC STUDIES: I have personally reviewed the radiological images as listed and agreed with the findings in the report. No results found.  All questions were answered. The patient knows to call the clinic with any problems, questions or concerns.     Cheryl Pike, MD 04/02/2021 10:10 AM

## 2021-04-02 NOTE — Assessment & Plan Note (Addendum)
This is a very pleasant 56 year old female patient with past medical history significant for iron deficiency anemia status post iron infusion who is here for follow-up lab work and additional recommendations. Since her last iron infusion, she felt quite well, pica has resolved instantly.  Shortness of breath significantly improved. Physical examination today unremarkable. We will repeat labs today. If she continues to have iron deficiency anemia, we will try to repeat intravenous iron again.  Most likely her IDA secondary to malabsorption because of gastric sleeve surgery in the past.

## 2021-04-03 ENCOUNTER — Telehealth: Payer: Self-pay | Admitting: *Deleted

## 2021-04-03 NOTE — Telephone Encounter (Addendum)
-----   Message from Rachel Moulds, MD sent at 04/02/2021 12:55 PM EST ----- ?Labs indicate no iron deficiency, can you let her know that there is no need for iron infusion at this time. ? ?Thanks, ? ?This RN called and informed pt. ?

## 2021-04-04 NOTE — Telephone Encounter (Signed)
Form faxed and pt notified and copy mailed to pt and copy sent to scanning  ?

## 2021-04-29 ENCOUNTER — Other Ambulatory Visit: Payer: Self-pay | Admitting: Family Medicine

## 2021-05-02 ENCOUNTER — Other Ambulatory Visit: Payer: Self-pay | Admitting: Family Medicine

## 2021-05-02 ENCOUNTER — Other Ambulatory Visit: Payer: Self-pay | Admitting: Internal Medicine

## 2021-05-02 NOTE — Telephone Encounter (Signed)
Pharmacy is asking med to be changed from the 20 mg BID to the 40 mg once daily because insurance only pays for one pill daily max. Please advise  ?

## 2021-05-03 ENCOUNTER — Ambulatory Visit (INDEPENDENT_AMBULATORY_CARE_PROVIDER_SITE_OTHER): Payer: 59

## 2021-05-03 DIAGNOSIS — Z7901 Long term (current) use of anticoagulants: Secondary | ICD-10-CM

## 2021-05-03 DIAGNOSIS — E538 Deficiency of other specified B group vitamins: Secondary | ICD-10-CM | POA: Diagnosis not present

## 2021-05-03 LAB — POCT INR: INR: 2.7 (ref 2.0–3.0)

## 2021-05-03 MED ORDER — CYANOCOBALAMIN 1000 MCG/ML IJ SOLN
1000.0000 ug | Freq: Once | INTRAMUSCULAR | Status: AC
  Administered 2021-05-03: 1000 ug via INTRAMUSCULAR

## 2021-05-03 NOTE — Progress Notes (Addendum)
Continue 1 1/2 tablets daily except take 1 tablet on Monday and Thursdays. Recheck in 5 weeks.  ? ?Per orders of Dr. Jonny Ruiz in Dr. Royden Purl absence, injection of B12 given in R. Deltoid per pt request given by Sherrie George. ?Patient tolerated injection well. ? ? ?

## 2021-05-03 NOTE — Progress Notes (Signed)
Medical screening examination/treatment/procedure(s) were performed by non-physician practitioner and as supervising physician I was immediately available for consultation/collaboration. I agree with above. Kannon Granderson, MD   

## 2021-05-03 NOTE — Patient Instructions (Addendum)
Pre visit review using our clinic review tool, if applicable. No additional management support is needed unless otherwise documented below in the visit note. ? ?Continue 1 1/2 tablets daily except take 1 tablet on Monday and Thursdays. Recheck in 5 weeks.  ?

## 2021-06-07 ENCOUNTER — Ambulatory Visit (INDEPENDENT_AMBULATORY_CARE_PROVIDER_SITE_OTHER): Payer: 59

## 2021-06-07 DIAGNOSIS — E538 Deficiency of other specified B group vitamins: Secondary | ICD-10-CM | POA: Diagnosis not present

## 2021-06-07 DIAGNOSIS — Z7901 Long term (current) use of anticoagulants: Secondary | ICD-10-CM

## 2021-06-07 LAB — POCT INR: INR: 3.9 — AB (ref 2.0–3.0)

## 2021-06-07 MED ORDER — CYANOCOBALAMIN 1000 MCG/ML IJ SOLN
1000.0000 ug | Freq: Once | INTRAMUSCULAR | Status: AC
  Administered 2021-06-07: 1000 ug via INTRAMUSCULAR

## 2021-06-07 NOTE — Patient Instructions (Addendum)
Pre visit review using our clinic review tool, if applicable. No additional management support is needed unless otherwise documented below in the visit note. ? ?Hold dose today and only take 1/2 tablet tomorrow and then continue 1 1/2 tablet daily except take 1 tablet on Mondays and Thursdays. Recheck in 2 weeks.  ?

## 2021-06-07 NOTE — Progress Notes (Addendum)
Hold dose today and only take 1/2 tablet tomorrow and then continue 1 1/2 tablet daily except take 1 tablet on Mondays and Thursdays. Recheck in 2 weeks.  ? ?Per orders of Dr. Okey Dupre in Dr. Royden Purl absence, injection of B12 given in R deltoid per pt request by Sherrie George. ?Patient tolerated injection well. ? ?

## 2021-06-21 ENCOUNTER — Ambulatory Visit (INDEPENDENT_AMBULATORY_CARE_PROVIDER_SITE_OTHER): Payer: 59

## 2021-06-21 DIAGNOSIS — Z7901 Long term (current) use of anticoagulants: Secondary | ICD-10-CM | POA: Diagnosis not present

## 2021-06-21 LAB — POCT INR: INR: 3.6 — AB (ref 2.0–3.0)

## 2021-06-21 NOTE — Patient Instructions (Addendum)
Pre visit review using our clinic review tool, if applicable. No additional management support is needed unless otherwise documented below in the visit note.  Reduce dose today to take 1 tablet and then change weekly dose to take 1 1/2 tablets except take 1 tablet on Mondays, Wednesdays and Fridays.  Recheck in 2 weeks.

## 2021-06-21 NOTE — Progress Notes (Signed)
Reduce dose today to take 1 tablet and then change weekly dose to take 1 1/2 tablets except take 1 tablet on Mondays, Wednesdays and Fridays.  Recheck in 2 weeks.

## 2021-06-29 ENCOUNTER — Other Ambulatory Visit: Payer: Self-pay | Admitting: Internal Medicine

## 2021-07-02 ENCOUNTER — Other Ambulatory Visit: Payer: Self-pay | Admitting: Internal Medicine

## 2021-07-05 ENCOUNTER — Ambulatory Visit (INDEPENDENT_AMBULATORY_CARE_PROVIDER_SITE_OTHER): Payer: 59 | Admitting: Internal Medicine

## 2021-07-05 DIAGNOSIS — E538 Deficiency of other specified B group vitamins: Secondary | ICD-10-CM

## 2021-07-05 DIAGNOSIS — Z7901 Long term (current) use of anticoagulants: Secondary | ICD-10-CM

## 2021-07-05 LAB — POCT INR: INR: 3.2 — AB (ref 2.0–3.0)

## 2021-07-05 MED ORDER — CYANOCOBALAMIN 1000 MCG/ML IJ SOLN
1000.0000 ug | Freq: Once | INTRAMUSCULAR | Status: AC
  Administered 2021-07-05: 1000 ug via INTRAMUSCULAR

## 2021-07-05 NOTE — Patient Instructions (Addendum)
Pre visit review using our clinic review tool, if applicable. No additional management support is needed unless otherwise documented below in the visit note.  Continue taking 1 1/2 tablets except take 1 tablet on Mondays, Wednesdays and Fridays. Recheck in 6 weeks.

## 2021-07-05 NOTE — Progress Notes (Addendum)
Continue taking 1 1/2 tablets except take 1 tablet on Mondays, Wednesdays and Fridays.  Recheck in 6 weeks.   Per orders of Dr. Jonny Ruiz in Dr. Milinda Antis, injection of B12 given in R deltoid, per pt request, given by Sherrie George. Patient tolerated injection well.

## 2021-07-07 NOTE — Progress Notes (Signed)
Patient ID: [name], female   DOB: 11/17/1965, 55 y.o.   MRN: [id number] ° °Medical screening examination/treatment/procedure(s) were performed by non-physician practitioner and as supervising physician I was immediately available for consultation/collaboration.  I agree with above. Zettie Gootee, MD ° °

## 2021-07-28 ENCOUNTER — Other Ambulatory Visit: Payer: Self-pay | Admitting: Family Medicine

## 2021-08-09 ENCOUNTER — Ambulatory Visit: Payer: 59

## 2021-08-16 ENCOUNTER — Ambulatory Visit (INDEPENDENT_AMBULATORY_CARE_PROVIDER_SITE_OTHER): Payer: 59

## 2021-08-16 DIAGNOSIS — Z7901 Long term (current) use of anticoagulants: Secondary | ICD-10-CM

## 2021-08-16 DIAGNOSIS — E538 Deficiency of other specified B group vitamins: Secondary | ICD-10-CM

## 2021-08-16 LAB — POCT INR: INR: 1.8 — AB (ref 2.0–3.0)

## 2021-08-16 MED ORDER — CYANOCOBALAMIN 1000 MCG/ML IJ SOLN
1000.0000 ug | Freq: Once | INTRAMUSCULAR | Status: AC
  Administered 2021-08-16: 1000 ug via INTRAMUSCULAR

## 2021-08-16 NOTE — Patient Instructions (Addendum)
Pre visit review using our clinic review tool, if applicable. No additional management support is needed unless otherwise documented below in the visit note.  Increase dose today to take 1 1/2 tablets and increase dose tomorrow to take 2 tablets and then change weekly dose to take 1 1/2 tablets except take 1 tablet on Wednesdays. Recheck in 2 weeks.

## 2021-08-16 NOTE — Progress Notes (Signed)
Increase dose today to take 1 1/2 tablets and increase dose tomorrow to take 2 tablets and then change weekly dose to take 1 1/2 tablets except take 1 tablet on Wednesdays. Recheck in 2 weeks.   Per orders of Dr. Okey Dupre in Dr. Royden Purl absence, injection of B12 given in R deltoid per pt0 request given by Sherrie George. Patient tolerated injection well.

## 2021-08-17 ENCOUNTER — Other Ambulatory Visit: Payer: Self-pay | Admitting: Family Medicine

## 2021-08-17 DIAGNOSIS — Z7901 Long term (current) use of anticoagulants: Secondary | ICD-10-CM

## 2021-08-19 NOTE — Telephone Encounter (Signed)
Pt is compliant with warfarin management and PCP apts. ?Sent in refill.  ?

## 2021-08-26 ENCOUNTER — Other Ambulatory Visit: Payer: Self-pay

## 2021-08-26 ENCOUNTER — Telehealth: Payer: Self-pay | Admitting: Family Medicine

## 2021-08-26 ENCOUNTER — Other Ambulatory Visit (HOSPITAL_BASED_OUTPATIENT_CLINIC_OR_DEPARTMENT_OTHER): Payer: Self-pay

## 2021-08-26 ENCOUNTER — Encounter: Payer: Self-pay | Admitting: Hematology and Oncology

## 2021-08-26 ENCOUNTER — Encounter (HOSPITAL_BASED_OUTPATIENT_CLINIC_OR_DEPARTMENT_OTHER): Payer: Self-pay | Admitting: Emergency Medicine

## 2021-08-26 ENCOUNTER — Emergency Department (HOSPITAL_BASED_OUTPATIENT_CLINIC_OR_DEPARTMENT_OTHER): Payer: Managed Care, Other (non HMO) | Admitting: Radiology

## 2021-08-26 ENCOUNTER — Emergency Department (HOSPITAL_BASED_OUTPATIENT_CLINIC_OR_DEPARTMENT_OTHER)
Admission: EM | Admit: 2021-08-26 | Discharge: 2021-08-26 | Disposition: A | Discharge status: Home / Self Care | Payer: Managed Care, Other (non HMO) | Attending: Emergency Medicine | Admitting: Emergency Medicine

## 2021-08-26 DIAGNOSIS — Z20822 Contact with and (suspected) exposure to covid-19: Secondary | ICD-10-CM | POA: Diagnosis not present

## 2021-08-26 DIAGNOSIS — M79605 Pain in left leg: Secondary | ICD-10-CM | POA: Diagnosis not present

## 2021-08-26 DIAGNOSIS — I10 Essential (primary) hypertension: Secondary | ICD-10-CM | POA: Diagnosis not present

## 2021-08-26 DIAGNOSIS — Z79899 Other long term (current) drug therapy: Secondary | ICD-10-CM | POA: Diagnosis not present

## 2021-08-26 DIAGNOSIS — R0602 Shortness of breath: Secondary | ICD-10-CM | POA: Diagnosis present

## 2021-08-26 DIAGNOSIS — J209 Acute bronchitis, unspecified: Secondary | ICD-10-CM | POA: Insufficient documentation

## 2021-08-26 DIAGNOSIS — Z7982 Long term (current) use of aspirin: Secondary | ICD-10-CM | POA: Diagnosis not present

## 2021-08-26 DIAGNOSIS — M79604 Pain in right leg: Secondary | ICD-10-CM | POA: Diagnosis not present

## 2021-08-26 DIAGNOSIS — Z7901 Long term (current) use of anticoagulants: Secondary | ICD-10-CM | POA: Diagnosis not present

## 2021-08-26 LAB — PROTIME-INR
INR: 2.7 — ABNORMAL HIGH (ref 0.8–1.2)
Prothrombin Time: 28.6 seconds — ABNORMAL HIGH (ref 11.4–15.2)

## 2021-08-26 LAB — COMPREHENSIVE METABOLIC PANEL
ALT: 20 U/L (ref 0–44)
AST: 26 U/L (ref 15–41)
Albumin: 4.1 g/dL (ref 3.5–5.0)
Alkaline Phosphatase: 79 U/L (ref 38–126)
Anion gap: 9 (ref 5–15)
BUN: 16 mg/dL (ref 6–20)
CO2: 27 mmol/L (ref 22–32)
Calcium: 9.8 mg/dL (ref 8.9–10.3)
Chloride: 103 mmol/L (ref 98–111)
Creatinine, Ser: 1.05 mg/dL — ABNORMAL HIGH (ref 0.44–1.00)
GFR, Estimated: >60 mL/min (ref >60)
Glucose, Bld: 102 mg/dL — ABNORMAL HIGH (ref 70–99)
Potassium: 4.5 mmol/L (ref 3.5–5.1)
Sodium: 139 mmol/L (ref 135–145)
Total Bilirubin: 0.4 mg/dL (ref 0.3–1.2)
Total Protein: 7.7 g/dL (ref 6.5–8.1)

## 2021-08-26 LAB — CBC WITH DIFFERENTIAL/PLATELET
Abs Immature Granulocytes: 0.01 10*3/uL (ref 0.00–0.07)
Basophils Absolute: 0 10*3/uL (ref 0.0–0.1)
Basophils Relative: 1 %
Eosinophils Absolute: 0.2 10*3/uL (ref 0.0–0.5)
Eosinophils Relative: 5 %
HCT: 44 % (ref 36.0–46.0)
Hemoglobin: 13.4 g/dL (ref 12.0–15.0)
Immature Granulocytes: 0 %
Lymphocytes Relative: 26 %
Lymphs Abs: 1.2 10*3/uL (ref 0.7–4.0)
MCH: 25.2 pg — ABNORMAL LOW (ref 26.0–34.0)
MCHC: 30.5 g/dL (ref 30.0–36.0)
MCV: 82.7 fL (ref 80.0–100.0)
Monocytes Absolute: 0.4 10*3/uL (ref 0.1–1.0)
Monocytes Relative: 8 %
Neutro Abs: 2.7 10*3/uL (ref 1.7–7.7)
Neutrophils Relative %: 60 %
Platelets: 166 10*3/uL (ref 150–400)
RBC: 5.32 MIL/uL — ABNORMAL HIGH (ref 3.87–5.11)
RDW: 15.9 % — ABNORMAL HIGH (ref 11.5–15.5)
WBC: 4.4 10*3/uL (ref 4.0–10.5)
nRBC: 0 % (ref 0.0–0.2)

## 2021-08-26 LAB — BRAIN NATRIURETIC PEPTIDE: B Natriuretic Peptide: 43.6 pg/mL (ref 0.0–100.0)

## 2021-08-26 LAB — SARS CORONAVIRUS 2 BY RT PCR: SARS Coronavirus 2 by RT PCR: NEGATIVE

## 2021-08-26 MED ORDER — IPRATROPIUM-ALBUTEROL 0.5-2.5 (3) MG/3ML IN SOLN
RESPIRATORY_TRACT | Status: AC
  Administered 2021-08-26: 3 mL via RESPIRATORY_TRACT
  Filled 2021-08-26: qty 3

## 2021-08-26 MED ORDER — DOXYCYCLINE HYCLATE 100 MG PO CAPS
100.0000 mg | ORAL_CAPSULE | Freq: Two times a day (BID) | ORAL | 0 refills | Status: DC
  Filled 2021-08-26: qty 20, 10d supply, fill #0

## 2021-08-26 MED ORDER — IPRATROPIUM-ALBUTEROL 0.5-2.5 (3) MG/3ML IN SOLN: 3.0000 mL | Freq: Once | RESPIRATORY_TRACT | Status: AC

## 2021-08-26 MED ORDER — ALBUTEROL SULFATE HFA 108 (90 BASE) MCG/ACT IN AERS
1.0000 | INHALATION_SPRAY | Freq: Four times a day (QID) | RESPIRATORY_TRACT | 0 refills | Status: DC | PRN
  Filled 2021-08-26: qty 6.7, 25d supply, fill #0

## 2021-08-26 MED ORDER — PREDNISONE 10 MG PO TABS
50.0000 mg | ORAL_TABLET | Freq: Every day | ORAL | 0 refills | Status: AC
  Filled 2021-08-26: qty 15, 3d supply, fill #0
  Filled 2021-08-30: qty 25, 5d supply, fill #0

## 2021-08-26 NOTE — Telephone Encounter (Signed)
Patient called in stating she was coughing, congested, wheezing, couldn't sleep and was experiencing shortness of breath. Sent over to triage.

## 2021-08-26 NOTE — ED Provider Notes (Signed)
[facility] DEPT Provider Note   CSN: [id number] Arrival date & time: 08/26/21  1044     History  Chief Complaint  Patient presents with   Cough   Shortness of Breath    [name] is a 56 y.o. female.  56 year old female presents with complaint of cough, shortness of breath and wheezing.  Patient states her symptoms started about 3 days ago with a sore throat and cough.  Feels like she did have a fever 2 days ago, afebrile today.  Notes that when she tries to lay flat in bed she becomes significantly short of breath.  States that she has never had this happen before.  She does have some swelling in her legs but reports this to be baseline.  Has a history of aortic valve replacement, no history of CHF.  Reports having bilateral pleural effusions previously as well as an empyema, this was attributed to her lupus.  Patient is on Coumadin, last INR was 1.8, 10 days ago, Coumadin dose was adjusted.  No history of asthma or chronic lung disease. Denies chest pain. No other complaints or concerns.        Home Medications Prior to Admission medications   Medication Sig Start Date End Date Taking? Authorizing Provider  doxycycline (VIBRAMYCIN) 100 MG capsule Take 1 capsule (100 mg total) by mouth 2 (two) times daily. 08/26/21  Yes Tacy Learn, PA-C  predniSONE (DELTASONE) 10 MG tablet Take 5 tablets (50 mg total) by mouth daily for 5 days. 08/26/21 08/31/21 Yes Tacy Learn, PA-C  albuterol (VENTOLIN HFA) 108 (90 Base) MCG/ACT inhaler Inhale 1-2 puffs into the lungs every 6 (six) hours as needed for wheezing or shortness of breath. 08/26/21   Tacy Learn, PA-C  amoxicillin (AMOXIL) 500 MG tablet TAKE 4 TABS (2000 MG) 30-60 MIN BEFORE DENTAL PROCEDURES 01/09/21   [name], MD  aspirin EC 81 MG EC tablet Take 1 tablet (81 mg total) by mouth daily. Swallow whole. 04/11/20   Nani Skillern, PA-C  bismuth subsalicylate (PEPTO BISMOL) 262 MG/15ML  suspension Take 30 mLs by mouth every 6 (six) hours as needed for indigestion or diarrhea or loose stools.    [provider]  calcium carbonate (TUMS - DOSED IN MG ELEMENTAL CALCIUM) 500 MG chewable tablet Chew 1-2 tablets by mouth 3 (three) times daily as needed for indigestion or heartburn.    [provider]  cyanocobalamin (,VITAMIN B-12,) 1000 MCG/ML injection Inject 1,000 mcg into the muscle every 30 (thirty) days.  10/06/19   [provider]  ezetimibe (ZETIA) 10 MG tablet Take 1 tablet (10 mg total) by mouth daily. 07/03/21   [name], MD  hydroxychloroquine (PLAQUENIL) 200 MG tablet Take 200 mg by mouth 2 (two) times daily.    [provider]  levothyroxine (SYNTHROID) 200 MCG tablet TAKE 1 TABLET (200 MCG TOTAL) BY MOUTH DAILY BEFORE BREAKFAST. 07/29/21   Tower, [name], MD  metoprolol tartrate (LOPRESSOR) 50 MG tablet Take 1 tablet (50 mg total) by mouth 2 (two) times daily. 07/03/21   [name], MD  Multiple Vitamins-Minerals (BARIATRIC MULTIVITAMINS/IRON) CAPS Take 1 tablet by mouth daily.    [provider]  omeprazole (PRILOSEC) 40 MG capsule Take 1 capsule (40 mg total) by mouth daily. 05/02/21   Tower, [name], MD  PARoxetine (PAXIL) 40 MG tablet TAKE 1 TABLET BY MOUTH EVERY DAY IN THE MORNING 02/11/21   Tower, [name], MD  rosuvastatin (CRESTOR) 40 MG tablet Take 1 tablet (40 mg total) by mouth daily. 09/20/20   [name], MD  warfarin (COUMADIN) 7.5 MG tablet TAKE 1 1/2 TABLETS (11.25MG ) DAILY BY MOUTH EXCEPT TAKE 1 TABLET (7.5MG ) ON WEDNESDAYS OR AS DIRECTED BY ANTICOAGULATION CLINIC 08/19/21   Tower, [name], MD      Allergies    Lisinopril, Lovenox [enoxaparin sodium], Moxifloxacin, and Lovenox [enoxaparin]    Review of Systems   Review of Systems Negative except as per HPI Physical Exam Updated Vital Signs BP 125/71 (BP Location: Right Wrist)   Pulse 67   Temp 98.3 F (36.8 C) (Oral)   Resp 17   Ht 5\' 5"  (1.651 m)    Wt (!) 145.6 kg   LMP 11/08/2010   SpO2 95%   BMI 53.42 kg/m  Physical Exam Vitals and nursing note reviewed.  Constitutional:      General: She is not in acute distress.    Appearance: She is well-developed. She is not diaphoretic.  HENT:     Head: Normocephalic and atraumatic.  Cardiovascular:     Rate and Rhythm: Normal rate and regular rhythm.  Pulmonary:     Effort: Pulmonary effort is normal.     Breath sounds: Examination of the right-lower field reveals decreased breath sounds. Examination of the left-lower field reveals decreased breath sounds. Decreased breath sounds and wheezing present.  Abdominal:     Palpations: Abdomen is soft.     Tenderness: There is no abdominal tenderness.  Musculoskeletal:     Cervical back: Neck supple.     Right lower leg: Tenderness present.     Left lower leg: Tenderness present.     Comments: Trace pitting edema bilateral lower extremities.  Skin:    General: Skin is warm and dry.  Neurological:     Mental Status: She is alert and oriented to person, place, and time.  Psychiatric:        Behavior: Behavior normal.     ED Results / Procedures / Treatments   Labs (all labs ordered are listed, but only abnormal results are displayed) Labs Reviewed  CBC WITH DIFFERENTIAL/PLATELET - Abnormal; Notable for the following components:      Result Value   RBC 5.32 (*)    MCH 25.2 (*)    RDW 15.9 (*)    All other components within normal limits  COMPREHENSIVE METABOLIC PANEL - Abnormal; Notable for the following components:   Glucose, Bld 102 (*)    Creatinine, Ser 1.05 (*)    All other components within normal limits  PROTIME-INR - Abnormal; Notable for the following components:   Prothrombin Time 28.6 (*)    INR 2.7 (*)    All other components within normal limits  SARS CORONAVIRUS 2 BY RT PCR  BRAIN NATRIURETIC PEPTIDE    EKG None  Radiology DG Chest 2 View  Result Date: 08/26/2021 CLINICAL DATA:  Cough EXAM: CHEST - 2  VIEW COMPARISON:  Radiographs 08/13/2020 FINDINGS: No significant change from 08/13/2020. Prior median sternotomy and cardiac valve replacement. Stable cardiomediastinal contours. Large peripherally calcified left-sided pleural fluid collection. Pulmonary vascular congestion and interstitial prominence. IMPRESSION: 1. Pulmonary vascular congestion with possible mild edema 2. Unchanged large left-sided pleural fluid collection. Electronically Signed   By: Placido Sou M.D.   On: 08/26/2021 12:34    Procedures Procedures    Medications Ordered in ED Medications  ipratropium-albuterol (DUONEB) 0.5-2.5 (3) MG/3ML nebulizer solution 3 mL (3 mLs Nebulization Given 08/26/21  1214)    ED Course/ Medical Decision Making/ A&P                           Medical Decision Making Amount and/or Complexity of Data Reviewed Labs: ordered. Radiology: ordered.  Risk Prescription drug management.   This patient presents to the ED for concern of shortness of breath, congestion, cough, this involves an extensive number of treatment options, and is a complaint that carries with it a high risk of complications and morbidity.  The differential diagnosis includes but not limited to COVID, viral illness, pneumonia, bronchitis, CHF, PE   Co morbidities that complicate the patient evaluation  Lupus, on Plaquenil, aortic valve replacement, PE, on Coumadin, hypertension, GERD   Additional history obtained:  External records from outside source obtained and reviewed including echo, 05/21/2020, EF 60-65%, reassuring study   Lab Tests:  I Ordered, and personally interpreted labs.  The pertinent results include: INR therapeutic at 2.7.  COVID-negative.  BNP 43.6, normal.  CMP and CBC without significant findings.   Imaging Studies ordered:  I ordered imaging studies including chest x-ray I independently visualized and interpreted imaging which showed vascular congestion, left effusion.  Effusion unchanged  from prior study dated 08/13/2020 I agree with the radiologist interpretation   Cardiac Monitoring: / EKG:  The patient was maintained on a cardiac monitor.  I personally viewed and interpreted the cardiac monitored which showed an underlying rhythm of: Sinus rhythm, rate 71   Consultations Obtained:  I requested consultation with the ER attending, Dr. Jodi Mourning,  and discussed lab and imaging findings as well as pertinent plan - they recommend: Agree with plan for doxycycline for coverage due to immunocompromise state, course of prednisone, albuterol inhaler and taking her fluid pills for the next 2 days.   Problem List / ED Course / Critical interventions / Medication management  56 year old female with history as above with complaint of sore throat, cough and wheezing onset 4 days ago, progressively worsening.  Patient's chest x-ray with mild vascular congestion and chronic effusion.  Labs overall reassuring including therapeutic INR and normal BNP.  Symptoms improved with DuoNeb treatment in the emergency room.  Patient is discharged with prednisone, albuterol inhaler and doxycycline.  Suspect viral illness such as bronchitis however with her underlying health concerns and immunocompromise state, concern for developing bacterial infection.  She is advised to take her fluid pill for the next few days and to recheck with her primary care provider.  Advised patient her INR will need recheck as medications especially antibiotics can affect her INR. I ordered medication including dupneb  for wheezing, SHOB  Reevaluation of the patient after these medicines showed that the patient improved I have reviewed the patients home medicines and have made adjustments as needed   Social Determinants of Health:  Has PCP   Test / Admission - Considered:  Admission considered however after ER workup, felt stable with plan for close PCP follow up. No supplemental O2 requirement, symptoms improved after neb  treatment.          Final Clinical Impression(s) / ED Diagnoses Final diagnoses:  Acute bronchitis, unspecified organism    Rx / DC Orders ED Discharge Orders          Ordered    doxycycline (VIBRAMYCIN) 100 MG capsule  2 times daily        08/26/21 1330    albuterol (VENTOLIN HFA) 108 (90 Base) MCG/ACT inhaler  Every  6 hours PRN        08/26/21 1330    predniSONE (DELTASONE) 10 MG tablet  Daily        08/26/21 1330              Alden Hipp 08/26/21 1841    Blane Ohara, MD 08/27/21 1036

## 2021-08-26 NOTE — ED Notes (Signed)
Patient transported to X-ray 

## 2021-08-26 NOTE — Discharge Instructions (Signed)
Suspect a viral infection but due to your personal risk for developing a bacterial infection- antibiotics have been sent to your pharmacy. Recommend a course of prednisone and as needed use of an inhaler. You can use your fluid pill at home for the next 2-3 days as well. Medications can interact with your INR.Coumadin. Recommend recheck  INR in the next few days with PCP recheck this week. Return to the ER at any time for worsening or concerning symptoms.

## 2021-08-26 NOTE — ED Notes (Signed)
Rt at bedside

## 2021-08-26 NOTE — Telephone Encounter (Signed)
Per chart review tab pt is presently at [facility] ED. Sending note to Dr Milinda Antis and [place] CMA.

## 2021-08-26 NOTE — Telephone Encounter (Signed)
[facility] Day - Client TELEPHONE ADVICE RECORD [facility] Patient Name: [name] BJ Gender: Female DOB: [birth year] Age: 56 Y 22 D Return Phone Number: [phone] (Primary) Address: City/ State/ Zip: [place]  [address] Client [facility] Day - Client Client Site [facility] - Day Provider Milinda Antis, Idamae Schuller - MD Contact Type Call Who Is Calling Patient / Member / Family / Caregiver Call Type Triage / Clinical Relationship To Patient Self Return Phone Number [phone] (Primary) Chief Complaint BREATHING - shortness of breath or sounds breathless Reason for Call Symptomatic / Request for Health Information Initial Comment [name] congestion wheezing and shortness of breath. Translation No Nurse Assessment Nurse: Henri Medal, RN, Amy Date/Time (Eastern Time): 08/26/2021 9:10:19 AM Confirm and document reason for call. If symptomatic, describe symptoms. ---[name], congestion, wheezing, and shortness of breath. It started Friday. She had a continuous tickle in her throat. It proceeded to get worse. Friday night she had to get up in the middle of the night, because he could not breath when laying back. She has been sleeping in the recliner the past couple nights. She is coughing so hard that she has to sit up. Overall achiness. It is hard to walk to the next room without stopping to get her breath. She is spitting up green yellow tannish sputum. She had a fever & chills Sat. but not now. Covid test negative. O2 sat is 91-93% with out exertion. Does the patient have any new or worsening symptoms? ---Yes Will a triage be completed? ---Yes Related visit to physician within the last 2 weeks? ---No Does the PT have any chronic conditions? (i.e. diabetes, asthma, this includes High risk factors for pregnancy, etc.) ---Yes List chronic  conditions. ---Lupus, heart valve replacement, Factor 5, DVT, PE Is this a behavioral health or substance abuse call? ---No PLEASE NOTE: All timestamps contained within this report are represented as [place] Standard Time. CONFIDENTIALTY NOTICE: This fax transmission is intended only for the addressee. It contains information that is legally privileged, confidential or otherwise protected from use or disclosure. If you are not the intended recipient, you are strictly prohibited from reviewing, disclosing, copying using or disseminating any of this information or taking any action in reliance on or regarding this information. If you have received this fax in error, please notify us immediately by telephone so that we can arrange for its return to [place]. Phone: [phone], Toll-Free: [phone], Fax: [phone] Page: 2 of 2 Call Id: [id number] Guidelines Guideline Title Affirmed Question Affirmed Notes Nurse Date/Time Lamount Cohen Time) Breathing Difficulty History of inherited increased risk of blood clots (e.g., Factor 5 Leiden, Anti-thrombin 3, Protein C or Protein S deficiency, Prothrombin mutation) Lovelace, RN, Amy 08/26/2021 9:13:22 AM Disp. Time Lamount Cohen Time) Disposition Final User 08/26/2021 9:09:23 AM Send to Urgent Queue Richrd Humbles 08/26/2021 9:16:33 AM Go to ED Now Yes Lovelace, RN, Amy Final Disposition 08/26/2021 9:16:33 AM Go to ED Now Yes Lovelace, RN, Amy Caller Disagree/Comply Comply Caller Understands Yes PreDisposition InappropriateToAsk Care Advice Given Per Guideline GO TO ED NOW: * You need to be seen in the Emergency Department. * Go to the ED at ___________ Hospital. * Leave now. Drive carefully. NOTE TO TRIAGER - DRIVING: * Another adult should drive. * Patient should not delay going to the emergency department. BRING MEDICINES: * Bring a list of your current medicines when you go to the Emergency Department (  ER). * Bring the pill bottles too. This will  help the doctor (or NP/PA) to make certain you are taking the right medicines and the right dose. CARE ADVICE given per Breathing Difficulty (Adult) guideline. Referrals [facility] - E

## 2021-08-26 NOTE — Telephone Encounter (Signed)
Aware, will watch for correspondence  

## 2021-08-26 NOTE — ED Triage Notes (Signed)
Pt arrives to ED with c/o cough, chest congestion, and shortness of breath. The start of her symptoms were 08/23/21. She reports intermittent fever and SOB while laying flat that is typically relieved when sitting up or leaning forward. Her cough is productive with green/gray/tan colored.

## 2021-08-28 ENCOUNTER — Telehealth: Payer: Self-pay

## 2021-08-28 NOTE — Telephone Encounter (Signed)
Message Received: Kristen Loader, Audrie Gallus, MD  Sherrie George, RN Heads up regarding INR with new abx prescription - unsure when you want to see her next, thanks    Pt in ER on  7/24 for acute bronchitis and was prescribed doxycycline. Pt has coumadin clinic apt for 7/28 to assess for interactions.

## 2021-08-30 ENCOUNTER — Ambulatory Visit (INDEPENDENT_AMBULATORY_CARE_PROVIDER_SITE_OTHER): Payer: 59

## 2021-08-30 ENCOUNTER — Other Ambulatory Visit (HOSPITAL_BASED_OUTPATIENT_CLINIC_OR_DEPARTMENT_OTHER): Payer: Self-pay

## 2021-08-30 DIAGNOSIS — Z7901 Long term (current) use of anticoagulants: Secondary | ICD-10-CM

## 2021-08-30 LAB — POCT INR: INR: 4.8 — AB (ref 2.0–3.0)

## 2021-08-30 NOTE — Progress Notes (Signed)
Pt in ER on 7/24 for bronchitis and prescribed prednisone for 3 days and doxycycline for 10 days.  Hold dose today and hold dose tomorrow and then follow directions below 7/28: Hold 7/29: Hold 7/30: Take 1 1/2 tablets 7/31: Take 1 tablet 8/1: Take 1 tablet 8/2: Take 1 tablet 8/3: Take 1 1/2 tablets 8/4: Recheck INR

## 2021-08-30 NOTE — Patient Instructions (Addendum)
Pre visit review using our clinic review tool, if applicable. No additional management support is needed unless otherwise documented below in the visit note.  Hold dose today and hold dose tomorrow and then follow directions below 7/28: Hold 7/29: Hold 7/30: Take 1 1/2 tablets 7/31: Take 1 tablet 8/1: Take 1 tablet 8/2: Take 1 tablet 8/3: Take 1 1/2 tablets 8/4: Recheck INR

## 2021-09-01 ENCOUNTER — Other Ambulatory Visit: Payer: Self-pay | Admitting: Family Medicine

## 2021-09-02 NOTE — Telephone Encounter (Signed)
Called and lvm for patient to call us back to make appt , regarding refills.

## 2021-09-02 NOTE — Telephone Encounter (Signed)
Last filled on 05/30/21 Last ov 10/23/20

## 2021-09-06 ENCOUNTER — Ambulatory Visit: Payer: Managed Care, Other (non HMO)

## 2021-09-06 DIAGNOSIS — Z7901 Long term (current) use of anticoagulants: Secondary | ICD-10-CM | POA: Diagnosis not present

## 2021-09-06 LAB — POCT INR: INR: 1.4 — AB (ref 2.0–3.0)

## 2021-09-06 NOTE — Patient Instructions (Addendum)
Pre visit review using our clinic review tool, if applicable. No additional management support is needed unless otherwise documented below in the visit note.  Increase dose today and tomorrow to take 2 1/2 tablets and then continue 1 1/2 tablets daily except take 1 tablet on Wednesdays. Recheck in 3 weeks.

## 2021-09-06 NOTE — Progress Notes (Addendum)
Pt finished doxycycline course of 10 days.   Increase dose today and tomorrow to take 2 1/2 tablets and then continue 1 1/2 tablets daily except take 1 tablet on Wednesdays. Recheck in 3 weeks.

## 2021-09-11 ENCOUNTER — Other Ambulatory Visit (HOSPITAL_BASED_OUTPATIENT_CLINIC_OR_DEPARTMENT_OTHER): Payer: Self-pay

## 2021-09-13 ENCOUNTER — Other Ambulatory Visit: Payer: Self-pay | Admitting: Internal Medicine

## 2021-09-27 ENCOUNTER — Ambulatory Visit (INDEPENDENT_AMBULATORY_CARE_PROVIDER_SITE_OTHER): Payer: Managed Care, Other (non HMO)

## 2021-09-27 DIAGNOSIS — Z7901 Long term (current) use of anticoagulants: Secondary | ICD-10-CM

## 2021-09-27 DIAGNOSIS — E538 Deficiency of other specified B group vitamins: Secondary | ICD-10-CM | POA: Diagnosis not present

## 2021-09-27 LAB — POCT INR: INR: 2.1 (ref 2.0–3.0)

## 2021-09-27 MED ORDER — CYANOCOBALAMIN 1000 MCG/ML IJ SOLN
1000.0000 ug | Freq: Once | INTRAMUSCULAR | Status: AC
  Administered 2021-09-27: 1000 ug via INTRAMUSCULAR

## 2021-09-27 NOTE — Patient Instructions (Addendum)
Pre visit review using our clinic review tool, if applicable. No additional management support is needed unless otherwise documented below in the visit note.  Increase dose today and then change weekly dose to take 1 1/2 tablets daily except take 2 tablets on Mondays.  Recheck in 2 weeks.

## 2021-09-27 NOTE — Progress Notes (Addendum)
Increase dose today and then change weekly dose to take 1 1/2 tablets daily except take 2 tablets on Mondays.  Recheck in 2 weeks.    Per orders of Dr. Okey Dupre in Dr. Royden Purl absence, injection of B12 given in R deltoid per pt request given by Sherrie George. Patient tolerated injection well.

## 2021-09-30 ENCOUNTER — Other Ambulatory Visit: Payer: Self-pay | Admitting: Family Medicine

## 2021-09-30 ENCOUNTER — Inpatient Hospital Stay: Payer: Managed Care, Other (non HMO)

## 2021-09-30 ENCOUNTER — Encounter: Payer: Self-pay | Admitting: Hematology and Oncology

## 2021-09-30 ENCOUNTER — Inpatient Hospital Stay: Payer: Managed Care, Other (non HMO) | Attending: Hematology and Oncology | Admitting: Hematology and Oncology

## 2021-09-30 VITALS — BP 131/71 | HR 85 | Temp 97.8°F | Resp 18 | Ht 65.0 in | Wt 330.2 lb

## 2021-09-30 DIAGNOSIS — G4730 Sleep apnea, unspecified: Secondary | ICD-10-CM | POA: Insufficient documentation

## 2021-09-30 DIAGNOSIS — K219 Gastro-esophageal reflux disease without esophagitis: Secondary | ICD-10-CM | POA: Insufficient documentation

## 2021-09-30 DIAGNOSIS — D509 Iron deficiency anemia, unspecified: Secondary | ICD-10-CM

## 2021-09-30 DIAGNOSIS — E785 Hyperlipidemia, unspecified: Secondary | ICD-10-CM | POA: Insufficient documentation

## 2021-09-30 DIAGNOSIS — Z7901 Long term (current) use of anticoagulants: Secondary | ICD-10-CM | POA: Diagnosis not present

## 2021-09-30 DIAGNOSIS — E669 Obesity, unspecified: Secondary | ICD-10-CM | POA: Insufficient documentation

## 2021-09-30 DIAGNOSIS — I350 Nonrheumatic aortic (valve) stenosis: Secondary | ICD-10-CM | POA: Insufficient documentation

## 2021-09-30 DIAGNOSIS — Z79899 Other long term (current) drug therapy: Secondary | ICD-10-CM | POA: Insufficient documentation

## 2021-09-30 DIAGNOSIS — D6851 Activated protein C resistance: Secondary | ICD-10-CM | POA: Diagnosis not present

## 2021-09-30 DIAGNOSIS — Z79890 Hormone replacement therapy (postmenopausal): Secondary | ICD-10-CM | POA: Diagnosis not present

## 2021-09-30 DIAGNOSIS — Z86718 Personal history of other venous thrombosis and embolism: Secondary | ICD-10-CM | POA: Insufficient documentation

## 2021-09-30 DIAGNOSIS — I10 Essential (primary) hypertension: Secondary | ICD-10-CM | POA: Insufficient documentation

## 2021-09-30 DIAGNOSIS — M329 Systemic lupus erythematosus, unspecified: Secondary | ICD-10-CM | POA: Diagnosis not present

## 2021-09-30 LAB — IRON AND IRON BINDING CAPACITY (CC-WL,HP ONLY)
Iron: 42 ug/dL (ref 28–170)
Saturation Ratios: 11 % (ref 10.4–31.8)
TIBC: 377 ug/dL (ref 250–450)
UIBC: 335 ug/dL (ref 148–442)

## 2021-09-30 LAB — CBC WITH DIFFERENTIAL/PLATELET
Abs Immature Granulocytes: 0.01 10*3/uL (ref 0.00–0.07)
Basophils Absolute: 0 10*3/uL (ref 0.0–0.1)
Basophils Relative: 1 %
Eosinophils Absolute: 0.3 10*3/uL (ref 0.0–0.5)
Eosinophils Relative: 4 %
HCT: 38.7 % (ref 36.0–46.0)
Hemoglobin: 12 g/dL (ref 12.0–15.0)
Immature Granulocytes: 0 %
Lymphocytes Relative: 17 %
Lymphs Abs: 1.1 10*3/uL (ref 0.7–4.0)
MCH: 25.1 pg — ABNORMAL LOW (ref 26.0–34.0)
MCHC: 31 g/dL (ref 30.0–36.0)
MCV: 81 fL (ref 80.0–100.0)
Monocytes Absolute: 0.4 10*3/uL (ref 0.1–1.0)
Monocytes Relative: 7 %
Neutro Abs: 4.6 10*3/uL (ref 1.7–7.7)
Neutrophils Relative %: 71 %
Platelets: 190 10*3/uL (ref 150–400)
RBC: 4.78 MIL/uL (ref 3.87–5.11)
RDW: 15.9 % — ABNORMAL HIGH (ref 11.5–15.5)
WBC: 6.5 10*3/uL (ref 4.0–10.5)
nRBC: 0 % (ref 0.0–0.2)

## 2021-09-30 LAB — FERRITIN: Ferritin: 58 ng/mL (ref 11–307)

## 2021-09-30 NOTE — Assessment & Plan Note (Signed)
Continue Coumadin indefinitely.

## 2021-09-30 NOTE — Assessment & Plan Note (Signed)
This is a very pleasant 57 year old female patient with past medical history significant for iron deficiency anemia status post iron infusion who is here for follow-up lab work and additional recommendations. She is doing quite well today.  She denies any acute blood loss.  She has been taking some oral iron, dose unknown.  Physical examination today with chronic venous stasis changes, otherwise no new complaints.  We will repeat CBC, iron panel and ferritin today.  She also has homozygous factor V Leiden mutation and takes anticoagulation indefinitely. She will return to clinic in 6 months or sooner as needed.  Age-appropriate cancer screening, up-to-date

## 2021-09-30 NOTE — Progress Notes (Signed)
[name] CONSULT NOTE  Patient Care Team: Tower, [name], MD as PCP - General (Family Medicine) [name], MD as PCP - Cardiology (Cardiology) [name], MD (Pulmonary Disease) [name], MD as Consulting Physician (Oncology) [name], MD as Consulting Physician (Nephrology) [name], MD as Consulting Physician (Rheumatology)  CHIEF COMPLAINTS/PURPOSE OF CONSULTATION:  Iron def anemia, follow up after iron infusion  ASSESSMENT & PLAN:   IDA (iron deficiency anemia) This is a very pleasant 56 year old female patient with past medical history significant for iron deficiency anemia status post iron infusion who is here for follow-up lab work and additional recommendations. She is doing quite well today.  She denies any acute blood loss.  She has been taking some oral iron, dose unknown.  Physical examination today with chronic venous stasis changes, otherwise no new complaints.  We will repeat CBC, iron panel and ferritin today.  She also has homozygous factor V Leiden mutation and takes anticoagulation indefinitely. She will return to clinic in 6 months or sooner as needed.  Age-appropriate cancer screening, up-to-date  Homozygous Factor V Leiden mutation ([place]) Continue Coumadin indefinitely.     Orders Placed This Encounter  Procedures   CBC with Differential/Platelet    Standing Status:   Standing    Number of Occurrences:   22    Standing Expiration Date:   10/01/2022   Iron and Iron Binding Capacity ([facility] only)    Standing Status:   Future    Standing Expiration Date:   10/01/2022   Ferritin    Standing Status:   Future    Standing Expiration Date:   09/30/2022      HISTORY OF PRESENTING ILLNESS:   [name] 56 y.o. female is here because of IDA.  This is a very pleasant 56 yr old female patient with iron deficiency anemia, s.p last iron infusion about 3 yrs ago, multiple DVT/PE and homozygous factor V  leiden mutation, aortic stenosis status post aortic valve replacement, SLE, obesity referred to hematology for evaluation of anemia as well as given her history of homozygous factor V Leiden mutation.    She is here for follow-up.  She denies any new health complaints. Energy is overall good. She denies any blood loss per rectum. No melena. She has been taking oral iron, dose unknown.   Rest of the pertinent 10 point ROS reviewed and negative.  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    Loculated-chronic on Left-VanTright; s/p VATS 5/10   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   GERD (gastroesophageal reflux disease)    on prilosec r/t gastric sleeve surgery   HLD (hyperlipidemia)    Iron deficiency anemia    IV dextran Coladonato   Nephritis and nephropathy, not specified as acute or chronic, with unspecified pathological lesion in kidney    Nonspecific abnormal results of liver function study    Obesity, unspecified    Other specified acquired hypothyroidism    Panic disorder without agoraphobia    Personal history of venous thrombosis and embolism 2002   during pregnancy; ?factor 5 leiden (sees heme)   Pleural effusion 2005   c/w lupus  initial w/u; recurrent Right as pred tapered off July 2011   Sleep apnea    Systemic lupus erythematosus ([place])    renal GN, hx of pericardial eff in late 90's   Unspecified essential hypertension     SURGICAL HISTORY: Past Surgical History:  Procedure Laterality Date   AORTIC VALVE REPLACEMENT N/A 04/06/2020   Procedure: AORTIC VALVE REPLACEMENT (AVR) USING INSPIRIS 21 MM AORTIC VALVE;  Surgeon: [name], MD;  Location: [facility];  Service: Open Heart Surgery;  Laterality: N/A;   BIOPSY  04/12/2019    Procedure: BIOPSY;  Surgeon: Thornton Park, MD;  [facility];  Service: Gastroenterology;;   CARDIAC CATHETERIZATION     ESOPHAGEAL MANOMETRY N/A 07/15/2019   Procedure: ESOPHAGEAL MANOMETRY (EM);  Surgeon: Mauri Pole, MD;  [facility];  Service: Endoscopy;  Laterality: N/A;   ESOPHAGOGASTRODUODENOSCOPY (EGD) WITH PROPOFOL N/A 04/12/2019   Procedure: ESOPHAGOGASTRODUODENOSCOPY (EGD) WITH PROPOFOL;  Surgeon: Thornton Park, MD;  [facility];  Service: Gastroenterology;  Laterality: N/A;   gastric sleeve  09/04/2010   bariatric surgery Dr Evorn Gong    LAPAROSCOPIC TUBAL LIGATION  12/09/2010   Procedure: LAPAROSCOPIC TUBAL LIGATION;  Surgeon: Darlyn Chamber;  Location: [facility];  Service: Gynecology;  Laterality: Bilateral;  Attempted see Nursing note   pleurocentesis  10/04/2004   Pleuryx catheter placement  10/04/2009   VanTright----removed 9/11   RENAL BIOPSY  09/24/2012   RIGHT/LEFT HEART CATH AND CORONARY ANGIOGRAPHY N/A 01/19/2020   Procedure: RIGHT/LEFT HEART CATH AND CORONARY ANGIOGRAPHY;  Surgeon: Lorretta Harp, MD;  Location: [facility];  Service: Cardiovascular;  Laterality: N/A;   svd     x 3   TEE WITHOUT CARDIOVERSION N/A 04/06/2020   Procedure: TRANSESOPHAGEAL ECHOCARDIOGRAM (TEE);  Surgeon: [name], MD;  Location: [facility];  Service: Open Heart Surgery;  Laterality: N/A;   THORACENTESIS  02/04/2008   with penumonia   WISDOM TOOTH EXTRACTION      SOCIAL HISTORY: Social History   Socioeconomic History   Marital status: Divorced    Spouse name: Not on file   Number of children: 3   Years of education: 12   Highest education level: Some college, no degree  Occupational History   Occupation: Clinical research associate at [employer]   Smoking status: Former    Packs/day: 0.50    Years: 10.00    Total pack years: 5.00    Types: Cigarettes    Quit date: 05/04/2009    Years since quitting: 12.4   Smokeless  tobacco: Never   Tobacco comments:    Socially x10 years (1 pack/month)  Vaping Use   Vaping Use: Never used  Substance and Sexual Activity   Alcohol use: Not Currently    Alcohol/week: 0.0 standard drinks of alcohol    Comment: rare   Drug use: No   Sexual activity: Not on file  Other Topics Concern   Not on file  Social History Narrative   Not on file   Social Determinants of Health   Financial Resource Strain: Not on file  Food Insecurity: Not on file  Transportation Needs: Not on file  Physical Activity: Not on file  Stress: Not on file  Social Connections: Not on file  Intimate Partner Violence: Not on file    FAMILY HISTORY: Family History  Problem Relation Age of Onset   Lupus Sister    Hypertension Father    Hyperlipidemia Father    Leukemia Sister  Diabetes Other        GM   Colon cancer Neg Hx    Esophageal cancer Neg Hx    Pancreatic cancer Neg Hx     ALLERGIES:  is allergic to lisinopril, lovenox [enoxaparin sodium], moxifloxacin, and lovenox [enoxaparin].  MEDICATIONS:  Current Outpatient Medications  Medication Sig Dispense Refill   albuterol (VENTOLIN HFA) 108 (90 Base) MCG/ACT inhaler Inhale 1-2 puffs into the lungs every 6 (six) hours as needed for wheezing or shortness of breath. 6.7 g 0   amoxicillin (AMOXIL) 500 MG tablet TAKE 4 TABS (2000 MG) 30-60 MIN BEFORE DENTAL PROCEDURES 4 tablet 1   aspirin EC 81 MG EC tablet Take 1 tablet (81 mg total) by mouth daily. Swallow whole. 30 tablet 11   bismuth subsalicylate (PEPTO BISMOL) 262 MG/15ML suspension Take 30 mLs by mouth every 6 (six) hours as needed for indigestion or diarrhea or loose stools.     calcium carbonate (TUMS - DOSED IN MG ELEMENTAL CALCIUM) 500 MG chewable tablet Chew 1-2 tablets by mouth 3 (three) times daily as needed for indigestion or heartburn.     cyanocobalamin (,VITAMIN B-12,) 1000 MCG/ML injection Inject 1,000 mcg into the muscle every 30 (thirty) days.      ezetimibe  (ZETIA) 10 MG tablet Take 1 tablet (10 mg total) by mouth daily. 30 tablet 4   hydroxychloroquine (PLAQUENIL) 200 MG tablet Take 200 mg by mouth 2 (two) times daily.     levothyroxine (SYNTHROID) 200 MCG tablet TAKE 1 TABLET (200 MCG TOTAL) BY MOUTH DAILY BEFORE BREAKFAST. 90 tablet 0   metoprolol tartrate (LOPRESSOR) 50 MG tablet Take 1 tablet (50 mg total) by mouth 2 (two) times daily. 180 tablet 1   Multiple Vitamins-Minerals (BARIATRIC MULTIVITAMINS/IRON) CAPS Take 1 tablet by mouth daily.     omeprazole (PRILOSEC) 40 MG capsule Take 1 capsule (40 mg total) by mouth daily. 90 capsule 3   PARoxetine (PAXIL) 40 MG tablet TAKE 1 TABLET BY MOUTH EVERY DAY IN THE MORNING 90 tablet 1   rosuvastatin (CRESTOR) 40 MG tablet Take 1 tablet (40 mg total) by mouth daily. KEEP UPCOMING APPOINTMENT FOR FUTURE REFILLS. 30 tablet 0   warfarin (COUMADIN) 7.5 MG tablet TAKE 1 1/2 TABLETS (11.25MG) DAILY BY MOUTH EXCEPT TAKE 1 TABLET (7.5MG) ON WEDNESDAYS OR AS DIRECTED BY ANTICOAGULATION CLINIC 135 tablet 1   No current facility-administered medications for this visit.    PHYSICAL EXAMINATION:  ECOG PERFORMANCE STATUS: 0 - Asymptomatic  Vitals:   09/30/21 0911  BP: 131/71  Pulse: 85  Resp: 18  Temp: 97.8 F (36.6 C)  SpO2: 94%   Filed Weights   09/30/21 0911  Weight: (!) 330 lb 3.2 oz (149.8 kg)   Physical Exam Constitutional:      Appearance: Normal appearance.  HENT:     Head: Normocephalic and atraumatic.  Cardiovascular:     Rate and Rhythm: Normal rate and regular rhythm.  Pulmonary:     Effort: Pulmonary effort is normal.     Breath sounds: Normal breath sounds.  Abdominal:     General: Abdomen is flat.     Palpations: Abdomen is soft.  Musculoskeletal:        General: No swelling or tenderness.     Cervical back: Normal range of motion and neck supple. No rigidity.  Lymphadenopathy:     Cervical: No cervical adenopathy.  Skin:    General: Skin is warm and dry.     Comments:  Chronic venous  stasis changes. Post phlebitic changes right leg.  Neurological:     General: No focal deficit present.     Mental Status: She is alert.    LABORATORY DATA:  I have reviewed the data as listed Lab Results  Component Value Date   WBC 4.4 08/26/2021   HGB 13.4 08/26/2021   HCT 44.0 08/26/2021   MCV 82.7 08/26/2021   PLT 166 08/26/2021     Chemistry      Component Value Date/Time   NA 139 08/26/2021 1113   NA 140 12/06/2012 1444   K 4.5 08/26/2021 1113   K 5.1 12/06/2012 1444   CL 103 08/26/2021 1113   CO2 27 08/26/2021 1113   CO2 23 12/06/2012 1444   BUN 16 08/26/2021 1113   BUN 21.4 12/06/2012 1444   CREATININE 1.05 (H) 08/26/2021 1113   CREATININE 1.1 12/06/2012 1444      Component Value Date/Time   CALCIUM 9.8 08/26/2021 1113   CALCIUM 8.7 12/06/2012 1444   ALKPHOS 79 08/26/2021 1113   ALKPHOS 57 12/06/2012 1444   AST 26 08/26/2021 1113   AST 24 12/06/2012 1444   ALT 20 08/26/2021 1113   ALT 13 12/06/2012 1444   BILITOT 0.4 08/26/2021 1113   BILITOT <0.20 12/06/2012 1444     CBC, Iron panel and ferritin done on 04/02/2021, no anemia noted. Iron binding panel normal Ferritin 82  RADIOGRAPHIC STUDIES: I have personally reviewed the radiological images as listed and agreed with the findings in the report. No results found.  All questions were answered. The patient knows to call the clinic with any problems, questions or concerns. I spent 20 minutes in the care of this patient including H and P, review of records, counseling and coordination of care.    Benay Pike, MD 09/30/2021 9:34 AM

## 2021-10-01 NOTE — Telephone Encounter (Signed)
No recent or future appts., please advise  

## 2021-10-01 NOTE — Telephone Encounter (Signed)
Please schedule annual exam in sept and refill until then

## 2021-10-02 NOTE — Telephone Encounter (Signed)
Med refilled once will route to front office to schedule appt

## 2021-10-03 NOTE — Telephone Encounter (Signed)
Patient scheduled.

## 2021-10-15 ENCOUNTER — Encounter: Payer: Self-pay | Admitting: Internal Medicine

## 2021-10-15 ENCOUNTER — Telehealth: Payer: Self-pay | Admitting: Family Medicine

## 2021-10-15 ENCOUNTER — Ambulatory Visit: Payer: Managed Care, Other (non HMO) | Attending: Internal Medicine | Admitting: Internal Medicine

## 2021-10-15 ENCOUNTER — Ambulatory Visit: Payer: Managed Care, Other (non HMO)

## 2021-10-15 VITALS — BP 118/84 | HR 76 | Ht 64.0 in | Wt 336.6 lb

## 2021-10-15 DIAGNOSIS — I359 Nonrheumatic aortic valve disorder, unspecified: Secondary | ICD-10-CM

## 2021-10-15 DIAGNOSIS — I350 Nonrheumatic aortic (valve) stenosis: Secondary | ICD-10-CM | POA: Diagnosis not present

## 2021-10-15 DIAGNOSIS — E538 Deficiency of other specified B group vitamins: Secondary | ICD-10-CM

## 2021-10-15 DIAGNOSIS — E7800 Pure hypercholesterolemia, unspecified: Secondary | ICD-10-CM

## 2021-10-15 DIAGNOSIS — I10 Essential (primary) hypertension: Secondary | ICD-10-CM

## 2021-10-15 DIAGNOSIS — Z7901 Long term (current) use of anticoagulants: Secondary | ICD-10-CM

## 2021-10-15 DIAGNOSIS — E890 Postprocedural hypothyroidism: Secondary | ICD-10-CM

## 2021-10-15 DIAGNOSIS — R7303 Prediabetes: Secondary | ICD-10-CM

## 2021-10-15 LAB — POCT INR: INR: 4.1 — AB (ref 2.0–3.0)

## 2021-10-15 NOTE — Progress Notes (Signed)
Hold dose today and then change weekly dose to take 1 1/2 tablets daily except take 1 tablet on Wednesdays.  Recheck in 3 weeks.

## 2021-10-15 NOTE — Patient Instructions (Addendum)
Pre visit review using our clinic review tool, if applicable. No additional management support is needed unless otherwise documented below in the visit note.   Hold dose today and then change weekly dose to take 1 1/2 tablets daily except take 1 tablet on Wednesdays.  Recheck in 3 weeks.

## 2021-10-15 NOTE — Patient Instructions (Signed)
Medication Instructions:   *If you need a refill on your cardiac medications before your next appointment, please call your pharmacy*   Lab Work: pro bnp at Dr Lucretia Roers office   If you have labs (blood work) drawn today and your tests are completely normal, you will receive your results only by: MyChart Message (if you have MyChart) OR A paper copy in the mail If you have any lab test that is abnormal or we need to change your treatment, we will call you to review the results.   Testing/Procedures: Your physician has requested that you have an echocardiogram. Echocardiography is a painless test that uses sound waves to create images of your heart. It provides your doctor with information about the size and shape of your heart and how well your heart's chambers and valves are working. This procedure takes approximately one hour. There are no restrictions for this procedure.    Follow-Up: At [facility], you and your health needs are our priority.  As part of our continuing mission to provide you with exceptional heart care, we have created designated Provider Care Teams.  These Care Teams include your primary Cardiologist (physician) and Advanced Practice Providers (APPs -  Physician Assistants and Nurse Practitioners) who all work together to provide you with the care you need, when you need it.  We recommend signing up for the patient portal called "MyChart".  Sign up information is provided on this After Visit Summary.  MyChart is used to connect with patients for Virtual Visits (Telemedicine).  Patients are able to view lab/test results, encounter notes, upcoming appointments, etc.  Non-urgent messages can be sent to your provider as well.   To learn more about what you can do with MyChart, go to [email or web address].    Your next appointment:   6 month(s)  The format for your next appointment:   In Person  Provider:   Dietrich Pates, MD     Other  Instructions   Important Information About Sugar

## 2021-10-15 NOTE — Telephone Encounter (Signed)
-----   Message from Alvina Chou sent at 10/04/2021 12:41 PM EDT ----- Regarding: Lab orders for Wednesday, 9.13.23 Patient is scheduled for CPX labs, please order future labs, Thanks , Camelia Eng

## 2021-10-15 NOTE — Progress Notes (Addendum)
Cardiology Office Note   Date:  10/15/2021   ID:  [name], DOB 09/05/1965, MRN [id number]  PCP:  Abner Greenspan, MD  Cardiologist:   [name], MD    Pt presents for f/u of AV dz and HTN   History of Present Illness: [name] is a 56 y.o. female with a history of HTN, HL, OSA, lupus,, Factor V Leiden, DVT/PE, L Lung empyema, intracerbral hemorrhage   \ She presented to [facility] in Dec 2021 for CP and increased SOB   Pt underwent L heart cath which showed normal coronary arteries.   Mean gradient across AV was 52 mm.  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  Post op had atrial fibrillation   Placed on amiodarone The pt was seen in f/u by [facility] n March.  Doing well   Echo in APril 2022 showed prosthesis functioning well   Mean gradient 17 mm Hg.  I saw the pt in Oct 2022  The pt says she had had more SOB    Says she feels bloated  She denies CP   No palpitations She also notes occasional "spells"  She has a hard time describing what "spells" are but says she feels a little out of it.  She does say that she had this sensation prior to her TAVR   No outpatient medications have been marked as taking for the 10/15/21 encounter (Office Visit) with [name], MD.     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    Loculated-chronic on Left-VanTright; s/p VATS 5/10   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   GERD (gastroesophageal  reflux disease)    on prilosec r/t gastric sleeve surgery   HLD (hyperlipidemia)    Iron deficiency anemia    IV dextran Coladonato   Nephritis and nephropathy, not specified as acute or chronic, with unspecified pathological lesion in kidney    Nonspecific abnormal results of liver function study    Obesity, unspecified    Other specified acquired hypothyroidism    Panic disorder without agoraphobia    Personal history of venous thrombosis and embolism 2002   during pregnancy; ?factor 5 leiden (sees heme)   Pleural effusion 2005   c/w lupus initial w/u; recurrent Right as pred tapered off July 2011   Sleep apnea    Systemic lupus erythematosus ([place])    renal GN, hx of pericardial eff in late 90's   Unspecified essential hypertension     Past Surgical History:  Procedure Laterality Date   AORTIC VALVE REPLACEMENT N/A 04/06/2020   Procedure: AORTIC VALVE REPLACEMENT (AVR) USING INSPIRIS 21 MM AORTIC VALVE;  Surgeon: [name], MD;  Location: [facility];  Service: Open Heart Surgery;  Laterality: N/A;   BIOPSY  04/12/2019   Procedure: BIOPSY;  Surgeon: Thornton Park, MD;  [facility];  Service: Gastroenterology;;   CARDIAC CATHETERIZATION     ESOPHAGEAL MANOMETRY N/A 07/15/2019   Procedure: ESOPHAGEAL MANOMETRY (EM);  Surgeon: Mauri Pole, MD;  [facility];  Service: Endoscopy;  Laterality: N/A;   ESOPHAGOGASTRODUODENOSCOPY (EGD) WITH PROPOFOL N/A 04/12/2019   Procedure: ESOPHAGOGASTRODUODENOSCOPY (EGD) WITH PROPOFOL;  Surgeon: Thornton Park, MD;  [facility];  Service: Gastroenterology;  Laterality: N/A;   gastric sleeve  09/04/2010   bariatric surgery Dr Evorn Gong    LAPAROSCOPIC TUBAL LIGATION  12/09/2010   Procedure: LAPAROSCOPIC TUBAL LIGATION;  Surgeon: Darlyn Chamber;  Location: [facility];  Service: Gynecology;  Laterality: Bilateral;  Attempted see Nursing note   pleurocentesis  10/04/2004   Pleuryx catheter placement  10/04/2009    VanTright----removed 9/11   RENAL BIOPSY  09/24/2012   RIGHT/LEFT HEART CATH AND CORONARY ANGIOGRAPHY N/A 01/19/2020   Procedure: RIGHT/LEFT HEART CATH AND CORONARY ANGIOGRAPHY;  Surgeon: Lorretta Harp, MD;  Location: [facility];  Service: Cardiovascular;  Laterality: N/A;   svd     x 3   TEE WITHOUT CARDIOVERSION N/A 04/06/2020   Procedure: TRANSESOPHAGEAL ECHOCARDIOGRAM (TEE);  Surgeon: [name], MD;  Location: [facility];  Service: Open Heart Surgery;  Laterality: N/A;   THORACENTESIS  02/04/2008   with penumonia   WISDOM TOOTH EXTRACTION       Social History:  The patient  reports that she quit smoking about 12 years ago. Her smoking use included cigarettes. She has a 5.00 pack-year smoking history. She has never used smokeless tobacco. She reports that she does not currently use alcohol. She reports that she does not use drugs.   Family History:  [name] family history includes Diabetes in an other family member; Hyperlipidemia in her father; Hypertension in her father; Leukemia in her sister; Lupus in her sister.    ROS:  Please see the history of present illness. All other systems are reviewed and  Negative to the above problem except as noted.    PHYSICAL EXAM: VS:  BP 118/84 (BP Location: Left Arm, Patient Position: Sitting, Cuff Size: Large)   Pulse 76   Ht _0  (1.626 m)   Wt (!) 336 lb 9.6 oz (152.7 kg)   LMP 11/08/2010   SpO2 94%   BMI 57.78 kg/m   GEN: Morbidly obese 56 yo in no acute distress  HEENT: normal  Neck: no JVD, no bruits Cardiac: RRR; Gr III/VI systolic murmur base  Tr LE  edema  Respiratory:  clear to auscultation bilaterally,  GI: soft, nontender, nondistended, + BS  No hepatomegaly  MS: no deformity Moving all extremities   Skin: warm and dry, no rash Neuro:  Strength and sensation are intact Psych: euthymic mood, full affect   EKG:  EKG is not ordered today.   Lipid Panel    Component Value Date/Time   CHOL 163  09/07/2020 0750   TRIG 79 09/07/2020 0750   HDL 57 09/07/2020 0750   CHOLHDL 2.9 09/07/2020 0750   CHOLHDL 4 01/04/2019 1615   VLDL 24.8 01/04/2019 1615   LDLCALC 91 09/07/2020 0750   LDLDIRECT 203.7 06/03/2011 1433      Wt Readings from Last 3 Encounters:  10/15/21 (!) 336 lb 9.6 oz (152.7 kg)  09/30/21 (!) 330 lb 3.2 oz (149.8 kg)  08/26/21 (!) 321 lb (145.6 kg)      ASSESSMENT AND PLAN:  1  AV dz   Pt is s/p AVR in March 2022  with 21 mm bioprosthesis.   She complains of more SOB and "bloating"  Will repeat echo to reevaluate valve     Check BMET and BNP    2  "Spells"  Not clear what these represent   Follow   Get echo     3  Hx Facto V Leiden   COntinue on warfarin    INR followed in clinic 4  HTN  BP is well controlled  5  Hx SLE    Continue Plaquenil  6   HL  Keep on Crestor  LDL 91, HDL 57 (Aug 2022)  7  Afib  Only documentations was post op   Follow   8  Anemia  Follows in heme  Last Hgb 12 in Aug 2023     Plan for f/u in the winter    Current medicines are reviewed at length with the patient today.  The patient does not have concerns regarding medicines.  Signed, [name], MD  10/15/2021 7:24 PM    [address], [place], [place]  [address] Phone: [phone]; Fax: [phone]

## 2021-10-16 ENCOUNTER — Other Ambulatory Visit (INDEPENDENT_AMBULATORY_CARE_PROVIDER_SITE_OTHER): Payer: Managed Care, Other (non HMO)

## 2021-10-16 DIAGNOSIS — I10 Essential (primary) hypertension: Secondary | ICD-10-CM | POA: Diagnosis not present

## 2021-10-16 DIAGNOSIS — E7800 Pure hypercholesterolemia, unspecified: Secondary | ICD-10-CM | POA: Diagnosis not present

## 2021-10-16 DIAGNOSIS — E538 Deficiency of other specified B group vitamins: Secondary | ICD-10-CM

## 2021-10-16 DIAGNOSIS — E890 Postprocedural hypothyroidism: Secondary | ICD-10-CM | POA: Diagnosis not present

## 2021-10-16 DIAGNOSIS — R7303 Prediabetes: Secondary | ICD-10-CM | POA: Diagnosis not present

## 2021-10-16 LAB — CBC WITH DIFFERENTIAL/PLATELET
Basophils Absolute: 0 10*3/uL (ref 0.0–0.1)
Basophils Relative: 0.9 % (ref 0.0–3.0)
Eosinophils Absolute: 0.3 10*3/uL (ref 0.0–0.7)
Eosinophils Relative: 6.9 % — ABNORMAL HIGH (ref 0.0–5.0)
HCT: 36.6 % (ref 36.0–46.0)
Hemoglobin: 11.8 g/dL — ABNORMAL LOW (ref 12.0–15.0)
Lymphocytes Relative: 20.3 % (ref 12.0–46.0)
Lymphs Abs: 0.8 10*3/uL (ref 0.7–4.0)
MCHC: 32.4 g/dL (ref 30.0–36.0)
MCV: 79.6 fl (ref 78.0–100.0)
Monocytes Absolute: 0.3 10*3/uL (ref 0.1–1.0)
Monocytes Relative: 7.8 % (ref 3.0–12.0)
Neutro Abs: 2.5 10*3/uL (ref 1.4–7.7)
Neutrophils Relative %: 64.1 % (ref 43.0–77.0)
Platelets: 135 10*3/uL — ABNORMAL LOW (ref 150.0–400.0)
RBC: 4.59 Mil/uL (ref 3.87–5.11)
RDW: 16.6 % — ABNORMAL HIGH (ref 11.5–15.5)
WBC: 3.9 10*3/uL — ABNORMAL LOW (ref 4.0–10.5)

## 2021-10-16 LAB — HEMOGLOBIN A1C: Hgb A1c MFr Bld: 6.3 % (ref 4.6–6.5)

## 2021-10-16 LAB — LIPID PANEL
Cholesterol: 144 mg/dL (ref 0–200)
HDL: 51.3 mg/dL (ref >39.00)
LDL Cholesterol: 71 mg/dL (ref 0–99)
NonHDL: 92.89
Total CHOL/HDL Ratio: 3
Triglycerides: 107 mg/dL (ref 0.0–149.0)
VLDL: 21.4 mg/dL (ref 0.0–40.0)

## 2021-10-16 LAB — COMPREHENSIVE METABOLIC PANEL
ALT: 21 U/L (ref 0–35)
AST: 26 U/L (ref 0–37)
Albumin: 3.5 g/dL (ref 3.5–5.2)
Alkaline Phosphatase: 74 U/L (ref 39–117)
BUN: 14 mg/dL (ref 6–23)
CO2: 31 mEq/L (ref 19–32)
Calcium: 9.3 mg/dL (ref 8.4–10.5)
Chloride: 105 mEq/L (ref 96–112)
Creatinine, Ser: 1 mg/dL (ref 0.40–1.20)
GFR: 63.56 mL/min (ref >60.00)
Glucose, Bld: 94 mg/dL (ref 70–99)
Potassium: 4.3 mEq/L (ref 3.5–5.1)
Sodium: 141 mEq/L (ref 135–145)
Total Bilirubin: 0.4 mg/dL (ref 0.2–1.2)
Total Protein: 6.6 g/dL (ref 6.0–8.3)

## 2021-10-16 LAB — TSH: TSH: 0.34 u[IU]/mL — ABNORMAL LOW (ref 0.35–5.50)

## 2021-10-16 LAB — VITAMIN B12: Vitamin B-12: 830 pg/mL (ref 211–911)

## 2021-10-18 ENCOUNTER — Other Ambulatory Visit: Payer: Self-pay | Admitting: Internal Medicine

## 2021-10-18 MED ORDER — ROSUVASTATIN CALCIUM 40 MG PO TABS: 40.0000 mg | ORAL_TABLET | Freq: Every day | ORAL | 3 refills | Status: DC

## 2021-10-23 ENCOUNTER — Ambulatory Visit (INDEPENDENT_AMBULATORY_CARE_PROVIDER_SITE_OTHER): Payer: Managed Care, Other (non HMO) | Admitting: Family Medicine

## 2021-10-23 ENCOUNTER — Encounter: Payer: Self-pay | Admitting: Family Medicine

## 2021-10-23 VITALS — BP 124/76 | HR 78 | Temp 97.8°F | Ht 63.5 in | Wt 331.0 lb

## 2021-10-23 DIAGNOSIS — G4733 Obstructive sleep apnea (adult) (pediatric): Secondary | ICD-10-CM

## 2021-10-23 DIAGNOSIS — E538 Deficiency of other specified B group vitamins: Secondary | ICD-10-CM

## 2021-10-23 DIAGNOSIS — E890 Postprocedural hypothyroidism: Secondary | ICD-10-CM | POA: Diagnosis not present

## 2021-10-23 DIAGNOSIS — Z0000 Encounter for general adult medical examination without abnormal findings: Secondary | ICD-10-CM

## 2021-10-23 DIAGNOSIS — R7303 Prediabetes: Secondary | ICD-10-CM

## 2021-10-23 DIAGNOSIS — I10 Essential (primary) hypertension: Secondary | ICD-10-CM | POA: Diagnosis not present

## 2021-10-23 DIAGNOSIS — M3213 Lung involvement in systemic lupus erythematosus: Secondary | ICD-10-CM

## 2021-10-23 DIAGNOSIS — E7800 Pure hypercholesterolemia, unspecified: Secondary | ICD-10-CM

## 2021-10-23 DIAGNOSIS — R0989 Other specified symptoms and signs involving the circulatory and respiratory systems: Secondary | ICD-10-CM

## 2021-10-23 DIAGNOSIS — N182 Chronic kidney disease, stage 2 (mild): Secondary | ICD-10-CM

## 2021-10-23 DIAGNOSIS — Z9989 Dependence on other enabling machines and devices: Secondary | ICD-10-CM

## 2021-10-23 DIAGNOSIS — D509 Iron deficiency anemia, unspecified: Secondary | ICD-10-CM

## 2021-10-23 MED ORDER — PAROXETINE HCL 40 MG PO TABS: ORAL_TABLET | ORAL | 3 refills | Status: DC

## 2021-10-23 MED ORDER — EZETIMIBE 10 MG PO TABS
10.0000 mg | ORAL_TABLET | Freq: Every day | ORAL | 3 refills | Status: DC
Start: 2021-10-23 — End: 2023-01-05

## 2021-10-23 MED ORDER — LEVOTHYROXINE SODIUM 175 MCG PO TABS: 175.0000 ug | ORAL_TABLET | Freq: Every day | ORAL | 3 refills | Status: DC

## 2021-10-23 MED ORDER — OMEPRAZOLE 40 MG PO CPDR: 40.0000 mg | DELAYED_RELEASE_CAPSULE | Freq: Every day | ORAL | 3 refills | Status: DC

## 2021-10-23 NOTE — Assessment & Plan Note (Signed)
Disc goals for lipids and reasons to control them Rev last labs with pt Rev low sat fat diet in detail  Fair control with crestor 40 mg and zetia 10  Diet is not always optimal

## 2021-10-23 NOTE — Assessment & Plan Note (Signed)
Lab Results  Component Value Date   [id number] 902 10/16/2021   Past bariatric surgery  montlhy shots

## 2021-10-23 NOTE — Progress Notes (Signed)
Subjective:    Patient ID: [name], female    DOB: 18-Apr-1965, 56 y.o.   MRN: [id number]  HPI Here for health maintenance exam and to review chronic medical problems    Wt Readings from Last 3 Encounters:  10/23/21 (!) 331 lb (150.1 kg)  10/15/21 (!) 336 lb 9.6 oz (152.7 kg)  09/30/21 (!) 330 lb 3.2 oz (149.8 kg)   57.71 kg/m  Gastric sleeve procedure in the past   Her daughter and grand child moved out  This is both good and bad/ quiet at the house   Has mucous she cannot get out of her throat  She coughs all the time  Clear to milky in color Makes her feel a little wheezing  Happens for several weeks at a time   Has GERD-takes tums  Since her gastric surgery  Had EGD in the past - needs to get to the specialist  Takes omeprazole 40 mg daily    Immunization History  Administered Date(s) Administered   Influenza Whole 12/25/2008, 11/12/2010, 11/08/2011   Influenza,inj,Quad PF,6+ Mos 12/07/2012, 03/04/2016, 11/16/2017, 01/04/2019, 10/23/2020   Influenza-Unspecified 11/03/2013   PFIZER(Purple Top)SARS-COV-2 Vaccination 04/16/2019, 05/07/2019, 01/06/2020   Pfizer Covid-19 Vaccine Bivalent Booster 22yr & up 01/23/2021   Tdap 02/28/2014   Health Maintenance Due  Topic Date Due   Hepatitis C Screening  Never done   Zoster Vaccines- Shingrix (1 of 2) Never done   COLONOSCOPY (Pts 45-478yrInsurance coverage will need to be confirmed)  Never done   MAMMOGRAM  02/04/2019   PAP SMEAR-Modifier  02/03/2021   COVID-19 Vaccine (5 - Pfizer risk series) 03/20/2021   INFLUENZA VACCINE  09/03/2021   Flu shot - declines   Colon cancer screening -colonoscopy 2018? - thought it was 1040 recall  Had bariatric surgery    [facility]  Sees [facility]yn  Self breast exam  Dexa  normal in 2015  Takes a vitamin for bariatric surgery with vit d in it     Pap-regular at gyn  Is due to schedule annual soon  Sees gyn   HTN bp is stable  today  No cp or palpitations or headaches or edema  No side effects to medicines  BP Readings from Last 3 Encounters:  10/23/21 124/76  10/15/21 118/84  09/30/21 131/71    Pulse Readings from Last 3 Encounters:  10/23/21 78  10/15/21 76  09/30/21 85    Metoprolol 50 mg bid  Cardiology care- with valve repl in past and CAD and a fib (post op only) Nothing new  Plans echo next month   OSA- cpap in the past-has not used recently  Not snoring    SLE with nephritis / CKD Nothing new- pretty calm   Lab Results  Component Value Date   CREATININE 1.00 10/16/2021   BUN 14 10/16/2021   NA 141 10/16/2021   K 4.3 10/16/2021   CL 105 10/16/2021   CO2 31 10/16/2021   GFR over 60  Doing well  Nephrology tomorrow   Hypothyroidism  Pt has no clinical changes No change in energy level/ hair or skin/ edema and no tremor Lab Results  Component Value Date   TSH 0.34 (L) 10/16/2021    Levothyroxine 200 mcg    Amiodarone in the past   Still occ gets optical migraine  Occ dizzy spells as well /cardiology is aware (fleeting)    Hyperlipidemia Lab Results  Component Value Date  CHOL 144 10/16/2021   CHOL 163 09/07/2020   CHOL 185 07/06/2020   Lab Results  Component Value Date   HDL 51.30 10/16/2021   HDL 57 09/07/2020   HDL 57 07/06/2020   Lab Results  Component Value Date   LDLCALC 71 10/16/2021   LDLCALC 91 09/07/2020   LDLCALC 109 (H) 07/06/2020   Lab Results  Component Value Date   TRIG 107.0 10/16/2021   TRIG 79 09/07/2020   TRIG 105 07/06/2020   Lab Results  Component Value Date   CHOLHDL 3 10/16/2021   CHOLHDL 2.9 09/07/2020   CHOLHDL 3.2 07/06/2020   Lab Results  Component Value Date   LDLDIRECT 203.7 06/03/2011   LDLDIRECT 241.9 07/09/2009   LDLDIRECT 184.8 12/25/2008   Crestor 40 mg daily  Zetia 10 mg daily   Diet sucks in general  Very frustrating to her  Has a hard time eating food because of the surgery  Has vomiting frequently   Just started the daily harvest smoothies and tolerates it well   Prediabetes Lab Results  Component Value Date   HGBA1C 6.3 10/16/2021   Creeping up from 5.7   Diet is difficult and restricted Often only tolerates the simple carbs   Tries to stick with sugar free items  When daughter was there- more junk food in the house/that will be better now     Iron def anemia Lab Results  Component Value Date   WBC 3.9 (L) 10/16/2021   HGB 11.8 (L) 10/16/2021   HCT 36.6 10/16/2021   MCV 79.6 10/16/2021   PLT 135.0 (L) 10/16/2021   Lab Results  Component Value Date   IRON 42 09/30/2021   TIBC 377 09/30/2021   FERRITIN 58 09/30/2021   Getting iron infusions in past   Chronic anticoag for factor V mutation and blood clots  Takes warfarin   B12 def Lab Results  Component Value Date   [id number] 169 10/16/2021   Monthly shot Oral repl  Patient Active Problem List   Diagnosis Date Noted   Globus sensation 10/23/2021   IDA (iron deficiency anemia) 06/28/2020   Iron deficiency anemia 06/18/2020   History of atrial fibrillation 06/18/2020   S/P aortic valve replacement with bioprosthetic valve 04/06/2020   Coronary artery disease 04/06/2020   Chest pain of uncertain etiology 67/89/3810   Esophageal diverticulum    Esophageal dysphagia    Fatigue 05/20/2019   Systemic lupus erythematosus with lung involvement ([place]) 01/04/2019   Aortic stenosis 08/15/2017   Vitamin B12 deficiency 11/15/2015   Nausea & vomiting 10/15/2015   Allergic rhinitis 07/01/2015   History of cerebral hemorrhage 07/01/2015   Chronic daily headache 06/29/2015   Screening for osteoporosis 11/04/2013   Encounter for therapeutic drug monitoring 03/25/2013   CKD (chronic kidney disease) stage 2, GFR 60-89 ml/min 12/20/2012   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[place]) 12/20/2012   Routine general medical examination at a health care facility 12/07/2012   Steroid long-term use 12/07/2012   History of HPV infection  12/07/2012   Long term (current) use of anticoagulants 10/06/2012   Chronic anticoagulation 04/22/2012   S/P bariatric surgery 04/22/2012   Homozygous Factor V Leiden mutation ([place]) 04/22/2012   OSA on CPAP 11/20/2011   Prediabetes 09/16/2010   EDEMA 01/08/2010   PLEURAL EFFUSION 08/27/2009   ENLARGEMENT OF LYMPH NODES 08/13/2009   History of pulmonary embolism 05/24/2009   Morbid obesity ([place]) 12/08/2007   Hypothyroidism 06/09/2006   HYPERCHOLESTEROLEMIA 06/09/2006   Generalized anxiety disorder 06/09/2006  PANIC DISORDER 06/09/2006   Essential hypertension 06/09/2006   GLOMERULONEPHRITIS 06/09/2006   Systemic lupus erythematosus ([place]) 06/09/2006   DVT, HX OF 06/09/2006   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    Loculated-chronic on Left-VanTright; s/p VATS 5/10   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   GERD (gastroesophageal reflux disease)    on prilosec r/t gastric sleeve surgery   HLD (hyperlipidemia)    Iron deficiency anemia    IV dextran [place]   Nephritis and nephropathy, not specified as acute or chronic, with unspecified pathological lesion in kidney    Nonspecific abnormal results of liver function study    Obesity, unspecified    Other specified acquired hypothyroidism    Panic disorder without agoraphobia    Personal history of venous thrombosis and embolism 2002   during pregnancy; ?factor 5 leiden (sees heme)   Pleural effusion 2005   c/w lupus initial w/u; recurrent Right as pred tapered off July 2011   Sleep apnea    Systemic lupus erythematosus ([place])    renal GN, hx of pericardial eff in late 90's   Unspecified essential hypertension    Past Surgical History:  Procedure Laterality Date   AORTIC VALVE REPLACEMENT N/A  04/06/2020   Procedure: AORTIC VALVE REPLACEMENT (AVR) USING INSPIRIS 21 MM AORTIC VALVE;  Surgeon: [name], MD;  Location: [facility];  Service: Open Heart Surgery;  Laterality: N/A;   BIOPSY  04/12/2019   Procedure: BIOPSY;  Surgeon: Thornton Park, MD;  Location: [facility];  Service: Gastroenterology;;   CARDIAC CATHETERIZATION     ESOPHAGEAL MANOMETRY N/A 07/15/2019   Procedure: ESOPHAGEAL MANOMETRY (EM);  Surgeon: Mauri Pole, MD;  Location: [facility];  Service: Endoscopy;  Laterality: N/A;   ESOPHAGOGASTRODUODENOSCOPY (EGD) WITH PROPOFOL N/A 04/12/2019   Procedure: ESOPHAGOGASTRODUODENOSCOPY (EGD) WITH PROPOFOL;  Surgeon: Thornton Park, MD;  Location: [facility];  Service: Gastroenterology;  Laterality: N/A;   gastric sleeve  09/04/2010   bariatric surgery Dr Evorn Gong    LAPAROSCOPIC TUBAL LIGATION  12/09/2010   Procedure: LAPAROSCOPIC TUBAL LIGATION;  Surgeon: Darlyn Chamber;  Location: [facility];  Service: Gynecology;  Laterality: Bilateral;  Attempted see Nursing note   pleurocentesis  10/04/2004   Pleuryx catheter placement  10/04/2009   VanTright----removed 9/11   RENAL BIOPSY  09/24/2012   RIGHT/LEFT HEART CATH AND CORONARY ANGIOGRAPHY N/A 01/19/2020   Procedure: RIGHT/LEFT HEART CATH AND CORONARY ANGIOGRAPHY;  Surgeon: Lorretta Harp, MD;  Location: [facility];  Service: Cardiovascular;  Laterality: N/A;   svd     x 3   TEE WITHOUT CARDIOVERSION N/A 04/06/2020   Procedure: TRANSESOPHAGEAL ECHOCARDIOGRAM (TEE);  Surgeon: [name], MD;  Location: [facility];  Service: Open Heart Surgery;  Laterality: N/A;   THORACENTESIS  02/04/2008   with penumonia   WISDOM TOOTH EXTRACTION     Social History   Tobacco Use   Smoking status: Former    Packs/day: 0.50    Years: 10.00    Total pack years: 5.00    Types: Cigarettes    Quit date: 05/04/2009    Years since quitting: 12.4   Smokeless tobacco: Never   Tobacco comments:  Socially x10 years (1  pack/month)  Vaping Use   Vaping Use: Never used  Substance Use Topics   Alcohol use: Not Currently    Alcohol/week: 0.0 standard drinks of alcohol    Comment: rare   Drug use: No   Family History  Problem Relation Age of Onset   Lupus Sister    Hypertension Father    Hyperlipidemia Father    Leukemia Sister    Diabetes Other        GM   Colon cancer Neg Hx    Esophageal cancer Neg Hx    Pancreatic cancer Neg Hx    Allergies  Allergen Reactions   Lisinopril Cough   Lovenox [Enoxaparin Sodium] Itching   Moxifloxacin Other (See Comments)    REACTION: hallucinations   Lovenox [Enoxaparin]     Itching    Current Outpatient Medications on File Prior to Visit  Medication Sig Dispense Refill   albuterol (VENTOLIN HFA) 108 (90 Base) MCG/ACT inhaler Inhale 1-2 puffs into the lungs every 6 (six) hours as needed for wheezing or shortness of breath. 6.7 g 0   amoxicillin (AMOXIL) 500 MG tablet TAKE 4 TABS (2000 MG) 30-60 MIN BEFORE DENTAL PROCEDURES 4 tablet 1   aspirin EC 81 MG EC tablet Take 1 tablet (81 mg total) by mouth daily. Swallow whole. 30 tablet 11   bismuth subsalicylate (PEPTO BISMOL) 262 MG/15ML suspension Take 30 mLs by mouth every 6 (six) hours as needed for indigestion or diarrhea or loose stools.     calcium carbonate (TUMS - DOSED IN MG ELEMENTAL CALCIUM) 500 MG chewable tablet Chew 1-2 tablets by mouth 3 (three) times daily as needed for indigestion or heartburn.     cyanocobalamin (,VITAMIN B-12,) 1000 MCG/ML injection Inject 1,000 mcg into the muscle every 30 (thirty) days.      hydroxychloroquine (PLAQUENIL) 200 MG tablet Take 200 mg by mouth 2 (two) times daily.     metoprolol tartrate (LOPRESSOR) 50 MG tablet Take 1 tablet (50 mg total) by mouth 2 (two) times daily. 180 tablet 1   Multiple Vitamins-Minerals (BARIATRIC MULTIVITAMINS/IRON) CAPS Take 1 tablet by mouth daily.     rosuvastatin (CRESTOR) 40 MG tablet Take 1 tablet (40 mg total) by mouth daily. 90  tablet 3   warfarin (COUMADIN) 7.5 MG tablet TAKE 1 1/2 TABLETS (11.25MG) DAILY BY MOUTH EXCEPT TAKE 1 TABLET (7.5MG) ON WEDNESDAYS OR AS DIRECTED BY ANTICOAGULATION CLINIC 135 tablet 1   No current facility-administered medications on file prior to visit.     Review of Systems  Constitutional:  Positive for fatigue. Negative for activity change, appetite change, fever and unexpected weight change.  HENT:  Negative for congestion, ear pain, rhinorrhea, sinus pressure and sore throat.   Eyes:  Negative for pain, redness and visual disturbance.  Respiratory:  Negative for cough, shortness of breath and wheezing.   Cardiovascular:  Negative for chest pain and palpitations.       Indigestion   Gastrointestinal:  Negative for abdominal pain, blood in stool, constipation and diarrhea.  Endocrine: Negative for polydipsia and polyuria.  Genitourinary:  Negative for dysuria, frequency and urgency.  Musculoskeletal:  Positive for arthralgias. Negative for back pain and myalgias.  Skin:  Negative for pallor and rash.  Allergic/Immunologic: Negative for environmental allergies.  Neurological:  Negative for dizziness, syncope and headaches.  Hematological:  Negative for adenopathy. Does not bruise/bleed easily.  Psychiatric/Behavioral:  Negative for decreased concentration and dysphoric mood. The patient is not nervous/anxious.  Objective:   Physical Exam Constitutional:      General: She is not in acute distress.    Appearance: Normal appearance. She is well-developed. She is obese. She is not ill-appearing or diaphoretic.  HENT:     Head: Normocephalic and atraumatic.     Right Ear: Tympanic membrane, ear canal and external ear normal.     Left Ear: Tympanic membrane, ear canal and external ear normal.     Nose: Nose normal. No congestion.     Mouth/Throat:     Mouth: Mucous membranes are moist.     Pharynx: Oropharynx is clear. No posterior oropharyngeal erythema.  Eyes:      General: No scleral icterus.    Extraocular Movements: Extraocular movements intact.     Conjunctiva/sclera: Conjunctivae normal.     Pupils: Pupils are equal, round, and reactive to light.  Neck:     Thyroid: No thyromegaly.     Vascular: No carotid bruit or JVD.  Cardiovascular:     Rate and Rhythm: Normal rate and regular rhythm.     Pulses: Normal pulses.     Heart sounds: Normal heart sounds.     No gallop.  Pulmonary:     Effort: Pulmonary effort is normal. No respiratory distress.     Breath sounds: Normal breath sounds. No wheezing.     Comments: Good air exch Chest:     Chest wall: No tenderness.  Abdominal:     General: Bowel sounds are normal. There is no distension or abdominal bruit.     Palpations: Abdomen is soft. There is no mass.     Tenderness: There is no abdominal tenderness.     Hernia: No hernia is present.  Genitourinary:    Comments: Breast and pelvic exam planned with gynecologist  Musculoskeletal:        General: No tenderness. Normal range of motion.     Cervical back: Normal range of motion and neck supple. No rigidity. No muscular tenderness.     Right lower leg: No edema.     Left lower leg: No edema.     Comments: No kyphosis   Lymphadenopathy:     Cervical: No cervical adenopathy.  Skin:    General: Skin is warm and dry.     Coloration: Skin is not pale.     Findings: No erythema or rash.  Neurological:     Mental Status: She is alert. Mental status is at baseline.     Cranial Nerves: No cranial nerve deficit.     Motor: No abnormal muscle tone.     Coordination: Coordination normal.     Gait: Gait normal.     Deep Tendon Reflexes: Reflexes are normal and symmetric.  Psychiatric:        Mood and Affect: Mood normal.        Cognition and Memory: Cognition and memory normal.           Assessment & Plan:   Problem List Items Addressed This Visit       Cardiovascular and Mediastinum   Essential hypertension    bp in fair control  at this time  BP Readings from Last 1 Encounters:  10/23/21 124/76  No changes needed Most recent labs reviewed  Disc lifstyle change with low sodium diet and exercise  metoprool 50 mg bid        Relevant Medications   ezetimibe (ZETIA) 10 MG tablet     Respiratory   OSA on CPAP (Chronic)  Pt does not think a problem No tx currently       Systemic lupus erythematosus with lung involvement ([place])     Endocrine   Hypothyroidism    Lab Results  Component Value Date   TSH 0.34 (L) 10/16/2021  Taking levothy 200- will reduce to 175 mcg daily and re check in 6 wk      Relevant Medications   levothyroxine (SYNTHROID) 175 MCG tablet     Genitourinary   CKD (chronic kidney disease) stage 2, GFR 60-89 ml/min    Stable labs Good fluid intake From SLE        Other   Globus sensation    May be from GERD Also has hearburn even with ppi Taking omeprazole   Will plan f/u with GI      HYPERCHOLESTEROLEMIA    Disc goals for lipids and reasons to control them Rev last labs with pt Rev low sat fat diet in detail  Fair control with crestor 40 mg and zetia 10  Diet is not always optimal      Relevant Medications   ezetimibe (ZETIA) 10 MG tablet   IDA (iron deficiency anemia)    Under care of hematology Last hb 11.8 with nl iron and ferritin levels       Morbid obesity (HCC)    Discussed how this problem influences overall health and the risks it imposes  Reviewed plan for weight loss with lower calorie diet (via better food choices and also portion control or program like weight watchers) and exercise building up to or more than 30 minutes 5 days per week including some aerobic activity   Has had gastric sleeve procedure       Prediabetes    Lab Results  Component Value Date   HGBA1C 6.3 10/16/2021  This is up  disc imp of low glycemic diet and wt loss to prevent DM2       Routine general medical examination at a health care facility - Primary    Reviewed  health habits including diet and exercise and skin cancer prevention Reviewed appropriate screening tests for age  Also reviewed health mt list, fam hx and immunization status , as well as social and family history   See HPI Labs reviewed Declines flu shot  Declines shingrix vaccine  Sent for last pap /mam appt from gyn  dexa nl in 2015 Colonoscopy was ? 2018- pending report to estimate recall  Enc regular gyn visit Enc good diet and exercise        Vitamin B12 deficiency    Lab Results  Component Value Date   [id number] 830 10/16/2021  Past bariatric surgery  montlhy shots

## 2021-10-23 NOTE — Patient Instructions (Addendum)
For allergies- zyrtec 10 mg daily at night and flonase daily  If no improvement this can be from acid reflux also   Get back to GI about the heartburn  Make sure you get at least 2000 iu of vitamin D daily   Go down on your thyroid dose to 175 mcg daily   Take care of yourself   For diabetes prevention  Try to get most of your carbohydrates from produce (with the exception of white potatoes)  Eat less bread/pasta/rice/snack foods/cereals/sweets and other items from the middle of the grocery store (processed carbs)

## 2021-10-23 NOTE — Assessment & Plan Note (Signed)
Pt does not think a problem No tx currently

## 2021-10-23 NOTE — Assessment & Plan Note (Signed)
bp in fair control at this time  BP Readings from Last 1 Encounters:  10/23/21 124/76   No changes needed Most recent labs reviewed  Disc lifstyle change with low sodium diet and exercise  metoprool 50 mg bid

## 2021-10-23 NOTE — Assessment & Plan Note (Signed)
Under care of hematology Last hb 11.8 with nl iron and ferritin levels

## 2021-10-23 NOTE — Assessment & Plan Note (Signed)
Lab Results  Component Value Date   HGBA1C 6.3 10/16/2021   This is up  disc imp of low glycemic diet and wt loss to prevent DM2

## 2021-10-23 NOTE — Assessment & Plan Note (Signed)
Stable labs Good fluid intake From SLE

## 2021-10-23 NOTE — Assessment & Plan Note (Signed)
Reviewed health habits including diet and exercise and skin cancer prevention Reviewed appropriate screening tests for age  Also reviewed health mt list, fam hx and immunization status , as well as social and family history   See HPI Labs reviewed Declines flu shot  Declines shingrix vaccine  Sent for last pap /mam appt from gyn  dexa nl in 2015 Colonoscopy was ? 2018- pending report to estimate recall  Enc regular gyn visit Enc good diet and exercise

## 2021-10-23 NOTE — Assessment & Plan Note (Signed)
Lab Results  Component Value Date   TSH 0.34 (L) 10/16/2021   Taking levothy 200- will reduce to 175 mcg daily and re check in 6 wk

## 2021-10-23 NOTE — Assessment & Plan Note (Signed)
May be from GERD Also has hearburn even with ppi Taking omeprazole   Will plan f/u with GI

## 2021-10-23 NOTE — Assessment & Plan Note (Signed)
Discussed how this problem influences overall health and the risks it imposes  Reviewed plan for weight loss with lower calorie diet (via better food choices and also portion control or program like weight watchers) and exercise building up to or more than 30 minutes 5 days per week including some aerobic activity   Has had gastric sleeve procedure

## 2021-10-25 ENCOUNTER — Other Ambulatory Visit: Payer: Self-pay | Admitting: Family Medicine

## 2021-11-04 ENCOUNTER — Ambulatory Visit (HOSPITAL_COMMUNITY): Payer: Managed Care, Other (non HMO)

## 2021-11-05 ENCOUNTER — Ambulatory Visit: Payer: Managed Care, Other (non HMO)

## 2021-11-08 ENCOUNTER — Ambulatory Visit: Payer: Managed Care, Other (non HMO)

## 2021-11-08 DIAGNOSIS — E538 Deficiency of other specified B group vitamins: Secondary | ICD-10-CM

## 2021-11-08 DIAGNOSIS — Z7901 Long term (current) use of anticoagulants: Secondary | ICD-10-CM

## 2021-11-08 LAB — POCT INR: INR: 2.5 (ref 2.0–3.0)

## 2021-11-08 MED ORDER — CYANOCOBALAMIN 1000 MCG/ML IJ SOLN
1000.0000 ug | Freq: Once | INTRAMUSCULAR | Status: AC
  Administered 2022-03-14: 1000 ug via INTRAMUSCULAR

## 2021-11-08 NOTE — Patient Instructions (Signed)
Continue to take 1 1/2 tablets daily except take 1 tablet on Wednesdays.  Recheck in 6 weeks.

## 2021-11-08 NOTE — Progress Notes (Signed)
Continue to take 1 1/2 tablets daily except take 1 tablet on Wednesdays.  Recheck in 6 weeks.  Vitamin B12 1000 mcg/mL injection per orders of Dr. Jenny Reichmann in Dr. Marliss Coots absence administered today in right deltoid. Pt tolerated well.   Last B12 lab was 10/16/21 and result was 830 (normal range 180-914 pg/mL).

## 2021-11-19 ENCOUNTER — Ambulatory Visit (HOSPITAL_COMMUNITY): Payer: Managed Care, Other (non HMO) | Attending: Internal Medicine

## 2021-11-19 DIAGNOSIS — I359 Nonrheumatic aortic valve disorder, unspecified: Secondary | ICD-10-CM | POA: Diagnosis not present

## 2021-11-19 DIAGNOSIS — I350 Nonrheumatic aortic (valve) stenosis: Secondary | ICD-10-CM

## 2021-11-19 LAB — ECHOCARDIOGRAM COMPLETE
AV Mean grad: 18.4 mmHg
AV Peak grad: 31 mmHg
Ao pk vel: 2.79 m/s
Area-P 1/2: 3.85 cm2
S' Lateral: 2.9 cm

## 2021-12-02 ENCOUNTER — Telehealth: Payer: Self-pay | Admitting: Family Medicine

## 2021-12-02 DIAGNOSIS — E890 Postprocedural hypothyroidism: Secondary | ICD-10-CM

## 2021-12-02 NOTE — Telephone Encounter (Signed)
-----   Message from Ellamae Sia sent at 11/18/2021 11:59 AM EDT ----- Regarding: Lab orders for Tuesday, 10.31.23 Lab orders, thanks

## 2021-12-04 ENCOUNTER — Other Ambulatory Visit (INDEPENDENT_AMBULATORY_CARE_PROVIDER_SITE_OTHER): Payer: Managed Care, Other (non HMO)

## 2021-12-04 DIAGNOSIS — E890 Postprocedural hypothyroidism: Secondary | ICD-10-CM | POA: Diagnosis not present

## 2021-12-04 LAB — TSH: TSH: 0.7 u[IU]/mL (ref 0.35–5.50)

## 2021-12-10 ENCOUNTER — Encounter: Payer: Self-pay | Admitting: *Deleted

## 2021-12-20 ENCOUNTER — Ambulatory Visit: Payer: Managed Care, Other (non HMO)

## 2021-12-24 ENCOUNTER — Ambulatory Visit: Payer: Managed Care, Other (non HMO)

## 2021-12-24 DIAGNOSIS — E538 Deficiency of other specified B group vitamins: Secondary | ICD-10-CM

## 2021-12-24 DIAGNOSIS — Z7901 Long term (current) use of anticoagulants: Secondary | ICD-10-CM | POA: Diagnosis not present

## 2021-12-24 LAB — POCT INR: INR: 1.7 — AB (ref 2.0–3.0)

## 2021-12-24 MED ORDER — CYANOCOBALAMIN 1000 MCG/ML IJ SOLN
1000.0000 ug | Freq: Once | INTRAMUSCULAR | Status: AC
  Administered 2021-12-24: 1000 ug via INTRAMUSCULAR

## 2021-12-24 NOTE — Patient Instructions (Addendum)
Increase dose today to two 7.5 mg tablets (15 mg) and increase dose tomorrow to 1 1/2 tablets (11.25 mg total).  Then change weekly dose to 1 1/2 tablet daily and recheck on 12/5 at 11:00.

## 2021-12-24 NOTE — Progress Notes (Signed)
Pt reports that she has been replacing 1 -2 meals/day with shakes that contain kale and spinach.    Increase dose today to two 7.5 mg tablets (15 mg) and increase dose tomorrow to 1 1/2 tablets (11.25 mg total).  Then change weekly dose to 1 1/2 tablet daily and recheck in 2 weeks.   Vitamin B12 1000 mcg/mL injection per orders of Dr. Okey Dupre in Dr. Royden Purl absence administered today in right deltoid. Pt tolerated well.   Last B12 lab was 10/16/21 and result was 830 (normal range 180-914 pg/mL).

## 2021-12-28 ENCOUNTER — Other Ambulatory Visit: Payer: Self-pay | Admitting: Internal Medicine

## 2022-01-07 ENCOUNTER — Ambulatory Visit: Payer: Managed Care, Other (non HMO)

## 2022-01-07 DIAGNOSIS — Z7901 Long term (current) use of anticoagulants: Secondary | ICD-10-CM

## 2022-01-07 LAB — POCT INR: INR: 3.3 — AB (ref 2.0–3.0)

## 2022-01-07 NOTE — Progress Notes (Signed)
Pt reports that she has been replacing 1 -2 meals/day with shakes that contain kale and spinach.    Continue to take 1 1/2 tablet daily and recheck in 4 weeks.   Pt will need B12 injection at next apt.

## 2022-01-07 NOTE — Patient Instructions (Addendum)
Pre visit review using our clinic review tool, if applicable. No additional management support is needed unless otherwise documented below in the visit note.  Pt reports that she has been replacing 1 -2 meals/day with shakes that contain kale and spinach.    Continue to take 1 1/2 tablet daily and recheck in 4 weeks.

## 2022-02-02 IMAGING — DX DG CHEST 2V
2 series · 2 of 2 positions shown · non-contrast
Comparison: 05/17/2020

CLINICAL DATA: Cough, shortness of breath, HBTAF-OI positive

EXAM:
CHEST - 2 VIEW

[chest pa]
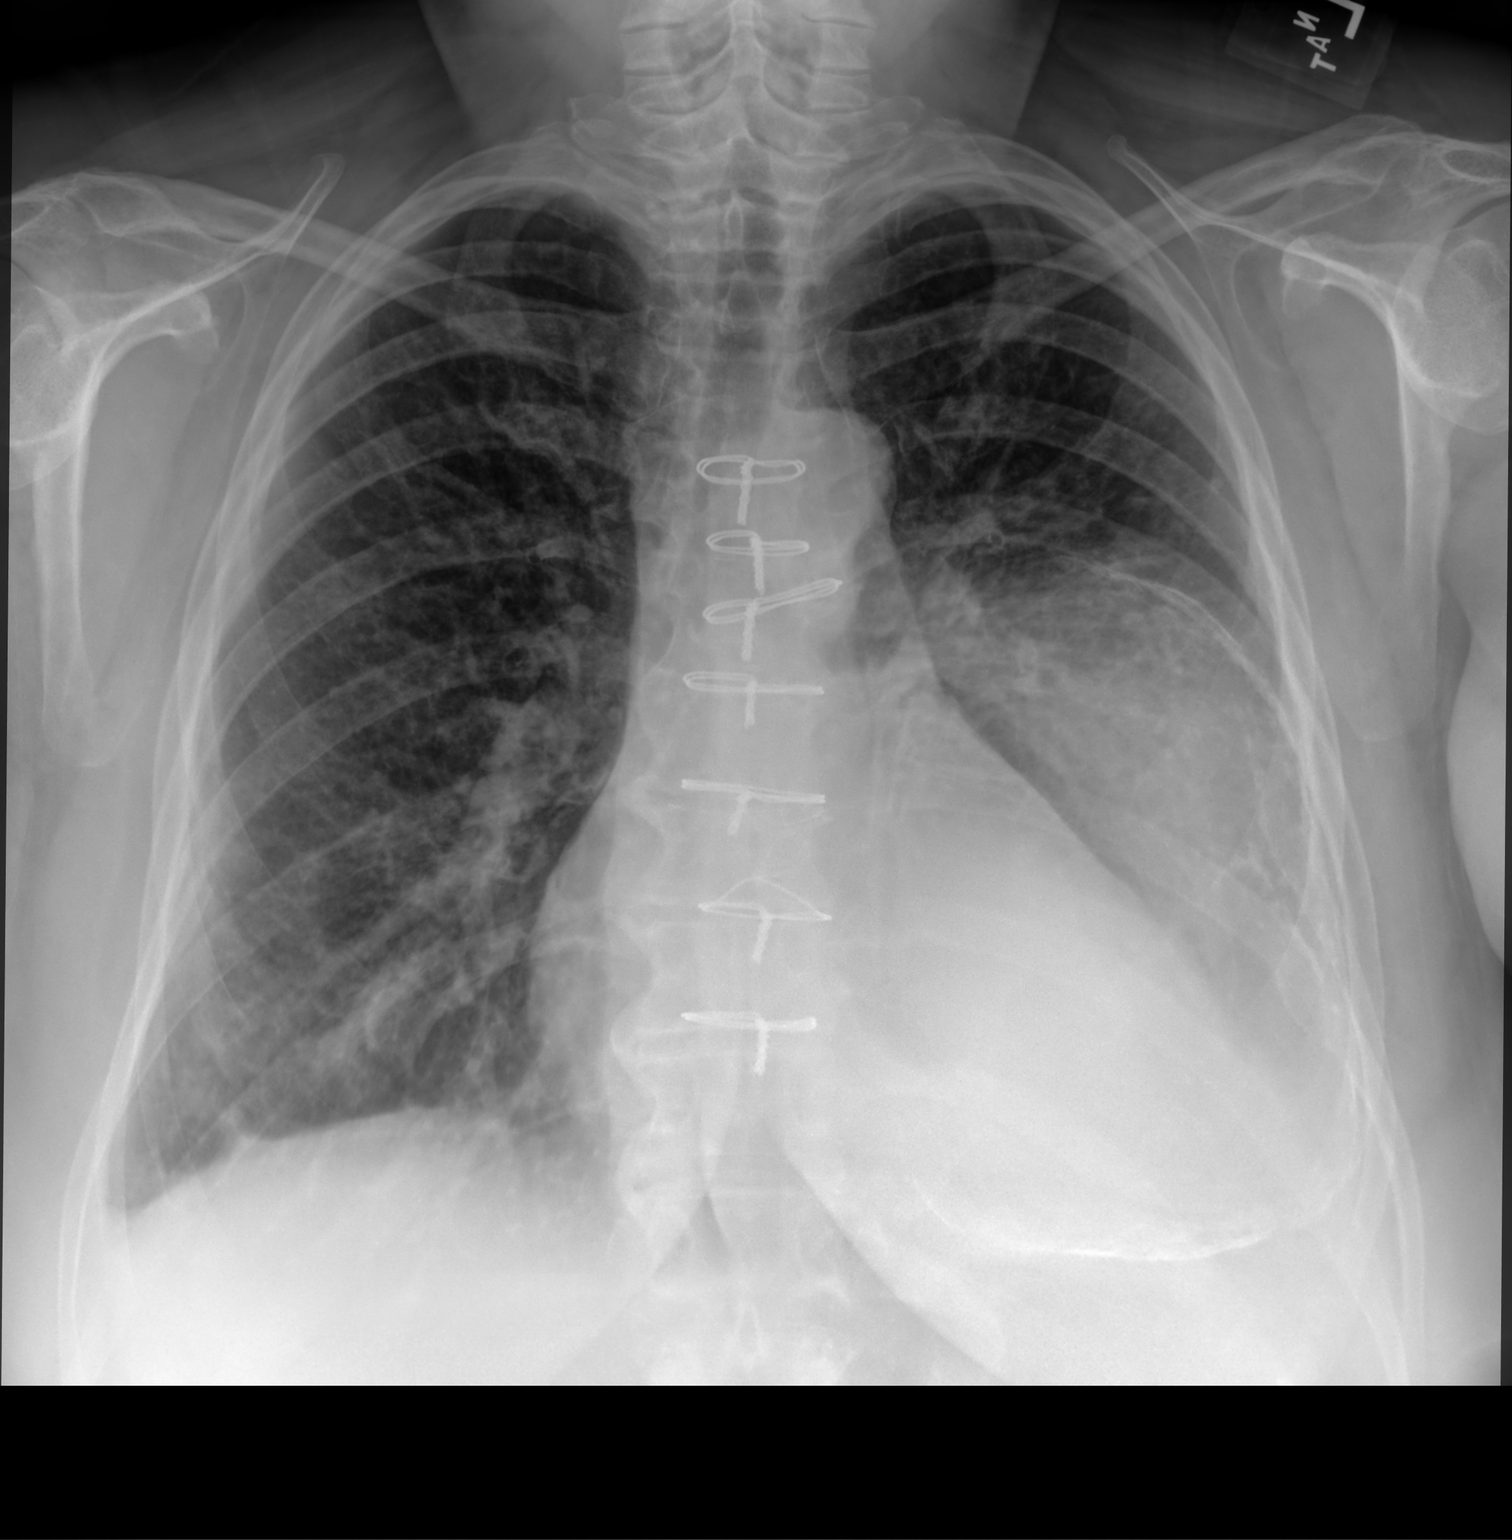

[chest lat]
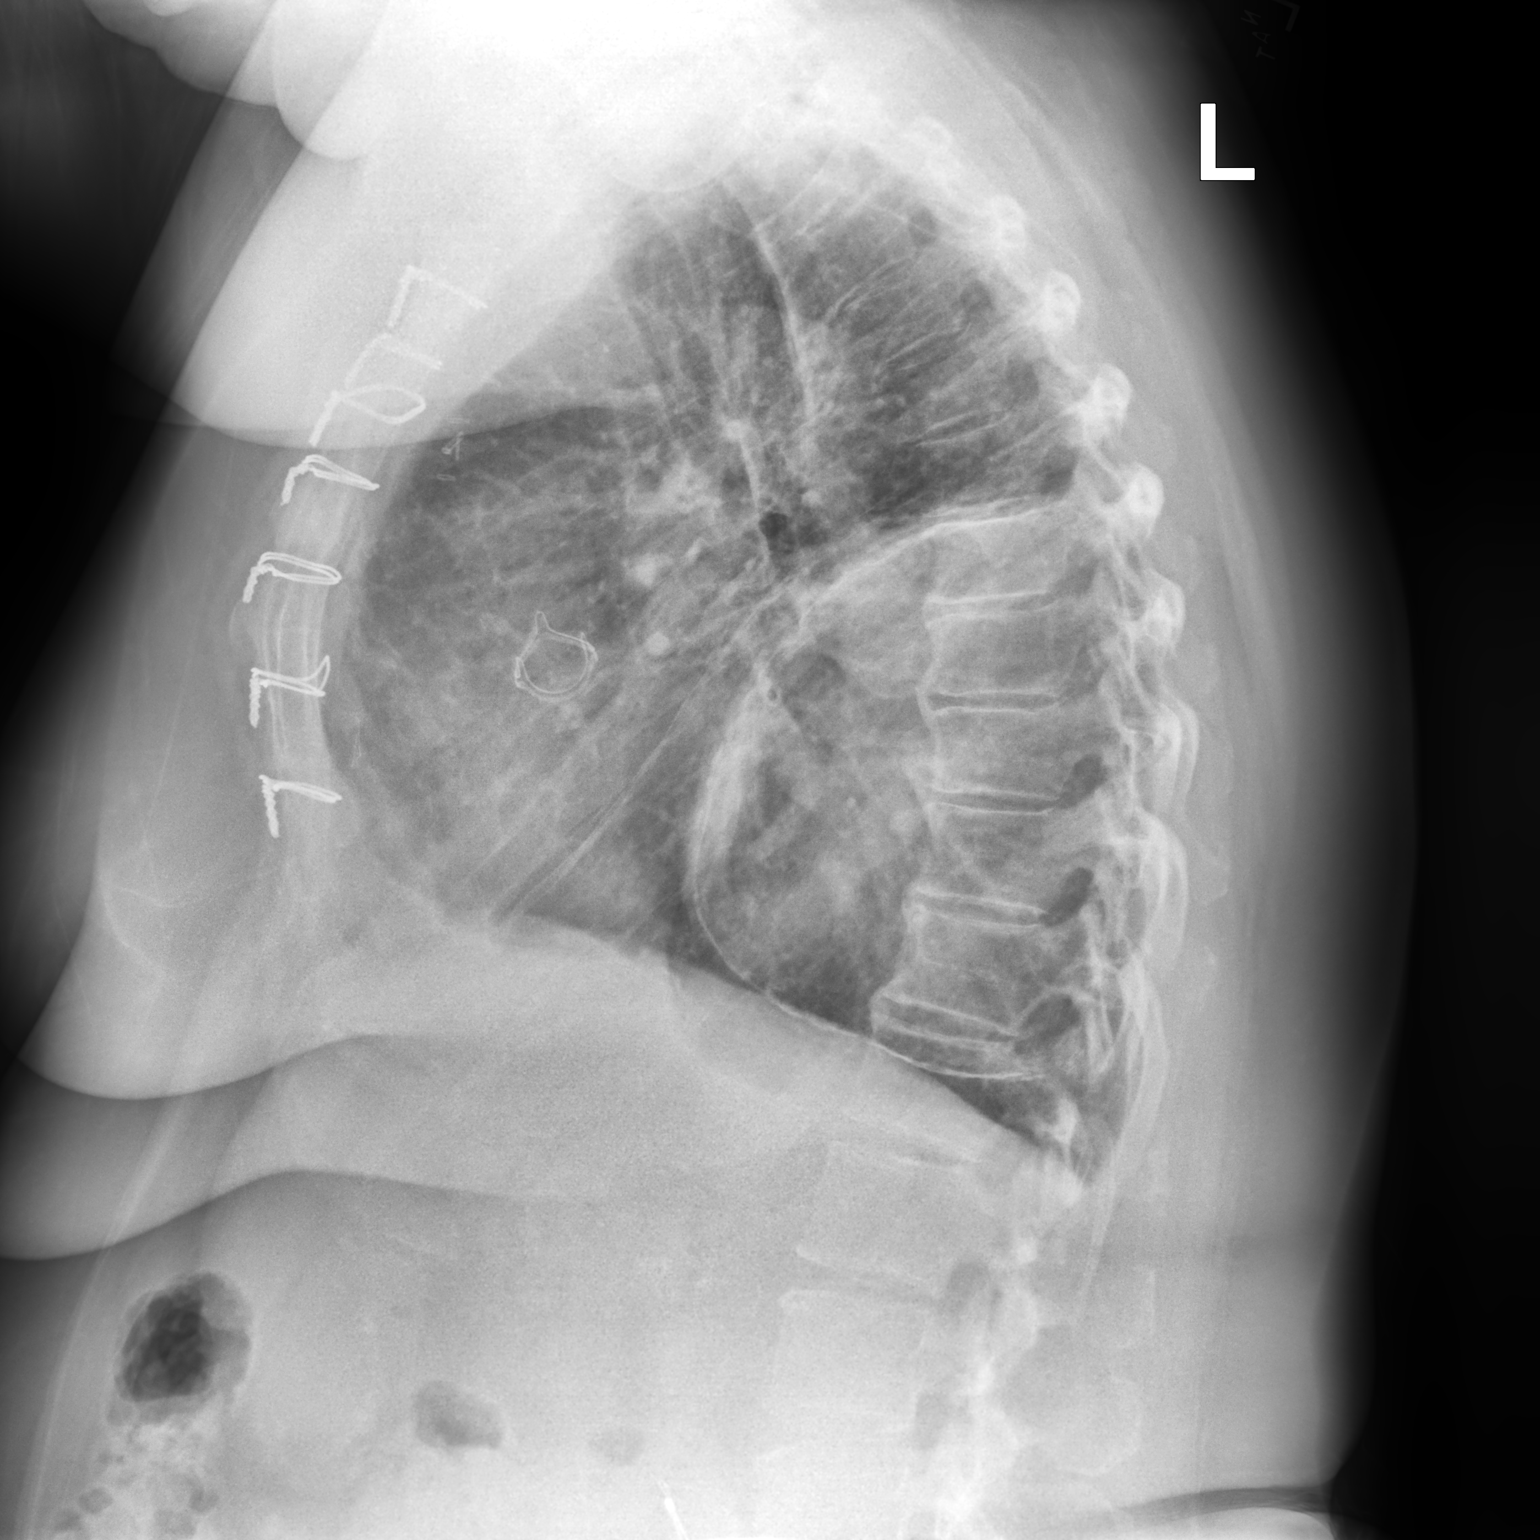

[2 of 2 positions shown; findings below may reference images not displayed]

FINDINGS: Prior median sternotomy and cardiac valve replacement. Stable
cardiomediastinal contours. Large peripherally left-sided pleural
fluid collection is unchanged. No new airspace consolidation is
identified. No pleural effusion or pneumothorax.
IMPRESSION: No new/acute cardiopulmonary findings. Unchanged large left-sided
pleural fluid collection.

## 2022-02-04 ENCOUNTER — Ambulatory Visit: Payer: Managed Care, Other (non HMO)

## 2022-02-04 DIAGNOSIS — Z7901 Long term (current) use of anticoagulants: Secondary | ICD-10-CM | POA: Diagnosis not present

## 2022-02-04 DIAGNOSIS — E538 Deficiency of other specified B group vitamins: Secondary | ICD-10-CM | POA: Diagnosis not present

## 2022-02-04 LAB — POCT INR: INR: 2.4 (ref 2.0–3.0)

## 2022-02-04 MED ORDER — CYANOCOBALAMIN 1000 MCG/ML IJ SOLN
1000.0000 ug | Freq: Once | INTRAMUSCULAR | Status: AC
  Administered 2022-02-04: 1000 ug via INTRAMUSCULAR

## 2022-02-04 NOTE — Progress Notes (Signed)
Pt reports that she has been replacing 1 -2 meals/day with shakes that contain kale and spinach.    Increase dose today to take 2 tablets and then continue to take 1 1/2 tablet daily and recheck in 5 weeks.   Pt requested B12 injection. Administered B12 injection in R deltoid, per Dr. Sharlet Salina order in Dr. Marliss Coots absence. Last B12 lab was 10/16/21 and result was 830 (normal range 180-914 pg/mL).

## 2022-02-04 NOTE — Patient Instructions (Addendum)
Pre visit review using our clinic review tool, if applicable. No additional management support is needed unless otherwise documented below in the visit note.  Increase dose today to take 2 tablets and then continue to take 1 1/2 tablet daily and recheck in 5 weeks.

## 2022-02-26 ENCOUNTER — Telehealth: Payer: Self-pay | Admitting: Hematology and Oncology

## 2022-02-26 NOTE — Telephone Encounter (Signed)
Contacted patient to scheduled appointments. Left message with appointment details and a call back number if patient had any questions or could not accommodate the time we provided.

## 2022-03-14 ENCOUNTER — Ambulatory Visit (INDEPENDENT_AMBULATORY_CARE_PROVIDER_SITE_OTHER): Payer: Managed Care, Other (non HMO)

## 2022-03-14 DIAGNOSIS — Z7901 Long term (current) use of anticoagulants: Secondary | ICD-10-CM | POA: Diagnosis not present

## 2022-03-14 DIAGNOSIS — E538 Deficiency of other specified B group vitamins: Secondary | ICD-10-CM

## 2022-03-14 LAB — POCT INR: INR: 2.9 (ref 2.0–3.0)

## 2022-03-14 NOTE — Progress Notes (Signed)
Pt reports that she has been replacing 1 -2 meals/day with shakes that contain kale and spinach.    Continue to take 1 1/2 tablet daily and recheck in 5 weeks.   Pt requested B12 injection. Administered B12 injection in R deltoid per pt request. Last B12 lab was 10/16/21 and result was 830 (normal range 180-914 pg/mL).

## 2022-03-14 NOTE — Patient Instructions (Addendum)
Pre visit review using our clinic review tool, if applicable. No additional management support is needed unless otherwise documented below in the visit note.  Continue to take 1 1/2 tablet daily and recheck in 5 weeks.

## 2022-04-02 ENCOUNTER — Ambulatory Visit: Payer: Managed Care, Other (non HMO) | Admitting: Hematology and Oncology

## 2022-04-02 ENCOUNTER — Other Ambulatory Visit: Payer: Managed Care, Other (non HMO)

## 2022-04-03 ENCOUNTER — Inpatient Hospital Stay: Payer: Managed Care, Other (non HMO) | Attending: Family Medicine

## 2022-04-03 ENCOUNTER — Inpatient Hospital Stay: Payer: Managed Care, Other (non HMO) | Admitting: Hematology and Oncology

## 2022-04-16 ENCOUNTER — Other Ambulatory Visit: Payer: Self-pay

## 2022-04-16 MED ORDER — AMOXICILLIN 500 MG PO TABS: ORAL_TABLET | ORAL | 1 refills | Status: DC

## 2022-04-17 ENCOUNTER — Other Ambulatory Visit: Payer: Self-pay | Admitting: Family Medicine

## 2022-04-17 ENCOUNTER — Ambulatory Visit: Payer: Managed Care, Other (non HMO) | Admitting: Internal Medicine

## 2022-04-17 DIAGNOSIS — Z7901 Long term (current) use of anticoagulants: Secondary | ICD-10-CM

## 2022-04-17 NOTE — Telephone Encounter (Signed)
Pt is compliant with warfarin management and PCP apts.  Sent in refill of warfarin to requested pharmacy.

## 2022-04-17 NOTE — Telephone Encounter (Signed)
Routing to coumadin nurse  

## 2022-04-18 ENCOUNTER — Ambulatory Visit (INDEPENDENT_AMBULATORY_CARE_PROVIDER_SITE_OTHER): Payer: Managed Care, Other (non HMO)

## 2022-04-18 DIAGNOSIS — Z7901 Long term (current) use of anticoagulants: Secondary | ICD-10-CM

## 2022-04-18 DIAGNOSIS — E538 Deficiency of other specified B group vitamins: Secondary | ICD-10-CM | POA: Diagnosis not present

## 2022-04-18 LAB — POCT INR: INR: 3 (ref 2.0–3.0)

## 2022-04-18 MED ORDER — CYANOCOBALAMIN 1000 MCG/ML IJ SOLN
1000.0000 ug | Freq: Once | INTRAMUSCULAR | Status: AC
  Administered 2022-04-18: 1000 ug via INTRAMUSCULAR

## 2022-04-18 NOTE — Patient Instructions (Signed)
Pre visit review using our clinic review tool, if applicable. No additional management support is needed unless otherwise documented below in the visit note. 

## 2022-04-18 NOTE — Progress Notes (Signed)
Pt reports that she has been replacing 1 -2 meals/day with shakes that contain kale and spinach.    Continue to take 1 1/2 tablet daily and recheck in 5 weeks.   Pt requested B12 injection. Administered B12 injection in R deltoid per pt request. Last B12 lab was 10/16/21 and result was 830 (normal range 180-914 pg/mL).  

## 2022-04-30 LAB — LAB REPORT - SCANNED
Albumin, Urine POC: 48.8
Creatinine, POC: 83.1 mg/dL
EGFR: 69
Microalb Creat Ratio: 59

## 2022-05-04 NOTE — Progress Notes (Unsigned)
Cardiology Office Note   Date:  05/06/2022   ID:  [name], DOB 1965/05/31, MRN [id number]  PCP:  Abner Greenspan, MD  Cardiologist:   [name], MD    Pt presents for f/u of AV dz and HTN   History of Present Illness: [name] is a 57 y.o. female with a history of HTN, HL, OSA, lupus,, Factor V Leiden, DVT/PE, L Lung empyema, intracerbral hemorrhage   \  Dec 2021 CP and increased SOB  lead to L heart cath which showed normal coronary arteries.   Mean gradient across AV was 52 mm.  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  Post op had atrial fibrillation   Placed on amiodarone The pt was seen in f/u by [facility] n March.  Doing well   Echo in APril 2022 showed prosthesis functioning well   Mean gradient 17 mm Hg.    I saw the pt in clinic in Sept 2023  Patient had an echo in October 2023  Valve working well  LV systolic and diastolic function normal     Since seen the pt denies CP   She says she  has days where she feels good   Then on other days she  feels winded   Wheezy   She was seen by J Coladonato recently   Lasix 20 mg added to regimen        Current Meds  Medication Sig   albuterol (VENTOLIN HFA) 108 (90 Base) MCG/ACT inhaler Inhale 1-2 puffs into the lungs every 6 (six) hours as needed for wheezing or shortness of breath.   amoxicillin (AMOXIL) 500 MG tablet TAKE 4 TABS (2000 MG) 30-60 MIN BEFORE DENTAL PROCEDURE.   aspirin EC 81 MG EC tablet Take 1 tablet (81 mg total) by mouth daily. Swallow whole.   bismuth subsalicylate (PEPTO BISMOL) 262 MG/15ML suspension Take 30 mLs by mouth every 6 (six) hours as needed for indigestion or diarrhea or loose stools.   calcium carbonate (TUMS - DOSED IN MG ELEMENTAL CALCIUM) 500 MG chewable tablet Chew 1-2  tablets by mouth 3 (three) times daily as needed for indigestion or heartburn.   cyanocobalamin (,VITAMIN B-12,) 1000 MCG/ML injection Inject 1,000 mcg into the muscle every 30 (thirty) days.    ezetimibe (ZETIA) 10 MG tablet Take 1 tablet (10 mg total) by mouth daily.   furosemide (LASIX) 20 MG tablet Take 20 mg by mouth daily.   hydroxychloroquine (PLAQUENIL) 200 MG tablet Take 200 mg by mouth 2 (two) times daily.   levothyroxine (SYNTHROID) 175 MCG tablet Take 1 tablet (175 mcg total) by mouth daily.   metoprolol tartrate (LOPRESSOR) 50 MG tablet TAKE 1 TABLET BY MOUTH TWICE A DAY   Multiple Vitamins-Minerals (BARIATRIC MULTIVITAMINS/IRON) CAPS Take 1 tablet by mouth daily.   omeprazole (PRILOSEC) 40 MG capsule Take 1 capsule (40 mg total) by mouth daily.   PARoxetine (PAXIL) 40 MG tablet TAKE 1 TABLET BY MOUTH EVERY DAY IN THE MORNING   rosuvastatin (CRESTOR) 40 MG tablet Take 1 tablet (40 mg total) by mouth daily.   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    Loculated-chronic on Left-VanTright; s/p VATS 5/10   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   GERD (gastroesophageal reflux disease)    on prilosec r/t gastric sleeve surgery   HLD (hyperlipidemia)    Iron deficiency anemia    IV dextran [facility]   Nephritis and nephropathy, not specified as acute or chronic, with unspecified pathological lesion in kidney    Nonspecific abnormal results of liver function study    Obesity, unspecified    Other specified acquired hypothyroidism    Panic disorder without  agoraphobia    Personal history of venous thrombosis and embolism 2002   during pregnancy; ?factor 5 leiden (sees heme)   Pleural effusion 2005   c/w lupus initial w/u; recurrent Right as pred tapered off July 2011   Sleep apnea    Systemic lupus erythematosus ([place])    renal GN, hx of pericardial eff in late 90's   Unspecified essential hypertension     Past Surgical History:  Procedure Laterality Date   AORTIC VALVE REPLACEMENT N/A 04/06/2020   Procedure: AORTIC VALVE REPLACEMENT (AVR) USING INSPIRIS 21 MM AORTIC VALVE;  Surgeon: [name], MD;  Location: [facility];  Service: Open Heart Surgery;  Laterality: N/A;   BIOPSY  04/12/2019   Procedure: BIOPSY;  Surgeon: Thornton Park, MD;  Location: [facility];  Service: Gastroenterology;;   CARDIAC CATHETERIZATION     ESOPHAGEAL MANOMETRY N/A 07/15/2019   Procedure: ESOPHAGEAL MANOMETRY (EM);  Surgeon: Mauri Pole, MD;  Location: [facility];  Service: Endoscopy;  Laterality: N/A;   ESOPHAGOGASTRODUODENOSCOPY (EGD) WITH PROPOFOL N/A 04/12/2019   Procedure: ESOPHAGOGASTRODUODENOSCOPY (EGD) WITH PROPOFOL;  Surgeon: Thornton Park, MD;  Location: [facility];  Service: Gastroenterology;  Laterality: N/A;   gastric sleeve  09/04/2010   bariatric surgery Dr Evorn Gong    LAPAROSCOPIC TUBAL LIGATION  12/09/2010   Procedure: LAPAROSCOPIC TUBAL LIGATION;  Surgeon: Darlyn Chamber;  Location: [facility];  Service: Gynecology;  Laterality: Bilateral;  Attempted see Nursing note   pleurocentesis  10/04/2004   Pleuryx catheter placement  10/04/2009   VanTright----removed 9/11   RENAL BIOPSY  09/24/2012   RIGHT/LEFT HEART CATH AND CORONARY ANGIOGRAPHY N/A 01/19/2020   Procedure: RIGHT/LEFT HEART CATH AND CORONARY ANGIOGRAPHY;  Surgeon: Lorretta Harp, MD;  Location: [facility];  Service: Cardiovascular;  Laterality: N/A;   svd     x 3   TEE WITHOUT CARDIOVERSION N/A 04/06/2020   Procedure: TRANSESOPHAGEAL ECHOCARDIOGRAM (TEE);   Surgeon: [name], MD;  Location: [facility];  Service: Open Heart Surgery;  Laterality: N/A;   THORACENTESIS  02/04/2008   with penumonia   WISDOM TOOTH EXTRACTION       Social History:  The patient  reports that she quit smoking about 13 years ago. Her smoking use included cigarettes. She has a 5.00 pack-year smoking history. She has never used smokeless tobacco. She reports that she does not currently use alcohol. She reports that she does not use drugs.   Family History:  [name] family history includes Diabetes in an other family member; Hyperlipidemia  in her father; Hypertension in her father; Leukemia in her sister; Lupus in her sister.    ROS:  Please see the history of present illness. All other systems are reviewed and  Negative to the above problem except as noted.    PHYSICAL EXAM: VS:  BP 124/72   Pulse 76   Ht 5' 3.5" (1.613 m)   Wt (!) 348 lb 12.8 oz (158.2 kg)   LMP 11/08/2010   SpO2 96%   BMI 60.82 kg/m   GEN: Morbidly obese 57 yo in no acute distress  HEENT: normal  Neck: no JVD,  Cardiac: RRR; Gr III/VI systolic murmur base  No Significant LE  edema  Respiratory:  clear to auscultation   No wheezes  GI: soft, nontender,     EKG:  EKG is not ordered today.   Lipid Panel    Component Value Date/Time   CHOL 144 10/16/2021 0912   CHOL 163 09/07/2020 0750   TRIG 107.0 10/16/2021 0912   HDL 51.30 10/16/2021 0912   HDL 57 09/07/2020 0750   CHOLHDL 3 10/16/2021 0912   VLDL 21.4 10/16/2021 0912   LDLCALC 71 10/16/2021 0912   LDLCALC 91 09/07/2020 0750   LDLDIRECT 203.7 06/03/2011 1433      Wt Readings from Last 3 Encounters:  05/06/22 (!) 348 lb 12.8 oz (158.2 kg)  10/23/21 (!) 331 lb (150.1 kg)  10/15/21 (!) 336 lb 9.6 oz (152.7 kg)      ASSESSMENT AND PLAN:   1  Dyspnea   Intermittent   Does not sound like anginal equivalent Volume status difficult to assess give body habitus   Still could have some volume increase Agree with 20 mg  lasix   I have asked her to call if has recurrent dyspnea  2  AV dz   Pt is s/p AVR in March 2022 with 21 mm bioprosthesis. Prosthesis working well in echo done in Oct 2023  3  Hx Facto V Leiden   COntinue on warfarin    INR followed in clinic 4  HTN  BP is well controlled  5  Hx SLE    Continue Plaquenil  6   HL  Keep on Crestor  LDL 71, HDL 51 in Sept 2023     7  Afib  Only documentations was post op   Follow   Cllnically no recurrence   8  Sleep apnea   Has OSA severe documented in sleep eval in 2013   Not on CPAP   Will get anotehr study      Follow up next winter        Current medicines are reviewed at length with the patient today.  The patient does not have concerns regarding medicines.  Signed, [name], MD  05/06/2022 2:50 PM    [facility] [address], [place], [place]  [id number] Phone: [phone]; Fax: [phone]

## 2022-05-06 ENCOUNTER — Encounter: Payer: Self-pay | Admitting: Nephrology

## 2022-05-06 ENCOUNTER — Ambulatory Visit: Payer: Managed Care, Other (non HMO) | Attending: Internal Medicine | Admitting: Internal Medicine

## 2022-05-06 VITALS — BP 124/72 | HR 76 | Ht 63.5 in | Wt 348.8 lb

## 2022-05-06 DIAGNOSIS — G4733 Obstructive sleep apnea (adult) (pediatric): Secondary | ICD-10-CM | POA: Diagnosis not present

## 2022-05-06 DIAGNOSIS — R0683 Snoring: Secondary | ICD-10-CM

## 2022-05-06 DIAGNOSIS — I359 Nonrheumatic aortic valve disorder, unspecified: Secondary | ICD-10-CM

## 2022-05-06 NOTE — Patient Instructions (Signed)
Medication Instructions:   *If you need a refill on your cardiac medications before your next appointment, please call your pharmacy*   Lab Work:  If you have labs (blood work) drawn today and your tests are completely normal, you will receive your results only by: [place] (if you have MyChart) OR A paper copy in the mail If you have any lab test that is abnormal or we need to change your treatment, we will call you to review the results.   Testing/Procedures:    Follow-Up: At [facility], you and your health needs are our priority.  As part of our continuing mission to provide you with exceptional heart care, we have created designated Provider Care Teams.  These Care Teams include your primary Cardiologist (physician) and Advanced Practice Providers (APPs -  Physician Assistants and Nurse Practitioners) who all work together to provide you with the care you need, when you need it.  We recommend signing up for the patient portal called "MyChart".  Sign up information is provided on this After Visit Summary.  MyChart is used to connect with patients for Virtual Visits (Telemedicine).  Patients are able to view lab/test results, encounter notes, upcoming appointments, etc.  Non-urgent messages can be sent to your provider as well.   To learn more about what you can do with MyChart, go to [email or web address].    Your next appointment:   6 month(s)  Provider:   [name], MD     Other Instructions

## 2022-05-06 NOTE — Addendum Note (Signed)
Addended by: Stephani Police on: 05/06/2022 05:56 PM   Modules accepted: Orders

## 2022-05-07 ENCOUNTER — Telehealth: Payer: Self-pay | Admitting: *Deleted

## 2022-05-07 DIAGNOSIS — G4733 Obstructive sleep apnea (adult) (pediatric): Secondary | ICD-10-CM

## 2022-05-07 DIAGNOSIS — R0683 Snoring: Secondary | ICD-10-CM

## 2022-05-07 DIAGNOSIS — I10 Essential (primary) hypertension: Secondary | ICD-10-CM

## 2022-05-07 NOTE — Telephone Encounter (Signed)
-----   Message from Stephani Police, RN sent at 05/06/2022  5:58 PM EDT ----- Regarding: Sleep Hi Gae Bon,   Dr Harrington Challenger spoke with Dr Radford Pax and they would like to get a Split Night Sleep Study.   She is not wearing her CPAP and I do not think has for a very long time.   Thank you!! Lelon Frohlich

## 2022-05-23 ENCOUNTER — Ambulatory Visit: Payer: Managed Care, Other (non HMO)

## 2022-05-26 NOTE — Addendum Note (Signed)
Addended by: Reesa Chew on: 05/26/2022 03:45 PM   Modules accepted: Orders

## 2022-05-26 NOTE — Telephone Encounter (Addendum)
In lab sleep study not approved because a similar test to the one requested has recently been performed. The results of that study showed your doctor what they needed to see in order to treat your condition. No additional testing is necessary at this time. You have had a sleep test that found  the source of your problem (diagnostic sleep study). A sleep study to treat your sleep problem (titration sleep study) can be done. Repeat testing is not supported once the source of your problem has been diagnosed. A different study (08657) Polysomnography with breathing equipment is likely to show your doctor all of the details they need to see in order to treat you. This study will be approved if requested.  Will order titration

## 2022-05-30 ENCOUNTER — Ambulatory Visit (INDEPENDENT_AMBULATORY_CARE_PROVIDER_SITE_OTHER): Payer: Managed Care, Other (non HMO)

## 2022-05-30 DIAGNOSIS — Z7901 Long term (current) use of anticoagulants: Secondary | ICD-10-CM | POA: Diagnosis not present

## 2022-05-30 DIAGNOSIS — E538 Deficiency of other specified B group vitamins: Secondary | ICD-10-CM

## 2022-05-30 LAB — POCT INR: INR: 2.5 (ref 2.0–3.0)

## 2022-05-30 MED ORDER — CYANOCOBALAMIN 1000 MCG/ML IJ SOLN
1000.0000 ug | Freq: Once | INTRAMUSCULAR | Status: AC
  Administered 2022-05-30: 1000 ug via INTRAMUSCULAR

## 2022-05-30 NOTE — Progress Notes (Signed)
Per orders of Dr. Okey Dupre in Dr. Lucretia Roers absence, injection of B12 in R deltoid given by Sherrie George. Patient tolerated injection well.

## 2022-05-30 NOTE — Patient Instructions (Addendum)
Pre visit review using our clinic review tool, if applicable. No additional management support is needed unless otherwise documented below in the visit note.  Continue to take 1 1/2 tablet daily and recheck in 5 weeks.  

## 2022-05-30 NOTE — Progress Notes (Addendum)
Pt reports that she has been replacing 1 -2 meals/day with shakes that contain kale and spinach.   Continue to take 1 1/2 tablet daily and recheck in 4 weeks.   Pt requested B12 injection. Administered B12 injection in R deltoid per pt request. Last B12 lab was 10/16/21 and result was 830 (normal range 180-914 pg/mL).

## 2022-06-23 LAB — HM MAMMOGRAPHY

## 2022-06-24 LAB — HM PAP SMEAR: HPV, high-risk: NEGATIVE

## 2022-06-26 ENCOUNTER — Ambulatory Visit (HOSPITAL_BASED_OUTPATIENT_CLINIC_OR_DEPARTMENT_OTHER): Payer: Managed Care, Other (non HMO) | Admitting: Cardiology

## 2022-06-27 ENCOUNTER — Ambulatory Visit (INDEPENDENT_AMBULATORY_CARE_PROVIDER_SITE_OTHER): Payer: Managed Care, Other (non HMO)

## 2022-06-27 DIAGNOSIS — Z7901 Long term (current) use of anticoagulants: Secondary | ICD-10-CM | POA: Diagnosis not present

## 2022-06-27 DIAGNOSIS — E538 Deficiency of other specified B group vitamins: Secondary | ICD-10-CM | POA: Diagnosis not present

## 2022-06-27 LAB — POCT INR: INR: 3 (ref 2.0–3.0)

## 2022-06-27 MED ORDER — CYANOCOBALAMIN 1000 MCG/ML IJ SOLN
1000.0000 ug | Freq: Once | INTRAMUSCULAR | Status: AC
Start: 2022-06-27 — End: 2022-06-27
  Administered 2022-06-27: 1000 ug via INTRAMUSCULAR

## 2022-06-27 NOTE — Progress Notes (Addendum)
Pt reports that she has been replacing 1 -2 meals/day with shakes that contain kale and spinach.    Continue to take 1 1/2 tablet daily and recheck in 5 weeks.   Pt requested B12 injection. Administered B12 injection in R deltoid per pt request. Last B12 lab was 10/16/21 and result was 830 (normal range 180-914 pg/mL).  

## 2022-06-27 NOTE — Patient Instructions (Addendum)
Pre visit review using our clinic review tool, if applicable. No additional management support is needed unless otherwise documented below in the visit note.  Continue to take 1 1/2 tablet daily and recheck in 5 weeks.  

## 2022-06-28 ENCOUNTER — Other Ambulatory Visit: Payer: Self-pay | Admitting: Internal Medicine

## 2022-07-14 ENCOUNTER — Ambulatory Visit (HOSPITAL_BASED_OUTPATIENT_CLINIC_OR_DEPARTMENT_OTHER): Payer: Managed Care, Other (non HMO) | Attending: Cardiology | Admitting: Cardiology

## 2022-07-14 VITALS — Ht 65.0 in | Wt 330.0 lb

## 2022-07-14 DIAGNOSIS — G4733 Obstructive sleep apnea (adult) (pediatric): Secondary | ICD-10-CM | POA: Diagnosis present

## 2022-07-14 DIAGNOSIS — I10 Essential (primary) hypertension: Secondary | ICD-10-CM | POA: Diagnosis not present

## 2022-07-20 NOTE — Procedures (Signed)
   Patient Name: [name], [name] Date: 07/14/2022 Gender: Female D.O.B: February 12, 1965 Age (years): 63 Referring Provider: Armanda Magic MD, ABSM Height (inches): 65 Interpreting Physician: Armanda Magic MD, ABSM Weight (lbs): 330 RPSGT: Shelah Lewandowsky BMI: 55 MRN: [id number] Neck Size: 18.00  CLINICAL INFORMATION The patient is referred for a CPAP titration to treat sleep apnea.  SLEEP STUDY TECHNIQUE As per the AASM Manual for the Scoring of Sleep and Associated Events v2.3 (April 2016) with a hypopnea requiring 4% desaturations.  The channels recorded and monitored were frontal, central and occipital EEG, electrooculogram (EOG), submentalis EMG (chin), nasal and oral airflow, thoracic and abdominal wall motion, anterior tibialis EMG, snore microphone, electrocardiogram, and pulse oximetry. Continuous positive airway pressure (CPAP) was initiated at the beginning of the study and titrated to treat sleep-disordered breathing.  MEDICATIONS Medications self-administered by patient taken the night of the study : N/A  TECHNICIAN COMMENTS Comments added by technician: NONE Comments added by scorer: N/A  RESPIRATORY PARAMETERS Optimal PAP Pressure (cm): 14  AHI at Optimal Pressure (/hr):1.1 Overall Minimal O2 (%):80.0  Supine % at Optimal Pressure (%):100 Minimal O2 at Optimal Pressure (%): 84.0   SLEEP ARCHITECTURE The study was initiated at 9:41:41 PM and ended at 4:16:59 AM.  Sleep onset time was 8.7 minutes and the sleep efficiency was 87.3%. The total sleep time was 345 minutes.  The patient spent 6.5% of the night in stage N1 sleep, 79.1% in stage N2 sleep, 0.0% in stage N3 and 14.4% in REM.Stage REM latency was 264.5 minutes  Wake after sleep onset was 41.6. Alpha intrusion was absent. Supine sleep was 74.64%.  CARDIAC DATA The 2 lead EKG demonstrated sinus rhythm. The mean heart rate was 79.4 beats per minute. Other EKG findings include:None.  LEG MOVEMENT DATA The  total Periodic Limb Movements of Sleep (PLMS) were 0. The PLMS index was 0.0. A PLMS index of <15 is considered normal in adults.  IMPRESSIONS - The optimal PAP pressure was 14 cm of water. - Central sleep apnea was not noted during this titration (CAI = 0.3/h). - Severe oxygen desaturations were observed during this titration (min O2 = 80.0%). - The patient snored with moderate snoring volume during this titration study. - 2-lead EKG demonstrated: NSR - Clinically significant periodic limb movements were not noted during this study. Arousals associated with PLMs were rare.  DIAGNOSIS - Obstructive Sleep Apnea (G47.33)  RECOMMENDATIONS - Trial of ResMed CPAP therapy on 14 cm H2O with a X-Small size Resmed Nasal Pillow AirFit P10 for Her mask and heated humidification. - Avoid alcohol, sedatives and other CNS depressants that may worsen sleep apnea and disrupt normal sleep architecture. - Sleep hygiene should be reviewed to assess factors that may improve sleep quality. - Weight management and regular exercise should be initiated or continued. - Return to Sleep Center for re-evaluation after 4 weeks of therapy  [Electronically signed] 07/20/2022 09:41 AM  Armanda Magic MD, ABSM Diplomate, American Board of Sleep Medicine

## 2022-08-01 ENCOUNTER — Ambulatory Visit: Payer: Managed Care, Other (non HMO)

## 2022-08-05 ENCOUNTER — Ambulatory Visit (INDEPENDENT_AMBULATORY_CARE_PROVIDER_SITE_OTHER): Payer: Managed Care, Other (non HMO)

## 2022-08-05 DIAGNOSIS — Z7901 Long term (current) use of anticoagulants: Secondary | ICD-10-CM

## 2022-08-05 DIAGNOSIS — E538 Deficiency of other specified B group vitamins: Secondary | ICD-10-CM

## 2022-08-05 LAB — POCT INR: INR: 2.2 (ref 2.0–3.0)

## 2022-08-05 MED ORDER — CYANOCOBALAMIN 1000 MCG/ML IJ SOLN
1000.0000 ug | Freq: Once | INTRAMUSCULAR | Status: AC
Start: 2022-08-05 — End: 2022-08-05
  Administered 2022-08-05: 1000 ug via INTRAMUSCULAR

## 2022-08-05 NOTE — Patient Instructions (Addendum)
Pre visit review using our clinic review tool, if applicable. No additional management support is needed unless otherwise documented below in the visit note.  Increase dose today to take 2 tablets and then continue to take 1 1/2 tablet daily and recheck in 5 weeks.  

## 2022-08-05 NOTE — Progress Notes (Signed)
Pt reports that she has been replacing 1 -2 meals/day with shakes that contain kale and spinach. Pt missed dose of warfarin last week.   Increase dose today to take 2 tablets and then continue to take 1 1/2 tablet daily and recheck in 5 weeks per pt request.  Pt requested B12 injection. Administered B12 injection in R deltoid per pt request. Last B12 lab was 10/16/21 and result was 830 (normal range 180-914 pg/mL).

## 2022-09-05 ENCOUNTER — Telehealth: Payer: Self-pay | Admitting: *Deleted

## 2022-09-05 DIAGNOSIS — G4733 Obstructive sleep apnea (adult) (pediatric): Secondary | ICD-10-CM

## 2022-09-05 DIAGNOSIS — I10 Essential (primary) hypertension: Secondary | ICD-10-CM

## 2022-09-05 DIAGNOSIS — R0683 Snoring: Secondary | ICD-10-CM

## 2022-09-05 NOTE — Telephone Encounter (Signed)
-----   Message from Armanda Magic sent at 07/20/2022  9:43 AM EDT ----- Please let patient know that they had a successful PAP titration and let DME know that orders are in EPIC.  Please set up 6 week OV with me.

## 2022-09-05 NOTE — Telephone Encounter (Signed)
The patient has been notified of the result and verbalized understanding.  All questions (if any) were answered. [name], CMA 09/05/2022 5:32 PM    Upon patient request DME selection is [facility] Patient understands he will be contacted by [facility] to set up his cpap. Patient understands to call if [facility] does not contact him with new setup in a timely manner. Patient understands they will be called once confirmation has been received from [facility] that they have received their new machine to schedule 10 week follow up appointment.   [facility] notified of new cpap order  Please add to airview Patient was grateful for the call and thanked me.

## 2022-09-09 ENCOUNTER — Ambulatory Visit: Payer: Managed Care, Other (non HMO)

## 2022-09-12 ENCOUNTER — Ambulatory Visit: Payer: Managed Care, Other (non HMO)

## 2022-09-16 ENCOUNTER — Ambulatory Visit: Payer: Managed Care, Other (non HMO)

## 2022-09-19 ENCOUNTER — Ambulatory Visit (INDEPENDENT_AMBULATORY_CARE_PROVIDER_SITE_OTHER): Payer: Managed Care, Other (non HMO)

## 2022-09-19 DIAGNOSIS — E538 Deficiency of other specified B group vitamins: Secondary | ICD-10-CM

## 2022-09-19 DIAGNOSIS — Z7901 Long term (current) use of anticoagulants: Secondary | ICD-10-CM | POA: Diagnosis not present

## 2022-09-19 LAB — POCT INR: INR: 2.9 (ref 2.0–3.0)

## 2022-09-19 MED ORDER — CYANOCOBALAMIN 1000 MCG/ML IJ SOLN
1000.0000 ug | Freq: Once | INTRAMUSCULAR | Status: AC
Start: 2022-09-19 — End: 2022-09-19
  Administered 2022-09-19: 1000 ug via INTRAMUSCULAR

## 2022-09-19 NOTE — Patient Instructions (Addendum)
Pre visit review using our clinic review tool, if applicable. No additional management support is needed unless otherwise documented below in the visit note.  Continue to take 1 1/2 tablet daily and recheck in 5 weeks.

## 2022-09-19 NOTE — Progress Notes (Signed)
Continue to take 1 1/2 tablet daily and recheck in 5 weeks.  Per orders of Dr. Jonny Ruiz in Dr. Royden Purl absence, injection of B12  given by Sherrie George. Patient tolerated injection well.  Pt requested B12 injection. Administered B12 injection in R deltoid per pt request. Last B12 lab was 10/16/21 and result was 830 (normal range 180-914 pg/mL).   Advised pt she is due for her annual physical after 9/20. Pt said she would contact the office to schedule it.

## 2022-09-26 ENCOUNTER — Other Ambulatory Visit: Payer: Self-pay | Admitting: Family Medicine

## 2022-09-26 ENCOUNTER — Other Ambulatory Visit: Payer: Self-pay | Admitting: Internal Medicine

## 2022-10-17 ENCOUNTER — Other Ambulatory Visit: Payer: Self-pay | Admitting: Family Medicine

## 2022-10-17 DIAGNOSIS — Z7901 Long term (current) use of anticoagulants: Secondary | ICD-10-CM

## 2022-10-17 NOTE — Telephone Encounter (Signed)
Pt is compliant with warfarin management and PCP apts.  Sent in refill of warfarin to requested pharmacy.

## 2022-10-24 ENCOUNTER — Ambulatory Visit: Payer: Managed Care, Other (non HMO)

## 2022-10-27 ENCOUNTER — Telehealth: Payer: Self-pay

## 2022-10-27 NOTE — Telephone Encounter (Signed)
Need to [facility] coumadin clinic apt for tomorrow due to coumadin clinic nurse not in the office. LVM she can have a lab draw tomorrow or [facility] coumadin clinic apt for Friday, 9/27, in the AM or 10/1, anytime.

## 2022-10-28 ENCOUNTER — Ambulatory Visit: Payer: Managed Care, Other (non HMO)

## 2022-11-04 ENCOUNTER — Ambulatory Visit: Payer: Managed Care, Other (non HMO)

## 2022-11-04 ENCOUNTER — Telehealth: Payer: Self-pay

## 2022-11-04 NOTE — Telephone Encounter (Signed)
Pt missed coumadin clinic apt today. LVM to call to RS.

## 2022-11-07 NOTE — Telephone Encounter (Signed)
LVM to call to RS coumadin clinic apt.

## 2022-11-07 NOTE — Telephone Encounter (Signed)
Pt returned call and made coumadin clinic apt for 10/8.

## 2022-11-11 ENCOUNTER — Ambulatory Visit: Payer: Managed Care, Other (non HMO)

## 2022-11-11 ENCOUNTER — Telehealth: Payer: Self-pay

## 2022-11-11 DIAGNOSIS — E538 Deficiency of other specified B group vitamins: Secondary | ICD-10-CM

## 2022-11-11 DIAGNOSIS — Z7901 Long term (current) use of anticoagulants: Secondary | ICD-10-CM

## 2022-11-11 LAB — POCT INR: INR: 2 (ref 2.0–3.0)

## 2022-11-11 MED ORDER — CYANOCOBALAMIN 1000 MCG/ML IJ SOLN
1000.0000 ug | Freq: Once | INTRAMUSCULAR | Status: AC
Start: 2022-11-11 — End: 2022-11-11
  Administered 2022-11-11: 1000 ug via INTRAMUSCULAR

## 2022-11-11 NOTE — Telephone Encounter (Signed)
Pt in today for INR check and learned that she could test INR at home. Advised the company will look into the cost for her and then contact her before proceeding. Advised a msg would be sent ot PCP to make sure she agrees. If PCP agrees an order will be faxed and the company will contact her. Pt appreciative and verbalized understanding.

## 2022-11-11 NOTE — Telephone Encounter (Signed)
That sounds fine Please send me the order, thanks

## 2022-11-11 NOTE — Patient Instructions (Addendum)
Pre visit review using our clinic review tool, if applicable. No additional management support is needed unless otherwise documented below in the visit note.  Increase dose today to take 2 tablets and increase dose tomorrow to take 2 tablets and then continue to take 1 1/2 tablet daily and recheck in 2 weeks.

## 2022-11-11 NOTE — Progress Notes (Addendum)
Pt missed dose of warfarin this weekend.   Increase dose today to take 2 tablets and increase dose tomorrow to take 2 tablets and then continue to take 1 1/2 tablet daily and recheck in 2 weeks.   Pt requested B12 injection. Administered B12 injection in R deltoid per pt request. Last B12 lab was 10/16/21 and result was 830 (normal range 180-914 pg/mL).   Pt requested trying to test INR at home. Advised this would be discussed with PCP and if she agrees there will be an order sent. Advised the company would contact her after contacting her insurance. Pt appreciative and verbalized understanding.  Sent msg to PCP concerning home INR machine order.

## 2022-11-14 NOTE — Telephone Encounter (Signed)
Order has been signed by PCP. Faxed order this morning to [employer].

## 2022-11-17 NOTE — Telephone Encounter (Signed)
Received fax from [facility] requesting fax number for prescribing clinic. Completed form and faxed back. Received confirmation of receipt.

## 2022-11-21 NOTE — Telephone Encounter (Signed)
LVM for pt requesting if she has been contacted by [employer] and is there anything needed.

## 2022-11-25 ENCOUNTER — Ambulatory Visit (INDEPENDENT_AMBULATORY_CARE_PROVIDER_SITE_OTHER): Payer: Managed Care, Other (non HMO)

## 2022-11-25 DIAGNOSIS — Z7901 Long term (current) use of anticoagulants: Secondary | ICD-10-CM

## 2022-11-25 LAB — POCT INR: INR: 2.3 (ref 2.0–3.0)

## 2022-11-25 NOTE — Patient Instructions (Addendum)
Pre visit review using our clinic review tool, if applicable. No additional management support is needed unless otherwise documented below in the visit note.  Increase dose today to take 2 tablets and then change weekly dose to take 1 1/2 tablet daily except take 2 tablets on Mondays and Thursdays and recheck in 1 weeks.

## 2022-11-25 NOTE — Telephone Encounter (Signed)
Pt in today for INR check and reported she will be set up with her home INR machine on 10/24.   Pt usually receives her B12 injections at her coumadin clinic apts. I advised pt she could be taught how to administer the injections to herself. Pt agreed and reported she has given herself Lovenox injections in the past. Advised pt should come in about 3 weeks when her next injection is due and this nurse will educate her on B12 injections. Advised to bring the B12 and syringes/needles she receives from the pharmacy when she comes for education. Pt verbalized understanding and was appreciative of the help.   Forwarding msg to PCP to assure she is ok with pt injecting B12 herself and for scripts to be sent in. Pended B12, and needles/syringes for provider review.

## 2022-11-25 NOTE — Progress Notes (Signed)
Increase dose today to take 2 tablets and then change weekly dose to take 1 1/2 tablet daily except take 2 tablets on Mondays and Thursdays and recheck in 1 weeks.    Pt will be set up with a home INR machine on 10/24. She will be shown how to use the INR machine by an [employer] representative. She will test next week also on 10/31.   She has been receiving B12 injections at her coumadin clinic apts Q5 weeks and will not need to come to the office any longer for INR checks, since she will performing the test at home. Advised pt she could also give herself B12 injections if she would like to be taught. Pt reports she has given herself Lovenox injections in the past and would like to learn how to administer B12 injections. Pt requested an apt in 3 weeks, when her next B12 is due, to come in for injection education. Advised a msg will be sent to her PCP requesting B12 and syringes/needles be sent to pharmacy so pt can bring them in for teaching in 3 weeks. Advised if any problems obtaining the B12 or syringes/needles to contact the office. Pt verbalized understanding and was appreciative of the help.   Sent msg to PCP.

## 2022-11-27 MED ORDER — NEEDLE (DISP) 25G X 1" MISC
1.0000 | 1 refills | Status: AC
Start: 2022-11-27 — End: 2023-05-26

## 2022-11-27 MED ORDER — SYRINGE 25G X 5/8" 3 ML MISC
1.0000 | 1 refills | Status: AC
Start: 2022-11-27 — End: 2023-05-26

## 2022-11-27 MED ORDER — CYANOCOBALAMIN 1000 MCG/ML IJ SOLN
1000.0000 ug | INTRAMUSCULAR | 1 refills | Status: DC
Start: 2022-11-27 — End: 2023-01-15

## 2022-11-27 NOTE — Telephone Encounter (Signed)
That sounds fine, thankd!

## 2022-11-27 NOTE — Telephone Encounter (Signed)
Noted. Sent in scripts and pt has apt for injection education on 11/12.

## 2022-11-27 NOTE — Telephone Encounter (Signed)
Pt tested INR at home today for first testing/education from [employer]. No changes will be made and pt is aware of this since she was in for INR check on 10/22.   Forwarding msg to PCP concerning B12 injections.

## 2022-12-02 ENCOUNTER — Other Ambulatory Visit: Payer: Self-pay | Admitting: Internal Medicine

## 2022-12-04 ENCOUNTER — Other Ambulatory Visit: Payer: Self-pay | Admitting: Family Medicine

## 2022-12-04 NOTE — Telephone Encounter (Signed)
Pt is overdue for her CPE (labs prior) please schedule and then route back to me, thanks

## 2022-12-04 NOTE — Telephone Encounter (Signed)
LVM for patient to c/b and schedule.  

## 2022-12-08 NOTE — Telephone Encounter (Signed)
Lvmtcb, sent mychart message  

## 2022-12-16 ENCOUNTER — Telehealth: Payer: Self-pay

## 2022-12-16 ENCOUNTER — Ambulatory Visit: Payer: Managed Care, Other (non HMO)

## 2022-12-16 NOTE — Telephone Encounter (Signed)
Pt called to [facility] coumadin clinic apt for today to next week due to pulling a muscle in her back this morning. Pt was also to receive self injection education for B12. [facility] for next week. Pt verbalized understanding.

## 2022-12-17 ENCOUNTER — Ambulatory Visit (INDEPENDENT_AMBULATORY_CARE_PROVIDER_SITE_OTHER): Payer: Managed Care, Other (non HMO)

## 2022-12-17 DIAGNOSIS — Z7901 Long term (current) use of anticoagulants: Secondary | ICD-10-CM

## 2022-12-17 LAB — POCT INR: INR: 1.7 — AB (ref 2.0–3.0)

## 2022-12-17 NOTE — Progress Notes (Signed)
Pt is testing INR at home. Received result on [employer] website. Increase dose today to take 2 tablets and increase dose tomorrow to take 2 1/2 tablets and then change weekly dose to take 1 1/2 tablet daily except take 2 tablets on Mondays, Wednesdays and Fridays and recheck on 11/21 or 11/22.  Contacted pt by phone and advised of dosing and recheck. Pt verbalized understanding.

## 2022-12-17 NOTE — Patient Instructions (Addendum)
Pre visit review using our clinic review tool, if applicable. No additional management support is needed unless otherwise documented below in the visit note.  Increase dose today to take 2 tablets and increase dose tomorrow to take 2 1/2 tablets and then change weekly dose to take 1 1/2 tablet daily except take 2 tablets on Mondays, Wednesdays and Fridays and recheck on 11/21 or 11/22.

## 2022-12-18 ENCOUNTER — Telehealth: Payer: Managed Care, Other (non HMO) | Admitting: Physician Assistant

## 2022-12-18 DIAGNOSIS — M545 Low back pain, unspecified: Secondary | ICD-10-CM | POA: Diagnosis not present

## 2022-12-18 MED ORDER — CYCLOBENZAPRINE HCL 10 MG PO TABS: 10.0000 mg | ORAL_TABLET | Freq: Three times a day (TID) | ORAL | 0 refills | Status: AC | PRN

## 2022-12-18 NOTE — Patient Instructions (Signed)
[name], thank you for joining Piedad Climes, PA-C for today's virtual visit.  While this provider is not your primary care provider (PCP), if your PCP is located in our provider database this encounter information will be shared with them immediately following your visit.   A [facility] MyChart account gives you access to today's visit and all your visits, tests, and labs performed at [facility] " click here if you don't have a [facility] MyChart account or go to [email or web address]  Consent: (Patient) [name] provided verbal consent for this virtual visit at the beginning of the encounter.  Current Medications:  Current Outpatient Medications:    cyclobenzaprine (FLEXERIL) 10 MG tablet, Take 1 tablet (10 mg total) by mouth 3 (three) times daily as needed., Disp: 15 tablet, Rfl: 0   albuterol (VENTOLIN HFA) 108 (90 Base) MCG/ACT inhaler, Inhale 1-2 puffs into the lungs every 6 (six) hours as needed for wheezing or shortness of breath., Disp: 6.7 g, Rfl: 0   amoxicillin (AMOXIL) 500 MG tablet, TAKE 4 TABS (2000 MG) 30-60 MIN BEFORE DENTAL PROCEDURE., Disp: 4 tablet, Rfl: 2   aspirin EC 81 MG EC tablet, Take 1 tablet (81 mg total) by mouth daily. Swallow whole., Disp: 30 tablet, Rfl: 11   bismuth subsalicylate (PEPTO BISMOL) 262 MG/15ML suspension, Take 30 mLs by mouth every 6 (six) hours as needed for indigestion or diarrhea or loose stools., Disp: , Rfl:    calcium carbonate (TUMS - DOSED IN MG ELEMENTAL CALCIUM) 500 MG chewable tablet, Chew 1-2 tablets by mouth 3 (three) times daily as needed for indigestion or heartburn., Disp: , Rfl:    cyanocobalamin (,VITAMIN B-12,) 1000 MCG/ML injection, Inject 1,000 mcg into the muscle every 30 (thirty) days. , Disp: , Rfl:    cyanocobalamin (VITAMIN B12) 1000 MCG/ML injection, Inject 1 mL (1,000 mcg total) into the muscle every 30 (thirty) days., Disp: 3 mL, Rfl: 1   ezetimibe (ZETIA) 10 MG tablet, Take 1 tablet  (10 mg total) by mouth daily., Disp: 90 tablet, Rfl: 3   furosemide (LASIX) 20 MG tablet, Take 20 mg by mouth daily., Disp: , Rfl:    hydroxychloroquine (PLAQUENIL) 200 MG tablet, Take 200 mg by mouth 2 (two) times daily., Disp: , Rfl:    levothyroxine (SYNTHROID) 175 MCG tablet, Take 1 tablet (175 mcg total) by mouth daily before breakfast., Disp: 90 tablet, Rfl: 0   metoprolol tartrate (LOPRESSOR) 50 MG tablet, TAKE 1 TABLET BY MOUTH TWICE A DAY, Disp: 180 tablet, Rfl: 2   Multiple Vitamins-Minerals (BARIATRIC MULTIVITAMINS/IRON) CAPS, Take 1 tablet by mouth daily., Disp: , Rfl:    NEEDLE, DISP, 25 G 25G X 1" MISC, 1 Needle by Does not apply route every 30 (thirty) days. USE TO INJECTION B12, Disp: 3 each, Rfl: 1   omeprazole (PRILOSEC) 40 MG capsule, Take 1 capsule (40 mg total) by mouth daily., Disp: 90 capsule, Rfl: 3   PARoxetine (PAXIL) 40 MG tablet, TAKE 1 TABLET BY MOUTH EVERY DAY IN THE MORNING, Disp: 90 tablet, Rfl: 3   rosuvastatin (CRESTOR) 40 MG tablet, TAKE 1 TABLET BY MOUTH EVERY DAY, Disp: 90 tablet, Rfl: 3   Syringe/Needle, Disp, (SYRINGE 3CC/25GX5/8") 25G X 5/8" 3 ML MISC, 1 Syringe by Does not apply route every 30 (thirty) days. USE TO DRAW UP B12, Disp: 3 each, Rfl: 1   warfarin (COUMADIN) 7.5 MG tablet, TAKE 1.5 TABLETS (11.25MG ) BY MOUTH DAILY OR AS DIRECTED BY ANTICOAGULATION CLINIC, Disp: 150  tablet, Rfl: 1   Medications ordered in this encounter:  Meds ordered this encounter  Medications   cyclobenzaprine (FLEXERIL) 10 MG tablet    Sig: Take 1 tablet (10 mg total) by mouth 3 (three) times daily as needed.    Dispense:  15 tablet    Refill:  0    Order Specific Question:   Supervising Provider    Answer:   Merrilee Jansky [id number]     *If you need refills on other medications prior to your next appointment, please contact your pharmacy*  Follow-Up: Call back or seek an in-person evaluation if the symptoms worsen or if the condition fails to improve as  anticipated.  [facility] [phone]  Other Instructions Avoid heavy lifting and overexertion. Continue heating pad. Ok to continue Tylenol OTC. Use the Flexeril no more than as directed. Follow-up in person for any non-resolving, new or worsening symptoms.   If you have been instructed to have an in-person evaluation today at a local Urgent Care facility, please use the link below. It will take you to a list of all of our available [facility], including address, phone number and hours of operation. Please do not delay care.  [facility]  If you or a family member do not have a primary care provider, use the link below to schedule a visit and establish care. When you choose a [facility] primary care physician or advanced practice provider, you gain a long-term partner in health. Find a Primary Care Provider  Learn more about [facility]'s in-office and virtual care options:  - Get Care Now

## 2022-12-18 NOTE — Progress Notes (Signed)
Virtual Visit Consent   [name], you are scheduled for a virtual visit with a [facility] provider today. Just as with appointments in the office, your consent must be obtained to participate. Your consent will be active for this visit and any virtual visit you may have with one of our providers in the next 365 days. If you have a MyChart account, a copy of this consent can be sent to you electronically.  As this is a virtual visit, video technology does not allow for your provider to perform a traditional examination. This may limit your provider's ability to fully assess your condition. If your provider identifies any concerns that need to be evaluated in person or the need to arrange testing (such as labs, EKG, etc.), we will make arrangements to do so. Although advances in technology are sophisticated, we cannot ensure that it will always work on either your end or our end. If the connection with a video visit is poor, the visit may have to be switched to a telephone visit. With either a video or telephone visit, we are not always able to ensure that we have a secure connection.  By engaging in this virtual visit, you consent to the provision of healthcare and authorize for your insurance to be billed (if applicable) for the services provided during this visit. Depending on your insurance coverage, you may receive a charge related to this service.  I need to obtain your verbal consent now. Are you willing to proceed with your visit today? [name] has provided verbal consent on 12/18/2022 for a virtual visit (video or telephone). Piedad Climes, [place]  Date: 12/18/2022 4:41 PM  Virtual Visit via Video Note   I, Piedad Climes, connected with  [name]  ([id number], 04-Nov-1965) on 12/18/22 at  4:45 PM EST by a video-enabled telemedicine application and verified that I am speaking with the correct person using two identifiers.  Location: Patient: Virtual Visit Location  Patient: Home Provider: Virtual Visit Location Provider: Home Office   I discussed the limitations of evaluation and management by telemedicine and the availability of in person appointments. The patient expressed understanding and agreed to proceed.    History of Present Illness: [name] is a 57 y.o. who identifies as a female who was assigned female at birth, and is being seen today for some left lower and mid back pain and muscular tension after reaching up to get something on Monday. Thinks she pulled a muscle. Since then noting symptoms that are worse with moving from sitting to standing position and sometimes with ambulation. Denies radiation of pain into the lower extremity. Has been taking Tylenol OTC.   HPI: HPI  Problems:  Patient Active Problem List   Diagnosis Date Noted   Globus sensation 10/23/2021   IDA (iron deficiency anemia) 06/28/2020   Iron deficiency anemia 06/18/2020   History of atrial fibrillation 06/18/2020   S/P aortic valve replacement with bioprosthetic valve 04/06/2020   Coronary artery disease 04/06/2020   Chest pain of uncertain etiology 01/17/2020   Esophageal diverticulum    Esophageal dysphagia    Fatigue 05/20/2019   Systemic lupus erythematosus with lung involvement (HCC) 01/04/2019   Aortic stenosis 08/15/2017   Vitamin B12 deficiency 11/15/2015   Nausea & vomiting 10/15/2015   Allergic rhinitis 07/01/2015   History of cerebral hemorrhage 07/01/2015   Chronic daily headache 06/29/2015   Screening for osteoporosis 11/04/2013   Encounter for therapeutic drug monitoring 03/25/2013  CKD (chronic kidney disease) stage 2, GFR 60-89 ml/min 12/20/2012   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   Routine general medical examination at a health care facility 12/07/2012   Steroid long-term use 12/07/2012   History of HPV infection 12/07/2012   Long term (current) use of anticoagulants 10/06/2012   Chronic anticoagulation 04/22/2012   S/P  bariatric surgery 04/22/2012   Homozygous Factor V Leiden mutation (HCC) 04/22/2012   OSA on CPAP 11/20/2011   Prediabetes 09/16/2010   EDEMA 01/08/2010   Pleural effusion 08/27/2009   Enlarged lymph nodes 08/13/2009   History of pulmonary embolism 05/24/2009   Morbid obesity (HCC) 12/08/2007   Hypothyroidism 06/09/2006   HYPERCHOLESTEROLEMIA 06/09/2006   Generalized anxiety disorder 06/09/2006   PANIC DISORDER 06/09/2006   Essential hypertension 06/09/2006   Nephritis and nephropathy, with pathological lesion in kidney 06/09/2006   Systemic lupus erythematosus (HCC) 06/09/2006   DVT, HX OF 06/09/2006    Allergies:  Allergies  Allergen Reactions   Lisinopril Cough   Lovenox [Enoxaparin Sodium] Itching   Moxifloxacin Other (See Comments)    REACTION: hallucinations   Lovenox [Enoxaparin]     Itching    Medications:  Current Outpatient Medications:    cyclobenzaprine (FLEXERIL) 10 MG tablet, Take 1 tablet (10 mg total) by mouth 3 (three) times daily as needed., Disp: 15 tablet, Rfl: 0   albuterol (VENTOLIN HFA) 108 (90 Base) MCG/ACT inhaler, Inhale 1-2 puffs into the lungs every 6 (six) hours as needed for wheezing or shortness of breath., Disp: 6.7 g, Rfl: 0   amoxicillin (AMOXIL) 500 MG tablet, TAKE 4 TABS (2000 MG) 30-60 MIN BEFORE DENTAL PROCEDURE., Disp: 4 tablet, Rfl: 2   aspirin EC 81 MG EC tablet, Take 1 tablet (81 mg total) by mouth daily. Swallow whole., Disp: 30 tablet, Rfl: 11   bismuth subsalicylate (PEPTO BISMOL) 262 MG/15ML suspension, Take 30 mLs by mouth every 6 (six) hours as needed for indigestion or diarrhea or loose stools., Disp: , Rfl:    calcium carbonate (TUMS - DOSED IN MG ELEMENTAL CALCIUM) 500 MG chewable tablet, Chew 1-2 tablets by mouth 3 (three) times daily as needed for indigestion or heartburn., Disp: , Rfl:    cyanocobalamin (,VITAMIN B-12,) 1000 MCG/ML injection, Inject 1,000 mcg into the muscle every 30 (thirty) days. , Disp: , Rfl:     cyanocobalamin (VITAMIN B12) 1000 MCG/ML injection, Inject 1 mL (1,000 mcg total) into the muscle every 30 (thirty) days., Disp: 3 mL, Rfl: 1   ezetimibe (ZETIA) 10 MG tablet, Take 1 tablet (10 mg total) by mouth daily., Disp: 90 tablet, Rfl: 3   furosemide (LASIX) 20 MG tablet, Take 20 mg by mouth daily., Disp: , Rfl:    hydroxychloroquine (PLAQUENIL) 200 MG tablet, Take 200 mg by mouth 2 (two) times daily., Disp: , Rfl:    levothyroxine (SYNTHROID) 175 MCG tablet, Take 1 tablet (175 mcg total) by mouth daily before breakfast., Disp: 90 tablet, Rfl: 0   metoprolol tartrate (LOPRESSOR) 50 MG tablet, TAKE 1 TABLET BY MOUTH TWICE A DAY, Disp: 180 tablet, Rfl: 2   Multiple Vitamins-Minerals (BARIATRIC MULTIVITAMINS/IRON) CAPS, Take 1 tablet by mouth daily., Disp: , Rfl:    NEEDLE, DISP, 25 G 25G X 1" MISC, 1 Needle by Does not apply route every 30 (thirty) days. USE TO INJECTION B12, Disp: 3 each, Rfl: 1   omeprazole (PRILOSEC) 40 MG capsule, Take 1 capsule (40 mg total) by mouth daily., Disp: 90 capsule, Rfl: 3   PARoxetine (PAXIL)  40 MG tablet, TAKE 1 TABLET BY MOUTH EVERY DAY IN THE MORNING, Disp: 90 tablet, Rfl: 3   rosuvastatin (CRESTOR) 40 MG tablet, TAKE 1 TABLET BY MOUTH EVERY DAY, Disp: 90 tablet, Rfl: 3   Syringe/Needle, Disp, (SYRINGE 3CC/25GX5/8") 25G X 5/8" 3 ML MISC, 1 Syringe by Does not apply route every 30 (thirty) days. USE TO DRAW UP B12, Disp: 3 each, Rfl: 1   warfarin (COUMADIN) 7.5 MG tablet, TAKE 1.5 TABLETS (11.25MG ) BY MOUTH DAILY OR AS DIRECTED BY ANTICOAGULATION CLINIC, Disp: 150 tablet, Rfl: 1  Observations/Objective: Patient is well-developed, well-nourished in no acute distress.  Resting comfortably at home.  Head is normocephalic, atraumatic.  No labored breathing. Speech is clear and coherent with logical content.  Patient is alert and oriented at baseline.   Assessment and Plan: 1. Acute left-sided low back pain without sciatica - cyclobenzaprine (FLEXERIL) 10 MG  tablet; Take 1 tablet (10 mg total) by mouth 3 (three) times daily as needed.  Dispense: 15 tablet; Refill: 0  Supportive measures and OTC medications reviewed with patient. Flexeril per orders. Follow-up in person for any non-resolving, new or worsening symptoms despite treatment.   Follow Up Instructions: I discussed the assessment and treatment plan with the patient. The patient was provided an opportunity to ask questions and all were answered. The patient agreed with the plan and demonstrated an understanding of the instructions.  A copy of instructions were sent to the patient via MyChart unless otherwise noted below.   The patient was advised to call back or seek an in-person evaluation if the symptoms worsen or if the condition fails to improve as anticipated.    Piedad Climes, PA-C

## 2022-12-22 ENCOUNTER — Telehealth: Payer: Self-pay | Admitting: Family Medicine

## 2022-12-22 DIAGNOSIS — E7800 Pure hypercholesterolemia, unspecified: Secondary | ICD-10-CM

## 2022-12-22 DIAGNOSIS — N182 Chronic kidney disease, stage 2 (mild): Secondary | ICD-10-CM

## 2022-12-22 DIAGNOSIS — E890 Postprocedural hypothyroidism: Secondary | ICD-10-CM

## 2022-12-22 DIAGNOSIS — R7303 Prediabetes: Secondary | ICD-10-CM

## 2022-12-22 DIAGNOSIS — I10 Essential (primary) hypertension: Secondary | ICD-10-CM

## 2022-12-22 DIAGNOSIS — E538 Deficiency of other specified B group vitamins: Secondary | ICD-10-CM

## 2022-12-22 DIAGNOSIS — D508 Other iron deficiency anemias: Secondary | ICD-10-CM

## 2022-12-22 NOTE — Telephone Encounter (Signed)
-----   Message from [place] English sent at 12/19/2022  1:16 PM EST ----- Regarding: Lab Wed 12/24/22 Hello,  Patient is coming in for CPE labs on Wednesday 12/24/22. Can we get orders please.   Thanks

## 2022-12-23 ENCOUNTER — Ambulatory Visit: Payer: Managed Care, Other (non HMO)

## 2022-12-23 ENCOUNTER — Ambulatory Visit (INDEPENDENT_AMBULATORY_CARE_PROVIDER_SITE_OTHER): Payer: Managed Care, Other (non HMO)

## 2022-12-23 DIAGNOSIS — E538 Deficiency of other specified B group vitamins: Secondary | ICD-10-CM

## 2022-12-23 MED ORDER — CYANOCOBALAMIN 1000 MCG/ML IJ SOLN
1000.0000 ug | Freq: Once | INTRAMUSCULAR | Status: AC
  Administered 2022-12-23: 1000 ug via INTRAMUSCULAR

## 2022-12-23 NOTE — Progress Notes (Addendum)
Pt in today for B12 injection education. Pt did not bring B12 so clinic B12 was used. Full instructions and demonstration of B12 injections was provided. Pt returned demonstration. Pt reports she has given herself injections in the past. Pt will continue injections on a one/month schedule. Pt is still waiting on injection syringes from pharmacy. Advised if anything else is needed to contact the clinic. Pt verbalized understanding.    Per orders of Dr. Okey Dupre in Dr. Lucretia Roers absence, injection of B12 given by Sherrie George. Patient tolerated injection well.

## 2022-12-24 ENCOUNTER — Other Ambulatory Visit: Payer: Managed Care, Other (non HMO)

## 2022-12-29 ENCOUNTER — Telehealth: Payer: Self-pay

## 2022-12-29 ENCOUNTER — Ambulatory Visit (INDEPENDENT_AMBULATORY_CARE_PROVIDER_SITE_OTHER): Payer: Managed Care, Other (non HMO)

## 2022-12-29 DIAGNOSIS — Z7901 Long term (current) use of anticoagulants: Secondary | ICD-10-CM | POA: Diagnosis not present

## 2022-12-29 LAB — POCT INR
INR: 3.7 — AB (ref 2.0–3.0)
INR: 3.9 — AB (ref 2.0–3.0)

## 2022-12-29 NOTE — Telephone Encounter (Signed)
Pt due for testing INR at home. LVM requesting pt test INR.

## 2022-12-29 NOTE — Patient Instructions (Addendum)
Pre visit review using our clinic review tool, if applicable. No additional management support is needed unless otherwise documented below in the visit note.  Reduce dose today to take 1 tablet and then change weekly dose to take 1 1/2 tablet daily except take 2 tablets on Wednesdays and recheck on 12/10.

## 2022-12-29 NOTE — Progress Notes (Signed)
Pt is testing INR at home. Received result on [employer] website. Reduce dose today to take 1 tablet and then change weekly dose to take 1 1/2 tablet daily except take 2 tablets on Wednesdays and recheck on 12/10.  Contacted pt by phone and advised of dosing and recheck. Pt verbalized understanding.

## 2022-12-30 ENCOUNTER — Encounter: Payer: Managed Care, Other (non HMO) | Admitting: Family Medicine

## 2023-01-02 ENCOUNTER — Other Ambulatory Visit: Payer: Self-pay | Admitting: Family Medicine

## 2023-01-08 ENCOUNTER — Other Ambulatory Visit: Payer: Managed Care, Other (non HMO)

## 2023-01-15 ENCOUNTER — Other Ambulatory Visit: Payer: Self-pay

## 2023-01-15 ENCOUNTER — Ambulatory Visit: Payer: Managed Care, Other (non HMO) | Admitting: Family Medicine

## 2023-01-15 ENCOUNTER — Encounter: Payer: Self-pay | Admitting: Family Medicine

## 2023-01-15 VITALS — BP 138/89 | HR 67 | Temp 98.2°F | Ht 63.5 in | Wt 360.1 lb

## 2023-01-15 DIAGNOSIS — F411 Generalized anxiety disorder: Secondary | ICD-10-CM

## 2023-01-15 DIAGNOSIS — E890 Postprocedural hypothyroidism: Secondary | ICD-10-CM

## 2023-01-15 DIAGNOSIS — R7303 Prediabetes: Secondary | ICD-10-CM | POA: Diagnosis not present

## 2023-01-15 DIAGNOSIS — D508 Other iron deficiency anemias: Secondary | ICD-10-CM

## 2023-01-15 DIAGNOSIS — E538 Deficiency of other specified B group vitamins: Secondary | ICD-10-CM

## 2023-01-15 DIAGNOSIS — Z23 Encounter for immunization: Secondary | ICD-10-CM | POA: Diagnosis not present

## 2023-01-15 DIAGNOSIS — N182 Chronic kidney disease, stage 2 (mild): Secondary | ICD-10-CM

## 2023-01-15 DIAGNOSIS — D6851 Activated protein C resistance: Secondary | ICD-10-CM

## 2023-01-15 DIAGNOSIS — E7800 Pure hypercholesterolemia, unspecified: Secondary | ICD-10-CM | POA: Diagnosis not present

## 2023-01-15 DIAGNOSIS — Z0000 Encounter for general adult medical examination without abnormal findings: Secondary | ICD-10-CM

## 2023-01-15 DIAGNOSIS — I10 Essential (primary) hypertension: Secondary | ICD-10-CM | POA: Diagnosis not present

## 2023-01-15 DIAGNOSIS — Z1159 Encounter for screening for other viral diseases: Secondary | ICD-10-CM | POA: Insufficient documentation

## 2023-01-15 DIAGNOSIS — M3213 Lung involvement in systemic lupus erythematosus: Secondary | ICD-10-CM

## 2023-01-15 LAB — CBC WITH DIFFERENTIAL/PLATELET
Basophils Absolute: 0 10*3/uL (ref 0.0–0.1)
Basophils Relative: 1.2 % (ref 0.0–3.0)
Eosinophils Absolute: 0.2 10*3/uL (ref 0.0–0.7)
Eosinophils Relative: 5.7 % — ABNORMAL HIGH (ref 0.0–5.0)
HCT: 41.3 % (ref 36.0–46.0)
Hemoglobin: 13.2 g/dL (ref 12.0–15.0)
Lymphocytes Relative: 31.1 % (ref 12.0–46.0)
Lymphs Abs: 1.2 10*3/uL (ref 0.7–4.0)
MCHC: 32 g/dL (ref 30.0–36.0)
MCV: 85.5 fL (ref 78.0–100.0)
Monocytes Absolute: 0.3 10*3/uL (ref 0.1–1.0)
Monocytes Relative: 7.8 % (ref 3.0–12.0)
Neutro Abs: 2.1 10*3/uL (ref 1.4–7.7)
Neutrophils Relative %: 54.2 % (ref 43.0–77.0)
Platelets: 176 10*3/uL (ref 150.0–400.0)
RBC: 4.83 Mil/uL (ref 3.87–5.11)
RDW: 16.1 % — ABNORMAL HIGH (ref 11.5–15.5)
WBC: 4 10*3/uL (ref 4.0–10.5)

## 2023-01-15 LAB — VITAMIN D 25 HYDROXY (VIT D DEFICIENCY, FRACTURES): VITD: 24.71 ng/mL — ABNORMAL LOW (ref 30.00–100.00)

## 2023-01-15 LAB — COMPREHENSIVE METABOLIC PANEL
ALT: 15 U/L (ref 0–35)
AST: 21 U/L (ref 0–37)
Albumin: 4.1 g/dL (ref 3.5–5.2)
Alkaline Phosphatase: 74 U/L (ref 39–117)
BUN: 11 mg/dL (ref 6–23)
CO2: 31 meq/L (ref 19–32)
Calcium: 9.6 mg/dL (ref 8.4–10.5)
Chloride: 103 meq/L (ref 96–112)
Creatinine, Ser: 1.04 mg/dL (ref 0.40–1.20)
GFR: 60.11 mL/min (ref >60.00)
Glucose, Bld: 101 mg/dL — ABNORMAL HIGH (ref 70–99)
Potassium: 4.8 meq/L (ref 3.5–5.1)
Sodium: 141 meq/L (ref 135–145)
Total Bilirubin: 0.5 mg/dL (ref 0.2–1.2)
Total Protein: 7.2 g/dL (ref 6.0–8.3)

## 2023-01-15 LAB — LIPID PANEL
Cholesterol: 161 mg/dL (ref 0–200)
HDL: 55.4 mg/dL (ref >39.00)
LDL Cholesterol: 84 mg/dL (ref 0–99)
NonHDL: 105.64
Total CHOL/HDL Ratio: 3
Triglycerides: 109 mg/dL (ref 0.0–149.0)
VLDL: 21.8 mg/dL (ref 0.0–40.0)

## 2023-01-15 LAB — TSH: TSH: 2.48 u[IU]/mL (ref 0.35–5.50)

## 2023-01-15 LAB — IRON: Iron: 69 ug/dL (ref 42–145)

## 2023-01-15 LAB — LAB REPORT - SCANNED: EGFR: 60

## 2023-01-15 LAB — VITAMIN B12: Vitamin B-12: 943 pg/mL — ABNORMAL HIGH (ref 211–911)

## 2023-01-15 LAB — HEMOGLOBIN A1C: Hgb A1c MFr Bld: 6.4 % (ref 4.6–6.5)

## 2023-01-15 MED ORDER — OMEPRAZOLE 40 MG PO CPDR: 40.0000 mg | DELAYED_RELEASE_CAPSULE | Freq: Every day | ORAL | 3 refills | Status: DC

## 2023-01-15 MED ORDER — CYANOCOBALAMIN 1000 MCG/ML IJ SOLN: 1000.0000 ug | INTRAMUSCULAR | Status: DC

## 2023-01-15 NOTE — Assessment & Plan Note (Signed)
Lipid panel today Disc goals for lipids and reasons to control them Rev last labs with pt Rev low sat fat diet in detail  Takes crestor 40 mg daily with zetia 10 mg daily  Sees cardiology   Some leg pain recently -? If due to statin or other Normal periph pulses

## 2023-01-15 NOTE — Assessment & Plan Note (Signed)
Continues anticoag (warfarin ) indefinitely

## 2023-01-15 NOTE — Assessment & Plan Note (Signed)
Continues plaquenil and rheum care

## 2023-01-15 NOTE — Assessment & Plan Note (Signed)
TSH today  Continues levothyroxine 175 mcg daily  No clinical changes besides weight gain

## 2023-01-15 NOTE — Assessment & Plan Note (Signed)
bp in fair control at this time  BP Readings from Last 1 Encounters:  01/15/23 138/89   No changes needed Most recent labs reviewed  Disc lifstyle change with low sodium diet and exercise  metoprool 50 mg bid

## 2023-01-15 NOTE — Assessment & Plan Note (Signed)
Stable with paxil 40 mg daily  Some stressors  Needs more time for self care

## 2023-01-15 NOTE — Progress Notes (Signed)
Subjective:    Patient ID: [name], female    DOB: Dec 09, 1965, 57 y.o.   MRN: [id number]  HPI  Here for health maintenance exam and to review chronic medical problems   Wt Readings from Last 3 Encounters:  01/15/23 (!) 360 lb 2 oz (163.4 kg)  07/14/22 (!) 330 lb (149.7 kg)  05/06/22 (!) 348 lb 12.8 oz (158.2 kg)   62.79 kg/m  Vitals:   01/15/23 1023 01/15/23 1059  BP: (!) 144/90 138/89  Pulse: 67   Temp: 98.2 F (36.8 C)   SpO2: 97%     Immunization History  Administered Date(s) Administered   Influenza Whole 12/25/2008, 11/12/2010, 11/08/2011   Influenza, Seasonal, Injecte, Preservative Fre 01/15/2023   Influenza,inj,Quad PF,6+ Mos 12/07/2012, 03/04/2016, 11/16/2017, 01/04/2019, 10/23/2020   Influenza-Unspecified 11/03/2013   PFIZER(Purple Top)SARS-COV-2 Vaccination 04/16/2019, 05/07/2019, 01/06/2020   Pfizer Covid-19 Vaccine Bivalent Booster 35yrs & up 01/23/2021   Tdap 02/28/2014    Health Maintenance Due  Topic Date Due   Hepatitis C Screening  Never done   Cervical Cancer Screening (HPV/Pap Cotest)  05/05/2007   Colonoscopy  Never done   Doing ok  Feeling fair  Working from home (not a lot of exercise) Has grand child on and off   Position is being eliminated at work  (35 years at same company)  Mixed blessing  Will have some time to start working on herself / sell house and move closer to family  Needs to put her health first    Legs are aching more lately  Hard to move her knees   Saw doctor for left ankle this summer - xrays were ok / ? Sprain    Hep c screen-will do   Shingrix-declines  Flu shot -today    Mammogram  - [facility] Self breast exam  Gyn health Pap - [facility]   Colon cancer screening  Did not get her colonoscopy  Last colonoscopy 2018- thinks polyps / [facility]   Needs a referral    Bone health  Dexa 2015 in the normal range No longer on prednisone  Falls-none   Fractures-none  Supplements  Last vitamin D Lab Results  Component Value Date   VD25OH 22.34 (L) 11/14/2015    Exercise  Was trying to exercise - exercise bike (now hurts her knees)  Now leg and knee pain limits her    Mood    01/15/2023   10:29 AM 10/23/2021   11:10 AM 09/20/2020   10:02 AM 01/04/2019    4:18 PM 11/16/2017    3:48 PM  Depression screen PHQ 2/9  Decreased Interest 1 0 0 0 1  Down, Depressed, Hopeless 1 0 0 0 0  PHQ - 2 Score 2 0 0 0 1  Altered sleeping 1 1  1    Tired, decreased energy 3 1  1    Change in appetite 2 1  1    Feeling bad or failure about yourself  0 0  1   Trouble concentrating 0 0  0   Moving slowly or fidgety/restless 0 0  0   Suicidal thoughts 0 0  0   PHQ-9 Score 8 3  4    Difficult doing work/chores Very difficult       Continues paxil for GAD 40 mg daily   HTN In setting of past aortic valve replacement and CAD and history of a fib  bp is stable today  No  cp or palpitations or headaches or edema  No side effects to medicines  BP Readings from Last 3 Encounters:  01/15/23 138/89  05/06/22 124/72  10/23/21 124/76    Lasix 20 mg daily  Metoprolol 50 mg bid  Asa 81 mg   Pulse Readings from Last 3 Encounters:  01/15/23 67  05/06/22 76  10/23/21 78   Due for labs   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  Lab Results  Component Value Date   NA 141 10/16/2021   K 4.3 10/16/2021   CO2 31 10/16/2021   GLUCOSE 94 10/16/2021   BUN 14 10/16/2021   CREATININE 1.00 10/16/2021   CALCIUM 9.3 10/16/2021   GFR 63.56 10/16/2021   EGFR 69.0 04/30/2022   GFRNONAA >60 08/26/2021    Due for labs   Takes warfarin for valve repl and factor V with past DVT   Hypothyroidism s/p ablation  Pt has no clinical changes No change in energy level/ hair or skin/ edema and no tremor Lab Results  Component Value Date   TSH 0.70 12/04/2021   Levothyroxine 175 mcg daily  Due for labs   GERD Omeprazole 40 mg daily   Iron def anemia Has seen hematology Has had  iron infusion   Prediabetes Lab Results  Component Value Date   HGBA1C 6.3 10/16/2021   B12 def Lab Results  Component Value Date   VITAMINB12 830 10/16/2021   Labs for all today   Hyperlipidemia Takes crestor 40 mg daily  Zetia 10 mg  Lab Results  Component Value Date   CHOL 144 10/16/2021   HDL 51.30 10/16/2021   LDLCALC 71 10/16/2021   LDLDIRECT 203.7 06/03/2011   TRIG 107.0 10/16/2021   CHOLHDL 3 10/16/2021   SLE Plaquenil  Due for a check in  No prednisone right now      Patient Active Problem List   Diagnosis Date Noted   Encounter for hepatitis C screening test for low risk patient 01/15/2023   Globus sensation 10/23/2021   IDA (iron deficiency anemia) 06/28/2020   Iron deficiency anemia 06/18/2020   History of atrial fibrillation 06/18/2020   S/P aortic valve replacement with bioprosthetic valve 04/06/2020   Coronary artery disease 04/06/2020   Chest pain of uncertain etiology 01/17/2020   Esophageal diverticulum    Esophageal dysphagia    Fatigue 05/20/2019   Systemic lupus erythematosus with lung involvement (HCC) 01/04/2019   Aortic stenosis 08/15/2017   Vitamin B12 deficiency 11/15/2015   Allergic rhinitis 07/01/2015   History of cerebral hemorrhage 07/01/2015   Chronic daily headache 06/29/2015   Encounter for therapeutic drug monitoring 03/25/2013   CKD (chronic kidney disease) stage 2, GFR 60-89 ml/min 12/20/2012   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   Routine general medical examination at a health care facility 12/07/2012   History of HPV infection 12/07/2012   Long term (current) use of anticoagulants 10/06/2012   Chronic anticoagulation 04/22/2012   S/P bariatric surgery 04/22/2012   Homozygous Factor V Leiden mutation (HCC) 04/22/2012   OSA on CPAP 11/20/2011   Prediabetes 09/16/2010   EDEMA 01/08/2010   Enlarged lymph nodes 08/13/2009   History of pulmonary embolism 05/24/2009   Morbid obesity (HCC) 12/08/2007    Hypothyroidism 06/09/2006   HYPERCHOLESTEROLEMIA 06/09/2006   Generalized anxiety disorder 06/09/2006   PANIC DISORDER 06/09/2006   Essential hypertension 06/09/2006   Nephritis and nephropathy, with pathological lesion in kidney 06/09/2006   Systemic lupus erythematosus (HCC) 06/09/2006   DVT, HX OF 06/09/2006   Past  Medical History:  Diagnosis Date   Aortic stenosis    s/p AVR // Echocardiogram 4/22: EF 60-65, no RWMA, mild LVH, Gr 2 DD, normal RVSF, RVSP 22.4, mod MAC, AVR with mean 17 mmHg   Arthritis    Lupus - hands/knees   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    Loculated-chronic on Left-VanTright; s/p VATS 5/10   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   GERD (gastroesophageal reflux disease)    on prilosec r/t gastric sleeve surgery   HLD (hyperlipidemia)    Iron deficiency anemia    IV dextran [place]   Nephritis and nephropathy, not specified as acute or chronic, with unspecified pathological lesion in kidney    Nonspecific abnormal results of liver function study    Obesity, unspecified    Other specified acquired hypothyroidism    Panic disorder without agoraphobia    Personal history of venous thrombosis and embolism 2002   during pregnancy; ?factor 5 leiden (sees heme)   Pleural effusion 2005   c/w lupus initial w/u; recurrent Right as pred tapered off July 2011   Sleep apnea    Systemic lupus erythematosus (HCC)    renal GN, hx of pericardial eff in late 90's   Unspecified essential hypertension    Past Surgical History:  Procedure Laterality Date   AORTIC VALVE REPLACEMENT N/A 04/06/2020   Procedure: AORTIC VALVE REPLACEMENT (AVR) USING INSPIRIS 21 MM AORTIC VALVE;  Surgeon: Alleen Borne, MD;  Location: [facility] OR;  Service: Open Heart Surgery;  Laterality: N/A;   BIOPSY  04/12/2019   Procedure: BIOPSY;  Surgeon: Tressia Danas, MD;  Location: [facility];  Service: Gastroenterology;;   CARDIAC CATHETERIZATION     ESOPHAGEAL MANOMETRY N/A  07/15/2019   Procedure: ESOPHAGEAL MANOMETRY (EM);  Surgeon: Napoleon Form, MD;  Location: [facility];  Service: Endoscopy;  Laterality: N/A;   ESOPHAGOGASTRODUODENOSCOPY (EGD) WITH PROPOFOL N/A 04/12/2019   Procedure: ESOPHAGOGASTRODUODENOSCOPY (EGD) WITH PROPOFOL;  Surgeon: Tressia Danas, MD;  Location: [facility];  Service: Gastroenterology;  Laterality: N/A;   gastric sleeve  09/04/2010   bariatric surgery Dr Adolphus Birchwood    LAPAROSCOPIC TUBAL LIGATION  12/09/2010   Procedure: LAPAROSCOPIC TUBAL LIGATION;  Surgeon: Juluis Mire;  Location: [facility];  Service: Gynecology;  Laterality: Bilateral;  Attempted see Nursing note   pleurocentesis  10/04/2004   Pleuryx catheter placement  10/04/2009   VanTright----removed 9/11   RENAL BIOPSY  09/24/2012   RIGHT/LEFT HEART CATH AND CORONARY ANGIOGRAPHY N/A 01/19/2020   Procedure: RIGHT/LEFT HEART CATH AND CORONARY ANGIOGRAPHY;  Surgeon: Runell Gess, MD;  Location: [facility];  Service: Cardiovascular;  Laterality: N/A;   svd     x 3   TEE WITHOUT CARDIOVERSION N/A 04/06/2020   Procedure: TRANSESOPHAGEAL ECHOCARDIOGRAM (TEE);  Surgeon: Alleen Borne, MD;  [facility];  Service: Open Heart Surgery;  Laterality: N/A;   THORACENTESIS  02/04/2008   with penumonia   WISDOM TOOTH EXTRACTION     Social History   Tobacco Use   Smoking status: Former    Current packs/day: 0.00    Average packs/day: 0.5 packs/day for 10.0 years (5.0 ttl pk-yrs)    Types: Cigarettes    Start date: 05/05/1999    Quit date: 05/04/2009    Years since quitting: 13.7   Smokeless tobacco: Never   Tobacco comments:    Socially x10 years (1 pack/month)  Vaping Use   Vaping status: Never Used  Substance Use Topics  Alcohol use: Not Currently    Alcohol/week: 0.0 standard drinks of alcohol    Comment: rare   Drug use: No   Family History  Problem Relation Age of Onset   Lupus Sister    Hypertension Father    Hyperlipidemia Father     Leukemia Sister    Diabetes Other        GM   Colon cancer Neg Hx    Esophageal cancer Neg Hx    Pancreatic cancer Neg Hx    Allergies  Allergen Reactions   Lisinopril Cough   Lovenox [Enoxaparin Sodium] Itching   Moxifloxacin Other (See Comments)    REACTION: hallucinations   Lovenox [Enoxaparin]     Itching    Current Outpatient Medications on File Prior to Visit  Medication Sig Dispense Refill   albuterol (VENTOLIN HFA) 108 (90 Base) MCG/ACT inhaler Inhale 1-2 puffs into the lungs every 6 (six) hours as needed for wheezing or shortness of breath. 6.7 g 0   amoxicillin (AMOXIL) 500 MG tablet TAKE 4 TABS (2000 MG) 30-60 MIN BEFORE DENTAL PROCEDURE. 4 tablet 2   aspirin EC 81 MG EC tablet Take 1 tablet (81 mg total) by mouth daily. Swallow whole. 30 tablet 11   bismuth subsalicylate (PEPTO BISMOL) 262 MG/15ML suspension Take 30 mLs by mouth every 6 (six) hours as needed for indigestion or diarrhea or loose stools.     calcium carbonate (TUMS - DOSED IN MG ELEMENTAL CALCIUM) 500 MG chewable tablet Chew 1-2 tablets by mouth 3 (three) times daily as needed for indigestion or heartburn.     cyanocobalamin (,VITAMIN B-12,) 1000 MCG/ML injection Inject 1,000 mcg into the muscle every 30 (thirty) days.      cyanocobalamin (VITAMIN B12) 1000 MCG/ML injection Inject 1 mL (1,000 mcg total) into the muscle every 30 (thirty) days. 3 mL 1   cyclobenzaprine (FLEXERIL) 10 MG tablet Take 1 tablet (10 mg total) by mouth 3 (three) times daily as needed. 15 tablet 0   ezetimibe (ZETIA) 10 MG tablet TAKE 1 TABLET BY MOUTH EVERY DAY 90 tablet 0   furosemide (LASIX) 20 MG tablet Take 20 mg by mouth daily.     hydroxychloroquine (PLAQUENIL) 200 MG tablet Take 200 mg by mouth 2 (two) times daily.     levothyroxine (SYNTHROID) 175 MCG tablet TAKE 1 TABLET BY MOUTH DAILY BEFORE BREAKFAST. 90 tablet 0   metoprolol tartrate (LOPRESSOR) 50 MG tablet TAKE 1 TABLET BY MOUTH TWICE A DAY 180 tablet 2   Multiple  Vitamins-Minerals (BARIATRIC MULTIVITAMINS/IRON) CAPS Take 1 tablet by mouth daily.     NEEDLE, DISP, 25 G 25G X 1" MISC 1 Needle by Does not apply route every 30 (thirty) days. USE TO INJECTION B12 3 each 1   PARoxetine (PAXIL) 40 MG tablet TAKE 1 TABLET BY MOUTH EVERY DAY IN THE MORNING 90 tablet 0   rosuvastatin (CRESTOR) 40 MG tablet TAKE 1 TABLET BY MOUTH EVERY DAY 90 tablet 3   Syringe/Needle, Disp, (SYRINGE 3CC/25GX5/8") 25G X 5/8" 3 ML MISC 1 Syringe by Does not apply route every 30 (thirty) days. USE TO DRAW UP B12 3 each 1   warfarin (COUMADIN) 7.5 MG tablet TAKE 1.5 TABLETS (11.25MG ) BY MOUTH DAILY OR AS DIRECTED BY ANTICOAGULATION CLINIC 150 tablet 1   No current facility-administered medications on file prior to visit.    Review of Systems  Constitutional:  Positive for fatigue. Negative for activity change, appetite change, fever and unexpected weight change.  HENT:  Negative for congestion, ear pain, rhinorrhea, sinus pressure and sore throat.   Eyes:  Negative for pain, redness and visual disturbance.  Respiratory:  Negative for cough, shortness of breath and wheezing.   Cardiovascular:  Negative for chest pain and palpitations.  Gastrointestinal:  Negative for abdominal pain, blood in stool, constipation and diarrhea.  Endocrine: Negative for polydipsia and polyuria.  Genitourinary:  Negative for dysuria, frequency and urgency.  Musculoskeletal:  Negative for arthralgias, back pain and myalgias.  Skin:  Negative for pallor and rash.  Allergic/Immunologic: Negative for environmental allergies.  Neurological:  Negative for dizziness, syncope and headaches.  Hematological:  Negative for adenopathy. Does not bruise/bleed easily.  Psychiatric/Behavioral:  Positive for dysphoric mood. Negative for decreased concentration. The patient is nervous/anxious.        Objective:   Physical Exam Constitutional:      General: She is not in acute distress.    Appearance: Normal  appearance. She is well-developed. She is obese. She is not ill-appearing or diaphoretic.  HENT:     Head: Normocephalic and atraumatic.     Right Ear: Tympanic membrane, ear canal and external ear normal.     Left Ear: Tympanic membrane, ear canal and external ear normal.     Nose: Nose normal. No congestion.     Mouth/Throat:     Mouth: Mucous membranes are moist.     Pharynx: Oropharynx is clear. No posterior oropharyngeal erythema.  Eyes:     General: No scleral icterus.    Extraocular Movements: Extraocular movements intact.     Conjunctiva/sclera: Conjunctivae normal.     Pupils: Pupils are equal, round, and reactive to light.  Neck:     Thyroid: No thyromegaly.     Vascular: No carotid bruit or JVD.  Cardiovascular:     Rate and Rhythm: Normal rate and regular rhythm.     Pulses: Normal pulses.     Heart sounds: Normal heart sounds.     No gallop.  Pulmonary:     Effort: Pulmonary effort is normal. No respiratory distress.     Breath sounds: Normal breath sounds. No wheezing.     Comments: Good air exch Chest:     Chest wall: No tenderness.  Abdominal:     General: Bowel sounds are normal. There is no distension or abdominal bruit.     Palpations: Abdomen is soft. There is no mass.     Tenderness: There is no abdominal tenderness.     Hernia: No hernia is present.  Genitourinary:    Comments: Breast exam: No mass, nodules, thickening, tenderness, bulging, retraction, inflamation, nipple discharge or skin changes noted.  No axillary or clavicular LA.     Musculoskeletal:        General: No tenderness. Normal range of motion.     Cervical back: Normal range of motion and neck supple. No rigidity. No muscular tenderness.     Right lower leg: No edema.     Left lower leg: No edema.     Comments: No kyphosis   Lymphadenopathy:     Cervical: No cervical adenopathy.  Skin:    General: Skin is warm and dry.     Coloration: Skin is not pale.     Findings: No erythema or  rash.     Comments: Solar lentigines diffusely Some sks   Neurological:     Mental Status: She is alert. Mental status is at baseline.     Cranial Nerves: No cranial nerve deficit.  Motor: No abnormal muscle tone.     Coordination: Coordination normal.     Gait: Gait normal.     Deep Tendon Reflexes: Reflexes are normal and symmetric. Reflexes normal.  Psychiatric:        Mood and Affect: Mood normal.        Cognition and Memory: Cognition and memory normal.           Assessment & Plan:   Problem List Items Addressed This Visit       Cardiovascular and Mediastinum   Essential hypertension   bp in fair control at this time  BP Readings from Last 1 Encounters:  01/15/23 138/89   No changes needed Most recent labs reviewed  Disc lifstyle change with low sodium diet and exercise  metoprool 50 mg bid          Endocrine   Hypothyroidism   TSH today  Continues levothyroxine 175 mcg daily  No clinical changes besides weight gain         Genitourinary   CKD (chronic kidney disease) stage 2, GFR 60-89 ml/min   Sees nephrology  Has SLE  Lab today Encouraged good hydration         Hematopoietic and Hemostatic   Homozygous Factor V Leiden mutation (HCC)   Continues anticoag (warfarin ) indefinitely        Other   Vitamin B12 deficiency   B12 level today  Monthly shots       Systemic lupus erythematosus (HCC)   Continues plaquenil and rheum care       Routine general medical examination at a health care facility - Primary   Reviewed health habits including diet and exercise and skin cancer prevention Reviewed appropriate screening tests for age  Also reviewed health mt list, fam hx and immunization status , as well as social and family history   See HPI Labs reviewed and ordered Hep C screen today Fu shot given Declines shigrix  Sent for pap/ mammo  Sent for colonsocopy report (2018 at [place])? , 5 y recall? - may need referral Dexa 2015 normal   Discussed fall prevention, supplements and exercise for bone density  PHQ 8- worse lately in setting of stressors, continues paxil  Health Maintenance  Topic Date Due   Hepatitis C Screening  Never done   Pap with HPV screening  05/05/2007   Colon Cancer Screening  Never done   Zoster (Shingles) Vaccine (1 of 2) 04/15/2023*   COVID-19 Vaccine (5 - 2024-25 season) 01/31/2024*   Mammogram  06/23/2023   DTaP/Tdap/Td vaccine (2 - Td or Tdap) 02/29/2024   Flu Shot  Completed   HIV Screening  Completed   HPV Vaccine  Aged Out  *Topic was postponed. The date shown is not the original due date.         Prediabetes   A1c today  Weight gain noted disc imp of low glycemic diet and wt loss to prevent DM2       Morbid obesity (HCC)   Discussed how this problem influences overall health and the risks it imposes  Reviewed plan for weight loss with lower calorie diet (via better food choices (lower glycemic and portion control) along with exercise building up to or more than 30 minutes 5 days per week including some aerobic activity and strength training   Has had bariatric surgery but gained weight back Multiple co morbidities ? If candidate for glp-1 drug in future if covered by ins  Iron deficiency anemia   Sees hematology  Iron inf in past        HYPERCHOLESTEROLEMIA   Lipid panel today Disc goals for lipids and reasons to control them Rev last labs with pt Rev low sat fat diet in detail  Takes crestor 40 mg daily with zetia 10 mg daily  Sees cardiology   Some leg pain recently -? If due to statin or other Normal periph pulses       Generalized anxiety disorder   Stable with paxil 40 mg daily  Some stressors  Needs more time for self care       Encounter for hepatitis C screening test for low risk patient   Hep C screen today      Relevant Orders   Hepatitis C Antibody   Other Visit Diagnoses       Need for influenza vaccination       Relevant Orders    Flu vaccine trivalent PF, 6mos and older(Flulaval,Afluria,Fluarix,Fluzone) (Completed)

## 2023-01-15 NOTE — Assessment & Plan Note (Signed)
Discussed how this problem influences overall health and the risks it imposes  Reviewed plan for weight loss with lower calorie diet (via better food choices (lower glycemic and portion control) along with exercise building up to or more than 30 minutes 5 days per week including some aerobic activity and strength training   Has had bariatric surgery but gained weight back Multiple co morbidities ? If candidate for glp-1 drug in future if covered by ins

## 2023-01-15 NOTE — Assessment & Plan Note (Signed)
Sees nephrology  Has SLE  Lab today Encouraged good hydration

## 2023-01-15 NOTE — Assessment & Plan Note (Signed)
B12 level today  Monthly shots

## 2023-01-15 NOTE — Assessment & Plan Note (Addendum)
Reviewed health habits including diet and exercise and skin cancer prevention Reviewed appropriate screening tests for age  Also reviewed health mt list, fam hx and immunization status , as well as social and family history   See HPI Labs reviewed and ordered Hep C screen today Fu shot given Declines shigrix  Sent for pap/ mammo  Sent for colonsocopy report (2018 at [place])? , 5 y recall? - may need referral Dexa 2015 normal  Discussed fall prevention, supplements and exercise for bone density  PHQ 8- worse lately in setting of stressors, continues paxil  Health Maintenance  Topic Date Due   Hepatitis C Screening  Never done   Pap with HPV screening  05/05/2007   Colon Cancer Screening  Never done   Zoster (Shingles) Vaccine (1 of 2) 04/15/2023*   COVID-19 Vaccine (5 - 2024-25 season) 01/31/2024*   Mammogram  06/23/2023   DTaP/Tdap/Td vaccine (2 - Td or Tdap) 02/29/2024   Flu Shot  Completed   HIV Screening  Completed   HPV Vaccine  Aged Out  *Topic was postponed. The date shown is not the original due date.

## 2023-01-15 NOTE — Patient Instructions (Addendum)
We will send for last colonoscopy report from [facility] then do a referral for the next one   Be as active as you can be  Lots of chair exercise programs   Add some strength training to your routine, this is important for bone and brain health and can reduce your risk of falls and help your body use insulin properly and regulate weight  Light weights, exercise bands , and internet videos are a good way to start  Yoga (chair or regular), machines , floor exercises or a gym with machines are also good options   Keep an eye on your blood pressure with cardiology   Try to get back on track with diet  Try to get most of your carbohydrates from produce (with the exception of white potatoes) and whole grains Eat less bread/pasta/rice/snack foods/cereals/sweets and other items from the middle of the grocery store (processed carbs)  Labs today  Flu shot today

## 2023-01-15 NOTE — Assessment & Plan Note (Signed)
Hep C screen today 

## 2023-01-15 NOTE — Assessment & Plan Note (Signed)
Sees hematology  Iron inf in past

## 2023-01-15 NOTE — Assessment & Plan Note (Signed)
A1c today  Weight gain noted disc imp of low glycemic diet and wt loss to prevent DM2

## 2023-01-16 ENCOUNTER — Telehealth: Payer: Self-pay

## 2023-01-16 LAB — HEPATITIS C ANTIBODY: Hepatitis C Ab: NONREACTIVE

## 2023-01-16 NOTE — Telephone Encounter (Signed)
It is time for pt to test INR. She tests at home. LVM requesting pt test INR.

## 2023-01-19 NOTE — Telephone Encounter (Signed)
LVM

## 2023-01-21 NOTE — Telephone Encounter (Signed)
LMV;

## 2023-01-22 ENCOUNTER — Ambulatory Visit (INDEPENDENT_AMBULATORY_CARE_PROVIDER_SITE_OTHER): Payer: Managed Care, Other (non HMO)

## 2023-01-22 DIAGNOSIS — Z7901 Long term (current) use of anticoagulants: Secondary | ICD-10-CM | POA: Diagnosis not present

## 2023-01-22 LAB — POCT INR: INR: 4.5 — AB (ref 2.0–3.0)

## 2023-01-22 NOTE — Patient Instructions (Addendum)
Pre visit review using our clinic review tool, if applicable. No additional management support is needed unless otherwise documented below in the visit note.  Hold dose today and reduce dose tomorrow to take 1 tablet and then change weekly dose to take 1 1/2 tablet daily except take 1 tablets on Wednesdays and recheck on 1/2.

## 2023-01-22 NOTE — Progress Notes (Signed)
Pt is testing INR at home. Received result on [employer] website. Pt reports bilateral knee edema. She reports she was advised to take lasix and has not been taking it. Advised to start and take as directed because edema will severely increase INR and cause a bleeding risk. Hold dose today and reduce dose tomorrow to take 1 tablet and then change weekly dose to take 1 1/2 tablet daily except take 1 tablets on Wednesdays and recheck on 1/2.  Contacted pt by phone and advised of dosing and recheck. Advised if any s/s of abnormal bruising or bleeding to go to the ER. Pt verbalized understanding.

## 2023-02-10 ENCOUNTER — Telehealth: Payer: Self-pay

## 2023-02-10 LAB — POCT INR: INR: 1.1 — AB (ref 2.0–3.0)

## 2023-02-10 NOTE — Telephone Encounter (Signed)
 Pt tests INR at home. Time for pt to test again and she did not test on date she was advised to from last testing. LVM requesting pt test INR.

## 2023-02-11 ENCOUNTER — Ambulatory Visit (INDEPENDENT_AMBULATORY_CARE_PROVIDER_SITE_OTHER): Payer: Managed Care, Other (non HMO)

## 2023-02-11 DIAGNOSIS — Z7901 Long term (current) use of anticoagulants: Secondary | ICD-10-CM | POA: Diagnosis not present

## 2023-02-11 NOTE — Progress Notes (Signed)
 Pt is testing INR at home. Received result on [employer] website. Pt reports she reversed her dosing by accident and instead of taking 1 1/2 tablets daily except take 1 tablet on Wednesday, she has been taking 1 tablet daily and 1 1/2 tablet only on Wednesday. This has decreased her weekly dose from 75 mg to 56.25 mg. Pt became aware of the mistake when she tested her INR last night and the result was 1.1. Pt increased her dose of warfarin last night to 2 tablets. Increase dose today to take 2 1/2 tablets and then change weekly dose to take 1 1/2 tablet daily except take 1 tablets on Wednesdays and recheck on 1/15.  Contacted pt by phone and advised of dosing and recheck. Advised if any s/s of clots to go to the ER. Pt verbalized understanding.

## 2023-02-11 NOTE — Patient Instructions (Addendum)
 Pre visit review using our clinic review tool, if applicable. No additional management support is needed unless otherwise documented below in the visit note.  Increase dose today to take 2 1/2 tablets and then change weekly dose to take 1 1/2 tablet daily except take 1 tablets on Wednesdays and recheck on 1/15.

## 2023-02-11 NOTE — Progress Notes (Signed)
 I have reviewed and agree with note, evaluation, plan.   Tana Conch, MD

## 2023-02-19 ENCOUNTER — Telehealth: Payer: Self-pay

## 2023-02-19 NOTE — Telephone Encounter (Signed)
Pt due to check INR. LVM

## 2023-02-20 LAB — POCT INR: INR: 1.9 — AB (ref 2.0–3.0)

## 2023-02-20 NOTE — Telephone Encounter (Signed)
LVM to check INR

## 2023-02-23 ENCOUNTER — Ambulatory Visit (INDEPENDENT_AMBULATORY_CARE_PROVIDER_SITE_OTHER): Payer: Managed Care, Other (non HMO)

## 2023-02-23 DIAGNOSIS — Z7901 Long term (current) use of anticoagulants: Secondary | ICD-10-CM

## 2023-02-23 NOTE — Patient Instructions (Addendum)
Pre visit review using our clinic review tool, if applicable. No additional management support is needed unless otherwise documented below in the visit note.  Increase dose today to take 2 tablets and then change weekly dose to take 1 1/2 tablet daily except take 2 tablets on Mondays and recheck on 1/27.

## 2023-02-23 NOTE — Progress Notes (Signed)
Pt is testing INR at home. Received result on [employer] website. Pt reported after hours on Friday, 1/17. Results received this morning.  Increase dose today to take 2 tablets and then change weekly dose to take 1 1/2 tablet daily except take 2 tablets on Mondays and recheck on 1/27.  LVM with dosing instructions and requested to call back to verify if she changed any dosing over the weekend due to subtherapeutic INR.

## 2023-03-03 ENCOUNTER — Telehealth: Payer: Self-pay

## 2023-03-03 LAB — POCT INR: INR: 4.2 — AB (ref 2.0–3.0)

## 2023-03-03 NOTE — Telephone Encounter (Signed)
Time for pt to test INR at home. LVM for pt to test.

## 2023-03-04 ENCOUNTER — Ambulatory Visit (INDEPENDENT_AMBULATORY_CARE_PROVIDER_SITE_OTHER): Payer: Self-pay

## 2023-03-04 DIAGNOSIS — Z7901 Long term (current) use of anticoagulants: Secondary | ICD-10-CM | POA: Diagnosis not present

## 2023-03-04 NOTE — Progress Notes (Signed)
I have reviewed and agree with note, evaluation, plan. Also forwarding to Dr. Milinda Antis so she can have her team monitor to make sure patient receives message on reduced dose/held dose  Tana Conch, MD

## 2023-03-04 NOTE — Progress Notes (Signed)
Pt is testing INR at home. Received result on [employer] website this morning and pt tested after hours last night. Hold dose today and then change weekly dose to take 1 1/2 tablet daily and recheck in 2 weeks, on 2/11.  LVM with dosing instructions and requested to call back to verify if she changed any dosing over the weekend due to subtherapeutic INR.

## 2023-03-04 NOTE — Patient Instructions (Addendum)
Pre visit review using our clinic review tool, if applicable. No additional management support is needed unless otherwise documented below in the visit note.  Hold dose today and then change weekly dose to take 1 1/2 tablet daily and recheck in 2 weeks, on 2/11.

## 2023-03-18 ENCOUNTER — Telehealth: Payer: Self-pay

## 2023-03-18 NOTE — Telephone Encounter (Signed)
Pt tests INR at home. Time for pt to test. LVM

## 2023-03-19 NOTE — Telephone Encounter (Signed)
Pt returned call and LVM that said she was out of testing supplies and would like to make apt for coumadin clinic to test INR.   LVM

## 2023-03-20 ENCOUNTER — Ambulatory Visit: Payer: Managed Care, Other (non HMO)

## 2023-03-26 ENCOUNTER — Ambulatory Visit (INDEPENDENT_AMBULATORY_CARE_PROVIDER_SITE_OTHER): Payer: Managed Care, Other (non HMO)

## 2023-03-26 DIAGNOSIS — Z7901 Long term (current) use of anticoagulants: Secondary | ICD-10-CM | POA: Diagnosis not present

## 2023-03-26 LAB — POCT INR: INR: 2.2 (ref 2.0–3.0)

## 2023-03-26 NOTE — Patient Instructions (Addendum)
Pre visit review using our clinic review tool, if applicable. No additional management support is needed unless otherwise documented below in the visit note.  Increase dose today to take 2 tablets and then continue 1 1/2 tablet daily and recheck in 2 weeks, on 3/4.

## 2023-03-26 NOTE — Telephone Encounter (Signed)
Contacted pt with dosing instructions. Documentation in anticoagulation encounter. Cancelled apt for tomorrow.

## 2023-03-26 NOTE — Progress Notes (Signed)
Pt is testing INR at home. Received result on [employer] website today. Increase dose today to take 2 tablets and then continue 1 1/2 tablet daily and recheck in 2 weeks, on 3/4. Contacted pt and advised of dosing. Pt denies any changes. Pt verbalized understanding.

## 2023-03-27 ENCOUNTER — Ambulatory Visit: Payer: Managed Care, Other (non HMO)

## 2023-04-05 ENCOUNTER — Other Ambulatory Visit: Payer: Self-pay | Admitting: Family Medicine

## 2023-04-05 ENCOUNTER — Other Ambulatory Visit: Payer: Self-pay | Admitting: Internal Medicine

## 2023-04-07 ENCOUNTER — Ambulatory Visit (INDEPENDENT_AMBULATORY_CARE_PROVIDER_SITE_OTHER): Payer: Self-pay

## 2023-04-07 DIAGNOSIS — Z7901 Long term (current) use of anticoagulants: Secondary | ICD-10-CM | POA: Diagnosis not present

## 2023-04-07 LAB — POCT INR: INR: 1.5 — AB (ref 2.0–3.0)

## 2023-04-07 NOTE — Telephone Encounter (Signed)
 Pt has been contacted by phone and documentation is in today's anticoagulation encounter.

## 2023-04-07 NOTE — Progress Notes (Addendum)
 Pt is testing INR at home. Received result on [employer] website today. Review of pt's chart revealed INR results have been very labile since pt started testing at home at the end of October, 2024. Pt has had out of range INR result occasionally before home testing but mostly was stable and in range before then.  Pt reports she did have some random vomiting in the evening one night this week. She does have a hx of this random vomiting. She is wondering if some of the warfarin was vomited up.  She denies any change in manufacturer of her warfarin. Increase dose today to take 2 tablets and then change weekly dose to take 1 1/2 tablet daily except take 2 tablets on Monday and Friday and recheck in 1 weeks, on 3/14 at [facility]. Contacted pt and advised of dosing. Advised pt it would be best for her to come to the coumadin clinic with her home testing supplies so we could check her machine and technique and assure everything is ok with the machine. Pt will bring machine to apt on 3/14. Advised if any s/s of a blood clot or bleeding to go to ER.  Pt verbalized understanding.  Medical screening examination/treatment/procedure(s) were performed by non-physician practitioner and as supervising physician I was immediately available for consultation/collaboration.  I agree with above. Jacinta Shoe, MD

## 2023-04-07 NOTE — Patient Instructions (Addendum)
 Pre visit review using our clinic review tool, if applicable. No additional management support is needed unless otherwise documented below in the visit note.  Increase dose today to take 2 tablets and then change weekly dose to take 1 1/2 tablet daily except take 2 tablets on Monday and Friday and recheck in 1 weeks, on 3/14 at [facility].

## 2023-04-17 ENCOUNTER — Ambulatory Visit (INDEPENDENT_AMBULATORY_CARE_PROVIDER_SITE_OTHER): Payer: Self-pay

## 2023-04-17 ENCOUNTER — Ambulatory Visit

## 2023-04-17 DIAGNOSIS — Z7901 Long term (current) use of anticoagulants: Secondary | ICD-10-CM | POA: Diagnosis not present

## 2023-04-17 LAB — POCT INR: INR: 3.1 — AB (ref 2.0–3.0)

## 2023-04-17 NOTE — Progress Notes (Signed)
 Continue 1 1/2 tablet daily except take 2 tablets on Monday and Friday and recheck in 2 weeks, on 3/25. Contacted pt by phone and provided dosing and recheck date.  Pt verbalized understanding.

## 2023-04-17 NOTE — Telephone Encounter (Signed)
 Noted. Pt was contacted with dosing instructions.

## 2023-04-17 NOTE — Patient Instructions (Addendum)
 Pre visit review using our clinic review tool, if applicable. No additional management support is needed unless otherwise documented below in the visit note.  Continue 1 1/2 tablet daily except take 2 tablets on Monday and Friday and recheck in 2 weeks, on 3/25.

## 2023-04-18 ENCOUNTER — Ambulatory Visit (INDEPENDENT_AMBULATORY_CARE_PROVIDER_SITE_OTHER)

## 2023-04-18 ENCOUNTER — Ambulatory Visit
Admission: RE | Admit: 2023-04-18 | Discharge: 2023-04-18 | Disposition: A | Discharge status: Home / Self Care | Source: Ambulatory Visit | Attending: Internal Medicine | Admitting: Internal Medicine

## 2023-04-18 VITALS — BP 144/89 | HR 88 | Temp 98.1°F | Resp 20 | Ht 65.0 in | Wt 365.0 lb

## 2023-04-18 DIAGNOSIS — J22 Unspecified acute lower respiratory infection: Secondary | ICD-10-CM

## 2023-04-18 DIAGNOSIS — R051 Acute cough: Secondary | ICD-10-CM

## 2023-04-18 MED ORDER — BENZONATATE 100 MG PO CAPS: 100.0000 mg | ORAL_CAPSULE | Freq: Three times a day (TID) | ORAL | 0 refills | Status: DC

## 2023-04-18 MED ORDER — AZITHROMYCIN 250 MG PO TABS: ORAL_TABLET | ORAL | 0 refills | Status: DC

## 2023-04-18 NOTE — ED Provider Notes (Signed)
 [facility]    CSN: [id number] Arrival date & time: 04/18/23  1406      History   Chief Complaint Chief Complaint  Patient presents with   Cough    HPI TENITA CUE is a 58 y.o. female.   59 year old female who presents urgent care with complaints of persistent cough and mild wheezing.  This started on Saturday last week when she was going to visit a family member.  She started feeling weakness in generally unwell.  She then developed a cough with phlegm production.  She also had fevers and chills at that time.  Those symptoms has resolved but she continues to have a wheezing and cough.  She says the wheezing feels like it is almost in the throat area.  She also reports that when she starts coughing it is very difficult to stop and that it feels like it is total body.  She denies any shortness of breath or chest pain.  She does have a history of an empyema on the left lower lobe.     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    Loculated-chronic on Left-VanTright; s/p VATS 5/10   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   GERD (gastroesophageal reflux disease)    on prilosec r/t gastric sleeve surgery   HLD (hyperlipidemia)    Iron deficiency anemia    IV dextran [place]on[place]   Nephritis and nephropathy, not specified as acute or chronic, with unspecified pathological lesion in kidney    Nonspecific abnormal results of liver function study    Obesity, unspecified    Other specified acquired hypothyroidism    Panic disorder without agoraphobia    Personal history of venous  thrombosis and embolism 2002   during pregnancy; ?factor 5 leiden (sees heme)   Pleural effusion 2005   c/w lupus initial w/u; recurrent Right as pred tapered off July 2011   Sleep apnea    Systemic lupus erythematosus (HCC)    renal GN, hx of pericardial eff in late 90's   Unspecified essential hypertension     Patient Active Problem List   Diagnosis Date Noted   Encounter for hepatitis C screening test for low risk patient 01/15/2023   Globus sensation 10/23/2021   IDA (iron deficiency anemia) 06/28/2020   Iron deficiency anemia 06/18/2020   History of atrial fibrillation 06/18/2020   S/P aortic valve replacement with bioprosthetic valve 04/06/2020   Coronary artery disease 04/06/2020   Chest pain of uncertain etiology 01/17/2020   Esophageal diverticulum    Esophageal dysphagia    Fatigue 05/20/2019   Systemic lupus erythematosus with lung involvement (HCC) 01/04/2019   Aortic stenosis 08/15/2017   Vitamin B12 deficiency 11/15/2015   Allergic rhinitis 07/01/2015   History of cerebral hemorrhage 07/01/2015   Chronic daily headache 06/29/2015   Encounter for therapeutic drug monitoring 03/25/2013   CKD (chronic kidney disease) stage 2, GFR 60-89 ml/min 12/20/2012   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   Routine general medical examination at a  health care facility 12/07/2012   History of HPV infection 12/07/2012   Long term (current) use of anticoagulants 10/06/2012   Chronic anticoagulation 04/22/2012   S/P bariatric surgery 04/22/2012   Homozygous Factor V Leiden mutation (HCC) 04/22/2012   OSA on CPAP 11/20/2011   Prediabetes 09/16/2010   EDEMA 01/08/2010   Enlarged lymph nodes 08/13/2009   History of pulmonary embolism 05/24/2009   Morbid obesity (HCC) 12/08/2007   Hypothyroidism 06/09/2006   HYPERCHOLESTEROLEMIA 06/09/2006   Generalized anxiety disorder 06/09/2006   PANIC DISORDER 06/09/2006   Essential hypertension 06/09/2006   Nephritis and nephropathy,  with pathological lesion in kidney 06/09/2006   Systemic lupus erythematosus (HCC) 06/09/2006   DVT, HX OF 06/09/2006    Past Surgical History:  Procedure Laterality Date   AORTIC VALVE REPLACEMENT N/A 04/06/2020   Procedure: AORTIC VALVE REPLACEMENT (AVR) USING INSPIRIS 21 MM AORTIC VALVE;  Surgeon: Alleen Borne, MD;  Location: [facility] OR;  Service: Open Heart Surgery;  Laterality: N/A;   BIOPSY  04/12/2019   Procedure: BIOPSY;  Surgeon: Tressia Danas, MD;  Location: [facility];  Service: Gastroenterology;;   CARDIAC CATHETERIZATION     ESOPHAGEAL MANOMETRY N/A 07/15/2019   Procedure: ESOPHAGEAL MANOMETRY (EM);  Surgeon: Napoleon Form, MD;  Location: [facility];  Service: Endoscopy;  Laterality: N/A;   ESOPHAGOGASTRODUODENOSCOPY (EGD) WITH PROPOFOL N/A 04/12/2019   Procedure: ESOPHAGOGASTRODUODENOSCOPY (EGD) WITH PROPOFOL;  Surgeon: Tressia Danas, MD;  Location: [facility];  Service: Gastroenterology;  Laterality: N/A;   gastric sleeve  09/04/2010   bariatric surgery Dr Adolphus Birchwood    LAPAROSCOPIC TUBAL LIGATION  12/09/2010   Procedure: LAPAROSCOPIC TUBAL LIGATION;  Surgeon: Juluis Mire;  Location: [facility];  Service: Gynecology;  Laterality: Bilateral;  Attempted see Nursing note   pleurocentesis  10/04/2004   Pleuryx catheter placement  10/04/2009   VanTright----removed 9/11   RENAL BIOPSY  09/24/2012   RIGHT/LEFT HEART CATH AND CORONARY ANGIOGRAPHY N/A 01/19/2020   Procedure: RIGHT/LEFT HEART CATH AND CORONARY ANGIOGRAPHY;  Surgeon: Runell Gess, MD;  Location: [facility];  Service: Cardiovascular;  Laterality: N/A;   svd     x 3   TEE WITHOUT CARDIOVERSION N/A 04/06/2020   Procedure: TRANSESOPHAGEAL ECHOCARDIOGRAM (TEE);  Surgeon: Alleen Borne, MD;  Location: [facility] OR;  Service: Open Heart Surgery;  Laterality: N/A;   THORACENTESIS  02/04/2008   with penumonia   WISDOM TOOTH EXTRACTION      OB History   No obstetric history on file.      Home  Medications    Prior to Admission medications   Medication Sig Start Date End Date Taking? Authorizing Provider  aspirin EC 81 MG EC tablet Take 1 tablet (81 mg total) by mouth daily. Swallow whole. 04/11/20  Yes Doree Fudge M, PA-C  furosemide (LASIX) 20 MG tablet Take 20 mg by mouth daily. 04/02/20  Yes [provider]  hydroxychloroquine (PLAQUENIL) 200 MG tablet Take 200 mg by mouth 2 (two) times daily.   Yes [provider]  levothyroxine (SYNTHROID) 175 MCG tablet TAKE 1 TABLET BY MOUTH DAILY BEFORE BREAKFAST. 04/06/23  Yes Tower, Marne A, MD  warfarin (COUMADIN) 7.5 MG tablet TAKE 1.5 TABLETS (11.25MG ) BY MOUTH DAILY OR AS DIRECTED BY ANTICOAGULATION CLINIC Patient taking differently: TAKE 1.5 TABLETS (11.25MG ) BY MOUTH DAILY OR AS DIRECTED BY ANTICOAGULATION CLINIC. Last INR check: 3.1 10/17/22  Yes Tower, Audrie Gallus, MD  albuterol (VENTOLIN HFA) 108 (90 Base) MCG/ACT inhaler Inhale 1-2 puffs into the lungs  every 6 (six) hours as needed for wheezing or shortness of breath. 08/26/21   Jeannie Fend, PA-C  amoxicillin (AMOXIL) 500 MG tablet TAKE 4 TABS (2000 MG) 30-60 MIN BEFORE DENTAL PROCEDURE. 12/03/22   Pricilla Riffle, MD  bismuth subsalicylate (PEPTO BISMOL) 262 MG/15ML suspension Take 30 mLs by mouth every 6 (six) hours as needed for indigestion or diarrhea or loose stools.    [provider]  calcium carbonate (TUMS - DOSED IN MG ELEMENTAL CALCIUM) 500 MG chewable tablet Chew 1-2 tablets by mouth 3 (three) times daily as needed for indigestion or heartburn.    [provider]  cyanocobalamin (,VITAMIN B-12,) 1000 MCG/ML injection Inject 1,000 mcg into the muscle every 30 (thirty) days.  10/06/19   [provider]  cyanocobalamin (VITAMIN B12) 1000 MCG/ML injection Inject 1 mL (1,000 mcg total) into the muscle every 6 (six) weeks. 01/15/23 07/14/23  Tower, Audrie Gallus, MD  cyclobenzaprine (FLEXERIL) 10 MG tablet Take 1 tablet (10 mg total) by mouth 3  (three) times daily as needed. 12/18/22   Waldon Merl, PA-C  ezetimibe (ZETIA) 10 MG tablet TAKE 1 TABLET BY MOUTH EVERY DAY 04/06/23   Tower, Audrie Gallus, MD  metoprolol tartrate (LOPRESSOR) 50 MG tablet TAKE 1 TABLET BY MOUTH TWICE A DAY 04/07/23   Pricilla Riffle, MD  Multiple Vitamins-Minerals (BARIATRIC MULTIVITAMINS/IRON) CAPS Take 1 tablet by mouth daily.    [provider]  NEEDLE, DISP, 25 G 25G X 1" MISC 1 Needle by Does not apply route every 30 (thirty) days. USE TO INJECTION B12 11/27/22 05/26/23  Tower, Audrie Gallus, MD  omeprazole (PRILOSEC) 40 MG capsule Take 1 capsule (40 mg total) by mouth daily. 01/15/23   Tower, Audrie Gallus, MD  PARoxetine (PAXIL) 40 MG tablet TAKE 1 TABLET BY MOUTH EVERY DAY IN THE MORNING 04/06/23   Tower, Audrie Gallus, MD  rosuvastatin (CRESTOR) 40 MG tablet TAKE 1 TABLET BY MOUTH EVERY DAY 09/26/22   Pricilla Riffle, MD  Syringe/Needle, Disp, (SYRINGE 3CC/25GX5/8") 25G X 5/8" 3 ML MISC 1 Syringe by Does not apply route every 30 (thirty) days. USE TO DRAW UP B12 11/27/22 05/26/23  Tower, Audrie Gallus, MD    Family History Family History  Problem Relation Age of Onset   Lupus Sister    Hypertension Father    Hyperlipidemia Father    Leukemia Sister    Diabetes Other        GM   Colon cancer Neg Hx    Esophageal cancer Neg Hx    Pancreatic cancer Neg Hx     Social History Social History   Tobacco Use   Smoking status: Former    Current packs/day: 0.00    Average packs/day: 0.5 packs/day for 10.0 years (5.0 ttl pk-yrs)    Types: Cigarettes    Start date: 05/05/1999    Quit date: 05/04/2009    Years since quitting: 13.9   Smokeless tobacco: Never   Tobacco comments:    Socially x10 years (1 pack/month)  Vaping Use   Vaping status: Never Used  Substance Use Topics   Alcohol use: Yes    Comment: rare   Drug use: No     Allergies   Lisinopril, Lovenox [enoxaparin sodium], Moxifloxacin, and Lovenox [enoxaparin]   Review of Systems Review of Systems   Constitutional:  Negative for chills and fever.  HENT:  Negative for ear pain and sore throat.   Eyes:  Negative for pain and visual disturbance.  Respiratory:  Positive for cough and wheezing. Negative for shortness of breath.   Cardiovascular:  Negative for chest pain and palpitations.  Gastrointestinal:  Negative for abdominal pain and vomiting.  Genitourinary:  Negative for dysuria and hematuria.  Musculoskeletal:  Negative for arthralgias and back pain.  Skin:  Negative for color change and rash.  Neurological:  Negative for seizures and syncope.  All other systems reviewed and are negative.    Physical Exam Triage Vital Signs ED Triage Vitals  Encounter Vitals Group     BP 04/18/23 1424 (!) 144/89     Systolic BP Percentile --      Diastolic BP Percentile --      Pulse Rate 04/18/23 1424 88     Resp 04/18/23 1424 (!) 22     Temp 04/18/23 1424 98.1 F (36.7 C)     Temp Source 04/18/23 1424 Oral     SpO2 04/18/23 1424 95 %     Weight 04/18/23 1420 (!) 365 lb (165.6 kg)     Height 04/18/23 1420 5\' 5"  (1.651 m)     Head Circumference --      Peak Flow --      Pain Score 04/18/23 1418 0     Pain Loc --      Pain Education --      Exclude from Growth Chart --    No data found.  Updated Vital Signs BP (!) 144/89 (BP Location: Right Arm)   Pulse 88   Temp 98.1 F (36.7 C) (Oral)   Resp 20   Ht 5\' 5"  (1.651 m)   Wt (!) 365 lb (165.6 kg)   LMP 11/08/2010   SpO2 96%   BMI 60.74 kg/m   Visual Acuity Right Eye Distance:   Left Eye Distance:   Bilateral Distance:    Right Eye Near:   Left Eye Near:    Bilateral Near:     Physical Exam Vitals and nursing note reviewed.  Constitutional:      General: She is not in acute distress.    Appearance: She is well-developed.  HENT:     Head: Normocephalic and atraumatic.  Eyes:     Conjunctiva/sclera: Conjunctivae normal.  Cardiovascular:     Rate and Rhythm: Normal rate and regular rhythm.     Heart sounds: No  murmur heard. Pulmonary:     Effort: Pulmonary effort is normal. No respiratory distress.     Breath sounds: Examination of the left-upper field reveals wheezing. Examination of the right-lower field reveals rhonchi. Examination of the left-lower field reveals rhonchi. Decreased breath sounds, wheezing and rhonchi present.  Abdominal:     Palpations: Abdomen is soft.     Tenderness: There is no abdominal tenderness.  Musculoskeletal:        General: No swelling.     Cervical back: Neck supple.  Skin:    General: Skin is warm and dry.     Capillary Refill: Capillary refill takes less than 2 seconds.  Neurological:     Mental Status: She is alert.  Psychiatric:        Mood and Affect: Mood normal.      UC Treatments / Results  Labs (all labs ordered are listed, but only abnormal results are displayed) Labs Reviewed - No data to display  EKG   Radiology No results found.  Procedures Procedures (including critical care time)  Medications Ordered in UC Medications - No data to display  Initial Impression / Assessment and  Plan / UC Course  I have reviewed the triage vital signs and the nursing notes.  Pertinent labs & imaging results that were available during my care of the patient were reviewed by me and considered in my medical decision making (see chart for details).     Acute cough - Plan: DG Chest 2 View, DG Chest 2 View  Lower respiratory infection   Chest x-ray done today.  Final results from the radiologist are still pending but on brief evaluation there is a hazy appearance to the right lower lobe concerning for possible pneumonia.  There is the empyema on the left.  This appears stable from previous x-rays.  We will treat with the following given the x-ray findings, the severity of the symptoms and the persistence of the symptoms: Azithromycin 250mg  Take 2 tablets today and the 1 tablet daily for 4 more days.  Notify the clinic that manages your Coumadin that  you will be on antibiotics. Use your Albuterol inhaler 1-2 puffs every 6 hours as needed for wheezing/shortness of breath. Benzonatate (tessalon) 100 mg every 8 hours as needed for cough.  Rest and stay hydrated.   Return to urgent care or PCP if symptoms worsen or fail to resolve.    Final Clinical Impressions(s) / UC Diagnoses   Final diagnoses:  Acute cough   Discharge Instructions   None    ED Prescriptions   None    PDMP not reviewed this encounter.   Landis Martins, [place] 04/18/23 1531

## 2023-04-18 NOTE — Discharge Instructions (Addendum)
 Chest x-ray done today.  Final results from the radiologist are still pending but on brief evaluation there is a hazy appearance to the right lower lobe concerning for possible pneumonia.  There is the empyema on the left.  This appears stable from previous x-rays.  We will treat with the following given the x-ray findings, the severity of the symptoms and the persistence of the symptoms: Azithromycin 250mg  Take 2 tablets today and the 1 tablet daily for 4 more days.  Notify the clinic that manages your Coumadin that you will be on antibiotics. Use your Albuterol inhaler 1-2 puffs every 6 hours as needed for wheezing/shortness of breath. Benzonatate (tessalon) 100 mg every 8 hours as needed for cough.  Rest and stay hydrated.   Return to urgent care or PCP if symptoms worsen or fail to resolve.

## 2023-04-18 NOTE — ED Triage Notes (Signed)
 Cough with phlem over a week. Had a fever and chill last Saturday. - Entered by patient  Last known Fever "none since last Sunday". "I am just concerned with this productive cough". No asthma or COPD.

## 2023-04-20 ENCOUNTER — Encounter: Payer: Self-pay | Admitting: Family Medicine

## 2023-04-29 ENCOUNTER — Telehealth: Payer: Self-pay

## 2023-04-29 NOTE — Telephone Encounter (Signed)
LVM for pt to test INR ?

## 2023-04-30 ENCOUNTER — Ambulatory Visit (INDEPENDENT_AMBULATORY_CARE_PROVIDER_SITE_OTHER): Payer: Self-pay

## 2023-04-30 DIAGNOSIS — Z7901 Long term (current) use of anticoagulants: Secondary | ICD-10-CM

## 2023-04-30 LAB — POCT INR: INR: 2.5 (ref 2.0–3.0)

## 2023-04-30 NOTE — Patient Instructions (Addendum)
 Pre visit review using our clinic review tool, if applicable. No additional management support is needed unless otherwise documented below in the visit note.  Continue 1 1/2 tablet daily except take 2 tablets on Monday and Friday and recheck in 2 weeks, on 4/9.

## 2023-04-30 NOTE — Progress Notes (Addendum)
 Pt was in UC on 3/15 for acute cough and prescribed a z-pack and tessalon pearles. Abx does have an interaction with warfarin. Coumadin clinic was not aware of this until today. Pt was advised by provider at [facility] to contact coumadin clinic but she did not make contact. Continue 1 1/2 tablet daily except take 2 tablets on Monday and Friday and recheck in 2 weeks, on 4/9. Contacted pt by phone and provided dosing and recheck date.  Pt verbalized understanding.

## 2023-05-03 ENCOUNTER — Other Ambulatory Visit: Payer: Self-pay | Admitting: Family Medicine

## 2023-05-03 DIAGNOSIS — Z7901 Long term (current) use of anticoagulants: Secondary | ICD-10-CM

## 2023-05-04 NOTE — Telephone Encounter (Signed)
 Pt is compliant with warfarin management and PCP apts.  Sent in refill of warfarin to requested pharmacy.

## 2023-05-14 ENCOUNTER — Telehealth: Payer: Self-pay

## 2023-05-14 NOTE — Telephone Encounter (Signed)
 Time for pt to test INR. Pt tests at home. LVM reminding pt it is time to test.

## 2023-05-15 NOTE — Telephone Encounter (Signed)
 LVM coumadin clinic nurse will be out of the office the rest of the afternoon and to test on Monday, 4/14.

## 2023-05-18 LAB — POCT INR: INR: 2.8 (ref 2.0–3.0)

## 2023-05-19 ENCOUNTER — Ambulatory Visit (INDEPENDENT_AMBULATORY_CARE_PROVIDER_SITE_OTHER): Payer: Self-pay

## 2023-05-19 DIAGNOSIS — Z7901 Long term (current) use of anticoagulants: Secondary | ICD-10-CM

## 2023-05-19 NOTE — Patient Instructions (Addendum)
 Pre visit review using our clinic review tool, if applicable. No additional management support is needed unless otherwise documented below in the visit note.  Continue 1 1/2 tablet daily except take 2 tablets on Monday and Friday and recheck in 2 weeks, on 4/28.

## 2023-05-19 NOTE — Progress Notes (Signed)
 Pt tests at home and places result on [employer]. Pt tested last night and result was received this morning. Continue 1 1/2 tablet daily except take 2 tablets on Monday and Friday and recheck in 2 weeks, on 4/28. LVM with instructions and recheck date.

## 2023-05-23 ENCOUNTER — Telehealth: Admitting: Family Medicine

## 2023-05-23 DIAGNOSIS — J069 Acute upper respiratory infection, unspecified: Secondary | ICD-10-CM

## 2023-05-23 MED ORDER — BENZONATATE 100 MG PO CAPS: 200.0000 mg | ORAL_CAPSULE | Freq: Three times a day (TID) | ORAL | 0 refills | Status: AC | PRN

## 2023-05-23 MED ORDER — ALBUTEROL SULFATE HFA 108 (90 BASE) MCG/ACT IN AERS: 1.0000 | INHALATION_SPRAY | Freq: Four times a day (QID) | RESPIRATORY_TRACT | 0 refills | Status: DC | PRN

## 2023-05-23 MED ORDER — GUAIFENESIN 200 MG PO TABS: 400.0000 mg | ORAL_TABLET | ORAL | 0 refills | Status: AC | PRN

## 2023-05-23 NOTE — Progress Notes (Signed)
 Virtual Visit Consent   [name], you are scheduled for a virtual visit with a [facility] provider today. Just as with appointments in the office, your consent must be obtained to participate. Your consent will be active for this visit and any virtual visit you may have with one of our providers in the next 365 days. If you have a MyChart account, a copy of this consent can be sent to you electronically.  As this is a virtual visit, video technology does not allow for your provider to perform a traditional examination. This may limit your provider's ability to fully assess your condition. If your provider identifies any concerns that need to be evaluated in person or the need to arrange testing (such as labs, EKG, etc.), we will make arrangements to do so. Although advances in technology are sophisticated, we cannot ensure that it will always work on either your end or our end. If the connection with a video visit is poor, the visit may have to be switched to a telephone visit. With either a video or telephone visit, we are not always able to ensure that we have a secure connection.  By engaging in this virtual visit, you consent to the provision of healthcare and authorize for your insurance to be billed (if applicable) for the services provided during this visit. Depending on your insurance coverage, you may receive a charge related to this service.  I need to obtain your verbal consent now. Are you willing to proceed with your visit today? [name] has provided verbal consent on 05/23/2023 for a virtual visit (video or telephone). Louvenia Roys, [place]  Date: 05/23/2023 4:00 PM   Virtual Visit via Video Note   I, Louvenia Roys, connected with  [name]  ([id number], 07-23-1965) on 05/23/23 at  3:45 PM EDT by a video-enabled telemedicine application and verified that I am speaking with the correct person using two identifiers.  Location: Patient: Virtual Visit Location Patient:  Home Provider: Virtual Visit Location Provider: Home Office   I discussed the limitations of evaluation and management by telemedicine and the availability of in person appointments. The patient expressed understanding and agreed to proceed.    History of Present Illness: [name] is a 58 y.o. who identifies as a female who was assigned female at birth, and is being seen today for c/o cough and congestion.  Pt states feeling very tired yesterday and had a low grade fever.  Pt states symptoms started Wednesday.  Pt states she has a valve replacement and states she needs to be cautious about infections. Pt states taking flonase, zyrtec and Tylenol  for pain. Pt requests refill on inhaler.   HPI: HPI  Problems:  Patient Active Problem List   Diagnosis Date Noted   Encounter for hepatitis C screening test for low risk patient 01/15/2023   Globus sensation 10/23/2021   History of bariatric surgery 01/08/2021   IDA (iron  deficiency anemia) 06/28/2020   Iron  deficiency anemia 06/18/2020   History of atrial fibrillation 06/18/2020   S/P aortic valve replacement with bioprosthetic valve 04/06/2020   Coronary artery disease 04/06/2020   Chest pain of uncertain etiology 01/17/2020   Esophageal diverticulum    Esophageal dysphagia    Fatigue 05/20/2019   Systemic lupus erythematosus with lung involvement (HCC) 01/04/2019   Aortic stenosis 08/15/2017   Vitamin B12 deficiency 11/15/2015   Nausea & vomiting 10/15/2015   Allergic rhinitis 07/01/2015   History of cerebral hemorrhage 07/01/2015  Chronic daily headache 06/29/2015   Encounter for therapeutic drug monitoring 03/25/2013   CKD (chronic kidney disease) stage 2, GFR 60-89 ml/min 12/20/2012   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   Routine general medical examination at a health care facility 12/07/2012   History of HPV infection 12/07/2012   Long term (current) use of anticoagulants 10/06/2012   Chronic anticoagulation  04/22/2012   S/P bariatric surgery 04/22/2012   Homozygous Factor V Leiden mutation (HCC) 04/22/2012   OSA on CPAP 11/20/2011   Prediabetes 09/16/2010   EDEMA 01/08/2010   Pleural effusion 08/27/2009   Enlarged lymph nodes 08/13/2009   History of pulmonary embolism 05/24/2009   Morbid obesity (HCC) 12/08/2007   Hypothyroidism 06/09/2006   HYPERCHOLESTEROLEMIA 06/09/2006   Generalized anxiety disorder 06/09/2006   PANIC DISORDER 06/09/2006   Essential hypertension 06/09/2006   Nephritis and nephropathy, with pathological lesion in kidney 06/09/2006   Systemic lupus erythematosus (HCC) 06/09/2006   DVT, HX OF 06/09/2006    Allergies:  Allergies  Allergen Reactions   Lisinopril  Cough   Lovenox  [Enoxaparin  Sodium] Itching   Moxifloxacin Other (See Comments)    REACTION: hallucinations   Lovenox  [Enoxaparin ]     Itching    Medications:  Current Outpatient Medications:    benzonatate  (TESSALON ) 100 MG capsule, Take 2 capsules (200 mg total) by mouth 3 (three) times daily as needed for up to 7 days., Disp: 21 capsule, Rfl: 0   guaiFENesin  200 MG tablet, Take 2 tablets (400 mg total) by mouth every 4 (four) hours as needed for up to 7 days for cough or to loosen phlegm., Disp: 30 tablet, Rfl: 0   albuterol  (VENTOLIN  HFA) 108 (90 Base) MCG/ACT inhaler, Inhale 1-2 puffs into the lungs every 6 (six) hours as needed for wheezing or shortness of breath., Disp: 8.5 g, Rfl: 0   amoxicillin  (AMOXIL ) 500 MG tablet, TAKE 4 TABS (2000 MG) 30-60 MIN BEFORE DENTAL PROCEDURE., Disp: 4 tablet, Rfl: 2   aspirin  EC 81 MG EC tablet, Take 1 tablet (81 mg total) by mouth daily. Swallow whole., Disp: 30 tablet, Rfl: 11   azithromycin  (ZITHROMAX ) 250 MG tablet, Take first 2 tablets together, then 1 every day until finished., Disp: 6 tablet, Rfl: 0   bismuth  subsalicylate (PEPTO BISMOL) 262 MG/15ML suspension, Take 30 mLs by mouth every 6 (six) hours as needed for indigestion or diarrhea or loose stools.,  Disp: , Rfl:    calcium  carbonate (TUMS - DOSED IN MG ELEMENTAL CALCIUM ) 500 MG chewable tablet, Chew 1-2 tablets by mouth 3 (three) times daily as needed for indigestion or heartburn., Disp: , Rfl:    cyanocobalamin  (,VITAMIN B-12,) 1000 MCG/ML injection, Inject 1,000 mcg into the muscle every 30 (thirty) days. , Disp: , Rfl:    cyanocobalamin  (VITAMIN B12) 1000 MCG/ML injection, Inject 1 mL (1,000 mcg total) into the muscle every 6 (six) weeks., Disp: , Rfl:    cyclobenzaprine  (FLEXERIL ) 10 MG tablet, Take 1 tablet (10 mg total) by mouth 3 (three) times daily as needed., Disp: 15 tablet, Rfl: 0   ezetimibe  (ZETIA ) 10 MG tablet, TAKE 1 TABLET BY MOUTH EVERY DAY, Disp: 90 tablet, Rfl: 0   furosemide  (LASIX ) 20 MG tablet, Take 20 mg by mouth daily., Disp: , Rfl:    hydroxychloroquine  (PLAQUENIL ) 200 MG tablet, Take 200 mg by mouth 2 (two) times daily., Disp: , Rfl:    levothyroxine  (SYNTHROID ) 175 MCG tablet, TAKE 1 TABLET BY MOUTH DAILY BEFORE BREAKFAST., Disp: 90 tablet, Rfl: 0  metoprolol  tartrate (LOPRESSOR ) 50 MG tablet, TAKE 1 TABLET BY MOUTH TWICE A DAY, Disp: 60 tablet, Rfl: 1   Multiple Vitamins-Minerals (BARIATRIC MULTIVITAMINS/IRON ) CAPS, Take 1 tablet by mouth daily., Disp: , Rfl:    NEEDLE, DISP, 25 G 25G X 1" MISC, 1 Needle by Does not apply route every 30 (thirty) days. USE TO INJECTION B12, Disp: 3 each, Rfl: 1   omeprazole  (PRILOSEC) 40 MG capsule, Take 1 capsule (40 mg total) by mouth daily., Disp: 90 capsule, Rfl: 3   PARoxetine  (PAXIL ) 40 MG tablet, TAKE 1 TABLET BY MOUTH EVERY DAY IN THE MORNING, Disp: 90 tablet, Rfl: 0   rosuvastatin  (CRESTOR ) 40 MG tablet, TAKE 1 TABLET BY MOUTH EVERY DAY, Disp: 90 tablet, Rfl: 3   Syringe/Needle, Disp, (SYRINGE 3CC/25GX5/8") 25G X 5/8" 3 ML MISC, 1 Syringe by Does not apply route every 30 (thirty) days. USE TO DRAW UP B12, Disp: 3 each, Rfl: 1   warfarin (COUMADIN ) 7.5 MG tablet, TAKE 1 1/2 TABLETS (11.25MG ) BY MOUTH DAILY EXCEPT TAKE 2  TABLETS (15 MG) ON MONDAY AND FRIDAY OR AS DIRECTED BY ANTICOAGULATION CLINIC, Disp: 135 tablet, Rfl: 1  Observations/Objective: Patient is well-developed, well-nourished in no acute distress.  Resting comfortably at home.  Head is normocephalic, atraumatic.  No labored breathing.  Speech is clear and coherent with logical content.  Patient is alert and oriented at baseline.    Assessment and Plan: 1. Upper respiratory tract infection, unspecified type (Primary) - guaiFENesin  200 MG tablet; Take 2 tablets (400 mg total) by mouth every 4 (four) hours as needed for up to 7 days for cough or to loosen phlegm.  Dispense: 30 tablet; Refill: 0 - benzonatate  (TESSALON ) 100 MG capsule; Take 2 capsules (200 mg total) by mouth 3 (three) times daily as needed for up to 7 days.  Dispense: 21 capsule; Refill: 0 - albuterol  (VENTOLIN  HFA) 108 (90 Base) MCG/ACT inhaler; Inhale 1-2 puffs into the lungs every 6 (six) hours as needed for wheezing or shortness of breath.  Dispense: 8.5 g; Refill: 0  -Start Guaifenesin , Tessalon  perles as needed -Pt requested refill on inhaler  -Advised Pt to follow up in person with urgent care or PCP if symptoms persist or worsen  Follow Up Instructions: I discussed the assessment and treatment plan with the patient. The patient was provided an opportunity to ask questions and all were answered. The patient agreed with the plan and demonstrated an understanding of the instructions.  A copy of instructions were sent to the patient via MyChart unless otherwise noted below.     The patient was advised to call back or seek an in-person evaluation if the symptoms worsen or if the condition fails to improve as anticipated.    Louvenia Roys, PA-C

## 2023-05-23 NOTE — Patient Instructions (Signed)
 [name], thank you for joining Louvenia Roys, PA-C for today's virtual visit.  While this provider is not your primary care provider (PCP), if your PCP is located in our provider database this encounter information will be shared with them immediately following your visit.   A [facility] MyChart account gives you access to today's visit and all your visits, tests, and labs performed at [facility] " click here if you don't have a [facility] MyChart account or go to [email or web address]  Consent: (Patient) [name] provided verbal consent for this virtual visit at the beginning of the encounter.  Current Medications:  Current Outpatient Medications:    benzonatate  (TESSALON ) 100 MG capsule, Take 2 capsules (200 mg total) by mouth 3 (three) times daily as needed for up to 7 days., Disp: 21 capsule, Rfl: 0   guaiFENesin  200 MG tablet, Take 2 tablets (400 mg total) by mouth every 4 (four) hours as needed for up to 7 days for cough or to loosen phlegm., Disp: 30 tablet, Rfl: 0   albuterol  (VENTOLIN  HFA) 108 (90 Base) MCG/ACT inhaler, Inhale 1-2 puffs into the lungs every 6 (six) hours as needed for wheezing or shortness of breath., Disp: 8.5 g, Rfl: 0   amoxicillin  (AMOXIL ) 500 MG tablet, TAKE 4 TABS (2000 MG) 30-60 MIN BEFORE DENTAL PROCEDURE., Disp: 4 tablet, Rfl: 2   aspirin  EC 81 MG EC tablet, Take 1 tablet (81 mg total) by mouth daily. Swallow whole., Disp: 30 tablet, Rfl: 11   azithromycin  (ZITHROMAX ) 250 MG tablet, Take first 2 tablets together, then 1 every day until finished., Disp: 6 tablet, Rfl: 0   bismuth  subsalicylate (PEPTO BISMOL) 262 MG/15ML suspension, Take 30 mLs by mouth every 6 (six) hours as needed for indigestion or diarrhea or loose stools., Disp: , Rfl:    calcium  carbonate (TUMS - DOSED IN MG ELEMENTAL CALCIUM ) 500 MG chewable tablet, Chew 1-2 tablets by mouth 3 (three) times daily as needed for indigestion or heartburn., Disp: , Rfl:     cyanocobalamin  (,VITAMIN B-12,) 1000 MCG/ML injection, Inject 1,000 mcg into the muscle every 30 (thirty) days. , Disp: , Rfl:    cyanocobalamin  (VITAMIN B12) 1000 MCG/ML injection, Inject 1 mL (1,000 mcg total) into the muscle every 6 (six) weeks., Disp: , Rfl:    cyclobenzaprine  (FLEXERIL ) 10 MG tablet, Take 1 tablet (10 mg total) by mouth 3 (three) times daily as needed., Disp: 15 tablet, Rfl: 0   ezetimibe  (ZETIA ) 10 MG tablet, TAKE 1 TABLET BY MOUTH EVERY DAY, Disp: 90 tablet, Rfl: 0   furosemide  (LASIX ) 20 MG tablet, Take 20 mg by mouth daily., Disp: , Rfl:    hydroxychloroquine  (PLAQUENIL ) 200 MG tablet, Take 200 mg by mouth 2 (two) times daily., Disp: , Rfl:    levothyroxine  (SYNTHROID ) 175 MCG tablet, TAKE 1 TABLET BY MOUTH DAILY BEFORE BREAKFAST., Disp: 90 tablet, Rfl: 0   metoprolol  tartrate (LOPRESSOR ) 50 MG tablet, TAKE 1 TABLET BY MOUTH TWICE A DAY, Disp: 60 tablet, Rfl: 1   Multiple Vitamins-Minerals (BARIATRIC MULTIVITAMINS/IRON ) CAPS, Take 1 tablet by mouth daily., Disp: , Rfl:    NEEDLE, DISP, 25 G 25G X 1" MISC, 1 Needle by Does not apply route every 30 (thirty) days. USE TO INJECTION B12, Disp: 3 each, Rfl: 1   omeprazole  (PRILOSEC) 40 MG capsule, Take 1 capsule (40 mg total) by mouth daily., Disp: 90 capsule, Rfl: 3   PARoxetine  (PAXIL ) 40 MG tablet, TAKE 1 TABLET BY MOUTH EVERY  DAY IN THE MORNING, Disp: 90 tablet, Rfl: 0   rosuvastatin  (CRESTOR ) 40 MG tablet, TAKE 1 TABLET BY MOUTH EVERY DAY, Disp: 90 tablet, Rfl: 3   Syringe/Needle, Disp, (SYRINGE 3CC/25GX5/8") 25G X 5/8" 3 ML MISC, 1 Syringe by Does not apply route every 30 (thirty) days. USE TO DRAW UP B12, Disp: 3 each, Rfl: 1   warfarin (COUMADIN ) 7.5 MG tablet, TAKE 1 1/2 TABLETS (11.25MG ) BY MOUTH DAILY EXCEPT TAKE 2 TABLETS (15 MG) ON MONDAY AND FRIDAY OR AS DIRECTED BY ANTICOAGULATION CLINIC, Disp: 135 tablet, Rfl: 1   Medications ordered in this encounter:  Meds ordered this encounter  Medications   guaiFENesin  200  MG tablet    Sig: Take 2 tablets (400 mg total) by mouth every 4 (four) hours as needed for up to 7 days for cough or to loosen phlegm.    Dispense:  30 tablet    Refill:  0   benzonatate  (TESSALON ) 100 MG capsule    Sig: Take 2 capsules (200 mg total) by mouth 3 (three) times daily as needed for up to 7 days.    Dispense:  21 capsule    Refill:  0   albuterol  (VENTOLIN  HFA) 108 (90 Base) MCG/ACT inhaler    Sig: Inhale 1-2 puffs into the lungs every 6 (six) hours as needed for wheezing or shortness of breath.    Dispense:  8.5 g    Refill:  0     *If you need refills on other medications prior to your next appointment, please contact your pharmacy*  Follow-Up: Call back or seek an in-person evaluation if the symptoms worsen or if the condition fails to improve as anticipated.  [facility] [phone]  Other Instructions Upper Respiratory Infection, Adult An upper respiratory infection (URI) is a common viral infection of the nose, throat, and upper air passages that lead to the lungs. The most common type of URI is the common cold. URIs usually get better on their own, without medical treatment. What are the causes? A URI is caused by a virus. You may catch a virus by: Breathing in droplets from an infected person's cough or sneeze. Touching something that has been exposed to the virus (is contaminated) and then touching your mouth, nose, or eyes. What increases the risk? You are more likely to get a URI if: You are very young or very old. You have close contact with others, such as at work, school, or a health care facility. You smoke. You have long-term (chronic) heart or lung disease. You have a weakened disease-fighting system (immune system). You have nasal allergies or asthma. You are experiencing a lot of stress. You have poor nutrition. What are the signs or symptoms? A URI usually involves some of the following symptoms: Runny or stuffy (congested)  nose. Cough. Sneezing. Sore throat. Headache. Fatigue. Fever. Loss of appetite. Pain in your forehead, behind your eyes, and over your cheekbones (sinus pain). Muscle aches. Redness or irritation of the eyes. Pressure in the ears or face. How is this diagnosed? This condition may be diagnosed based on your medical history and symptoms, and a physical exam. Your health care provider may use a swab to take a mucus sample from your nose (nasal swab). This sample can be tested to determine what virus is causing the illness. How is this treated? URIs usually get better on their own within 7-10 days. Medicines cannot cure URIs, but your health care provider may recommend certain medicines  to help relieve symptoms, such as: Over-the-counter cold medicines. Cough suppressants. Coughing is a type of defense against infection that helps to clear the respiratory system, so take these medicines only as recommended by your health care provider. Fever-reducing medicines. Follow these instructions at home: Activity Rest as needed. If you have a fever, stay home from work or school until your fever is gone or until your health care provider says your URI cannot spread to other people (is no longer contagious). Your health care provider may have you wear a face mask to prevent your infection from spreading. Relieving symptoms Gargle with a mixture of salt and water 3-4 times a day or as needed. To make salt water, completely dissolve -1 tsp (3-6 g) of salt in 1 cup (237 mL) of warm water. Use a cool-mist humidifier to add moisture to the air. This can help you breathe more easily. Eating and drinking  Drink enough fluid to keep your urine pale yellow. Eat soups and other clear broths. General instructions  Take over-the-counter and prescription medicines only as told by your health care provider. These include cold medicines, fever reducers, and cough suppressants. Do not use any products that  contain nicotine or tobacco. These products include cigarettes, chewing tobacco, and vaping devices, such as e-cigarettes. If you need help quitting, ask your health care provider. Stay away from secondhand smoke. Stay up to date on all immunizations, including the yearly (annual) flu vaccine. Keep all follow-up visits. This is important. How to prevent the spread of infection to others URIs can be contagious. To prevent the infection from spreading: Wash your hands with soap and water for at least 20 seconds. If soap and water are not available, use hand sanitizer. Avoid touching your mouth, face, eyes, or nose. Cough or sneeze into a tissue or your sleeve or elbow instead of into your hand or into the air.  Contact a health care provider if: You are getting worse instead of better. You have a fever or chills. Your mucus is brown or red. You have yellow or brown discharge coming from your nose. You have pain in your face, especially when you bend forward. You have swollen neck glands. You have pain while swallowing. You have white areas in the back of your throat. Get help right away if: You have shortness of breath that gets worse. You have severe or persistent: Headache. Ear pain. Sinus pain. Chest pain. You have chronic lung disease along with any of the following: Making high-pitched whistling sounds when you breathe, most often when you breathe out (wheezing). Prolonged cough (more than 14 days). Coughing up blood. A change in your usual mucus. You have a stiff neck. You have changes in your: Vision. Hearing. Thinking. Mood. These symptoms may be an emergency. Get help right away. Call 911. Do not wait to see if the symptoms will go away. Do not drive yourself to the hospital. Summary An upper respiratory infection (URI) is a common infection of the nose, throat, and upper air passages that lead to the lungs. A URI is caused by a virus. URIs usually get better on  their own within 7-10 days. Medicines cannot cure URIs, but your health care provider may recommend certain medicines to help relieve symptoms. This information is not intended to replace advice given to you by your health care provider. Make sure you discuss any questions you have with your health care provider. Document Revised: 08/22/2020 Document Reviewed: 08/22/2020 Elsevier Patient Education  2024 Elsevier  Inc.   If you have been instructed to have an in-person evaluation today at a local Urgent Care facility, please use the link below. It will take you to a list of all of our available [facility], including address, phone number and hours of operation. Please do not delay care.  [facility]  If you or a family member do not have a primary care provider, use the link below to schedule a visit and establish care. When you choose a [facility] primary care physician or advanced practice provider, you gain a long-term partner in health. Find a Primary Care Provider  Learn more about [facility] in-office and virtual care options: [facility] - Get Care Now

## 2023-06-02 ENCOUNTER — Telehealth: Payer: Self-pay

## 2023-06-02 NOTE — Telephone Encounter (Signed)
 Pt tests INR at home. LVM reminding pt it is time to test.

## 2023-06-03 ENCOUNTER — Ambulatory Visit (INDEPENDENT_AMBULATORY_CARE_PROVIDER_SITE_OTHER)

## 2023-06-03 DIAGNOSIS — Z7901 Long term (current) use of anticoagulants: Secondary | ICD-10-CM | POA: Diagnosis not present

## 2023-06-03 LAB — POCT INR: INR: 4 — AB (ref 2.0–3.0)

## 2023-06-03 NOTE — Patient Instructions (Addendum)
 Pre visit review using our clinic review tool, if applicable. No additional management support is needed unless otherwise documented below in the visit note.  Hold warfarin today and then change weekly dose to taker 1 1/2 tablet daily except take 2 tablets on Monday and recheck in 2 weeks, on 5/14.

## 2023-06-03 NOTE — Progress Notes (Addendum)
 Pt tests at home and places result on [employer]. INR today is 4.0 Hold warfarin today and then change weekly dose to taker 1 1/2 tablet daily except take 2 tablets on Monday and recheck in 2 weeks, on 5/14. Advised pt of dosing and recheck date. Pt verbalized understanding.

## 2023-06-06 ENCOUNTER — Other Ambulatory Visit: Payer: Self-pay | Admitting: Family Medicine

## 2023-06-06 DIAGNOSIS — E538 Deficiency of other specified B group vitamins: Secondary | ICD-10-CM

## 2023-06-17 ENCOUNTER — Ambulatory Visit (INDEPENDENT_AMBULATORY_CARE_PROVIDER_SITE_OTHER): Payer: Self-pay

## 2023-06-17 ENCOUNTER — Telehealth: Payer: Self-pay

## 2023-06-17 DIAGNOSIS — Z7901 Long term (current) use of anticoagulants: Secondary | ICD-10-CM | POA: Diagnosis not present

## 2023-06-17 LAB — POCT INR: INR: 2.4 (ref 2.0–3.0)

## 2023-06-17 NOTE — Patient Instructions (Addendum)
 Pre visit review using our clinic review tool, if applicable. No additional management support is needed unless otherwise documented below in the visit note.  Hold dose tonight and restart tomorrow evening with 1 1/2 tablet daily except take 2 tablets on Monday and recheck in 3 weeks, on 6/3.

## 2023-06-17 NOTE — Progress Notes (Signed)
 Pt tests at home and places result on [employer]. INR today is 2.4 Pt reports she is having a tooth extraction and bone graft and in the past she has held one dose of warfarin the day before the procedure and restarted that evening. She reports the dentist left the management up to her and the coumadin  clinic. Advised to hold warfarin tonight and then restart tomorrow evening as long as bleeding is under control. Advised if any abnormal bleeding to contact dentist or go to ER. Pt verbalized understanding. Hold dose tonight and restart tomorrow evening with 1 1/2 tablet daily except take 2 tablets on Monday and recheck in 3 weeks, on 6/3. LVM with instructions and recheck date.

## 2023-06-17 NOTE — Telephone Encounter (Signed)
 Pt tests INR at home. LVM it is time to test.

## 2023-07-01 ENCOUNTER — Emergency Department (HOSPITAL_COMMUNITY)

## 2023-07-01 ENCOUNTER — Emergency Department (HOSPITAL_COMMUNITY)
Admission: EM | Admit: 2023-07-01 | Discharge: 2023-07-01 | Disposition: A | Discharge status: Home / Self Care | Attending: Emergency Medicine | Admitting: Emergency Medicine

## 2023-07-01 ENCOUNTER — Encounter (HOSPITAL_COMMUNITY): Payer: Self-pay | Admitting: Emergency Medicine

## 2023-07-01 DIAGNOSIS — R0602 Shortness of breath: Secondary | ICD-10-CM | POA: Diagnosis present

## 2023-07-01 DIAGNOSIS — Z7901 Long term (current) use of anticoagulants: Secondary | ICD-10-CM | POA: Diagnosis not present

## 2023-07-01 DIAGNOSIS — Z7982 Long term (current) use of aspirin: Secondary | ICD-10-CM | POA: Diagnosis not present

## 2023-07-01 DIAGNOSIS — J189 Pneumonia, unspecified organism: Secondary | ICD-10-CM

## 2023-07-01 LAB — CBC
HCT: 42.5 % (ref 36.0–46.0)
Hemoglobin: 12.6 g/dL (ref 12.0–15.0)
MCH: 26.3 pg (ref 26.0–34.0)
MCHC: 29.6 g/dL — ABNORMAL LOW (ref 30.0–36.0)
MCV: 88.7 fL (ref 80.0–100.0)
Platelets: 128 10*3/uL — ABNORMAL LOW (ref 150–400)
RBC: 4.79 MIL/uL (ref 3.87–5.11)
RDW: 15.8 % — ABNORMAL HIGH (ref 11.5–15.5)
WBC: 5.2 10*3/uL (ref 4.0–10.5)
nRBC: 0 % (ref 0.0–0.2)

## 2023-07-01 LAB — TROPONIN I (HIGH SENSITIVITY)
Troponin I (High Sensitivity): 7 ng/L (ref <18)
Troponin I (High Sensitivity): 7 ng/L (ref <18)

## 2023-07-01 LAB — BASIC METABOLIC PANEL WITH GFR
Anion gap: 7 (ref 5–15)
BUN: 7 mg/dL (ref 6–20)
CO2: 29 mmol/L (ref 22–32)
Calcium: 9.2 mg/dL (ref 8.9–10.3)
Chloride: 103 mmol/L (ref 98–111)
Creatinine, Ser: 0.98 mg/dL (ref 0.44–1.00)
GFR, Estimated: >60 mL/min (ref >60)
Glucose, Bld: 136 mg/dL — ABNORMAL HIGH (ref 70–99)
Potassium: 4.5 mmol/L (ref 3.5–5.1)
Sodium: 139 mmol/L (ref 135–145)

## 2023-07-01 LAB — RESP PANEL BY RT-PCR (RSV, FLU A&B, COVID)  RVPGX2
Influenza A by PCR: NEGATIVE
Influenza B by PCR: NEGATIVE
Resp Syncytial Virus by PCR: NEGATIVE
SARS Coronavirus 2 by RT PCR: NEGATIVE

## 2023-07-01 LAB — PROTIME-INR
INR: 1.4 — ABNORMAL HIGH (ref 0.8–1.2)
Prothrombin Time: 17.3 s — ABNORMAL HIGH (ref 11.4–15.2)

## 2023-07-01 MED ORDER — AMOXICILLIN-POT CLAVULANATE 875-125 MG PO TABS
1.0000 | ORAL_TABLET | Freq: Once | ORAL | Status: AC
  Administered 2023-07-01: 1 via ORAL
  Filled 2023-07-01: qty 1

## 2023-07-01 MED ORDER — AZITHROMYCIN 250 MG PO TABS: 250.0000 mg | ORAL_TABLET | Freq: Every day | ORAL | 0 refills | Status: DC

## 2023-07-01 MED ORDER — IPRATROPIUM-ALBUTEROL 0.5-2.5 (3) MG/3ML IN SOLN
3.0000 mL | Freq: Once | RESPIRATORY_TRACT | Status: AC
  Administered 2023-07-01: 3 mL via RESPIRATORY_TRACT

## 2023-07-01 MED ORDER — AMOXICILLIN-POT CLAVULANATE 875-125 MG PO TABS: 1.0000 | ORAL_TABLET | Freq: Two times a day (BID) | ORAL | 0 refills | Status: DC

## 2023-07-01 MED ORDER — METHYLPREDNISOLONE SODIUM SUCC 125 MG IJ SOLR
125.0000 mg | Freq: Once | INTRAMUSCULAR | Status: AC
  Administered 2023-07-01: 125 mg via INTRAVENOUS
  Filled 2023-07-01: qty 2

## 2023-07-01 NOTE — Discharge Instructions (Addendum)
 You have been seen and discharged from the emergency department.  You were found to have pneumonia on your chest x-ray.  You are being treated with 2 antibiotics, you got 1 first dose here in the department as well as a breathing treatment and a dose of steroids.  Also of note your INR was slightly subtherapeutic at 1.4.  Please take your increased dose tonight and follow-up for repeat blood work.  Follow-up with your primary provider for further evaluation and further care. Take home medications as prescribed. If you have any worsening symptoms or further concerns for your health please return to an emergency department for further evaluation.

## 2023-07-01 NOTE — ED Triage Notes (Signed)
 Pt complains of chest pain, SOB on exertion and bilateral shoulder pain started 2-3 days ago. Pt states since Easter has had episode of SOB and will go away on own but today seemed worse.

## 2023-07-01 NOTE — ED Provider Notes (Signed)
  EMERGENCY DEPARTMENT AT [facility] Provider Note   CSN: [id number] Arrival date & time: 07/01/23  1655     History  Chief Complaint  Patient presents with   Shortness of Breath   Cough   Chest Pain    [name] is a 58 y.o. female.  HPI   58 year old female presents to the emergency department with intermittent chest pain, shortness of breath and productive cough of green phlegm.  This has been ongoing intermittently for the past couple weeks, worse over the last 3 days.  She was taking over-the-counter medication without significant improvement.  Has an inhaler at home that has not been significantly helping with the shortness of breath.  Denies any documented fever but endorses chills and fatigue.  Is anticoagulated on Coumadin  and compliant.  Otherwise denies any swelling of her legs or other acute symptoms.  Home Medications Prior to Admission medications   Medication Sig Start Date End Date Taking? Authorizing Provider  albuterol  (VENTOLIN  HFA) 108 (90 Base) MCG/ACT inhaler Inhale 1-2 puffs into the lungs every 6 (six) hours as needed for wheezing or shortness of breath. 05/23/23   Louvenia Roys, PA-C  amoxicillin  (AMOXIL ) 500 MG tablet TAKE 4 TABS (2000 MG) 30-60 MIN BEFORE DENTAL PROCEDURE. 12/03/22   Elmyra Haggard, MD  aspirin  EC 81 MG EC tablet Take 1 tablet (81 mg total) by mouth daily. Swallow whole. 04/11/20   Zimmerman, Donielle M, PA-C  azithromycin  (ZITHROMAX ) 250 MG tablet Take first 2 tablets together, then 1 every day until finished. 04/18/23   Kreg Pesa, PA-C  bismuth  subsalicylate (PEPTO BISMOL) 262 MG/15ML suspension Take 30 mLs by mouth every 6 (six) hours as needed for indigestion or diarrhea or loose stools.    [provider]  calcium  carbonate (TUMS - DOSED IN MG ELEMENTAL CALCIUM ) 500 MG chewable tablet Chew 1-2 tablets by mouth 3 (three) times daily as needed for indigestion or heartburn.    [provider]   cyanocobalamin  (VITAMIN B12) 1000 MCG/ML injection Inject 1 mL (1,000 mcg total) into the muscle every 6 (six) weeks. 06/08/23   Tower, Manley Seeds, MD  cyclobenzaprine  (FLEXERIL ) 10 MG tablet Take 1 tablet (10 mg total) by mouth 3 (three) times daily as needed. 12/18/22   Farris Hong, PA-C  ezetimibe  (ZETIA ) 10 MG tablet TAKE 1 TABLET BY MOUTH EVERY DAY 04/06/23   Tower, Manley Seeds, MD  furosemide  (LASIX ) 20 MG tablet Take 20 mg by mouth daily. 04/02/20   [provider]  hydroxychloroquine  (PLAQUENIL ) 200 MG tablet Take 200 mg by mouth 2 (two) times daily.    [provider]  levothyroxine  (SYNTHROID ) 175 MCG tablet TAKE 1 TABLET BY MOUTH DAILY BEFORE BREAKFAST. 04/06/23   Tower, Manley Seeds, MD  metoprolol  tartrate (LOPRESSOR ) 50 MG tablet TAKE 1 TABLET BY MOUTH TWICE A DAY 04/07/23   Elmyra Haggard, MD  Multiple Vitamins-Minerals (BARIATRIC MULTIVITAMINS/IRON ) CAPS Take 1 tablet by mouth daily.    [provider]  omeprazole  (PRILOSEC) 40 MG capsule Take 1 capsule (40 mg total) by mouth daily. 01/15/23   Tower, Manley Seeds, MD  PARoxetine  (PAXIL ) 40 MG tablet TAKE 1 TABLET BY MOUTH EVERY DAY IN THE MORNING 04/06/23   Tower, [place] A, MD  rosuvastatin  (CRESTOR ) 40 MG tablet TAKE 1 TABLET BY MOUTH EVERY DAY 09/26/22   Ross, Paula V, MD  warfarin (COUMADIN ) 7.5 MG tablet TAKE 1 1/2 TABLETS (11.25MG ) BY MOUTH DAILY EXCEPT TAKE 2 TABLETS (  15 MG) ON MONDAY AND FRIDAY OR AS DIRECTED BY ANTICOAGULATION CLINIC 05/04/23   Tower, Manley Seeds, MD      Allergies    Lisinopril , Lovenox  [enoxaparin  sodium], Moxifloxacin, and Lovenox  [enoxaparin ]    Review of Systems   Review of Systems  Constitutional:  Positive for chills and fatigue. Negative for fever.  Respiratory:  Positive for cough and shortness of breath.   Cardiovascular:  Positive for chest pain. Negative for leg swelling.  Gastrointestinal:  Negative for abdominal pain, diarrhea and vomiting.  Skin:  Negative for rash.  Neurological:   Negative for headaches.    Physical Exam Updated Vital Signs BP 132/79 (BP Location: Left Arm)   Pulse 92   Temp 98.3 F (36.8 C)   Resp (!) 25   Wt (!) 165 kg   LMP 11/08/2010   SpO2 90%   BMI 60.53 kg/m  Physical Exam Vitals and nursing note reviewed.  Constitutional:      General: She is not in acute distress.    Appearance: Normal appearance.  HENT:     Head: Normocephalic.     Mouth/Throat:     Mouth: Mucous membranes are moist.  Cardiovascular:     Rate and Rhythm: Normal rate.  Pulmonary:     Effort: Tachypnea present. No respiratory distress.     Breath sounds: Examination of the left-lower field reveals decreased breath sounds. Decreased breath sounds and wheezing present.  Abdominal:     Palpations: Abdomen is soft.     Tenderness: There is no abdominal tenderness.  Musculoskeletal:     Right lower leg: No edema.     Left lower leg: No edema.  Skin:    General: Skin is warm.  Neurological:     Mental Status: She is alert and oriented to person, place, and time. Mental status is at baseline.  Psychiatric:        Mood and Affect: Mood normal.     ED Results / Procedures / Treatments   Labs (all labs ordered are listed, but only abnormal results are displayed) Labs Reviewed  BASIC METABOLIC PANEL WITH GFR - Abnormal; Notable for the following components:      Result Value   Glucose, Bld 136 (*)    All other components within normal limits  CBC - Abnormal; Notable for the following components:   MCHC 29.6 (*)    RDW 15.8 (*)    Platelets 128 (*)    All other components within normal limits  RESP PANEL BY RT-PCR (RSV, FLU A&B, COVID)  [id number]  PROTIME-INR  TROPONIN I (HIGH SENSITIVITY)  TROPONIN I (HIGH SENSITIVITY)    EKG EKG Interpretation Date/Time:  Wednesday Jul 01 2023 17:23:06 EDT Ventricular Rate:  91 PR Interval:  206 QRS Duration:  92 QT Interval:  388 QTC Calculation: 477 R Axis:   110  Text Interpretation: Normal sinus rhythm  Possible Left atrial enlargement Right axis deviation Incomplete right bundle branch block Septal infarct , age undetermined Abnormal ECG When compared with ECG of 26-Aug-2021 13:51, PREVIOUS ECG IS PRESENT Confirmed by Florentino Hurdle [phone]) on 07/01/2023 6:26:29 PM  Radiology DG Chest 2 View Result Date: 07/01/2023 CLINICAL DATA:  Chest pain EXAM: CHEST - 2 VIEW COMPARISON:  04/18/2023, 08/26/2021, CT 01/31/2020 FINDINGS: Post sternotomy changes. 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 Cardiomegaly. Patchy airspace opacities in the right upper and lower lung compared to prior. IMPRESSION: 1. Patchy airspace opacities in the right upper and lower lung compared  to prior, suspicious for pneumonia to include atypical organisms. Radiographic follow-up to resolution is recommended. 2. 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 Electronically Signed   By: Esmeralda Hedge M.D.   On: 07/01/2023 18:29    Procedures Procedures    Medications Ordered in ED Medications  ipratropium-albuterol  (DUONEB) 0.5-2.5 (3) MG/3ML nebulizer solution 3 mL (3 mLs Nebulization Given 07/01/23 2023)    ED Course/ Medical Decision Making/ A&P                                 Medical Decision Making Amount and/or Complexity of Data Reviewed Labs: ordered. Radiology: ordered.  Risk Prescription drug management.   58 year old female presents to the emergency department with chest discomfort/tightness, shortness of breath with productive cough.  This has been ongoing for a while, worse in the last couple days.  Denies any acute hemoptysis.  Is anticoagulated on Coumadin  and compliant.  Patient at times a tachypneic but normal oxygenation on room air, no acute distress.  Blood work is reassuring, troponin is negative.  Chest x-ray shows findings of right-sided pneumonia.  Respiratory panel is negative.  Patient given a breathing treatment, dose of steroids and  oral antibiotic.  She feels improved and is conversational/ambulatory at time of reevaluation.  Of note her INR is 1.4, low for the therapeutic range.  I have advised the patient to increase her dose tonight and possibly tomorrow with follow-up outpatient blood work.  Patient will be discharged for outpatient follow-up with oral antibiotics and continued use of her home inhaler.  Patient at this time appears safe and stable for discharge and close outpatient follow up. Discharge plan and strict return to ED precautions discussed, patient verbalizes understanding and agreement.        Final Clinical Impression(s) / ED Diagnoses Final diagnoses:  None    Rx / DC Orders ED Discharge Orders     None         Flonnie Humphrey, DO 07/01/23 2330

## 2023-07-08 ENCOUNTER — Telehealth: Payer: Self-pay

## 2023-07-08 NOTE — Telephone Encounter (Signed)
 Pt tests INR at home and due for testing. Review of chart reports pt was in ER on 5/28 for pneumonia and was prescribed Augmentin  and azithromycin . Augmentin  does not interact with warfarin but azithromycin  does and will increase INR.  LVM requesting pt test INR.

## 2023-07-09 ENCOUNTER — Ambulatory Visit (INDEPENDENT_AMBULATORY_CARE_PROVIDER_SITE_OTHER): Payer: Self-pay

## 2023-07-09 DIAGNOSIS — Z7901 Long term (current) use of anticoagulants: Secondary | ICD-10-CM

## 2023-07-09 LAB — POCT INR: INR: 4.8 — AB (ref 2.0–3.0)

## 2023-07-09 NOTE — Progress Notes (Signed)
 Review of chart reports pt was in ER on 5/28 for pneumonia and was prescribed Augmentin  and azithromycin . Augmentin  does not interact with warfarin but azithromycin  does and will increase INR. Pt reports not eating much either since being sick. She is feeling better now but still has a cough. A reduction in protein intake will increase INR also. Hold dose tonight and reduce dose tomorrow to take 1 tablet and then change weekly dose to take 1 1/2 tablet daily. Recheck in 2 weeks.  Advised pt of dosing and recheck date. Pt denies any s/s of bleeding or abnormal bruising. Advised if any s/s to go to ER. Pt verbalized understanding.

## 2023-07-09 NOTE — Patient Instructions (Addendum)
 Pre visit review using our clinic review tool, if applicable. No additional management support is needed unless otherwise documented below in the visit note.  Hold dose tonight and reduce dose tomorrow to take 1 tablet and then change weekly dose to take 1 1/2 tablet daily. Recheck in 2 weeks.

## 2023-07-15 ENCOUNTER — Encounter: Payer: Self-pay | Admitting: Family Medicine

## 2023-07-15 ENCOUNTER — Ambulatory Visit: Payer: Managed Care, Other (non HMO) | Admitting: Family Medicine

## 2023-07-15 VITALS — BP 130/82 | HR 66 | Temp 98.5°F | Ht 65.0 in | Wt 360.0 lb

## 2023-07-15 DIAGNOSIS — D508 Other iron deficiency anemias: Secondary | ICD-10-CM

## 2023-07-15 DIAGNOSIS — D696 Thrombocytopenia, unspecified: Secondary | ICD-10-CM | POA: Insufficient documentation

## 2023-07-15 DIAGNOSIS — E538 Deficiency of other specified B group vitamins: Secondary | ICD-10-CM

## 2023-07-15 DIAGNOSIS — Z8701 Personal history of pneumonia (recurrent): Secondary | ICD-10-CM | POA: Diagnosis not present

## 2023-07-15 DIAGNOSIS — S80821A Blister (nonthermal), right lower leg, initial encounter: Secondary | ICD-10-CM

## 2023-07-15 DIAGNOSIS — R7303 Prediabetes: Secondary | ICD-10-CM

## 2023-07-15 DIAGNOSIS — E890 Postprocedural hypothyroidism: Secondary | ICD-10-CM

## 2023-07-15 DIAGNOSIS — E7800 Pure hypercholesterolemia, unspecified: Secondary | ICD-10-CM

## 2023-07-15 DIAGNOSIS — I10 Essential (primary) hypertension: Secondary | ICD-10-CM

## 2023-07-15 DIAGNOSIS — Z7901 Long term (current) use of anticoagulants: Secondary | ICD-10-CM

## 2023-07-15 NOTE — Assessment & Plan Note (Signed)
 bp in fair control at this time  BP Readings from Last 1 Encounters:  07/15/23 130/82   No changes needed Most recent labs reviewed  Disc lifstyle change with low sodium diet and exercise  metoprool 50 mg bid  Lasix  20 mg daily  Recent GFR over 60

## 2023-07-15 NOTE — Progress Notes (Signed)
 Subjective:    Patient ID: [name], female    DOB: 11/08/1965, 58 y.o.   MRN: [id number]  HPI  Wt Readings from Last 3 Encounters:  07/15/23 (!) 360 lb (163.3 kg)  07/01/23 (!) 363 lb 12.1 oz (165 kg)  04/18/23 (!) 365 lb (165.6 kg)   59.91 kg/m  Vitals:   07/15/23 1058  BP: 130/82  Pulse: 66  Temp: 98.5 F (36.9 C)  SpO2: 92%    Pt presents for 6 mo follow up of HTN, prediabetes, hyperlipidemia  and chronic medical problems  Also follow up of pneumonia  Blister on her leg (over a week)    Did have ER visit for pna in late may Treatment with augmentin  and azithro   Lab Results  Component Value Date   WBC 5.2 07/01/2023   HGB 12.6 07/01/2023   HCT 42.5 07/01/2023   MCV 88.7 07/01/2023   PLT 128 (L) 07/01/2023     DG Chest 2 View Result Date: 07/01/2023 CLINICAL DATA:  Chest pain EXAM: CHEST - 2 VIEW COMPARISON:  04/18/2023, 08/26/2021, CT 01/31/2020 FINDINGS: Post sternotomy changes. 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 Cardiomegaly. Patchy airspace opacities in the right upper and lower lung compared to prior. IMPRESSION: 1. Patchy airspace opacities in the right upper and lower lung compared to prior, suspicious for pneumonia to include atypical organisms. Radiographic follow-up to resolution is recommended. 2. 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 Electronically Signed   By: Esmeralda Hedge M.D.   On: 07/01/2023 18:29   Had struggled with cough since easter on and off  Now cough is improved -still has scant green sputum  Feels much better   Is a good fluid drinker /tries to drink    Has not had a pneumonia vaccine   HTN bp is stable today  No cp or palpitations or headaches or edema  No side effects to medicines  BP Readings from Last 3 Encounters:  07/15/23 130/82  07/01/23 (!) 146/88  04/18/23 (!) 144/89    Metoprolol  50 mg bid  Lasix  20 mg daily   Pulse Readings from Last 3  Encounters:  07/15/23 66  07/01/23 78  04/18/23 88    Lab Results  Component Value Date   NA 139 07/01/2023   K 4.5 07/01/2023   CO2 29 07/01/2023   GLUCOSE 136 (H) 07/01/2023   BUN 7 07/01/2023   CREATININE 0.98 07/01/2023   CALCIUM  9.2 07/01/2023   GFR 60.11 01/15/2023   EGFR 60.0 01/15/2023   GFRNONAA >60 07/01/2023   Prediabetes Lab Results  Component Value Date   HGBA1C 6.4 01/15/2023   HGBA1C 6.3 10/16/2021   HGBA1C 5.7 (H) 04/05/2020   Trying to add in more veggies to diet  Holding off on processed foods  Does daily harvest smoothies and getting extra protein   Less sweets overall (when she has grandchild)  3 times per week will have a little dessert   Not a lot of exercise  Has not been able to move as much as she wants to - wipes her out  Plans to talk to cardiology about that  No chest pain     Hypothyroidism  Pt has no clinical changes No change in energy level/ hair or skin/ edema and no tremor Lab Results  Component Value Date   TSH 2.48 01/15/2023    Levothyroxine  175 mcg daily   Blister on lower right leg No known trauma but  not impossible  Area around was sensitive but it is improved   Needs INR check since being on antibiotic Lab Results  Component Value Date   INR 4.8 (A) 07/09/2023   INR 1.4 (H) 07/01/2023   INR 2.4 06/17/2023   PROTIME 15.6 (H) 12/06/2012   PROTIME 19.2 (H) 06/03/2012   Lipids Lab Results  Component Value Date   CHOL 161 01/15/2023   HDL 55.40 01/15/2023   LDLCALC 84 01/15/2023   LDLDIRECT 203.7 06/03/2011   TRIG 109.0 01/15/2023   CHOLHDL 3 01/15/2023     Patient Active Problem List   Diagnosis Date Noted   History of pneumonia 07/15/2023   Thrombocytopenia (HCC) 07/15/2023   Blister of right lower extremity 07/15/2023   Encounter for hepatitis C screening test for low risk patient 01/15/2023   Globus sensation 10/23/2021   History of bariatric surgery 01/08/2021   IDA (iron  deficiency anemia)  06/28/2020   Iron  deficiency anemia 06/18/2020   History of atrial fibrillation 06/18/2020   S/P aortic valve replacement with bioprosthetic valve 04/06/2020   Coronary artery disease 04/06/2020   Chest pain of uncertain etiology 01/17/2020   Esophageal diverticulum    Esophageal dysphagia    Fatigue 05/20/2019   Systemic lupus erythematosus with lung involvement (HCC) 01/04/2019   Aortic stenosis 08/15/2017   Vitamin B12 deficiency 11/15/2015   Nausea & vomiting 10/15/2015   Allergic rhinitis 07/01/2015   History of cerebral hemorrhage 07/01/2015   Chronic daily headache 06/29/2015   Encounter for therapeutic drug monitoring 03/25/2013   CKD (chronic kidney disease) stage 2, GFR 60-89 ml/min 12/20/2012   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   Routine general medical examination at a health care facility 12/07/2012   History of HPV infection 12/07/2012   Long term (current) use of anticoagulants 10/06/2012   Chronic anticoagulation 04/22/2012   S/P bariatric surgery 04/22/2012   Homozygous Factor V Leiden mutation (HCC) 04/22/2012   OSA on CPAP 11/20/2011   Prediabetes 09/16/2010   EDEMA 01/08/2010   Pleural effusion 08/27/2009   Enlarged lymph nodes 08/13/2009   History of pulmonary embolism 05/24/2009   Morbid obesity (HCC) 12/08/2007   Hypothyroidism 06/09/2006   HYPERCHOLESTEROLEMIA 06/09/2006   Generalized anxiety disorder 06/09/2006   PANIC DISORDER 06/09/2006   Essential hypertension 06/09/2006   Nephritis and nephropathy, with pathological lesion in kidney 06/09/2006   Systemic lupus erythematosus (HCC) 06/09/2006   DVT, HX OF 06/09/2006   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    Loculated-chronic on Left-VanTright; s/p VATS 5/10    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   GERD (gastroesophageal reflux disease)    on prilosec r/t gastric sleeve surgery   HLD (hyperlipidemia)    Iron  deficiency anemia    IV dextran [place]   Nephritis and nephropathy, not specified as acute or chronic, with unspecified pathological lesion in kidney    Nonspecific abnormal results of liver function study    Obesity, unspecified    Other specified acquired hypothyroidism    Panic disorder without agoraphobia    Personal history of venous thrombosis and embolism 2002   during pregnancy; ?factor 5 leiden (sees heme)   Pleural effusion 2005   c/w lupus  initial w/u; recurrent Right as pred tapered off July 2011   Sleep apnea    Systemic lupus erythematosus (HCC)    renal GN, hx of pericardial eff in late 90's   Unspecified essential hypertension    Past Surgical History:  Procedure Laterality Date   AORTIC VALVE REPLACEMENT N/A 04/06/2020   Procedure: AORTIC VALVE REPLACEMENT (AVR) USING INSPIRIS 21 MM AORTIC VALVE;  Surgeon: Bartley Lightning, MD;  Location: [facility] OR;  Service: Open Heart Surgery;  Laterality: N/A;   BIOPSY  04/12/2019   Procedure: BIOPSY;  Surgeon: Lindle Rhea, MD;  Location: [facility];  Service: Gastroenterology;;   CARDIAC CATHETERIZATION     ESOPHAGEAL MANOMETRY N/A 07/15/2019   Procedure: ESOPHAGEAL MANOMETRY (EM);  Surgeon: Sergio Dandy, MD;  Location: [facility];  Service: Endoscopy;  Laterality: N/A;   ESOPHAGOGASTRODUODENOSCOPY (EGD) WITH PROPOFOL  N/A 04/12/2019   Procedure: ESOPHAGOGASTRODUODENOSCOPY (EGD) WITH PROPOFOL ;  Surgeon: Lindle Rhea, MD;  Location: [facility];  Service: Gastroenterology;  Laterality: N/A;   gastric sleeve  09/04/2010   bariatric surgery Dr Tresa Frohlich    LAPAROSCOPIC TUBAL LIGATION  12/09/2010   Procedure: LAPAROSCOPIC TUBAL LIGATION;  Surgeon: Chandler Combs;  Location: [facility];  Service: Gynecology;  Laterality: Bilateral;  Attempted see Nursing note    pleurocentesis  10/04/2004   Pleuryx catheter placement  10/04/2009   VanTright----removed 9/11   RENAL BIOPSY  09/24/2012   RIGHT/LEFT HEART CATH AND CORONARY ANGIOGRAPHY N/A 01/19/2020   Procedure: RIGHT/LEFT HEART CATH AND CORONARY ANGIOGRAPHY;  Surgeon: Avanell Leigh, MD;  Location: [facility];  Service: Cardiovascular;  Laterality: N/A;   svd     x 3   TEE WITHOUT CARDIOVERSION N/A 04/06/2020   Procedure: TRANSESOPHAGEAL ECHOCARDIOGRAM (TEE);  Surgeon: Bartley Lightning, MD;  Location: [facility] OR;  Service: Open Heart Surgery;  Laterality: N/A;   THORACENTESIS  02/04/2008   with penumonia   WISDOM TOOTH EXTRACTION     Social History   Tobacco Use   Smoking status: Former    Current packs/day: 0.00    Average packs/day: 0.5 packs/day for 10.0 years (5.0 ttl pk-yrs)    Types: Cigarettes    Start date: 05/05/1999    Quit date: 05/04/2009    Years since quitting: 14.2   Smokeless tobacco: Never   Tobacco comments:    Socially x10 years (1 pack/month)  Vaping Use   Vaping status: Never Used  Substance Use Topics   Alcohol use: Yes    Comment: rare   Drug use: No   Family History  Problem Relation Age of Onset   Lupus Sister    Hypertension Father    Hyperlipidemia Father    Leukemia Sister    Diabetes Other        GM   Colon cancer Neg Hx    Esophageal cancer Neg Hx    Pancreatic cancer Neg Hx    Allergies  Allergen Reactions   Lisinopril  Cough   Lovenox  [Enoxaparin  Sodium] Itching   Moxifloxacin Other (See Comments)    REACTION: hallucinations   Lovenox  [Enoxaparin ]     Itching    Current Outpatient Medications on File Prior to Visit  Medication Sig Dispense Refill   albuterol  (VENTOLIN  HFA) 108 (90 Base) MCG/ACT inhaler Inhale 1-2 puffs into the lungs every 6 (six) hours as needed for wheezing or shortness of breath. 8.5 g 0   amoxicillin  (AMOXIL ) 500 MG tablet TAKE 4 TABS (2000 MG) 30-60 MIN BEFORE DENTAL PROCEDURE. 4 tablet 2  aspirin  EC 81 MG EC  tablet Take 1 tablet (81 mg total) by mouth daily. Swallow whole. 30 tablet 11   bismuth  subsalicylate (PEPTO BISMOL) 262 MG/15ML suspension Take 30 mLs by mouth every 6 (six) hours as needed for indigestion or diarrhea or loose stools.     calcium  carbonate (TUMS - DOSED IN MG ELEMENTAL CALCIUM ) 500 MG chewable tablet Chew 1-2 tablets by mouth 3 (three) times daily as needed for indigestion or heartburn.     cyanocobalamin  (VITAMIN B12) 1000 MCG/ML injection Inject 1 mL (1,000 mcg total) into the muscle every 6 (six) weeks. 1 mL 2   cyclobenzaprine  (FLEXERIL ) 10 MG tablet Take 1 tablet (10 mg total) by mouth 3 (three) times daily as needed. 15 tablet 0   ezetimibe  (ZETIA ) 10 MG tablet TAKE 1 TABLET BY MOUTH EVERY DAY 90 tablet 0   furosemide  (LASIX ) 20 MG tablet Take 20 mg by mouth daily.     hydroxychloroquine  (PLAQUENIL ) 200 MG tablet Take 200 mg by mouth 2 (two) times daily.     levothyroxine  (SYNTHROID ) 175 MCG tablet TAKE 1 TABLET BY MOUTH DAILY BEFORE BREAKFAST. 90 tablet 0   metoprolol  tartrate (LOPRESSOR ) 50 MG tablet TAKE 1 TABLET BY MOUTH TWICE A DAY 60 tablet 1   Multiple Vitamins-Minerals (BARIATRIC MULTIVITAMINS/IRON ) CAPS Take 1 tablet by mouth daily.     omeprazole  (PRILOSEC) 40 MG capsule Take 1 capsule (40 mg total) by mouth daily. 90 capsule 3   PARoxetine  (PAXIL ) 40 MG tablet TAKE 1 TABLET BY MOUTH EVERY DAY IN THE MORNING 90 tablet 0   rosuvastatin  (CRESTOR ) 40 MG tablet TAKE 1 TABLET BY MOUTH EVERY DAY 90 tablet 3   warfarin (COUMADIN ) 7.5 MG tablet TAKE 1 1/2 TABLETS (11.25MG ) BY MOUTH DAILY EXCEPT TAKE 2 TABLETS (15 MG) ON MONDAY AND FRIDAY OR AS DIRECTED BY ANTICOAGULATION CLINIC 135 tablet 1   No current facility-administered medications on file prior to visit.    Review of Systems  Constitutional:  Positive for fatigue. Negative for activity change, appetite change, fever and unexpected weight change.       Poor physical endurance w/o shortness of breath or cp  HENT:   Negative for congestion, ear pain, rhinorrhea, sinus pressure and sore throat.   Eyes:  Negative for pain, redness and visual disturbance.  Respiratory:  Positive for cough. Negative for shortness of breath and wheezing.        Improved cough and breathing   Cardiovascular:  Negative for chest pain and palpitations.  Gastrointestinal:  Negative for abdominal pain, blood in stool, constipation and diarrhea.  Endocrine: Negative for polydipsia and polyuria.  Genitourinary:  Negative for dysuria, frequency and urgency.  Musculoskeletal:  Negative for arthralgias, back pain and myalgias.  Skin:  Negative for pallor and rash.  Allergic/Immunologic: Negative for environmental allergies.  Neurological:  Negative for dizziness, syncope and headaches.  Hematological:  Negative for adenopathy. Does not bruise/bleed easily.  Psychiatric/Behavioral:  Negative for decreased concentration and dysphoric mood. The patient is not nervous/anxious.        Objective:   Physical Exam Constitutional:      General: She is not in acute distress.    Appearance: Normal appearance. She is well-developed. She is obese. She is not ill-appearing or diaphoretic.  HENT:     Head: Normocephalic and atraumatic.  Eyes:     Conjunctiva/sclera: Conjunctivae normal.     Pupils: Pupils are equal, round, and reactive to light.  Neck:     Thyroid :  No thyromegaly.     Vascular: No carotid bruit or JVD.  Cardiovascular:     Rate and Rhythm: Normal rate and regular rhythm.     Heart sounds: Normal heart sounds.     No gallop.  Pulmonary:     Effort: Pulmonary effort is normal. No respiratory distress.     Breath sounds: Normal breath sounds. No stridor. No wheezing, rhonchi or rales.     Comments: Good air exch No rales  Bs are mildly distant   Abdominal:     General: There is no distension or abdominal bruit.     Palpations: Abdomen is soft.  Musculoskeletal:     Cervical back: Normal range of motion and neck  supple.     Right lower leg: No edema.     Left lower leg: No edema.     Comments: Baseline hyperpigmentation of ankles     Lymphadenopathy:     Cervical: No cervical adenopathy.  Skin:    General: Skin is warm and dry.     Coloration: Skin is not pale.     Findings: No rash.     Comments: 1-2 cm diameter clear blister on right anterior low leg near ankle Non tender No erythema  No drainage   Neurological:     Mental Status: She is alert.     Coordination: Coordination normal.     Deep Tendon Reflexes: Reflexes are normal and symmetric. Reflexes normal.  Psychiatric:        Mood and Affect: Mood normal.           Assessment & Plan:   Problem List Items Addressed This Visit       Cardiovascular and Mediastinum   Essential hypertension - Primary   bp in fair control at this time  BP Readings from Last 1 Encounters:  07/15/23 130/82   No changes needed Most recent labs reviewed  Disc lifstyle change with low sodium diet and exercise  metoprool 50 mg bid  Lasix  20 mg daily  Recent GFR over 60           Musculoskeletal and Integument   Blister of right lower extremity   Right low leg/anterior ankle  Clear blister w/o redness or sign of infection  Unsure of cause  Instructed to keep clean with soap/water and do not puncture  Watch for redness/pain/drainage Update if not starting to improve in a week or if worsening   Call back and Er precautions noted in detail today          Hematopoietic and Hemostatic   Thrombocytopenia (HCC)   Relevant Orders   CBC with Differential/Platelet     Other   Prediabetes   Last A1c was 6.4- closer to DM cut off  Has since been eating more veggies and protein and less sweets  Less processed foods  Not much exercise however due to poor stamina (discussed option of chair program)  A1c ordered       Relevant Orders   Hemoglobin A1c   History of pneumonia   Seen in ER and treated  Reviewed hospital records, lab  results and studies in detail  Much clinical improvement after course of augmentin  and azithromycin    Reassuring exam Will plan re check cxr and also give prevnar 20 at end of July (8 week mark) Instructed to call if symptoms return or worsen   Call back and Er precautions noted in detail today        Chronic anticoagulation  Recently took antibiotics in setting of warfarin   INR ordered       Relevant Orders   Protime-INR

## 2023-07-15 NOTE — Patient Instructions (Addendum)
 To prevent diabetes Try to get most of your carbohydrates from produce (with the exception of white potatoes) and whole grains Eat less bread/pasta/rice/snack foods/cereals/sweets and other items from the middle of the grocery store (processed carbs)    Follow up in late July/ early August for re check and pneumonia shot and repeat chest xray   Think about a chair exercise program (on line/ video/DVD)   Talk to your cardiologist about cardiac rehab again    Labs today  A1c Cbc INR

## 2023-07-15 NOTE — Assessment & Plan Note (Signed)
 Recently took antibiotics in setting of warfarin   INR ordered

## 2023-07-15 NOTE — Assessment & Plan Note (Signed)
 Last A1c was 6.4- closer to DM cut off  Has since been eating more veggies and protein and less sweets  Less processed foods  Not much exercise however due to poor stamina (discussed option of chair program)  A1c ordered

## 2023-07-15 NOTE — Assessment & Plan Note (Signed)
 Right low leg/anterior ankle  Clear blister w/o redness or sign of infection  Unsure of cause  Instructed to keep clean with soap/water and do not puncture  Watch for redness/pain/drainage Update if not starting to improve in a week or if worsening   Call back and Er precautions noted in detail today

## 2023-07-15 NOTE — Assessment & Plan Note (Signed)
 Seen in ER and treated  Reviewed hospital records, lab results and studies in detail  Much clinical improvement after course of augmentin  and azithromycin    Reassuring exam Will plan re check cxr and also give prevnar 20 at end of July (8 week mark) Instructed to call if symptoms return or worsen   Call back and Er precautions noted in detail today

## 2023-07-16 ENCOUNTER — Ambulatory Visit (INDEPENDENT_AMBULATORY_CARE_PROVIDER_SITE_OTHER): Payer: Self-pay

## 2023-07-16 ENCOUNTER — Ambulatory Visit: Payer: Self-pay | Admitting: Family Medicine

## 2023-07-16 DIAGNOSIS — Z7901 Long term (current) use of anticoagulants: Secondary | ICD-10-CM

## 2023-07-16 LAB — CBC WITH DIFFERENTIAL/PLATELET
Absolute Lymphocytes: 1422 {cells}/uL (ref 850–3900)
Absolute Monocytes: 442 {cells}/uL (ref 200–950)
Basophils Absolute: 62 {cells}/uL (ref 0–200)
Basophils Relative: 1.1 %
Eosinophils Absolute: 280 {cells}/uL (ref 15–500)
Eosinophils Relative: 5 %
HCT: 41.5 % (ref 35.0–45.0)
Hemoglobin: 12.4 g/dL (ref 11.7–15.5)
MCH: 25.7 pg — ABNORMAL LOW (ref 27.0–33.0)
MCHC: 29.9 g/dL — ABNORMAL LOW (ref 32.0–36.0)
MCV: 85.9 fL (ref 80.0–100.0)
MPV: 11.1 fL (ref 7.5–12.5)
Monocytes Relative: 7.9 %
Neutro Abs: 3394 {cells}/uL (ref 1500–7800)
Neutrophils Relative %: 60.6 %
Platelets: 204 10*3/uL (ref 140–400)
RBC: 4.83 10*6/uL (ref 3.80–5.10)
RDW: 14.9 % (ref 11.0–15.0)
Total Lymphocyte: 25.4 %
WBC: 5.6 10*3/uL (ref 3.8–10.8)

## 2023-07-16 LAB — HEMOGLOBIN A1C
Hgb A1c MFr Bld: 6.3 % — ABNORMAL HIGH (ref <5.7)
Mean Plasma Glucose: 134 mg/dL
eAG (mmol/L): 7.4 mmol/L

## 2023-07-16 LAB — PROTIME-INR
INR: 2.4 — ABNORMAL HIGH
Prothrombin Time: 24.4 s — ABNORMAL HIGH (ref 9.0–11.5)

## 2023-07-16 NOTE — Progress Notes (Signed)
 Pt had PCP apt yesterday and lab INR was drawn. Result sent to coumadin  clinic to f/u with dosing. Increase dose today to take 2 tablets and then continue 1 1/2 tablet daily. Recheck on 6/24.  LVM with dosing instructions and recheck date.

## 2023-07-16 NOTE — Patient Instructions (Addendum)
 Pre visit review using our clinic review tool, if applicable. No additional management support is needed unless otherwise documented below in the visit note.  Increase dose today to take 2 tablets and then continue 1 1/2 tablet daily. Recheck on 6/24.

## 2023-07-29 ENCOUNTER — Encounter: Payer: Self-pay | Admitting: Internal Medicine

## 2023-07-29 ENCOUNTER — Telehealth: Payer: Self-pay

## 2023-07-29 ENCOUNTER — Ambulatory Visit: Payer: Self-pay

## 2023-07-29 ENCOUNTER — Ambulatory Visit (INDEPENDENT_AMBULATORY_CARE_PROVIDER_SITE_OTHER): Payer: Self-pay

## 2023-07-29 DIAGNOSIS — Z7901 Long term (current) use of anticoagulants: Secondary | ICD-10-CM | POA: Diagnosis not present

## 2023-07-29 DIAGNOSIS — G4733 Obstructive sleep apnea (adult) (pediatric): Secondary | ICD-10-CM

## 2023-07-29 LAB — POCT INR: INR: 1.7 — AB (ref 2.0–3.0)

## 2023-07-29 NOTE — Patient Instructions (Addendum)
 Pre visit review using our clinic review tool, if applicable. No additional management support is needed unless otherwise documented below in the visit note.  Increase dose today to take 2 1/2 tablets and increase dose tomorrow to take 2 tablets and then change weekly dose to take 1 1/2 tablet daily except take 2 tablets on Monday and Thursday. Recheck on 7/1.

## 2023-07-29 NOTE — Telephone Encounter (Signed)
 It is time for pt to test INR. LVM for pt to test.

## 2023-07-29 NOTE — Telephone Encounter (Signed)
 FYI Only or Action Required?: FYI only for provider.  Patient was last seen in primary care on 07/15/2023 by Randeen Laine LABOR, MD. Called Nurse Triage reporting Cough. Symptoms began yesterday. Interventions attempted: Nothing. Symptoms are: unchanged.  Triage Disposition: See Physician Within 24 Hours  Patient/caregiver understands and will follow disposition?: YES  **Appt. Scheduled with PCP for 6/26**                        Copied from CRM #[id number]. Topic: Clinical - Red Word Triage >> Jul 29, 2023  1:04 PM Henretta I wrote: Red Word that prompted transfer to Nurse Triage: Had pneumonia, wheezing, and when coughing is coughing up some blood. Reason for Disposition  Coughing up rusty-colored (reddish-brown) sputum  Answer Assessment - Initial Assessment Questions 1. ONSET: When did the cough begin?      X 1 day ago.   2. SEVERITY: How bad is the cough today?     Mild  3. SPUTUM: Describe the color of your sputum (none, dry cough; clear, white, yellow, green)     Pale yellow  4. HEMOPTYSIS: Are you coughing up any blood? If so ask: How much? (flecks, streaks, tablespoons, etc.)     Darker red in color, mixed in sputum , the amount is little  5. DIFFICULTY BREATHING: Are you having difficulty breathing? If Yes, ask: How bad is it? (e.g., mild, moderate, severe)    - MILD: No SOB at rest, mild SOB with walking, speaks normally in sentences, can lie down, no retractions, pulse < 100.    - MODERATE: SOB at rest, SOB with minimal exertion and prefers to sit, cannot lie down flat, speaks in phrases, mild retractions, audible wheezing, pulse 100-120.    - SEVERE: Very SOB at rest, speaks in single words, struggling to breathe, sitting hunched forward, retractions, pulse > 120      No    6. FEVER: Do you have a fever? If Yes, ask: What is your temperature, how was it measured, and when did it start?    Mild grade, yesterday   7. CARDIAC HISTORY:  Do you have any history of heart disease? (e.g., heart attack, congestive heart failure)      Valve replacement a few years ago  8. LUNG HISTORY: Do you have any history of lung disease?  (e.g., pulmonary embolus, asthma, emphysema)     No   9. PE RISK FACTORS: Do you have a history of blood clots? (or: recent major surgery, recent prolonged travel, bedridden)     Yes  10. OTHER SYMPTOMS: Do you have any other symptoms? (e.g., runny nose, wheezing, chest pain)       Wheezing intermittently when sitting, goes away with movement.  Recently diagnosed with pneumonia earlier this month.  Protocols used: Cough - Acute Productive-A-AH

## 2023-07-29 NOTE — Progress Notes (Addendum)
 Pt tests at home and places result on [employer]. INR today is 1.7 Review of chart reports pt contacted cardiology today reporting she still has a cough from recent pneumonia diagnosis on 5/28, and has some hemoptysis, small amount bright red x 1 day. Pt was prescribed azithromycin  and Augmentin  at that time. Pt was scheduled by triage for a PCP apt for tomorrow.  Increase dose today to take 2 1/2 tablets and increase dose tomorrow to take 2 tablets and then change weekly dose to take 1 1/2 tablet daily except take 2 tablets on Monday and Thursday. Recheck on 7/1.  Advised pt of dose change and recheck date. Advised if any s/s of a clot or increase in hemoptysis to go to ER. Pt verbalized understanding.

## 2023-07-29 NOTE — Telephone Encounter (Signed)
 Will see patient then Agree with ER and UC precautions

## 2023-07-30 ENCOUNTER — Ambulatory Visit (INDEPENDENT_AMBULATORY_CARE_PROVIDER_SITE_OTHER)
Admission: RE | Admit: 2023-07-30 | Discharge: 2023-07-30 | Disposition: A | Discharge status: Home / Self Care | Source: Ambulatory Visit | Attending: Family Medicine | Admitting: Family Medicine

## 2023-07-30 ENCOUNTER — Encounter: Payer: Self-pay | Admitting: Family Medicine

## 2023-07-30 ENCOUNTER — Ambulatory Visit: Payer: Self-pay | Admitting: Family Medicine

## 2023-07-30 ENCOUNTER — Ambulatory Visit (INDEPENDENT_AMBULATORY_CARE_PROVIDER_SITE_OTHER): Admitting: Family Medicine

## 2023-07-30 VITALS — BP 130/78 | HR 71 | Temp 98.3°F | Ht 65.0 in | Wt 360.4 lb

## 2023-07-30 DIAGNOSIS — R059 Cough, unspecified: Secondary | ICD-10-CM | POA: Insufficient documentation

## 2023-07-30 DIAGNOSIS — R042 Hemoptysis: Secondary | ICD-10-CM | POA: Diagnosis not present

## 2023-07-30 DIAGNOSIS — J069 Acute upper respiratory infection, unspecified: Secondary | ICD-10-CM | POA: Diagnosis not present

## 2023-07-30 LAB — POC COVID19 BINAXNOW: SARS Coronavirus 2 Ag: NEGATIVE

## 2023-07-30 MED ORDER — BENZONATATE 200 MG PO CAPS: 200.0000 mg | ORAL_CAPSULE | Freq: Three times a day (TID) | ORAL | 1 refills | Status: DC | PRN

## 2023-07-30 MED ORDER — PREDNISONE 10 MG PO TABS
ORAL_TABLET | ORAL | 0 refills | Status: DC
Start: 2023-07-30 — End: 2023-09-02

## 2023-07-30 MED ORDER — ALBUTEROL SULFATE HFA 108 (90 BASE) MCG/ACT IN AERS: 1.0000 | INHALATION_SPRAY | Freq: Four times a day (QID) | RESPIRATORY_TRACT | 3 refills | Status: AC | PRN

## 2023-07-30 NOTE — Progress Notes (Signed)
 Subjective:    Patient ID: [name], female    DOB: 06-09-1965, 58 y.o.   MRN: [id number]  HPI  Wt Readings from Last 3 Encounters:  07/30/23 (!) 360 lb 6 oz (163.5 kg)  07/15/23 (!) 360 lb (163.3 kg)  07/01/23 (!) 363 lb 12.1 oz (165 kg)   59.97 kg/m  Vitals:   07/30/23 1152  BP: 130/78  Pulse: 71  Temp: 98.3 F (36.8 C)  SpO2: 91%    Pt presents for c/o Cough-prod of rusty colored and yellow phlegm (some blood in mucous) -pt does take warfarin  Started 1.5 days ago   Was worried that her pna was coming back   Has albuterol  mdi-has not used today   Mild headache No nasal symptoms   Low grade temp- felt like it  [place]y   Pulse ox 91% Some wheezing   Of note-had pna in late may  Treatment in ER - given azithro and augmentin  , neg resp panel  Had breathing treatment and methyl prednisolone   DG Chest 2 View Result Date: 07/01/2023 CLINICAL DATA:  Chest pain EXAM: CHEST - 2 VIEW COMPARISON:  04/18/2023, 08/26/2021, CT 01/31/2020 FINDINGS: Post sternotomy changes. 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 Cardiomegaly. Patchy airspace opacities in the right upper and lower lung compared to prior. IMPRESSION: 1. Patchy airspace opacities in the right upper and lower lung compared to prior, suspicious for pneumonia to include atypical organisms. Radiographic follow-up to resolution is recommended. 2. 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 Electronically Signed   By: Luke Bun M.D.   On: 07/01/2023 18:29   We were planning to do repeat cxr and pna shot in late July  Lab Results  Component Value Date   INR 1.7 (A) 07/29/2023   INR 2.4 (H) 07/15/2023   INR 4.8 (A) 07/09/2023   PROTIME 15.6 (H) 12/06/2012   PROTIME 19.2 (H) 06/03/2012  Went up on warfarin    Cxr today DG Chest 2 View Result Date: 07/30/2023 CLINICAL DATA:  Cough, wheezing. EXAM: CHEST - 2 VIEW COMPARISON:  Jul 01, 2023.   January 31, 2020. FINDINGS: Stable cardiomediastinal silhouette. Status post aortic valve repair. Stable large peripherally calcified pleural fluid collection is noted posteriorly in left lung base. Minimal right basilar atelectasis or scarring is noted. Bony thorax is unremarkable. IMPRESSION: Stable large peripherally calcified left pleural fluid collection is noted. Minimal right basilar atelectasis or scarring is noted. Electronically Signed   By: Lynwood Landy Raddle M.D.   On: 07/30/2023 13:20   DG Chest 2 View Result Date: 07/01/2023 CLINICAL DATA:  Chest pain EXAM: CHEST - 2 VIEW COMPARISON:  04/18/2023, 08/26/2021, CT 01/31/2020 FINDINGS: Post sternotomy changes. 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 Cardiomegaly. Patchy airspace opacities in the right upper and lower lung compared to prior. IMPRESSION: 1. Patchy airspace opacities in the right upper and lower lung compared to prior, suspicious for pneumonia to include atypical organisms. Radiographic follow-up to resolution is recommended. 2. 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 Electronically Signed   By: Luke Bun M.D.   On: 07/01/2023 18:29      Neg covid test today   Patient Active Problem List   Diagnosis Date Noted   Cough 07/30/2023   Hemoptysis 07/30/2023   History of pneumonia 07/15/2023   Thrombocytopenia (HCC) 07/15/2023   Blister of right lower extremity 07/15/2023   Encounter for hepatitis C screening test for  low risk patient 01/15/2023   Globus sensation 10/23/2021   History of bariatric surgery 01/08/2021   IDA (iron  deficiency anemia) 06/28/2020   Iron  deficiency anemia 06/18/2020   History of atrial fibrillation 06/18/2020   S/P aortic valve replacement with bioprosthetic valve 04/06/2020   Coronary artery disease 04/06/2020   Chest pain of uncertain etiology 01/17/2020   Esophageal diverticulum    Esophageal dysphagia    Fatigue 05/20/2019    Systemic lupus erythematosus with lung involvement (HCC) 01/04/2019   Aortic stenosis 08/15/2017   Vitamin B12 deficiency 11/15/2015   Nausea & vomiting 10/15/2015   Allergic rhinitis 07/01/2015   History of cerebral hemorrhage 07/01/2015   Chronic daily headache 06/29/2015   Encounter for therapeutic drug monitoring 03/25/2013   CKD (chronic kidney disease) stage 2, GFR 60-89 ml/min 12/20/2012   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   Routine general medical examination at a health care facility 12/07/2012   History of HPV infection 12/07/2012   Long term (current) use of anticoagulants 10/06/2012   Chronic anticoagulation 04/22/2012   S/P bariatric surgery 04/22/2012   Homozygous Factor V Leiden mutation (HCC) 04/22/2012   OSA on CPAP 11/20/2011   Prediabetes 09/16/2010   EDEMA 01/08/2010   Pleural effusion 08/27/2009   Enlarged lymph nodes 08/13/2009   History of pulmonary embolism 05/24/2009   Morbid obesity (HCC) 12/08/2007   Hypothyroidism 06/09/2006   HYPERCHOLESTEROLEMIA 06/09/2006   Generalized anxiety disorder 06/09/2006   PANIC DISORDER 06/09/2006   Essential hypertension 06/09/2006   Nephritis and nephropathy, with pathological lesion in kidney 06/09/2006   Systemic lupus erythematosus (HCC) 06/09/2006   DVT, HX OF 06/09/2006   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    Loculated-chronic on Left-VanTright; s/p VATS 5/10   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   HLD (hyperlipidemia)    Iron  deficiency anemia    IV dextran [place]   Nephritis and nephropathy, not specified as acute or chronic, with unspecified pathological  lesion in kidney    Nonspecific abnormal results of liver function study    Obesity, unspecified    Other specified acquired hypothyroidism    Panic disorder without agoraphobia    Personal history of venous thrombosis and embolism 2002   during pregnancy; ?factor 5 leiden (sees heme)   Pleural effusion 2005   c/w lupus initial w/u; recurrent Right as pred tapered off July 2011   Sleep apnea    Systemic lupus erythematosus (HCC)    renal GN, hx of pericardial eff in late 90's   Unspecified essential hypertension    Past Surgical History:  Procedure Laterality Date   AORTIC VALVE REPLACEMENT N/A 04/06/2020   Procedure: AORTIC VALVE REPLACEMENT (AVR) USING INSPIRIS 21 MM AORTIC VALVE;  Surgeon: Lucas Dorise POUR, MD;  Location: [facility];  Service: Open Heart Surgery;  Laterality: N/A;   BIOPSY  04/12/2019   Procedure: BIOPSY;  Surgeon: Eda Iha, MD;  Location: [facility];  Service: Gastroenterology;;   CARDIAC CATHETERIZATION     CARDIAC VALVE REPLACEMENT  [phone]   ESOPHAGEAL MANOMETRY N/A 07/15/2019  Procedure: ESOPHAGEAL MANOMETRY (EM);  Surgeon: Shila Gustav GAILS, MD;  Location: [facility];  Service: Endoscopy;  Laterality: N/A;   ESOPHAGOGASTRODUODENOSCOPY (EGD) WITH PROPOFOL  N/A 04/12/2019   Procedure: ESOPHAGOGASTRODUODENOSCOPY (EGD) WITH PROPOFOL ;  Surgeon: Eda Iha, MD;  Location: [facility];  Service: Gastroenterology;  Laterality: N/A;   gastric sleeve  09/04/2010   bariatric surgery Dr Dela    LAPAROSCOPIC TUBAL LIGATION  12/09/2010   Procedure: LAPAROSCOPIC TUBAL LIGATION;  Surgeon: Norleen GORMAN Skill;  Location: [facility];  Service: Gynecology;  Laterality: Bilateral;  Attempted see Nursing note   pleurocentesis  10/04/2004   Pleuryx catheter placement  10/04/2009   VanTright----removed 9/11   RENAL BIOPSY  09/24/2012   RIGHT/LEFT HEART CATH AND CORONARY ANGIOGRAPHY N/A 01/19/2020   Procedure: RIGHT/LEFT HEART CATH AND CORONARY ANGIOGRAPHY;  Surgeon:  Court Dorn PARAS, MD;  Location: [facility];  Service: Cardiovascular;  Laterality: N/A;   svd     x 3   TEE WITHOUT CARDIOVERSION N/A 04/06/2020   Procedure: TRANSESOPHAGEAL ECHOCARDIOGRAM (TEE);  Surgeon: Lucas Dorise POUR, MD;  Location:  [facility];  Service: Open Heart Surgery;  Laterality: N/A;   THORACENTESIS  02/04/2008   with penumonia   WISDOM TOOTH EXTRACTION     Social History   Tobacco Use   Smoking status: Former    Current packs/day: 0.00    Average packs/day: 0.5 packs/day for 10.0 years (5.0 ttl pk-yrs)    Types: Cigarettes    Start date: 05/05/1999    Quit date: 05/04/2009    Years since quitting: 14.2   Smokeless tobacco: Never   Tobacco comments:    Socially x10 years (1 pack/month)  Vaping Use   Vaping status: Never Used  Substance Use Topics   Alcohol use: Yes    Comment: rare   Drug use: No   Family History  Problem Relation Age of Onset   Lupus Sister    Obesity Mother    Hypertension Father    Hyperlipidemia Father    Obesity Father    Leukemia Sister    Diabetes Other        GM   Colon cancer Neg Hx    Esophageal cancer Neg Hx    Pancreatic cancer Neg Hx    Allergies  Allergen Reactions   Lisinopril  Cough   Lovenox  [Enoxaparin  Sodium] Itching   Moxifloxacin Other (See Comments)    REACTION: hallucinations   Lovenox  [Enoxaparin ]     Itching    Current Outpatient Medications on File Prior to Visit  Medication Sig Dispense Refill   amoxicillin  (AMOXIL ) 500 MG tablet TAKE 4 TABS (2000 MG) 30-60 MIN BEFORE DENTAL PROCEDURE. 4 tablet 2   aspirin  EC 81 MG EC tablet Take 1 tablet (81 mg total) by mouth daily. Swallow whole. 30 tablet 11   bismuth  subsalicylate (PEPTO BISMOL) 262 MG/15ML suspension Take 30 mLs by mouth every 6 (six) hours as needed for indigestion or diarrhea or loose stools.     calcium  carbonate (TUMS - DOSED IN MG ELEMENTAL CALCIUM ) 500 MG chewable tablet Chew 1-2 tablets by mouth 3 (three) times daily as needed for  indigestion or heartburn.     cyanocobalamin  (VITAMIN B12) 1000 MCG/ML injection Inject 1 mL (1,000 mcg total) into the muscle every 6 (six) weeks. 1 mL 2   cyclobenzaprine  (FLEXERIL ) 10 MG tablet Take 1 tablet (10 mg total) by mouth 3 (three) times daily as needed. 15 tablet 0   ezetimibe  (ZETIA ) 10 MG tablet TAKE 1 TABLET  BY MOUTH EVERY DAY 90 tablet 0   furosemide  (LASIX ) 20 MG tablet Take 20 mg by mouth daily.     hydroxychloroquine  (PLAQUENIL ) 200 MG tablet Take 200 mg by mouth 2 (two) times daily.     levothyroxine  (SYNTHROID ) 175 MCG tablet TAKE 1 TABLET BY MOUTH DAILY BEFORE BREAKFAST. 90 tablet 0   metoprolol  tartrate (LOPRESSOR ) 50 MG tablet TAKE 1 TABLET BY MOUTH TWICE A DAY 60 tablet 1   Multiple Vitamins-Minerals (BARIATRIC MULTIVITAMINS/IRON ) CAPS Take 1 tablet by mouth daily.     omeprazole  (PRILOSEC) 40 MG capsule Take 1 capsule (40 mg total) by mouth daily. 90 capsule 3   PARoxetine  (PAXIL ) 40 MG tablet TAKE 1 TABLET BY MOUTH EVERY DAY IN THE MORNING 90 tablet 0   rosuvastatin  (CRESTOR ) 40 MG tablet TAKE 1 TABLET BY MOUTH EVERY DAY 90 tablet 3   warfarin (COUMADIN ) 7.5 MG tablet TAKE 1 1/2 TABLETS (11.25MG ) BY MOUTH DAILY EXCEPT TAKE 2 TABLETS (15 MG) ON MONDAY AND FRIDAY OR AS DIRECTED BY ANTICOAGULATION CLINIC 135 tablet 1   No current facility-administered medications on file prior to visit.    Review of Systems  Constitutional:  Positive for fatigue. Negative for activity change, appetite change, fever and unexpected weight change.  HENT:  Negative for congestion, ear pain, rhinorrhea, sinus pressure and sore throat.   Eyes:  Negative for pain, redness and visual disturbance.  Respiratory:  Positive for cough and wheezing. Negative for shortness of breath.   Cardiovascular:  Negative for chest pain and palpitations.  Gastrointestinal:  Negative for abdominal pain, blood in stool, constipation and diarrhea.  Endocrine: Negative for polydipsia and polyuria.  Genitourinary:   Negative for dysuria, frequency and urgency.  Musculoskeletal:  Negative for arthralgias, back pain and myalgias.  Skin:  Negative for pallor and rash.  Allergic/Immunologic: Negative for environmental allergies.  Neurological:  Negative for dizziness, syncope and headaches.  Hematological:  Negative for adenopathy. Does not bruise/bleed easily.  Psychiatric/Behavioral:  Negative for decreased concentration and dysphoric mood. The patient is not nervous/anxious.        Objective:   Physical Exam Constitutional:      General: She is not in acute distress.    Appearance: Normal appearance. She is well-developed. She is obese. She is not diaphoretic.  HENT:     Head: Normocephalic and atraumatic.     Right Ear: Tympanic membrane and ear canal normal.     Left Ear: Tympanic membrane and ear canal normal.     Nose: Nose normal.     Mouth/Throat:     Mouth: Mucous membranes are moist.     Pharynx: Oropharynx is clear. No oropharyngeal exudate or posterior oropharyngeal erythema.   Eyes:     General:        Right eye: No discharge.        Left eye: No discharge.     Conjunctiva/sclera: Conjunctivae normal.     Pupils: Pupils are equal, round, and reactive to light.   Neck:     Thyroid : No thyromegaly.     Vascular: No carotid bruit or JVD.   Cardiovascular:     Rate and Rhythm: Normal rate and regular rhythm.     Heart sounds: Normal heart sounds.     No gallop.  Pulmonary:     Effort: Pulmonary effort is normal. No respiratory distress.     Breath sounds: Normal breath sounds. No stridor. No wheezing or rales.     Comments: Harsh bs  Mild exp wheeze Not shortness of breath with speech or ambulation   No rales or crackles  Abdominal:     General: There is no distension or abdominal bruit.     Palpations: Abdomen is soft.   Musculoskeletal:     Cervical back: Normal range of motion and neck supple.     Right lower leg: No edema.     Left lower leg: No edema.   Lymphadenopathy:     Cervical: No cervical adenopathy.   Skin:    General: Skin is warm and dry.     Coloration: Skin is not pale.     Findings: No rash.   Neurological:     Mental Status: She is alert.     Coordination: Coordination normal.     Deep Tendon Reflexes: Reflexes are normal and symmetric. Reflexes normal.   Psychiatric:        Mood and Affect: Mood normal.           Assessment & Plan:   Problem List Items Addressed This Visit       Other   Hemoptysis   With cough in pt who takes warfarin  Lab Results  Component Value Date   INR 1.7 (A) 07/29/2023   INR 2.4 (H) 07/15/2023   INR 4.8 (A) 07/09/2023   PROTIME 15.6 (H) 12/06/2012   PROTIME 19.2 (H) 06/03/2012    See a/p for cough  Cxr ordered       Relevant Orders   DG Chest 2 View   Cough - Primary   Mild /productive with hemoptysis (on warfarin with INR of 1.7 yesterday) Had pna last mo treatment with augmentin  and azithro and steroid (Reviewed hospital records, lab results and studies in detail  from ER)  Today pulse ox 91% RA Not shortness of breath Mild wheeze on exam  Cxr ordered   Sent Prednisone  30 mg taper  Tessalon  200 mg for cough  Refilled albuterol  mdi        Relevant Orders   POC COVID-19 (Completed)   DG Chest 2 View   Other Visit Diagnoses       Upper respiratory tract infection, unspecified type       Relevant Medications   albuterol  (VENTOLIN  HFA) 108 (90 Base) MCG/ACT inhaler

## 2023-07-30 NOTE — Assessment & Plan Note (Signed)
 With cough in pt who takes warfarin  Lab Results  Component Value Date   INR 1.7 (A) 07/29/2023   INR 2.4 (H) 07/15/2023   INR 4.8 (A) 07/09/2023   PROTIME 15.6 (H) 12/06/2012   PROTIME 19.2 (H) 06/03/2012    See a/p for cough  Cxr ordered

## 2023-07-30 NOTE — Assessment & Plan Note (Addendum)
 Mild /productive with hemoptysis (on warfarin with INR of 1.7 yesterday) Had pna last mo treatment with augmentin  and azithro and steroid (Reviewed hospital records, lab results and studies in detail  from ER)  Today pulse ox 91% RA Not shortness of breath Mild wheeze on exam  Cxr ordered : notes stable calcified left pleural fluid collection , minimal atelectasis and no new infiltrate  Overall reassuring   Sent Prednisone  30 mg taper  Tessalon  200 mg for cough  Refilled albuterol  mdi

## 2023-07-30 NOTE — Patient Instructions (Addendum)
 Chest xray now   We will reach out with results later and make a plan  Sending prednisone  now Try the benzonatate  for cough  Use inhaler as needed   If suddenly worse go to the ER   Watch for fever / shortness of breath / worsening

## 2023-08-03 NOTE — Telephone Encounter (Signed)
 Please see if patient would qualify for any further /continuation of rehab.

## 2023-08-04 ENCOUNTER — Telehealth: Payer: Self-pay

## 2023-08-04 NOTE — Telephone Encounter (Signed)
 Pt tests INR at home and she is due to test. LVM reminding pt it is time to test.

## 2023-08-04 NOTE — Telephone Encounter (Signed)
 I would put in referral for pulmonary rehab  Dx OSA Pt could also consider [facility] after that

## 2023-08-05 NOTE — Telephone Encounter (Signed)
 Left another VM requesting pt to test.

## 2023-08-06 ENCOUNTER — Ambulatory Visit (INDEPENDENT_AMBULATORY_CARE_PROVIDER_SITE_OTHER): Payer: Self-pay

## 2023-08-06 DIAGNOSIS — Z7901 Long term (current) use of anticoagulants: Secondary | ICD-10-CM

## 2023-08-06 LAB — POCT INR: INR: 3.1 — AB (ref 2.0–3.0)

## 2023-08-06 NOTE — Patient Instructions (Addendum)
 Pre visit review using our clinic review tool, if applicable. No additional management support is needed unless otherwise documented below in the visit note.  Continue 1 1/2 tablet daily except take 2 tablets on Monday and Thursday. Recheck on 7/15.

## 2023-08-06 NOTE — Telephone Encounter (Signed)
 LVM to test INR. Advised coumadin  clinic nurse will be out of the office after 12 today and will not return until 7/9. Advised to test this morning or wait until 7/9.

## 2023-08-06 NOTE — Progress Notes (Signed)
 Pt is currently taking prednisone  and will finish in 4 days. Continue 1 1/2 tablet daily except take 2 tablets on Monday and Thursday. Recheck on 7/15.  Advised pt of dose change and recheck date. Pt verbalized understanding.

## 2023-08-11 LAB — LAB REPORT - SCANNED: EGFR: 61

## 2023-08-12 ENCOUNTER — Ambulatory Visit: Payer: Self-pay

## 2023-08-17 ENCOUNTER — Telehealth (HOSPITAL_COMMUNITY): Payer: Self-pay

## 2023-08-17 NOTE — Telephone Encounter (Signed)
 Received referral from Dr. Vina Gull for this pt to participate in Pulmonary Rehab with the diagnosis of OSA. Clinical review of pt follow up appt on 08/12/23 Pulmonary office note. Pt appropriate for scheduling for Pulmonary rehab.   LVM for pt to see if she was interested in scheduling.   Ronal Levin, RN, BSN Cardiac and Pulmonary Rehab

## 2023-08-19 ENCOUNTER — Telehealth: Payer: Self-pay

## 2023-08-19 NOTE — Telephone Encounter (Signed)
 Time for pt to test INR at home. LVM

## 2023-08-20 ENCOUNTER — Ambulatory Visit (INDEPENDENT_AMBULATORY_CARE_PROVIDER_SITE_OTHER): Payer: Self-pay

## 2023-08-20 DIAGNOSIS — Z7901 Long term (current) use of anticoagulants: Secondary | ICD-10-CM

## 2023-08-20 LAB — POCT INR: INR: 2.9 (ref 2.0–3.0)

## 2023-08-20 NOTE — Progress Notes (Signed)
 Continue 1 1/2 tablet daily except take 2 tablets on Monday and Thursday. Recheck on 7/29.  Advised pt of dose change and recheck date. Pt verbalized understanding.

## 2023-08-20 NOTE — Patient Instructions (Addendum)
 Pre visit review using our clinic review tool, if applicable. No additional management support is needed unless otherwise documented below in the visit note.  Continue 1 1/2 tablet daily except take 2 tablets on Monday and Thursday. Recheck on 7/29.

## 2023-08-20 NOTE — Telephone Encounter (Signed)
 Left another VM to test.

## 2023-08-21 ENCOUNTER — Encounter: Payer: Self-pay | Admitting: Family Medicine

## 2023-08-26 ENCOUNTER — Telehealth (HOSPITAL_COMMUNITY): Payer: Self-pay

## 2023-08-26 NOTE — Telephone Encounter (Signed)
 Ronal attempted to call patient on 7/14- no answer, left message.  F/u call on 7/23- no answer, left message.

## 2023-09-01 ENCOUNTER — Telehealth (HOSPITAL_COMMUNITY): Payer: Self-pay

## 2023-09-01 NOTE — Telephone Encounter (Signed)
 Attempted f/u call for pulmonary rehab on 7/29- no answer, left message informing patient we will be closing her referral and to call us  if she would like it reopened or has any questions. Sent MyChart message.  Closing referral.

## 2023-09-02 ENCOUNTER — Ambulatory Visit (INDEPENDENT_AMBULATORY_CARE_PROVIDER_SITE_OTHER): Admitting: Family Medicine

## 2023-09-02 ENCOUNTER — Encounter: Payer: Self-pay | Admitting: Family Medicine

## 2023-09-02 ENCOUNTER — Telehealth: Payer: Self-pay

## 2023-09-02 ENCOUNTER — Telehealth (HOSPITAL_COMMUNITY): Payer: Self-pay

## 2023-09-02 DIAGNOSIS — G4733 Obstructive sleep apnea (adult) (pediatric): Secondary | ICD-10-CM | POA: Diagnosis not present

## 2023-09-02 DIAGNOSIS — Z9884 Bariatric surgery status: Secondary | ICD-10-CM

## 2023-09-02 DIAGNOSIS — R7303 Prediabetes: Secondary | ICD-10-CM

## 2023-09-02 DIAGNOSIS — I10 Essential (primary) hypertension: Secondary | ICD-10-CM

## 2023-09-02 MED ORDER — TIRZEPATIDE-WEIGHT MANAGEMENT 2.5 MG/0.5ML ~~LOC~~ SOAJ: 2.5000 mg | SUBCUTANEOUS | 0 refills | Status: DC

## 2023-09-02 MED ORDER — TIRZEPATIDE-WEIGHT MANAGEMENT 2.5 MG/0.5ML ~~LOC~~ SOLN: 2.5000 mg | SUBCUTANEOUS | 0 refills | Status: DC

## 2023-09-02 NOTE — Assessment & Plan Note (Signed)
 Lab Results  Component Value Date   HGBA1C 6.3 (H) 07/15/2023   HGBA1C 6.4 01/15/2023   HGBA1C 6.3 10/16/2021   disc imp of low glycemic diet and wt loss to prevent DM2

## 2023-09-02 NOTE — Telephone Encounter (Signed)
 Contacted pt to advise it is time to test INR. Pt said she would test later today.

## 2023-09-02 NOTE — Assessment & Plan Note (Addendum)
 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 (has under control) , takes omeprazole  40 for GERD also , well controlled  No personal or fam history of medullary thyroid  ca or MEN   Interested to try GLP-1 Disc option of GLP medication including possible side effects like GI intolerance and risk of thyroid  and endocrine cancer, pancreatitis and gallstones, kidney problems and diabetic retinopathy  Pt understands good protein intake and strength building exercise is important   Sent tirzepatide  2.5 mg pen -weekly  If covered -will call us  before starting and arrange follow up  35 minutes were spent today both face to face and in the chart obtaining history, reviewing records (nephrology)and test results, performing exam , educating and discussing treatment options, and placing orders

## 2023-09-02 NOTE — Patient Instructions (Addendum)
 I am going to start by sending the generic for zepbound  (tirzepatide ) to your pharmacy to see if it gets approved  If we need to change to a different brand that is fine  If covered and you can afford then get it filled and let us  know before you start it   Watch for  Abdominal symptoms like nausea/ vomiting / stool changes / pain  Any intolerable side effects-hold medicine and let us  know

## 2023-09-02 NOTE — Assessment & Plan Note (Signed)
 bp in fair control at this time  BP Readings from Last 1 Encounters:  09/02/23 126/72   No changes needed Most recent labs reviewed  Disc lifstyle change with low sodium diet and exercise  metoprool 50 mg bid  Lasix  20 mg daily  Reviewed recent nephrology note   Weight loss would help

## 2023-09-02 NOTE — Telephone Encounter (Signed)
 Patient called back to have referral reopened. Informed patient we will have pulmonary nurse confirm she is cleared for rehab and verify her insurance before calling her back to schedule.

## 2023-09-02 NOTE — Assessment & Plan Note (Signed)
 Using cpap  Noted weight loss would help  Will see if we can get GLP-1 med covered

## 2023-09-02 NOTE — Progress Notes (Signed)
 Subjective:    Patient ID: [name], female    DOB: Apr 29, 1965, 58 y.o.   MRN: [id number]  HPI  Wt Readings from Last 3 Encounters:  09/02/23 (!) 368 lb (166.9 kg)  07/30/23 (!) 360 lb 6 oz (163.5 kg)  07/15/23 (!) 360 lb (163.3 kg)   61.24 kg/m  Vitals:   09/02/23 1100  BP: 126/72  Pulse: 69  Temp: 98.5 F (36.9 C)  SpO2: 92%    Pt presents for follow up of chronic medical problems including HTN Prediabetes  Obesity   Per pt, nephrologist Dr Maryjane recommended we discuss weight loss medicine options    Called her ins co- ? 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- avoids certain foods for that and it is much better)- avoids rice/popcorn/ chewy meats  Not needing tums as much as she used to  Takes omeprazole  40 mg daily   Exercise Started doing chair yoga and it has helped   Tends to get diarrhea easily   Knees really bother her  Mobility is issue     Lab Results  Component Value Date   NA 139 07/01/2023   K 4.5 07/01/2023   CO2 29 07/01/2023   GLUCOSE 136 (H) 07/01/2023   BUN 7 07/01/2023   CREATININE 0.98 07/01/2023   CALCIUM  9.2 07/01/2023   GFR 60.11 01/15/2023   EGFR 61.0 08/11/2023   GFRNONAA >60 07/01/2023   Lab Results  Component Value Date   ALT 15 01/15/2023   AST 21 01/15/2023   ALKPHOS 74 01/15/2023   BILITOT 0.5 01/15/2023   Lab Results  Component Value Date   HGBA1C 6.3 (H) 07/15/2023   HGBA1C 6.4 01/15/2023   HGBA1C 6.3 10/16/2021   Lab Results  Component Value Date   WBC 5.6 07/15/2023   HGB 12.4 07/15/2023   HCT 41.5 07/15/2023   MCV 85.9 07/15/2023   PLT 204 07/15/2023   Lab Results  Component Value Date    TSH 2.48 01/15/2023   Lab Results  Component Value Date   IRON  69 01/15/2023   TIBC 377 09/30/2021   FERRITIN 58 09/30/2021   Sees hematology for iron  def   Lab Results  Component Value Date   VITAMINB12 943 (H) 01/15/2023   Monthly shots    Patient Active Problem List   Diagnosis Date Noted   Cough 07/30/2023   Hemoptysis 07/30/2023   History of pneumonia 07/15/2023   Thrombocytopenia (HCC) 07/15/2023   Blister of right lower extremity 07/15/2023   Encounter for hepatitis C screening test for low risk patient 01/15/2023   Globus sensation 10/23/2021   History of bariatric surgery 01/08/2021   IDA (iron  deficiency anemia) 06/28/2020   Iron  deficiency anemia 06/18/2020   History of atrial fibrillation 06/18/2020   S/P aortic valve replacement with bioprosthetic valve 04/06/2020   Coronary artery disease 04/06/2020   Chest pain of uncertain etiology 01/17/2020   Esophageal diverticulum    Esophageal dysphagia    Fatigue 05/20/2019   Systemic lupus erythematosus with lung involvement (HCC) 01/04/2019   Aortic stenosis 08/15/2017   Vitamin B12 deficiency 11/15/2015   Allergic rhinitis 07/01/2015  History of cerebral hemorrhage 07/01/2015   Chronic daily headache 06/29/2015   Encounter for therapeutic drug monitoring 03/25/2013   CKD (chronic kidney disease) stage 2, GFR 60-89 ml/min 12/20/2012   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   Routine general medical examination at a health care facility 12/07/2012   History of HPV infection 12/07/2012   Long term (current) use of anticoagulants 10/06/2012   Chronic anticoagulation 04/22/2012   S/P bariatric surgery 04/22/2012   Homozygous Factor V Leiden mutation (HCC) 04/22/2012   OSA on CPAP 11/20/2011   Prediabetes 09/16/2010   EDEMA 01/08/2010   Pleural effusion 08/27/2009   Enlarged lymph nodes 08/13/2009   History of pulmonary embolism 05/24/2009   Morbid obesity (HCC) 12/08/2007   Hypothyroidism 06/09/2006    HYPERCHOLESTEROLEMIA 06/09/2006   Generalized anxiety disorder 06/09/2006   PANIC DISORDER 06/09/2006   Essential hypertension 06/09/2006   Nephritis and nephropathy, with pathological lesion in kidney 06/09/2006   Systemic lupus erythematosus (HCC) 06/09/2006   DVT, HX OF 06/09/2006   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    Loculated-chronic on Left-VanTright; s/p VATS 5/10   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   HLD (hyperlipidemia)    Iron  deficiency anemia    IV dextran [place]   Nephritis and nephropathy, not specified as acute or chronic, with unspecified pathological lesion in kidney    Nonspecific abnormal results of liver function study    Obesity, unspecified    Other specified acquired hypothyroidism    Panic disorder without agoraphobia    Personal history of venous thrombosis and embolism 2002   during pregnancy; ?factor 5 leiden (sees heme)   Pleural effusion 2005   c/w lupus initial w/u; recurrent Right as pred tapered off July 2011   Sleep apnea    Systemic lupus erythematosus (HCC)    renal GN, hx of pericardial eff in late 90's   Unspecified essential hypertension    Past Surgical History:  Procedure Laterality Date   AORTIC VALVE REPLACEMENT N/A 04/06/2020   Procedure: AORTIC VALVE REPLACEMENT (AVR) USING INSPIRIS 21 MM AORTIC VALVE;  Surgeon: Lucas Dorise POUR, MD;  Location: [facility] OR;  Service: Open Heart Surgery;  Laterality: N/A;   BIOPSY  04/12/2019   Procedure: BIOPSY;  Surgeon: Eda Iha, MD;  Location: [facility];  Service: Gastroenterology;;   CARDIAC CATHETERIZATION     CARDIAC VALVE REPLACEMENT  [phone]    ESOPHAGEAL MANOMETRY N/A 07/15/2019   Procedure: ESOPHAGEAL MANOMETRY (EM);  Surgeon: Shila Gustav GAILS, MD;  Location: [facility];  Service: Endoscopy;  Laterality: N/A;   ESOPHAGOGASTRODUODENOSCOPY (EGD) WITH PROPOFOL  N/A 04/12/2019   Procedure: ESOPHAGOGASTRODUODENOSCOPY (EGD) WITH PROPOFOL ;  Surgeon: Eda Iha, MD;  Location: [facility];  Service: Gastroenterology;  Laterality: N/A;   gastric sleeve  09/04/2010   bariatric surgery Dr Dela    LAPAROSCOPIC TUBAL LIGATION  12/09/2010   Procedure: LAPAROSCOPIC TUBAL LIGATION;  Surgeon: Norleen GORMAN Skill;  Location: [facility];  Service: Gynecology;  Laterality: Bilateral;  Attempted see Nursing note   pleurocentesis  10/04/2004   Pleuryx catheter placement  10/04/2009  VanTright----removed 9/11   RENAL BIOPSY  09/24/2012   RIGHT/LEFT HEART CATH AND CORONARY ANGIOGRAPHY N/A 01/19/2020   Procedure: RIGHT/LEFT HEART CATH AND CORONARY ANGIOGRAPHY;  Surgeon: Court Dorn PARAS, MD;  Location: [facility];  Service: Cardiovascular;  Laterality: N/A;   svd     x 3   TEE WITHOUT CARDIOVERSION N/A 04/06/2020   Procedure: TRANSESOPHAGEAL ECHOCARDIOGRAM (TEE);  Surgeon: Lucas Dorise POUR, MD;  Location: [facility] OR;  Service: Open Heart Surgery;  Laterality: N/A;   THORACENTESIS  02/04/2008   with penumonia   WISDOM TOOTH EXTRACTION     Social History   Tobacco Use   Smoking status: Former    Current packs/day: 0.00    Average packs/day: 0.5 packs/day for 10.0 years (5.0 ttl pk-yrs)    Types: Cigarettes    Start date: 05/05/1999    Quit date: 05/04/2009    Years since quitting: 14.3   Smokeless tobacco: Never   Tobacco comments:    Socially x10 years (1 pack/month)  Vaping Use   Vaping status: Never Used  Substance Use Topics   Alcohol use: Yes    Comment: rare   Drug use: No   Family History  Problem Relation Age of Onset   Lupus Sister    Obesity Mother    Hypertension Father    Hyperlipidemia Father    Obesity Father     Leukemia Sister    Diabetes Other        GM   Colon cancer Neg Hx    Esophageal cancer Neg Hx    Pancreatic cancer Neg Hx    Allergies  Allergen Reactions   Lisinopril  Cough   Lovenox  [Enoxaparin  Sodium] Itching   Moxifloxacin Other (See Comments)    REACTION: hallucinations   Lovenox  [Enoxaparin ]     Itching    Current Outpatient Medications on File Prior to Visit  Medication Sig Dispense Refill   albuterol  (VENTOLIN  HFA) 108 (90 Base) MCG/ACT inhaler Inhale 1-2 puffs into the lungs every 6 (six) hours as needed for wheezing or shortness of breath. 8.5 g 3   amoxicillin  (AMOXIL ) 500 MG tablet TAKE 4 TABS (2000 MG) 30-60 MIN BEFORE DENTAL PROCEDURE. 4 tablet 2   aspirin  EC 81 MG EC tablet Take 1 tablet (81 mg total) by mouth daily. Swallow whole. 30 tablet 11   benzonatate  (TESSALON ) 200 MG capsule Take 1 capsule (200 mg total) by mouth 3 (three) times daily as needed. Swallow whole 30 capsule 1   bismuth  subsalicylate (PEPTO BISMOL) 262 MG/15ML suspension Take 30 mLs by mouth every 6 (six) hours as needed for indigestion or diarrhea or loose stools.     calcium  carbonate (TUMS - DOSED IN MG ELEMENTAL CALCIUM ) 500 MG chewable tablet Chew 1-2 tablets by mouth 3 (three) times daily as needed for indigestion or heartburn.     cyanocobalamin  (VITAMIN B12) 1000 MCG/ML injection Inject 1 mL (1,000 mcg total) into the muscle every 6 (six) weeks. 1 mL 2   cyclobenzaprine  (FLEXERIL ) 10 MG tablet Take 1 tablet (10 mg total) by mouth 3 (three) times daily as needed. 15 tablet 0   ezetimibe  (ZETIA ) 10 MG tablet TAKE 1 TABLET BY MOUTH EVERY DAY 90 tablet 0   furosemide  (LASIX ) 20 MG tablet Take 20 mg by mouth daily.     hydroxychloroquine  (PLAQUENIL ) 200 MG tablet Take 200 mg by mouth 2 (two) times daily.     levothyroxine  (SYNTHROID ) 175 MCG tablet TAKE 1 TABLET BY MOUTH DAILY BEFORE  BREAKFAST. 90 tablet 0   metoprolol  tartrate (LOPRESSOR ) 50 MG tablet TAKE 1 TABLET BY MOUTH TWICE A DAY 60 tablet 1    Multiple Vitamins-Minerals (BARIATRIC MULTIVITAMINS/IRON ) CAPS Take 1 tablet by mouth daily.     omeprazole  (PRILOSEC) 40 MG capsule Take 1 capsule (40 mg total) by mouth daily. 90 capsule 3   PARoxetine  (PAXIL ) 40 MG tablet TAKE 1 TABLET BY MOUTH EVERY DAY IN THE MORNING 90 tablet 0   rosuvastatin  (CRESTOR ) 40 MG tablet TAKE 1 TABLET BY MOUTH EVERY DAY 90 tablet 3   warfarin (COUMADIN ) 7.5 MG tablet TAKE 1 1/2 TABLETS (11.25MG ) BY MOUTH DAILY EXCEPT TAKE 2 TABLETS (15 MG) ON MONDAY AND FRIDAY OR AS DIRECTED BY ANTICOAGULATION CLINIC 135 tablet 1   No current facility-administered medications on file prior to visit.    Review of Systems  Constitutional:  Positive for fatigue. Negative for activity change, appetite change, fever and unexpected weight change.  HENT:  Negative for congestion, ear pain, rhinorrhea, sinus pressure and sore throat.   Eyes:  Negative for pain, redness and visual disturbance.  Respiratory:  Negative for cough, shortness of breath and wheezing.        Some exercise intolerance with obesity and former lung problems   Cardiovascular:  Negative for chest pain and palpitations.  Gastrointestinal:  Negative for abdominal pain, blood in stool, constipation and diarrhea.  Endocrine: Negative for polydipsia and polyuria.  Genitourinary:  Negative for dysuria, frequency and urgency.  Musculoskeletal:  Positive for arthralgias. Negative for back pain and myalgias.       Mobility issues due to weight and knee pain   Skin:  Negative for pallor and rash.  Allergic/Immunologic: Negative for environmental allergies.  Neurological:  Negative for dizziness, syncope and headaches.  Hematological:  Negative for adenopathy. Does not bruise/bleed easily.  Psychiatric/Behavioral:  Negative for decreased concentration and dysphoric mood. The patient is not nervous/anxious.        Objective:   Physical Exam Constitutional:      General: She is not in acute distress.     Appearance: Normal appearance. She is obese. She is not ill-appearing.  Eyes:     Conjunctiva/sclera: Conjunctivae normal.     Pupils: Pupils are equal, round, and reactive to light.  Cardiovascular:     Rate and Rhythm: Normal rate and regular rhythm.  Pulmonary:     Effort: Pulmonary effort is normal. No respiratory distress.  Musculoskeletal:     Right lower leg: Edema present.     Left lower leg: Edema present.     Comments: Pain in knees with ambulation   Neurological:     Mental Status: She is alert.  Psychiatric:        Mood and Affect: Mood normal.           Assessment & Plan:   Problem List Items Addressed This Visit       Cardiovascular and Mediastinum   Essential hypertension   bp in fair control at this time  BP Readings from Last 1 Encounters:  09/02/23 126/72   No changes needed Most recent labs reviewed  Disc lifstyle change with low sodium diet and exercise  metoprool 50 mg bid  Lasix  20 mg daily  Reviewed recent nephrology note   Weight loss would help        Respiratory   OSA on CPAP   Using cpap  Noted weight loss would help  Will see if we can get GLP-1 med covered  Other   Prediabetes   Lab Results  Component Value Date   HGBA1C 6.3 (H) 07/15/2023   HGBA1C 6.4 01/15/2023   HGBA1C 6.3 10/16/2021   disc imp of low glycemic diet and wt loss to prevent DM2        Morbid obesity (HCC) - Primary   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 (has under control) , takes omeprazole  40 for GERD also , well controlled  No personal or fam history of medullary thyroid  ca or MEN    Interested to try GLP-1 Disc option of GLP medication including possible side effects like GI intolerance and risk of thyroid  and endocrine cancer, pancreatitis and gallstones, kidney problems and diabetic retinopathy  Pt understands good protein intake and strength building exercise is important   Sent tirzepatide  2.5 mg pen -weekly  If covered -will call us  before starting and arrange follow up  35 minutes were spent today both face to face and in the chart obtaining history, reviewing records (nephrology)and test results, performing exam , educating and discussing treatment options, and placing orders           Relevant Medications   tirzepatide  (ZEPBOUND ) 2.5 MG/0.5ML Pen   History of bariatric surgery   Lost and re gained weight  Gastric sleeve Interested in trying glp-1 if covered and tolerated

## 2023-09-02 NOTE — Assessment & Plan Note (Signed)
 Lost and re gained weight  Gastric sleeve Interested in trying glp-1 if covered and tolerated

## 2023-09-04 ENCOUNTER — Telehealth (HOSPITAL_COMMUNITY): Payer: Self-pay

## 2023-09-04 ENCOUNTER — Ambulatory Visit (INDEPENDENT_AMBULATORY_CARE_PROVIDER_SITE_OTHER): Payer: Self-pay

## 2023-09-04 DIAGNOSIS — Z7901 Long term (current) use of anticoagulants: Secondary | ICD-10-CM

## 2023-09-04 LAB — POCT INR: INR: 3.2 — AB (ref 2.0–3.0)

## 2023-09-04 NOTE — Telephone Encounter (Signed)
 Pt insurance is active and benefits verified through [employer]. Co-pay $0, DED $1,800/$1,800 met, out of pocket $3,500/$3,037.49 met, co-insurance 20%. No pre-authorization required. 09/04/2023 @ 11:00am, spoke with May, REF# [id number].

## 2023-09-04 NOTE — Patient Instructions (Addendum)
 Pre visit review using our clinic review tool, if applicable. No additional management support is needed unless otherwise documented below in the visit note.  Continue 1 1/2 tablet daily except take 2 tablets on Monday and Thursday. Recheck on 8/18.

## 2023-09-04 NOTE — Telephone Encounter (Signed)
 Called patient to see if she was interested in participating in the Pulmonary Rehab Program. Patient will come in for orientation on 8/20 and will attend the 1:15 exercise class.  Sent MyChart message.

## 2023-09-04 NOTE — Progress Notes (Cosign Needed Addendum)
 Continue 1 1/2 tablet daily except take 2 tablets on Monday and Thursday. Recheck on 8/18.  Advised pt of dose change and recheck date. Pt verbalized understanding.   Medical screening examination/treatment/procedure(s) were performed by non-physician practitioner and as supervising physician I was immediately available for consultation/collaboration.  I agree with above. Karlynn Noel, MD

## 2023-09-09 ENCOUNTER — Other Ambulatory Visit: Payer: Self-pay | Admitting: Internal Medicine

## 2023-09-16 ENCOUNTER — Other Ambulatory Visit: Payer: Self-pay | Admitting: Family Medicine

## 2023-09-22 ENCOUNTER — Telehealth (HOSPITAL_COMMUNITY): Payer: Self-pay

## 2023-09-22 ENCOUNTER — Telehealth: Payer: Self-pay

## 2023-09-22 NOTE — Telephone Encounter (Signed)
 Pt tests INR at home. LVM reminding pt it is time to test.

## 2023-09-22 NOTE — Telephone Encounter (Signed)
 Called to confirm appt. Pt confirmed appt. Instructed pt on proper footwear. Gave directions along with department number.

## 2023-09-23 ENCOUNTER — Encounter (HOSPITAL_COMMUNITY)
Admission: RE | Admit: 2023-09-23 | Discharge: 2023-09-23 | Disposition: A | Discharge status: Home / Self Care | Source: Ambulatory Visit | Attending: Internal Medicine | Admitting: Internal Medicine

## 2023-09-23 ENCOUNTER — Telehealth: Payer: Self-pay | Admitting: Internal Medicine

## 2023-09-23 ENCOUNTER — Encounter (HOSPITAL_COMMUNITY): Payer: Self-pay

## 2023-09-23 VITALS — BP 144/64 | Wt 370.8 lb

## 2023-09-23 DIAGNOSIS — R0600 Dyspnea, unspecified: Secondary | ICD-10-CM

## 2023-09-23 DIAGNOSIS — G4733 Obstructive sleep apnea (adult) (pediatric): Secondary | ICD-10-CM

## 2023-09-23 DIAGNOSIS — R0902 Hypoxemia: Secondary | ICD-10-CM

## 2023-09-23 NOTE — Telephone Encounter (Signed)
 Left another VM reminding pt it is time to test INR.

## 2023-09-23 NOTE — Progress Notes (Signed)
   09/23/23 1411  6 Minute Walk  Phase Initial  Distance 618 feet  Walk Time 6 minutes  # of Rest Breaks 2 (2:50-3:13; 4:00-4:20)  MPH 1.17  METS 0.58  RPE 11  Perceived Dyspnea  1  VO2 Peak 2.03  Symptoms No  Resting HR 68 bpm  Resting BP 144/64  Resting Oxygen Saturation  94 %  Exercise Oxygen Saturation  during 6 min walk 85 %  Max Ex. HR 85 bpm  Max Ex. BP 160/70  2 Minute Post BP 142/70  Interval HR  1 Minute HR 85  2 Minute HR 80  3 Minute HR 72  4 Minute HR 84  5 Minute HR 82  6 Minute HR 87  2 Minute Post HR 69  Interval Heart Rate? Yes  Interval Oxygen  Interval Oxygen? Yes  Baseline Oxygen Saturation % 94 %  1 Minute Oxygen Saturation % 92 %  1 Minute Liters of Oxygen 0 L  2 Minute Oxygen Saturation % 93 %  2 Minute Liters of Oxygen 0 L  3 Minute Oxygen Saturation % 90 %  3 Minute Liters of Oxygen 0 L  4 Minute Oxygen Saturation % 85 %  4 Minute Liters of Oxygen 0 L (placed on 2L)  5 Minute Oxygen Saturation % 93 %  5 Minute Liters of Oxygen 2 L  6 Minute Oxygen Saturation % 91 %  6 Minute Liters of Oxygen 2 L  2 Minute Post Oxygen Saturation % 98 %  2 Minute Post Liters of Oxygen 2 L

## 2023-09-23 NOTE — Progress Notes (Signed)
 Pulmonary Individual Treatment Plan  Patient Details  Name: [name] MRN: [id number] Date of Birth: [birth year] Referring Provider:   Conrad Ports Pulmonary Rehab Walk Test from 09/23/2023 in [facility]  Referring Provider Dr. Okey    Initial Encounter Date:  Flowsheet Row Pulmonary Rehab Walk Test from 09/23/2023 in [facility]  Date 09/23/23    Visit Diagnosis: OSA (obstructive sleep apnea)  Patient's Home Medications on Admission:   Current Outpatient Medications:    albuterol  (VENTOLIN  HFA) 108 (90 Base) MCG/ACT inhaler, Inhale 1-2 puffs into the lungs every 6 (six) hours as needed for wheezing or shortness of breath., Disp: 8.5 g, Rfl: 3   amoxicillin  (AMOXIL ) 500 MG tablet, TAKE 4 TABS (2000 MG) 30-60 MIN BEFORE DENTAL PROCEDURE., Disp: 4 tablet, Rfl: 2   aspirin  EC 81 MG EC tablet, Take 1 tablet (81 mg total) by mouth daily. Swallow whole., Disp: 30 tablet, Rfl: 11   benzonatate  (TESSALON ) 200 MG capsule, Take 1 capsule (200 mg total) by mouth 3 (three) times daily as needed. Swallow whole, Disp: 30 capsule, Rfl: 1   bismuth  subsalicylate (PEPTO BISMOL) 262 MG/15ML suspension, Take 30 mLs by mouth every 6 (six) hours as needed for indigestion or diarrhea or loose stools., Disp: , Rfl:    calcium  carbonate (TUMS - DOSED IN MG ELEMENTAL CALCIUM ) 500 MG chewable tablet, Chew 1-2 tablets by mouth 3 (three) times daily as needed for indigestion or heartburn., Disp: , Rfl:    cyanocobalamin  (VITAMIN B12) 1000 MCG/ML injection, Inject 1 mL (1,000 mcg total) into the muscle every 6 (six) weeks., Disp: 1 mL, Rfl: 2   cyclobenzaprine  (FLEXERIL ) 10 MG tablet, Take 1 tablet (10 mg total) by mouth 3 (three) times daily as needed., Disp: 15 tablet, Rfl: 0   ezetimibe  (ZETIA ) 10 MG tablet, TAKE 1 TABLET BY MOUTH EVERY DAY, Disp: 90 tablet, Rfl: 0   furosemide  (LASIX ) 20 MG  tablet, Take 20 mg by mouth daily., Disp: , Rfl:    hydroxychloroquine  (PLAQUENIL ) 200 MG tablet, Take 200 mg by mouth 2 (two) times daily., Disp: , Rfl:    levothyroxine  (SYNTHROID ) 175 MCG tablet, TAKE 1 TABLET BY MOUTH DAILY BEFORE BREAKFAST., Disp: 90 tablet, Rfl: 1   metoprolol  tartrate (LOPRESSOR ) 50 MG tablet, TAKE 1 TABLET BY MOUTH TWICE A DAY, Disp: 60 tablet, Rfl: 0   Multiple Vitamins-Minerals (BARIATRIC MULTIVITAMINS/IRON ) CAPS, Take 1 tablet by mouth daily., Disp: , Rfl:    omeprazole  (PRILOSEC) 40 MG capsule, Take 1 capsule (40 mg total) by mouth daily., Disp: 90 capsule, Rfl: 3   PARoxetine  (PAXIL ) 40 MG tablet, TAKE 1 TABLET BY MOUTH EVERY DAY IN THE MORNING, Disp: 90 tablet, Rfl: 0   rosuvastatin  (CRESTOR ) 40 MG tablet, TAKE 1 TABLET BY MOUTH EVERY DAY, Disp: 90 tablet, Rfl: 3   warfarin (COUMADIN ) 7.5 MG tablet, TAKE 1 1/2 TABLETS (11.25MG ) BY MOUTH DAILY EXCEPT TAKE 2 TABLETS (15 MG) ON MONDAY AND FRIDAY OR AS DIRECTED BY ANTICOAGULATION CLINIC, Disp: 135 tablet, Rfl: 1   tirzepatide  (ZEPBOUND ) 2.5 MG/0.5ML Pen, Inject 2.5 mg into the skin once a week. 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   HLD (hyperlipidemia)    Iron  deficiency anemia    IV dextran [place]   Nephritis and nephropathy, not specified as acute or chronic, with unspecified pathological lesion in kidney    Nonspecific abnormal results of liver function study    Obesity, unspecified    Other  specified acquired hypothyroidism    Panic disorder without agoraphobia    Personal history of venous thrombosis and embolism 2002   during pregnancy; ?factor 5 leiden (sees heme)   Pleural effusion 2005   c/w lupus initial w/u; recurrent Right as pred tapered off July 2011   Sleep apnea    Systemic lupus erythematosus (HCC)    renal GN, hx of pericardial eff in late 90's   Unspecified essential hypertension     Tobacco Use: Social History   Tobacco Use  Smoking Status Former   Current packs/day: 0.00   Average packs/day: 0.5 packs/day for 10.0 years (5.0 ttl pk-yrs)   Types: Cigarettes   Start date: 05/05/1999   Quit date: 05/04/2009   Years since quitting: 14.3  Smokeless Tobacco Never  Tobacco Comments   Socially x10 years (1 pack/month)    Labs: Review Flowsheet  More data exists      Latest Ref Rng & Units 07/06/2020 09/07/2020 10/16/2021 01/15/2023 07/15/2023  Labs for ITP Cardiac and Pulmonary Rehab  Cholestrol 0 - 200 mg/dL 814  836  855  838  -  LDL (calc) 0 - 99 mg/dL 890  91  71  84  -  HDL-C >39.00 mg/dL 57  57  48.69  44.59  -  Trlycerides 0.0 - 149.0 mg/dL 894  79  892.9  890.9  -  Hemoglobin A1c <5.7 % - - 6.3  6.4  6.3     Capillary Blood Glucose: Lab Results  Component Value Date   GLUCAP 104 (H) 04/09/2020   GLUCAP 99 04/09/2020   GLUCAP 88 04/09/2020   GLUCAP 140 (H) 04/08/2020   GLUCAP 108 (H) 04/08/2020     Pulmonary Assessment Scores:  Pulmonary Assessment Scores     Row Name 09/23/23 1305         ADL UCSD   ADL Phase Entry     SOB Score total 63       CAT Score   CAT Score 22       mMRC Score   mMRC Score 4       UCSD: Self-administered rating of dyspnea associated with activities of daily living (ADLs) 6-point scale (0 = not at all to 5 = maximal or unable to do because of breathlessness)  Scoring Scores range from 0 to 120.  Minimally important difference is 5 units  CAT: CAT can identify the health impairment of COPD  patients and is better correlated with disease progression.  CAT has a scoring range of zero to 40. The CAT score is classified into four groups of low (less than 10), medium (10 - 20), high (21-30) and very high (31-40) based on the impact level of disease on health status. A CAT score over 10 suggests significant symptoms.  A worsening CAT score could  be explained by an exacerbation, poor medication adherence, poor inhaler technique, or progression of COPD or comorbid conditions.  CAT MCID is 2 points  mMRC: mMRC (Modified Medical Research Council) Dyspnea Scale is used to assess the degree of baseline functional disability in patients of respiratory disease due to dyspnea. No minimal important difference is established. A decrease in score of 1 point or greater is considered a positive change.   Pulmonary Function Assessment:  Pulmonary Function Assessment - 09/23/23 1318       Breath   Bilateral Breath Sounds Clear;Decreased    Shortness of Breath Yes;Limiting activity;Fear of Shortness of Breath          Exercise Target Goals: Exercise Program Goal: Individual exercise prescription set using results from initial 6 min walk test and THRR while considering  patient's activity barriers and safety.   Exercise Prescription Goal: Initial exercise prescription builds to 30-45 minutes a day of aerobic activity, 2-3 days per week.  Home exercise guidelines will be given to patient during program as part of exercise prescription that the participant will acknowledge.  Activity Barriers & Risk Stratification:  Activity Barriers & Cardiac Risk Stratification - 09/23/23 1316       Activity Barriers & Cardiac Risk Stratification   Activity Barriers Deconditioning;Muscular Weakness;Shortness of Breath;Arthritis    Cardiac Risk Stratification Moderate          6 Minute Walk:  6 Minute Walk     Row Name 09/23/23 1411         6 Minute Walk   Phase Initial     Distance 618 feet      Walk Time 6 minutes     # of Rest Breaks 2  2:50-3:13; 4:00-4:20     MPH 1.17     METS 0.58     RPE 11     Perceived Dyspnea  1     VO2 Peak 2.03     Symptoms No     Resting HR 68 bpm     Resting BP 144/64     Resting Oxygen Saturation  94 %     Exercise Oxygen Saturation  during 6 min walk 85 %     Max Ex. HR 85 bpm     Max Ex. BP 160/70     2 Minute Post BP 142/70       Interval HR   1 Minute HR 85     2 Minute HR 80     3 Minute HR 72     4 Minute HR 84     5 Minute HR 82     6 Minute HR 87     2 Minute Post HR 69     Interval Heart Rate? Yes       Interval Oxygen   Interval Oxygen? Yes     Baseline Oxygen Saturation % 94 %     1 Minute Oxygen Saturation % 92 %     1 Minute Liters of Oxygen 0 L     2 Minute Oxygen Saturation % 93 %     2 Minute Liters of Oxygen 0 L     3 Minute Oxygen Saturation % 90 %     3 Minute Liters of Oxygen 0 L     4 Minute Oxygen Saturation % 85 %     4 Minute Liters of Oxygen 0 L  placed on 2L     5 Minute Oxygen Saturation % 93 %  5 Minute Liters of Oxygen 2 L     6 Minute Oxygen Saturation % 91 %     6 Minute Liters of Oxygen 2 L     2 Minute Post Oxygen Saturation % 98 %     2 Minute Post Liters of Oxygen 2 L        Oxygen Initial Assessment:  Oxygen Initial Assessment - 09/23/23 1317       Home Oxygen   Home Oxygen Device None    Sleep Oxygen Prescription CPAP    Home Exercise Oxygen Prescription None    Home Resting Oxygen Prescription None      Initial 6 min Walk   Oxygen Used Continuous    Liters per minute 2      Program Oxygen Prescription   Program Oxygen Prescription Continuous    Liters per minute 2      Intervention   Short Term Goals To learn and understand importance of monitoring SPO2 with pulse oximeter and demonstrate accurate use of the pulse oximeter.;To learn and understand importance of maintaining oxygen saturations>88%;To learn and demonstrate proper pursed lip breathing techniques or other  breathing techniques. ;To learn and demonstrate proper use of respiratory medications;To learn and exhibit compliance with exercise, home and travel O2 prescription    Long  Term Goals Maintenance of O2 saturations>88%;Compliance with respiratory medication;Verbalizes importance of monitoring SPO2 with pulse oximeter and return demonstration;Exhibits proper breathing techniques, such as pursed lip breathing or other method taught during program session;Demonstrates proper use of MDI's;Exhibits compliance with exercise, home  and travel O2 prescription          Oxygen Re-Evaluation:   Oxygen Discharge (Final Oxygen Re-Evaluation):   Initial Exercise Prescription:  Initial Exercise Prescription - 09/23/23 1400       Date of Initial Exercise RX and Referring Provider   Date 09/23/23    Referring Provider Dr. Okey    Expected Discharge Date 12/22/23      Oxygen   Oxygen Continuous    Liters 2    Maintain Oxygen Saturation 88% or higher      T5 Nustep   Level 1    SPM 70    Minutes 15    METs 1.5      Track   Laps 5    Minutes 15    METs 1.7      Prescription Details   Frequency (times per week) 2    Duration Progress to 30 minutes of continuous aerobic without signs/symptoms of physical distress      Intensity   THRR 40-80% of Max Heartrate 65-131    Ratings of Perceived Exertion 11-13    Perceived Dyspnea 0-4      Progression   Progression Continue to progress workloads to maintain intensity without signs/symptoms of physical distress.      Resistance Training   Training Prescription Yes    Weight red bands    Reps 10-15          Perform Capillary Blood Glucose checks as needed.  Exercise Prescription Changes:   Exercise Comments:   Exercise Goals and Review:   Exercise Goals     Row Name 09/23/23 1305             Exercise Goals   Increase Physical Activity Yes       Intervention Provide advice, education, support and counseling about  physical activity/exercise needs.;Develop an individualized exercise prescription for aerobic and resistive training based on initial evaluation findings,  risk stratification, comorbidities and participant's personal goals.       Expected Outcomes Short Term: Attend rehab on a regular basis to increase amount of physical activity.;Long Term: Add in home exercise to make exercise part of routine and to increase amount of physical activity.;Long Term: Exercising regularly at least 3-5 days a week.       Increase Strength and Stamina Yes       Intervention Provide advice, education, support and counseling about physical activity/exercise needs.;Develop an individualized exercise prescription for aerobic and resistive training based on initial evaluation findings, risk stratification, comorbidities and participant's personal goals.       Expected Outcomes Short Term: Increase workloads from initial exercise prescription for resistance, speed, and METs.;Short Term: Perform resistance training exercises routinely during rehab and add in resistance training at home;Long Term: Improve cardiorespiratory fitness, muscular endurance and strength as measured by increased METs and functional capacity ( )       Able to understand and use rate of perceived exertion (RPE) scale Yes       Intervention Provide education and explanation on how to use RPE scale       Expected Outcomes Short Term: Able to use RPE daily in rehab to express subjective intensity level;Long Term:  Able to use RPE to guide intensity level when exercising independently       Able to understand and use Dyspnea scale Yes       Intervention Provide education and explanation on how to use Dyspnea scale       Expected Outcomes Long Term: Able to use Dyspnea scale to guide intensity level when exercising independently;Short Term: Able to use Dyspnea scale daily in rehab to express subjective sense of shortness of breath during exertion       Knowledge  and understanding of Target Heart Rate Range (THRR) Yes       Intervention Provide education and explanation of THRR including how the numbers were predicted and where they are located for reference       Expected Outcomes Short Term: Able to state/look up THRR;Long Term: Able to use THRR to govern intensity when exercising independently;Short Term: Able to use daily as guideline for intensity in rehab       Understanding of Exercise Prescription Yes       Intervention Provide education, explanation, and written materials on patient's individual exercise prescription       Expected Outcomes Short Term: Able to explain program exercise prescription;Long Term: Able to explain home exercise prescription to exercise independently          Exercise Goals Re-Evaluation :   Discharge Exercise Prescription (Final Exercise Prescription Changes):   Nutrition:  Target Goals: Understanding of nutrition guidelines, daily intake of sodium 1500mg , cholesterol 200mg , calories 30% from fat and 7% or less from saturated fats, daily to have 5 or more servings of fruits and vegetables.  Biometrics:  Pre Biometrics - 09/23/23 1419       Pre Biometrics   Grip Strength 22 kg           Nutrition Therapy Plan and Nutrition Goals:   Nutrition Assessments:  MEDIFICTS Score Key: >=70 Need to make dietary changes  40-70 Heart Healthy Diet <= 40 Therapeutic Level Cholesterol Diet  Flowsheet Row CARDIAC REHAB PHASE II EXERCISE from 09/24/2020 in [facility]  Picture Your Plate Total Score on Admission 65   Picture Your Plate Scores: <59 Unhealthy dietary pattern  with much room for improvement. 41-50 Dietary pattern unlikely to meet recommendations for good health and room for improvement. 51-60 More healthful dietary pattern, with some room for improvement.  >60 Healthy dietary pattern, although there may be some specific behaviors that  could be improved.    Nutrition Goals Re-Evaluation:   Nutrition Goals Discharge (Final Nutrition Goals Re-Evaluation):   Psychosocial: Target Goals: Acknowledge presence or absence of significant depression and/or stress, maximize coping skills, provide positive support system. Participant is able to verbalize types and ability to use techniques and skills needed for reducing stress and depression.  Initial Review & Psychosocial Screening:  Initial Psych Review & Screening - 09/23/23 1312       Initial Review   Current issues with Current Psychotropic Meds;Current Stress Concerns    Source of Stress Concerns Chronic Illness;Family    Comments Zamara states her daily stressors are her health and family issues      Family Dynamics   Good Support System? Yes      Barriers   Psychosocial barriers to participate in program The patient should benefit from training in stress management and relaxation.      Screening Interventions   Interventions Encouraged to exercise;To provide support and resources with identified psychosocial needs    Expected Outcomes Short Term goal: Utilizing psychosocial counselor, staff and physician to assist with identification of specific Stressors or current issues interfering with healing process. Setting desired goal for each stressor or current issue identified.;Long Term Goal: Stressors or current issues are controlled or eliminated.          Quality of Life Scores:  Scores of 19 and below usually indicate a poorer quality of life in these areas.  A difference of  2-3 points is a clinically meaningful difference.  A difference of 2-3 points in the total score of the Quality of Life Index has been associated with significant improvement in overall quality of life, self-image, physical symptoms, and general health in studies assessing change in quality of life.  PHQ-9: Review Flowsheet  More data exists      09/23/2023 01/15/2023 10/23/2021 09/20/2020  01/04/2019  Depression screen PHQ 2/9  Decreased Interest 0 1 0 0 0  Down, Depressed, Hopeless 0 1 0 0 0  PHQ - 2 Score 0 2 0 0 0  Altered sleeping 1 1 1  - 1  Tired, decreased energy 1 3 1  - 1  Change in appetite 0 2 1 - 1  Feeling bad or failure about yourself  0 0 0 - 1  Trouble concentrating 0 0 0 - 0  Moving slowly or fidgety/restless 0 0 0 - 0  Suicidal thoughts 0 0 0 - 0  PHQ-9 Score 2 8 3  - 4  Difficult doing work/chores Not difficult at all Very difficult - - -   Interpretation of Total Score  Total Score Depression Severity:  1-4 = Minimal depression, 5-9 = Mild depression, 10-14 = Moderate depression, 15-19 = Moderately severe depression, 20-27 = Severe depression   Psychosocial Evaluation and Intervention:  Psychosocial Evaluation - 09/23/23 1406       Psychosocial Evaluation & Interventions   Interventions Stress management education;Encouraged to exercise with the program and follow exercise prescription;Relaxation education    Comments Lisbet denies any depression or history of depression. She states her daily stressors are her health and also family issues. She is currently taking psychotropic meds and denies any needs at this time. Staff will provide Shambria with stress reduction education.  Expected Outcomes For Urijah to participate in PR with less stress.    Continue Psychosocial Services  No Follow up required          Psychosocial Re-Evaluation:   Psychosocial Discharge (Final Psychosocial Re-Evaluation):   Education: Education Goals: Education classes will be provided on a weekly basis, covering required topics. Participant will state understanding/return demonstration of topics presented.  Learning Barriers/Preferences:  Learning Barriers/Preferences - 09/23/23 1314       Learning Barriers/Preferences   Learning Barriers Sight    Learning Preferences None          Education Topics: Know Your Numbers Group instruction that is supported by a  PowerPoint presentation. Instructor discusses importance of knowing and understanding resting, exercise, and post-exercise oxygen saturation, heart rate, and blood pressure. Oxygen saturation, heart rate, blood pressure, rating of perceived exertion, and dyspnea are reviewed along with a normal range for these values.    Exercise for the Pulmonary Patient Group instruction that is supported by a PowerPoint presentation. Instructor discusses benefits of exercise, core components of exercise, frequency, duration, and intensity of an exercise routine, importance of utilizing pulse oximetry during exercise, safety while exercising, and options of places to exercise outside of rehab.    MET Level  Group instruction provided by PowerPoint, verbal discussion, and written material to support subject matter. Instructor reviews what METs are and how to increase METs.    Pulmonary Medications Verbally interactive group education provided by instructor with focus on inhaled medications and proper administration.   Anatomy and Physiology of the Respiratory System Group instruction provided by PowerPoint, verbal discussion, and written material to support subject matter. Instructor reviews respiratory cycle and anatomical components of the respiratory system and their functions. Instructor also reviews differences in obstructive and restrictive respiratory diseases with examples of each.    Oxygen Safety Group instruction provided by PowerPoint, verbal discussion, and written material to support subject matter. There is an overview of "What is Oxygen" and "Why do we need it".  Instructor also reviews how to create a safe environment for oxygen use, the importance of using oxygen as prescribed, and the risks of noncompliance. There is a brief discussion on traveling with oxygen and resources the patient may utilize.   Oxygen Use Group instruction provided by PowerPoint, verbal discussion, and written  material to discuss how supplemental oxygen is prescribed and different types of oxygen supply systems. Resources for more information are provided.    Breathing Techniques Group instruction that is supported by demonstration and informational handouts. Instructor discusses the benefits of pursed lip and diaphragmatic breathing and detailed demonstration on how to perform both.     Risk Factor Reduction Group instruction that is supported by a PowerPoint presentation. Instructor discusses the definition of a risk factor, different risk factors for pulmonary disease, and how the heart and lungs work together.   Pulmonary Diseases Group instruction provided by PowerPoint, verbal discussion, and written material to support subject matter. Instructor gives an overview of the different type of pulmonary diseases. There is also a discussion on risk factors and symptoms as well as ways to manage the diseases.   Stress and Energy Conservation Group instruction provided by PowerPoint, verbal discussion, and written material to support subject matter. Instructor gives an overview of stress and the impact it can have on the body. Instructor also reviews ways to reduce stress. There is also a discussion on energy conservation and ways to conserve energy throughout the day.   Warning Signs and  Symptoms Group instruction provided by PowerPoint, verbal discussion, and written material to support subject matter. Instructor reviews warning signs and symptoms of stroke, heart attack, cold and flu. Instructor also reviews ways to prevent the spread of infection.   Other Education Group or individual verbal, written, or video instructions that support the educational goals of the pulmonary rehab program.    Knowledge Questionnaire Score:  Knowledge Questionnaire Score - 09/23/23 1409       Knowledge Questionnaire Score   Pre Score 16/18          Core Components/Risk Factors/Patient Goals at  Admission:  Personal Goals and Risk Factors at Admission - 09/23/23 1314       Core Components/Risk Factors/Patient Goals on Admission   Improve shortness of breath with ADL's Yes    Intervention Provide education, individualized exercise plan and daily activity instruction to help decrease symptoms of SOB with activities of daily living.    Expected Outcomes Short Term: Improve cardiorespiratory fitness to achieve a reduction of symptoms when performing ADLs;Long Term: Be able to perform more ADLs without symptoms or delay the onset of symptoms          Core Components/Risk Factors/Patient Goals Review:    Core Components/Risk Factors/Patient Goals at Discharge (Final Review):    ITP Comments:   Comments: Dr. Slater Staff is Medical Director for Pulmonary Rehab at [facility].

## 2023-09-23 NOTE — Progress Notes (Signed)
 [name] 58 y.o. female Pulmonary Rehab Orientation Note This patient who was referred to Pulmonary Rehab by Dr. Okey with the diagnosis of OSA arrived today in Cardiac and Pulmonary Rehab. She  arrived ambulatory with normal gait. She  does not carry portable oxygen. Per patient, [name] uses oxygen never. Color good, skin warm and dry. Patient is oriented to time and place. Patient's medical history, psychosocial health, and medications reviewed. Psychosocial assessment reveals patient lives with alone. [name] is currently retired. Patient hobbies include shopping and crafting. Patient reports her stress level is low. Areas of stress/anxiety include health and family . Patient does not exhibit signs of depression.  PHQ2/9 score 0/2. Zoi shows good  coping skills with positive outlook on life. Offered emotional support and reassurance. Will continue to monitor. Physical assessment performed by Nurse pick: Ronal Levin RN. Please see their orientation physical assessment note. [name] reports she does take medications as prescribed. Patient states she follows a regular  diet. The patient reports no specific efforts to gain or lose weight.. Patient's weight will be monitored closely. Demonstration and practice of PLB using pulse oximeter. Betta able to return demonstration satisfactorily. Safety and hand hygiene in the exercise area reviewed with patient. [name] voices understanding of the information reviewed. Department expectations discussed with patient and achievable goals were set. The patient shows enthusiasm about attending the program and we look forward to working with Cheryl. [name] completed a 6 min walk test today and is scheduled to begin exercise on 10/01/23.   1300-1410 [name], BSRT

## 2023-09-23 NOTE — Telephone Encounter (Signed)
 OK to prescribe O2  based on walk test

## 2023-09-23 NOTE — Telephone Encounter (Signed)
 Pulmonary care rehab is calling on pt. Pt performed a 6 minute walk test at rehab and they would like to request an oxygen prescription for pt.

## 2023-09-23 NOTE — Telephone Encounter (Signed)
 Left message for Pulmonary Rehab staff informing them Dr. Okey said OK to prescribe oxygen based on walk test.  Provided office number for callback if any questions.

## 2023-09-24 ENCOUNTER — Ambulatory Visit (INDEPENDENT_AMBULATORY_CARE_PROVIDER_SITE_OTHER): Payer: Self-pay

## 2023-09-24 DIAGNOSIS — Z7901 Long term (current) use of anticoagulants: Secondary | ICD-10-CM

## 2023-09-24 LAB — POCT INR: INR: 2.4 (ref 2.0–3.0)

## 2023-09-24 NOTE — Telephone Encounter (Signed)
 LVM to remind it is time to test INR.

## 2023-09-24 NOTE — Patient Instructions (Addendum)
 Pre visit review using our clinic review tool, if applicable. No additional management support is needed unless otherwise documented below in the visit note.  Increase dose today to take 3 tablets and then continue 1 1/2 tablet daily except take 2 tablets on Monday and Thursday. Recheck on 9/8.

## 2023-09-24 NOTE — Progress Notes (Signed)
 Pt tests INR at home.  Pt reports she will start pulmonary rehab next week. She had her first assessment and is being prescribed oxygen. She is looking forward to this rehab.  Pt tests INR at home.  Pt reports she will start pulmonary rehab next week. She had her first assessment and is being prescribed oxygen. She is looking forward to this rehab.  Increase dose today to take 3 tablets and then continue 1 1/2 tablet daily except take 2 tablets on Monday and Thursday. Recheck on 9/8.  Advised pt of dose change and recheck date. Pt verbalized understanding.  Advised pt of dose change and recheck date. Pt verbalized understanding.

## 2023-09-25 ENCOUNTER — Telehealth (HOSPITAL_COMMUNITY): Payer: Self-pay

## 2023-09-25 DIAGNOSIS — R0902 Hypoxemia: Secondary | ICD-10-CM | POA: Insufficient documentation

## 2023-09-25 NOTE — Telephone Encounter (Signed)
 You just have to sign order that's pending

## 2023-09-25 NOTE — Telephone Encounter (Signed)
 How do I get to that pended order? Thanks

## 2023-09-25 NOTE — Telephone Encounter (Signed)
 Dr. Okey' office staff returned call about ordering home oxygen. Stated Dr. Okey would not need order oxygen, order would need to come from pt's PCP

## 2023-09-25 NOTE — Telephone Encounter (Signed)
Signed. Printed.

## 2023-09-25 NOTE — Telephone Encounter (Signed)
 Call to pulmonary rehab, spoke to [facility] Patient referred to pulmonary rehab by Dr. Okey 07/29/23 after it was determined patient does not qualify for cardiac rehab  [facility] directed me to 6 minute walk test performed on 09/23/23 that indicated patient needs 2L of continuous oxygen. Ordered pended and forwarded to Dr. Okey.

## 2023-09-25 NOTE — Telephone Encounter (Signed)
 Caller [facility]) stated they will not be able to put in orders for the patient to have oxygen.

## 2023-09-29 ENCOUNTER — Other Ambulatory Visit: Payer: Self-pay

## 2023-09-29 DIAGNOSIS — K219 Gastro-esophageal reflux disease without esophagitis: Secondary | ICD-10-CM

## 2023-09-29 DIAGNOSIS — R112 Nausea with vomiting, unspecified: Secondary | ICD-10-CM

## 2023-10-01 ENCOUNTER — Encounter (HOSPITAL_COMMUNITY)
Admission: RE | Admit: 2023-10-01 | Discharge: 2023-10-01 | Disposition: A | Discharge status: Home / Self Care | Source: Ambulatory Visit | Attending: Internal Medicine

## 2023-10-01 VITALS — Wt 367.7 lb

## 2023-10-01 DIAGNOSIS — G4733 Obstructive sleep apnea (adult) (pediatric): Secondary | ICD-10-CM

## 2023-10-01 NOTE — Progress Notes (Signed)
 Daily Session Note  Patient Details  Name: [name] MRN: [id number] Date of Birth: 11-04-1965 Referring Provider:   Conrad Ports Pulmonary Rehab Walk Test from 09/23/2023 in [facility]  Referring Provider Dr. Okey    Encounter Date: 10/01/2023  Check In:  Session Check In - 10/01/23 1507       Check-In   Supervising physician immediately available to respond to emergencies [facility] MD immediately available    Physician(s) Jackee Alberts, NP    Location [facility]    Staff Present Ronal Levin, RN, BSN;Casey Claudene, Neita Moats, MS, ACSM-CEP, Exercise Physiologist;Randi [facility], ACSM-CEP, Exercise Physiologist    Virtual Visit No    Medication changes reported     No    Fall or balance concerns reported    No    Tobacco Cessation No Change    Warm-up and Cool-down Performed as group-led instruction    Resistance Training Performed Yes    VAD Patient? No    PAD/SET Patient? No      Pain Assessment   Currently in Pain? No/denies    Pain Score 0-No pain    Multiple Pain Sites No          Capillary Blood Glucose: No results found. However, due to the size of the patient record, not all encounters were searched. Please check Results Review for a complete set of results.    Social History   Tobacco Use  Smoking Status Former   Current packs/day: 0.00   Average packs/day: 0.5 packs/day for 10.0 years (5.0 ttl pk-yrs)   Types: Cigarettes   Start date: 05/05/1999   Quit date: 05/04/2009   Years since quitting: 14.4  Smokeless Tobacco Never  Tobacco Comments   Socially x10 years (1 pack/month)    Goals Met:  Proper associated with RPD/PD & O2 Sat Exercise tolerated well No report of concerns or symptoms today Strength training completed today  Goals Unmet:  Not Applicable  Comments: Service time is from 1310 to 1450.    Dr. Slater Staff is Medical Director for Pulmonary Rehab at [facility].

## 2023-10-06 ENCOUNTER — Encounter (HOSPITAL_COMMUNITY)
Admission: RE | Admit: 2023-10-06 | Discharge: 2023-10-06 | Disposition: A | Discharge status: Home / Self Care | Source: Ambulatory Visit | Attending: Internal Medicine | Admitting: Internal Medicine

## 2023-10-06 ENCOUNTER — Telehealth (HOSPITAL_COMMUNITY): Payer: Self-pay | Admitting: *Deleted

## 2023-10-06 DIAGNOSIS — G4733 Obstructive sleep apnea (adult) (pediatric): Secondary | ICD-10-CM | POA: Insufficient documentation

## 2023-10-06 NOTE — Telephone Encounter (Signed)
 Left voice mail message informing us  she will not be able to make Pulmonary rehab today but should make Thursday.

## 2023-10-07 NOTE — Progress Notes (Signed)
 Pulmonary Individual Treatment Plan  Patient Details  Name: [name] MRN: [id number] Date of Birth: 1965/10/20 Referring Provider:   Conrad Ports Pulmonary Rehab Walk Test from 09/23/2023 in [facility]  Referring Provider Dr. Okey    Initial Encounter Date:  Flowsheet Row Pulmonary Rehab Walk Test from 09/23/2023 in [facility]  Date 09/23/23    Visit Diagnosis: OSA (obstructive sleep apnea)  Patient's Home Medications on Admission:  Current Outpatient Medications:    albuterol  (VENTOLIN  HFA) 108 (90 Base) MCG/ACT inhaler, Inhale 1-2 puffs into the lungs every 6 (six) hours as needed for wheezing or shortness of breath., Disp: 8.5 g, Rfl: 3   amoxicillin  (AMOXIL ) 500 MG tablet, TAKE 4 TABS (2000 MG) 30-60 MIN BEFORE DENTAL PROCEDURE., Disp: 4 tablet, Rfl: 2   aspirin  EC 81 MG EC tablet, Take 1 tablet (81 mg total) by mouth daily. Swallow whole., Disp: 30 tablet, Rfl: 11   benzonatate  (TESSALON ) 200 MG capsule, Take 1 capsule (200 mg total) by mouth 3 (three) times daily as needed. Swallow whole, Disp: 30 capsule, Rfl: 1   bismuth  subsalicylate (PEPTO BISMOL) 262 MG/15ML suspension, Take 30 mLs by mouth every 6 (six) hours as needed for indigestion or diarrhea or loose stools., Disp: , Rfl:    calcium  carbonate (TUMS - DOSED IN MG ELEMENTAL CALCIUM ) 500 MG chewable tablet, Chew 1-2 tablets by mouth 3 (three) times daily as needed for indigestion or heartburn., Disp: , Rfl:    cyanocobalamin  (VITAMIN B12) 1000 MCG/ML injection, Inject 1 mL (1,000 mcg total) into the muscle every 6 (six) weeks., Disp: 1 mL, Rfl: 2   cyclobenzaprine  (FLEXERIL ) 10 MG tablet, Take 1 tablet (10 mg total) by mouth 3 (three) times daily as needed., Disp: 15 tablet, Rfl: 0   ezetimibe  (ZETIA ) 10 MG tablet, TAKE 1 TABLET BY MOUTH EVERY DAY, Disp: 90 tablet, Rfl: 0   furosemide  (LASIX ) 20 MG  tablet, Take 20 mg by mouth daily., Disp: , Rfl:    hydroxychloroquine  (PLAQUENIL ) 200 MG tablet, Take 200 mg by mouth 2 (two) times daily., Disp: , Rfl:    levothyroxine  (SYNTHROID ) 175 MCG tablet, TAKE 1 TABLET BY MOUTH DAILY BEFORE BREAKFAST., Disp: 90 tablet, Rfl: 1   metoprolol  tartrate (LOPRESSOR ) 50 MG tablet, TAKE 1 TABLET BY MOUTH TWICE A DAY, Disp: 60 tablet, Rfl: 0   Multiple Vitamins-Minerals (BARIATRIC MULTIVITAMINS/IRON ) CAPS, Take 1 tablet by mouth daily., Disp: , Rfl:    omeprazole  (PRILOSEC) 40 MG capsule, Take 1 capsule (40 mg total) by mouth daily., Disp: 90 capsule, Rfl: 3   PARoxetine  (PAXIL ) 40 MG tablet, TAKE 1 TABLET BY MOUTH EVERY DAY IN THE MORNING, Disp: 90 tablet, Rfl: 0   rosuvastatin  (CRESTOR ) 40 MG tablet, TAKE 1 TABLET BY MOUTH EVERY DAY, Disp: 90 tablet, Rfl: 3   tirzepatide  (ZEPBOUND ) 2.5 MG/0.5ML Pen, Inject 2.5 mg into the skin once a week. 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   HLD (hyperlipidemia)    Iron  deficiency anemia    IV dextran [place]   Nephritis and nephropathy, not specified as acute or chronic, with unspecified pathological lesion in kidney    Nonspecific abnormal results of liver function study    Obesity, unspecified    Other  specified acquired hypothyroidism    Panic disorder without agoraphobia    Personal history of venous thrombosis and embolism 2002   during pregnancy; ?factor 5 leiden (sees heme)   Pleural effusion 2005   c/w lupus initial w/u; recurrent Right as pred tapered off July 2011   Sleep apnea    Systemic lupus erythematosus (HCC)    renal GN, hx of pericardial eff in late 90's   Unspecified essential hypertension     Tobacco Use: Social History   Tobacco Use  Smoking Status Former   Current packs/day: 0.00   Average packs/day: 0.5 packs/day for 10.0 years (5.0 ttl pk-yrs)   Types: Cigarettes   Start date: 05/05/1999   Quit date: 05/04/2009   Years since quitting: 14.4  Smokeless Tobacco Never  Tobacco Comments   Socially x10 years (1 pack/month)    Labs: Review Flowsheet  More data exists      Latest Ref Rng & Units 07/06/2020 09/07/2020 10/16/2021 01/15/2023 07/15/2023  Labs for ITP Cardiac and Pulmonary Rehab  Cholestrol 0 - 200 mg/dL 814  836  855  838  -  LDL (calc) 0 - 99 mg/dL 890  91  71  84  -  HDL-C >39.00 mg/dL 57  57  48.69  44.59  -  Trlycerides 0.0 - 149.0 mg/dL 894  79  892.9  890.9  -  Hemoglobin A1c <5.7 % - - 6.3  6.4  6.3      Pulmonary Assessment Scores:  Pulmonary Assessment Scores     Row Name 09/23/23 1305         ADL UCSD   ADL Phase Entry     SOB Score total 63       CAT Score   CAT Score 22       mMRC Score   mMRC Score 4        UCSD: Self-administered rating of dyspnea associated with activities of daily living (ADLs) 6-point scale (0 = not at all to 5 = maximal or unable to do because of breathlessness)  Scoring Scores range from 0 to 120.  Minimally important difference is 5 units  CAT: CAT can identify the health impairment of COPD patients and is better correlated with disease progression.  CAT has a scoring range of zero to 40. The CAT score is classified into four groups of low (less than 10), medium (10 - 20), high (21-30) and  very high (31-40) based on the impact level of disease on health status. A CAT score over 10 suggests significant symptoms.  A worsening CAT score could be explained by an exacerbation, poor medication adherence, poor inhaler technique, or progression of COPD or comorbid conditions.  CAT MCID is 2 points  mMRC: mMRC (Modified Medical Research Council) Dyspnea Scale is used to assess the degree of  baseline functional disability in patients of respiratory disease due to dyspnea. No minimal important difference is established. A decrease in score of 1 point or greater is considered a positive change.   Pulmonary Function Assessment:  Pulmonary Function Assessment - 09/23/23 1318       Breath   Bilateral Breath Sounds Clear;Decreased    Shortness of Breath Yes;Limiting activity;Fear of Shortness of Breath          Exercise Target Goals: Exercise Program Goal: Individual exercise prescription set using results from initial 6 min walk test and THRR while considering  patient's activity barriers and safety.   Exercise Prescription Goal: Initial exercise prescription builds to 30-45 minutes a day of aerobic activity, 2-3 days per week.  Home exercise guidelines will be given to patient during program as part of exercise prescription that the participant will acknowledge.  Education: Aerobic Exercise: - Group verbal and visual presentation on the components of exercise prescription. Introduces F.I.T.T principle from ACSM for exercise prescriptions.  Reviews F.I.T.T. principles of aerobic exercise including progression. Written material provided at class time.   Education: Resistance Exercise: - Group verbal and visual presentation on the components of exercise prescription. Introduces F.I.T.T principle from ACSM for exercise prescriptions  Reviews F.I.T.T. principles of resistance exercise including progression. Written material provided at class time.    Education: Exercise & Equipment  Safety: - Individual verbal instruction and demonstration of equipment use and safety with use of the equipment.   Education: Exercise Physiology & General Exercise Guidelines: - Group verbal and written instruction with models to review the exercise physiology of the cardiovascular system and associated critical values. Provides general exercise guidelines with specific guidelines to those with heart or lung disease.    Education: Flexibility, Balance, Mind/Body Relaxation: - Group verbal and visual presentation with interactive activity on the components of exercise prescription. Introduces F.I.T.T principle from ACSM for exercise prescriptions. Reviews F.I.T.T. principles of flexibility and balance exercise training including progression. Also discusses the mind body connection.  Reviews various relaxation techniques to help reduce and manage stress (i.e. Deep breathing, progressive muscle relaxation, and visualization). Balance handout provided to take home. Written material provided at class time.   Activity Barriers & Risk Stratification:  Activity Barriers & Cardiac Risk Stratification - 09/23/23 1316       Activity Barriers & Cardiac Risk Stratification   Activity Barriers Deconditioning;Muscular Weakness;Shortness of Breath;Arthritis    Cardiac Risk Stratification Moderate          6 Minute Walk:  6 Minute Walk     Row Name 09/23/23 1411         6 Minute Walk   Phase Initial     Distance 618 feet     Walk Time 6 minutes     # of Rest Breaks 2  2:50-3:13; 4:00-4:20     MPH 1.17     METS 0.58     RPE 11     Perceived Dyspnea  1     VO2 Peak 2.03     Symptoms No     Resting HR 68 bpm     Resting BP 144/64     Resting Oxygen Saturation  94 %     Exercise Oxygen Saturation  during 6 min walk 85 %     Max Ex. HR 85 bpm     Max Ex. BP 160/70     2 Minute Post BP 142/70       Interval HR   1 Minute  HR 85     2 Minute HR 80     3 Minute HR 72     4 Minute HR 84      5 Minute HR 82     6 Minute HR 87     2 Minute Post HR 69     Interval Heart Rate? Yes       Interval Oxygen   Interval Oxygen? Yes     Baseline Oxygen Saturation % 94 %     1 Minute Oxygen Saturation % 92 %     1 Minute Liters of Oxygen 0 L     2 Minute Oxygen Saturation % 93 %     2 Minute Liters of Oxygen 0 L     3 Minute Oxygen Saturation % 90 %     3 Minute Liters of Oxygen 0 L     4 Minute Oxygen Saturation % 85 %     4 Minute Liters of Oxygen 0 L  placed on 2L     5 Minute Oxygen Saturation % 93 %     5 Minute Liters of Oxygen 2 L     6 Minute Oxygen Saturation % 91 %     6 Minute Liters of Oxygen 2 L     2 Minute Post Oxygen Saturation % 98 %     2 Minute Post Liters of Oxygen 2 L       Oxygen Initial Assessment:  Oxygen Initial Assessment - 09/23/23 1317       Home Oxygen   Home Oxygen Device None    Sleep Oxygen Prescription CPAP    Home Exercise Oxygen Prescription None    Home Resting Oxygen Prescription None      Initial 6 min Walk   Oxygen Used Continuous    Liters per minute 2      Program Oxygen Prescription   Program Oxygen Prescription Continuous    Liters per minute 2      Intervention   Short Term Goals To learn and understand importance of monitoring SPO2 with pulse oximeter and demonstrate accurate use of the pulse oximeter.;To learn and understand importance of maintaining oxygen saturations>88%;To learn and demonstrate proper pursed lip breathing techniques or other breathing techniques. ;To learn and demonstrate proper use of respiratory medications;To learn and exhibit compliance with exercise, home and travel O2 prescription    Long  Term Goals Maintenance of O2 saturations>88%;Compliance with respiratory medication;Verbalizes importance of monitoring SPO2 with pulse oximeter and return demonstration;Exhibits proper breathing techniques, such as pursed lip breathing or other method taught during program session;Demonstrates proper use of  MDI's;Exhibits compliance with exercise, home  and travel O2 prescription          Oxygen Re-Evaluation:  Oxygen Re-Evaluation     Row Name 09/29/23 0846             Program Oxygen Prescription   Program Oxygen Prescription Continuous       Liters per minute 2         Home Oxygen   Home Oxygen Device None       Sleep Oxygen Prescription CPAP       Home Exercise Oxygen Prescription None       Home Resting Oxygen Prescription None         Goals/Expected Outcomes   Short Term Goals To learn and understand importance of monitoring SPO2 with pulse oximeter and demonstrate accurate use of the pulse oximeter.;To learn and understand importance  of maintaining oxygen saturations>88%;To learn and demonstrate proper pursed lip breathing techniques or other breathing techniques. ;To learn and demonstrate proper use of respiratory medications;To learn and exhibit compliance with exercise, home and travel O2 prescription       Long  Term Goals Maintenance of O2 saturations>88%;Compliance with respiratory medication;Verbalizes importance of monitoring SPO2 with pulse oximeter and return demonstration;Exhibits proper breathing techniques, such as pursed lip breathing or other method taught during program session;Demonstrates proper use of MDI's;Exhibits compliance with exercise, home  and travel O2 prescription       Goals/Expected Outcomes Compliance and understanding of oxygen saturation monitoring and breathing techniques to decrease shortness of breath.          Oxygen Discharge (Final Oxygen Re-Evaluation):  Oxygen Re-Evaluation - 09/29/23 0846       Program Oxygen Prescription   Program Oxygen Prescription Continuous    Liters per minute 2      Home Oxygen   Home Oxygen Device None    Sleep Oxygen Prescription CPAP    Home Exercise Oxygen Prescription None    Home Resting Oxygen Prescription None      Goals/Expected Outcomes   Short Term Goals To learn and understand importance  of monitoring SPO2 with pulse oximeter and demonstrate accurate use of the pulse oximeter.;To learn and understand importance of maintaining oxygen saturations>88%;To learn and demonstrate proper pursed lip breathing techniques or other breathing techniques. ;To learn and demonstrate proper use of respiratory medications;To learn and exhibit compliance with exercise, home and travel O2 prescription    Long  Term Goals Maintenance of O2 saturations>88%;Compliance with respiratory medication;Verbalizes importance of monitoring SPO2 with pulse oximeter and return demonstration;Exhibits proper breathing techniques, such as pursed lip breathing or other method taught during program session;Demonstrates proper use of MDI's;Exhibits compliance with exercise, home  and travel O2 prescription    Goals/Expected Outcomes Compliance and understanding of oxygen saturation monitoring and breathing techniques to decrease shortness of breath.          Initial Exercise Prescription:  Initial Exercise Prescription - 09/23/23 1400       Date of Initial Exercise RX and Referring Provider   Date 09/23/23    Referring Provider Dr. Okey    Expected Discharge Date 12/22/23      Oxygen   Oxygen Continuous    Liters 2    Maintain Oxygen Saturation 88% or higher      T5 Nustep   Level 1    SPM 70    Minutes 15    METs 1.5      Track   Laps 5    Minutes 15    METs 1.7      Prescription Details   Frequency (times per week) 2    Duration Progress to 30 minutes of continuous aerobic without signs/symptoms of physical distress      Intensity   THRR 40-80% of Max Heartrate 65-131    Ratings of Perceived Exertion 11-13    Perceived Dyspnea 0-4      Progression   Progression Continue to progress workloads to maintain intensity without signs/symptoms of physical distress.      Resistance Training   Training Prescription Yes    Weight red bands    Reps 10-15          Perform Capillary Blood Glucose  checks as needed.  Exercise Prescription Changes:   Exercise Prescription Changes     Row Name 10/01/23 1451  Response to Exercise   Blood Pressure (Admit) 124/70       Blood Pressure (Exercise) 124/76       Blood Pressure (Exit) 110/60       Heart Rate (Admit) 63 bpm       Heart Rate (Exercise) 78 bpm       Heart Rate (Exit) 64 bpm       Oxygen Saturation (Admit) 95 %       Oxygen Saturation (Exercise) 94 %       Oxygen Saturation (Exit) 96 %       Rating of Perceived Exertion (Exercise) 15       Perceived Dyspnea (Exercise) 3       Duration Progress to 30 minutes of  aerobic without signs/symptoms of physical distress       Intensity THRR unchanged         Progression   Progression Continue to progress workloads to maintain intensity without signs/symptoms of physical distress.         Resistance Training   Training Prescription Yes       Weight red bands       Reps 10-15       Time 10 Minutes         Oxygen   Oxygen Continuous       Liters 2         NuStep   Level 1       Minutes 15       METs 1.9         Track   Laps 5       Minutes 15       METs 2.08         Oxygen   Maintain Oxygen Saturation 88% or higher          Exercise Comments:   Exercise Goals and Review:   Exercise Goals     Row Name 09/23/23 1305             Exercise Goals   Increase Physical Activity Yes       Intervention Provide advice, education, support and counseling about physical activity/exercise needs.;Develop an individualized exercise prescription for aerobic and resistive training based on initial evaluation findings, risk stratification, comorbidities and participant's personal goals.       Expected Outcomes Short Term: Attend rehab on a regular basis to increase amount of physical activity.;Long Term: Add in home exercise to make exercise part of routine and to increase amount of physical activity.;Long Term: Exercising regularly at least 3-5 days a week.        Increase Strength and Stamina Yes       Intervention Provide advice, education, support and counseling about physical activity/exercise needs.;Develop an individualized exercise prescription for aerobic and resistive training based on initial evaluation findings, risk stratification, comorbidities and participant's personal goals.       Expected Outcomes Short Term: Increase workloads from initial exercise prescription for resistance, speed, and METs.;Short Term: Perform resistance training exercises routinely during rehab and add in resistance training at home;Long Term: Improve cardiorespiratory fitness, muscular endurance and strength as measured by increased METs and functional capacity ( )       Able to understand and use rate of perceived exertion (RPE) scale Yes       Intervention Provide education and explanation on how to use RPE scale       Expected Outcomes Short Term: Able to use RPE daily in rehab to express subjective  intensity level;Long Term:  Able to use RPE to guide intensity level when exercising independently       Able to understand and use Dyspnea scale Yes       Intervention Provide education and explanation on how to use Dyspnea scale       Expected Outcomes Long Term: Able to use Dyspnea scale to guide intensity level when exercising independently;Short Term: Able to use Dyspnea scale daily in rehab to express subjective sense of shortness of breath during exertion       Knowledge and understanding of Target Heart Rate Range (THRR) Yes       Intervention Provide education and explanation of THRR including how the numbers were predicted and where they are located for reference       Expected Outcomes Short Term: Able to state/look up THRR;Long Term: Able to use THRR to govern intensity when exercising independently;Short Term: Able to use daily as guideline for intensity in rehab       Understanding of Exercise Prescription Yes       Intervention Provide education,  explanation, and written materials on patient's individual exercise prescription       Expected Outcomes Short Term: Able to explain program exercise prescription;Long Term: Able to explain home exercise prescription to exercise independently          Exercise Goals Re-Evaluation :  Exercise Goals Re-Evaluation     Row Name 09/29/23 0844             Exercise Goal Re-Evaluation   Exercise Goals Review Increase Physical Activity;Able to understand and use rate of perceived exertion (RPE) scale;Increase Strength and Stamina;Knowledge and understanding of Target Heart Rate Range (THRR);Understanding of Exercise Prescription;Able to understand and use Dyspnea scale       Comments Pt is scheduled to begin exercise 8/28. Will progress as tolerated.       Expected Outcomes Through exercise at rehab and home, the patient will decrease shortness of breath with daily activities and feel confident in carrying out an exercise regimen at home.          Discharge Exercise Prescription (Final Exercise Prescription Changes):  Exercise Prescription Changes - 10/01/23 1451       Response to Exercise   Blood Pressure (Admit) 124/70    Blood Pressure (Exercise) 124/76    Blood Pressure (Exit) 110/60    Heart Rate (Admit) 63 bpm    Heart Rate (Exercise) 78 bpm    Heart Rate (Exit) 64 bpm    Oxygen Saturation (Admit) 95 %    Oxygen Saturation (Exercise) 94 %    Oxygen Saturation (Exit) 96 %    Rating of Perceived Exertion (Exercise) 15    Perceived Dyspnea (Exercise) 3    Duration Progress to 30 minutes of  aerobic without signs/symptoms of physical distress    Intensity THRR unchanged      Progression   Progression Continue to progress workloads to maintain intensity without signs/symptoms of physical distress.      Resistance Training   Training Prescription Yes    Weight red bands    Reps 10-15    Time 10 Minutes      Oxygen   Oxygen Continuous    Liters 2      NuStep   Level 1     Minutes 15    METs 1.9      Track   Laps 5    Minutes 15    METs 2.08  Oxygen   Maintain Oxygen Saturation 88% or higher          Nutrition:  Target Goals: Understanding of nutrition guidelines, daily intake of sodium 1500mg , cholesterol 200mg , calories 30% from fat and 7% or less from saturated fats, daily to have 5 or more servings of fruits and vegetables.  Education: Nutrition 1 -Group instruction provided by verbal, written material, interactive activities, discussions, models, and posters to present general guidelines for heart healthy nutrition including macronutrients, label reading, and promoting whole foods over processed counterparts. Education serves as Pensions consultant of discussion of heart healthy eating for all. Written material provided at class time.     Education: Nutrition 2 -Group instruction provided by verbal, written material, interactive activities, discussions, models, and posters to present general guidelines for heart healthy nutrition including sodium, cholesterol, and saturated fat. Providing guidance of habit forming to improve blood pressure, cholesterol, and body weight. Written material provided at class time.     Biometrics:  Pre Biometrics - 09/23/23 1419       Pre Biometrics   Grip Strength 22 kg           Nutrition Therapy Plan and Nutrition Goals:   Nutrition Assessments:  MEDIFICTS Score Key: >=70 Need to make dietary changes  40-70 Heart Healthy Diet <= 40 Therapeutic Level Cholesterol Diet  Flowsheet Row CARDIAC REHAB PHASE II EXERCISE from 09/24/2020 in [facility]  Picture Your Plate Total Score on Admission 65   Picture Your Plate Scores: <59 Unhealthy dietary pattern with much room for improvement. 41-50 Dietary pattern unlikely to meet recommendations for good health and room for improvement. 51-60 More healthful dietary pattern, with some room for  improvement.  >60 Healthy dietary pattern, although there may be some specific behaviors that could be improved.   Nutrition Goals Re-Evaluation:   Nutrition Goals Discharge (Final Nutrition Goals Re-Evaluation):   Psychosocial: Target Goals: Acknowledge presence or absence of significant depression and/or stress, maximize coping skills, provide positive support system. Participant is able to verbalize types and ability to use techniques and skills needed for reducing stress and depression.   Education: Stress, Anxiety, and Depression - Group verbal and visual presentation to define topics covered.  Reviews how body is impacted by stress, anxiety, and depression.  Also discusses healthy ways to reduce stress and to treat/manage anxiety and depression.  Written material provided at class time.   Education: Sleep Hygiene -Provides group verbal and written instruction about how sleep can affect your health.  Define sleep hygiene, discuss sleep cycles and impact of sleep habits. Review good sleep hygiene tips.    Initial Review & Psychosocial Screening:  Initial Psych Review & Screening - 09/23/23 1312       Initial Review   Current issues with Current Psychotropic Meds;Current Stress Concerns    Source of Stress Concerns Chronic Illness;Family    Comments Amariyana states her daily stressors are her health and family issues      Family Dynamics   Good Support System? Yes      Barriers   Psychosocial barriers to participate in program The patient should benefit from training in stress management and relaxation.      Screening Interventions   Interventions Encouraged to exercise;To provide support and resources with identified psychosocial needs    Expected Outcomes Short Term goal: Utilizing psychosocial counselor, staff and physician to assist with identification of specific Stressors or current issues interfering with healing process. Setting  desired goal for each stressor or current  issue identified.;Long Term Goal: Stressors or current issues are controlled or eliminated.          Quality of Life Scores:  Scores of 19 and below usually indicate a poorer quality of life in these areas.  A difference of  2-3 points is a clinically meaningful difference.  A difference of 2-3 points in the total score of the Quality of Life Index has been associated with significant improvement in overall quality of life, self-image, physical symptoms, and general health in studies assessing change in quality of life.  PHQ-9: Review Flowsheet  More data exists      09/23/2023 01/15/2023 10/23/2021 09/20/2020 01/04/2019  Depression screen PHQ 2/9  Decreased Interest 0 1 0 0 0  Down, Depressed, Hopeless 0 1 0 0 0  PHQ - 2 Score 0 2 0 0 0  Altered sleeping 1 1 1  - 1  Tired, decreased energy 1 3 1  - 1  Change in appetite 0 2 1 - 1  Feeling bad or failure about yourself  0 0 0 - 1  Trouble concentrating 0 0 0 - 0  Moving slowly or fidgety/restless 0 0 0 - 0  Suicidal thoughts 0 0 0 - 0  PHQ-9 Score 2 8 3  - 4  Difficult doing work/chores Not difficult at all Very difficult - - -   Interpretation of Total Score  Total Score Depression Severity:  1-4 = Minimal depression, 5-9 = Mild depression, 10-14 = Moderate depression, 15-19 = Moderately severe depression, 20-27 = Severe depression   Psychosocial Evaluation and Intervention:  Psychosocial Evaluation - 09/23/23 1406       Psychosocial Evaluation & Interventions   Interventions Stress management education;Encouraged to exercise with the program and follow exercise prescription;Relaxation education    Comments Zaelynn denies any depression or history of depression. She states her daily stressors are her health and also family issues. She is currently taking psychotropic meds and denies any needs at this time. Staff will provide Bethsaida with stress reduction education.    Expected Outcomes For Vandy to participate in PR with less stress.     Continue Psychosocial Services  No Follow up required          Psychosocial Re-Evaluation:  Psychosocial Re-Evaluation     Row Name 09/30/23 1433             Psychosocial Re-Evaluation   Current issues with Current Stress Concerns       Comments Monthly psychosocial re-evaluation as follows: No changes from orientation. Tiny is scheduled to start the program tomorrow, 8/28       Expected Outcomes For Aniesha to participate in PR with less stress. To use positive coping mechanisms to decrease her stress       Interventions Encouraged to attend Pulmonary Rehabilitation for the exercise       Continue Psychosocial Services  No Follow up required          Psychosocial Discharge (Final Psychosocial Re-Evaluation):  Psychosocial Re-Evaluation - 09/30/23 1433       Psychosocial Re-Evaluation   Current issues with Current Stress Concerns    Comments Monthly psychosocial re-evaluation as follows: No changes from orientation. Kenyatta is scheduled to start the program tomorrow, 8/28    Expected Outcomes For Jonte to participate in PR with less stress. To use positive coping mechanisms to decrease her stress    Interventions Encouraged to attend Pulmonary Rehabilitation for the exercise  Continue Psychosocial Services  No Follow up required          Education: Education Goals: Education classes will be provided on a weekly basis, covering required topics. Participant will state understanding/return demonstration of topics presented.  Learning Barriers/Preferences:  Learning Barriers/Preferences - 09/23/23 1314       Learning Barriers/Preferences   Learning Barriers Sight    Learning Preferences None          General Pulmonary Education Topics:  Infection Prevention: - Provides verbal and written material to individual with discussion of infection control including proper hand washing and proper equipment cleaning during exercise session.   Falls Prevention: - Provides  verbal and written material to individual with discussion of falls prevention and safety.   Chronic Lung Disease Review: - Group verbal instruction with posters, models, PowerPoint presentations and videos,  to review new updates, new respiratory medications, new advancements in procedures and treatments. Providing information on websites and 800 numbers for continued self-education. Includes information about supplement oxygen, available portable oxygen systems, continuous and intermittent flow rates, oxygen safety, concentrators, and Medicare reimbursement for oxygen. Explanation of Pulmonary Drugs, including class, frequency, complications, importance of spacers, rinsing mouth after steroid MDI's, and proper cleaning methods for nebulizers. Review of basic lung anatomy and physiology related to function, structure, and complications of lung disease. Review of risk factors. Discussion about methods for diagnosing sleep apnea and types of masks and machines for OSA. Includes a review of the use of types of environmental controls: home humidity, furnaces, filters, dust mite/pet prevention, HEPA vacuums. Discussion about weather changes, air quality and the benefits of nasal washing. Instruction on Warning signs, infection symptoms, calling MD promptly, preventive modes, and value of vaccinations. Review of effective airway clearance, coughing and/or vibration techniques. Emphasizing that all should Create an Action Plan. Written material provided at class time.   AED/CPR: - Group verbal and written instruction with the use of models to demonstrate the basic use of the AED with the basic ABC's of resuscitation.    Tests and Procedures:  - Group verbal and visual presentation and models provide information about basic cardiac anatomy and function. Reviews the testing methods done to diagnose heart disease and the outcomes of the test results. Describes the treatment choices: Medical Management,  Angioplasty, or Coronary Bypass Surgery for treating various heart conditions including Myocardial Infarction, Angina, Valve Disease, and Cardiac Arrhythmias.  Written material provided at class time.   Medication Safety: - Group verbal and visual instruction to review commonly prescribed medications for heart and lung disease. Reviews the medication, class of the drug, and side effects. Includes the steps to properly store meds and maintain the prescription regimen.  Written material given at graduation.   Other: -Provides group and verbal instruction on various topics (see comments)   Knowledge Questionnaire Score:  Knowledge Questionnaire Score - 09/23/23 1409       Knowledge Questionnaire Score   Pre Score 16/18           Core Components/Risk Factors/Patient Goals at Admission:  Personal Goals and Risk Factors at Admission - 09/23/23 1314       Core Components/Risk Factors/Patient Goals on Admission   Improve shortness of breath with ADL's Yes    Intervention Provide education, individualized exercise plan and daily activity instruction to help decrease symptoms of SOB with activities of daily living.    Expected Outcomes Short Term: Improve cardiorespiratory fitness to achieve a reduction of symptoms when performing ADLs;Long Term: Be able to  perform more ADLs without symptoms or delay the onset of symptoms          Education:Diabetes - Individual verbal and written instruction to review signs/symptoms of diabetes, desired ranges of glucose level fasting, after meals and with exercise. Acknowledge that pre and post exercise glucose checks will be done for 3 sessions at entry of program.   Know Your Numbers and Heart Failure: - Group verbal and visual instruction to discuss disease risk factors for cardiac and pulmonary disease and treatment options.  Reviews associated critical values for Overweight/Obesity, Hypertension, Cholesterol, and Diabetes.  Discusses basics of  heart failure: signs/symptoms and treatments.  Introduces Heart Failure Zone chart for action plan for heart failure. Written material provided at class time.   Core Components/Risk Factors/Patient Goals Review:   Goals and Risk Factor Review     Row Name 09/30/23 1606             Core Components/Risk Factors/Patient Goals Review   Personal Goals Review Develop more efficient breathing techniques such as purse lipped breathing and diaphragmatic breathing and practicing self-pacing with activity.;Improve shortness of breath with ADL's       Review Monthly review of patient's Core Components/Risk Factors/Patient Goals are as follows: Unable to assess goals. Tishara hasn't started the program yet. She is scheduled to start tomorrow, 10/01/23.       Expected Outcomes Pt will show progress toward meeting expected goals and outcomes.          Core Components/Risk Factors/Patient Goals at Discharge (Final Review):   Goals and Risk Factor Review - 09/30/23 1606       Core Components/Risk Factors/Patient Goals Review   Personal Goals Review Develop more efficient breathing techniques such as purse lipped breathing and diaphragmatic breathing and practicing self-pacing with activity.;Improve shortness of breath with ADL's    Review Monthly review of patient's Core Components/Risk Factors/Patient Goals are as follows: Unable to assess goals. Donyel hasn't started the program yet. She is scheduled to start tomorrow, 10/01/23.    Expected Outcomes Pt will show progress toward meeting expected goals and outcomes.          ITP Comments: Pt is making expected progress toward Pulmonary Rehab goals after completing 1 session(s). Recommend continued exercise, life style modification, education, and utilization of breathing techniques to increase stamina and strength, while also decreasing shortness of breath with exertion.    Comments: Dr. Slater Staff is Medical Director for Pulmonary Rehab at [facility].

## 2023-10-08 ENCOUNTER — Telehealth (HOSPITAL_COMMUNITY): Payer: Self-pay

## 2023-10-08 ENCOUNTER — Encounter (HOSPITAL_COMMUNITY): Admission: RE | Admit: 2023-10-08 | Source: Ambulatory Visit

## 2023-10-08 NOTE — Telephone Encounter (Signed)
 Called pt to check on her since she missed PR class. No answer. Left VM.

## 2023-10-09 ENCOUNTER — Other Ambulatory Visit: Payer: Self-pay | Admitting: Internal Medicine

## 2023-10-09 NOTE — Telephone Encounter (Signed)
 I had Joellen double check but since we didn't eval pt and also pt is in pulmonary rehab we are not able to get O2 approved.

## 2023-10-13 ENCOUNTER — Telehealth: Payer: Self-pay

## 2023-10-13 ENCOUNTER — Encounter (HOSPITAL_COMMUNITY): Admission: RE | Admit: 2023-10-13 | Source: Ambulatory Visit

## 2023-10-13 ENCOUNTER — Inpatient Hospital Stay: Admission: RE | Admit: 2023-10-13 | Source: Ambulatory Visit

## 2023-10-13 NOTE — Telephone Encounter (Signed)
 Pt tests INR at home. LVM it is time to test INR.

## 2023-10-14 ENCOUNTER — Ambulatory Visit (INDEPENDENT_AMBULATORY_CARE_PROVIDER_SITE_OTHER): Payer: Self-pay

## 2023-10-14 ENCOUNTER — Telehealth (HOSPITAL_COMMUNITY): Payer: Self-pay

## 2023-10-14 DIAGNOSIS — Z7901 Long term (current) use of anticoagulants: Secondary | ICD-10-CM | POA: Diagnosis not present

## 2023-10-14 LAB — POCT INR: INR: 2.6 (ref 2.0–3.0)

## 2023-10-14 NOTE — Telephone Encounter (Signed)
 LVM

## 2023-10-14 NOTE — Progress Notes (Signed)
 Pt tests INR at home.  Pt reports she has started pulmonary rehab. She had her first assessment and is being prescribed oxygen. She is looking forward to this rehab.  Continue 1 1/2 tablet daily except take 2 tablets on Monday and Thursday. Recheck on 9/23.  LVM with dosing and recheck date.

## 2023-10-14 NOTE — Telephone Encounter (Signed)
 Received VM from [facility] yesterday. Returned call. Call was not answered so I left VM with department number.

## 2023-10-14 NOTE — Patient Instructions (Addendum)
 Pre visit review using our clinic review tool, if applicable. No additional management support is needed unless otherwise documented below in the visit note.  Continue 1 1/2 tablet daily except take 2 tablets on Monday and Thursday. Recheck on 9/23.

## 2023-10-15 ENCOUNTER — Encounter (HOSPITAL_COMMUNITY): Admission: RE | Admit: 2023-10-15 | Source: Ambulatory Visit

## 2023-10-15 ENCOUNTER — Telehealth (HOSPITAL_COMMUNITY): Payer: Self-pay | Admitting: *Deleted

## 2023-10-15 ENCOUNTER — Telehealth (HOSPITAL_COMMUNITY): Payer: Self-pay

## 2023-10-15 NOTE — Telephone Encounter (Signed)
 Called pt and LVM regarding plan for PR since she has not been feeling well.  Aliene Aris BS, ACSM-CEP 10/15/2023 3:32 PM

## 2023-10-15 NOTE — Telephone Encounter (Signed)
 Shalin returned call. Stated she can transfer to 8:15 class and start back at later time. Pt will start 10/7 in 8:15 class and graduate 12/23.

## 2023-10-20 ENCOUNTER — Encounter (HOSPITAL_COMMUNITY)

## 2023-10-22 ENCOUNTER — Encounter (HOSPITAL_COMMUNITY)

## 2023-10-23 NOTE — Addendum Note (Signed)
 Encounter addended by: Nicholaus Johnnie PARAS on: 10/23/2023 10:21 AM  Actions taken: Flowsheet data copied forward, Flowsheet accepted

## 2023-10-23 NOTE — Addendum Note (Signed)
 Encounter addended by: Tarisa Paola, RN on: 10/23/2023 11:06 AM  Actions taken: Flowsheet data copied forward, Flowsheet accepted

## 2023-10-27 ENCOUNTER — Telehealth: Payer: Self-pay

## 2023-10-27 ENCOUNTER — Encounter (HOSPITAL_COMMUNITY)

## 2023-10-27 NOTE — Telephone Encounter (Signed)
 Pt tests INR at home. It is time for pt to test. LVM requesting pt test INR.

## 2023-10-29 ENCOUNTER — Encounter (HOSPITAL_COMMUNITY)

## 2023-10-29 ENCOUNTER — Ambulatory Visit (INDEPENDENT_AMBULATORY_CARE_PROVIDER_SITE_OTHER): Payer: Self-pay

## 2023-10-29 DIAGNOSIS — Z7901 Long term (current) use of anticoagulants: Secondary | ICD-10-CM | POA: Diagnosis not present

## 2023-10-29 LAB — POCT INR: INR: 2 (ref 2.0–3.0)

## 2023-10-29 NOTE — Telephone Encounter (Signed)
 LVM it is time to test.

## 2023-10-29 NOTE — Patient Instructions (Addendum)
 Pre visit review using our clinic review tool, if applicable. No additional management support is needed unless otherwise documented below in the visit note.  Increase dose today to take 2 1/2 tablets and increase dose tomorrow to take 1 1/2 tablet daily except take 2 tablets on Monday, Wednesday and Friday. Recheck on 10/7.

## 2023-10-29 NOTE — Progress Notes (Signed)
 Pt tests INR at home.  Pt reports missing a dose about 6 days. Increase dose today to take 2 1/2 tablets and increase dose tomorrow to take 1 1/2 tablet daily except take 2 tablets on Monday, Wednesday and Friday. Recheck on 10/7.  Contacted pt and advised of dosing and recheck date. Pt verbalized understanding.

## 2023-11-03 ENCOUNTER — Encounter (HOSPITAL_COMMUNITY)

## 2023-11-04 NOTE — Progress Notes (Signed)
 Pulmonary Individual Treatment Plan  Patient Details  Name: [name] MRN: [id number] Date of Birth: [birth year] Referring Provider:   Conrad Ports Pulmonary Rehab Walk Test from 09/23/2023 in [facility]  Referring Provider Dr. Okey    Initial Encounter Date:  Flowsheet Row Pulmonary Rehab Walk Test from 09/23/2023 in [facility]  Date 09/23/23    Visit Diagnosis: OSA (obstructive sleep apnea)  Patient's Home Medications on Admission:   Current Outpatient Medications:    albuterol  (VENTOLIN  HFA) 108 (90 Base) MCG/ACT inhaler, Inhale 1-2 puffs into the lungs every 6 (six) hours as needed for wheezing or shortness of breath., Disp: 8.5 g, Rfl: 3   amoxicillin  (AMOXIL ) 500 MG tablet, TAKE 4 TABS (2000 MG) 30-60 MIN BEFORE DENTAL PROCEDURE., Disp: 4 tablet, Rfl: 2   aspirin  EC 81 MG EC tablet, Take 1 tablet (81 mg total) by mouth daily. Swallow whole., Disp: 30 tablet, Rfl: 11   benzonatate  (TESSALON ) 200 MG capsule, Take 1 capsule (200 mg total) by mouth 3 (three) times daily as needed. Swallow whole, Disp: 30 capsule, Rfl: 1   bismuth  subsalicylate (PEPTO BISMOL) 262 MG/15ML suspension, Take 30 mLs by mouth every 6 (six) hours as needed for indigestion or diarrhea or loose stools., Disp: , Rfl:    calcium  carbonate (TUMS - DOSED IN MG ELEMENTAL CALCIUM ) 500 MG chewable tablet, Chew 1-2 tablets by mouth 3 (three) times daily as needed for indigestion or heartburn., Disp: , Rfl:    cyanocobalamin  (VITAMIN B12) 1000 MCG/ML injection, Inject 1 mL (1,000 mcg total) into the muscle every 6 (six) weeks., Disp: 1 mL, Rfl: 2   cyclobenzaprine  (FLEXERIL ) 10 MG tablet, Take 1 tablet (10 mg total) by mouth 3 (three) times daily as needed., Disp: 15 tablet, Rfl: 0   ezetimibe  (ZETIA ) 10 MG tablet, TAKE 1 TABLET BY MOUTH EVERY DAY, Disp: 90 tablet, Rfl: 0   furosemide  (LASIX ) 20 MG  tablet, Take 20 mg by mouth daily., Disp: , Rfl:    hydroxychloroquine  (PLAQUENIL ) 200 MG tablet, Take 200 mg by mouth 2 (two) times daily., Disp: , Rfl:    levothyroxine  (SYNTHROID ) 175 MCG tablet, TAKE 1 TABLET BY MOUTH DAILY BEFORE BREAKFAST., Disp: 90 tablet, Rfl: 1   metoprolol  tartrate (LOPRESSOR ) 50 MG tablet, Take 1 tablet (50 mg total) by mouth 2 (two) times daily. Patient need to call and schedule appointment for further refills second attempt, Disp: 15 tablet, Rfl: 0   Multiple Vitamins-Minerals (BARIATRIC MULTIVITAMINS/IRON ) CAPS, Take 1 tablet by mouth daily., Disp: , Rfl:    omeprazole  (PRILOSEC) 40 MG capsule, Take 1 capsule (40 mg total) by mouth daily., Disp: 90 capsule, Rfl: 3   PARoxetine  (PAXIL ) 40 MG tablet, TAKE 1 TABLET BY MOUTH EVERY DAY IN THE MORNING, Disp: 90 tablet, Rfl: 0   rosuvastatin  (CRESTOR ) 40 MG tablet, TAKE 1 TABLET BY MOUTH EVERY DAY, Disp: 15 tablet, Rfl: 0   tirzepatide  (ZEPBOUND ) 2.5 MG/0.5ML Pen, Inject 2.5 mg into the skin once a week. 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   HLD (hyperlipidemia)    Iron  deficiency anemia    IV dextran [place]   Nephritis and nephropathy, not specified as acute or chronic, with unspecified pathological lesion  in kidney    Nonspecific abnormal results of liver function study    Obesity, unspecified    Other specified acquired hypothyroidism    Panic disorder without agoraphobia    Personal history of venous thrombosis and embolism 2002   during pregnancy; ?factor 5 leiden (sees heme)   Pleural effusion 2005   c/w lupus initial w/u; recurrent Right as pred tapered off July 2011   Sleep apnea    Systemic lupus erythematosus (HCC)    renal GN, hx of pericardial eff in late 90's   Unspecified essential hypertension     Tobacco Use: Social History   Tobacco Use  Smoking Status Former   Current packs/day: 0.00   Average packs/day: 0.5 packs/day for 10.0 years (5.0 ttl pk-yrs)   Types: Cigarettes   Start date: 05/05/1999   Quit date: 05/04/2009   Years since quitting: 14.5  Smokeless Tobacco Never  Tobacco Comments   Socially x10 years (1 pack/month)    Labs: Review Flowsheet  More data exists      Latest Ref Rng & Units 07/06/2020 09/07/2020 10/16/2021 01/15/2023 07/15/2023  Labs for ITP Cardiac and Pulmonary Rehab  Cholestrol 0 - 200 mg/dL 814  836  855  838  -  LDL (calc) 0 - 99 mg/dL 890  91  71  84  -  HDL-C >39.00 mg/dL 57  57  48.69  44.59  -  Trlycerides 0.0 - 149.0 mg/dL 894  79  892.9  890.9  -  Hemoglobin A1c <5.7 % - - 6.3  6.4  6.3     Capillary Blood Glucose: Lab Results  Component Value Date   GLUCAP 104 (H) 04/09/2020   GLUCAP 99 04/09/2020   GLUCAP 88 04/09/2020   GLUCAP 140 (H) 04/08/2020   GLUCAP 108 (H) 04/08/2020     Pulmonary Assessment Scores:  Pulmonary Assessment Scores     Row Name 09/23/23 1305         ADL UCSD   ADL Phase Entry     SOB Score total 63       CAT Score   CAT Score 22       mMRC Score   mMRC Score 4       UCSD: Self-administered rating of dyspnea associated with activities of daily living (ADLs) 6-point scale (0 = not at all to 5 = maximal or unable to do because of breathlessness)  Scoring Scores range from 0 to 120.   Minimally important difference is 5 units  CAT: CAT can identify the health impairment of COPD patients and is better correlated with disease progression.  CAT has a scoring range of zero to 40. The CAT score is classified into four groups of low (less than 10), medium (10 - 20), high (21-30) and very high (31-40) based on the impact level of disease on  health status. A CAT score over 10 suggests significant symptoms.  A worsening CAT score could be explained by an exacerbation, poor medication adherence, poor inhaler technique, or progression of COPD or comorbid conditions.  CAT MCID is 2 points  mMRC: mMRC (Modified Medical Research Council) Dyspnea Scale is used to assess the degree of baseline functional disability in patients of respiratory disease due to dyspnea. No minimal important difference is established. A decrease in score of 1 point or greater is considered a positive change.   Pulmonary Function Assessment:  Pulmonary Function Assessment - 09/23/23 1318       Breath   Bilateral Breath Sounds Clear;Decreased    Shortness of Breath Yes;Limiting activity;Fear of Shortness of Breath          Exercise Target Goals: Exercise Program Goal: Individual exercise prescription set using results from initial 6 min walk test and THRR while considering  patient's activity barriers and safety.   Exercise Prescription Goal: Initial exercise prescription builds to 30-45 minutes a day of aerobic activity, 2-3 days per week.  Home exercise guidelines will be given to patient during program as part of exercise prescription that the participant will acknowledge.  Activity Barriers & Risk Stratification:  Activity Barriers & Cardiac Risk Stratification - 09/23/23 1316       Activity Barriers & Cardiac Risk Stratification   Activity Barriers Deconditioning;Muscular Weakness;Shortness of Breath;Arthritis    Cardiac Risk Stratification Moderate          6 Minute Walk:  6 Minute Walk      Row Name 09/23/23 1411         6 Minute Walk   Phase Initial     Distance 618 feet     Walk Time 6 minutes     # of Rest Breaks 2  2:50-3:13; 4:00-4:20     MPH 1.17     METS 0.58     RPE 11     Perceived Dyspnea  1     VO2 Peak 2.03     Symptoms No     Resting HR 68 bpm     Resting BP 144/64     Resting Oxygen Saturation  94 %     Exercise Oxygen Saturation  during 6 min walk 85 %     Max Ex. HR 85 bpm     Max Ex. BP 160/70     2 Minute Post BP 142/70       Interval HR   1 Minute HR 85     2 Minute HR 80     3 Minute HR 72     4 Minute HR 84     5 Minute HR 82     6 Minute HR 87     2 Minute Post HR 69     Interval Heart Rate? Yes       Interval Oxygen   Interval Oxygen? Yes     Baseline Oxygen Saturation % 94 %     1 Minute Oxygen Saturation % 92 %     1 Minute Liters of Oxygen 0 L     2 Minute Oxygen Saturation % 93 %     2 Minute Liters of Oxygen 0 L     3 Minute Oxygen Saturation % 90 %     3 Minute Liters of Oxygen 0 L     4 Minute Oxygen Saturation % 85 %     4 Minute Liters of Oxygen 0 L  placed on 2L     5 Minute Oxygen Saturation % 93 %     5 Minute Liters of Oxygen 2 L     6 Minute Oxygen Saturation % 91 %     6 Minute Liters of Oxygen 2 L     2 Minute Post Oxygen Saturation % 98 %     2 Minute Post Liters of Oxygen 2 L        Oxygen Initial Assessment:  Oxygen Initial Assessment - 09/23/23 1317       Home Oxygen   Home Oxygen Device None    Sleep Oxygen Prescription CPAP    Home Exercise Oxygen Prescription None    Home Resting Oxygen Prescription None      Initial 6 min Walk   Oxygen Used Continuous    Liters per minute 2      Program Oxygen Prescription   Program Oxygen Prescription Continuous    Liters per minute 2      Intervention   Short Term Goals To learn and understand importance of monitoring SPO2 with pulse oximeter and demonstrate accurate use of the pulse oximeter.;To learn and understand importance of maintaining  oxygen saturations>88%;To learn and demonstrate proper pursed lip breathing techniques or other breathing techniques. ;To learn and demonstrate proper use of respiratory medications;To learn and exhibit compliance with exercise, home and travel O2 prescription    Long  Term Goals Maintenance of O2 saturations>88%;Compliance with respiratory medication;Verbalizes importance of monitoring SPO2 with pulse oximeter and return demonstration;Exhibits proper breathing techniques, such as pursed lip breathing or other method taught during program session;Demonstrates proper use of MDI's;Exhibits compliance with exercise, home  and travel O2 prescription          Oxygen Re-Evaluation:  Oxygen Re-Evaluation     Row Name 09/29/23 0846 10/23/23 1021           Program Oxygen Prescription   Program Oxygen Prescription Continuous Continuous      Liters per minute 2 2        Home Oxygen   Home Oxygen Device None None      Sleep Oxygen Prescription CPAP CPAP      Home Exercise Oxygen Prescription None None      Home Resting Oxygen Prescription None None        Goals/Expected Outcomes   Short Term Goals To learn and understand importance of monitoring SPO2 with pulse oximeter and demonstrate accurate use of the pulse oximeter.;To learn and understand importance of maintaining oxygen saturations>88%;To learn and demonstrate proper pursed lip breathing techniques or other breathing techniques. ;To learn and demonstrate proper use of respiratory medications;To learn and exhibit compliance with exercise, home and travel O2 prescription To learn and understand importance of monitoring SPO2 with pulse oximeter and demonstrate accurate use of the pulse oximeter.;To learn and understand importance of maintaining oxygen saturations>88%;To learn and demonstrate proper pursed lip breathing techniques or other breathing techniques. ;To learn and demonstrate proper use of respiratory medications;To learn and exhibit  compliance with exercise, home and travel O2 prescription      Long  Term Goals Maintenance of O2 saturations>88%;Compliance with respiratory medication;Verbalizes importance of monitoring SPO2 with pulse oximeter and return demonstration;Exhibits proper breathing techniques, such as pursed lip breathing or other method taught during program session;Demonstrates proper use of MDI's;Exhibits compliance with exercise, home  and travel O2 prescription Maintenance of O2 saturations>88%;Compliance with respiratory medication;Verbalizes importance of monitoring SPO2 with pulse oximeter and return demonstration;Exhibits proper breathing techniques, such  as pursed lip breathing or other method taught during program session;Demonstrates proper use of MDI's;Exhibits compliance with exercise, home  and travel O2 prescription      Goals/Expected Outcomes Compliance and understanding of oxygen saturation monitoring and breathing techniques to decrease shortness of breath. Compliance and understanding of oxygen saturation monitoring and breathing techniques to decrease shortness of breath.         Oxygen Discharge (Final Oxygen Re-Evaluation):  Oxygen Re-Evaluation - 10/23/23 1021       Program Oxygen Prescription   Program Oxygen Prescription Continuous    Liters per minute 2      Home Oxygen   Home Oxygen Device None    Sleep Oxygen Prescription CPAP    Home Exercise Oxygen Prescription None    Home Resting Oxygen Prescription None      Goals/Expected Outcomes   Short Term Goals To learn and understand importance of monitoring SPO2 with pulse oximeter and demonstrate accurate use of the pulse oximeter.;To learn and understand importance of maintaining oxygen saturations>88%;To learn and demonstrate proper pursed lip breathing techniques or other breathing techniques. ;To learn and demonstrate proper use of respiratory medications;To learn and exhibit compliance with exercise, home and travel O2 prescription     Long  Term Goals Maintenance of O2 saturations>88%;Compliance with respiratory medication;Verbalizes importance of monitoring SPO2 with pulse oximeter and return demonstration;Exhibits proper breathing techniques, such as pursed lip breathing or other method taught during program session;Demonstrates proper use of MDI's;Exhibits compliance with exercise, home  and travel O2 prescription    Goals/Expected Outcomes Compliance and understanding of oxygen saturation monitoring and breathing techniques to decrease shortness of breath.          Initial Exercise Prescription:  Initial Exercise Prescription - 09/23/23 1400       Date of Initial Exercise RX and Referring Provider   Date 09/23/23    Referring Provider Dr. Okey    Expected Discharge Date 12/22/23      Oxygen   Oxygen Continuous    Liters 2    Maintain Oxygen Saturation 88% or higher      T5 Nustep   Level 1    SPM 70    Minutes 15    METs 1.5      Track   Laps 5    Minutes 15    METs 1.7      Prescription Details   Frequency (times per week) 2    Duration Progress to 30 minutes of continuous aerobic without signs/symptoms of physical distress      Intensity   THRR 40-80% of Max Heartrate 65-131    Ratings of Perceived Exertion 11-13    Perceived Dyspnea 0-4      Progression   Progression Continue to progress workloads to maintain intensity without signs/symptoms of physical distress.      Resistance Training   Training Prescription Yes    Weight red bands    Reps 10-15          Perform Capillary Blood Glucose checks as needed.  Exercise Prescription Changes:   Exercise Prescription Changes     Row Name 10/01/23 1451             Response to Exercise   Blood Pressure (Admit) 124/70       Blood Pressure (Exercise) 124/76       Blood Pressure (Exit) 110/60       Heart Rate (Admit) 63 bpm       Heart Rate (Exercise) 78  bpm       Heart Rate (Exit) 64 bpm       Oxygen Saturation (Admit) 95 %        Oxygen Saturation (Exercise) 94 %       Oxygen Saturation (Exit) 96 %       Rating of Perceived Exertion (Exercise) 15       Perceived Dyspnea (Exercise) 3       Duration Progress to 30 minutes of  aerobic without signs/symptoms of physical distress       Intensity THRR unchanged         Progression   Progression Continue to progress workloads to maintain intensity without signs/symptoms of physical distress.         Resistance Training   Training Prescription Yes       Weight red bands       Reps 10-15       Time 10 Minutes         Oxygen   Oxygen Continuous       Liters 2         NuStep   Level 1       Minutes 15       METs 1.9         Track   Laps 5       Minutes 15       METs 2.08         Oxygen   Maintain Oxygen Saturation 88% or higher          Exercise Comments:   Exercise Goals and Review:   Exercise Goals     Row Name 09/23/23 1305             Exercise Goals   Increase Physical Activity Yes       Intervention Provide advice, education, support and counseling about physical activity/exercise needs.;Develop an individualized exercise prescription for aerobic and resistive training based on initial evaluation findings, risk stratification, comorbidities and participant's personal goals.       Expected Outcomes Short Term: Attend rehab on a regular basis to increase amount of physical activity.;Long Term: Add in home exercise to make exercise part of routine and to increase amount of physical activity.;Long Term: Exercising regularly at least 3-5 days a week.       Increase Strength and Stamina Yes       Intervention Provide advice, education, support and counseling about physical activity/exercise needs.;Develop an individualized exercise prescription for aerobic and resistive training based on initial evaluation findings, risk stratification, comorbidities and participant's personal goals.       Expected Outcomes Short Term: Increase workloads from  initial exercise prescription for resistance, speed, and METs.;Short Term: Perform resistance training exercises routinely during rehab and add in resistance training at home;Long Term: Improve cardiorespiratory fitness, muscular endurance and strength as measured by increased METs and functional capacity ( )       Able to understand and use rate of perceived exertion (RPE) scale Yes       Intervention Provide education and explanation on how to use RPE scale       Expected Outcomes Short Term: Able to use RPE daily in rehab to express subjective intensity level;Long Term:  Able to use RPE to guide intensity level when exercising independently       Able to understand and use Dyspnea scale Yes       Intervention Provide education and explanation on how to use Dyspnea scale  Expected Outcomes Long Term: Able to use Dyspnea scale to guide intensity level when exercising independently;Short Term: Able to use Dyspnea scale daily in rehab to express subjective sense of shortness of breath during exertion       Knowledge and understanding of Target Heart Rate Range (THRR) Yes       Intervention Provide education and explanation of THRR including how the numbers were predicted and where they are located for reference       Expected Outcomes Short Term: Able to state/look up THRR;Long Term: Able to use THRR to govern intensity when exercising independently;Short Term: Able to use daily as guideline for intensity in rehab       Understanding of Exercise Prescription Yes       Intervention Provide education, explanation, and written materials on patient's individual exercise prescription       Expected Outcomes Short Term: Able to explain program exercise prescription;Long Term: Able to explain home exercise prescription to exercise independently          Exercise Goals Re-Evaluation :  Exercise Goals Re-Evaluation     Row Name 09/29/23 0844 10/23/23 1018           Exercise Goal Re-Evaluation    Exercise Goals Review Increase Physical Activity;Able to understand and use rate of perceived exertion (RPE) scale;Increase Strength and Stamina;Knowledge and understanding of Target Heart Rate Range (THRR);Understanding of Exercise Prescription;Able to understand and use Dyspnea scale Increase Physical Activity;Able to understand and use rate of perceived exertion (RPE) scale;Increase Strength and Stamina;Knowledge and understanding of Target Heart Rate Range (THRR);Understanding of Exercise Prescription;Able to understand and use Dyspnea scale      Comments Pt is scheduled to begin exercise 8/28. Will progress as tolerated. Aashvi has completed 1 exercise session. She exercises for 15 min on the track and Nustep. She averages 1.77 METs on the track and 1.9 METs at level 1 on the Nustep. She performs the warmup and cooldown standing/ seated dependent on her fatigue and shortness of breath. It is too soon to notate any discernable progressions. She has missed most of her exercise sessions due to illness and other commitments. I am unsure how motivated she is to exercise in the program. Will continue to monitor and progress as able.      Expected Outcomes Through exercise at rehab and home, the patient will decrease shortness of breath with daily activities and feel confident in carrying out an exercise regimen at home. Through exercise at rehab and home, the patient will decrease shortness of breath with daily activities and feel confident in carrying out an exercise regimen at home.         Discharge Exercise Prescription (Final Exercise Prescription Changes):  Exercise Prescription Changes - 10/01/23 1451       Response to Exercise   Blood Pressure (Admit) 124/70    Blood Pressure (Exercise) 124/76    Blood Pressure (Exit) 110/60    Heart Rate (Admit) 63 bpm    Heart Rate (Exercise) 78 bpm    Heart Rate (Exit) 64 bpm    Oxygen Saturation (Admit) 95 %    Oxygen Saturation (Exercise) 94 %     Oxygen Saturation (Exit) 96 %    Rating of Perceived Exertion (Exercise) 15    Perceived Dyspnea (Exercise) 3    Duration Progress to 30 minutes of  aerobic without signs/symptoms of physical distress    Intensity THRR unchanged      Progression   Progression Continue to progress  workloads to maintain intensity without signs/symptoms of physical distress.      Resistance Training   Training Prescription Yes    Weight red bands    Reps 10-15    Time 10 Minutes      Oxygen   Oxygen Continuous    Liters 2      NuStep   Level 1    Minutes 15    METs 1.9      Track   Laps 5    Minutes 15    METs 2.08      Oxygen   Maintain Oxygen Saturation 88% or higher          Nutrition:  Target Goals: Understanding of nutrition guidelines, daily intake of sodium 1500mg , cholesterol 200mg , calories 30% from fat and 7% or less from saturated fats, daily to have 5 or more servings of fruits and vegetables.  Biometrics:  Pre Biometrics - 09/23/23 1419       Pre Biometrics   Grip Strength 22 kg           Nutrition Therapy Plan and Nutrition Goals:   Nutrition Assessments:  MEDIFICTS Score Key: >=70 Need to make dietary changes  40-70 Heart Healthy Diet <= 40 Therapeutic Level Cholesterol Diet  Flowsheet Row CARDIAC REHAB PHASE II EXERCISE from 09/24/2020 in [facility]  Picture Your Plate Total Score on Admission 65   Picture Your Plate Scores: <59 Unhealthy dietary pattern with much room for improvement. 41-50 Dietary pattern unlikely to meet recommendations for good health and room for improvement. 51-60 More healthful dietary pattern, with some room for improvement.  >60 Healthy dietary pattern, although there may be some specific behaviors that could be improved.    Nutrition Goals Re-Evaluation:   Nutrition Goals Discharge (Final Nutrition Goals Re-Evaluation):   Psychosocial: Target Goals:  Acknowledge presence or absence of significant depression and/or stress, maximize coping skills, provide positive support system. Participant is able to verbalize types and ability to use techniques and skills needed for reducing stress and depression.  Initial Review & Psychosocial Screening:  Initial Psych Review & Screening - 09/23/23 1312       Initial Review   Current issues with Current Psychotropic Meds;Current Stress Concerns    Source of Stress Concerns Chronic Illness;Family    Comments Armanda states her daily stressors are her health and family issues      Family Dynamics   Good Support System? Yes      Barriers   Psychosocial barriers to participate in program The patient should benefit from training in stress management and relaxation.      Screening Interventions   Interventions Encouraged to exercise;To provide support and resources with identified psychosocial needs    Expected Outcomes Short Term goal: Utilizing psychosocial counselor, staff and physician to assist with identification of specific Stressors or current issues interfering with healing process. Setting desired goal for each stressor or current issue identified.;Long Term Goal: Stressors or current issues are controlled or eliminated.          Quality of Life Scores:  Scores of 19 and below usually indicate a poorer quality of life in these areas.  A difference of  2-3 points is a clinically meaningful difference.  A difference of 2-3 points in the total score of the Quality of Life Index has been associated with significant improvement in overall quality of life, self-image, physical symptoms, and general health in studies assessing change  in quality of life.  PHQ-9: Review Flowsheet  More data exists      09/23/2023 01/15/2023 10/23/2021 09/20/2020 01/04/2019  Depression screen PHQ 2/9  Decreased Interest 0 1 0 0 0  Down, Depressed, Hopeless 0 1 0 0 0  PHQ - 2 Score 0 2 0 0 0  Altered sleeping 1 1 1  - 1   Tired, decreased energy 1 3 1  - 1  Change in appetite 0 2 1 - 1  Feeling bad or failure about yourself  0 0 0 - 1  Trouble concentrating 0 0 0 - 0  Moving slowly or fidgety/restless 0 0 0 - 0  Suicidal thoughts 0 0 0 - 0  PHQ-9 Score 2 8 3  - 4  Difficult doing work/chores Not difficult at all Very difficult - - -   Interpretation of Total Score  Total Score Depression Severity:  1-4 = Minimal depression, 5-9 = Mild depression, 10-14 = Moderate depression, 15-19 = Moderately severe depression, 20-27 = Severe depression   Psychosocial Evaluation and Intervention:  Psychosocial Evaluation - 09/23/23 1406       Psychosocial Evaluation & Interventions   Interventions Stress management education;Encouraged to exercise with the program and follow exercise prescription;Relaxation education    Comments Tilia denies any depression or history of depression. She states her daily stressors are her health and also family issues. She is currently taking psychotropic meds and denies any needs at this time. Staff will provide Lauralie with stress reduction education.    Expected Outcomes For Adeli to participate in PR with less stress.    Continue Psychosocial Services  No Follow up required          Psychosocial Re-Evaluation:  Psychosocial Re-Evaluation     Row Name 09/30/23 1433 10/23/23 1104           Psychosocial Re-Evaluation   Current issues with Current Stress Concerns Current Stress Concerns      Comments Monthly psychosocial re-evaluation as follows: No changes from orientation. Tae is scheduled to start the program tomorrow, 8/28 Monthly psychosocial re-evaluation as follows: Shivangi has completed 1 class. She has not been back for 3 weeks d/t illness. When she returns we will continue to support her as needed.      Expected Outcomes For Keller to participate in PR with less stress. To use positive coping mechanisms to decrease her stress For Roberto to participate in PR with less stress. To use  positive coping mechanisms to decrease her stress      Interventions Encouraged to attend Pulmonary Rehabilitation for the exercise Encouraged to attend Pulmonary Rehabilitation for the exercise      Continue Psychosocial Services  No Follow up required Follow up required by staff         Psychosocial Discharge (Final Psychosocial Re-Evaluation):  Psychosocial Re-Evaluation - 10/23/23 1104       Psychosocial Re-Evaluation   Current issues with Current Stress Concerns    Comments Monthly psychosocial re-evaluation as follows: Queen has completed 1 class. She has not been back for 3 weeks d/t illness. When she returns we will continue to support her as needed.    Expected Outcomes For Angeles to participate in PR with less stress. To use positive coping mechanisms to decrease her stress    Interventions Encouraged to attend Pulmonary Rehabilitation for the exercise    Continue Psychosocial Services  Follow up required by staff          Education: Education Goals: Education classes will  be provided on a weekly basis, covering required topics. Participant will state understanding/return demonstration of topics presented.  Learning Barriers/Preferences:  Learning Barriers/Preferences - 09/23/23 1314       Learning Barriers/Preferences   Learning Barriers Sight    Learning Preferences None          Education Topics: Know Your Numbers Group instruction that is supported by a PowerPoint presentation. Instructor discusses importance of knowing and understanding resting, exercise, and post-exercise oxygen saturation, heart rate, and blood pressure. Oxygen saturation, heart rate, blood pressure, rating of perceived exertion, and dyspnea are reviewed along with a normal range for these values.    Exercise for the Pulmonary Patient Group instruction that is supported by a PowerPoint presentation. Instructor discusses benefits of exercise, core components of exercise, frequency, duration, and  intensity of an exercise routine, importance of utilizing pulse oximetry during exercise, safety while exercising, and options of places to exercise outside of rehab.    MET Level  Group instruction provided by PowerPoint, verbal discussion, and written material to support subject matter. Instructor reviews what METs are and how to increase METs.    Pulmonary Medications Verbally interactive group education provided by instructor with focus on inhaled medications and proper administration.   Anatomy and Physiology of the Respiratory System Group instruction provided by PowerPoint, verbal discussion, and written material to support subject matter. Instructor reviews respiratory cycle and anatomical components of the respiratory system and their functions. Instructor also reviews differences in obstructive and restrictive respiratory diseases with examples of each.  Flowsheet Row PULMONARY REHAB OTHER RESPIRATORY from 10/01/2023 in [facility]  Date 10/01/23  Educator RT  Instruction Review Code 1- Verbalizes Understanding    Oxygen Safety Group instruction provided by PowerPoint, verbal discussion, and written material to support subject matter. There is an overview of "What is Oxygen" and "Why do we need it".  Instructor also reviews how to create a safe environment for oxygen use, the importance of using oxygen as prescribed, and the risks of noncompliance. There is a brief discussion on traveling with oxygen and resources the patient may utilize.   Oxygen Use Group instruction provided by PowerPoint, verbal discussion, and written material to discuss how supplemental oxygen is prescribed and different types of oxygen supply systems. Resources for more information are provided.    Breathing Techniques Group instruction that is supported by demonstration and informational handouts. Instructor discusses the benefits of pursed lip  and diaphragmatic breathing and detailed demonstration on how to perform both.     Risk Factor Reduction Group instruction that is supported by a PowerPoint presentation. Instructor discusses the definition of a risk factor, different risk factors for pulmonary disease, and how the heart and lungs work together.   Pulmonary Diseases Group instruction provided by PowerPoint, verbal discussion, and written material to support subject matter. Instructor gives an overview of the different type of pulmonary diseases. There is also a discussion on risk factors and symptoms as well as ways to manage the diseases.   Stress and Energy Conservation Group instruction provided by PowerPoint, verbal discussion, and written material to support subject matter. Instructor gives an overview of stress and the impact it can have on the body. Instructor also reviews ways to reduce stress. There is also a discussion on energy conservation and ways to conserve energy throughout the day.   Warning Signs and Symptoms Group instruction provided by PowerPoint, verbal discussion, and written material to support subject  matter. Instructor reviews warning signs and symptoms of stroke, heart attack, cold and flu. Instructor also reviews ways to prevent the spread of infection.   Other Education Group or individual verbal, written, or video instructions that support the educational goals of the pulmonary rehab program.    Knowledge Questionnaire Score:  Knowledge Questionnaire Score - 09/23/23 1409       Knowledge Questionnaire Score   Pre Score 16/18          Core Components/Risk Factors/Patient Goals at Admission:  Personal Goals and Risk Factors at Admission - 09/23/23 1314       Core Components/Risk Factors/Patient Goals on Admission   Improve shortness of breath with ADL's Yes    Intervention Provide education, individualized exercise plan and daily activity instruction to help decrease symptoms of SOB  with activities of daily living.    Expected Outcomes Short Term: Improve cardiorespiratory fitness to achieve a reduction of symptoms when performing ADLs;Long Term: Be able to perform more ADLs without symptoms or delay the onset of symptoms          Core Components/Risk Factors/Patient Goals Review:   Goals and Risk Factor Review     Row Name 09/30/23 1606 10/23/23 1106           Core Components/Risk Factors/Patient Goals Review   Personal Goals Review Develop more efficient breathing techniques such as purse lipped breathing and diaphragmatic breathing and practicing self-pacing with activity.;Improve shortness of breath with ADL's Develop more efficient breathing techniques such as purse lipped breathing and diaphragmatic breathing and practicing self-pacing with activity.;Improve shortness of breath with ADL's      Review Monthly review of patient's Core Components/Risk Factors/Patient Goals are as follows: Unable to assess goals. Kyndall hasn't started the program yet. She is scheduled to start tomorrow, 10/01/23. Monthly review of patient's Core Components/Risk Factors/Patient Goals are as follows: Unable to assess goals. Dakari has completed 1 session. She has not been here in 3 weeks d/t illness.      Expected Outcomes Pt will show progress toward meeting expected goals and outcomes. Pt will show progress toward meeting expected goals and outcomes.         Core Components/Risk Factors/Patient Goals at Discharge (Final Review):   Goals and Risk Factor Review - 10/23/23 1106       Core Components/Risk Factors/Patient Goals Review   Personal Goals Review Develop more efficient breathing techniques such as purse lipped breathing and diaphragmatic breathing and practicing self-pacing with activity.;Improve shortness of breath with ADL's    Review Monthly review of patient's Core Components/Risk Factors/Patient Goals are as follows: Unable to assess goals. Quinesha has completed 1 session. She  has not been here in 3 weeks d/t illness.    Expected Outcomes Pt will show progress toward meeting expected goals and outcomes.          ITP Comments: Pt is making expected progress toward Pulmonary Rehab goals after completing 1 session(s). Recommend continued exercise, life style modification, education, and utilization of breathing techniques to increase stamina and strength, while also decreasing shortness of breath with exertion.  Dr. Slater Staff is Medical Director for Pulmonary Rehab at [facility].

## 2023-11-04 NOTE — Addendum Note (Signed)
 Encounter addended by: Nicholaus Johnnie PARAS on: 11/04/2023 8:31 AM  Actions taken: Clinical Note Signed

## 2023-11-05 ENCOUNTER — Encounter (HOSPITAL_COMMUNITY)

## 2023-11-09 ENCOUNTER — Telehealth (HOSPITAL_COMMUNITY): Payer: Self-pay

## 2023-11-09 NOTE — Telephone Encounter (Signed)
 Patient left message returning Mary's call, stated her daughter and grandson have been sick and she's not feeling well either, so she will push off coming to class until 10/14. Cancelled her PR appts for this week.

## 2023-11-09 NOTE — Telephone Encounter (Signed)
 LVM for pt confirming appt for tomorrow at 8am.

## 2023-11-10 ENCOUNTER — Telehealth: Payer: Self-pay

## 2023-11-10 ENCOUNTER — Encounter (HOSPITAL_COMMUNITY)

## 2023-11-10 NOTE — Telephone Encounter (Signed)
 Pt tests INR at home. LVM it is time to test.

## 2023-11-11 ENCOUNTER — Other Ambulatory Visit: Payer: Self-pay | Admitting: Internal Medicine

## 2023-11-11 ENCOUNTER — Encounter: Payer: Self-pay | Admitting: Internal Medicine

## 2023-11-11 MED ORDER — METOPROLOL TARTRATE 50 MG PO TABS: 50.0000 mg | ORAL_TABLET | Freq: Two times a day (BID) | ORAL | 0 refills | Status: DC

## 2023-11-11 NOTE — Telephone Encounter (Signed)
 LVM to test INR

## 2023-11-12 ENCOUNTER — Other Ambulatory Visit: Payer: Self-pay | Admitting: Family Medicine

## 2023-11-12 ENCOUNTER — Encounter (HOSPITAL_COMMUNITY)

## 2023-11-12 ENCOUNTER — Ambulatory Visit (INDEPENDENT_AMBULATORY_CARE_PROVIDER_SITE_OTHER): Payer: Self-pay

## 2023-11-12 DIAGNOSIS — Z7901 Long term (current) use of anticoagulants: Secondary | ICD-10-CM | POA: Diagnosis not present

## 2023-11-12 LAB — POCT INR: INR: 4.2 — AB (ref 2.0–3.0)

## 2023-11-12 NOTE — Progress Notes (Signed)
 Pt tests INR at home.  Hold warfarin today and then change weekly dose to take 1 1/2 tablets daily except take 2 tablets on Monday and Friday. Recheck on 10/21.  LV and advised of dosing and recheck date.

## 2023-11-12 NOTE — Telephone Encounter (Signed)
 LVM to test INR

## 2023-11-12 NOTE — Telephone Encounter (Signed)
 Pt is compliant with warfarin management and PCP apts.  Sent in refill of warfarin to requested pharmacy.  Pt is not consistently compliant with warfarin management and has to be called several times to remind her it is time to test her INR. She is told when given instructions for dosing what the next testing date is but multiple calls have to be made before pt tests. May need to withdraw home testing if pattern continues.   Will send in warfarin for 90 day supply with no refills.

## 2023-11-12 NOTE — Patient Instructions (Addendum)
 Pre visit review using our clinic review tool, if applicable. No additional management support is needed unless otherwise documented below in the visit note.  Hold warfarin today and then change weekly dose to take 1 1/2 tablets daily except take 2 tablets on Monday and Friday. Recheck on 10/21.

## 2023-11-17 ENCOUNTER — Telehealth (HOSPITAL_COMMUNITY): Payer: Self-pay

## 2023-11-17 ENCOUNTER — Encounter (HOSPITAL_COMMUNITY)

## 2023-11-17 ENCOUNTER — Encounter (HOSPITAL_COMMUNITY)
Admission: RE | Admit: 2023-11-17 | Discharge: 2023-11-17 | Disposition: A | Discharge status: Home / Self Care | Source: Ambulatory Visit | Attending: Internal Medicine | Admitting: Internal Medicine

## 2023-11-17 DIAGNOSIS — G4733 Obstructive sleep apnea (adult) (pediatric): Secondary | ICD-10-CM | POA: Insufficient documentation

## 2023-11-17 NOTE — Telephone Encounter (Signed)
 Received voicemail that pt will be absent from class. Pt requested to start 10/21 and asked to be called back.

## 2023-11-17 NOTE — Telephone Encounter (Signed)
 Returned pt's call. Pt is requesting to start 10/21. I told Alyene that she hasn't attended class in over a month and recommended discharge. Noralyn stated she does have a lot going on right now. She agreed to be discharged from Pulmonary Rehab. Zanaria does understand how to get a new referral for PR.

## 2023-11-18 NOTE — Progress Notes (Signed)
 Discharge Progress Report  Patient Details  Name: [name] MRN: [id number] Date of Birth: 1965-07-23 Referring Provider:   [facility] Walk Test from 09/23/2023 in [facility]  Referring Provider Dr. Okey     Number of Visits: 1  Reason for Discharge:  Early Exit:  Lack of attendance  Smoking History:  Social History   Tobacco Use  Smoking Status Former   Current packs/day: 0.00   Average packs/day: 0.5 packs/day for 10.0 years (5.0 ttl pk-yrs)   Types: Cigarettes   Start date: 05/05/1999   Quit date: 05/04/2009   Years since quitting: 14.5  Smokeless Tobacco Never  Tobacco Comments   Socially x10 years (1 pack/month)    Diagnosis:  OSA (obstructive sleep apnea)  ADL UCSD:  Pulmonary Assessment Scores     Row Name 09/23/23 1305         ADL UCSD   ADL Phase Entry     SOB Score total 63       CAT Score   CAT Score 22       mMRC Score   mMRC Score 4        Initial Exercise Prescription:  Initial Exercise Prescription - 09/23/23 1400       Date of Initial Exercise RX and Referring Provider   Date 09/23/23    Referring Provider Dr. Okey    Expected Discharge Date 12/22/23      Oxygen   Oxygen Continuous    Liters 2    Maintain Oxygen Saturation 88% or higher      T5 Nustep   Level 1    SPM 70    Minutes 15    METs 1.5      Track   Laps 5    Minutes 15    METs 1.7      Prescription Details   Frequency (times per week) 2    Duration Progress to 30 minutes of continuous aerobic without signs/symptoms of physical distress      Intensity   THRR 40-80% of Max Heartrate 65-131    Ratings of Perceived Exertion 11-13    Perceived Dyspnea 0-4      Progression   Progression Continue to progress workloads to maintain intensity without signs/symptoms of physical distress.      Resistance Training   Training Prescription Yes    Weight red bands    Reps 10-15           Discharge Exercise Prescription (Final Exercise Prescription Changes):  Exercise Prescription Changes - 10/01/23 1451       Response to Exercise   Blood Pressure (Admit) 124/70    Blood Pressure (Exercise) 124/76    Blood Pressure (Exit) 110/60    Heart Rate (Admit) 63 bpm    Heart Rate (Exercise) 78 bpm    Heart Rate (Exit) 64 bpm    Oxygen Saturation (Admit) 95 %    Oxygen Saturation (Exercise) 94 %    Oxygen Saturation (Exit) 96 %    Rating of Perceived Exertion (Exercise) 15    Perceived Dyspnea (Exercise) 3    Duration Progress to 30 minutes of  aerobic without signs/symptoms of physical distress    Intensity THRR unchanged      Progression   Progression Continue to progress workloads to maintain intensity without signs/symptoms of physical distress.      Resistance Training   Training Prescription Yes    Weight  red bands    Reps 10-15    Time 10 Minutes      Oxygen   Oxygen Continuous    Liters 2      NuStep   Level 1    Minutes 15    METs 1.9      Track   Laps 5    Minutes 15    METs 2.08      Oxygen   Maintain Oxygen Saturation 88% or higher          Functional Capacity:  6 Minute Walk     Row Name 09/23/23 1411         6 Minute Walk   Phase Initial     Distance 618 feet     Walk Time 6 minutes     # of Rest Breaks 2  2:50-3:13; 4:00-4:20     MPH 1.17     METS 0.58     RPE 11     Perceived Dyspnea  1     VO2 Peak 2.03     Symptoms No     Resting HR 68 bpm     Resting BP 144/64     Resting Oxygen Saturation  94 %     Exercise Oxygen Saturation  during 6 min walk 85 %     Max Ex. HR 85 bpm     Max Ex. BP 160/70     2 Minute Post BP 142/70       Interval HR   1 Minute HR 85     2 Minute HR 80     3 Minute HR 72     4 Minute HR 84     5 Minute HR 82     6 Minute HR 87     2 Minute Post HR 69     Interval Heart Rate? Yes       Interval Oxygen   Interval Oxygen? Yes     Baseline Oxygen Saturation % 94 %     1 Minute  Oxygen Saturation % 92 %     1 Minute Liters of Oxygen 0 L     2 Minute Oxygen Saturation % 93 %     2 Minute Liters of Oxygen 0 L     3 Minute Oxygen Saturation % 90 %     3 Minute Liters of Oxygen 0 L     4 Minute Oxygen Saturation % 85 %     4 Minute Liters of Oxygen 0 L  placed on 2L     5 Minute Oxygen Saturation % 93 %     5 Minute Liters of Oxygen 2 L     6 Minute Oxygen Saturation % 91 %     6 Minute Liters of Oxygen 2 L     2 Minute Post Oxygen Saturation % 98 %     2 Minute Post Liters of Oxygen 2 L        Psychological, QOL, Others - Outcomes: PHQ 2/9:    09/23/2023    1:02 PM 01/15/2023   10:29 AM 10/23/2021   11:10 AM 09/20/2020   10:02 AM 01/04/2019    4:18 PM  Depression screen PHQ 2/9  Decreased Interest 0 1 0 0 0  Down, Depressed, Hopeless 0 1 0 0 0  PHQ - 2 Score 0 2 0 0 0  Altered sleeping 1 1 1  1   Tired, decreased energy 1 3 1   1  Change in appetite 0 2 1  1   Feeling bad or failure about yourself  0 0 0  1  Trouble concentrating 0 0 0  0  Moving slowly or fidgety/restless 0 0 0  0  Suicidal thoughts 0 0 0  0  PHQ-9 Score 2 8 3  4   Difficult doing work/chores Not difficult at all Very difficult       Quality of Life:   Personal Goals: Goals established at orientation with interventions provided to work toward goal.  Personal Goals and Risk Factors at Admission - 09/23/23 1314       Core Components/Risk Factors/Patient Goals on Admission   Improve shortness of breath with ADL's Yes    Intervention Provide education, individualized exercise plan and daily activity instruction to help decrease symptoms of SOB with activities of daily living.    Expected Outcomes Short Term: Improve cardiorespiratory fitness to achieve a reduction of symptoms when performing ADLs;Long Term: Be able to perform more ADLs without symptoms or delay the onset of symptoms           Personal Goals Discharge:  Goals and Risk Factor Review     Row Name 09/30/23 1606  10/23/23 1106 11/18/23 0929         Core Components/Risk Factors/Patient Goals Review   Personal Goals Review Develop more efficient breathing techniques such as purse lipped breathing and diaphragmatic breathing and practicing self-pacing with activity.;Improve shortness of breath with ADL's Develop more efficient breathing techniques such as purse lipped breathing and diaphragmatic breathing and practicing self-pacing with activity.;Improve shortness of breath with ADL's Improve shortness of breath with ADL's;Develop more efficient breathing techniques such as purse lipped breathing and diaphragmatic breathing and practicing self-pacing with activity.     Review Monthly review of patient's Core Components/Risk Factors/Patient Goals are as follows: Unable to assess goals. Lorean hasn't started the program yet. She is scheduled to start tomorrow, 10/01/23. Monthly review of patient's Core Components/Risk Factors/Patient Goals are as follows: Unable to assess goals. Vernia has completed 1 session. She has not been here in 3 weeks d/t illness. Hallelujah was discharged from the PR program on 11/18/23. She did not meet her goals as she only attended one session.     Expected Outcomes Pt will show progress toward meeting expected goals and outcomes. Pt will show progress toward meeting expected goals and outcomes. --        Exercise Goals and Review:  Exercise Goals     Row Name 09/23/23 1305             Exercise Goals   Increase Physical Activity Yes       Intervention Provide advice, education, support and counseling about physical activity/exercise needs.;Develop an individualized exercise prescription for aerobic and resistive training based on initial evaluation findings, risk stratification, comorbidities and participant's personal goals.       Expected Outcomes Short Term: Attend rehab on a regular basis to increase amount of physical activity.;Long Term: Add in home exercise to make exercise part of  routine and to increase amount of physical activity.;Long Term: Exercising regularly at least 3-5 days a week.       Increase Strength and Stamina Yes       Intervention Provide advice, education, support and counseling about physical activity/exercise needs.;Develop an individualized exercise prescription for aerobic and resistive training based on initial evaluation findings, risk stratification, comorbidities and participant's personal goals.       Expected Outcomes Short Term: Increase workloads  from initial exercise prescription for resistance, speed, and METs.;Short Term: Perform resistance training exercises routinely during rehab and add in resistance training at home;Long Term: Improve cardiorespiratory fitness, muscular endurance and strength as measured by increased METs and functional capacity ( )       Able to understand and use rate of perceived exertion (RPE) scale Yes       Intervention Provide education and explanation on how to use RPE scale       Expected Outcomes Short Term: Able to use RPE daily in rehab to express subjective intensity level;Long Term:  Able to use RPE to guide intensity level when exercising independently       Able to understand and use Dyspnea scale Yes       Intervention Provide education and explanation on how to use Dyspnea scale       Expected Outcomes Long Term: Able to use Dyspnea scale to guide intensity level when exercising independently;Short Term: Able to use Dyspnea scale daily in rehab to express subjective sense of shortness of breath during exertion       Knowledge and understanding of Target Heart Rate Range (THRR) Yes       Intervention Provide education and explanation of THRR including how the numbers were predicted and where they are located for reference       Expected Outcomes Short Term: Able to state/look up THRR;Long Term: Able to use THRR to govern intensity when exercising independently;Short Term: Able to use daily as guideline for  intensity in rehab       Understanding of Exercise Prescription Yes       Intervention Provide education, explanation, and written materials on patient's individual exercise prescription       Expected Outcomes Short Term: Able to explain program exercise prescription;Long Term: Able to explain home exercise prescription to exercise independently          Exercise Goals Re-Evaluation:  Exercise Goals Re-Evaluation     Row Name 09/29/23 0844 10/23/23 1018 11/17/23 1554         Exercise Goal Re-Evaluation   Exercise Goals Review Increase Physical Activity;Able to understand and use rate of perceived exertion (RPE) scale;Increase Strength and Stamina;Knowledge and understanding of Target Heart Rate Range (THRR);Understanding of Exercise Prescription;Able to understand and use Dyspnea scale Increase Physical Activity;Able to understand and use rate of perceived exertion (RPE) scale;Increase Strength and Stamina;Knowledge and understanding of Target Heart Rate Range (THRR);Understanding of Exercise Prescription;Able to understand and use Dyspnea scale Increase Physical Activity;Able to understand and use rate of perceived exertion (RPE) scale;Increase Strength and Stamina;Knowledge and understanding of Target Heart Rate Range (THRR);Understanding of Exercise Prescription;Able to understand and use Dyspnea scale     Comments Pt is scheduled to begin exercise 8/28. Will progress as tolerated. Laneya has completed 1 exercise session. She exercises for 15 min on the track and Nustep. She averages 1.77 METs on the track and 1.9 METs at level 1 on the Nustep. She performs the warmup and cooldown standing/ seated dependent on her fatigue and shortness of breath. It is too soon to notate any discernable progressions. She has missed most of her exercise sessions due to illness and other commitments. I am unsure how motivated she is to exercise in the program. Will continue to monitor and progress as able. Janautica has  completed 1 exercise session. Her peak METs were 1.77 on the track and 1.9 on the Nustep. She is being discharged. Myriah feels that she has a lot going  on right now.     Expected Outcomes Through exercise at rehab and home, the patient will decrease shortness of breath with daily activities and feel confident in carrying out an exercise regimen at home. Through exercise at rehab and home, the patient will decrease shortness of breath with daily activities and feel confident in carrying out an exercise regimen at home. Through exercise at rehab and home, the patient will decrease shortness of breath with daily activities and feel confident in carrying out an exercise regimen at home.        Nutrition & Weight - Outcomes:  Pre Biometrics - 09/23/23 1419       Pre Biometrics   Grip Strength 22 kg           Nutrition:   Nutrition Discharge:   Education Questionnaire Score:  Knowledge Questionnaire Score - 09/23/23 1409       Knowledge Questionnaire Score   Pre Score 16/18          Goals reviewed with patient; copy given to patient.

## 2023-11-18 NOTE — Addendum Note (Signed)
 Encounter addended by: Claudene Augustin NOVAK, RRT on: 11/18/2023 9:41 AM  Actions taken: Flowsheet accepted, Episode resolved, Clinical Note Signed

## 2023-11-19 ENCOUNTER — Encounter (HOSPITAL_COMMUNITY)

## 2023-11-24 ENCOUNTER — Telehealth: Payer: Self-pay

## 2023-11-24 ENCOUNTER — Encounter (HOSPITAL_COMMUNITY)

## 2023-11-24 NOTE — Telephone Encounter (Signed)
 Pt tests INR at home. Pt is due for testing.   LVM it is time to test.

## 2023-11-25 NOTE — Telephone Encounter (Signed)
LVM it is time to test INR

## 2023-11-26 ENCOUNTER — Ambulatory Visit
Admission: RE | Admit: 2023-11-26 | Discharge: 2023-11-26 | Disposition: A | Discharge status: Home / Self Care | Source: Ambulatory Visit

## 2023-11-26 ENCOUNTER — Encounter (HOSPITAL_COMMUNITY)

## 2023-11-26 DIAGNOSIS — K219 Gastro-esophageal reflux disease without esophagitis: Secondary | ICD-10-CM

## 2023-11-26 DIAGNOSIS — R112 Nausea with vomiting, unspecified: Secondary | ICD-10-CM

## 2023-11-26 NOTE — Telephone Encounter (Signed)
 LVM requesting pt test INR.

## 2023-11-27 ENCOUNTER — Ambulatory Visit (INDEPENDENT_AMBULATORY_CARE_PROVIDER_SITE_OTHER): Payer: Self-pay

## 2023-11-27 DIAGNOSIS — Z7901 Long term (current) use of anticoagulants: Secondary | ICD-10-CM | POA: Diagnosis not present

## 2023-11-27 LAB — POCT INR: INR: 4.3 — AB (ref 2.0–3.0)

## 2023-11-27 NOTE — Patient Instructions (Addendum)
 Pre visit review using our clinic review tool, if applicable. No additional management support is needed unless otherwise documented below in the visit note.  Hold warfarin today and then change weekly dose to take 1 1/2 tablets daily. Recheck on 11/5.

## 2023-11-27 NOTE — Progress Notes (Signed)
 Pt tests INR at home.  Hold warfarin today and then change weekly dose to take 1 1/2 tablets daily. Recheck on 11/5.  Advised pt of dose change and recheck date. Pt denies any s/s of bleeding or abnormal bruising. Advised if any s/s to go to ER. Pt verbalized understanding.

## 2023-12-01 ENCOUNTER — Encounter (HOSPITAL_COMMUNITY)

## 2023-12-01 NOTE — Progress Notes (Deleted)
  Cardiology Office Note   Date:  12/01/2023  ID:  [name], DOB 16-Jan-1966, MRN [id number] PCP: Cheryl Laine DELENA, MD  [facility] Providers Cardiologist:  Vina Gull, MD { Click to update primary MD,subspecialty MD or APP then REFRESH:1}    History of Present Illness [name] is a 57 y.o. female with a past medical history of non-obstructive CAD noted on CT imaging, hypertension, aortic stenosis s/p AVR 2022, OSA on CPAP, SLE with nephritis, hypothyroidism, CKD, factor V Leyden mutation, history of DVT/PE, atrial fibrillation postoperatively, dyslipidemia, recurrent pleural effusions s/p left-sided VATS.  11/19/2021 echo EF 60 to 65%, aortic valve regurgitation not visualized and 21 mm bioprosthetic valve present in aortic location with normal structure and functioning 05/21/2020 echo EF 60 to 65%, mild LVH, grade 2 DD, aortic valve functioning appropriately 04/06/2020 AVR 21 mm Edwards pericardial valve 01/20/2020 coronary CT severe aortic stenosis 01/20/2019 carotid duplex mild bilateral carotid artery stenosis  She established care with Dr. Gull in 2016 for evaluation of aortic stenosis.  Around 2021 she began having episodes of chest pain, she underwent cardiac catheterization revealing normal coronary arteries, findings consistent with severe AS.  In March 2022 she had been readmitted to the hospital underwent AVR with 21 mm Inspiris bioprosthesis valve, she had postoperative atrial fibrillation and was started on amiodarone  but this was subsequently discontinued.    Most recently evaluated by Dr. Gull on 05/06/2022, she was overall doing okay, some days where she felt short of breath but largely stable, no changes made to medications or plan of care and she was advised to follow-up in 6 months.  ROS: ROS   Studies Reviewed      *** Risk Assessment/Calculations {Does this patient have ATRIAL FIBRILLATION?:[id number]} No BP recorded.  {Refresh Note OR Click here to  enter BP  :1}***       Physical Exam VS:  LMP 11/08/2010        Wt Readings from Last 3 Encounters:  10/01/23 (!) 367 lb 11.6 oz (166.8 kg)  09/23/23 (!) 370 lb 13 oz (168.2 kg)  09/02/23 (!) 368 lb (166.9 kg)    GEN: Well nourished, well developed in no acute distress NECK: No JVD; No carotid bruits CARDIAC: ***RRR, no murmurs, rubs, gallops RESPIRATORY:  Clear to auscultation without rales, wheezing or rhonchi  ABDOMEN: Soft, non-tender, non-distended EXTREMITIES:  No edema; No deformity   ASSESSMENT AND PLAN Aortic stenosis s/p AVR -  Mild non-obstructive CAD - noted on CT imaging >> [facility] revealed normal coronary arteries Hypertension -  Factor V Leiden - Anticoagulated on warfarin managed by PCP.  PAF -postoperatively following AVR without recurrence. Anticoagulated on warfarin managed by PCP.     {Are you ordering a CV Procedure (e.g. stress test, cath, DCCV, TEE, etc)?   Press F2        :[id number]}  Dispo: ***  Signed, Delon JAYSON Hoover, NP

## 2023-12-03 ENCOUNTER — Encounter (HOSPITAL_COMMUNITY)

## 2023-12-04 ENCOUNTER — Other Ambulatory Visit: Payer: Self-pay | Admitting: Internal Medicine

## 2023-12-04 ENCOUNTER — Ambulatory Visit: Admitting: Cardiology

## 2023-12-04 DIAGNOSIS — Z952 Presence of prosthetic heart valve: Secondary | ICD-10-CM

## 2023-12-04 DIAGNOSIS — I350 Nonrheumatic aortic (valve) stenosis: Secondary | ICD-10-CM

## 2023-12-04 DIAGNOSIS — I480 Paroxysmal atrial fibrillation: Secondary | ICD-10-CM

## 2023-12-04 DIAGNOSIS — D6851 Activated protein C resistance: Secondary | ICD-10-CM

## 2023-12-04 DIAGNOSIS — I10 Essential (primary) hypertension: Secondary | ICD-10-CM

## 2023-12-08 ENCOUNTER — Encounter (HOSPITAL_COMMUNITY)

## 2023-12-08 ENCOUNTER — Ambulatory Visit (INDEPENDENT_AMBULATORY_CARE_PROVIDER_SITE_OTHER): Payer: Self-pay

## 2023-12-08 DIAGNOSIS — Z7901 Long term (current) use of anticoagulants: Secondary | ICD-10-CM | POA: Diagnosis not present

## 2023-12-08 LAB — POCT INR: INR: 2.5 (ref 2.0–3.0)

## 2023-12-08 NOTE — Patient Instructions (Addendum)
 Pre visit review using our clinic review tool, if applicable. No additional management support is needed unless otherwise documented below in the visit note.  Continue 1 1/2 tablets daily. Recheck on 11/18

## 2023-12-08 NOTE — Progress Notes (Cosign Needed Addendum)
 Pt tests INR at home.  Continue 1 1/2 tablets daily. Recheck on 11/18.  LVM with dosing instructions and recheck date.   Medical screening examination/treatment/procedure(s) were performed by non-physician practitioner and as supervising physician I was immediately available for consultation/collaboration.  I agree with above. Karlynn Noel, MD

## 2023-12-10 ENCOUNTER — Encounter (HOSPITAL_COMMUNITY)

## 2023-12-12 ENCOUNTER — Other Ambulatory Visit: Payer: Self-pay | Admitting: Family Medicine

## 2023-12-15 ENCOUNTER — Encounter (HOSPITAL_COMMUNITY)

## 2023-12-17 ENCOUNTER — Encounter (HOSPITAL_COMMUNITY)

## 2023-12-21 NOTE — Progress Notes (Signed)
 " Cardiology Office Note   Date:  12/23/2023  ID:  [name], DOB November 16, 1965, MRN [id number] PCP: Randeen Laine DELENA, MD  [facility] Providers Cardiologist:  Vina Gull, MD     History of Present Illness [name] is a 58 y.o. female with a past medical history of non-obstructive CAD noted on CT imaging, hypertension, aortic stenosis s/p AVR 2022, OSA on CPAP, SLE with nephritis, hypothyroidism, CKD, factor V Leyden mutation, history of DVT/PE, atrial fibrillation postoperatively, dyslipidemia, recurrent pleural effusions s/p left-sided VATS, morbid obesity, s/p bariatric surgery.  11/19/2021 echo EF 60 to 65%, aortic valve regurgitation not visualized and 21 mm bioprosthetic valve present in aortic location with normal structure and functioning 05/21/2020 echo EF 60 to 65%, mild LVH, grade 2 DD, aortic valve functioning appropriately 04/06/2020 AVR 21 mm Edwards pericardial valve 01/20/2020 coronary CT severe aortic stenosis 01/20/2019 carotid duplex mild bilateral carotid artery stenosis  Previously followed by pulmonology for chronic left-sided empyema, she underwent VATS in 2010.  It appears she had IVC filter placed at some point for hypercoagulable state.  She has factor V Leyden mutation with history of DVT and PE, on Coumadin  monitored and managed by her PCP  She established care with Dr. Gull in 2016 for evaluation of aortic stenosis.  Around 2021 she began having episodes of chest pain, she underwent cardiac catheterization revealing normal coronary arteries, findings consistent with severe AS.  In March 2022 she had been readmitted to the hospital underwent AVR with 21 mm Inspiris bioprosthesis valve, she had postoperative atrial fibrillation and was started on amiodarone  but this was subsequently discontinued.    Most recently evaluated by Dr. Gull on 05/06/2022, she was overall doing okay, some days where she felt short of breath but largely stable, no changes made to  medications or plan of care and she was advised to follow-up in 6 months.  She presents today for follow-up, is doing stable overall from a cardiac perspective, has been bothered by some shortness of breath that seems to be increasing for her.  She also mentions over the summer she was dealing with recurrent cough/URI symptoms, at time having fevers as well.  She also mentions she has been using her albuterol  inhaler more frequently, it appears she was previously established with pulmonology but has not seen them in greater than 10 years.  She has OSA, wears CPAP when she sleeps in bed however she has mostly been sleeping in a recliner the last few months secondary to a cough--is on Tessalon  Perles for this.  She was previously enrolled in pulmonary rehab however she continued to have recurrent sick days she is ultimately put her pulmonary rehab on hold. She denies chest pain, palpitations, pnd, orthopnea, n, v, dizziness, syncope, edema, weight gain, or early satiety.    ROS: Review of Systems  Respiratory:  Positive for shortness of breath.   All other systems reviewed and are negative.    Studies Reviewed      Cardiac Studies & Procedures   ______________________________________________________________________________________________ CARDIAC CATHETERIZATION  CARDIAC CATHETERIZATION 01/19/2020  Conclusion Images from the original result were not included.   Hemodynamic findings consistent with aortic valve stenosis.  TACIE MCCUISTION is a 58 y.o. female   [id number] LOCATION:  FACILITY: [facility] PHYSICIAN: Dorn Lesches, M.D. 1965/06/24   DATE OF PROCEDURE:  02/14/2020  DATE OF DISCHARGE:     CARDIAC CATHETERIZATION    History obtained from chart review.[name] is a 58 y.o. female  with a history of moderate aortic stenosis on Echo in 11/2018, recurrent pleural effusion with superinfection on left s/p VATS and surgical pleurodesis in 2010, Factor V Leiden with prior PE/DVT  on Coumadin , intracerebral hemorrhage, lupus with nephritis, hypertension, hyperlipidemia, GERD, hypothyroidism, and morbid obesity s/p gastric sleeve in 2012 who is being seen today for the evaluation of aortic stenosis at the request of Dr. Juvenal.  She presents today for right left heart cath to define her anatomy and physiology.   PROCEDURE DESCRIPTION:  The patient was brought to the second floor [facility] cath lab in the postabsorptive state.  She was premedicated .  Her right wrist and antecubital fossa Were prepped and shaved in usual sterile fashion. Xylocaine  1% was used for local anesthesia. A 6 French sheath was inserted into the right radial artery using standard Seldinger technique.  A 5 French sheath was inserted into the right antecubital vein.  A 5 French balloontipped Swan-Ganz catheter was advanced through the right heart chambers obtaining sequential pressures and blood samples for the determination of Fick cardiac output.  S the patient received 4000 units  of heparin  intravenously.  5 French TIG catheter right Judkins catheters were used for selective coronary angiography and obtaining left heart pressures.  Isovue dye was used for the entirety of the case.  Retrograde aorta, left ventricular and pullback pressures were recorded.  Radial cocktail was administered via the SideArm sheath.    Impression Ms. Coate has severe AS with normal coronary arteries.  She is being evaluated by Dr. Lucas for aortic valve replacement.  Dorn Lesches. MD, [facility] 02/14/2020 4:38 PM  Findings Coronary Findings Diagnostic  Dominance: Right  No diagnostic findings have been documented. Intervention  No interventions have been documented.     ECHOCARDIOGRAM  ECHOCARDIOGRAM COMPLETE 11/19/2021  Narrative ECHOCARDIOGRAM REPORT    Patient Name:   [name] Date of Exam: 11/19/2021 Medical Rec #:  [id number]      Height:       63.5 in Accession #:    [id number]      Weight:       331.0 lb Date of Birth:  1965-02-14       BSA:          2.409 m Patient Age:    58 years       BP:           124/76 mmHg Patient Gender: F              HR:           74 bpm. Exam Location:  [address]  Procedure: 2D Echo, Cardiac Doppler and Color Doppler  Indications:    R06.00 Dyspnea  History:        Patient has prior history of Echocardiogram examinations, most recent 05/21/2020. Aortic valve replacement-21 mm Inspiris Bioprosthesis (March 2022).; Risk Factors:Hypertension and Sleep Apnea. Lupus. Aortic Valve: 21 mm bioprosthetic valve is present in the aortic position. Procedure Date: 04/09/20.  Sonographer:    Carl Coma RDCS Referring Phys: [id number] PAULA V ROSS  IMPRESSIONS   1. Left ventricular ejection fraction, by estimation, is 60 to 65%. The left ventricle has normal function. The left ventricle has no regional wall motion abnormalities. Left ventricular diastolic parameters were normal. 2. Right ventricular systolic function is normal. The right ventricular size is normal. There is normal pulmonary artery systolic pressure. 3. The mitral valve is normal in structure. No evidence of mitral valve regurgitation. No evidence  of mitral stenosis. Moderate mitral annular calcification. 4. The aortic valve has been repaired/replaced. Aortic valve regurgitation is not visualized. There is a 21 mm bioprosthetic valve present in the aortic position. Procedure Date: 04/09/20. Echo findings are consistent with normal structure and function of the aortic valve prosthesis. Aortic valve mean gradient measures 18.4 mmHg. 5. The inferior vena cava is normal in size with greater than 50% respiratory variability, suggesting right atrial pressure of 3 mmHg.  Comparison(s): No significant change from prior study.  Conclusion(s)/Recommendation(s): Stable bioprosthetic AVR, current gradient similar to prior.  FINDINGS Left Ventricle: Left ventricular ejection fraction, by  estimation, is 60 to 65%. The left ventricle has normal function. The left ventricle has no regional wall motion abnormalities. The left ventricular internal cavity size was normal in size. There is no left ventricular hypertrophy. Left ventricular diastolic parameters were normal.  Right Ventricle: The right ventricular size is normal. No increase in right ventricular wall thickness. Right ventricular systolic function is normal. There is normal pulmonary artery systolic pressure. The tricuspid regurgitant velocity is 2.41 m/s, and with an assumed right atrial pressure of 3 mmHg, the estimated right ventricular systolic pressure is 26.2 mmHg.  Left Atrium: Left atrial size was normal in size.  Right Atrium: Right atrial size was normal in size.  Pericardium: There is no evidence of pericardial effusion.  Mitral Valve: The mitral valve is normal in structure. Moderate mitral annular calcification. No evidence of mitral valve regurgitation. No evidence of mitral valve stenosis.  Tricuspid Valve: The tricuspid valve is normal in structure. Tricuspid valve regurgitation is trivial. No evidence of tricuspid stenosis.  Aortic Valve: The aortic valve has been repaired/replaced. Aortic valve regurgitation is not visualized. Aortic valve mean gradient measures 18.4 mmHg. Aortic valve peak gradient measures 31.0 mmHg. There is a 21 mm bioprosthetic valve present in the aortic position. Procedure Date: 04/09/20. Echo findings are consistent with normal structure and function of the aortic valve prosthesis.  Pulmonic Valve: The pulmonic valve was not well visualized. Pulmonic valve regurgitation is not visualized.  Aorta: The aortic root, ascending aorta and aortic arch are all structurally normal, with no evidence of dilitation or obstruction.  Venous: The inferior vena cava is normal in size with greater than 50% respiratory variability, suggesting right atrial pressure of 3 mmHg.  IAS/Shunts: The  atrial septum is grossly normal.   LEFT VENTRICLE PLAX 2D LVIDd:         4.40 cm Diastology LVIDs:         2.90 cm LV e' medial:    10.95 cm/s LV PW:         0.80 cm LV E/e' medial:  12.3 LV IVS:        0.80 cm LV e' lateral:   9.70 cm/s LV E/e' lateral: 13.9   RIGHT VENTRICLE RV Basal diam:  3.70 cm RV S prime:     12.85 cm/s TAPSE (M-mode): 2.0 cm  LEFT ATRIUM             Index        RIGHT ATRIUM           Index LA diam:        5.00 cm 2.08 cm/m   RA Area:     15.10 cm LA Vol (A2C):   61.0 ml 25.32 ml/m  RA Volume:   42.10 ml  17.47 ml/m LA Vol (A4C):   43.6 ml 18.10 ml/m LA Biplane Vol: 53.6 ml 22.25 ml/m AORTIC VALVE  AV Vmax:           278.60 cm/s AV Vmean:          200.800 cm/s AV VTI:            0.607 m AV Peak Grad:      31.0 mmHg AV Mean Grad:      18.4 mmHg LVOT Vmax:         115.00 cm/s LVOT Vmean:        77.700 cm/s LVOT VTI:          0.256 m LVOT/AV VTI ratio: 0.42  AORTA Ao Root diam: 3.30 cm Ao Asc diam:  3.30 cm  MITRAL VALVE                TRICUSPID VALVE MV Area (PHT): 3.85 cm     TR Peak grad:   23.2 mmHg MV Decel Time: 197 msec     TR Vmax:        241.00 cm/s MV E velocity: 134.50 cm/s MV A velocity: 120.50 cm/s  SHUNTS MV E/A ratio:  1.12         Systemic VTI: 0.26 m  Shelda Bruckner MD Electronically signed by Shelda Bruckner MD Signature Date/Time: 11/19/2021/1:28:25 PM    Final   TEE  ECHO INTRAOPERATIVE TEE 04/06/2020  Narrative *INTRAOPERATIVE TRANSESOPHAGEAL REPORT *    Patient Name:   [name] Date of Exam: 04/06/2020 Medical Rec #:  [id number]      Height:       65.0 in Accession #:    [id number]     Weight:       318.0 lb Date of Birth:  [birth year]       BSA:          2.41 m Patient Age:    54 years       BP:           120/64 mmHg Patient Gender: F              HR:           86 bpm. Exam Location:  Inpatient  Transesophogeal exam was perform intraoperatively during surgical procedure. Patient was  closely monitored under general anesthesia during the entirety of examination.  Indications:     Aortic valve replacement Performing Phys: [id number] DORISE MARLA FELLERS Diagnosing Phys: Bruckner Custard MD  Complications: No known complications during this procedure. POST-OP IMPRESSIONS - Left Ventricle: has hyperdynamic systolic function, with an ejection fraction of 70%. The cavity size was normal. The wall motion is normal. - Right Ventricle: The right ventricle appears unchanged from pre-bypass. - Aortic Valve: A bioprosthetic bioprosthetic valve was placed, leaflets are freely mobile and leaflets thin Size; 21mm. No regurgitation post repair. No perivalvular leak noted. - Mitral Valve: There is moderate regurgitation. - Tricuspid Valve: The tricuspid valve appears unchanged from pre-bypass. There is moderate regurgitation. - Interatrial Septum: The interatrial septum appears unchanged from pre-bypass.  PRE-OP FINDINGS Left Ventricle: The left ventricle has hyperdynamic systolic function, with an ejection fraction of >65%. The cavity size was mildly dilated. There is moderately increased left ventricular wall thickness. No evidence of left ventricular regional wall motion abnormalities. There is moderate concentric left ventricular hypertrophy.   Right Ventricle: The right ventricle has normal systolic function. The cavity was dialated. There is no increase in right ventricular wall thickness. There is no aneurysm seen.  Left Atrium: Left atrial size was normal in size. No left atrial/left atrial appendage  thrombus was detected. The left atrial appendage is well visualized and there is no evidence of thrombus present.  Right Atrium: Right atrial size was normal in size.  Interatrial Septum: No atrial level shunt detected by color flow Doppler. The interatrial septum appears to be lipomatous. There is no evidence of a patent foramen ovale.  Pericardium: A small pericardial effusion is  present. The pericardial effusion is posterior to the left ventricle.  Mitral Valve: The mitral valve is normal in structure. Mitral valve regurgitation is trivial by color flow Doppler. The MR jet is centrally-directed. There is No evidence of mitral stenosis. There is mild thickening and mild calcification present on the mitral valve anterior cusp with normal mobility and there is mild thickening and mild calcification present on the mitral valve posterior cusp with normal mobility.  Tricuspid Valve: The tricuspid valve was dilated in appearance. Tricuspid valve regurgitation is moderate by color flow Doppler. The jet is directed centrally. No evidence of tricuspid stenosis is present.  Aortic Valve: The aortic valve is tricuspid Aortic valve regurgitation is trivial by color flow Doppler. The jet is centrally-directed. There is severe stenosis of the aortic valve. There is moderate aortic annular calcification noted. There is moderate thickening and moderate calcification present on the aortic valve right coronary cusp with severely decreased mobility and there is moderate thickening and moderate calcification present on the aortic valve left coronary cusp with moderately decreased mobility.  Pulmonic Valve: The pulmonic valve was normal in structure. Pulmonic valve regurgitation is trivial by color flow Doppler.   Aorta: The aortic root and ascending aorta are normal in size and structure. There is evidence of plaque in the descending aorta; Grade I, measuring 1-99mm in size. There is evidence of a dissection in the no aortic dissection.  +-------------+---------++ AORTIC VALVE           +-------------+---------++ AV Mean Grad:48.0 mmHg +-------------+---------++   Lonni Custard MD Electronically signed by Lonni Custard MD Signature Date/Time: 04/09/2020/9:38:03 AM    Final    CT SCANS  CT CORONARY MORPH W/CTA COR W/SCORE 01/20/2020  Addendum 01/20/2020  2:34  PM ADDENDUM REPORT: 01/20/2020 14:32  CLINICAL DATA:  Severe Aortic Stenosis.  EXAM: Cardiac TAVR CT  TECHNIQUE: The patient was scanned on a [employer]. A 120 kV retrospective scan was triggered in the descending thoracic aorta at 111 HU's. Gantry rotation speed was 250 msecs and collimation was .6 mm. No beta blockade or nitro were given. The 3D data set was reconstructed in 5% intervals of the R-R cycle. Systolic and diastolic phases were analyzed on a dedicated work station using MPR, MIP and VRT modes. The patient received 80 cc of contrast.  FINDINGS: Image quality: Average.  Noise artifact is: Significant signal to noise artifact (BMI 52).  Valve Morphology: The aortic valve is tricuspid. There is severe bulky calcification of the RCC and this leaflet is immobile. There is moderate calcification of the NCC and LCC.  Aortic Valve Calcium  score: 1789  Aortic annular dimension:  Phase assessed: 35%  Annular area: 486 mm2  Annular perimeter: 80.7 mm  Max diameter: 28.8 mm  Min diameter: 22.0 mm  Annular and subannular calcification: None.  Optimal coplanar projection: LAO 17 CAU 1  Coronary Artery Height above Annulus:  Left Main: 16.3 mm  Right Coronary: 20.2 mm  Sinus of Valsalva Measurements:  Non-coronary: 30 mm  Right-coronary: 28 mm  Left-coronary: 30 mm  Sinus of Valsalva Height:  Non-coronary: 21.1 mm  Right-coronary:  25.3 mm  Left-coronary: 22.1 mm  Sinotubular Junction: 29 mm  Ascending Thoracic Aorta: 32 mm  Coronary Arteries: Normal coronary origin. Right dominance. The study was performed without use of NTG and is insufficient for plaque evaluation. Please refer to recent cardiac catheterization for coronary assessment.  Cardiac Morphology:  Right Atrium: Right atrial size is within normal limits.  Right Ventricle: The right ventricular cavity is within normal limits.  Left Atrium: Left atrial size is normal  in size with no left atrial appendage filling defect.  Left Ventricle: The ventricular cavity size is within normal limits. There are no stigmata of prior infarction. There is no abnormal filling defect. Normal left ventricular function, 73%. No regional wall motion abnormalities.  Pulmonary arteries: Normal in size without proximal filling defect.  Pulmonary veins: Normal pulmonary venous drainage.  Pericardium: Normal thickness with no significant effusion or calcium  present.  Mitral Valve: The mitral valve is normal structure with mild mitral annular calcification.  Extra-cardiac findings: See attached radiology report for non-cardiac structures.  IMPRESSION: 1. Difficult study due to obesity artifact (BMI 52).  2. Tricuspid aortic valve with severe aortic stenosis (aortic valve calcium  score 1789).  3. Annular measurements appropriate for 26 mm Edwards S3 TAVR (486 mm2).  4. No significant annular or subannular calcifications.  5. Sufficient coronary to annulus distance.  6. Optimal Fluoroscopic Angle for Delivery:  LAO 17 CAU 1  Noma T. Barbaraann, MD   Electronically Signed By: Darryle Barbaraann On: 01/20/2020 14:32  Narrative EXAM: OVER-READ INTERPRETATION  CT CHEST  The following report is an over-read performed by radiologist Dr. Toribio Aye of [facility] on 01/20/2020. This over-read does not include interpretation of cardiac or coronary anatomy or pathology. The coronary calcium  score/coronary CTA interpretation by the cardiologist is attached.  COMPARISON:  None.  FINDINGS: Extracardiac findings will be described separately under dictation for contemporaneously obtained CTA chest, abdomen and pelvis.  IMPRESSION: Please see separate dictation for contemporaneously obtained CTA chest, abdomen and pelvis dated 01/20/2020 for full description of relevant extracardiac findings.  Electronically Signed: By: Toribio Aye M.D. On:  01/20/2020 12:59     ______________________________________________________________________________________________      Risk Assessment/Calculations  CHA2DS2-VASc Score = 2   This indicates a 2.2% annual risk of stroke. [name] score is based upon: CHF History: 0 HTN History: 1 Diabetes History: 0 Stroke History: 0 Vascular Disease History: 0 Age Score: 0 Gender Score: 1             Physical Exam VS:  BP 138/82   Pulse 98   Ht 5' 5 (1.651 m)   Wt (!) 365 lb (165.6 kg)   LMP 11/08/2010   SpO2 95%   BMI 60.74 kg/m        Wt Readings from Last 3 Encounters:  12/23/23 (!) 365 lb (165.6 kg)  10/01/23 (!) 367 lb 11.6 oz (166.8 kg)  09/23/23 (!) 370 lb 13 oz (168.2 kg)    GEN: Well nourished, well developed in no acute distress NECK: No JVD; No carotid bruits CARDIAC: RRR, 2/6 systolic murmur best heard left sternal border, rubs, gallops RESPIRATORY: Diminished, slight wheezing ABDOMEN: Soft, non-tender, non-distended EXTREMITIES: Appear to be at her baseline  ASSESSMENT AND PLAN Aortic stenosis s/p AVR -will repeat echocardiogram for surveillance.  SBE is in place.  She does have some shortness of breath however feel this is likely multifactorial.  Mild non-obstructive CAD - noted on CT imaging >> [facility] revealed normal coronary arteries.  Stable with no anginal symptoms. No indication for ischemic evaluation.  She is on Coumadin  and therefore she is not on aspirin .  Hypertension -blood pressure slightly elevated but controlled 138/82, she mentions she has been out of her metoprolol  and did not take it today, we will refill her metoprolol  to tartrate 50 mg twice daily for her.  Factor V Leiden - Anticoagulated on warfarin managed by PCP.   PAF -postoperatively following AVR without recurrence. Anticoagulated on warfarin managed by PCP.   Shortness of breath-likely multifactorial, repeating echocardiogram for surveillance of her AVR, will refer her back to  pulmonology as she has not seen them in some time.  Morbid obesity - s/p gastric bypass, looks like PCP was trying to get her approved for GLP 1.       Dispo: Ambulatory referral to pulmonology, echocardiogram, follow-up in 6 months.  Signed, Delon JAYSON Hoover, NP  "

## 2023-12-22 ENCOUNTER — Ambulatory Visit (INDEPENDENT_AMBULATORY_CARE_PROVIDER_SITE_OTHER): Payer: Self-pay

## 2023-12-22 ENCOUNTER — Telehealth: Payer: Self-pay

## 2023-12-22 ENCOUNTER — Encounter (HOSPITAL_COMMUNITY)

## 2023-12-22 DIAGNOSIS — Z7901 Long term (current) use of anticoagulants: Secondary | ICD-10-CM | POA: Diagnosis not present

## 2023-12-22 LAB — POCT INR: INR: 2 (ref 2.0–3.0)

## 2023-12-22 NOTE — Patient Instructions (Addendum)
 Pre visit review using our clinic review tool, if applicable. No additional management support is needed unless otherwise documented below in the visit note.  Increase dose today to take 2 tablets and increase dose tomorrow to take 2 tablets and then continue 1 1/2 tablets daily. Recheck on 12/2.

## 2023-12-22 NOTE — Progress Notes (Signed)
 Pt tests INR at home. INR today is 2.0 Pt denies any changes. Increase dose today to take 2 tablets and increase dose tomorrow to take 2 tablets and then continue 1 1/2 tablets daily. Recheck on 12/2.  Advised pt of dosing and recheck date. Pt verbalized understanding.

## 2023-12-22 NOTE — Telephone Encounter (Signed)
 Pt tests INR at home. LVM it is time to test.

## 2023-12-23 ENCOUNTER — Ambulatory Visit: Attending: Cardiology | Admitting: Cardiology

## 2023-12-23 ENCOUNTER — Encounter: Payer: Self-pay | Admitting: Cardiology

## 2023-12-23 VITALS — BP 138/82 | HR 98 | Ht 65.0 in | Wt 365.0 lb

## 2023-12-23 DIAGNOSIS — G4733 Obstructive sleep apnea (adult) (pediatric): Secondary | ICD-10-CM

## 2023-12-23 DIAGNOSIS — I480 Paroxysmal atrial fibrillation: Secondary | ICD-10-CM

## 2023-12-23 DIAGNOSIS — I350 Nonrheumatic aortic (valve) stenosis: Secondary | ICD-10-CM

## 2023-12-23 DIAGNOSIS — D6851 Activated protein C resistance: Secondary | ICD-10-CM

## 2023-12-23 DIAGNOSIS — R0602 Shortness of breath: Secondary | ICD-10-CM | POA: Diagnosis not present

## 2023-12-23 NOTE — Patient Instructions (Signed)
 Medication Instructions:  Your physician recommends that you continue on your current medications as directed. Please refer to the Current Medication list given to you today.  *If you need a refill on your cardiac medications before your next appointment, please call your pharmacy*   Testing/Procedures: Your physician has requested that you have an echocardiogram. Echocardiography is a painless test that uses sound waves to create images of your heart. It provides your doctor with information about the size and shape of your heart and how well your heart's chambers and valves are working. This procedure takes approximately one hour. There are no restrictions for this procedure. Please do NOT wear cologne, perfume, aftershave, or lotions (deodorant is allowed). Please arrive 15 minutes prior to your appointment time.  Please note: We ask at that you not bring children with you during ultrasound (echo/ vascular) testing. Due to room size and safety concerns, children are not allowed in the ultrasound rooms during exams. Our front office staff cannot provide observation of children in our lobby area while testing is being conducted. An adult accompanying a patient to their appointment will only be allowed in the ultrasound room at the discretion of the ultrasound technician under special circumstances. We apologize for any inconvenience.   Follow-Up: At [facility], you and your health needs are our priority.  As part of our continuing mission to provide you with exceptional heart care, our providers are all part of one team.  This team includes your primary Cardiologist (physician) and Advanced Practice Providers or APPs (Physician Assistants and Nurse Practitioners) who all work together to provide you with the care you need, when you need it.  Your next appointment:   6 month(s)  Provider:   Vina Gull, MD    We recommend signing up for the patient portal called MyChart.  Sign up  information is provided on this After Visit Summary.  MyChart is used to connect with patients for Virtual Visits (Telemedicine).  Patients are able to view lab/test results, encounter notes, upcoming appointments, etc.  Non-urgent messages can be sent to your provider as well.   To learn more about what you can do with MyChart, go to [email or web address].   Other Instructions Referral sent to Pulmonary

## 2023-12-24 ENCOUNTER — Encounter (HOSPITAL_COMMUNITY)

## 2023-12-28 ENCOUNTER — Other Ambulatory Visit: Payer: Self-pay | Admitting: Internal Medicine

## 2023-12-29 ENCOUNTER — Encounter (HOSPITAL_COMMUNITY)

## 2024-01-05 ENCOUNTER — Encounter (HOSPITAL_COMMUNITY)

## 2024-01-06 ENCOUNTER — Telehealth: Payer: Self-pay

## 2024-01-06 NOTE — Telephone Encounter (Signed)
 Pt tests INR at home. LVM it is time to test.

## 2024-01-07 ENCOUNTER — Ambulatory Visit: Payer: Self-pay

## 2024-01-07 ENCOUNTER — Encounter (HOSPITAL_COMMUNITY)

## 2024-01-07 ENCOUNTER — Other Ambulatory Visit: Payer: Self-pay | Admitting: Family Medicine

## 2024-01-07 DIAGNOSIS — Z7901 Long term (current) use of anticoagulants: Secondary | ICD-10-CM

## 2024-01-07 LAB — POCT INR: INR: 2.4 (ref 2.0–3.0)

## 2024-01-07 NOTE — Telephone Encounter (Signed)
 Pt is due for her CPE labs prior (need to recheck thyroid ) on or after 01/16/24, please schedule then route back to me to refill. Thanks

## 2024-01-07 NOTE — Patient Instructions (Addendum)
 Pre visit review using our clinic review tool, if applicable. No additional management support is needed unless otherwise documented below in the visit note.  Increase dose today to take 2 tablets and then continue 1 1/2 tablets daily. Recheck on 12/16.

## 2024-01-07 NOTE — Telephone Encounter (Signed)
 LVM it is time to test.

## 2024-01-07 NOTE — Progress Notes (Signed)
 Pt tests INR at home. INR today is 2.4 Pt missed dose 5 days ago. Due to this will not make a change to weekly dosing.  Increase dose today to take 2 tablets and then continue 1 1/2 tablets daily. Recheck on 12/16.  Advised pt of dosing and recheck date. Pt verbalized understanding.

## 2024-01-08 NOTE — Telephone Encounter (Signed)
I got her scheduled

## 2024-01-11 LAB — LAB REPORT - SCANNED: EGFR: 64

## 2024-01-12 ENCOUNTER — Encounter (HOSPITAL_COMMUNITY)

## 2024-01-14 ENCOUNTER — Other Ambulatory Visit: Payer: Self-pay | Admitting: Family Medicine

## 2024-01-14 ENCOUNTER — Encounter (HOSPITAL_COMMUNITY)

## 2024-01-14 ENCOUNTER — Ambulatory Visit (INDEPENDENT_AMBULATORY_CARE_PROVIDER_SITE_OTHER): Admitting: Pulmonary Disease

## 2024-01-14 ENCOUNTER — Encounter: Payer: Self-pay | Admitting: Pulmonary Disease

## 2024-01-14 VITALS — BP 110/70 | HR 65 | Temp 98.3°F | Ht 65.0 in | Wt 368.1 lb

## 2024-01-14 DIAGNOSIS — E669 Obesity, unspecified: Secondary | ICD-10-CM

## 2024-01-14 DIAGNOSIS — J40 Bronchitis, not specified as acute or chronic: Secondary | ICD-10-CM | POA: Diagnosis not present

## 2024-01-14 DIAGNOSIS — Z6841 Body Mass Index (BMI) 40.0 and over, adult: Secondary | ICD-10-CM | POA: Diagnosis not present

## 2024-01-14 DIAGNOSIS — R0609 Other forms of dyspnea: Secondary | ICD-10-CM | POA: Diagnosis not present

## 2024-01-14 DIAGNOSIS — G4733 Obstructive sleep apnea (adult) (pediatric): Secondary | ICD-10-CM

## 2024-01-14 DIAGNOSIS — F17210 Nicotine dependence, cigarettes, uncomplicated: Secondary | ICD-10-CM | POA: Diagnosis not present

## 2024-01-14 NOTE — Progress Notes (Unsigned)
 @Patient  ID: [name], female    DOB: 08-31-1965, 58 y.o.   MRN: [id number]  Chief Complaint  Patient presents with   Consult    Consult for sob    Referring provider: Carlin Delon BROCKS, NP  HPI:   58 y.o. woman prior history of empyema with chronic pleural scarring on serial imaging, history of aortic valve repair/replacement here for evaluation of dyspnea on exertion.  Multiple notes from PCP office reviewed.  Multiple cardiology notes reviewed.  Remote pulmonary note 2014 reviewed.  Short of breath now for many years.  Few bouts of bronchitis already spring 2025 through summer 2025.  Cough congestion worsening shortness of breath.  Occasional wheeze.  Improved with antibiotics steroids etc.  Albuterol  inhaler helped with those acute symptoms.  Will get better then recur after a few weeks.  Fortunately, last bout in the summer and has been well for a few months.  Dyspnea is been present for a long time.  Worse on inclines or stairs.  My breast she is okay.  Moving around minimal exertion walking on flat surfaces etc. causes dyspnea.  Most recent TTE 2023 showed normal PERI valve prosthesis with no significant valvular function otherwise, normal RV and LV function, no diastolic dysfunction.  Repeat TTE 2025 as ordered but pending and not performed yet.  Never had pulmonary function test in the past.  Most recent chest x-ray summer 2025 reviewed, chronic pleural changes, clear lungs bilaterally on my review and interpretation.  Denies history of asthma.  Minimal smoking history.    Questionaires / Pulmonary Flowsheets:   ACT:      No data to display          MMRC:     No data to display          Epworth:      No data to display          Tests:   FENO:  No results found for: NITRICOXIDE  PFT:     No data to display          WALK:     09/23/2023    2:11 PM  SIX MIN WALK  2 Minute Oxygen Saturation % 93 %  2 Minute HR 80  4 Minute Oxygen  Saturation % 85 %  4 Minute HR 84  6 Minute Oxygen Saturation % 91 %  6 Minute HR 87    Imaging: Personally reviewed and as per EMR and discussion in this note No results found.  Lab Results: Personally reviewed CBC    Component Value Date/Time   WBC 5.6 07/15/2023 1134   RBC 4.83 07/15/2023 1134   HGB 12.4 07/15/2023 1134   HGB 12.1 09/27/2020 1401   HGB 10.2 (L) 12/06/2012 1443   HCT 41.5 07/15/2023 1134   HCT 31.5 (L) 12/06/2012 1443   PLT 204 07/15/2023 1134   PLT 173 09/27/2020 1401   PLT 191 12/06/2012 1443   MCV 85.9 07/15/2023 1134   MCV 88.0 12/06/2012 1443   MCH 25.7 (L) 07/15/2023 1134   MCHC 29.9 (L) 07/15/2023 1134   RDW 14.9 07/15/2023 1134   RDW 13.9 12/06/2012 1443   LYMPHSABS 1.2 01/15/2023 1036   LYMPHSABS 0.4 (L) 12/06/2012 1443   MONOABS 0.3 01/15/2023 1036   MONOABS 0.3 12/06/2012 1443   EOSABS 280 07/15/2023 1134   EOSABS 0.0 12/06/2012 1443   BASOSABS 62 07/15/2023 1134   BASOSABS 0.0 12/06/2012 1443    BMET  Component Value Date/Time   NA 139 07/01/2023 1724   NA 140 12/06/2012 1444   K 4.5 07/01/2023 1724   K 5.1 12/06/2012 1444   CL 103 07/01/2023 1724   CO2 29 07/01/2023 1724   CO2 23 12/06/2012 1444   GLUCOSE 136 (H) 07/01/2023 1724   GLUCOSE 89 12/06/2012 1444   BUN 7 07/01/2023 1724   BUN 21.4 12/06/2012 1444   CREATININE 0.98 07/01/2023 1724   CREATININE 1.1 12/06/2012 1444   CALCIUM  9.2 07/01/2023 1724   CALCIUM  8.7 12/06/2012 1444   GFRNONAA >60 07/01/2023 1724   GFRAA >60 08/25/2016 1550    BNP    Component Value Date/Time   BNP 43.6 08/26/2021 1213    ProBNP    Component Value Date/Time   PROBNP 49.6 11/19/2009 1200    Specialty Problems       Pulmonary Problems   Pleural effusion       IMO SNOMED Dx Update Oct 2024  Last Assessment & Plan:   Reviewed hospital records, lab results and studies in detail  Stable  Not source of cp For f/u with cardiothoracic surg as planned  Last Assessment &  Plan:   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  Current settings 14cmh20, pt best controlled on autoset at 14cmh20  Obstructive sleep apnea of moderate to severe diagnosis 2008 Full face mask with 14 cm water pressure 01/23/2012 Download :  Good compliance with Cpap.  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)   Globus sensation   Cough   Hemoptysis   Hypoxia   Pneumonia  SLE affecting lung        Allergies[1]  Immunization History  Administered Date(s) Administered   Influenza Whole 12/25/2008, 11/12/2010, 11/08/2011   Influenza, Seasonal, Injecte, Preservative Fre 01/15/2023   Influenza,inj,Quad PF,6+ Mos 12/07/2012, 03/04/2016, 11/16/2017, 01/04/2019, 10/23/2020   Influenza-Unspecified 11/03/2013   PFIZER(Purple Top)SARS-COV-2 Vaccination 04/16/2019, 05/07/2019, 01/06/2020   Pfizer Covid-19 Vaccine Bivalent Booster 31yrs & up 01/23/2021   Tdap 02/28/2014    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   HLD (hyperlipidemia)    Iron  deficiency anemia    IV dextran [place]   Nephritis and nephropathy, not specified as acute or chronic, with unspecified pathological lesion in kidney    Nonspecific abnormal results of liver function study    Obesity, unspecified    Other  specified acquired hypothyroidism    Panic disorder without agoraphobia    Personal history of venous thrombosis and embolism 2002   during pregnancy; ?factor 5 leiden (sees heme)   Pleural effusion 2005   c/w lupus initial w/u; recurrent Right as pred tapered off July 2011   Sleep apnea    Systemic lupus erythematosus (HCC)    renal GN, hx of pericardial eff in late 90's   Unspecified essential hypertension     Tobacco History: Tobacco Use History[2] Counseling given: Not Answered Tobacco comments: Socially x10 years (1 pack/month)   Continue to not smoke  Outpatient Encounter Medications as of 01/14/2024  Medication Sig   albuterol  (VENTOLIN  HFA) 108 (90 Base) MCG/ACT inhaler Inhale 1-2 puffs into the lungs every 6 (six) hours as needed for wheezing or shortness of breath.   aspirin  EC 81 MG EC tablet Take 1 tablet (81 mg total) by mouth daily. Swallow whole.   benzonatate  (TESSALON ) 200 MG capsule Take 1 capsule (200 mg total) by mouth 3 (three) times daily as needed. Swallow whole   bismuth  subsalicylate (PEPTO BISMOL) 262 MG/15ML suspension Take 30 mLs by mouth every 6 (six) hours as needed for indigestion or diarrhea or loose stools.   budesonide-formoterol (SYMBICORT) 160-4.5 MCG/ACT inhaler Inhale 2 puffs into the lungs 2 (two) times daily.   calcium  carbonate (TUMS - DOSED IN MG ELEMENTAL CALCIUM ) 500 MG chewable tablet Chew 1-2 tablets by mouth 3 (three) times daily as needed for indigestion or heartburn.   chlorhexidine  (PERIDEX ) 0.12 % solution AFTER BRUSHING, RINSE WITH 1 CAPFUL FOR 1 MINUTE TWICE A DAY THEN SPIT OUT   cyanocobalamin  (VITAMIN B12) 1000 MCG/ML injection  Inject 1 mL (1,000 mcg total) into the muscle every 6 (six) weeks.   cyclobenzaprine  (FLEXERIL ) 10 MG tablet Take 1 tablet (10 mg total) by mouth 3 (three) times daily as needed.   ezetimibe  (ZETIA ) 10 MG tablet TAKE 1 TABLET BY MOUTH EVERY DAY   furosemide  (LASIX ) 20 MG tablet Take 20 mg by mouth daily.   hydroxychloroquine  (PLAQUENIL ) 200 MG tablet Take 200 mg by mouth 2 (two) times daily.   levothyroxine  (SYNTHROID ) 175 MCG tablet TAKE 1 TABLET BY MOUTH EVERY DAY BEFORE BREAKFAST   metoprolol  tartrate (LOPRESSOR ) 50 MG tablet TAKE 1 TABLET BY MOUTH TWICE A DAY   Multiple Vitamins-Minerals (BARIATRIC MULTIVITAMINS/IRON ) CAPS Take 1 tablet by mouth daily.   omeprazole  (PRILOSEC) 40 MG capsule Take 1 capsule (40 mg total) by mouth daily.   PARoxetine  (PAXIL ) 40 MG tablet TAKE 1 TABLET BY MOUTH EVERY DAY IN THE MORNING   rosuvastatin  (CRESTOR ) 40 MG tablet TAKE 1 TABLET BY MOUTH EVERY DAY   warfarin (COUMADIN ) 7.5 MG tablet TAKE 1 AND 1/2 TABLETS BY MOUTH DAILY EXCEPT 2 TABLETS ON MONDAY, WEDNESDAY AND FRIDAY OR AS DIRECTED BY ANTICOAGULATION CLINIC   [DISCONTINUED] amoxicillin  (AMOXIL ) 500 MG tablet TAKE 4 TABS (2000 MG) 30-60 MIN BEFORE DENTAL PROCEDURE.   tirzepatide  (ZEPBOUND ) 2.5 MG/0.5ML Pen Inject 2.5 mg into the skin once a week. (Patient not taking: Reported on 01/14/2024)   No facility-administered encounter medications on file as of 01/14/2024.     Review of Systems  Review of Systems  No chest pain with exertion.  Comprehensive review of systems otherwise negative. Physical Exam  BP 110/70   Pulse 65   Temp 98.3 F (36.8 C) (Oral)   Ht 5' 5 (1.651 m)   Wt (!) 368 lb 1.6 oz (167 kg)   LMP 11/08/2010  SpO2 96%   BMI 61.26 kg/m   Wt Readings from Last 5 Encounters:  01/14/24 (!) 368 lb 1.6 oz (167 kg)  12/23/23 (!) 365 lb (165.6 kg)  10/01/23 (!) 367 lb 11.6 oz (166.8 kg)  09/23/23 (!) 370 lb 13 oz (168.2 kg)  09/02/23 (!) 368 lb (166.9 kg)    BMI  Readings from Last 5 Encounters:  01/14/24 61.26 kg/m  12/23/23 60.74 kg/m  10/01/23 61.19 kg/m  09/23/23 61.71 kg/m  09/02/23 61.24 kg/m     Physical Exam General: Attentive, no distress Eyes: EOMI Neck: Supple, no JVP Pulmonary: Good air movement, no work of breathing, no wheeze Cardiovascular: Warm Chest: Midline sternotomy scar noted   Assessment & Plan:   Dyspnea on exertion: Suspect multifactorial.  Mild improvement with the use of albuterol  at times.  PFTs for further evaluation.  Symbicort 2 puff twice daily prescribed today.  Recurrent bronchitis: Concern for development of new asthma.  No prior symptoms ETC. no further episodes over the last few months.  Improvement with albuterol  in terms of dyspnea further increasing suspicion of possible asthma.  Obesity, moderate OSA: Candidate for Zepbound .  Will message PCP.   Return in about 3 months (around 04/13/2024) for f/u Dr. Annella.   Donnice JONELLE Annella, MD 01/14/2024   This appointment required *** minutes of patient care (this includes precharting, chart review, review of results, face-to-face care, etc.).      [1] Allergies Allergen Reactions   Lisinopril  Cough   Lovenox  [Enoxaparin  Sodium] Itching   Moxifloxacin Other (See Comments)    REACTION: hallucinations   Lovenox  [Enoxaparin ]     Itching   [2] Social History Tobacco Use  Smoking Status Former   Current packs/day: 0.00   Average packs/day: 0.5 packs/day for 10.0 years (5.0 ttl pk-yrs)   Types: Cigarettes   Start date: 05/05/1999   Quit date: 05/04/2009   Years since quitting: 14.7  Smokeless Tobacco Never  Tobacco Comments   Socially x10 years (1 pack/month)

## 2024-01-14 NOTE — Patient Instructions (Signed)
 Nice to meet you  I ordered pulmonary function test to further evaluate her understand her shortness of breath  In the meantime, use Symbicort 2 puffs twice a day every day.  Morning and evening.  Rinse mouth out with water thoroughly after every use.  If this is too expensive let me know I will look for more cost-effective alternative.  Return to clinic in 3 months or sooner as needed with Dr. Annella after pulmonary function test

## 2024-01-18 ENCOUNTER — Telehealth: Payer: Self-pay | Admitting: Family Medicine

## 2024-01-18 DIAGNOSIS — D696 Thrombocytopenia, unspecified: Secondary | ICD-10-CM

## 2024-01-18 DIAGNOSIS — R7303 Prediabetes: Secondary | ICD-10-CM

## 2024-01-18 DIAGNOSIS — Z79899 Other long term (current) drug therapy: Secondary | ICD-10-CM | POA: Insufficient documentation

## 2024-01-18 DIAGNOSIS — E538 Deficiency of other specified B group vitamins: Secondary | ICD-10-CM

## 2024-01-18 DIAGNOSIS — E890 Postprocedural hypothyroidism: Secondary | ICD-10-CM

## 2024-01-18 DIAGNOSIS — I10 Essential (primary) hypertension: Secondary | ICD-10-CM

## 2024-01-18 DIAGNOSIS — E7800 Pure hypercholesterolemia, unspecified: Secondary | ICD-10-CM

## 2024-01-18 NOTE — Telephone Encounter (Signed)
-----   Message from Harlene Du sent at 01/08/2024  9:40 AM EST ----- Regarding: Labs Fri 01/22/24 Hello,  Patient is coming in for CPE labs. Can we get orders please.   Thanks

## 2024-01-19 ENCOUNTER — Ambulatory Visit: Payer: Self-pay

## 2024-01-19 ENCOUNTER — Encounter (HOSPITAL_COMMUNITY)

## 2024-01-19 ENCOUNTER — Telehealth: Payer: Self-pay

## 2024-01-19 DIAGNOSIS — Z7901 Long term (current) use of anticoagulants: Secondary | ICD-10-CM

## 2024-01-19 LAB — POCT INR: INR: 2.8 (ref 2.0–3.0)

## 2024-01-19 NOTE — Progress Notes (Signed)
 Pt tests INR at home. INR today is 2.8 Continue 1 1/2 tablets daily. Recheck on 1/6.  LVM with dosing instructions and recheck date.

## 2024-01-19 NOTE — Telephone Encounter (Signed)
 Pt tests INR at home. LVM it is time to test.

## 2024-01-19 NOTE — Patient Instructions (Signed)
Pre visit review using our clinic review tool, if applicable. No additional management support is needed unless otherwise documented below in the visit note. 

## 2024-01-21 ENCOUNTER — Encounter (HOSPITAL_COMMUNITY)

## 2024-01-22 ENCOUNTER — Ambulatory Visit: Payer: Self-pay | Admitting: Family Medicine

## 2024-01-22 ENCOUNTER — Other Ambulatory Visit

## 2024-01-22 DIAGNOSIS — Z79899 Other long term (current) drug therapy: Secondary | ICD-10-CM | POA: Diagnosis not present

## 2024-01-22 DIAGNOSIS — E890 Postprocedural hypothyroidism: Secondary | ICD-10-CM

## 2024-01-22 DIAGNOSIS — R7303 Prediabetes: Secondary | ICD-10-CM

## 2024-01-22 DIAGNOSIS — D696 Thrombocytopenia, unspecified: Secondary | ICD-10-CM

## 2024-01-22 DIAGNOSIS — E538 Deficiency of other specified B group vitamins: Secondary | ICD-10-CM

## 2024-01-22 DIAGNOSIS — I10 Essential (primary) hypertension: Secondary | ICD-10-CM | POA: Diagnosis not present

## 2024-01-22 DIAGNOSIS — E7800 Pure hypercholesterolemia, unspecified: Secondary | ICD-10-CM

## 2024-01-22 LAB — LIPID PANEL
Cholesterol: 143 mg/dL (ref 28–200)
HDL: 57.8 mg/dL
LDL Cholesterol: 68 mg/dL (ref 10–99)
NonHDL: 85.35
Total CHOL/HDL Ratio: 2
Triglycerides: 89 mg/dL (ref 10.0–149.0)
VLDL: 17.8 mg/dL (ref 0.0–40.0)

## 2024-01-22 LAB — HEMOGLOBIN A1C: Hgb A1c MFr Bld: 6.3 % (ref 4.6–6.5)

## 2024-01-22 LAB — COMPREHENSIVE METABOLIC PANEL WITH GFR
ALT: 18 U/L (ref 3–35)
AST: 25 U/L (ref 5–37)
Albumin: 4 g/dL (ref 3.5–5.2)
Alkaline Phosphatase: 69 U/L (ref 39–117)
BUN: 13 mg/dL (ref 6–23)
CO2: 33 meq/L — ABNORMAL HIGH (ref 19–32)
Calcium: 9.4 mg/dL (ref 8.4–10.5)
Chloride: 105 meq/L (ref 96–112)
Creatinine, Ser: 0.99 mg/dL (ref 0.40–1.20)
GFR: 63.32 mL/min
Glucose, Bld: 105 mg/dL — ABNORMAL HIGH (ref 70–99)
Potassium: 4.5 meq/L (ref 3.5–5.1)
Sodium: 143 meq/L (ref 135–145)
Total Bilirubin: 0.4 mg/dL (ref 0.2–1.2)
Total Protein: 6.9 g/dL (ref 6.0–8.3)

## 2024-01-22 LAB — CBC WITH DIFFERENTIAL/PLATELET
Basophils Absolute: 0 K/uL (ref 0.0–0.1)
Basophils Relative: 0.6 % (ref 0.0–3.0)
Eosinophils Absolute: 0.3 K/uL (ref 0.0–0.7)
Eosinophils Relative: 4.9 % (ref 0.0–5.0)
HCT: 39.8 % (ref 36.0–46.0)
Hemoglobin: 12.6 g/dL (ref 12.0–15.0)
Lymphocytes Relative: 21.9 % (ref 12.0–46.0)
Lymphs Abs: 1.2 K/uL (ref 0.7–4.0)
MCHC: 31.8 g/dL (ref 30.0–36.0)
MCV: 84.8 fl (ref 78.0–100.0)
Monocytes Absolute: 0.4 K/uL (ref 0.1–1.0)
Monocytes Relative: 6.6 % (ref 3.0–12.0)
Neutro Abs: 3.5 K/uL (ref 1.4–7.7)
Neutrophils Relative %: 66 % (ref 43.0–77.0)
Platelets: 155 K/uL (ref 150.0–400.0)
RBC: 4.69 Mil/uL (ref 3.87–5.11)
RDW: 16.3 % — ABNORMAL HIGH (ref 11.5–15.5)
WBC: 5.3 K/uL (ref 4.0–10.5)

## 2024-01-22 LAB — TSH: TSH: 15.66 u[IU]/mL — ABNORMAL HIGH (ref 0.35–5.50)

## 2024-01-22 LAB — VITAMIN D 25 HYDROXY (VIT D DEFICIENCY, FRACTURES): VITD: 21.84 ng/mL — ABNORMAL LOW (ref 30.00–100.00)

## 2024-01-22 LAB — VITAMIN B12: Vitamin B-12: 1094 pg/mL — ABNORMAL HIGH (ref 211–911)

## 2024-01-26 ENCOUNTER — Encounter (HOSPITAL_COMMUNITY)

## 2024-02-02 ENCOUNTER — Other Ambulatory Visit: Payer: Self-pay | Admitting: Family Medicine

## 2024-02-02 ENCOUNTER — Ambulatory Visit (HOSPITAL_COMMUNITY)

## 2024-02-02 DIAGNOSIS — Z7901 Long term (current) use of anticoagulants: Secondary | ICD-10-CM

## 2024-02-02 MED ORDER — WARFARIN SODIUM 7.5 MG PO TABS: ORAL_TABLET | ORAL | 0 refills | Status: AC

## 2024-02-02 NOTE — Addendum Note (Signed)
 Addended by: RANDEEN HARDY A on: 02/02/2024 02:12 PM   Modules accepted: Orders

## 2024-02-03 ENCOUNTER — Encounter: Payer: Self-pay | Admitting: Hematology and Oncology

## 2024-02-05 ENCOUNTER — Telehealth (HOSPITAL_BASED_OUTPATIENT_CLINIC_OR_DEPARTMENT_OTHER): Payer: Self-pay | Admitting: *Deleted

## 2024-02-05 ENCOUNTER — Encounter: Admitting: Family Medicine

## 2024-02-05 ENCOUNTER — Other Ambulatory Visit: Payer: Self-pay | Admitting: Gastroenterology

## 2024-02-05 NOTE — Telephone Encounter (Signed)
 Pharmacy please advise on holding warfarin prior to colonoscopy/EGD scheduled for 03/07/2024. Thank you.   Last labs: 01/22/2024

## 2024-02-05 NOTE — Telephone Encounter (Signed)
 Pt is scheduled for echo 03/10/24. Pt will need echo to be moved up. Per preop APP Miriam Shams, NP pt will need tele after the echo has been completed.   I will update the requesting office.

## 2024-02-05 NOTE — Telephone Encounter (Signed)
 Patient was last seen by Delon Hoover in November and had an echo scheduled, not performed. Will need this completed prior to clearance recommendations. Once resulted, patient can be scheduled for a virtual visit.

## 2024-02-05 NOTE — Telephone Encounter (Signed)
"  ° °  Pre-operative Risk Assessment    Patient Name: [name]  DOB: January 20, 1966 MRN: [id number]   Date of last office visit: 12/23/23 DELON HOOVER, NP Date of next office visit: NONE   Request for Surgical Clearance    Procedure:  COLONOSCOPY AND ENDOSCOPY  Date of Surgery:  Clearance 03/07/24                                Surgeon:  DR. DIANNA Surgeon's Group or Practice Name:  [facility] Phone number:  [phone] Fax number:  [phone]   Type of Clearance Requested:   - Medical  - Pharmacy:  Hold Aspirin  and Warfarin (Coumadin )     Type of Anesthesia:  PROPOFOL    Additional requests/questions:    Bonney Niels Jest   02/05/2024, 12:06 PM   "

## 2024-02-08 NOTE — Telephone Encounter (Signed)
 RE: move up echo please ? see notes Received: Today [name], CMA Patient was originally scheduled on 12/30 and she rescheduled due to sickness. I will be glad to put on wait list and call if somethng comes open sooner. :( Sorry this is all I can do. I tried to call her this am to get her in tomorrow but no answer. :(       Previous Messages    ----- Message ----- From: [name], CMA Sent: 02/05/2024   2:46 PM EST To: [name] Cheryl Subject: move up echo please ? see notes                Hi Cheryl,  Happy New Year!!! See message below. Can you see if you can get echo moved up sooner. We will also need to schedule her a tele preop appt once we know when echo will be done.  Thank you Niels

## 2024-02-10 ENCOUNTER — Telehealth: Payer: Self-pay

## 2024-02-10 NOTE — Telephone Encounter (Signed)
 Pt tests INR at home. It is time for pt to test. LVM it is time to test.

## 2024-02-10 NOTE — Telephone Encounter (Signed)
 Pt has been scheduled echo 02/16/24 needed before she can be cleared for preop clearance.

## 2024-02-11 NOTE — Telephone Encounter (Signed)
LVM it is time to test INR

## 2024-02-12 ENCOUNTER — Telehealth: Payer: Self-pay | Admitting: *Deleted

## 2024-02-12 LAB — POCT INR: INR: 3.8 — AB (ref 2.0–3.0)

## 2024-02-12 NOTE — Telephone Encounter (Signed)
 Pt no-showed her recent CPE, per Dr. Randeen she would like pt to get that appt r/s, thanks

## 2024-02-12 NOTE — Telephone Encounter (Signed)
 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    Procedure:  COLONOSCOPY AND ENDOSCOPY   Date of Surgery:  Clearance 03/07/24        CrCl 100 (with adjusted body weight) Platelet count 155  Per office protocol, patient can hold warfarin for 5 days prior to procedure.   Patient WILL need bridging with Lovenox  (enoxaparin ) around procedure.  INR not followed by [facility].  Will need to determine who is monitoring.    **This guidance is not considered finalized until pre-operative APP has relayed final recommendations.**

## 2024-02-12 NOTE — Telephone Encounter (Signed)
 LVM it is time to test INR.  Pt is scheduled for colonoscopy for 2/2. Per cardiology, pt needs an echo, which is scheduled for 1/13.   Received msg from PCP that a form for clearance to hold warfarin has been received from [facility].  Pt will be placed on a Lovenox  bridge for this surgery. Cannot create bridge instructions until closer to the surgery date due to fluctuating INR results. PCP is aware.

## 2024-02-15 ENCOUNTER — Ambulatory Visit: Payer: Self-pay

## 2024-02-15 ENCOUNTER — Encounter: Payer: Self-pay | Admitting: Hematology and Oncology

## 2024-02-15 DIAGNOSIS — Z7901 Long term (current) use of anticoagulants: Secondary | ICD-10-CM | POA: Diagnosis not present

## 2024-02-15 NOTE — Progress Notes (Signed)
 Pt tests INR at home. Pt is to test on Tuesdays. Pt was contacted on Wednesday of last week that it was time to test. Pt was also contacted on Thursday and Friday morning. Pt tested after hours on Friday, at 4:40. Result was not received until this morning.  Pt has not been compliant with INR testing at home. Have discussed this with pt in the past and the importance of testing on time.  Will bring to pt's attention again. If pt cannot test on-time home testing will be revoked and pt will need to come to the coumadin  clinic for testing. Pt scheduled for colonoscopy for 2/2 but is awaiting echo and surgical clearance from cardiology. Pt will be placed on a Lovenox  bridge. Hold warfarin today and then continue 1 1/2 tablets daily. Recheck on 1/19. LVM for pt to return call for instructions.

## 2024-02-15 NOTE — Patient Instructions (Signed)
Pre visit review using our clinic review tool, if applicable. No additional management support is needed unless otherwise documented below in the visit note. 

## 2024-02-15 NOTE — Progress Notes (Signed)
 Pt reports she is allergic to Lovenox . This is on her allergy list but listed as itching as the rx. She reports today that it started years ago as itching that was controlled at that time with Benadryl . She reports now she has a full body rash and has not used lovenox  in the past recently due to this.  Advised this will be brought to the providers attention and a warfarin dosing schedule will be created for around the surgery on 2/2.

## 2024-02-15 NOTE — Telephone Encounter (Signed)
 This has been RS while pt was on the phone for warfarin dosing instructions.

## 2024-02-15 NOTE — Telephone Encounter (Signed)
 Pt tested INR today.  Pt is scheduled for colonoscopy and EGD for 2/2.  Spoke to pt today and she reported she is allergic to Lovenox . This is on her allergy list but listed as itching as the rx. She reports today that it started years ago as itching that was controlled at that time with Benadryl . She reports now she has a full body rash and has not used lovenox  in the recent past due to this.   Advised this will be brought to the providers attention and a warfarin dosing schedule will be created for around the surgery on 2/2.    Recommendation is not to place pt on a Lovenox  bridge. Will dose warfarin around surgery for optimal protection but no Lovenox  will be used.   RS pt's cpe she missed on 1/2 with PCP to 02/24/24

## 2024-02-16 ENCOUNTER — Ambulatory Visit: Payer: Self-pay | Admitting: Cardiovascular Disease

## 2024-02-16 ENCOUNTER — Ambulatory Visit (HOSPITAL_COMMUNITY)
Admission: RE | Admit: 2024-02-16 | Discharge: 2024-02-16 | Disposition: A | Source: Ambulatory Visit | Attending: Cardiology | Admitting: Cardiology

## 2024-02-16 DIAGNOSIS — Z952 Presence of prosthetic heart valve: Secondary | ICD-10-CM | POA: Insufficient documentation

## 2024-02-16 DIAGNOSIS — I350 Nonrheumatic aortic (valve) stenosis: Secondary | ICD-10-CM | POA: Insufficient documentation

## 2024-02-16 DIAGNOSIS — I517 Cardiomegaly: Secondary | ICD-10-CM | POA: Diagnosis not present

## 2024-02-16 DIAGNOSIS — I3481 Nonrheumatic mitral (valve) annulus calcification: Secondary | ICD-10-CM | POA: Diagnosis not present

## 2024-02-16 LAB — ECHOCARDIOGRAM COMPLETE
AR max vel: 1.57 cm2
AV Area VTI: 1.69 cm2
AV Area mean vel: 1.45 cm2
AV Mean grad: 19 mmHg
AV Peak grad: 35.4 mmHg
Ao pk vel: 2.98 m/s
Area-P 1/2: 5.27 cm2
Calc EF: 68.9 %
S' Lateral: 2.8 cm
Single Plane A2C EF: 71.2 %
Single Plane A4C EF: 68.6 %

## 2024-02-16 NOTE — Telephone Encounter (Signed)
 Aware, thanks!

## 2024-02-17 NOTE — Telephone Encounter (Signed)
" ° °  Name: [name]  DOB: Jan 30, 1966  MRN: [id number]  Primary Cardiologist: Vina Gull, MD   Preoperative team, please contact this patient and set up a phone call appointment for further preoperative risk assessment. Please obtain consent and complete medication review. Thank you for your help.  I confirm that guidance regarding antiplatelet and oral anticoagulation therapy has been completed and, if necessary, noted below: Per office protocol, patient can hold warfarin for 5 days prior to procedure.  Patient WILL need bridging with Lovenox  (enoxaparin ) around procedure. INR not followed by [facility].  Will need to determine who is monitoring.   I also confirmed the patient resides in the state of [place] . As per [facility] Medical Board telemedicine laws, the patient must reside in the state in which the provider is licensed.   Tiffanni Scarfo E Ritika Hellickson, PA-C 02/17/2024, 8:36 AM [facility]    "

## 2024-02-17 NOTE — Telephone Encounter (Signed)
Left message to call back and schedule tele pre op appt

## 2024-02-19 ENCOUNTER — Telehealth (HOSPITAL_BASED_OUTPATIENT_CLINIC_OR_DEPARTMENT_OTHER): Payer: Self-pay | Admitting: *Deleted

## 2024-02-19 NOTE — Telephone Encounter (Signed)
 Pt has been scheduled tele preop appt 02/25/24. Med rec and consent are done. In regard to lovenox  bridge, pt tells me that she is allergic to lovenox .   I stated that I will have one of the Pharm-d call her about the Lovenox .       Patient Consent for Virtual Visit        [name] has provided verbal consent on 02/19/2024 for a virtual visit (video or telephone).   CONSENT FOR VIRTUAL VISIT FOR:  [name]  By participating in this virtual visit I agree to the following:  I hereby voluntarily request, consent and authorize [facility] and its employed or contracted physicians, physician assistants, nurse practitioners or other licensed health care professionals (the Practitioner), to provide me with telemedicine health care services (the Services) as deemed necessary by the treating Practitioner. I acknowledge and consent to receive the Services by the Practitioner via telemedicine. I understand that the telemedicine visit will involve communicating with the Practitioner through live audiovisual communication technology and the disclosure of certain medical information by electronic transmission. I acknowledge that I have been given the opportunity to request an in-person assessment or other available alternative prior to the telemedicine visit and am voluntarily participating in the telemedicine visit.  I understand that I have the right to withhold or withdraw my consent to the use of telemedicine in the course of my care at any time, without affecting my right to future care or treatment, and that the Practitioner or I may terminate the telemedicine visit at any time. I understand that I have the right to inspect all information obtained and/or recorded in the course of the telemedicine visit and may receive copies of available information for a reasonable fee.  I understand that some of the potential risks of receiving the Services via telemedicine include:  Delay or  interruption in medical evaluation due to technological equipment failure or disruption; Information transmitted may not be sufficient (e.g. poor resolution of images) to allow for appropriate medical decision making by the Practitioner; and/or  In rare instances, security protocols could fail, causing a breach of personal health information.  Furthermore, I acknowledge that it is my responsibility to provide information about my medical history, conditions and care that is complete and accurate to the best of my ability. I acknowledge that Practitioner's advice, recommendations, and/or decision may be based on factors not within their control, such as incomplete or inaccurate data provided by me or distortions of diagnostic images or specimens that may result from electronic transmissions. I understand that the practice of medicine is not an exact science and that Practitioner makes no warranties or guarantees regarding treatment outcomes. I acknowledge that a copy of this consent can be made available to me via my patient portal [facility] MyChart), or I can request a printed copy by calling the office of [facility].    I understand that my insurance will be billed for this visit.   I have read or had this consent read to me. I understand the contents of this consent, which adequately explains the benefits and risks of the Services being provided via telemedicine.  I have been provided ample opportunity to ask questions regarding this consent and the Services and have had my questions answered to my satisfaction. I give my informed consent for the services to be provided through the use of telemedicine in my medical care

## 2024-02-19 NOTE — Telephone Encounter (Signed)
 Pt has been scheduled tele preop appt 02/25/24. Med rec and consent are done. In regard to lovenox  bridge, pt tells me that she is allergic to lovenox .   I stated that I will have one of the Pharm-d call her about the Lovenox .

## 2024-02-19 NOTE — Telephone Encounter (Signed)
 Her warfarin is managed by PCP. Her major indication for warfarin is not cardiac. Per note 02/10/24 by Clotilda Public, PCP is aware that no bridging will be used due to full body rash.  Spoke with patient about this and made her aware that we had not realized the severity of her reaction.

## 2024-02-22 ENCOUNTER — Ambulatory Visit: Payer: Self-pay

## 2024-02-22 DIAGNOSIS — Z7901 Long term (current) use of anticoagulants: Secondary | ICD-10-CM | POA: Diagnosis not present

## 2024-02-22 LAB — POCT INR: INR: 3.5 — AB (ref 2.0–3.0)

## 2024-02-22 NOTE — Patient Instructions (Addendum)
 Pre visit review using our clinic review tool, if applicable. No additional management support is needed unless otherwise documented below in the visit note.  Continue 1 1/2 tablets daily. Recheck on 1/26.

## 2024-02-22 NOTE — Progress Notes (Signed)
 Pt tests INR at home.  Pt scheduled for colonoscopy for 2/2. Pt would normally be placed on a Lovenox  bridge but pt is allergic to Lovenox .  Continue 1 1/2 tablets daily. Recheck on 1/26. Contacted pt by phone and advised of dosing and recheck date. Pt verbalized understanding.  Will provide warfarin dosing instructions with next INR result.

## 2024-02-24 ENCOUNTER — Encounter: Payer: Self-pay | Admitting: Family Medicine

## 2024-02-24 ENCOUNTER — Ambulatory Visit: Admitting: Family Medicine

## 2024-02-24 VITALS — BP 126/74 | HR 69 | Temp 97.9°F | Ht 63.5 in | Wt 371.0 lb

## 2024-02-24 DIAGNOSIS — F411 Generalized anxiety disorder: Secondary | ICD-10-CM

## 2024-02-24 DIAGNOSIS — R7303 Prediabetes: Secondary | ICD-10-CM | POA: Diagnosis not present

## 2024-02-24 DIAGNOSIS — Z7901 Long term (current) use of anticoagulants: Secondary | ICD-10-CM

## 2024-02-24 DIAGNOSIS — N182 Chronic kidney disease, stage 2 (mild): Secondary | ICD-10-CM

## 2024-02-24 DIAGNOSIS — E890 Postprocedural hypothyroidism: Secondary | ICD-10-CM

## 2024-02-24 DIAGNOSIS — E538 Deficiency of other specified B group vitamins: Secondary | ICD-10-CM | POA: Diagnosis not present

## 2024-02-24 DIAGNOSIS — Z0000 Encounter for general adult medical examination without abnormal findings: Secondary | ICD-10-CM | POA: Diagnosis not present

## 2024-02-24 DIAGNOSIS — E7800 Pure hypercholesterolemia, unspecified: Secondary | ICD-10-CM | POA: Diagnosis not present

## 2024-02-24 DIAGNOSIS — Z79899 Other long term (current) drug therapy: Secondary | ICD-10-CM | POA: Diagnosis not present

## 2024-02-24 DIAGNOSIS — D508 Other iron deficiency anemias: Secondary | ICD-10-CM

## 2024-02-24 DIAGNOSIS — I10 Essential (primary) hypertension: Secondary | ICD-10-CM

## 2024-02-24 DIAGNOSIS — Z23 Encounter for immunization: Secondary | ICD-10-CM | POA: Diagnosis not present

## 2024-02-24 DIAGNOSIS — M3214 Glomerular disease in systemic lupus erythematosus: Secondary | ICD-10-CM

## 2024-02-24 DIAGNOSIS — E559 Vitamin D deficiency, unspecified: Secondary | ICD-10-CM | POA: Diagnosis not present

## 2024-02-24 DIAGNOSIS — M3213 Lung involvement in systemic lupus erythematosus: Secondary | ICD-10-CM

## 2024-02-24 MED ORDER — CYANOCOBALAMIN 1000 MCG/ML IJ SOLN: 1000.0000 ug | INTRAMUSCULAR | 5 refills | Status: AC

## 2024-02-24 NOTE — Assessment & Plan Note (Signed)
 Disc goals for lipids and reasons to control them Rev last labs with pt Rev low sat fat diet in detail LDL down to 68   Zetia  10 mg daily  Crestor  40 mg daily

## 2024-02-24 NOTE — Assessment & Plan Note (Signed)
 Recent GFR improved at 63.3  Nephrology care

## 2024-02-24 NOTE — Assessment & Plan Note (Signed)
 Bmi 65.69 Could not get glp-1 covered so far   Discussed how this problem influences overall health and the risks it imposes  Reviewed plan for weight loss with lower calorie diet (via better food choices (lower glycemic and portion control) along with exercise building up to or more than 30 minutes 5 days per week including some aerobic activity and strength training

## 2024-02-24 NOTE — Assessment & Plan Note (Signed)
 Continues plaquenil and rheum care

## 2024-02-24 NOTE — Assessment & Plan Note (Signed)
 Lab Results  Component Value Date   VITAMINB12 1,094 (H) 01/22/2024    Will extend shots to every 8 weeks

## 2024-02-24 NOTE — Assessment & Plan Note (Signed)
 Warfarin  Will have to hold for colonoscopy  Is allergic to lovenox  so no bridge

## 2024-02-24 NOTE — Assessment & Plan Note (Signed)
 Per pt stable with paxil  40 mg daily despite increase in stress Working on self care

## 2024-02-24 NOTE — Assessment & Plan Note (Signed)
 Good GFR recent of 64 Encouraged fluid intake

## 2024-02-24 NOTE — Assessment & Plan Note (Signed)
 Lab Results  Component Value Date   HGBA1C 6.3 01/22/2024   HGBA1C 6.3 (H) 07/15/2023   HGBA1C 6.4 01/15/2023   disc imp of low glycemic diet and wt loss to prevent DM2

## 2024-02-24 NOTE — Progress Notes (Signed)
 "  Subjective:    Patient ID: [name], female    DOB: 10/21/1965, 59 y.o.   MRN: [id number]  HPI  Here for health maintenance exam and to review chronic medical problems   Wt Readings from Last 3 Encounters:  02/24/24 (!) 371 lb (168.3 kg)  01/14/24 (!) 368 lb 1.6 oz (167 kg)  12/23/23 (!) 365 lb (165.6 kg)   64.69 kg/m  Vitals:   02/24/24 1035  BP: 126/74  Pulse: 69  Temp: 97.9 F (36.6 C)  SpO2: 93%    Immunization History  Administered Date(s) Administered   Influenza Whole 12/25/2008, 11/12/2010, 11/08/2011   Influenza, Seasonal, Injecte, Preservative Fre 01/15/2023   Influenza,inj,Quad PF,6+ Mos 12/07/2012, 03/04/2016, 11/16/2017, 01/04/2019, 10/23/2020   Influenza-Unspecified 11/03/2013   PFIZER(Purple Top)SARS-COV-2 Vaccination 04/16/2019, 05/07/2019, 01/06/2020   Pfizer Covid-19 Vaccine Bivalent Booster 61yrs & up 01/23/2021   Td 02/24/2024   Tdap 02/28/2014    Health Maintenance Due  Topic Date Due   Hepatitis B Vaccines 19-59 Average Risk (1 of 3 - 19+ 3-dose series) Never done   Colonoscopy  Never done   Mammogram  06/23/2023    Declines flu shot and pna shot  Shingrix-declines  Tetanus shot 02/2014- is due/ declines    Mammogram 06/2022 - [facility]/is up to date  Self breast exam- no lumps  Utd breast exam   Gyn health Pap 05/2022  [facility] gyn [facility] -[facility]   Colon cancer screening  Scheduled for colonoscopy 2/2   (also EGD) for the issues she is having  Will have to hold warfarin  Is allergic to lovenox     Bone health  Dexa  normal 2015  Falls- one  Fractures-none  Supplements -bariatric vitamin that has vit D  Last vitamin D  Lab Results  Component Value Date   VD25OH 21.84 (L) 01/22/2024    Exercise  Not a whole lot  Some stretching   Breathing limits her  For cardio would choose walking , is interested in water exercise     Mood    02/24/2024   10:40 AM 09/23/2023    1:02 PM 01/15/2023   10:29  AM 10/23/2021   11:10 AM 09/20/2020   10:02 AM  Depression screen PHQ 2/9  Decreased Interest 0 0 1 0 0  Down, Depressed, Hopeless 1 0 1 0 0  PHQ - 2 Score 1 0 2 0 0  Altered sleeping 1 1 1 1    Tired, decreased energy 2 1 3 1    Change in appetite 2 0 2 1   Feeling bad or failure about yourself  0 0 0 0   Trouble concentrating 0 0 0 0   Moving slowly or fidgety/restless 0 0 0 0   Suicidal thoughts 0 0 0 0   PHQ-9 Score 6 2  8  3     Difficult doing work/chores Somewhat difficult Not difficult at all Very difficult       Data saved with a previous flowsheet row definition    Paxil  40 mg daily for GAD Has done well with it   More stress since the fall  Family stress/ daughter ,grand daughter   HTN bp is stable today  No cp or palpitations or headaches or edema  No side effects to medicines  BP Readings from Last 3 Encounters:  02/24/24 126/74  01/14/24 110/70  12/23/23 138/82     Ckd with lupus nephritis   Lab Results  Component Value Date  NA 143 01/22/2024   K 4.5 01/22/2024   CO2 33 (H) 01/22/2024   GLUCOSE 105 (H) 01/22/2024   BUN 13 01/22/2024   CREATININE 0.99 01/22/2024   CALCIUM  9.4 01/22/2024   GFR 63.32 01/22/2024   EGFR 64.0 01/11/2024   GFRNONAA >60 07/01/2023   Metoprolol  50 mg bid Lasix  20 mg daily   Hypothyroidism (post ablation)  Pt has no clinical changes No change in energy level/ hair or skin/ edema and no tremor Lab Results  Component Value Date   TSH 15.66 (H) 01/22/2024    Levothyroxine  175 mcg daily Had missed doses , is now back on track     Hyperlipidemia Lab Results  Component Value Date   CHOL 143 01/22/2024   CHOL 161 01/15/2023   CHOL 144 10/16/2021   Lab Results  Component Value Date   HDL 57.80 01/22/2024   HDL 55.40 01/15/2023   HDL 51.30 10/16/2021   Lab Results  Component Value Date   LDLCALC 68 01/22/2024   LDLCALC 84 01/15/2023   LDLCALC 71 10/16/2021   Lab Results  Component Value Date   TRIG 89.0  01/22/2024   TRIG 109.0 01/15/2023   TRIG 107.0 10/16/2021   Lab Results  Component Value Date   CHOLHDL 2 01/22/2024   CHOLHDL 3 01/15/2023   CHOLHDL 3 10/16/2021   Lab Results  Component Value Date   LDLDIRECT 203.7 06/03/2011   LDLDIRECT 241.9 07/09/2009   LDLDIRECT 184.8 12/25/2008   Zetia  10 mg daily  Crestor  40 mg daily   Trying to watch her diet  Cannot eat a lot of fatty food   Prediabetes Lab Results  Component Value Date   HGBA1C 6.3 01/22/2024   HGBA1C 6.3 (H) 07/15/2023   HGBA1C 6.4 01/15/2023     History of iron  def Bariatric surgery in past Vitamin has iron  in it  Lab Results  Component Value Date   WBC 5.3 01/22/2024   HGB 12.6 01/22/2024   HCT 39.8 01/22/2024   MCV 84.8 01/22/2024   PLT 155.0 01/22/2024    Lab Results  Component Value Date   IRON  69 01/15/2023   TIBC 377 09/30/2021   FERRITIN 58 09/30/2021     On ppi  Omeprazole  40 mg daily  Lab Results  Component Value Date   VITAMINB12 1,094 (H) 01/22/2024   Shots - every 6 weeks    Lab Results  Component Value Date   ALT 18 01/22/2024   AST 25 01/22/2024   ALKPHOS 69 01/22/2024   BILITOT 0.4 01/22/2024      Patient Active Problem List   Diagnosis Date Noted   Vitamin D  deficiency 02/24/2024   Current use of proton pump inhibitor 01/18/2024   Hypoxia 09/25/2023   Cough 07/30/2023   Hemoptysis 07/30/2023   History of pneumonia 07/15/2023   Blister of right lower extremity 07/15/2023   Encounter for hepatitis C screening test for low risk patient 01/15/2023   Globus sensation 10/23/2021   History of bariatric surgery 01/08/2021   IDA (iron  deficiency anemia) 06/28/2020   Iron  deficiency anemia 06/18/2020   History of atrial fibrillation 06/18/2020   S/P aortic valve replacement with bioprosthetic valve 04/06/2020   Coronary artery disease 04/06/2020   Chest pain of uncertain etiology 01/17/2020   Esophageal diverticulum    Esophageal dysphagia    Fatigue  05/20/2019   Systemic lupus erythematosus with lung involvement (HCC) 01/04/2019   Aortic stenosis 08/15/2017   Vitamin B12 deficiency 11/15/2015   Allergic  rhinitis 07/01/2015   History of cerebral hemorrhage 07/01/2015   Chronic daily headache 06/29/2015   Encounter for therapeutic drug monitoring 03/25/2013   CKD (chronic kidney disease) stage 2, GFR 60-89 ml/min 12/20/2012   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   Routine general medical examination at a health care facility 12/07/2012   History of HPV infection 12/07/2012   Long term (current) use of anticoagulants 10/06/2012   Chronic anticoagulation 04/22/2012   S/P bariatric surgery 04/22/2012   Homozygous Factor V Leiden mutation 04/22/2012   OSA on CPAP 11/20/2011   Prediabetes 09/16/2010   EDEMA 01/08/2010   Pleural effusion 08/27/2009   Enlarged lymph nodes 08/13/2009   History of pulmonary embolism 05/24/2009   Morbid obesity (HCC) 12/08/2007   Hypothyroidism 06/09/2006   HYPERCHOLESTEROLEMIA 06/09/2006   Generalized anxiety disorder 06/09/2006   PANIC DISORDER 06/09/2006   Essential hypertension 06/09/2006   Nephritis and nephropathy, with pathological lesion in kidney 06/09/2006   Systemic lupus erythematosus (HCC) 06/09/2006   DVT, HX OF 06/09/2006   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    Loculated-chronic on Left-VanTright; s/p VATS 5/10   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   HLD (hyperlipidemia)    Iron  deficiency anemia    IV dextran [place]   Nephritis and nephropathy, not specified as acute or chronic, with unspecified pathological lesion in kidney     Nonspecific abnormal results of liver function study    Obesity, unspecified    Other specified acquired hypothyroidism    Panic disorder without agoraphobia    Personal history of venous thrombosis and embolism 2002   during pregnancy; ?factor 5 leiden (sees heme)   Pleural effusion 2005   c/w lupus initial w/u; recurrent Right as pred tapered off July 2011   Sleep apnea    Systemic lupus erythematosus (HCC)    renal GN, hx of pericardial eff in late 90's   Unspecified essential hypertension    Past Surgical History:  Procedure Laterality Date   AORTIC VALVE REPLACEMENT N/A 04/06/2020   Procedure: AORTIC VALVE REPLACEMENT (AVR) USING INSPIRIS 21 MM AORTIC VALVE;  Surgeon: Lucas Dorise POUR, MD;  Location: [facility] OR;  Service: Open Heart Surgery;  Laterality: N/A;   BIOPSY  04/12/2019   Procedure: BIOPSY;  Surgeon: Eda Iha, MD;  Location: [facility];  Service: Gastroenterology;;   CARDIAC CATHETERIZATION     CARDIAC VALVE REPLACEMENT  [phone]   ESOPHAGEAL MANOMETRY N/A 07/15/2019   Procedure: ESOPHAGEAL MANOMETRY (EM);  Surgeon: Shila Gustav GAILS, MD;  Location: [facility];  Service: Endoscopy;  Laterality: N/A;   ESOPHAGOGASTRODUODENOSCOPY (EGD) WITH PROPOFOL  N/A 04/12/2019   Procedure: ESOPHAGOGASTRODUODENOSCOPY (EGD) WITH PROPOFOL ;  Surgeon: Eda Iha, MD;  Location: [facility];  Service: Gastroenterology;  Laterality: N/A;   gastric sleeve  09/04/2010   bariatric surgery Dr Dela    LAPAROSCOPIC TUBAL LIGATION  12/09/2010   Procedure: LAPAROSCOPIC TUBAL LIGATION;  Surgeon: Norleen GORMAN Skill;  Location: [facility];  Service: Gynecology;  Laterality: Bilateral;  Attempted see Nursing note   pleurocentesis  10/04/2004   Pleuryx catheter placement  10/04/2009   VanTright----removed 9/11   RENAL BIOPSY  09/24/2012   RIGHT/LEFT HEART CATH AND CORONARY ANGIOGRAPHY N/A 01/19/2020   Procedure: RIGHT/LEFT HEART CATH AND CORONARY ANGIOGRAPHY;  Surgeon: Court Dorn PARAS, MD;   Location: [facility];  Service: Cardiovascular;  Laterality: N/A;   svd     x 3   TEE WITHOUT CARDIOVERSION N/A 04/06/2020   Procedure: TRANSESOPHAGEAL ECHOCARDIOGRAM (TEE);  Surgeon: Lucas Dorise POUR, MD;  Location: [facility] OR;  Service: Open Heart Surgery;  Laterality: N/A;   THORACENTESIS  02/04/2008   with penumonia   WISDOM TOOTH EXTRACTION     Social History[1] Family History  Problem Relation Age of Onset   Lupus Sister    Obesity Mother    Hypertension Father    Hyperlipidemia Father    Obesity Father    Leukemia Sister    Diabetes Other        GM   Colon cancer Neg Hx    Esophageal cancer Neg Hx    Pancreatic cancer Neg Hx    Allergies[2] Medications Ordered Prior to Encounter[3]  Review of Systems  Constitutional:  Positive for fatigue. Negative for activity change, appetite change, fever and unexpected weight change.  HENT:  Negative for congestion, ear pain, rhinorrhea, sinus pressure and sore throat.   Eyes:  Negative for pain, redness and visual disturbance.  Respiratory:  Negative for cough, shortness of breath and wheezing.        Poor exercise tolerance   Cardiovascular:  Negative for chest pain and palpitations.  Gastrointestinal:  Negative for abdominal pain, blood in stool, constipation and diarrhea.  Endocrine: Negative for polydipsia and polyuria.  Genitourinary:  Negative for dysuria, frequency and urgency.  Musculoskeletal:  Positive for arthralgias. Negative for back pain and myalgias.  Skin:  Negative for pallor and rash.  Allergic/Immunologic: Negative for environmental allergies.  Neurological:  Negative for dizziness, syncope and headaches.  Hematological:  Negative for adenopathy. Does not bruise/bleed easily.  Psychiatric/Behavioral:  Negative for decreased concentration and dysphoric mood. The patient is not nervous/anxious.        High stress level        Objective:   Physical Exam Constitutional:      General: She is not in acute  distress.    Appearance: Normal appearance. She is well-developed. She is obese. She is not ill-appearing or diaphoretic.  HENT:     Head: Normocephalic and atraumatic.     Right Ear: Tympanic membrane, ear canal and external ear normal.     Left Ear: Tympanic membrane, ear canal and external ear normal.     Nose: Nose normal. No congestion.     Mouth/Throat:     Mouth: Mucous membranes are moist.     Pharynx: Oropharynx is clear. No posterior oropharyngeal erythema.  Eyes:     General: No scleral icterus.    Extraocular Movements: Extraocular movements intact.     Conjunctiva/sclera: Conjunctivae normal.     Pupils: Pupils are equal, round, and reactive to light.  Neck:     Thyroid : No thyromegaly.     Vascular: No carotid bruit or JVD.  Cardiovascular:     Rate and Rhythm: Normal rate and regular rhythm.     Pulses: Normal pulses.     Heart sounds: Murmur heard.     No gallop.  Pulmonary:     Effort: Pulmonary effort is normal. No respiratory distress.     Breath sounds: Normal breath sounds. No wheezing.  Comments: Bs are mildly distant No wheezing  Chest:     Chest wall: No tenderness.  Abdominal:     General: Bowel sounds are normal. There is no distension or abdominal bruit.     Palpations: Abdomen is soft. There is no mass.     Tenderness: There is no abdominal tenderness.     Hernia: No hernia is present.  Genitourinary:    Comments: Breast and pelvic exam are done by gyn provider   Musculoskeletal:        General: No tenderness. Normal range of motion.     Cervical back: Normal range of motion and neck supple. No rigidity. No muscular tenderness.     Right lower leg: No edema.     Left lower leg: No edema.     Comments: No kyphosis   Lymphadenopathy:     Cervical: No cervical adenopathy.  Skin:    General: Skin is warm and dry.     Coloration: Skin is not pale.     Findings: No erythema or rash.     Comments: Solar lentigines diffusely Scattered sks    Baseline hyperpigmentation of ankles   Neurological:     Mental Status: She is alert. Mental status is at baseline.     Cranial Nerves: No cranial nerve deficit.     Motor: No abnormal muscle tone.     Coordination: Coordination normal.     Gait: Gait normal.     Deep Tendon Reflexes: Reflexes are normal and symmetric. Reflexes normal.  Psychiatric:        Mood and Affect: Mood normal.        Cognition and Memory: Cognition and memory normal.           Assessment & Plan:   Problem List Items Addressed This Visit       Cardiovascular and Mediastinum   Essential hypertension   bp in fair control at this time  BP Readings from Last 1 Encounters:  02/24/24 126/74   No changes needed Most recent labs reviewed  Disc lifstyle change with low sodium diet and exercise  metoprool 50 mg bid  Lasix  20 mg daily    Weight loss would help        Endocrine   Hypothyroidism   Lab Results  Component Value Date   TSH 15.66 (H) 01/22/2024   Missed doses of levothyroxine  175 mcg Will get back on track  Reviewed how to take correctly  Re check 1 mo       Relevant Orders   TSH     Genitourinary   CKD (chronic kidney disease) stage 2, GFR 60-89 ml/min   Recent GFR improved at 63.3  Nephrology care       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HCC)   Good GFR recent of 64 Encouraged fluid intake         Other   Vitamin D  deficiency   Last vitamin D  Lab Results  Component Value Date   VD25OH 21.84 (L) 01/22/2024    Instructed to add 2000 international units D3 to what she is already taking       Vitamin B12 deficiency   Lab Results  Component Value Date   VITAMINB12 1,094 (H) 01/22/2024    Will extend shots to every 8 weeks       Systemic lupus erythematosus (HCC)   Continues plaquenil  and rheum care       Routine general medical examination at a health care facility - Primary  Reviewed health habits including diet and exercise and skin cancer  prevention Reviewed appropriate screening tests for age  Also reviewed health mt list, fam hx and immunization status , as well as social and family history   See HPI Labs reviewed and ordered Health Maintenance  Topic Date Due   Hepatitis B Vaccine (1 of 3 - 19+ 3-dose series) Never done   Colon Cancer Screening  Never done   Breast Cancer Screening  06/23/2023   Flu Shot  05/03/2024*   Zoster (Shingles) Vaccine (1 of 2) 10/14/2024*   Pneumococcal Vaccine for age over 15 (1 of 2 - PCV) 01/13/2025*   COVID-19 Vaccine (5 - 2025-26 season) 03/11/2026*   Pap with HPV screening  06/24/2027   DTaP/Tdap/Td vaccine (3 - Td or Tdap) 02/23/2034   HPV Vaccine (No Doses Required) Completed   Hepatitis C Screening  Completed   HIV Screening  Completed   Meningitis B Vaccine  Aged Out  *Topic was postponed. The date shown is not the original due date.    Declines flu, pna, and shingrix vaccines  Td updated  Sent for pap/mammo reports Discussed fall prevention, supplements and exercise for bone density  PHQ 2 with treatment  Discussed weight loss struggles       Prediabetes   Lab Results  Component Value Date   HGBA1C 6.3 01/22/2024   HGBA1C 6.3 (H) 07/15/2023   HGBA1C 6.4 01/15/2023   disc imp of low glycemic diet and wt loss to prevent DM2       Morbid obesity (HCC)   Bmi 65.69 Could not get glp-1 covered so far   Discussed how this problem influences overall health and the risks it imposes  Reviewed plan for weight loss with lower calorie diet (via better food choices (lower glycemic and portion control) along with exercise building up to or more than 30 minutes 5 days per week including some aerobic activity and strength training         Iron  deficiency anemia   Normal cbc in dec  Takes bariatric vitamin with iron        Relevant Medications   cyanocobalamin  (VITAMIN B12) 1000 MCG/ML injection   HYPERCHOLESTEROLEMIA   Disc goals for lipids and reasons to control  them Rev last labs with pt Rev low sat fat diet in detail LDL down to 68   Zetia  10 mg daily  Crestor  40 mg daily         Generalized anxiety disorder   Per pt stable with paxil  40 mg daily despite increase in stress Working on self care       Current use of proton pump inhibitor   Lab Results  Component Value Date   VITAMINB12 1,094 (H) 01/22/2024    Last vitamin D  Lab Results  Component Value Date   VD25OH 21.84 (L) 01/22/2024    Will increase D by 2000 international units daily  Decrease B12 to q 8 wk      Chronic anticoagulation   Warfarin  Will have to hold for colonoscopy  Is allergic to lovenox  so no bridge       Other Visit Diagnoses       B12 deficiency       Relevant Medications   cyanocobalamin  (VITAMIN B12) 1000 MCG/ML injection     Need for Td vaccine       Relevant Orders   Td : Tetanus/diphtheria >7yo Preservative  free (Completed)         [1]  Social History Tobacco Use   Smoking status: Former    Current packs/day: 0.00    Average packs/day: 0.5 packs/day for 10.0 years (5.0 ttl pk-yrs)    Types: Cigarettes    Start date: 05/05/1999    Quit date: 05/04/2009    Years since quitting: 14.8   Smokeless tobacco: Never   Tobacco comments:    Socially x10 years (1 pack/month)  Vaping Use   Vaping status: Never Used  Substance Use Topics   Alcohol use: Yes    Comment: rare   Drug use: No  [2]  Allergies Allergen Reactions   Lisinopril  Cough   Lovenox  [Enoxaparin  Sodium] Itching   Moxifloxacin Other (See Comments)    REACTION: hallucinations   Lovenox  [Enoxaparin ]     Itching   [3]  Current Outpatient Medications on File Prior to Visit  Medication Sig Dispense Refill   albuterol  (VENTOLIN  HFA) 108 (90 Base) MCG/ACT inhaler Inhale 1-2 puffs into the lungs every 6 (six) hours as needed for wheezing or shortness of breath. 8.5 g 3   aspirin  EC 81 MG EC tablet Take 1 tablet (81 mg total) by mouth daily. Swallow whole. 30 tablet 11    bismuth  subsalicylate (PEPTO BISMOL) 262 MG/15ML suspension Take 30 mLs by mouth every 6 (six) hours as needed for indigestion or diarrhea or loose stools.     budesonide-formoterol (SYMBICORT) 160-4.5 MCG/ACT inhaler Inhale 2 puffs into the lungs 2 (two) times daily. 1 each 3   calcium  carbonate (TUMS - DOSED IN MG ELEMENTAL CALCIUM ) 500 MG chewable tablet Chew 1-2 tablets by mouth 3 (three) times daily as needed for indigestion or heartburn.     chlorhexidine  (PERIDEX ) 0.12 % solution AFTER BRUSHING, RINSE WITH 1 CAPFUL FOR 1 MINUTE TWICE A DAY THEN SPIT OUT     cholecalciferol (VITAMIN D3) 25 MCG (1000 UNIT) tablet Take 2,000 Units by mouth daily.     cyclobenzaprine  (FLEXERIL ) 10 MG tablet Take 1 tablet (10 mg total) by mouth 3 (three) times daily as needed. 15 tablet 0   ezetimibe  (ZETIA ) 10 MG tablet TAKE 1 TABLET BY MOUTH EVERY DAY 90 tablet 0   furosemide  (LASIX ) 20 MG tablet Take 20 mg by mouth daily.     hydroxychloroquine  (PLAQUENIL ) 200 MG tablet Take 200 mg by mouth 2 (two) times daily.     levothyroxine  (SYNTHROID ) 175 MCG tablet TAKE 1 TABLET BY MOUTH EVERY DAY BEFORE BREAKFAST 90 tablet 0   metoprolol  tartrate (LOPRESSOR ) 50 MG tablet TAKE 1 TABLET BY MOUTH TWICE A DAY 180 tablet 3   Multiple Vitamins-Minerals (BARIATRIC MULTIVITAMINS/IRON ) CAPS Take 1 tablet by mouth daily.     omeprazole  (PRILOSEC) 40 MG capsule TAKE 1 CAPSULE (40 MG TOTAL) BY MOUTH DAILY. 90 capsule 0   PARoxetine  (PAXIL ) 40 MG tablet TAKE 1 TABLET BY MOUTH EVERY DAY IN THE MORNING 90 tablet 0   rosuvastatin  (CRESTOR ) 40 MG tablet TAKE 1 TABLET BY MOUTH EVERY DAY 15 tablet 0   warfarin (COUMADIN ) 7.5 MG tablet TAKE 1 AND 1/2 TABS DAILY EXCEPT 2 TABS ON MONDAY, WEDNESDAY AND FRIDAY OR AS DIRECTED BY ANTICOAGULATION CLINIC 150 tablet 0   No current facility-administered medications on file prior to visit.   "

## 2024-02-24 NOTE — Assessment & Plan Note (Signed)
 Lab Results  Component Value Date   TSH 15.66 (H) 01/22/2024   Missed doses of levothyroxine  175 mcg Will get back on track  Reviewed how to take correctly  Re check 1 mo

## 2024-02-24 NOTE — Assessment & Plan Note (Signed)
 bp in fair control at this time  BP Readings from Last 1 Encounters:  02/24/24 126/74   No changes needed Most recent labs reviewed  Disc lifstyle change with low sodium diet and exercise  metoprool 50 mg bid  Lasix  20 mg daily    Weight loss would help

## 2024-02-24 NOTE — Assessment & Plan Note (Addendum)
 Reviewed health habits including diet and exercise and skin cancer prevention Reviewed appropriate screening tests for age  Also reviewed health mt list, fam hx and immunization status , as well as social and family history   See HPI Labs reviewed and ordered Health Maintenance  Topic Date Due   Hepatitis B Vaccine (1 of 3 - 19+ 3-dose series) Never done   Colon Cancer Screening  Never done   Breast Cancer Screening  06/23/2023   Flu Shot  05/03/2024*   Zoster (Shingles) Vaccine (1 of 2) 10/14/2024*   Pneumococcal Vaccine for age over 43 (1 of 2 - PCV) 01/13/2025*   COVID-19 Vaccine (5 - 2025-26 season) 03/11/2026*   Pap with HPV screening  06/24/2027   DTaP/Tdap/Td vaccine (3 - Td or Tdap) 02/23/2034   HPV Vaccine (No Doses Required) Completed   Hepatitis C Screening  Completed   HIV Screening  Completed   Meningitis B Vaccine  Aged Out  *Topic was postponed. The date shown is not the original due date.    Declines flu, pna, and shingrix vaccines  Td updated  Sent for pap/mammo reports Discussed fall prevention, supplements and exercise for bone density  PHQ 2 with treatment  Discussed weight loss struggles

## 2024-02-24 NOTE — Patient Instructions (Addendum)
 Add another 2000 international units of vitamin D3 over the counter daily  For bone and overall health   Work on an exercise when you can Strength training is most important Add some strength training to your routine, this is important for bone and brain health and can reduce your risk of falls and help your body use insulin  properly and regulate weight  Light weights, exercise bands , and internet videos are a good way to start  Yoga (chair or regular), machines , floor exercises or a gym with machines are also good options    Let's re check your TSH in a month  Don't miss doses   Move your B12 shot to every 8 weeks

## 2024-02-24 NOTE — Assessment & Plan Note (Signed)
 Last vitamin D  Lab Results  Component Value Date   VD25OH 21.84 (L) 01/22/2024    Instructed to add 2000 international units D3 to what she is already taking

## 2024-02-24 NOTE — Assessment & Plan Note (Signed)
 Normal cbc in dec  Takes bariatric vitamin with iron 

## 2024-02-24 NOTE — Assessment & Plan Note (Signed)
 Lab Results  Component Value Date   VITAMINB12 1,094 (H) 01/22/2024    Last vitamin D  Lab Results  Component Value Date   VD25OH 21.84 (L) 01/22/2024    Will increase D by 2000 international units daily  Decrease B12 to q 8 wk

## 2024-02-25 ENCOUNTER — Ambulatory Visit: Attending: Cardiology | Admitting: Student

## 2024-02-25 DIAGNOSIS — Z01810 Encounter for preprocedural cardiovascular examination: Secondary | ICD-10-CM | POA: Diagnosis not present

## 2024-02-25 NOTE — Telephone Encounter (Signed)
 Sounds good. Thanks

## 2024-02-25 NOTE — Progress Notes (Signed)
 "   Virtual Visit via Telephone Note   Because of [name]'s co-morbid illnesses, she is at least at moderate risk for complications without adequate follow up.  This format is felt to be most appropriate for this patient at this time.  The patient did not have access to video technology/had technical difficulties with video requiring transitioning to audio format only (telephone).  All issues noted in this document were discussed and addressed.  No physical exam could be performed with this format.  Please refer to the patient's chart for her consent to telehealth for [facility].  Evaluation Performed:  Preoperative cardiovascular risk assessment _____________   Date:  02/25/2024   Patient ID:  [name], DOB [birth year], MRN [id number] Patient Location:  Home Provider location:   Office  Primary Care Provider:  Randeen Laine DELENA, MD  Primary Cardiologist:  [facility] Providers Cardiologist:  Vina Gull, MD    Chief Complaint / Patient Profile   59 y.o. y/o female with a h/o nonobstructive CAD, aortic stenosis s/p AVR 2022, hypertension, hyperlipidemia, OSA on CPAP, hypothyroidism, SLE, CKD stage II, obesity s/p bariatric surgery, DVT/PE, factor V Leyden who is pending colonoscopy by Dr. Dianna and presents today for telephonic preoperative cardiovascular risk assessment.  History of Present Illness    [name] is a 59 y.o. female who presents via audio/video conferencing for a telehealth visit today.  Pt was last seen in cardiology clinic on 12/23/2023 by Delon Hoover, NP. She was having mild dyspnea and underwent echo which showed normal LV function. The patient is now pending procedure as outlined above. Since her last visit, she is doing well. She reports chronic dyspnea with exertion that is unchanged from previous. She has occasional lower extremity edema if she sits in a dependent position for prolonged periods of time that resolves by morning.  She has been sleeping with the head of the bed elevated for years secondary to lung issues. She reports an occasional twinge of discomfort in her chest that is brief and resolves with position changes that does not appear to cardiac in nature. She is active caring for her 12 year old grandson. She has been inconsistently participating in chair yoga since the beginning of the year sometimes up to twice a week.   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   HLD (hyperlipidemia)    Iron  deficiency anemia    IV dextran [place]   Nephritis and nephropathy, not specified as acute or chronic, with unspecified pathological lesion in kidney    Nonspecific abnormal results of liver function study    Obesity, unspecified    Other specified acquired hypothyroidism    Panic disorder without agoraphobia    Personal history of venous thrombosis and embolism 2002   during pregnancy; ?factor 5 leiden (sees heme)   Pleural effusion 2005   c/w lupus initial w/u; recurrent Right as pred tapered off July  2011   Sleep apnea    Systemic lupus erythematosus (HCC)    renal GN, hx of pericardial eff in late 90's   Unspecified essential hypertension    Past Surgical History:  Procedure Laterality Date   AORTIC VALVE REPLACEMENT N/A 04/06/2020   Procedure: AORTIC VALVE REPLACEMENT (AVR) USING INSPIRIS 21 MM AORTIC VALVE;  Surgeon: Lucas Dorise POUR, MD;  Location: [facility] OR;  Service: Open Heart Surgery;  Laterality: N/A;   BIOPSY  04/12/2019   Procedure: BIOPSY;   Surgeon: Eda Iha, MD;  Location: [facility] ENDOSCOPY;  Service: Gastroenterology;;   CARDIAC CATHETERIZATION     CARDIAC VALVE REPLACEMENT  [phone]   ESOPHAGEAL MANOMETRY N/A 07/15/2019   Procedure: ESOPHAGEAL MANOMETRY (EM);  Surgeon: Shila Gustav GAILS, MD;  Location: [facility] ENDOSCOPY;  Service: Endoscopy;  Laterality: N/A;   ESOPHAGOGASTRODUODENOSCOPY (EGD) WITH PROPOFOL  N/A 04/12/2019   Procedure: ESOPHAGOGASTRODUODENOSCOPY (EGD) WITH PROPOFOL ;  Surgeon: Eda Iha, MD;  Location: [facility] ENDOSCOPY;  Service: Gastroenterology;  Laterality: N/A;   gastric sleeve  09/04/2010   bariatric surgery Dr Dela    LAPAROSCOPIC TUBAL LIGATION  12/09/2010   Procedure: LAPAROSCOPIC TUBAL LIGATION;  Surgeon: Norleen GORMAN Skill;  Location: [facility] ORS;  Service: Gynecology;  Laterality: Bilateral;  Attempted see Nursing note   pleurocentesis  10/04/2004   Pleuryx catheter placement  10/04/2009   VanTright----removed 9/11   RENAL BIOPSY  09/24/2012   RIGHT/LEFT HEART CATH AND CORONARY ANGIOGRAPHY N/A 01/19/2020   Procedure: RIGHT/LEFT HEART CATH AND CORONARY ANGIOGRAPHY;  Surgeon: Court Dorn PARAS, MD;  Location: [facility] INVASIVE CV LAB;  Service: Cardiovascular;  Laterality: N/A;   svd     x 3   TEE WITHOUT CARDIOVERSION N/A 04/06/2020   Procedure: TRANSESOPHAGEAL ECHOCARDIOGRAM (TEE);  Surgeon: Lucas Dorise POUR, MD;  Location: [facility] OR;  Service: Open Heart Surgery;  Laterality: N/A;   THORACENTESIS  02/04/2008   with penumonia   WISDOM TOOTH EXTRACTION      Allergies  Allergies[1]  Home Medications    Prior to Admission medications  Medication Sig Start Date End Date Taking? Authorizing Provider  albuterol  (VENTOLIN  HFA) 108 (90 Base) MCG/ACT inhaler Inhale 1-2 puffs into the lungs every 6 (six) hours as needed for wheezing or shortness of breath. 07/30/23   Tower, Laine LABOR, MD  aspirin  EC 81 MG EC tablet Take 1 tablet (81 mg total) by mouth daily. Swallow whole. 04/11/20   Dwan Kyla HERO, PA-C   bismuth  subsalicylate (PEPTO BISMOL) 262 MG/15ML suspension Take 30 mLs by mouth every 6 (six) hours as needed for indigestion or diarrhea or loose stools.    [provider]  budesonide-formoterol (SYMBICORT) 160-4.5 MCG/ACT inhaler Inhale 2 puffs into the lungs 2 (two) times daily. 01/14/24   Hunsucker, Donnice SAUNDERS, MD  calcium  carbonate (TUMS - DOSED IN MG ELEMENTAL CALCIUM ) 500 MG chewable tablet Chew 1-2 tablets by mouth 3 (three) times daily as needed for indigestion or heartburn.    [provider]  chlorhexidine  (PERIDEX ) 0.12 % solution AFTER BRUSHING, RINSE WITH 1 CAPFUL FOR 1 MINUTE TWICE A DAY THEN SPIT OUT 11/11/23   [provider]  cholecalciferol (VITAMIN D3) 25 MCG (1000 UNIT) tablet Take 2,000 Units by mouth daily.    [provider]  cyanocobalamin  (VITAMIN B12) 1000 MCG/ML injection Inject 1 mL (1,000 mcg total) into the muscle every 6 (six) weeks. Inject 1 mL (1,000 mcg total) into the muscle every 8 (eight) weeks 02/24/24   Tower, Laine LABOR, MD  cyclobenzaprine  (FLEXERIL ) 10 MG  tablet Take 1 tablet (10 mg total) by mouth 3 (three) times daily as needed. 12/18/22   Gladis Elsie BROCKS, PA-C  ezetimibe  (ZETIA ) 10 MG tablet TAKE 1 TABLET BY MOUTH EVERY DAY 12/14/23   Tower, Laine LABOR, MD  furosemide  (LASIX ) 20 MG tablet Take 20 mg by mouth daily. 04/02/20   [provider]  hydroxychloroquine  (PLAQUENIL ) 200 MG tablet Take 200 mg by mouth 2 (two) times daily.    [provider]  levothyroxine  (SYNTHROID ) 175 MCG tablet TAKE 1 TABLET BY MOUTH EVERY DAY BEFORE BREAKFAST 01/08/24   Tower, Laine LABOR, MD  metoprolol  tartrate (LOPRESSOR ) 50 MG tablet TAKE 1 TABLET BY MOUTH TWICE A DAY 01/04/24   Okey Vina GAILS, MD  Multiple Vitamins-Minerals (BARIATRIC MULTIVITAMINS/IRON ) CAPS Take 1 tablet by mouth daily.    [provider]  omeprazole  (PRILOSEC) 40 MG capsule TAKE 1 CAPSULE (40 MG TOTAL) BY MOUTH DAILY. 01/14/24   Tower, Laine LABOR, MD   PARoxetine  (PAXIL ) 40 MG tablet TAKE 1 TABLET BY MOUTH EVERY DAY IN THE MORNING 01/14/24   Tower, Laine LABOR, MD  rosuvastatin  (CRESTOR ) 40 MG tablet TAKE 1 TABLET BY MOUTH EVERY DAY 10/09/23   Okey Vina GAILS, MD  warfarin (COUMADIN ) 7.5 MG tablet TAKE 1 AND 1/2 TABS DAILY EXCEPT 2 TABS ON MONDAY, WEDNESDAY AND FRIDAY OR AS DIRECTED BY ANTICOAGULATION CLINIC 02/02/24   Tower, Laine LABOR, MD    Physical Exam    Vital Signs:  MAYRANI KHAMIS does not have vital signs available for review today.  Given telephonic nature of communication, physical exam is limited. AAOx3. NAD. Normal affect.  Speech and respirations are unlabored.  Accessory Clinical Findings    Cardiac Studies & Procedures   ______________________________________________________________________________________________ CARDIAC CATHETERIZATION  CARDIAC CATHETERIZATION 01/19/2020  Conclusion Images from the original result were not included.   Hemodynamic findings consistent with aortic valve stenosis.  [name] is a 59 y.o. female   [id number] LOCATION:  FACILITY: [facility] PHYSICIAN: Dorn Lesches, M.D. [birth year]   DATE OF PROCEDURE:  02/14/2020  DATE OF DISCHARGE:     CARDIAC CATHETERIZATION    History obtained from chart review.ALMETER WESTHOFF is a 59 y.o. female with a history of moderate aortic stenosis on Echo in 11/2018, recurrent pleural effusion with superinfection on left s/p VATS and surgical pleurodesis in 2010, Factor V Leiden with prior PE/DVT on Coumadin , intracerebral hemorrhage, lupus with nephritis, hypertension, hyperlipidemia, GERD, hypothyroidism, and morbid obesity s/p gastric sleeve in 2012 who is being seen today for the evaluation of aortic stenosis at the request of Dr. Juvenal.  She presents today for right left heart cath to define her anatomy and physiology.   PROCEDURE DESCRIPTION:  The patient was brought to the second floor [facility] cath lab in the postabsorptive state.  She  was premedicated .  Her right wrist and antecubital fossa Were prepped and shaved in usual sterile fashion. Xylocaine  1% was used for local anesthesia. A 6 French sheath was inserted into the right radial artery using standard Seldinger technique.  A 5 French sheath was inserted into the right antecubital vein.  A 5 French balloontipped Swan-Ganz catheter was advanced through the right heart chambers obtaining sequential pressures and blood samples for the determination of Fick cardiac output.  S the patient received 4000 units  of heparin  intravenously.  5 French TIG catheter right Judkins catheters were used for selective coronary angiography and obtaining left heart pressures.  Isovue dye was used for the  entirety of the case.  Retrograde aorta, left ventricular and pullback pressures were recorded.  Radial cocktail was administered via the SideArm sheath.    Impression Ms. Rhudy has severe AS with normal coronary arteries.  She is being evaluated by Dr. Lucas for aortic valve replacement.  Dorn Lesches. MD, [facility] 02/14/2020 4:38 PM  Findings Coronary Findings Diagnostic  Dominance: Right  No diagnostic findings have been documented. Intervention  No interventions have been documented.     ECHOCARDIOGRAM  ECHOCARDIOGRAM COMPLETE 02/16/2024  Narrative ECHOCARDIOGRAM REPORT    Patient Name:   [name] Date of Exam: 02/16/2024 Medical Rec #:  [id number]      Height:       65.0 in Accession #:    [id number]     Weight:       368.1 lb Date of Birth:  [birth year]       BSA:          2.564 m Patient Age:    58 years       BP:           128/79 mmHg Patient Gender: F              HR:           83 bpm. Exam Location:  Outpatient  Procedure: 2D Echo, Cardiac Doppler and Color Doppler (Both Spectral and Color Flow Doppler were utilized during procedure).  Indications:     I35.0 Nonrheumatic aortic (valve) stenosis  History:         Patient has prior history of Echocardiogram  examinations, most recent 11/19/2021. Aortic Valve Disease. Aortic Valve: 21 mm Edwards INSPIRIS RESILIA pericardial valve is present in the aortic position. Procedure Date: 04/06/2020.  Sonographer:     Nathanel Devonshire Referring Phys:  [id number] DELON BROCKS WOODY Diagnosing Phys: Soyla Merck MD  IMPRESSIONS   1. Left ventricular ejection fraction, by estimation, is 70 to 75%. The left ventricle has hyperdynamic function. The left ventricle has no regional wall motion abnormalities. There is mild left ventricular hypertrophy. Left ventricular diastolic parameters are indeterminate. Elevated left atrial pressure. 2. Right ventricular systolic function is normal. The right ventricular size is not well visualized. 3. The mitral valve is degenerative. Trivial mitral valve regurgitation. Mild mitral stenosis. The mean mitral valve gradient is 5.0 mmHg with average heart rate of 75 bpm. Moderate mitral annular calcification. 4. Elevated gradients through aortic valve prosthesis likely due to by prosthesis patient mismatch, DVI 0.49. Gradients grossly stable since last exam. AT/ET 0.26, iEOA 0.69. The aortic valve has been repaired/replaced. Aortic valve regurgitation is not visualized. There is a 21 mm Edwards INSPIRIS RESILIA pericardial valve present in the aortic position. Procedure Date: 04/06/2020. Aortic valve area, by VTI measures 1.69 cm. Aortic valve mean gradient measures 19.0 mmHg. Aortic valve Vmax measures 2.98 m/s. Aortic valve acceleration time measures 82 msec.  FINDINGS Left Ventricle: Left ventricular ejection fraction, by estimation, is 70 to 75%. The left ventricle has hyperdynamic function. The left ventricle has no regional wall motion abnormalities. The left ventricular internal cavity size was normal in size. There is mild left ventricular hypertrophy. Left ventricular diastolic function could not be evaluated due to mitral annular calcification (moderate or greater). Left ventricular  diastolic parameters are indeterminate. Elevated left atrial pressure.  Right Ventricle: The right ventricular size is not well visualized. Right vetricular wall thickness was not well visualized. Right ventricular systolic function is normal.  Left Atrium: Left atrial size was normal in  size.  Right Atrium: Right atrial size was normal in size.  Pericardium: There is no evidence of pericardial effusion.  Mitral Valve: The mitral valve is degenerative in appearance. Moderate mitral annular calcification. Trivial mitral valve regurgitation. Mild mitral valve stenosis. MV peak gradient, 10.0 mmHg. The mean mitral valve gradient is 5.0 mmHg with average heart rate of 75 bpm.  Tricuspid Valve: The tricuspid valve is normal in structure. Tricuspid valve regurgitation is trivial. No evidence of tricuspid stenosis.  Aortic Valve: Elevated gradients through aortic valve prosthesis likely due to by prosthesis patient mismatch, DVI 0.49. Gradients grossly stable since last exam. AT/ET 0.26, iEOA 0.69. The aortic valve has been repaired/replaced. Aortic valve regurgitation is not visualized. Aortic valve mean gradient measures 19.0 mmHg. Aortic valve peak gradient measures 35.4 mmHg. Aortic valve area, by VTI measures 1.69 cm. There is a 21 mm Edwards INSPIRIS RESILIA pericardial valve present in the aortic position. Procedure Date: 04/06/2020.  Pulmonic Valve: The pulmonic valve was grossly normal. Pulmonic valve regurgitation is trivial. No evidence of pulmonic stenosis.  Aorta: The aortic root is normal in size and structure.  Venous: The inferior vena cava was not well visualized.  IAS/Shunts: No atrial level shunt detected by color flow Doppler.   LEFT VENTRICLE PLAX 2D LVIDd:         2.60 cm     Diastology LVIDs:         2.80 cm     LV e' medial:    10.70 cm/s LV PW:         2.60 cm     LV E/e' medial:  13.3 LV IVS:        1.30 cm     LV e' lateral:   8.49 cm/s LVOT diam:     2.10 cm      LV E/e' lateral: 16.7 LV SV:         104 LV SV Index:   41 LVOT Area:     3.46 cm LV IVRT:       70 msec  LV Volumes (MOD) LV vol d, MOD A2C: 93.5 ml LV vol d, MOD A4C: 80.0 ml LV vol s, MOD A2C: 26.9 ml LV vol s, MOD A4C: 25.1 ml LV SV MOD A2C:     66.6 ml LV SV MOD A4C:     80.0 ml LV SV MOD BP:      61.9 ml  RIGHT VENTRICLE RV Basal diam:  3.50 cm  PULMONARY VEINS TAPSE (M-mode): 1.8 cm   Diastolic Velocity: 71.20 cm/s S/D Velocity:       1.00 Systolic Velocity:  68.40 cm/s  LEFT ATRIUM             Index        RIGHT ATRIUM           Index LA diam:        3.90 cm 1.52 cm/m   RA Area:     13.60 cm LA Vol (A2C):   49.2 ml 19.19 ml/m  RA Volume:   34.20 ml  13.34 ml/m LA Vol (A4C):   42.2 ml 16.46 ml/m LA Biplane Vol: 46.8 ml 18.26 ml/m AORTIC VALVE                     PULMONIC VALVE AV Area (Vmax):    1.57 cm      PV Vmax:       1.43 m/s AV Area (Vmean):   1.45 cm  PV Peak grad:  8.2 mmHg AV Area (VTI):     1.69 cm AV Vmax:           297.53 cm/s AV Vmean:          206.726 cm/s AV VTI:            0.616 m AV Peak Grad:      35.4 mmHg AV Mean Grad:      19.0 mmHg LVOT Vmax:         135.00 cm/s LVOT Vmean:        86.800 cm/s LVOT VTI:          0.301 m LVOT/AV VTI ratio: 0.49  AORTA Ao Root diam: 2.90 cm Ao Asc diam:  3.10 cm  MITRAL VALVE                TRICUSPID VALVE MV Area (PHT): 5.27 cm     TR Peak grad:   36.0 mmHg MV Peak grad:  10.0 mmHg    TR Vmax:        300.00 cm/s MV Mean grad:  5.0 mmHg MV Vmax:       1.58 m/s     SHUNTS MV Vmean:      104.0 cm/s   Systemic VTI:  0.30 m MV Decel Time: 144 msec     Systemic Diam: 2.10 cm MV E velocity: 142.00 cm/s MV A velocity: 122.00 cm/s MV E/A ratio:  1.16  Soyla Merck MD Electronically signed by Soyla Merck MD Signature Date/Time: 02/16/2024/3:52:57 PM    Final (Updated)     ______________________________________________________________________________________________        Assessment & Plan    Preoperative cardiovascular risk assessment. Colonoscopy by Dr. Dianna on 03/07/2024.   Chart reviewed as part of pre-operative protocol coverage. According to the RCRI, patient has a 0.9% risk of MACE. Patient reports activity equivalent to 4.73 METS (per DASI).   Given past medical history and time since last visit, based on ACC/AHA guidelines, TACARA HADLOCK would be at acceptable risk for the planned procedure without further cardiovascular testing.   Patient was advised that if she develops new symptoms prior to surgery to contact our office to arrange a follow-up appointment.  she verbalized understanding.  Per Pharm D, patient  may hold Coumadin  for 5 days prior to procedure.  Patient will need bridging with Lovenox  around procedure.  INR is managed by PCP. It is noted that patient is allergic to Lovenox  and will not be able to be bridged.   I will route this recommendation to the requesting party via Epic fax function.  Please call with questions.  Time:   Today, I have spent 6 minutes with the patient with telehealth technology discussing medical history, symptoms, and management plan.     Barnie Hila, NP  02/25/2024, 8:23 AM     [1]  Allergies Allergen Reactions   Lisinopril  Cough   Lovenox  [Enoxaparin  Sodium] Itching   Moxifloxacin Other (See Comments)    REACTION: hallucinations   Lovenox  [Enoxaparin ]     Itching    "

## 2024-02-25 NOTE — Telephone Encounter (Signed)
 Warfarin dosing around colonoscopy surgery on 2/2.  Current warfarin dosing is 11.25 mg daily (1 1/2 tablets) daily  Pt uses a 7.5 mg tablet size.  1/28: Take last dose of warfarin 1/29: NO warfarin 1/30: NO warfarin 1/31: NO warfarin 2/1: NO warfarin  2/2: SURGERY; NO wafrarin  2/3: Take 2 1/2 tablets (18.75 mg) warfarin 2/4: Take 2 tablets (15 mg) warfarin 2/5: Take 2 1/2 tablets (18.75 mg) warfarin 2/6: Return to prior dosing of 1 1/2 tablets (11.25 mg) daily and recheck INR on 2/9

## 2024-02-29 ENCOUNTER — Ambulatory Visit: Payer: Self-pay

## 2024-02-29 DIAGNOSIS — Z7901 Long term (current) use of anticoagulants: Secondary | ICD-10-CM

## 2024-02-29 LAB — POCT INR: INR: 2.1 (ref 2.0–3.0)

## 2024-02-29 NOTE — Progress Notes (Signed)
 Pt tests INR at home.  Pt scheduled for colonoscopy for 2/2. Pt would normally be placed on a Lovenox  bridge but pt is allergic to Lovenox .   Increase dose today to take 2 tablets and then continue 1 1/2 tablets daily until starting instructions below. 1/28: Take last dose of warfarin 1/29: NO warfarin 1/30: NO warfarin 1/31: NO warfarin 2/1: NO warfarin   2/2: SURGERY; NO warfarin  2/3: Take 2 1/2 tablets (18.75 mg) warfarin 2/4: Take 2 tablets (15 mg) warfarin 2/5: Take 2 1/2 tablets (18.75 mg) warfarin 2/6: Return to prior dosing of 1 1/2 tablets (11.25 mg) daily and recheck INR on 2/9  Contacted pt by phone and advised of dosing and recheck date. Advised instructions would be sent via mychart. Advised if any questions or concerns to contact the coumadin  clinic. Pt verbalized understanding.

## 2024-02-29 NOTE — Patient Instructions (Addendum)
 Pre visit review using our clinic review tool, if applicable. No additional management support is needed unless otherwise documented below in the visit note.  Increase dose today to take 2 tablets and then continue 1 1/2 tablets daily until starting instructions below. 1/28: Take last dose of warfarin 1/29: NO warfarin 1/30: NO warfarin 1/31: NO warfarin 2/1: NO warfarin   2/2: SURGERY; NO warfarin  2/3: Take 2 1/2 tablets (18.75 mg) warfarin 2/4: Take 2 tablets (15 mg) warfarin 2/5: Take 2 1/2 tablets (18.75 mg) warfarin 2/6: Return to prior dosing of 1 1/2 tablets (11.25 mg) daily and recheck INR on 2/9

## 2024-03-07 ENCOUNTER — Encounter (HOSPITAL_COMMUNITY): Admission: RE | Payer: Self-pay

## 2024-03-07 ENCOUNTER — Ambulatory Visit (HOSPITAL_COMMUNITY): Admission: RE | Admit: 2024-03-07 | Admitting: Gastroenterology

## 2024-03-10 ENCOUNTER — Ambulatory Visit (HOSPITAL_COMMUNITY)

## 2024-03-11 ENCOUNTER — Ambulatory Visit: Payer: Self-pay

## 2024-03-11 DIAGNOSIS — Z7901 Long term (current) use of anticoagulants: Secondary | ICD-10-CM

## 2024-03-11 LAB — POCT INR: INR: 2.1 (ref 2.0–3.0)

## 2024-03-11 NOTE — Patient Instructions (Addendum)
 Pre visit review using our clinic review tool, if applicable. No additional management support is needed unless otherwise documented below in the visit note.  Increase dose today and tomorrow to take 2 tablets and then change weekly dose to take 1 1/2 tablets daily except take 2 tablets on Monday and Thursday. Recheck in 1 week.

## 2024-03-11 NOTE — Progress Notes (Signed)
 Pt tests INR at home.  Pt reports she started D3+K2. K2 interacts with warfarin and will reduce effectiveness of anticoagulation. There is 200 mcg of vitamin K2  in the vitamin D  tablet.  Pt cancelled her colonoscopy scheduled for 2/2 due to weather and road conditions. She has not RS it yet.  Pt contacted coumadin  clinic on 1/30 and requested to restart her warfarin. She was advised to 1/30: Take 2 1/2 tablets warfarin 1/31: Take 2 tablets warfarin 2/1: Take 2 1/2 tablets warfarin 2/2: Restart prior dosing which is 1 1/2 tablets daily  Increase dose today and tomorrow to take 2 tablets and then change weekly dose to take 1 1/2 tablets daily except take 2 tablets on Monday and Thursday. Recheck in 1 week.  Advised to continue the vitamin D  tablet with K2 and we will assess INR in one week.   Contacted pt by phone and she requested dosing instructions sent via mychart. Pt verbalized understanding.  Sent mychart msg with dosing instructions.

## 2024-03-25 ENCOUNTER — Other Ambulatory Visit

## 2024-04-13 ENCOUNTER — Encounter

## 2024-04-13 ENCOUNTER — Ambulatory Visit: Admitting: Pulmonary Disease
